# Patient Record
Sex: Female | Born: 1935
Health system: Southern US, Community
[De-identification: ages and names within clinical notes are randomized; demographics above are authoritative.]

## PROBLEM LIST (undated history)

## (undated) DIAGNOSIS — R55 Syncope and collapse: Secondary | ICD-10-CM

## (undated) DIAGNOSIS — I10 Essential (primary) hypertension: Secondary | ICD-10-CM

## (undated) DIAGNOSIS — C50919 Malignant neoplasm of unspecified site of unspecified female breast: Secondary | ICD-10-CM

## (undated) DIAGNOSIS — K5732 Diverticulitis of large intestine without perforation or abscess without bleeding: Secondary | ICD-10-CM

## (undated) DIAGNOSIS — E785 Hyperlipidemia, unspecified: Secondary | ICD-10-CM

## (undated) DIAGNOSIS — K449 Diaphragmatic hernia without obstruction or gangrene: Secondary | ICD-10-CM

## (undated) DIAGNOSIS — M8580 Other specified disorders of bone density and structure, unspecified site: Secondary | ICD-10-CM

## (undated) DIAGNOSIS — I2699 Other pulmonary embolism without acute cor pulmonale: Secondary | ICD-10-CM

## (undated) DIAGNOSIS — M1990 Unspecified osteoarthritis, unspecified site: Secondary | ICD-10-CM

## (undated) DIAGNOSIS — E039 Hypothyroidism, unspecified: Secondary | ICD-10-CM

## (undated) DIAGNOSIS — R918 Other nonspecific abnormal finding of lung field: Secondary | ICD-10-CM

## (undated) DIAGNOSIS — D649 Anemia, unspecified: Secondary | ICD-10-CM

## (undated) HISTORY — PX: BREAST BIOPSY: SHX20

## (undated) HISTORY — DX: Essential (primary) hypertension: I10

## (undated) HISTORY — DX: Diaphragmatic hernia without obstruction or gangrene: K44.9

## (undated) HISTORY — DX: Unspecified osteoarthritis, unspecified site: M19.90

## (undated) HISTORY — DX: Hypothyroidism, unspecified: E03.9

## (undated) HISTORY — DX: Other specified disorders of bone density and structure, unspecified site: M85.80

## (undated) HISTORY — DX: Syncope and collapse: R55

## (undated) HISTORY — DX: Hyperlipidemia, unspecified: E78.5

## (undated) HISTORY — PX: TONSILLECTOMY AND ADENOIDECTOMY: SUR1326

## (undated) HISTORY — DX: Diverticulitis of large intestine without perforation or abscess without bleeding: K57.32

## (undated) HISTORY — DX: Malignant neoplasm of unspecified site of unspecified female breast: C50.919

## (undated) NOTE — *Deleted (*Deleted)
Thoracic Location of Tumor / Histology:  LEFT upper lobe pulmonary nodule   Patient presented with symptoms of:  Patient had a recent hospitalization for intussusception.  Patient found to have a colon lipoma causing intussusception.  She was taken to the operating room for partial colectomy and anastomosis. She had bilateral pulmonary emboli pulmonary was consulted. Patient was trialed on anticoagulation during the hospitalization. Patient was discharged from the hospital on 05/06/2020. In addition she did have bilateral lower extremity DVT.  CT scan of the chest on admission revealed a 16 x 11 mm irregular solid density within the left upper lobe.  Ultimately the patient had hospital follow-up in our clinic at discharge with Elisha Headland, NP.  Recommended continued Eliquis for DVT/PE.  Planned noncontrasted CT chest follow-up to ensure resolution of lung nodule.  She had this repeat noncontrasted CT of the chest on 06/02/2020.  There was persistence of this opacity in the left lung apex measuring 0.9 x 2 cm in size.  Biopsies revealed:  None collected--per Dr. Tonia Brooms will determine at a later date if necessary  Tobacco/Marijuana/Snuff/ETOH use: ***  Past/Anticipated interventions by cardiothoracic surgery, if any:  06/26/2020 Dr. Elige Radon Icard Patient here today for follow-up regarding recent PET scan imaging of abnormal lung nodule.  This was completed and read yesterday.  Looking at the left upper lobe anterior pulmonary nodule is hypermetabolic with a SUV of 4.6.  Additionally there is a small AP window node that metabolic activity is present does not exclude metastatic disease.  Today we reviewed patient's patient's imaging as above.  Otherwise respiratory standpoint she has no complaints. Plan: --We discussed all of the different options today to include biopsy versus consideration for empiric radiation. --I think with the patient's medical history, age and to her overall goals of care I think we  should consider empiric radiation treatment. --Patient is agreeable to this plan if they would consider this from radiation oncology standpoint. --I do believe that if she needed to have a biopsy that she could possibly tolerate this but her son had a biopsy with a pneumothorax for a lung cancer she is a little anxious about having a procedure done. --She is also anxious about having to undergo another procedure at her current age. --Ambulatory referral to radiation oncology was placed.  Past/Anticipated interventions by medical oncology, if any: No referral has been placed at this time  Signs/Symptoms  Weight changes, if any: ***  Respiratory complaints, if any: ***  Hemoptysis, if any: ***  Pain issues, if any:  ***  SAFETY ISSUES:  Prior radiation? ***  Pacemaker/ICD? ***   Possible current pregnancy? No--postmenopausal  Is the patient on methotrexate? ***  Current Complaints / other details:   History of left breast cancer-invasive ductal stage 1-stopped arimidex 2008

---

## 1999-10-10 ENCOUNTER — Emergency Department (HOSPITAL_COMMUNITY): Admission: EM | Admit: 1999-10-10 | Discharge: 1999-10-10 | Payer: Self-pay | Admitting: Internal Medicine

## 1999-10-10 ENCOUNTER — Encounter: Payer: Self-pay | Admitting: Internal Medicine

## 1999-10-23 ENCOUNTER — Other Ambulatory Visit: Admission: RE | Admit: 1999-10-23 | Discharge: 1999-10-23 | Payer: Self-pay | Admitting: *Deleted

## 1999-12-29 ENCOUNTER — Encounter: Payer: Self-pay | Admitting: Emergency Medicine

## 1999-12-29 ENCOUNTER — Emergency Department (HOSPITAL_COMMUNITY): Admission: EM | Admit: 1999-12-29 | Discharge: 1999-12-29 | Payer: Self-pay | Admitting: Emergency Medicine

## 2000-04-05 ENCOUNTER — Encounter: Payer: Self-pay | Admitting: Internal Medicine

## 2000-04-05 ENCOUNTER — Ambulatory Visit (HOSPITAL_COMMUNITY): Admission: RE | Admit: 2000-04-05 | Discharge: 2000-04-05 | Payer: Self-pay | Admitting: Internal Medicine

## 2000-04-09 ENCOUNTER — Encounter: Payer: Self-pay | Admitting: Emergency Medicine

## 2000-04-09 ENCOUNTER — Emergency Department (HOSPITAL_COMMUNITY): Admission: EM | Admit: 2000-04-09 | Discharge: 2000-04-09 | Payer: Self-pay | Admitting: Emergency Medicine

## 2002-03-26 ENCOUNTER — Other Ambulatory Visit: Admission: RE | Admit: 2002-03-26 | Discharge: 2002-03-26 | Payer: Self-pay | Admitting: Radiology

## 2002-03-26 ENCOUNTER — Encounter (INDEPENDENT_AMBULATORY_CARE_PROVIDER_SITE_OTHER): Payer: Self-pay | Admitting: *Deleted

## 2002-03-26 ENCOUNTER — Encounter: Admission: RE | Admit: 2002-03-26 | Discharge: 2002-03-26 | Payer: Self-pay | Admitting: Surgery

## 2002-03-26 ENCOUNTER — Encounter: Payer: Self-pay | Admitting: Surgery

## 2002-04-09 HISTORY — PX: BREAST LUMPECTOMY: SHX2

## 2002-04-10 ENCOUNTER — Encounter: Admission: RE | Admit: 2002-04-10 | Discharge: 2002-04-10 | Payer: Self-pay | Admitting: Surgery

## 2002-04-10 ENCOUNTER — Encounter: Payer: Self-pay | Admitting: Surgery

## 2002-04-11 ENCOUNTER — Ambulatory Visit (HOSPITAL_BASED_OUTPATIENT_CLINIC_OR_DEPARTMENT_OTHER): Admission: RE | Admit: 2002-04-11 | Discharge: 2002-04-11 | Payer: Self-pay | Admitting: Surgery

## 2002-04-11 ENCOUNTER — Encounter: Payer: Self-pay | Admitting: Surgery

## 2002-04-11 ENCOUNTER — Encounter: Admission: RE | Admit: 2002-04-11 | Discharge: 2002-04-11 | Payer: Self-pay | Admitting: Surgery

## 2002-04-11 ENCOUNTER — Encounter (INDEPENDENT_AMBULATORY_CARE_PROVIDER_SITE_OTHER): Payer: Self-pay | Admitting: *Deleted

## 2002-04-27 ENCOUNTER — Ambulatory Visit: Admission: RE | Admit: 2002-04-27 | Discharge: 2002-07-03 | Payer: Self-pay | Admitting: *Deleted

## 2002-08-14 ENCOUNTER — Ambulatory Visit: Admission: RE | Admit: 2002-08-14 | Discharge: 2002-08-14 | Payer: Self-pay | Admitting: *Deleted

## 2003-08-10 DIAGNOSIS — R55 Syncope and collapse: Secondary | ICD-10-CM

## 2003-08-10 HISTORY — DX: Syncope and collapse: R55

## 2003-09-23 ENCOUNTER — Inpatient Hospital Stay (HOSPITAL_COMMUNITY): Admission: EM | Admit: 2003-09-23 | Discharge: 2003-09-24 | Payer: Self-pay | Admitting: Emergency Medicine

## 2004-07-03 ENCOUNTER — Ambulatory Visit: Payer: Self-pay | Admitting: Internal Medicine

## 2004-07-16 ENCOUNTER — Ambulatory Visit: Payer: Self-pay | Admitting: Hematology & Oncology

## 2004-12-21 ENCOUNTER — Ambulatory Visit: Payer: Self-pay | Admitting: Internal Medicine

## 2005-01-15 ENCOUNTER — Ambulatory Visit: Payer: Self-pay | Admitting: Hematology & Oncology

## 2005-08-20 ENCOUNTER — Ambulatory Visit: Payer: Self-pay | Admitting: Hematology & Oncology

## 2006-02-28 ENCOUNTER — Ambulatory Visit: Payer: Self-pay | Admitting: Hematology & Oncology

## 2006-03-04 LAB — COMPREHENSIVE METABOLIC PANEL
ALT: 12 U/L (ref 0–40)
AST: 19 U/L (ref 0–37)
Albumin: 4 g/dL (ref 3.5–5.2)
Calcium: 9.1 mg/dL (ref 8.4–10.5)
Chloride: 101 mEq/L (ref 96–112)
Potassium: 4.2 mEq/L (ref 3.5–5.3)
Total Protein: 6.1 g/dL (ref 6.0–8.3)

## 2006-03-04 LAB — CBC WITH DIFFERENTIAL/PLATELET
BASO%: 0.2 % (ref 0.0–2.0)
Basophils Absolute: 0 10*3/uL (ref 0.0–0.1)
EOS%: 1.4 % (ref 0.0–7.0)
HGB: 14 g/dL (ref 11.6–15.9)
MCH: 31.7 pg (ref 26.0–34.0)
MCHC: 34.2 g/dL (ref 32.0–36.0)
MONO#: 0.6 10*3/uL (ref 0.1–0.9)
RDW: 13.3 % (ref 11.3–14.5)
WBC: 5.5 10*3/uL (ref 3.9–10.0)
lymph#: 1.2 10*3/uL (ref 0.9–3.3)

## 2006-03-07 ENCOUNTER — Ambulatory Visit: Payer: Self-pay | Admitting: Internal Medicine

## 2006-03-08 ENCOUNTER — Encounter: Admission: RE | Admit: 2006-03-08 | Discharge: 2006-03-08 | Payer: Self-pay | Admitting: Internal Medicine

## 2006-06-14 ENCOUNTER — Ambulatory Visit: Payer: Self-pay | Admitting: Internal Medicine

## 2006-06-14 LAB — CONVERTED CEMR LAB
HDL: 49.5 mg/dL (ref >39.0)
Triglyceride fasting, serum: 77 mg/dL (ref 0–149)

## 2006-08-30 ENCOUNTER — Ambulatory Visit: Payer: Self-pay | Admitting: Hematology & Oncology

## 2006-10-06 LAB — CBC WITH DIFFERENTIAL/PLATELET
BASO%: 0.1 % (ref 0.0–2.0)
EOS%: 1.1 % (ref 0.0–7.0)
MCH: 31.7 pg (ref 26.0–34.0)
MCHC: 34.9 g/dL (ref 32.0–36.0)
NEUT%: 67.6 % (ref 39.6–76.8)
RBC: 4.46 10*6/uL (ref 3.70–5.32)
RDW: 13.3 % (ref 11.3–14.5)
lymph#: 1.2 10*3/uL (ref 0.9–3.3)

## 2006-10-06 LAB — COMPREHENSIVE METABOLIC PANEL
ALT: 11 U/L (ref 0–35)
AST: 18 U/L (ref 0–37)
Calcium: 9.4 mg/dL (ref 8.4–10.5)
Chloride: 106 mEq/L (ref 96–112)
Creatinine, Ser: 0.73 mg/dL (ref 0.40–1.20)
Potassium: 3.8 mEq/L (ref 3.5–5.3)
Sodium: 144 mEq/L (ref 135–145)

## 2007-01-30 DIAGNOSIS — E785 Hyperlipidemia, unspecified: Secondary | ICD-10-CM | POA: Insufficient documentation

## 2007-01-30 DIAGNOSIS — I10 Essential (primary) hypertension: Secondary | ICD-10-CM

## 2007-01-30 DIAGNOSIS — E039 Hypothyroidism, unspecified: Secondary | ICD-10-CM

## 2007-01-30 DIAGNOSIS — M1990 Unspecified osteoarthritis, unspecified site: Secondary | ICD-10-CM | POA: Insufficient documentation

## 2007-06-09 ENCOUNTER — Encounter (INDEPENDENT_AMBULATORY_CARE_PROVIDER_SITE_OTHER): Payer: Self-pay | Admitting: *Deleted

## 2007-06-13 ENCOUNTER — Encounter: Payer: Self-pay | Admitting: Internal Medicine

## 2007-06-23 ENCOUNTER — Encounter (INDEPENDENT_AMBULATORY_CARE_PROVIDER_SITE_OTHER): Payer: Self-pay | Admitting: *Deleted

## 2007-09-21 ENCOUNTER — Telehealth (INDEPENDENT_AMBULATORY_CARE_PROVIDER_SITE_OTHER): Payer: Self-pay | Admitting: *Deleted

## 2007-10-03 ENCOUNTER — Ambulatory Visit: Payer: Self-pay | Admitting: Hematology & Oncology

## 2007-10-05 ENCOUNTER — Encounter: Payer: Self-pay | Admitting: Internal Medicine

## 2007-10-05 LAB — COMPREHENSIVE METABOLIC PANEL
ALT: 9 U/L (ref 0–35)
AST: 18 U/L (ref 0–37)
Albumin: 4.1 g/dL (ref 3.5–5.2)
BUN: 14 mg/dL (ref 6–23)
Calcium: 9.4 mg/dL (ref 8.4–10.5)
Chloride: 100 mEq/L (ref 96–112)
Potassium: 3.9 mEq/L (ref 3.5–5.3)
Total Protein: 6.4 g/dL (ref 6.0–8.3)

## 2007-10-05 LAB — CBC WITH DIFFERENTIAL/PLATELET
Basophils Absolute: 0 10*3/uL (ref 0.0–0.1)
EOS%: 1.8 % (ref 0.0–7.0)
HGB: 14.4 g/dL (ref 11.6–15.9)
MCH: 32 pg (ref 26.0–34.0)
NEUT#: 3.6 10*3/uL (ref 1.5–6.5)
RDW: 13.3 % (ref 11.3–14.5)
lymph#: 1.5 10*3/uL (ref 0.9–3.3)

## 2007-10-20 ENCOUNTER — Ambulatory Visit: Payer: Self-pay | Admitting: Internal Medicine

## 2007-10-20 DIAGNOSIS — M810 Age-related osteoporosis without current pathological fracture: Secondary | ICD-10-CM | POA: Insufficient documentation

## 2007-10-20 DIAGNOSIS — Z853 Personal history of malignant neoplasm of breast: Secondary | ICD-10-CM

## 2007-10-24 ENCOUNTER — Encounter (INDEPENDENT_AMBULATORY_CARE_PROVIDER_SITE_OTHER): Payer: Self-pay | Admitting: *Deleted

## 2007-10-26 ENCOUNTER — Ambulatory Visit: Payer: Self-pay | Admitting: Internal Medicine

## 2007-10-31 LAB — CONVERTED CEMR LAB
ALT: 12 units/L (ref 0–35)
AST: 21 units/L (ref 0–37)
BUN: 10 mg/dL (ref 6–23)
Basophils Relative: 0.5 % (ref 0.0–1.0)
Calcium: 9.1 mg/dL (ref 8.4–10.5)
Chloride: 101 meq/L (ref 96–112)
Eosinophils Absolute: 0.1 10*3/uL (ref 0.0–0.6)
Eosinophils Relative: 1.1 % (ref 0.0–5.0)
GFR calc non Af Amer: 86 mL/min
Glucose, Bld: 90 mg/dL (ref 70–99)
MCV: 94.1 fL (ref 78.0–100.0)
Monocytes Relative: 10.1 % (ref 3.0–11.0)
Platelets: 240 10*3/uL (ref 150–400)
RBC: 4.71 M/uL (ref 3.87–5.11)
TSH: 4.63 microintl units/mL (ref 0.35–5.50)
Total CHOL/HDL Ratio: 3.3
Triglycerides: 77 mg/dL (ref 0–149)
VLDL: 15 mg/dL (ref 0–40)
WBC: 5.7 10*3/uL (ref 4.5–10.5)

## 2007-11-03 ENCOUNTER — Encounter: Payer: Self-pay | Admitting: Internal Medicine

## 2007-11-03 ENCOUNTER — Ambulatory Visit: Payer: Self-pay | Admitting: Internal Medicine

## 2007-12-03 ENCOUNTER — Encounter (INDEPENDENT_AMBULATORY_CARE_PROVIDER_SITE_OTHER): Payer: Self-pay | Admitting: *Deleted

## 2007-12-06 ENCOUNTER — Telehealth (INDEPENDENT_AMBULATORY_CARE_PROVIDER_SITE_OTHER): Payer: Self-pay | Admitting: *Deleted

## 2007-12-11 ENCOUNTER — Ambulatory Visit: Payer: Self-pay | Admitting: Internal Medicine

## 2007-12-11 DIAGNOSIS — M549 Dorsalgia, unspecified: Secondary | ICD-10-CM | POA: Insufficient documentation

## 2008-01-22 ENCOUNTER — Telehealth (INDEPENDENT_AMBULATORY_CARE_PROVIDER_SITE_OTHER): Payer: Self-pay | Admitting: *Deleted

## 2008-02-21 ENCOUNTER — Ambulatory Visit: Payer: Self-pay | Admitting: Internal Medicine

## 2008-02-22 ENCOUNTER — Telehealth (INDEPENDENT_AMBULATORY_CARE_PROVIDER_SITE_OTHER): Payer: Self-pay | Admitting: *Deleted

## 2008-02-23 ENCOUNTER — Encounter (INDEPENDENT_AMBULATORY_CARE_PROVIDER_SITE_OTHER): Payer: Self-pay | Admitting: *Deleted

## 2008-02-26 ENCOUNTER — Ambulatory Visit: Payer: Self-pay | Admitting: Internal Medicine

## 2008-02-26 LAB — CONVERTED CEMR LAB: OCCULT 3: NEGATIVE

## 2008-02-27 ENCOUNTER — Encounter (INDEPENDENT_AMBULATORY_CARE_PROVIDER_SITE_OTHER): Payer: Self-pay | Admitting: *Deleted

## 2008-06-13 ENCOUNTER — Encounter: Payer: Self-pay | Admitting: Internal Medicine

## 2008-06-26 ENCOUNTER — Encounter (INDEPENDENT_AMBULATORY_CARE_PROVIDER_SITE_OTHER): Payer: Self-pay | Admitting: *Deleted

## 2008-08-29 ENCOUNTER — Ambulatory Visit: Payer: Self-pay | Admitting: Internal Medicine

## 2008-09-02 ENCOUNTER — Telehealth: Payer: Self-pay | Admitting: Internal Medicine

## 2008-09-02 LAB — CONVERTED CEMR LAB
ALT: 13 units/L (ref 0–35)
AST: 23 units/L (ref 0–37)
Chloride: 103 meq/L (ref 96–112)
Direct LDL: 151.9 mg/dL
GFR calc Af Amer: 89 mL/min
GFR calc non Af Amer: 74 mL/min
HDL: 60.7 mg/dL (ref >39.0)
Hemoglobin: 15.1 g/dL — ABNORMAL HIGH (ref 12.0–15.0)
Potassium: 3.8 meq/L (ref 3.5–5.1)
Sodium: 142 meq/L (ref 135–145)
Total CHOL/HDL Ratio: 3.6
VLDL: 14 mg/dL (ref 0–40)

## 2008-10-12 ENCOUNTER — Telehealth: Payer: Self-pay | Admitting: Internal Medicine

## 2008-10-12 ENCOUNTER — Ambulatory Visit: Payer: Self-pay | Admitting: Internal Medicine

## 2008-10-14 ENCOUNTER — Telehealth (INDEPENDENT_AMBULATORY_CARE_PROVIDER_SITE_OTHER): Payer: Self-pay | Admitting: *Deleted

## 2008-11-11 ENCOUNTER — Ambulatory Visit: Payer: Self-pay | Admitting: Hematology & Oncology

## 2008-11-13 ENCOUNTER — Encounter: Payer: Self-pay | Admitting: Internal Medicine

## 2008-11-13 LAB — CBC WITH DIFFERENTIAL (CANCER CENTER ONLY)
BASO#: 0 10*3/uL (ref 0.0–0.2)
EOS%: 1.9 % (ref 0.0–7.0)
HGB: 14.2 g/dL (ref 11.6–15.9)
MCH: 31.6 pg (ref 26.0–34.0)
MCHC: 33.5 g/dL (ref 32.0–36.0)
MONO%: 8.6 % (ref 0.0–13.0)
NEUT#: 3.2 10*3/uL (ref 1.5–6.5)
NEUT%: 65.5 % (ref 39.6–80.0)

## 2008-11-13 LAB — COMPREHENSIVE METABOLIC PANEL
Albumin: 3.9 g/dL (ref 3.5–5.2)
BUN: 11 mg/dL (ref 6–23)
Calcium: 9 mg/dL (ref 8.4–10.5)
Chloride: 101 mEq/L (ref 96–112)
Glucose, Bld: 79 mg/dL (ref 70–99)
Potassium: 3.8 mEq/L (ref 3.5–5.3)

## 2009-03-03 ENCOUNTER — Ambulatory Visit: Payer: Self-pay | Admitting: Internal Medicine

## 2009-03-04 ENCOUNTER — Encounter (INDEPENDENT_AMBULATORY_CARE_PROVIDER_SITE_OTHER): Payer: Self-pay | Admitting: *Deleted

## 2009-03-06 LAB — CONVERTED CEMR LAB
Basophils Absolute: 0.1 10*3/uL (ref 0.0–0.1)
Basophils Relative: 1.1 % (ref 0.0–3.0)
CO2: 32 meq/L (ref 19–32)
Calcium: 9.1 mg/dL (ref 8.4–10.5)
Chloride: 104 meq/L (ref 96–112)
Creatinine, Ser: 0.8 mg/dL (ref 0.4–1.2)
Eosinophils Absolute: 0.1 10*3/uL (ref 0.0–0.7)
Glucose, Bld: 86 mg/dL (ref 70–99)
HDL: 54 mg/dL (ref >39.00)
Lymphocytes Relative: 26.9 % (ref 12.0–46.0)
MCHC: 34.3 g/dL (ref 30.0–36.0)
MCV: 92.3 fL (ref 78.0–100.0)
Monocytes Absolute: 0.5 10*3/uL (ref 0.1–1.0)
Neutro Abs: 2.7 10*3/uL (ref 1.4–7.7)
Neutrophils Relative %: 59.6 % (ref 43.0–77.0)
RBC: 4.36 M/uL (ref 3.87–5.11)
RDW: 12.8 % (ref 11.5–14.6)
TSH: 2.09 microintl units/mL (ref 0.35–5.50)
Total CHOL/HDL Ratio: 3
Triglycerides: 56 mg/dL (ref 0.0–149.0)

## 2009-03-07 ENCOUNTER — Ambulatory Visit: Payer: Self-pay | Admitting: Internal Medicine

## 2009-03-14 ENCOUNTER — Encounter (INDEPENDENT_AMBULATORY_CARE_PROVIDER_SITE_OTHER): Payer: Self-pay | Admitting: *Deleted

## 2009-03-14 LAB — CONVERTED CEMR LAB: Fecal Occult Bld: NEGATIVE

## 2009-06-20 ENCOUNTER — Encounter: Payer: Self-pay | Admitting: Internal Medicine

## 2009-07-07 ENCOUNTER — Ambulatory Visit: Payer: Self-pay | Admitting: Internal Medicine

## 2009-07-10 LAB — CONVERTED CEMR LAB
Calcium: 9.2 mg/dL (ref 8.4–10.5)
GFR calc non Af Amer: 85.74 mL/min (ref >60)
Glucose, Bld: 83 mg/dL (ref 70–99)
Potassium: 4 meq/L (ref 3.5–5.1)
Sodium: 140 meq/L (ref 135–145)

## 2009-11-10 ENCOUNTER — Ambulatory Visit: Payer: Self-pay | Admitting: Hematology & Oncology

## 2009-11-12 ENCOUNTER — Encounter: Payer: Self-pay | Admitting: Internal Medicine

## 2009-11-12 LAB — CBC WITH DIFFERENTIAL (CANCER CENTER ONLY)
BASO#: 0 10*3/uL (ref 0.0–0.2)
Eosinophils Absolute: 0.1 10*3/uL (ref 0.0–0.5)
HCT: 41.1 % (ref 34.8–46.6)
HGB: 13.3 g/dL (ref 11.6–15.9)
LYMPH#: 1.3 10*3/uL (ref 0.9–3.3)
LYMPH%: 26.7 % (ref 14.0–48.0)
MCV: 89 fL (ref 81–101)
MONO#: 0.4 10*3/uL (ref 0.1–0.9)
NEUT%: 63.1 % (ref 39.6–80.0)
WBC: 5 10*3/uL (ref 3.9–10.0)

## 2009-11-12 LAB — VITAMIN D 25 HYDROXY (VIT D DEFICIENCY, FRACTURES): Vit D, 25-Hydroxy: 27 ng/mL — ABNORMAL LOW (ref 30–89)

## 2009-11-12 LAB — COMPREHENSIVE METABOLIC PANEL
ALT: 8 U/L (ref 0–35)
BUN: 13 mg/dL (ref 6–23)
CO2: 28 mEq/L (ref 19–32)
Calcium: 9.1 mg/dL (ref 8.4–10.5)
Chloride: 101 mEq/L (ref 96–112)
Creatinine, Ser: 0.75 mg/dL (ref 0.40–1.20)
Glucose, Bld: 79 mg/dL (ref 70–99)
Total Bilirubin: 0.4 mg/dL (ref 0.3–1.2)

## 2010-01-06 ENCOUNTER — Telehealth (INDEPENDENT_AMBULATORY_CARE_PROVIDER_SITE_OTHER): Payer: Self-pay | Admitting: *Deleted

## 2010-01-06 ENCOUNTER — Ambulatory Visit: Payer: Self-pay | Admitting: Internal Medicine

## 2010-01-26 ENCOUNTER — Ambulatory Visit: Payer: Self-pay | Admitting: Internal Medicine

## 2010-01-26 ENCOUNTER — Encounter: Payer: Self-pay | Admitting: Internal Medicine

## 2010-03-09 ENCOUNTER — Ambulatory Visit: Payer: Self-pay | Admitting: Internal Medicine

## 2010-03-09 LAB — CONVERTED CEMR LAB
ALT: 13 units/L (ref 0–35)
BUN: 12 mg/dL (ref 6–23)
Basophils Absolute: 0 10*3/uL (ref 0.0–0.1)
Calcium: 9.2 mg/dL (ref 8.4–10.5)
Chloride: 103 meq/L (ref 96–112)
Creatinine, Ser: 0.7 mg/dL (ref 0.4–1.2)
Eosinophils Relative: 1 % (ref 0.0–5.0)
HDL: 61.6 mg/dL (ref >39.00)
Hemoglobin: 14.9 g/dL (ref 12.0–15.0)
LDL Cholesterol: 94 mg/dL (ref 0–99)
Lymphocytes Relative: 20.8 % (ref 12.0–46.0)
Monocytes Relative: 9.3 % (ref 3.0–12.0)
Platelets: 236 10*3/uL (ref 150.0–400.0)
RDW: 14.8 % — ABNORMAL HIGH (ref 11.5–14.6)
TSH: 1.5 microintl units/mL (ref 0.35–5.50)
Total CHOL/HDL Ratio: 3
Triglycerides: 71 mg/dL (ref 0.0–149.0)
Vit D, 25-Hydroxy: 42 ng/mL (ref 30–89)
WBC: 5.8 10*3/uL (ref 4.5–10.5)

## 2010-03-10 ENCOUNTER — Telehealth: Payer: Self-pay | Admitting: Internal Medicine

## 2010-03-11 ENCOUNTER — Encounter (INDEPENDENT_AMBULATORY_CARE_PROVIDER_SITE_OTHER): Payer: Self-pay | Admitting: *Deleted

## 2010-03-16 ENCOUNTER — Ambulatory Visit: Payer: Self-pay | Admitting: Internal Medicine

## 2010-05-28 ENCOUNTER — Ambulatory Visit: Payer: Self-pay | Admitting: Internal Medicine

## 2010-06-22 ENCOUNTER — Encounter: Payer: Self-pay | Admitting: Internal Medicine

## 2010-06-23 ENCOUNTER — Encounter: Payer: Self-pay | Admitting: Internal Medicine

## 2010-08-31 ENCOUNTER — Ambulatory Visit
Admission: RE | Admit: 2010-08-31 | Discharge: 2010-08-31 | Payer: Self-pay | Source: Home / Self Care | Attending: Family Medicine | Admitting: Family Medicine

## 2010-09-06 LAB — CONVERTED CEMR LAB: TSH: 4.91 microintl units/mL (ref 0.35–5.50)

## 2010-09-08 NOTE — Assessment & Plan Note (Signed)
Summary: CPX   //lch   Vital Signs:  Patient profile:   77 year old female Weight:      148.25 pounds Pulse rate:   85 / minute Pulse rhythm:   regular BP sitting:   134 / 82  (left arm) Cuff size:   regular  Vitals Entered By: Army Fossa CMA (March 09, 2010 8:43 AM) CC: Pt here for Follow up visit: fasting   History of Present Illness: Here for Medicare AWV:  1.   Risk factors based on Past M, S, F history: yes  2.   Physical Activities: active , independent on ADL, still drives  3.   Depression/mood: denies depressive moods 4.   Hearing: R hearing decreased , has not seen the specialist in a while , does not feel that needs evaluation 5.   ADL's: independent on all her ADLs 6.   Fall Risk: no recent falls, low risk  7.   Home Safety: feels safe at home , has  fire detectors  8.   Height, weight, &visual acuity:see VS, no problems with vision 9.   Counseling: provided , see below 10.   Labs ordered based on risk factors: yes  11.           Referral Coordination 12.           Care Plan--- see A/P  13.            Cognitive Assessment :  memory, alertness and motor skill seems appropriate for age   Hyperlipidemia-- tolerates crestor well, off simvastatin x weeks and aches and pains are  decreased  Hypertension-- no ambulatory BPs good med compliance  Osteoarthritis-- aches and pains  better since  simvastatin was switched to Crestor  Osteopenia-- good medication compliance w/  fosamax w/o s/e      Current Medications (verified): 1)  Hydrochlorothiazide 25 Mg Tabs (Hydrochlorothiazide) .... Take One Tablet Daily 2)  Levothyroxine Sodium 75 Mcg Tabs (Levothyroxine Sodium) .Marland Kitchen.. 1 By Mouth Qd 3)  Crestor 10 Mg Tabs (Rosuvastatin Calcium) .Marland Kitchen.. 1 By Mouth Once Daily 4)  Alendronate Sodium 70 Mg Tabs (Alendronate Sodium) .Marland Kitchen.. 1 By Mouth Qwk 5)  Aspir-Low 81 Mg  Tbec (Aspirin) 6)  Vit D 1000 To 1200 U Daily  and  Calcium Every Day 7)  Tylenol Extra Strength 500 Mg Tabs  (Acetaminophen) .Marland Kitchen.. 1 or 2 Every 6 Hours As Needed For Pain 8)  Fish Oil 9)  Turmeric  Allergies: 1)  ! Motrin  Past History:  Past Medical History: Hyperlipidemia Hypertension Hypothyroidism Osteoarthritis Hiatal Hernia Left breast-invasive ductal ca stage 1-- stopped arimidex 2008 Diverticulosis, colon Syncope 2005: CT head (-), ECHO essent.  neg, stress test (-), carotid u/s (-) Osteopenia DEXA 7-02 Cscope 2002, TICs next 2012  Past Surgical History: Reviewed history from 01/30/2007 and no changes required. T&A Vaginal deliveries x 2 Colonoscopy-04/06/2001 DXA 02/2001 ABI 03/26/2002 Lumpectomy- Left breast-04/2002  Family History: Reviewed history from 07/07/2009 and no changes required. mother pass away  age 3  Diabetes -- M . dx late in life  MI-- B   at age 41 CVA-- F , age 45 . sister 42 colon ca--no Breast ca--no  Social History: Reviewed history from 03/03/2009 and no changes required. son lives w/ her Mikael Spray , one of my patients) Widow since 1993 still drives ADL independent Never Smoked Alcohol use-no  Review of Systems CV:  Denies chest pain or discomfort and swelling of feet. Resp:  Denies shortness  of breath; occasionally coughs. GI:  Denies bloody stools, diarrhea, nausea, and vomiting; no heartburn. GU:  no vag bleed  does not do SBE. Psych:  Denies anxiety and depression.  Physical Exam  General:  alert, well-developed, and well-nourished.   Neck:  no masses, no thyromegaly, and normal carotid upstroke.   Breasts:  No mass, nodules, thickening, tenderness, bulging, retraction, inflamation, nipple discharge or skin changes noted.  surgical scar on the left breast noted Lungs:  normal respiratory effort, no intercostal retractions, no accessory muscle use, and normal breath sounds.   Heart:  normal rate, regular rhythm, and no murmur.   Abdomen:  soft, non-tender, no distention, no masses, no guarding, and no rigidity.   Extremities:   trace  edema superficial varicosities L>R calf tender to palpation throughout w/o redness (at baseline) Psych:  Oriented X3, good eye contact, not anxious appearing, and not depressed appearing.     Impression & Recommendations:  Problem # 1:  HEALTH SCREENING (ICD-V70.0)  Td 2008 pneumonia shot 02-2008 discussed  shingles shot , will think about it   MMG 11-10 neg  PAP 2001 (-) , no h/o hysterectomy, no h/o abnormal PAPs ----> pt not interested on further PAPs, she is low risk SBE encouraged   Cscope  2002, was rec next 2012 neg  iFOB 7-10, another kit provided  Diet and exercise discussed  Orders: First annual wellness visit with prevention plan  (Z6109)  Problem # 2:  OSTEOPENIA (ICD-733.90) tolerating alendronate well Her updated medication list for this problem includes:    Alendronate Sodium 70 Mg Tabs (Alendronate sodium) .Marland Kitchen... 1 by mouth qwk  Orders: T-Vitamin D (25-Hydroxy) (60454-09811) Specimen Handling (91478)  Problem # 3:  HYPOTHYROIDISM (ICD-244.9)  checking a TSH Her updated medication list for this problem includes:    Levothyroxine Sodium 75 Mcg Tabs (Levothyroxine sodium) .Marland Kitchen... 1 by mouth qd  Labs Reviewed: TSH: 1.74 (07/07/2009)    Chol: 168 (03/03/2009)   HDL: 54.00 (03/03/2009)   LDL: 103 (03/03/2009)   TG: 56.0 (03/03/2009)  Orders: TLB-TSH (Thyroid Stimulating Hormone) (84443-TSH) Specimen Handling (29562)  Problem # 4:  HYPERTENSION (ICD-401.9) at goal Her updated medication list for this problem includes:    Hydrochlorothiazide 25 Mg Tabs (Hydrochlorothiazide) .Marland Kitchen... Take one tablet daily  Orders: Venipuncture (13086) TLB-BMP (Basic Metabolic Panel-BMET) (80048-METABOL) TLB-CBC Platelet - w/Differential (85025-CBCD) Specimen Handling (57846)  BP today: 134/82 Prior BP: 124/82 (01/06/2010)  Labs Reviewed: K+: 4.0 (07/07/2009) Creat: : 0.7 (07/07/2009)   Chol: 168 (03/03/2009)   HDL: 54.00 (03/03/2009)   LDL: 103 (03/03/2009)    TG: 56.0 (03/03/2009)  Problem # 5:  HYPERLIPIDEMIA (ICD-272.4) off simvastatin, aches and pains better. Continue with crestor  Labs Her updated medication list for this problem includes:    Crestor 10 Mg Tabs (Rosuvastatin calcium) .Marland Kitchen... 1 by mouth once daily  Orders: TLB-ALT (SGPT) (84460-ALT) TLB-AST (SGOT) (84450-SGOT) TLB-Lipid Panel (80061-LIPID)  Complete Medication List: 1)  Hydrochlorothiazide 25 Mg Tabs (Hydrochlorothiazide) .... Take one tablet daily 2)  Levothyroxine Sodium 75 Mcg Tabs (Levothyroxine sodium) .Marland Kitchen.. 1 by mouth qd 3)  Crestor 10 Mg Tabs (Rosuvastatin calcium) .Marland Kitchen.. 1 by mouth once daily 4)  Alendronate Sodium 70 Mg Tabs (Alendronate sodium) .Marland Kitchen.. 1 by mouth qwk 5)  Aspir-low 81 Mg Tbec (Aspirin) 6)  Vit D 1000 To 1200 U Daily and Calcium Every Day  7)  Tylenol Extra Strength 500 Mg Tabs (Acetaminophen) .Marland Kitchen.. 1 or 2 every 6 hours as needed for pain 8)  Fish  Oil  9)  Turmeric   Patient Instructions: 1)  Please schedule a follow-up appointment in 6 months .

## 2010-09-08 NOTE — Letter (Signed)
[facility]   Imported By: Lanelle Bal 12/05/2009 11:37:48  _____________________________________________________________________  External Attachment:    Type:   Image     Comment:   External Document
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
## 2010-09-08 NOTE — Miscellaneous (Signed)
Summary: BONE DENSITY  Clinical Lists Changes  Orders: Added new Test order of T-Bone Densitometry (77080) - Signed Added new Test order of T-Lumbar Vertebral Assessment (77082) - Signed 

## 2010-09-08 NOTE — Progress Notes (Signed)
Summary: Refill Request  Phone Note Refill Request Call back at [phone] Message from:  Pharmacy on Jan 06, 2010 12:35 PM  Refills Requested: Medication #1:  ALENDRONATE SODIUM 70 MG TABS 1 by mouth qwk   Dosage confirmed as above?Dosage Confirmed   Supply Requested: 1 month   Last Refilled: 12/10/2009 [facility] on [address]  Next Appointment Scheduled: 8.1.11 Initial call taken by: Harold Barban,  Jan 06, 2010 12:35 PM    Prescriptions: ALENDRONATE SODIUM 70 MG TABS (ALENDRONATE SODIUM) 1 by mouth qwk  #4 Each x 2   Entered by:   Kandice Hams   Authorized by:   Nolon Rod. Paz MD   Signed by:   Kandice Hams on 01/06/2010   Method used:   Faxed to ...       [facility] [phone]* (retail)       [address]       [place], [place]  [address]       Ph: [phone]       Fax: [phone]   RxID:   [id number]

## 2010-09-08 NOTE — Assessment & Plan Note (Signed)
Summary: 6 mth fu/ns/kdc   Vital Signs:  Patient profile:   77 year old female Height:      62.5 inches Weight:      149.4 pounds O2 Sat:      95 % on Room air Pulse rate:   89 / minute BP sitting:   124 / 82  Vitals Entered By: Shary Decamp (Jan 06, 2010 9:57 AM)  O2 Flow:  Room air CC: rov, pt is only taking simvastatin 3-4 times a week c/o of "hurting so bad in knees, hip"   History of Present Illness: ROV  Hyperlipidemia--  has decreased dose of statin d/t pain in the joints Pain is mostly at the hips and knees, no worse with walking but worse when she "turns in  bed" Denies fevers, headaches, shoulder pain Thinks that the pain has decreased after lowering the dose of statins No myalgias per se  Hypertension-- no ambulatory BPs  , good medication compliance   Hypothyroidism-- good medication compliance    Left breast-invasive ductal cancer --seen by oncology 11-12-09, note reviewed, stable, followup in one year    Current Medications (verified): 1)  Hydrochlorothiazide 25 Mg Tabs (Hydrochlorothiazide) .... Take One Tablet Daily 2)  Levothyroxine Sodium 75 Mcg Tabs (Levothyroxine Sodium) .Marland Kitchen.. 1 By Mouth Qd 3)  Simvastatin 80 Mg Tabs (Simvastatin) .Marland Kitchen.. 1 By Mouth Qd 4)  Alendronate Sodium 70 Mg Tabs (Alendronate Sodium) .Marland Kitchen.. 1 By Mouth Qwk 5)  Aspir-Low 81 Mg  Tbec (Aspirin) 6)  Vit D 1000 To 1200 U Daily  and  Calcium Every Day 7)  Tylenol 325 Mg Tabs (Acetaminophen) 8)  Fish Oil 9)  Turmeric  Allergies (verified): 1)  ! Motrin  Social History: Reviewed history from 03/03/2009 and no changes required. son lives w/ her Mikael Spray , one of my patients) Widow since 1993 still drives ADL independent Never Smoked Alcohol use-no  Review of Systems General:  admits to high fat diet . CV:  Denies chest pain or discomfort and swelling of feet. Resp:  Denies cough and shortness of breath. GI:  Denies bloody stools, diarrhea, nausea, and vomiting.  Physical  Exam  General:  alert and well-developed.   Lungs:  normal respiratory effort, no intercostal retractions, no accessory muscle use, and normal breath sounds.   Heart:  normal rate, regular rhythm, and no murmur.   Msk:  not tender to palpation at either trochanteric bursa Extremities:  trace  edema superficial varicosities L>R calf tender to palpation throughout w/o redness (at baseline) Psych:  not anxious appearing and not depressed appearing.     Impression & Recommendations:  Problem # 1:  HYPERLIPIDEMIA (ICD-272.4) see HPI, unclear if pain related to statins or OA Plan: Hold simvastatin Crestor 10 mg daily, samples provided Her updated medication list for this problem includes:    Crestor 10 Mg Tabs (Rosuvastatin calcium) .Marland Kitchen... 1 by mouth once daily  Labs Reviewed: SGOT: 24 (07/07/2009)   SGPT: 13 (07/07/2009)   HDL:54.00 (03/03/2009), 60.7 (08/29/2008)  LDL:103 (03/03/2009), DEL (08/29/2008)  Chol:168 (03/03/2009), 221 (08/29/2008)  Trig:56.0 (03/03/2009), 70 (08/29/2008)  Orders: Prescription Created Electronically [id number])  Problem # 2:  HYPERTENSION (ICD-401.9) at goal  Her updated medication list for this problem includes:    Hydrochlorothiazide 25 Mg Tabs (Hydrochlorothiazide) .Marland Kitchen... Take one tablet daily  BP today: 124/82 Prior BP: 128/70 (07/07/2009)  Labs Reviewed: K+: 4.0 (07/07/2009) Creat: : 0.7 (07/07/2009)   Chol: 168 (03/03/2009)   HDL: 54.00 (03/03/2009)   LDL: 103 (03/03/2009)  TG: 56.0 (03/03/2009)  Problem # 3:  HYPOTHYROIDISM (ICD-244.9) no change Her updated medication list for this problem includes:    Levothyroxine Sodium 75 Mcg Tabs (Levothyroxine sodium) .Marland Kitchen... 1 by mouth qd  Labs Reviewed: TSH: 1.74 (07/07/2009)    Chol: 168 (03/03/2009)   HDL: 54.00 (03/03/2009)   LDL: 103 (03/03/2009)   TG: 56.0 (03/03/2009)  Problem # 4:  OSTEOARTHRITIS (ICD-715.90) see HPI, will encourage routine Tylenol use, seen instructions.   Her updated  medication list for this problem includes:    Aspir-low 81 Mg Tbec (Aspirin)    Tylenol Extra Strength 500 Mg Tabs (Acetaminophen) .Marland Kitchen... 1 or 2 every 6 hours as needed for pain  Complete Medication List: 1)  Hydrochlorothiazide 25 Mg Tabs (Hydrochlorothiazide) .... Take one tablet daily 2)  Levothyroxine Sodium 75 Mcg Tabs (Levothyroxine sodium) .Marland Kitchen.. 1 by mouth qd 3)  Crestor 10 Mg Tabs (Rosuvastatin calcium) .Marland Kitchen.. 1 by mouth once daily 4)  Alendronate Sodium 70 Mg Tabs (Alendronate sodium) .Marland Kitchen.. 1 by mouth qwk 5)  Aspir-low 81 Mg Tbec (Aspirin) 6)  Vit D 1000 To 1200 U Daily and Calcium Every Day  7)  Tylenol Extra Strength 500 Mg Tabs (Acetaminophen) .Marland Kitchen.. 1 or 2 every 6 hours as needed for pain 8)  Fish Oil  9)  Turmeric   Patient Instructions: 1)  discontinue simvastatin 2)  Start Crestor 10 mg one daily 3)  Please come back in 6-8 weeks, fasting, yearly checkup. 4)  Take tylenol 500 mg  1 or 2 tabs every 6 hours as needed for relief of pain. Avoid taking more than 4000 mg in a 24 hour period (can cause liver damage in higher doses).  Prescriptions: CRESTOR 10 MG TABS (ROSUVASTATIN CALCIUM) 1 by mouth once daily  #30 x 6   Entered and Authorized by:   Elita Quick E. Mickeal Daws MD   Signed by:   Nolon Rod. Hila Bolding MD on 01/06/2010   Method used:   Electronically to        [facility] [phone]* (retail)       [address]       [place], [place]  [address]       Ph: [phone]       Fax: [phone]   RxID:   [id number] CRESTOR 10 MG TABS (ROSUVASTATIN CALCIUM) 1 by mouth once daily  #30 x 6   Entered and Authorized by:   Nolon Rod. Jillane Po MD   Signed by:   Nolon Rod. Haset Oaxaca MD on 01/06/2010   Method used:   Print then Give to Patient   RxID:   [id number]

## 2010-09-08 NOTE — Letter (Signed)
Summary: [facility]: Immunoassay Fecal Occult Blood (iFOB) Order Form  [facility] [place]  [address] [place], [place] [address]   Phone: [phone]  Fax: [phone]      [facility]: Immunoassay Fecal Occult Blood (iFOB) Order Form   March 11, 2010 MRN: [id number]   Ariel Medina 04-11-1928   Physicican Name:______jose paz,md___________________  Diagnosis Code:_______v76.51___________________      Army Fossa CMA

## 2010-09-09 ENCOUNTER — Ambulatory Visit (INDEPENDENT_AMBULATORY_CARE_PROVIDER_SITE_OTHER): Payer: Medicare Other | Admitting: Internal Medicine

## 2010-09-09 ENCOUNTER — Encounter: Payer: Self-pay | Admitting: Internal Medicine

## 2010-09-09 ENCOUNTER — Ambulatory Visit: Admit: 2010-09-09 | Payer: Self-pay | Admitting: Internal Medicine

## 2010-09-09 DIAGNOSIS — E039 Hypothyroidism, unspecified: Secondary | ICD-10-CM

## 2010-09-09 DIAGNOSIS — E785 Hyperlipidemia, unspecified: Secondary | ICD-10-CM

## 2010-09-09 DIAGNOSIS — I10 Essential (primary) hypertension: Secondary | ICD-10-CM

## 2010-09-16 NOTE — Assessment & Plan Note (Signed)
Summary: muscle spasms/kn paz pt needed early appt/kn   Vital Signs:  Patient profile:   77 year old female Height:      62.5 inches Weight:      151 pounds O2 Sat:      95 % on Room air Temp:     97.3 degrees F oral Pulse rate:   81 / minute BP sitting:   100 / 68  (left arm)  Vitals Entered By: Jeremy Johann CMA (August 31, 2010 9:17 AM)  O2 Flow:  Room air CC: muscle spasm in back x 2 days   History of Present Illness: 77 yo woman here today for muscle spasm.  this is not new for pt.  Saturday was cleaning and by Saturday evening was 'hurting so bad'.  reports that hydrocodone 'makes me goofy'- took 3 tabs on Saturday.  yesterday took 1/2 tab w/out side effects.  pain will migrate but primarily over lower back.  used a heating pad yesterday.  Current Medications (verified): 1)  Hydrochlorothiazide 25 Mg Tabs (Hydrochlorothiazide) .... Take One Tablet Daily 2)  Levothyroxine Sodium 75 Mcg Tabs (Levothyroxine Sodium) .Marland Kitchen.. 1 By Mouth Qd 3)  Crestor 10 Mg Tabs (Rosuvastatin Calcium) .Marland Kitchen.. 1 By Mouth Once Daily 4)  Alendronate Sodium 70 Mg Tabs (Alendronate Sodium) .Marland Kitchen.. 1 By Mouth Qwk 5)  Aspir-Low 81 Mg  Tbec (Aspirin) 6)  Vit D 1000 To 1200 U Daily  and  Calcium Every Day 7)  Tylenol Extra Strength 500 Mg Tabs (Acetaminophen) .Marland Kitchen.. 1 or 2 Every 6 Hours As Needed For Pain 8)  Fish Oil 9)  Turmeric 10)  Hydrocodone-Acetaminophen 5-500 Mg Tabs (Hydrocodone-Acetaminophen) .Marland Kitchen.. 1 or 2 By Mouth Every 8 Hours As Needed For Pain, Watch For Drowsines  Allergies (verified): 1)  ! Motrin  Review of Systems      See HPI  Physical Exam  General:  alert and well-developed.   Msk:  no TTP over spine, + paraspinal tenderness bilaterally w/ some spasm.  (-) SLR bilaterally Pulses:  +2 PT Extremities:  no C/C/E   Impression & Recommendations:  Problem # 1:  BACK PAIN (ICD-724.5) Assessment Unchanged  pt's pain consistent w/ muscle pain.  since she is having side effects from  hydrocodone recommended she take plain tylenol and use the muscle relaxer in order to get comfortable.  cautioned her that this may cause drowsiness.  pt to call if no improvement. Her updated medication list for this problem includes:    Aspir-low 81 Mg Tbec (Aspirin)    Tylenol Extra Strength 500 Mg Tabs (Acetaminophen) .Marland Kitchen... 1 or 2 every 6 hours as needed for pain    Hydrocodone-acetaminophen 5-500 Mg Tabs (Hydrocodone-acetaminophen) .Marland Kitchen... 1 or 2 by mouth every 8 hours as needed for pain, watch for drowsines    Cyclobenzaprine Hcl 5 Mg Tabs (Cyclobenzaprine hcl) .Marland Kitchen... 1 tab by mouth three times a day as needed for muscle spasm- may cause drowsiness  Orders: Prescription Created Electronically [phone])  Complete Medication List: 1)  Hydrochlorothiazide 25 Mg Tabs (Hydrochlorothiazide) .... Take one tablet daily 2)  Levothyroxine Sodium 75 Mcg Tabs (Levothyroxine sodium) .Marland Kitchen.. 1 by mouth qd 3)  Crestor 10 Mg Tabs (Rosuvastatin calcium) .Marland Kitchen.. 1 by mouth once daily 4)  Alendronate Sodium 70 Mg Tabs (Alendronate sodium) .Marland Kitchen.. 1 by mouth qwk 5)  Aspir-low 81 Mg Tbec (Aspirin) 6)  Vit D 1000 To 1200 U Daily and Calcium Every Day  7)  Tylenol Extra Strength 500 Mg Tabs (Acetaminophen) .Marland KitchenMarland KitchenMarland Kitchen 1  or 2 every 6 hours as needed for pain 8)  Fish Oil  9)  Turmeric  10)  Hydrocodone-acetaminophen 5-500 Mg Tabs (Hydrocodone-acetaminophen) .Marland Kitchen.. 1 or 2 by mouth every 8 hours as needed for pain, watch for drowsines 11)  Cyclobenzaprine Hcl 5 Mg Tabs (Cyclobenzaprine hcl) .Marland Kitchen.. 1 tab by mouth three times a day as needed for muscle spasm- may cause drowsiness  Patient Instructions: 1)  Take cyclobenzaprine as needed for muscle spasm- this may make you sleepy 2)  Tylenol as needed for pain 3)  Use the heating pad for pain relief but don't use it on bare skin- make sure you have a shirt on 4)  Call if you aren't feeling any better 5)  Hang in there! Prescriptions: CYCLOBENZAPRINE HCL 5 MG TABS (CYCLOBENZAPRINE  HCL) 1 tab by mouth three times a day as needed for muscle spasm- may cause drowsiness  #30 x 0   Entered and Authorized by:   Neena Rhymes MD   Signed by:   Neena Rhymes MD on 08/31/2010   Method used:   Electronically to        [facility] [phone]* (retail)       [address]       [place], [place]  [address]       Ph: [phone]       Fax: [phone]   RxID:   [id number]    Orders Added: 1)  Est. Patient Level III [24401] 2)  Prescription Created Electronically [phone]

## 2010-09-16 NOTE — Assessment & Plan Note (Signed)
Summary: 6 month ROV   Vital Signs:  Patient profile:   77 year old female Weight:      147.50 pounds Pulse rate:   86 / minute Pulse rhythm:   regular BP sitting:   126 / 82  (left arm) Cuff size:   regular  Vitals Entered By: Army Fossa CMA (September 09, 2010 8:10 AM) CC: 6 month f/u- fasting  Comments Not always taking Crestor- causes pain in Knees and Hips [facility] [address]   History of Present Illness: routine office visit   she was seen recently with back pain, doing better No major concerns today, emotionally she is doing well. His son drinks alcohol and lately that problem has been quiet   ROS She is self decreased Crestor to 2 or 3 times a week  due  to knee pain, symptoms better, wonders if she could drop crestor to  3 times a weekonly  Denies chest pain or shortness of breath no lower extremity edema Good medication compliance with all her medicines    Current Medications (verified): 1)  Hydrochlorothiazide 25 Mg Tabs (Hydrochlorothiazide) .... Take One Tablet Daily 2)  Levothyroxine Sodium 75 Mcg Tabs (Levothyroxine Sodium) .Marland Kitchen.. 1 By Mouth Qd 3)  Crestor 10 Mg Tabs (Rosuvastatin Calcium) .Marland Kitchen.. 1 By Mouth Once Daily 4)  Alendronate Sodium 70 Mg Tabs (Alendronate Sodium) .Marland Kitchen.. 1 By Mouth Qwk 5)  Aspir-Low 81 Mg  Tbec (Aspirin) 6)  Vit D 1000 To 1200 U Daily  and  Calcium Every Day 7)  Tylenol Extra Strength 500 Mg Tabs (Acetaminophen) .Marland Kitchen.. 1 or 2 Every 6 Hours As Needed For Pain 8)  Fish Oil 9)  Turmeric  Allergies (verified): 1)  ! Motrin  Past History:  Past Medical History: Reviewed history from 03/09/2010 and no changes required. Hyperlipidemia Hypertension Hypothyroidism Osteoarthritis Hiatal Hernia Left breast-invasive ductal ca stage 1-- stopped arimidex 2008 Diverticulosis, colon Syncope 2005: CT head (-), ECHO essent.  neg, stress test (-), carotid u/s (-) Osteopenia DEXA 7-02 Cscope 2002, TICs next 2012  Past Surgical  History: Reviewed history from 01/30/2007 and no changes required. T&A Vaginal deliveries x 2 Colonoscopy-04/06/2001 DXA 02/2001 ABI 03/26/2002 Lumpectomy- Left breast-04/2002  Social History: Reviewed history from 03/03/2009 and no changes required. 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  good spirits Lungs:  normal respiratory effort, no intercostal retractions, no accessory muscle use, and normal breath sounds.   Heart:  normal rate, regular rhythm, and no murmur.   Extremities:  no lower extremity edema   Impression & Recommendations:  Problem # 1:  BACK PAIN (ICD-724.5)  resolved   Problem # 2:  HYPOTHYROIDISM (ICD-244.9) no change Her updated medication list for this problem includes:    Levothyroxine Sodium 75 Mcg Tabs (Levothyroxine sodium) .Marland Kitchen... 1 by mouth qd  Labs Reviewed: TSH: 1.50 (03/09/2010)    Chol: 170 (03/09/2010)   HDL: 61.60 (03/09/2010)   LDL: 94 (03/09/2010)   TG: 71.0 (03/09/2010)  Problem # 3:  HYPERTENSION (ICD-401.9) at goal  Her updated medication list for this problem includes:    Hydrochlorothiazide 25 Mg Tabs (Hydrochlorothiazide) .Marland Kitchen... Take one tablet daily  BP today: 126/82 Prior BP: 100/68 (08/31/2010)  Labs Reviewed: K+: 4.1 (03/09/2010) Creat: : 0.7 (03/09/2010)   Chol: 170 (03/09/2010)   HDL: 61.60 (03/09/2010)   LDL: 94 (03/09/2010)   TG: 71.0 (  03/09/2010)  Problem # 4:  HYPERLIPIDEMIA (ICD-272.4) daily Crestor was causing knee pain, feels better taking Crestor two or 3 times a week. I agreed to change Crestor "officially" to 3 times a week Her updated medication list for this problem includes:    Crestor 10 Mg Tabs (Rosuvastatin calcium) .Marland Kitchen... Take monday-wednesday-friday only  Labs Reviewed: SGOT: 22 (03/09/2010)   SGPT: 13 (03/09/2010)   HDL:61.60 (03/09/2010), 54.00 (03/03/2009)   LDL:94 (03/09/2010), 103 (45/40/9811)  Chol:170 (03/09/2010), 168 (03/03/2009)  Trig:71.0 (03/09/2010), 56.0 (03/03/2009)  Complete Medication List: 1)  Hydrochlorothiazide 25 Mg Tabs (Hydrochlorothiazide) .... Take one tablet daily 2)  Levothyroxine Sodium 75 Mcg Tabs (Levothyroxine sodium) .Marland Kitchen.. 1 by mouth qd 3)  Crestor 10 Mg Tabs (Rosuvastatin calcium) .... Take monday-wednesday-friday only 4)  Alendronate Sodium 70 Mg Tabs (Alendronate sodium) .Marland Kitchen.. 1 by mouth qwk 5)  Aspir-low 81 Mg Tbec (Aspirin) 6)  Vit D 1000 To 1200 U Daily and Calcium Every Day  7)  Tylenol Extra Strength 500 Mg Tabs (Acetaminophen) .Marland Kitchen.. 1 or 2 every 6 hours as needed for pain 8)  Fish Oil  9)  Turmeric   Patient Instructions: 1)  Please schedule a follow-up appointment in 6 months , fasting , physical   Orders Added: 1)  Est. Patient Level III [91478]

## 2010-10-31 ENCOUNTER — Other Ambulatory Visit: Payer: Self-pay | Admitting: Internal Medicine

## 2010-11-11 ENCOUNTER — Other Ambulatory Visit: Payer: Self-pay | Admitting: Hematology & Oncology

## 2010-11-11 ENCOUNTER — Encounter (HOSPITAL_BASED_OUTPATIENT_CLINIC_OR_DEPARTMENT_OTHER): Payer: Medicare Other | Admitting: Hematology & Oncology

## 2010-11-11 DIAGNOSIS — C50419 Malignant neoplasm of upper-outer quadrant of unspecified female breast: Secondary | ICD-10-CM

## 2010-11-11 DIAGNOSIS — Z853 Personal history of malignant neoplasm of breast: Secondary | ICD-10-CM

## 2010-11-11 DIAGNOSIS — E559 Vitamin D deficiency, unspecified: Secondary | ICD-10-CM

## 2010-11-11 LAB — CBC WITH DIFFERENTIAL (CANCER CENTER ONLY)
BASO#: 0 10*3/uL (ref 0.0–0.2)
EOS%: 1.6 % (ref 0.0–7.0)
HGB: 13.6 g/dL (ref 11.6–15.9)
LYMPH#: 1.3 10*3/uL (ref 0.9–3.3)
MCHC: 33.9 g/dL (ref 32.0–36.0)
MONO#: 0.5 10*3/uL (ref 0.1–0.9)
NEUT#: 3.2 10*3/uL (ref 1.5–6.5)
RBC: 4.55 10*6/uL (ref 3.70–5.32)
WBC: 5.1 10*3/uL (ref 3.9–10.0)

## 2010-11-12 LAB — COMPREHENSIVE METABOLIC PANEL
ALT: 8 U/L (ref 0–35)
AST: 20 U/L (ref 0–37)
Albumin: 4.1 g/dL (ref 3.5–5.2)
BUN: 12 mg/dL (ref 6–23)
CO2: 29 mEq/L (ref 19–32)
Calcium: 9.5 mg/dL (ref 8.4–10.5)
Chloride: 99 mEq/L (ref 96–112)
Creatinine, Ser: 0.91 mg/dL (ref 0.40–1.20)
Potassium: 4 mEq/L (ref 3.5–5.3)

## 2010-11-12 LAB — VITAMIN D 25 HYDROXY (VIT D DEFICIENCY, FRACTURES): Vit D, 25-Hydroxy: 40 ng/mL (ref 30–89)

## 2010-12-25 NOTE — Procedures (Signed)
[facility]  Patient:    Ariel Medina, Ariel Medina                      MRN: [id number] Adm. Date:  [id number] Attending:  Mervin Hack                           Procedure Report  PROCEDURE:  Endoscopic retrograde cholangiopancreatogram.  INDICATIONS:  This 77 year old white female was seen at [facility] Emergency Room on two occasions in the last several months with midabdominal and left upper quadrant abdominal pain radiating to her back.  She was evaluated for kidney stones with a CT scan of the abdomen and ultrasound which showed a small cyst in the left kidney.  Patient has a history of hyperlipidemia.  On physical exam on February 26, 2000, she had tenderness in the left upper quadrant suggesting possible pancreatitis.  Since then, patients symptoms have subsided on a low-fat diet.  Her liver function tests were normal.  She is undergoing ERCP to rule out possibility of pancreatic disease or abnormality of the pancreas.  ENDOSCOPIST:  Hedwig Morton. Juanda Chance, M.D.  ENDOSCOPE:  Olympus single-channel side-viewing duodenoscope.  SEDATION:  Versed 10 mg IV, Demerol 100 mg IV, Glucagon 1 mg IV.  FINDINGS:  Olympus single-channel side-viewing duodenoscope was passed blindly through the esophagus into the stomach.  There was a hiatal hernia in the distal esophagus which was partially reducible.  Gastric folds were normal, as was the gastric antrum.  Pyloric outlet was unremarkable.  Duodenal bulb was normal, distensible, with normal-appearing descending duodenum.  Papilla was located without difficulty and showed normal size.  There was no deformity. Bile was exuding from the papilla.  Main pancreatic duct was cannulated without difficulty and showed normal-appearing main pancreatic duct in the head, body and tail of the pancreas.  Secondary radicles spilled only in the tail.  The tail of the pancreas was normal.  There were no strictures, calcifications and  there was no evidence of pancreatic disease and attempts for filling of the common bile duct were unsuccessful because of repeated filling of the main pancreatic duct.  Patient tolerated the procedure well.  IMPRESSION 1. Normal endoscopic retrograde pancreatography, without cannulation of the    common bile duct. 2. Hiatal hernia.  PLAN:  Since patients symptoms have improved, she is to stay on a low-fat diet and follow up with [place] in the office; if the pain recurs, she will be seen again. DD:  04/05/00 TD:  04/05/00 Job: [id number]

## 2010-12-25 NOTE — H&P (Signed)
NAME:  Ariel Medina                         ACCOUNT NO.:  [id number]   MEDICAL RECORD NO.:  [id number]                   PATIENT TYPE:  INP   LOCATION:  1824                                 FACILITY:  [facility]   PHYSICIAN:  Wanda Plump, MD [facility]                 DATE OF BIRTH:  1927/10/01   DATE OF ADMISSION:  09/23/2003  DATE OF DISCHARGE:                                HISTORY & PHYSICAL   CHIEF COMPLAINT:  Syncope.   HISTORY OF PRESENT ILLNESS:  [name] is a pleasant 77 year old white  female who was doing well until yesterday when she was at church and she had  a sudden feeling of dizziness and she felt hot.  She fainted and loss  consciousness for about one minute.  When she woke up, she noticed that she  had vomited on herself.  She does not remember being postictal and there was  no associated head and chest pain, no shortness of breath with these events.  There was no bladder or bowel incontinence.  Witnesses to the episode told  her that her speech was slow and she is not sure if she was told that it was  slurred.  Her friends helped her go back to her home where she rested, and  she presented herself to the office today to be seen about this issue.  In  general today, she feels better, but she is obviously concerned about this.   PAST MEDICAL HISTORY:  1. Hypothyroidism.  2. Hypertension.  3. Diverticulosis.  4. High cholesterol.  5. History of left breast cancer, status post lumpectomy September 2003,     status post x-ray therapy, currently on Arimidex and she used to be on     Arimidex.  She is also status post radiation therapy.  6. Osteopenia.  7. History of osteopenia.   FAMILY HISTORY:  Diabetes in her brother.  Coronary artery disease in her  brother at age of 60.  History of CVA in her sister at age 48.   SOCIAL HISTORY:  Denies any alcohol or tobacco.   REVIEW OF SYSTEMS:  Basically unrevealing.  She denies any recent fever, no  nausea and vomiting,  diarrhea.  No blood in the stools.  No recent cough.   MEDICATIONS:  1. Actonel one  each week.  2. Levothroid 50 mcg once a day.  3. Arimidex once a day.  4. Hydrochlorothiazide 25 mg once p.o. daily.  5. Potassium 20 mEq one a day.  6. Zocor 40 mg one p.o. daily.   ALLERGIES:  She is intolerant to MOTRIN which causes bleeding but she is  able to tolerate aspirin.   PHYSICAL EXAMINATION:  GENERAL:  The patient is alert and oriented, in no  apparent distress.  VITAL SIGNS:  Respiratory rate 20, blood pressure 118/70, pulse 80. She  weighs 162 pounds.  HEENT:  Ears are normal.  Oropharynx normal.  LUNGS:  Clear to auscultation bilaterally.  CARDIOVASCULAR:  Regular rhythm without a murmur.  Carotid arteries are  normal bilaterally.  There is a questionable decreased pulse of the left  carotid.  ABDOMEN:  Not distended, soft with good bowel sounds.  EXTREMITIES:  There is no peripheral edema.  NEUROLOGIC:  Gait is at baseline.  External ocular movements are intact.  Pupils are equal and reactive.  Motor is basically symmetric and normal  except for a left ptosis.  The patient recalls that she was told she had it  before, so I doubt it is a new finding.  Her mood and judgment is not  impaired.  She is oriented to time, place and person.   LABORATORY AND X-RAY DATA:  Hemoglobin in the office is 15.9, blood sugar  113, pulse oximetry 94%.   ASSESSMENT/PLAN:  1. [name] has been admitted to the hospital after a syncopal episode,     the etiology of which is not clear to me.  Will admit this nice lady to     telemetry as the differential diagnosis includes arrhythmias.  Will also     check a head CT scan, echo and carotid ultrasound as a way to do a stroke     work up.  If the work up is negative and the patient remains stable, she     should be able to go home soon and pursue further work up as an     outpatient.  Will also check cardiac enzymes.  2. Hypothyroidism.  This is  well controlled.  Her last TSH was on December     2004.  3. Hypertension.  This is well controlled.  4. History of breast cancer.  5. History of osteopenia.                                                Wanda Plump, MD [facility]    JEP/MEDQ  D:  09/23/2003  T:  09/23/2003  Job:  [id number]

## 2010-12-25 NOTE — Op Note (Signed)
NAME:  Ariel Medina, Ariel Medina                         ACCOUNT NO.:  [id number]   MEDICAL RECORD NO.:  [id number]                   PATIENT TYPE:  AMB   LOCATION:  DSC                                  FACILITY:  [facility]   PHYSICIAN:  Currie Paris, M.D.           DATE OF BIRTH:  [birth year]   DATE OF PROCEDURE:  04/11/2002  DATE OF DISCHARGE:                                 OPERATIVE REPORT   OFFICE MEDICAL RECORD NUMBER:  [facility] [phone]   PREOPERATIVE DIAGNOSIS:  Carcinoma of the left breast lower outer quadrant.   POSTOPERATIVE DIAGNOSIS:  Carcinoma of the left breast lower outer quadrant.   OPERATION:  Needle localized excision left breast cancer with blue dye  injection and Sentil lymph node dissection.   SURGEON:  Currie Paris, M.D.   ANESTHESIA:  General endotracheal   CLINICAL HISTORY:  This patient is a 77 year old with a recently diagnosed  by core biopsy a small invasive carcinoma of the left breast at about the  3:00 position just barely into the lower outer quadrant from the upper outer  quadrant.   DESCRIPTION OF PROCEDURE:  The patient was seen in the holding area, had a  guide wire successfully placed which entered fairly low down in the left  breast but there was an X marking the spot exactly where the tumor was.  The  patient had no further questions.   She was taken to the operating room and after satisfactory general  endotracheal anesthesia had been obtained, blue dye was injected using 4 cc  of Lymphazurin Blue subareolarly.  The breast was prepped and draped.   I initially approached the lumpectomy and I did an elliptical incision over  the area of the tumor.  Carried the dissection down so that I thought I was  above the guide wire down to fascia and saw the tip of the guide wire, so I  had gotten just above the tip of the guide wire.  I worked then medially and  laterally down to the same depth and then came under the whole area until I  thought I was  inferior to where the tumor was.  Then an inferior flap was  raised and then deepened until I got down to the fascia where I had already  elevated the main tumor up off of the fascia.  The guide wire was then  manipulated into the wound.  I could palpate a mass that seemed to be well  contained within the specimen but I nevertheless sent the specimen off for  mammography.   Bleeders were controlled with cautery.  Packing was placed and attention  turned to the axilla.  A hot area was noted and an incision made directly  over that which was in the inferior edge of the hair line.  A little bit of  brief dissection showed a blue lymph node which was hot and was excised.  It  was the second hot area, a little more posteriorly and superiorly and a  little further dissection revealed a little bit of blue discoloration and  further dissection showed a node which was grasped and excised with cautery.  Both of these had counts of around 550 to 600.  Once they were out, there  were no counts above about 20 in the axilla.  The axilla appeared to be dry  and a pack was placed awaiting for pathology reports.   I initially closed the breast incision and before closing injected a little  Marcaine.  I then put some clips in to mark the margin of excision.  The  deeper breast tissue layer was closed with some 3-0 Vicryl and then  subcutaneous with 3-0 Vicryl and then 4-0 Monocryl subcuticular for the  skin.  The axilla appeared to be dry and it was closed in a similar fashion.  Radiology reported that the specimen contained the tumor and pathology  reported that the nodes were negative, although a few groups of atypical  cells were seen.  Nothing that could be clearly called malignant.   At this point, I elected to terminate the procedure.  Feeling that without a  clear cut diagnosis of carcinoma, it was not appropriate to proceed at this  point to a full nodal dissection.  Sterile dressings were  applied.  The  patient tolerated the procedure well.  There were no operative  complications.  All counts were correct.                                               Currie Paris, M.D.    CJS/MEDQ  D:  04/11/2002  T:  04/12/2002  Job:  [id number]

## 2011-01-22 ENCOUNTER — Ambulatory Visit (INDEPENDENT_AMBULATORY_CARE_PROVIDER_SITE_OTHER): Payer: Medicare Other | Admitting: Internal Medicine

## 2011-01-22 ENCOUNTER — Encounter: Payer: Self-pay | Admitting: Internal Medicine

## 2011-01-22 DIAGNOSIS — E785 Hyperlipidemia, unspecified: Secondary | ICD-10-CM

## 2011-01-22 DIAGNOSIS — I10 Essential (primary) hypertension: Secondary | ICD-10-CM

## 2011-01-22 NOTE — Patient Instructions (Signed)
Check the  blood pressure 2 or 3 times a week, be sure it is less than 140/85. If it is consistently higher, let me know Take tylenol for pain

## 2011-01-22 NOTE — Assessment & Plan Note (Signed)
Held crestor x a while but plans to restart

## 2011-01-22 NOTE — Assessment & Plan Note (Signed)
BP ok, See instructions

## 2011-01-22 NOTE — Progress Notes (Signed)
  Subjective:    Patient ID: Ariel Medina, female    DOB: [birth year], 77 y.o.   MRN: [id number]  HPI  Her neighbor check her BP yesterday and was elevated, reading ? Admits she is under stress   Also self d/c crestor (3/week) but plans to restart  Past Medical History  Diagnosis Date  . Hyperlipidemia   . Hypertension   . Hypothyroidism   . Osteoarthritis   . Hiatal hernia   . Breast ca     left breast-invasive ductal ca stage 1-stopped arimidex 2008  . Diverticulitis of colon   . Syncope 2005    CT head (-), ECHO essent. neg, stress test (-), carotid u/s (-)  . Osteopenia     DEXA 7/02   Past Surgical History  Procedure Date  . Tonsillectomy and adenoidectomy   . Abi 03/26/2002  . 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   Cardiovascular: Negative for chest pain and leg swelling.  Neurological: Negative for headaches.       Objective:   Physical Exam  Constitutional: She appears well-developed and well-nourished.  Cardiovascular: Normal rate, regular rhythm and normal heart sounds.   No murmur heard. Pulmonary/Chest: Effort normal and breath sounds normal. No respiratory distress. She has no wheezes. She has no rales.  Musculoskeletal: She exhibits no edema.  Psychiatric: She has a normal mood and affect. Her behavior is normal. Judgment and thought content normal.          Assessment & Plan:

## 2011-03-06 ENCOUNTER — Other Ambulatory Visit: Payer: Self-pay | Admitting: Internal Medicine

## 2011-03-10 ENCOUNTER — Encounter: Payer: Medicare Other | Admitting: Internal Medicine

## 2011-04-14 ENCOUNTER — Ambulatory Visit (INDEPENDENT_AMBULATORY_CARE_PROVIDER_SITE_OTHER): Payer: Medicare Other | Admitting: Internal Medicine

## 2011-04-14 ENCOUNTER — Encounter: Payer: Self-pay | Admitting: Internal Medicine

## 2011-04-14 DIAGNOSIS — Z0000 Encounter for general adult medical examination without abnormal findings: Secondary | ICD-10-CM

## 2011-04-14 DIAGNOSIS — M1990 Unspecified osteoarthritis, unspecified site: Secondary | ICD-10-CM

## 2011-04-14 DIAGNOSIS — E039 Hypothyroidism, unspecified: Secondary | ICD-10-CM

## 2011-04-14 DIAGNOSIS — M949 Disorder of cartilage, unspecified: Secondary | ICD-10-CM

## 2011-04-14 DIAGNOSIS — E785 Hyperlipidemia, unspecified: Secondary | ICD-10-CM

## 2011-04-14 LAB — LIPID PANEL
LDL Cholesterol: 100 mg/dL — ABNORMAL HIGH (ref 0–99)
Total CHOL/HDL Ratio: 3

## 2011-04-14 LAB — TSH: TSH: 1.28 u[IU]/mL (ref 0.35–5.50)

## 2011-04-14 MED ORDER — ZOSTER VACCINE LIVE 19400 UNT/0.65ML ~~LOC~~ SOLR: 0.6500 mL | Freq: Once | SUBCUTANEOUS | Status: DC

## 2011-04-14 NOTE — Assessment & Plan Note (Addendum)
Pain mostly at hip (R) Plan: XR Tylenol Offered PT , she seems to be loosing ground:  States will go as long as it close to her house

## 2011-04-14 NOTE — Progress Notes (Signed)
Subjective:    Patient ID: Ariel Medina, female    DOB: Apr 26, 1928, 77 y.o.   MRN: [id number]  HPI   Here for Medicare AWV: 1. Risk factors based on Past M, S, F history: yes  2. Physical Activities: active , independent on ADL, still drives  3. Depression/mood: denies depressive moods 4. Hearing: still has  R hearing decrease, has not seen the specialist in a while, does not feel that needs evaluation 5. ADL's: independent on all her ADLs 6. Fall Risk: no recent falls, prevention discussed   7. Home Safety: feels safe at home , has  fire detectors  8. Height, weight, &visual acuity:see VS, no problems with vision 9. Counseling: provided , see below 10. Labs ordered based on risk factors: yes  11.       Referral Coordination: if needed  12.        Care Plan--- see A/P  13.        Cognitive Assessment :  memory, alertness wnl; motor skill seems appropriate for age although she is slower than last year  In addition, we discussed the following R hip pain x a while , uses tylenol prn, not limiting  ADLs  High cholesterol-- on crestor 3 times a week, tolerates well Osteopenia-- on fosamax, good compliance, no apparent s/e Thyroid-- good med compliance HTN-- no amb BPs   Past Medical History  Diagnosis Date  . Hyperlipidemia   . Hypertension   . Hypothyroidism   . Osteoarthritis   . Hiatal hernia   . Breast ca     left breast-invasive ductal ca stage 1-stopped arimidex 2008  . Diverticulitis of colon   . Syncope 2005    CT head (-), ECHO essent. neg, stress test (-), carotid u/s (-)  . Osteopenia     DEXA 7/02   Past Surgical History  Procedure Date  . Tonsillectomy and adenoidectomy   . Abi 03/26/2002  . Breast lumpectomy 04/2002    left    Family History: mother pass away  age 35  Diabetes -- M . dx late in life  MI-- B   at age 59 CVA-- F , age 28 . sister 76 colon ca--no Breast ca--no  Social History: son lives w/ her Mikael Spray , one of my  patients) Widow since 1993 still drives ADL independent Never Smoked Alcohol use-no    Review of Systems  Constitutional: Negative for fever and fatigue.  Respiratory: Negative for cough and shortness of breath.   Cardiovascular: Negative for chest pain and leg swelling.  Gastrointestinal: Negative for blood in stool and abdominal distention.  Genitourinary: Negative for hematuria, vaginal bleeding and difficulty urinating.       Objective:   Physical Exam  Constitutional: She is oriented to person, place, and time. She appears well-developed and well-nourished. No distress.  HENT:  Head: Normocephalic and atraumatic.  Neck: No thyromegaly present.  Cardiovascular: Normal rate, regular rhythm and normal heart sounds.   No murmur heard. Pulmonary/Chest: Effort normal and breath sounds normal. No respiratory distress. She has no wheezes. She has no rales.  Abdominal: She exhibits no distension. There is no tenderness. There is no rebound and no guarding.  Genitourinary:       Breast exam: No dominant nodules, no axillary nodules. Both breasts are quite tender to touch  Musculoskeletal: She exhibits no edema.       Keep rotation slightly tender mostly on the right side. She walks with more difficulty today than  she did last year. Likely from DJD  Neurological: She is alert and oriented to person, place, and time.  Skin: She is not diaphoretic.  Psychiatric: She has a normal mood and affect. Her behavior is normal.          Assessment & Plan:

## 2011-04-14 NOTE — Assessment & Plan Note (Addendum)
Td 2008 pneumonia shot 02-2008 discussed  shingles shot , Rx provided  rec flu shot this year  Last MMG 06-2010, next 1 year PAP 2001 (-) , no h/o hysterectomy, no h/o abnormal PAPs ----> pt not interested on further PAPs, she is low risk SBE encouraged   Cscope  2002, was rec next 2012 neg  iFOB 7-10, and 2011 iFOB provided , discussed Cscope (declined) Diet and exercise discussed

## 2011-04-14 NOTE — Assessment & Plan Note (Signed)
Doing well w/  crestor x 3 / week labs

## 2011-04-14 NOTE — Patient Instructions (Signed)
Get the XR Tylenol 500 mg 1 or 2 tablets every 6 hours as needed Remain active

## 2011-04-14 NOTE — Assessment & Plan Note (Signed)
Last bone density test 01/2010, osteopenia stable, was recommended to continue with Fosamax

## 2011-04-14 NOTE — Assessment & Plan Note (Signed)
Reports good med compliance, labs

## 2011-04-19 ENCOUNTER — Encounter: Payer: Self-pay | Admitting: Internal Medicine

## 2011-04-29 ENCOUNTER — Other Ambulatory Visit: Payer: Self-pay | Admitting: Internal Medicine

## 2011-05-03 ENCOUNTER — Encounter: Payer: Self-pay | Admitting: *Deleted

## 2011-05-05 ENCOUNTER — Ambulatory Visit (INDEPENDENT_AMBULATORY_CARE_PROVIDER_SITE_OTHER)
Admission: RE | Admit: 2011-05-05 | Discharge: 2011-05-05 | Disposition: A | Discharge status: Home / Self Care | Payer: Medicare Other | Source: Ambulatory Visit | Attending: Internal Medicine | Admitting: Internal Medicine

## 2011-05-05 DIAGNOSIS — M1990 Unspecified osteoarthritis, unspecified site: Secondary | ICD-10-CM

## 2011-05-06 ENCOUNTER — Other Ambulatory Visit: Payer: Medicare Other

## 2011-05-06 ENCOUNTER — Other Ambulatory Visit: Payer: Self-pay | Admitting: Internal Medicine

## 2011-05-06 DIAGNOSIS — Z0000 Encounter for general adult medical examination without abnormal findings: Secondary | ICD-10-CM

## 2011-05-06 LAB — FECAL OCCULT BLOOD, IMMUNOCHEMICAL: Fecal Occult Bld: NEGATIVE

## 2011-05-12 ENCOUNTER — Telehealth: Payer: Self-pay | Admitting: *Deleted

## 2011-05-12 NOTE — Telephone Encounter (Signed)
Lab results mailed.

## 2011-06-10 ENCOUNTER — Telehealth: Payer: Self-pay | Admitting: Internal Medicine

## 2011-06-10 DIAGNOSIS — Z1239 Encounter for other screening for malignant neoplasm of breast: Secondary | ICD-10-CM

## 2011-06-10 NOTE — Telephone Encounter (Signed)
Done

## 2011-06-10 NOTE — Telephone Encounter (Signed)
Patient needs order for mammo - patient has appt [id number] - [facility]

## 2011-07-02 ENCOUNTER — Encounter: Payer: Self-pay | Admitting: Internal Medicine

## 2011-07-02 ENCOUNTER — Other Ambulatory Visit: Payer: Self-pay | Admitting: Internal Medicine

## 2011-08-17 DIAGNOSIS — M79609 Pain in unspecified limb: Secondary | ICD-10-CM | POA: Diagnosis not present

## 2011-08-17 DIAGNOSIS — B351 Tinea unguium: Secondary | ICD-10-CM | POA: Diagnosis not present

## 2011-10-09 ENCOUNTER — Other Ambulatory Visit: Payer: Self-pay | Admitting: Internal Medicine

## 2011-10-11 NOTE — Telephone Encounter (Signed)
Refill done.  

## 2011-10-12 ENCOUNTER — Ambulatory Visit (INDEPENDENT_AMBULATORY_CARE_PROVIDER_SITE_OTHER): Payer: Medicare Other | Admitting: Internal Medicine

## 2011-10-12 VITALS — BP 130/82 | HR 85 | Temp 97.6°F | Wt 151.0 lb

## 2011-10-12 DIAGNOSIS — M1990 Unspecified osteoarthritis, unspecified site: Secondary | ICD-10-CM | POA: Diagnosis not present

## 2011-10-12 DIAGNOSIS — E785 Hyperlipidemia, unspecified: Secondary | ICD-10-CM | POA: Diagnosis not present

## 2011-10-12 DIAGNOSIS — I10 Essential (primary) hypertension: Secondary | ICD-10-CM | POA: Diagnosis not present

## 2011-10-12 LAB — CBC WITH DIFFERENTIAL/PLATELET
Basophils Relative: 0.3 % (ref 0.0–3.0)
Eosinophils Absolute: 0.1 10*3/uL (ref 0.0–0.7)
Eosinophils Relative: 1.8 % (ref 0.0–5.0)
Hemoglobin: 13.6 g/dL (ref 12.0–15.0)
MCHC: 33.4 g/dL (ref 30.0–36.0)
MCV: 87.7 fl (ref 78.0–100.0)
Monocytes Absolute: 0.4 10*3/uL (ref 0.1–1.0)
Neutro Abs: 3.8 10*3/uL (ref 1.4–7.7)
RBC: 4.63 Mil/uL (ref 3.87–5.11)
WBC: 5.6 10*3/uL (ref 4.5–10.5)

## 2011-10-12 LAB — BASIC METABOLIC PANEL
CO2: 32 mEq/L (ref 19–32)
Chloride: 99 mEq/L (ref 96–112)
Potassium: 5.1 mEq/L (ref 3.5–5.1)
Sodium: 136 mEq/L (ref 135–145)

## 2011-10-12 NOTE — Assessment & Plan Note (Signed)
Takes crestor 1 tab 3 times a week, tolerates well, no change

## 2011-10-12 NOTE — Progress Notes (Signed)
  Subjective:    Patient ID: Ariel Medina, female    DOB: 02-03-1928, 78 y.o.   MRN: [id number]  HPI ROV Since her last office visit she is doing well. Had a normal mammogram, was referred to physical therapy but she couldn't go due to family issues. Medication list is reviewed, she is doing well with Crestor 3 times a week.    Past Medical History  Diagnosis Date  . Hyperlipidemia   . Hypertension   . Hypothyroidism   . Osteoarthritis   . Hiatal hernia   . Breast ca     left breast-invasive ductal ca stage 1-stopped arimidex 2008  . Diverticulitis of colon   . Syncope 2005    CT head (-), ECHO essent. neg, stress test (-), carotid u/s (-)  . Osteopenia     DEXA 7/02   Past Surgical History  Procedure Date  . Tonsillectomy and adenoidectomy   . Abi 03/26/2002  . Breast lumpectomy 04/2002    left     Review of Systems No chest pain or shortness of breath No nausea, vomiting, diarrhea DJD symptoms stable/slightly better.    Objective:   Physical Exam  Alert oriented x3, no apparent distress Lungs are clear to auscultation bilaterally Cardiovascular regular rate and rhythm without a murmur Psych, she seems to be in good spirits, behavior is normal.      Assessment & Plan:

## 2011-10-12 NOTE — Assessment & Plan Note (Signed)
Well controlled, labs  

## 2011-10-12 NOTE — Assessment & Plan Note (Signed)
Did not go for PT, overall doing ok w/tylenol prn and reduction of crestor dose

## 2011-10-12 NOTE — Assessment & Plan Note (Signed)
Did get the shingles shot ~ 9-12

## 2011-10-13 ENCOUNTER — Encounter: Payer: Self-pay | Admitting: Internal Medicine

## 2011-10-14 ENCOUNTER — Encounter: Payer: Self-pay | Admitting: *Deleted

## 2011-10-26 ENCOUNTER — Other Ambulatory Visit: Payer: Self-pay | Admitting: Internal Medicine

## 2011-10-26 NOTE — Telephone Encounter (Signed)
Refill done.  

## 2011-11-11 ENCOUNTER — Ambulatory Visit (HOSPITAL_BASED_OUTPATIENT_CLINIC_OR_DEPARTMENT_OTHER): Payer: Medicare Other | Admitting: Hematology & Oncology

## 2011-11-11 ENCOUNTER — Other Ambulatory Visit (HOSPITAL_BASED_OUTPATIENT_CLINIC_OR_DEPARTMENT_OTHER): Payer: Medicare Other | Admitting: Lab

## 2011-11-11 VITALS — BP 136/96 | HR 71 | Temp 97.6°F | Ht 62.0 in | Wt 150.0 lb

## 2011-11-11 DIAGNOSIS — E559 Vitamin D deficiency, unspecified: Secondary | ICD-10-CM | POA: Diagnosis not present

## 2011-11-11 DIAGNOSIS — Z853 Personal history of malignant neoplasm of breast: Secondary | ICD-10-CM | POA: Diagnosis not present

## 2011-11-11 DIAGNOSIS — C50419 Malignant neoplasm of upper-outer quadrant of unspecified female breast: Secondary | ICD-10-CM | POA: Diagnosis not present

## 2011-11-11 DIAGNOSIS — C50919 Malignant neoplasm of unspecified site of unspecified female breast: Secondary | ICD-10-CM

## 2011-11-11 LAB — CBC WITH DIFFERENTIAL (CANCER CENTER ONLY)
BASO#: 0 10*3/uL (ref 0.0–0.2)
EOS%: 1.8 % (ref 0.0–7.0)
Eosinophils Absolute: 0.1 10*3/uL (ref 0.0–0.5)
HGB: 13 g/dL (ref 11.6–15.9)
LYMPH%: 23.4 % (ref 14.0–48.0)
MCH: 28.7 pg (ref 26.0–34.0)
MCHC: 33.1 g/dL (ref 32.0–36.0)
MCV: 87 fL (ref 81–101)
MONO%: 10.2 % (ref 0.0–13.0)
NEUT#: 3.3 10*3/uL (ref 1.5–6.5)
Platelets: 280 10*3/uL (ref 145–400)

## 2011-11-11 NOTE — Progress Notes (Signed)
This office note has been dictated.

## 2011-11-12 LAB — COMPREHENSIVE METABOLIC PANEL
AST: 17 U/L (ref 0–37)
Albumin: 4.1 g/dL (ref 3.5–5.2)
Alkaline Phosphatase: 51 U/L (ref 39–117)
BUN: 13 mg/dL (ref 6–23)
Glucose, Bld: 80 mg/dL (ref 70–99)
Potassium: 3.8 mEq/L (ref 3.5–5.3)
Total Bilirubin: 0.5 mg/dL (ref 0.3–1.2)

## 2011-11-12 NOTE — Progress Notes (Signed)
CC:   Ariel Ora, MD  DIAGNOSIS:  Stage I (T1 N0 M0 0) ductal carcinoma left breast.  CURRENT THERAPY:  Observation.  INTERIM HISTORY:  Ariel Medina comes in for followup.  She comes back yearly. It has now been 10 years since she had her breast cancer.  She is doing well.  She has had no complaints at all.  As always, we had excellent fellowship and praised the [place].  There is no problems with fevers, sweats or chills.  She has had no change in her medications.  She sees Dr. Drue Novel and she really likes him.  PHYSICAL EXAMINATION:  This is an elderly white female in no obvious distress.  Vital signs:  Temperature of 97.6, pulse 71, respiratory rate 18, blood pressure 136/96.  Weight is 150.  Head and neck exam shows a normocephalic, atraumatic skull.  There are no ocular or oral lesions. There are no palpable cervical or supraclavicular lymph nodes.  Lungs: Clear to percussion and auscultation bilaterally.  Cardiac:  Regular rate and rhythm with a normal S1 and S2.  There are no murmurs, rubs or bruits.  Abdomen:  Soft with good bowel sounds.  There is no palpable abdominal mass.  There is no fluid wave.  There is no palpable hepatosplenomegaly.  Breasts:  Exam shows right breast with no masses, edema or erythema.  There is no right axillary adenopathy.  Left breast shows well-healed lumpectomy at the 4 o'clock position.  There is some slight contraction of the left breast.  There is no distinct mass in the left breast.  There is no left axillary adenopathy.  Back:  Exam shows some slight kyphosis.  There is no tenderness over the spine, ribs, or hips.  Extremities:  No clubbing, cyanosis or edema.  Neurologic:  No focal neurological deficits.  LABORATORY STUDIES:  Her last mammogram was done back in November 2012. Everything looked fine with no suspicious findings.  Her CBC shows a white cell count 5.1, hemoglobin 13, hematocrit 39, platelet count 280.  IMPRESSION:  Ariel Medina is an  78 year old white female with history of stage I ductal carcinoma of the left breast.  She is 10 years out now. I think that we probably let her go from the practice now.  I think her risk of recurrence is going to be less than 5%.  Again, we had excellent fellowship with Ariel Medina. We always have excellent fellowship with Ariel Medina.  Will be glad to see her back for any other reason.    ______________________________ Josph Macho, M.D. PRE/MEDQ  D:  11/11/2011  T:  11/12/2011  Job:  [id number]

## 2011-11-15 ENCOUNTER — Telehealth: Payer: Self-pay | Admitting: *Deleted

## 2011-11-15 NOTE — Telephone Encounter (Addendum)
Message copied by Mirian Capuchin on Mon Nov 15, 2011 10:37 AM ------      Message from: Josph Macho      Created: Sun Nov 14, 2011  2:01 PM       Call- [facility] are ok. Pete Pt given message and voiced understanding.

## 2011-11-16 DIAGNOSIS — B351 Tinea unguium: Secondary | ICD-10-CM | POA: Diagnosis not present

## 2011-11-16 DIAGNOSIS — M79609 Pain in unspecified limb: Secondary | ICD-10-CM | POA: Diagnosis not present

## 2011-12-17 ENCOUNTER — Telehealth: Payer: Self-pay | Admitting: Internal Medicine

## 2011-12-17 MED ORDER — ALENDRONATE SODIUM 70 MG PO TABS: 70.0000 mg | ORAL_TABLET | ORAL | Status: DC

## 2011-12-17 NOTE — Telephone Encounter (Signed)
Refill: Alendronate sodium 70mg  tab. 90 day supply

## 2011-12-17 NOTE — Telephone Encounter (Signed)
Refill done.  

## 2012-02-09 ENCOUNTER — Ambulatory Visit (INDEPENDENT_AMBULATORY_CARE_PROVIDER_SITE_OTHER): Payer: Medicare Other | Admitting: Internal Medicine

## 2012-02-09 VITALS — BP 134/86 | HR 70 | Temp 98.1°F | Wt 148.0 lb

## 2012-02-09 DIAGNOSIS — M549 Dorsalgia, unspecified: Secondary | ICD-10-CM | POA: Diagnosis not present

## 2012-02-09 LAB — POCT URINALYSIS DIPSTICK
Bilirubin, UA: NEGATIVE
Glucose, UA: NEGATIVE
Ketones, UA: NEGATIVE
Spec Grav, UA: 1.01

## 2012-02-09 MED ORDER — CYCLOBENZAPRINE HCL 5 MG PO TABS: 5.0000 mg | ORAL_TABLET | Freq: Three times a day (TID) | ORAL | Status: DC | PRN

## 2012-02-09 NOTE — Patient Instructions (Addendum)
Rest, take Tylenol and Flexeril, the muscle relaxant. Be aware that Flexeril will make you sleepy. Use a heating pad. Call anytime if the symptoms are severe or you have a rash in the back. Call if not improving in few days

## 2012-02-09 NOTE — Progress Notes (Signed)
  Subjective:    Patient ID: Ariel Medina, female    DOB: [birth year], 78 y.o.   MRN: [id number]  HPI Acute visit 3 days history of pain at the right thoracic back, pain is almost daily, sharp "like a knife" , occasionally the pain gets more intense and lasts a few seconds, it change depending on her posture and torso movement. Reports that she has these episodes for years and usually responds well to Flexeril which she takes rarely. Pain does not change with po intake.   Past Medical History  Diagnosis Date  . Hyperlipidemia   . Hypertension   . Hypothyroidism   . Osteoarthritis   . Hiatal hernia   . Breast CA     left breast-invasive ductal ca stage 1-stopped arimidex 2008  . Diverticulitis of colon   . Syncope 2005    CT head (-), ECHO essent. neg, stress test (-), carotid u/s (-)  . Osteopenia     DEXA 7/02   Past Surgical History  Procedure Date  . Tonsillectomy and adenoidectomy   . Abi 03/26/2002  . Breast lumpectomy 04/2002    left    Review of Systems No fever chills No chest pain or shortness of breath No rash No abdominal pain, dysuria, gross hematuria.    Objective:   Physical Exam  Musculoskeletal:       Arms:    General -- alert, well-developed. No apparent distress.  Lungs -- normal respiratory effort, no intercostal retractions, no accessory muscle use, and normal breath sounds.   Heart-- normal rate, regular rhythm, no murmur, and no gallop.   Abdomen--soft, non-tender, no distention, no masses, no HSM, no guarding, and no rigidity.   Extremities-- no pretibial edema bilaterally  Neurologic-- alert & oriented X3, strength normal in all extremities; posture is not antalgic. Psych-- Cognition and judgment appear intact. Alert and cooperative with normal attention span and concentration.  not anxious appearing and not depressed appearing.        Assessment & Plan:

## 2012-02-09 NOTE — Assessment & Plan Note (Signed)
Pain consistent with a muscle skeletal issue. Review of systems essentially negative for other etiologies. She takes Flexeril rarely and tolerates well Plan: Check a UA -- trace blood, will culture it and prescribe antibiotics if appropriate Flexeril, Rest, Heat

## 2012-02-11 ENCOUNTER — Encounter: Payer: Self-pay | Admitting: Internal Medicine

## 2012-02-14 ENCOUNTER — Other Ambulatory Visit: Payer: Self-pay | Admitting: Family Medicine

## 2012-02-14 LAB — URINE CULTURE: Colony Count: >=100000

## 2012-02-14 MED ORDER — CIPROFLOXACIN HCL 250 MG PO TABS: 250.0000 mg | ORAL_TABLET | Freq: Two times a day (BID) | ORAL | Status: DC

## 2012-02-14 NOTE — Addendum Note (Signed)
Addended by: Edwena Felty T on: 02/14/2012 05:37 PM   Modules accepted: Orders

## 2012-02-14 NOTE — Telephone Encounter (Signed)
Spoke with pharmacy & the fax request was sent in error.

## 2012-02-14 NOTE — Telephone Encounter (Signed)
Please advise Paz pt

## 2012-02-28 ENCOUNTER — Other Ambulatory Visit (INDEPENDENT_AMBULATORY_CARE_PROVIDER_SITE_OTHER): Payer: Medicare Other

## 2012-02-28 DIAGNOSIS — N390 Urinary tract infection, site not specified: Secondary | ICD-10-CM

## 2012-02-28 LAB — POCT URINALYSIS DIPSTICK
Protein, UA: NEGATIVE
Urobilinogen, UA: 0.2
pH, UA: 7

## 2012-03-01 LAB — URINE CULTURE: Colony Count: 50000

## 2012-03-02 DIAGNOSIS — L84 Corns and callosities: Secondary | ICD-10-CM | POA: Diagnosis not present

## 2012-03-02 DIAGNOSIS — M79609 Pain in unspecified limb: Secondary | ICD-10-CM | POA: Diagnosis not present

## 2012-03-02 DIAGNOSIS — B351 Tinea unguium: Secondary | ICD-10-CM | POA: Diagnosis not present

## 2012-03-04 ENCOUNTER — Other Ambulatory Visit: Payer: Self-pay | Admitting: Internal Medicine

## 2012-03-06 NOTE — Telephone Encounter (Signed)
Refill done.  

## 2012-03-06 NOTE — Telephone Encounter (Signed)
Ok #10, no RF 

## 2012-03-06 NOTE — Telephone Encounter (Signed)
Spoke with pt & she states that she feels better but she still has the urgency to go & sometimes does not make it to the bathroom on time. Ok to refill?

## 2012-03-31 DIAGNOSIS — J0100 Acute maxillary sinusitis, unspecified: Secondary | ICD-10-CM | POA: Diagnosis not present

## 2012-04-13 ENCOUNTER — Ambulatory Visit (INDEPENDENT_AMBULATORY_CARE_PROVIDER_SITE_OTHER): Payer: Medicare Other | Admitting: Internal Medicine

## 2012-04-13 VITALS — BP 124/84 | HR 73 | Temp 97.7°F | Ht 61.25 in | Wt 146.0 lb

## 2012-04-13 DIAGNOSIS — M949 Disorder of cartilage, unspecified: Secondary | ICD-10-CM | POA: Diagnosis not present

## 2012-04-13 DIAGNOSIS — M899 Disorder of bone, unspecified: Secondary | ICD-10-CM

## 2012-04-13 DIAGNOSIS — E785 Hyperlipidemia, unspecified: Secondary | ICD-10-CM

## 2012-04-13 DIAGNOSIS — E039 Hypothyroidism, unspecified: Secondary | ICD-10-CM | POA: Diagnosis not present

## 2012-04-13 DIAGNOSIS — N390 Urinary tract infection, site not specified: Secondary | ICD-10-CM | POA: Diagnosis not present

## 2012-04-13 DIAGNOSIS — I10 Essential (primary) hypertension: Secondary | ICD-10-CM

## 2012-04-13 DIAGNOSIS — Z853 Personal history of malignant neoplasm of breast: Secondary | ICD-10-CM

## 2012-04-13 DIAGNOSIS — Z0000 Encounter for general adult medical examination without abnormal findings: Secondary | ICD-10-CM

## 2012-04-13 LAB — ALT: ALT: 10 U/L (ref 0–35)

## 2012-04-13 LAB — BASIC METABOLIC PANEL
BUN: 14 mg/dL (ref 6–23)
Chloride: 102 mEq/L (ref 96–112)
Creatinine, Ser: 0.9 mg/dL (ref 0.4–1.2)
Glucose, Bld: 72 mg/dL (ref 70–99)
Potassium: 3.6 mEq/L (ref 3.5–5.1)

## 2012-04-13 LAB — POCT URINALYSIS DIPSTICK
Leukocytes, UA: NEGATIVE
Nitrite, UA: NEGATIVE
Protein, UA: NEGATIVE
Urobilinogen, UA: 0.2
pH, UA: 7.5

## 2012-04-13 LAB — TSH: TSH: 1.86 u[IU]/mL (ref 0.35–5.50)

## 2012-04-13 LAB — LIPID PANEL
Cholesterol: 194 mg/dL (ref 0–200)
LDL Cholesterol: 123 mg/dL — ABNORMAL HIGH (ref 0–99)
Total CHOL/HDL Ratio: 3
VLDL: 12.2 mg/dL (ref 0.0–40.0)

## 2012-04-13 NOTE — Assessment & Plan Note (Signed)
Good compliance with calcium, vitamin D, Fosamax which she takes correctly. Plan: Continue meds, check a bone density test

## 2012-04-13 NOTE — Assessment & Plan Note (Signed)
On HCTZ daily, check a BMP. No change, BP today normal

## 2012-04-13 NOTE — Assessment & Plan Note (Addendum)
Td 2008 , pneumonia shot 02-2008 , shingles shot --2012, rec flu shot this year  Last MMG 06-2011 normal Breast exam abnormal see "breast ca" PAP 2001 (-) , no h/o hysterectomy, no h/o abnormal PAPs ----> pt not interested on further PAPs, she is low risk  Cscope 2002, was rec next 2012  neg iFOB 7-10, 2011 and 05-2011 We discussed further colon ca screening, pt not interested thus--->  No further screening planned  Diet and exercise discussed

## 2012-04-13 NOTE — Assessment & Plan Note (Addendum)
Update on her mammograms, breast exam showing that the left nipple is different, patient states that "it has been that way since the surgery". I don't recall the left nipple being different, plan: Ultrasound

## 2012-04-13 NOTE — Assessment & Plan Note (Signed)
Good compliance w/ crestor , due for labs

## 2012-04-13 NOTE — Assessment & Plan Note (Signed)
Due for a TSH, cont meds

## 2012-04-13 NOTE — Progress Notes (Signed)
  Subjective:    Patient ID: Ariel Medina, female    DOB: 1928-06-19, 78 y.o.   MRN: [id number]  HPI Here for Medicare AWV: 1. Risk factors based on Past M, S, F history: yes   2. Physical Activities: active , independent on ADL, still drives   3. Depression/mood: denies depressive moods 4. Hearing: last year reported a R hearing decrease, today reports is better after a ENT rx abx  5. ADL's: independent on all her ADLs 6. Fall Risk: no recent falls, prevention discussed  again, does avoid situations that may trigger falls  7. Home Safety: feels safe at home    8. Height, weight, &visual acuity:see VS, no problems with vision 9. Counseling: provided , see below 10. Labs ordered based on risk factors: yes   11. Referral Coordination: if needed   12. Care Plan--- see A/P   13. Cognitive Assessment :  memory, alertness wnl; motor skill seems appropriate for age and seems stable compared to last year  In addition, we discussed the following Cholesterol, good compliance with Crestor, 1 tablet 3 times a week Osteopenia, good compliance with calcium, vitamin D. Takes Fosamax correctly. Hypothyroidism, good medication compliance. Status post a UTI, now asymptomatic, would like to be sure the infection is gone, will check a udip   Past Medical History: Hyperlipidemia Hypertension Hypothyroidism Osteoarthritis Hiatal Hernia Left breast-invasive ductal ca stage 1-- stopped arimidex 2008 Diverticulosis, colon Syncope 2005: CT head (-), ECHO essent.  neg, stress test (-), carotid u/s (-) Osteopenia DEXA 7-02  Past Surgical History: T&A Vaginal deliveries x 2 Lumpectomy- Left breast-04/2002  Family History: mother pass away  age 40   Diabetes -- M . dx late in life   MI-- B   at age 77 CVA-- F , age 13 . sister 6 colon ca--no Breast ca--no  Social History: son lives w/ her on-off Mikael Spray , one of my patients) Widow since 1993 still drives, ADL: independent Never  Smoked Alcohol use-no   Review of Systems No chest pain or shortness or breath Note nausea, vomiting, diarrhea or blood in the stools. No dysuria or gross hematuria.     Objective:   Physical Exam General -- alert, well-developed  Neck --no thyromegaly , normal carotid pulse Breasts-- both are quite sensitive to touch, no dominant mass. The left nipple seems different, slightly swollen or indurated?. It is not red, warm, there is no nipple discharge. No axillary lymphadenopathy. Lungs -- decreased breath sounds but clear Heart-- normal rate, regular rhythm, no murmur, and no gallop.   Abdomen--soft, non-tender, no distention, no masses, no HSM, no guarding, and no rigidity.   Extremities-- trace  pretibial edema bilaterally Neurologic-- alert & oriented X3 and strength normal in all extremities. Psych-- Cognition and judgment appear intact. Alert and cooperative with normal attention span and concentration.  not anxious appearing and not depressed appearing.      Assessment & Plan:  History of a UTI, udip (-)

## 2012-04-14 ENCOUNTER — Encounter: Payer: Self-pay | Admitting: Internal Medicine

## 2012-04-18 ENCOUNTER — Encounter: Payer: Self-pay | Admitting: *Deleted

## 2012-04-18 DIAGNOSIS — N63 Unspecified lump in unspecified breast: Secondary | ICD-10-CM | POA: Diagnosis not present

## 2012-04-25 ENCOUNTER — Encounter: Payer: Self-pay | Admitting: Internal Medicine

## 2012-04-27 ENCOUNTER — Encounter: Payer: Self-pay | Admitting: Internal Medicine

## 2012-05-10 ENCOUNTER — Telehealth: Payer: Self-pay

## 2012-05-10 NOTE — Telephone Encounter (Signed)
Pt states can't keep bone density test appt need to reschedule. Plz advise pt.       MW

## 2012-05-11 ENCOUNTER — Telehealth: Payer: Self-pay | Admitting: Internal Medicine

## 2012-05-22 ENCOUNTER — Other Ambulatory Visit: Payer: Medicare Other

## 2012-05-24 ENCOUNTER — Ambulatory Visit (INDEPENDENT_AMBULATORY_CARE_PROVIDER_SITE_OTHER)
Admission: RE | Admit: 2012-05-24 | Discharge: 2012-05-24 | Disposition: A | Discharge status: Home / Self Care | Payer: Medicare Other | Source: Ambulatory Visit | Attending: Internal Medicine | Admitting: Internal Medicine

## 2012-05-24 DIAGNOSIS — M899 Disorder of bone, unspecified: Secondary | ICD-10-CM

## 2012-05-24 DIAGNOSIS — M949 Disorder of cartilage, unspecified: Secondary | ICD-10-CM

## 2012-05-30 ENCOUNTER — Other Ambulatory Visit: Payer: Medicare Other

## 2012-06-06 DIAGNOSIS — M79609 Pain in unspecified limb: Secondary | ICD-10-CM | POA: Diagnosis not present

## 2012-06-06 DIAGNOSIS — B351 Tinea unguium: Secondary | ICD-10-CM | POA: Diagnosis not present

## 2012-06-09 DIAGNOSIS — Z23 Encounter for immunization: Secondary | ICD-10-CM | POA: Diagnosis not present

## 2012-08-04 ENCOUNTER — Telehealth: Payer: Self-pay | Admitting: Internal Medicine

## 2012-08-04 NOTE — Telephone Encounter (Signed)
bone density test showed again osteopenia, T score is -2.1, slightly worse than 2011 when the T score was -1.8. She has been taking biphosphonates for at least 4 years. Advise patient: Bone density test showed again  osteopenia, recommend to continue to take calcium, vitamin D daily and Fosamax weekly. Will consider d/c Fosamax at some point in the next year since she has been taking biphosphonates for at least 4 years. We'll discuss further on return to the office

## 2012-08-07 NOTE — Telephone Encounter (Signed)
Discussed with pt

## 2012-09-27 DIAGNOSIS — M79609 Pain in unspecified limb: Secondary | ICD-10-CM | POA: Diagnosis not present

## 2012-10-11 ENCOUNTER — Encounter: Payer: Self-pay | Admitting: Internal Medicine

## 2012-10-11 ENCOUNTER — Other Ambulatory Visit: Payer: Self-pay | Admitting: Internal Medicine

## 2012-10-11 ENCOUNTER — Ambulatory Visit (INDEPENDENT_AMBULATORY_CARE_PROVIDER_SITE_OTHER): Payer: Medicare Other | Admitting: Internal Medicine

## 2012-10-11 VITALS — BP 122/74 | HR 75 | Temp 97.9°F | Wt 146.0 lb

## 2012-10-11 DIAGNOSIS — Z853 Personal history of malignant neoplasm of breast: Secondary | ICD-10-CM | POA: Diagnosis not present

## 2012-10-11 DIAGNOSIS — M899 Disorder of bone, unspecified: Secondary | ICD-10-CM | POA: Diagnosis not present

## 2012-10-11 DIAGNOSIS — M1990 Unspecified osteoarthritis, unspecified site: Secondary | ICD-10-CM

## 2012-10-11 NOTE — Assessment & Plan Note (Signed)
Unable to walk far due to arthritis,handicap sticker signed

## 2012-10-11 NOTE — Assessment & Plan Note (Addendum)
Mammogram and ultrasound 04-2012 normal.

## 2012-10-11 NOTE — Assessment & Plan Note (Signed)
DEXA  04-2012: T score is -2.1, slightly worse than 2011 when the T score was -1.8.  Plan:  continue to take calcium, vitamin D daily and Fosamax weekly.  Will consider d/c Fosamax at some point in the next year since she has been taking biphosphonates for at least 4 years.

## 2012-10-11 NOTE — Assessment & Plan Note (Signed)
Well-controlled, no change 

## 2012-10-11 NOTE — Progress Notes (Signed)
  Subjective:    Patient ID: Ariel Medina, female    DOB: [birth year], 79 y.o.   MRN: [id number]  HPI Doing well. Good med compliance w/o apparent s/e No amb BPs Had a MMG-US 9-13, report reviewed:  wnl  Past Medical History  Diagnosis Date  . Hyperlipidemia   . Hypertension   . Hypothyroidism   . Osteoarthritis   . Hiatal hernia   . Breast CA     left breast-invasive ductal ca stage 1-stopped arimidex 2008  . Diverticulitis of colon   . Syncope 2005    CT head (-), ECHO essent. neg, stress test (-), carotid u/s (-)  . Osteopenia     DEXA 7/02   Past Surgical History  Procedure Laterality Date  . Tonsillectomy and adenoidectomy    . Breast lumpectomy  04/2002    left     Family History: mother pass away  age 81   Diabetes -- M . dx late in life   MI-- B   at age 25 CVA-- F , age 105 . sister 32 colon ca--no Breast ca--no  Social History: son lives w/ her on-off Mikael Spray , one of my patients) Widow since 1993 still drives, ADL: independent Never Smoked Alcohol use-no   Review of Systems No CP-SOB No N-V-D    Objective:   Physical Exam BP 122/74  Pulse 75  Temp(Src) 97.9 F (36.6 C) (Oral)  Wt 146 lb (66.225 kg)  BMI 27.35 kg/m2  SpO2 98%  General -- alert, well-developed Lungs -- normal respiratory effort, no intercostal retractions, no accessory muscle use, and normal breath sounds.   Heart-- normal rate, regular rhythm, no murmur, and no gallop.   Extremities-- no pretibial edema noted today Psych-- Cognition and judgment appear intact. Alert and cooperative with normal attention span and concentration.  not anxious appearing and not depressed appearing.      Assessment & Plan:

## 2012-10-11 NOTE — Telephone Encounter (Signed)
Refill done.  

## 2012-10-22 ENCOUNTER — Other Ambulatory Visit: Payer: Self-pay | Admitting: Internal Medicine

## 2012-10-23 NOTE — Telephone Encounter (Signed)
Refill done.  

## 2012-12-08 ENCOUNTER — Other Ambulatory Visit: Payer: Self-pay | Admitting: Internal Medicine

## 2012-12-08 NOTE — Telephone Encounter (Signed)
Refill done.  

## 2012-12-22 ENCOUNTER — Ambulatory Visit (INDEPENDENT_AMBULATORY_CARE_PROVIDER_SITE_OTHER): Payer: Medicare Other | Admitting: Family Medicine

## 2012-12-22 ENCOUNTER — Encounter: Payer: Self-pay | Admitting: Family Medicine

## 2012-12-22 VITALS — BP 130/80 | HR 73 | Temp 97.8°F | Wt 147.2 lb

## 2012-12-22 DIAGNOSIS — M549 Dorsalgia, unspecified: Secondary | ICD-10-CM

## 2012-12-22 MED ORDER — CYCLOBENZAPRINE HCL 5 MG PO TABS: 5.0000 mg | ORAL_TABLET | Freq: Three times a day (TID) | ORAL | Status: DC | PRN

## 2012-12-22 NOTE — Progress Notes (Signed)
  Subjective:    Patient ID: Ariel Medina, female    DOB: [birth year], 79 y.o.   MRN: [id number]  HPI Back spasm- starts on R flank and radiates into her back.  Was out planting flowers in the yard earlier this week and sxs started yesterday.  Was unable to sleep due to pain.  Hx of similar.  'feels like my back is on fire'.  Has had flexeril in the past w/ good relief.  Took 2 Aleve and 10mg  Flexeril last night.  No dysuria, frequency, urgency.  Only has 1 flexeril remaining, would like refill.   Review of Systems For ROS see HPI     Objective:   Physical Exam  Vitals reviewed. Constitutional: She is oriented to person, place, and time. She appears well-developed and well-nourished. No distress.  Neurological: She is alert and oriented to person, place, and time. She has normal reflexes. No cranial nerve deficit. Coordination normal.  + paraspinal spasm Good flexion, pain w/ extension  Skin: Skin is warm and dry.          Assessment & Plan:

## 2012-12-22 NOTE — Patient Instructions (Addendum)
Continue the Aleve twice daily for inflammation Use the Cyclobenzaprine 2-3x/day for spasm- this may cause drowsiness Use the heating pad as needed- never on bare skin If no improvement or worsening, please call Hang in there!!!

## 2012-12-24 NOTE — Assessment & Plan Note (Addendum)
New to provider, recurrent for pt.  Pt reports flexeril provides good relief historically.  Refill provided.  NSAIDs prn.  Heating pad.  Reviewed supportive care and red flags that should prompt return.  Pt expressed understanding and is in agreement w/ plan.

## 2012-12-29 DIAGNOSIS — B351 Tinea unguium: Secondary | ICD-10-CM | POA: Diagnosis not present

## 2012-12-29 DIAGNOSIS — M79609 Pain in unspecified limb: Secondary | ICD-10-CM | POA: Diagnosis not present

## 2013-03-05 ENCOUNTER — Other Ambulatory Visit: Payer: Self-pay | Admitting: Internal Medicine

## 2013-03-06 NOTE — Telephone Encounter (Signed)
Okay to RF 1 year supply

## 2013-03-06 NOTE — Telephone Encounter (Signed)
Ok to refill?  Last OV 3.5.14 mentioned possibly d/c this medication within the year.  Last filled 5.2.14 #12 no refills.  Please advise.

## 2013-03-07 NOTE — Telephone Encounter (Signed)
Refill done per orders.  

## 2013-04-04 DIAGNOSIS — M79609 Pain in unspecified limb: Secondary | ICD-10-CM | POA: Diagnosis not present

## 2013-04-04 DIAGNOSIS — B351 Tinea unguium: Secondary | ICD-10-CM | POA: Diagnosis not present

## 2013-04-24 ENCOUNTER — Other Ambulatory Visit: Payer: Self-pay | Admitting: Internal Medicine

## 2013-04-24 NOTE — Telephone Encounter (Signed)
rx refilled per protocol. DJR  

## 2013-06-05 ENCOUNTER — Telehealth: Payer: Self-pay

## 2013-06-05 NOTE — Telephone Encounter (Addendum)
No Answer/unable to reach prior to visit   Td 2008 , pneumonia shot 02-2008, shingles shot --2012 PAP 2001 (-) , no h/o hysterectomy, no h/o abnormal PAP  CCS 2002, was rec next 2012  neg iFOB 7-10, 2011 and 05-2011 Bone Density 07/2012--Osteopenia

## 2013-06-07 ENCOUNTER — Ambulatory Visit (INDEPENDENT_AMBULATORY_CARE_PROVIDER_SITE_OTHER): Payer: Medicare Other | Admitting: Internal Medicine

## 2013-06-07 ENCOUNTER — Encounter: Payer: Self-pay | Admitting: Internal Medicine

## 2013-06-07 VITALS — BP 133/84 | HR 87 | Temp 97.5°F | Wt 147.0 lb

## 2013-06-07 DIAGNOSIS — Z0000 Encounter for general adult medical examination without abnormal findings: Secondary | ICD-10-CM

## 2013-06-07 DIAGNOSIS — Z23 Encounter for immunization: Secondary | ICD-10-CM

## 2013-06-07 DIAGNOSIS — E785 Hyperlipidemia, unspecified: Secondary | ICD-10-CM | POA: Diagnosis not present

## 2013-06-07 DIAGNOSIS — Z853 Personal history of malignant neoplasm of breast: Secondary | ICD-10-CM

## 2013-06-07 DIAGNOSIS — I10 Essential (primary) hypertension: Secondary | ICD-10-CM

## 2013-06-07 DIAGNOSIS — M1990 Unspecified osteoarthritis, unspecified site: Secondary | ICD-10-CM | POA: Diagnosis not present

## 2013-06-07 DIAGNOSIS — E039 Hypothyroidism, unspecified: Secondary | ICD-10-CM | POA: Diagnosis not present

## 2013-06-07 DIAGNOSIS — M899 Disorder of bone, unspecified: Secondary | ICD-10-CM | POA: Diagnosis not present

## 2013-06-07 LAB — COMPREHENSIVE METABOLIC PANEL
Albumin: 3.5 g/dL (ref 3.5–5.2)
BUN: 14 mg/dL (ref 6–23)
Calcium: 9.5 mg/dL (ref 8.4–10.5)
Chloride: 101 mEq/L (ref 96–112)
Glucose, Bld: 80 mg/dL (ref 70–99)
Potassium: 3.7 mEq/L (ref 3.5–5.1)

## 2013-06-07 LAB — CBC WITH DIFFERENTIAL/PLATELET
Basophils Relative: 0.6 % (ref 0.0–3.0)
Eosinophils Relative: 1.8 % (ref 0.0–5.0)
Lymphocytes Relative: 28 % (ref 12.0–46.0)
Monocytes Relative: 9.7 % (ref 3.0–12.0)
Neutrophils Relative %: 59.9 % (ref 43.0–77.0)
RBC: 4.44 Mil/uL (ref 3.87–5.11)
WBC: 4.3 10*3/uL — ABNORMAL LOW (ref 4.5–10.5)

## 2013-06-07 LAB — LIPID PANEL
Cholesterol: 219 mg/dL — ABNORMAL HIGH (ref 0–200)
Triglycerides: 76 mg/dL (ref 0.0–149.0)

## 2013-06-07 LAB — TSH: TSH: 1.62 u[IU]/mL (ref 0.35–5.50)

## 2013-06-07 NOTE — Assessment & Plan Note (Addendum)
Td 2008 , pneumonia shot 02-2008 , shingles shot --2012 flu shot today MMG -- see breast ca PAP 2001 (-) , see previous entry, no further PAPs  colon ca screening see previous entry--->  No further screening planned  Diet and exercise discussed

## 2013-06-07 NOTE — Assessment & Plan Note (Signed)
Continue calcium, vitamin D and Fosamax, see previous entry

## 2013-06-07 NOTE — Assessment & Plan Note (Addendum)
Blood pressure seems to be well-controlled, she has mild edema but seems at baseline. Check labs EKG today at baseline

## 2013-06-07 NOTE — Progress Notes (Signed)
Subjective:    Patient ID: Ariel Medina, female    DOB: 06/19/1928, 79 y.o.   MRN: [id number]  HPI  Here for Medicare AWV: 1. Risk factors based on Past M, S, F history: yes   2. Physical Activities: active at home,independent on ADL, still drives   3. Depression/mood: neg screening  4. Hearing:mild decrease R hearing , declined referral, no problems noted w/ normal conversation 5. ADL's: independent on all her ADLs 6. Fall Risk: no recent falls, prevention discussed    7. Home Safety: feels safe at home    8. Height, weight, &visual acuity:see VS, no problems with vision, sees eye doctor regulalrly 9. Counseling: provided , see below 10. Labs ordered based on risk factors: yes   11. Referral Coordination: if needed   12. Care Plan--- see A/P   13. Cognitive Assessment :  memory, alertness wnl; motor skill seems appropriate for age, gait-- small steps , slt unsteady    In addition, we discussed the following HTN --good medication compliance, not ambulatory BPs High cholesterol--on Crestor or 3 times a week, no apparent side effects DJD-- takes Tylenol as needed, also takes Aleve sometimes. I recommend to discontinue Aleve, see assessment and plan. Hypothyroidism--good compliance with Synthroid Osteopenia--calcium, vitamin D and Fosamax.  Past Medical History  Diagnosis Date  . Hyperlipidemia   . Hypertension   . Hypothyroidism   . Osteoarthritis   . Hiatal hernia   . Breast CA     left breast-invasive ductal ca stage 1-stopped arimidex 2008  . Diverticulitis of colon   . Syncope 2005    CT head (-), ECHO essent. neg, stress test (-), carotid u/s (-)  . Osteopenia     DEXA 7/02   Past Surgical History  Procedure Laterality Date  . Tonsillectomy and adenoidectomy    . Breast lumpectomy  04/2002    left   History   Social History  . Marital Status: Widowed    Spouse Name: N/A    Number of Children: 2  . Years of Education: N/A   Occupational History  . n/a     Social History Main Topics  . Smoking status: Never Smoker   . Smokeless tobacco: Never Used  . Alcohol Use: No  . Drug Use: No  . Sexual Activity: Not on file   Other Topics Concern  . Not on file   Social History Narrative   Lost a son, her other son lives w/ her on-off Mikael Spray , one of my patients)   Widow since 42         Family History  Problem Relation Age of Onset  . Heart attack Brother 56  . Diabetes Mother   . Stroke Father 57  . Stroke Sister 57  . Colon cancer Neg Hx   . Breast cancer Neg Hx       Review of Systems No  CP, SOB, lower extremity edema Denies  nausea, vomiting diarrhea Denies  blood in the stools (-) cough, sputum production, (-) wheezing, chest congestion No dysuria, gross hematuria, difficulty urinating         Objective:   Physical Exam BP 133/84  Pulse 87  Temp(Src) 97.5 F (36.4 C)  Wt 147 lb (66.679 kg)  BMI 27.54 kg/m2  SpO2 95% General -- alert, well-developed, NAD.  Neck --no thyromegaly Breast-- no dominant mass, skin and nipples normal to inspection on palpation, axillary areas without mass or lymphadenopathy Lungs -- normal respiratory effort, no intercostal  retractions, no accessory muscle use, and normal breath sounds.  Heart-- normal rate, regular rhythm, no murmur.  Abdomen-- Not distended, good bowel sounds,soft, non-tender. Extremities-- trace pretibial and ankle edema R>L Neurologic--  alert & oriented X3. Speech normal, + jaw tremor  Psych--  . No anxious appearing , no depressed appearing.      Assessment & Plan:

## 2013-06-07 NOTE — Assessment & Plan Note (Signed)
On Crestor 3 times a week, labs

## 2013-06-07 NOTE — Patient Instructions (Signed)
Get your blood work before you leave  Next visit in 6 months  hypertension  follow up . No Fasting Please make an appointment       Fall Prevention and Home Safety Falls cause injuries and can affect all age groups. It is possible to use preventive measures to significantly decrease the likelihood of falls. There are many simple measures which can make your home safer and prevent falls. OUTDOORS  Repair cracks and edges of walkways and driveways.  Remove high doorway thresholds.  Trim shrubbery on the main path into your home.  Have good outside lighting.  Clear walkways of tools, rocks, debris, and clutter.  Check that handrails are not broken and are securely fastened. Both sides of steps should have handrails.  Have leaves, snow, and ice cleared regularly.  Use sand or salt on walkways during winter months.  In the garage, clean up grease or oil spills. BATHROOM  Install night lights.  Install grab bars by the toilet and in the tub and shower.  Use non-skid mats or decals in the tub or shower.  Place a plastic non-slip stool in the shower to sit on, if needed.  Keep floors dry and clean up all water on the floor immediately.  Remove soap buildup in the tub or shower on a regular basis.  Secure bath mats with non-slip, double-sided rug tape.  Remove throw rugs and tripping hazards from the floors. BEDROOMS  Install night lights.  Make sure a bedside light is easy to reach.  Do not use oversized bedding.  Keep a telephone by your bedside.  Have a firm chair with side arms to use for getting dressed.  Remove throw rugs and tripping hazards from the floor. KITCHEN  Keep handles on pots and pans turned toward the center of the stove. Use back burners when possible.  Clean up spills quickly and allow time for drying.  Avoid walking on wet floors.  Avoid hot utensils and knives.  Position shelves so they are not too high or low.  Place commonly used  objects within easy reach.  If necessary, use a sturdy step stool with a grab bar when reaching.  Keep electrical cables out of the way.  Do not use floor polish or wax that makes floors slippery. If you must use wax, use non-skid floor wax.  Remove throw rugs and tripping hazards from the floor. STAIRWAYS  Never leave objects on stairs.  Place handrails on both sides of stairways and use them. Fix any loose handrails. Make sure handrails on both sides of the stairways are as long as the stairs.  Check carpeting to make sure it is firmly attached along stairs. Make repairs to worn or loose carpet promptly.  Avoid placing throw rugs at the top or bottom of stairways, or properly secure the rug with carpet tape to prevent slippage. Get rid of throw rugs, if possible.  Have an electrician put in a light switch at the top and bottom of the stairs. OTHER FALL PREVENTION TIPS  Wear low-heel or rubber-soled shoes that are supportive and fit well. Wear closed toe shoes.  When using a stepladder, make sure it is fully opened and both spreaders are firmly locked. Do not climb a closed stepladder.  Add color or contrast paint or tape to grab bars and handrails in your home. Place contrasting color strips on first and last steps.  Learn and use mobility aids as needed. Install an electrical emergency response system.  Turn  Turn on lights to avoid dark areas. Replace light bulbs that burn out immediately. Get light switches that glow.  Arrange furniture to create clear pathways. Keep furniture in the same place.  Firmly attach carpet with non-skid or double-sided tape.  Eliminate uneven floor surfaces.  Select a carpet pattern that does not visually hide the edge of steps.  Be aware of all pets. OTHER HOME SAFETY TIPS  Set the water temperature for 120 F (48.8 C).  Keep emergency numbers on or near the telephone.  Keep smoke detectors on every level of the home and near sleeping  areas. Document Released: 07/16/2002 Document Revised: 01/25/2012 Document Reviewed: 10/15/2011 [employer] Patient Information 2014 [employer].  

## 2013-06-07 NOTE — Assessment & Plan Note (Signed)
Breast exam today unremarkable, has a mammogram scheduled for this year.

## 2013-06-07 NOTE — Assessment & Plan Note (Addendum)
Good compliance of medication, check a TSH 

## 2013-06-07 NOTE — Assessment & Plan Note (Signed)
On Tylenol and occasional Aleve, I recommend to discontinue aleve d/t risk of kidney damage and a history of bleeding with ibuprofen. Patient agreed

## 2013-06-08 ENCOUNTER — Encounter: Payer: Self-pay | Admitting: Internal Medicine

## 2013-06-29 DIAGNOSIS — Z1231 Encounter for screening mammogram for malignant neoplasm of breast: Secondary | ICD-10-CM | POA: Diagnosis not present

## 2013-07-04 ENCOUNTER — Ambulatory Visit: Payer: Self-pay | Admitting: Podiatrist

## 2013-07-25 ENCOUNTER — Ambulatory Visit: Payer: Self-pay | Admitting: Podiatrist

## 2013-07-26 ENCOUNTER — Ambulatory Visit: Payer: Medicare Other | Admitting: Podiatrist

## 2013-08-01 ENCOUNTER — Ambulatory Visit (INDEPENDENT_AMBULATORY_CARE_PROVIDER_SITE_OTHER): Payer: Medicare Other | Admitting: Podiatrist

## 2013-08-01 ENCOUNTER — Ambulatory Visit: Payer: Medicare Other | Admitting: Podiatrist

## 2013-08-01 ENCOUNTER — Encounter: Payer: Self-pay | Admitting: Podiatrist

## 2013-08-01 VITALS — BP 157/96 | HR 76 | Resp 18 | Ht 63.0 in | Wt 147.0 lb

## 2013-08-01 DIAGNOSIS — M79609 Pain in unspecified limb: Secondary | ICD-10-CM

## 2013-08-01 DIAGNOSIS — B351 Tinea unguium: Secondary | ICD-10-CM

## 2013-08-01 NOTE — Progress Notes (Signed)
HPI:  Patient presents today for follow up of foot and nail care. Denies any new complaints today.  Objective:  Patients chart is reviewed.  Neurovascular status unchanged.  Patients nails are thickened, discolored, distrophic, friable and brittle with yellow-[place] discoloration. Patient subjectively relates they are painful with shoes and with ambulation of bilateral feet.  Assessment:  Symptomatic onychomycosis  Plan:  Discussed treatment options and alternatives.  The symptomatic toenails were debrided through manual an mechanical means without complication.  Return appointment recommended at routine intervals of 3 months    Kathryn Egerton, DPM   

## 2013-10-05 ENCOUNTER — Encounter: Payer: Self-pay | Admitting: Internal Medicine

## 2013-10-05 ENCOUNTER — Ambulatory Visit (INDEPENDENT_AMBULATORY_CARE_PROVIDER_SITE_OTHER): Payer: Medicare Other | Admitting: Internal Medicine

## 2013-10-05 ENCOUNTER — Ambulatory Visit (HOSPITAL_BASED_OUTPATIENT_CLINIC_OR_DEPARTMENT_OTHER)
Admission: RE | Admit: 2013-10-05 | Discharge: 2013-10-05 | Disposition: A | Discharge status: Home / Self Care | Payer: Medicare Other | Source: Ambulatory Visit | Attending: Internal Medicine | Admitting: Internal Medicine

## 2013-10-05 VITALS — BP 145/91 | HR 86 | Temp 97.4°F | Wt 148.0 lb

## 2013-10-05 DIAGNOSIS — M7989 Other specified soft tissue disorders: Secondary | ICD-10-CM | POA: Insufficient documentation

## 2013-10-05 DIAGNOSIS — I809 Phlebitis and thrombophlebitis of unspecified site: Secondary | ICD-10-CM | POA: Diagnosis not present

## 2013-10-05 DIAGNOSIS — M79609 Pain in unspecified limb: Secondary | ICD-10-CM | POA: Diagnosis not present

## 2013-10-05 DIAGNOSIS — I83893 Varicose veins of bilateral lower extremities with other complications: Secondary | ICD-10-CM | POA: Diagnosis not present

## 2013-10-05 NOTE — Assessment & Plan Note (Addendum)
Symptoms consistent with superficial phlebitis at the mid pretibial area. Will do ultrasound to be sure there is not a superficial or deep thrombus. Otherwise will treat conservatively. See instructions. Addendum, u/s was neg

## 2013-10-05 NOTE — Patient Instructions (Signed)
Get the ultrasound today  Elevate your leg 1 hour 3 times a day  Aspirin 81 mg one tablet 3 times a day x 1 week, then back to 1 tab a day. Always take it with food because may cause gastritis and ulcers. If you notice nausea, stomach pain, change in the color of stools --->  Stop the medicine and let us know  Call if the pain  is not improving within 4 or 5 days, call if  develop redness, warmness, fever, chills or leg swelling

## 2013-10-05 NOTE — Progress Notes (Signed)
Subjective:    Patient ID: Ariel Medina, female    DOB: 1928/07/29, 80 y.o.   MRN: [id number]  DOS:  10/05/2013 Reason for visit: Acute visit      10 days history of leg pain, initially bilaterally, now localized at the anterior left pretibial area, in the past she had phlebitis and that is what she thinks she has. Denies any warmness or redness. She has increased aspirin 81 mg to twice a day.  ROS No fever or chills. No swelling. No claudication. No injury. Denies chest pain, shortness or breath at baseline, no palpitations. Currently without abdominal pain, nausea or vomiting.  Past Medical History  Diagnosis Date  . Hyperlipidemia   . Hypertension   . Hypothyroidism   . Osteoarthritis   . Hiatal hernia   . Breast CA     left breast-invasive ductal ca stage 1-stopped arimidex 2008  . Diverticulitis of colon   . Syncope 2005    CT head (-), ECHO essent. neg, stress test (-), carotid u/s (-)  . Osteopenia     DEXA 7/02    Past Surgical History  Procedure Laterality Date  . Tonsillectomy and adenoidectomy    . Breast lumpectomy  04/2002    left    History   Social History  . Marital Status: Widowed    Spouse Name: N/A    Number of Children: 2  . Years of Education: N/A   Occupational History  . n/a    Social History Main Topics  . Smoking status: Never Smoker   . Smokeless tobacco: Never Used  . Alcohol Use: No  . Drug Use: No  . Sexual Activity: Not on file   Other Topics Concern  . Not on file   Social History Narrative   Lost a son, her other son lives w/ her on-off Ariel Medina , one of my patients)   Widow since 1993              Medication List       This list is accurate as of: 10/05/13 11:59 PM.  Always use your most recent med list.               acetaminophen 500 MG tablet  Commonly known as:  TYLENOL  Take 500 mg by mouth every 6 (six) hours as needed.     alendronate 70 MG tablet  Commonly known as:  FOSAMAX  TAKE 1 TABLET  BY MOUTH EVERY WEEK WITH A FULL GLASS OF WATER ON AN EMPTY STOMACH     aspirin 81 MG tablet  Take 81 mg by mouth daily.     CALTRATE 600 PO  Take by mouth.     CRESTOR 10 MG tablet  Generic drug:  rosuvastatin  USE AS DIRECTED     hydrochlorothiazide 25 MG tablet  Commonly known as:  HYDRODIURIL  TAKE 1 TABLET EVERY DAY     levothyroxine 75 MCG tablet  Commonly known as:  SYNTHROID, LEVOTHROID  TAKE 1 TABLET (75 MCG TOTAL) BY MOUTH DAILY.     VITAMIN D (CHOLECALCIFEROL) PO  Take by mouth.           Objective:   Physical Exam  Musculoskeletal:       Legs:  BP 145/91  Pulse 86  Temp(Src) 97.4 F (36.3 C)  Wt 148 lb (67.132 kg)  SpO2 96%  General -- alert, well-developed, NAD.    Extremities-- no pretibial pitting edema bilaterally , calves not  TTP and measured --->symmetric . Good bilateral pedal pulses. She does have varicose veins-- see graphic  Neurologic--  alert & oriented X3. Speech normal, gait and strength at baseline  in all extremities.  Psych-- Cognition and judgment appear intact. Cooperative with normal attention span and concentration. No anxious or depressed appearing.       Assessment & Plan:

## 2013-10-05 NOTE — Progress Notes (Signed)
Pre visit review using our clinic review tool, if applicable. No additional management support is needed unless otherwise documented below in the visit note. 

## 2013-10-17 ENCOUNTER — Other Ambulatory Visit: Payer: Self-pay | Admitting: Internal Medicine

## 2013-10-25 ENCOUNTER — Encounter: Payer: Self-pay | Admitting: Podiatrist

## 2013-10-25 ENCOUNTER — Ambulatory Visit (INDEPENDENT_AMBULATORY_CARE_PROVIDER_SITE_OTHER): Payer: Medicare Other | Admitting: Podiatrist

## 2013-10-25 VITALS — BP 132/79 | HR 83 | Resp 18

## 2013-10-25 DIAGNOSIS — M79609 Pain in unspecified limb: Secondary | ICD-10-CM | POA: Diagnosis not present

## 2013-10-25 DIAGNOSIS — B351 Tinea unguium: Secondary | ICD-10-CM

## 2013-10-25 NOTE — Progress Notes (Signed)
I am here to have my toenails cut and also my calluses need to be trimmed  HPI:  Patient presents today for follow up of foot and nail care. Denies any new complaints today.  Objective:  Patients chart is reviewed.  Vascular status reveals pedal pulses noted at 1/4 dp and pt bilateral .  variscosities present bilateral Neurological sensation is Normal to [employer] monofilament bilateral.  Patients nails are thickened, discolored, distrophic, friable and brittle with yellow-[place] discoloration. Patient subjectively relates they are painful with shoes and with ambulation of bilateral feet. She has painful hyperkeratotic lesions present submetatarsal 1 and 5 bilateral which are also symptomatic and painful and cause difficulty with ambulation  Assessment:  Symptomatic onychomycosis  Plan:  Discussed treatment options and alternatives.  The symptomatic toenails were debrided through manual an mechanical means without complication.  Return appointment recommended at routine intervals of 3 months    Trudie Buckler, DPM

## 2013-12-06 ENCOUNTER — Ambulatory Visit: Payer: Medicare Other | Admitting: Internal Medicine

## 2013-12-13 ENCOUNTER — Encounter: Payer: Self-pay | Admitting: Internal Medicine

## 2013-12-13 ENCOUNTER — Ambulatory Visit (INDEPENDENT_AMBULATORY_CARE_PROVIDER_SITE_OTHER): Payer: Medicare Other | Admitting: Internal Medicine

## 2013-12-13 VITALS — BP 142/82 | HR 74 | Temp 97.7°F | Wt 144.2 lb

## 2013-12-13 DIAGNOSIS — M549 Dorsalgia, unspecified: Secondary | ICD-10-CM

## 2013-12-13 DIAGNOSIS — M899 Disorder of bone, unspecified: Secondary | ICD-10-CM | POA: Diagnosis not present

## 2013-12-13 DIAGNOSIS — I10 Essential (primary) hypertension: Secondary | ICD-10-CM | POA: Diagnosis not present

## 2013-12-13 DIAGNOSIS — M949 Disorder of cartilage, unspecified: Principal | ICD-10-CM

## 2013-12-13 LAB — BASIC METABOLIC PANEL
BUN: 13 mg/dL (ref 6–23)
CHLORIDE: 98 meq/L (ref 96–112)
CO2: 32 mEq/L (ref 19–32)
Calcium: 9.2 mg/dL (ref 8.4–10.5)
Creatinine, Ser: 0.8 mg/dL (ref 0.4–1.2)
GFR: 77.12 mL/min (ref >60.00)
GLUCOSE: 82 mg/dL (ref 70–99)
Potassium: 3.5 mEq/L (ref 3.5–5.1)
Sodium: 137 mEq/L (ref 135–145)

## 2013-12-13 MED ORDER — CYCLOBENZAPRINE HCL 5 MG PO TABS: 5.0000 mg | ORAL_TABLET | Freq: Two times a day (BID) | ORAL | Status: AC | PRN

## 2013-12-13 NOTE — Assessment & Plan Note (Signed)
On diuretics, check a BMP

## 2013-12-13 NOTE — Progress Notes (Signed)
Subjective:    Patient ID: Ariel Medina, female    DOB: 20-Nov-1927, 80 y.o.   MRN: [id number]  DOS:  12/13/2013 Type of  visit: Routine office visit Good compliance with chronic medications. She was moving some wood and since then has developed a R shoulder pain, in the past, she tried Flexeril with good results and without being too sleepy. Request a refill.    ROS Denies chest pain or difficulty breathing No nausea, vomiting, diarrhea or blood in the stools  Past Medical History  Diagnosis Date  . Hyperlipidemia   . Hypertension   . Hypothyroidism   . Osteoarthritis   . Hiatal hernia   . Breast CA     left breast-invasive ductal ca stage 1-stopped arimidex 2008  . Diverticulitis of colon   . Syncope 2005    CT head (-), ECHO essent. neg, stress test (-), carotid u/s (-)  . Osteopenia     DEXA 7/02    Past Surgical History  Procedure Laterality Date  . Tonsillectomy and adenoidectomy    . Breast lumpectomy  04/2002    left    History   Social History  . Marital Status: Widowed    Spouse Name: N/A    Number of Children: 2  . Years of Education: N/A   Occupational History  . n/a    Social History Main Topics  . Smoking status: Never Smoker   . Smokeless tobacco: Never Used  . Alcohol Use: No  . Drug Use: No  . Sexual Activity: Not on file   Other Topics Concern  . Not on file   Social History Narrative   Lost a son, her other son lives w/ her on-off Ariel Medina , one of my patients)   Widow since 1993              Medication List       This list is accurate as of: 12/13/13  2:07 PM.  Always use your most recent med list.               acetaminophen 500 MG tablet  Commonly known as:  TYLENOL  Take 500 mg by mouth every 6 (six) hours as needed.     aspirin 81 MG tablet  Take 81 mg by mouth daily.     CALTRATE 600 PO  Take by mouth.     CRESTOR 10 MG tablet  Generic drug:  rosuvastatin  USE AS DIRECTED     cyclobenzaprine 5 MG  tablet  Commonly known as:  FLEXERIL  Take 1 tablet (5 mg total) by mouth 2 (two) times daily as needed for muscle spasms.     hydrochlorothiazide 25 MG tablet  Commonly known as:  HYDRODIURIL  TAKE 1 TABLET EVERY DAY     levothyroxine 75 MCG tablet  Commonly known as:  SYNTHROID, LEVOTHROID  TAKE 1 TABLET (75 MCG TOTAL) BY MOUTH DAILY.     VITAMIN D (CHOLECALCIFEROL) PO  Take by mouth.           Objective:   Physical Exam BP 142/82  Pulse 74  Temp(Src) 97.7 F (36.5 C) (Oral)  Wt 144 lb 3.2 oz (65.409 kg)  SpO2 96%  General -- alert, well-developed, NAD.   Lungs -- normal respiratory effort, no intercostal retractions, no accessory muscle use, and normal breath sounds.  Heart-- normal rate, regular rhythm, no murmur.  Extremities-- no pretibial edema bilaterally ; shoulders ROM grossly intact Neurologic--  alert &  oriented X3.  Psych--   No anxious or depressed appearing.     Assessment & Plan:

## 2013-12-13 NOTE — Patient Instructions (Signed)
Get your blood work before you leave   STOP Fosamax  Take calcium  1 g daily and vitamin D between 600 units and 1000 units daily Flexeril as needed, stop Flexeril if it make it too sleepy  Next visit is for a physical exam in 5 months  ,  fasting Please make an appointment

## 2013-12-13 NOTE — Progress Notes (Signed)
Pre visit review using our clinic review tool, if applicable. No additional management support is needed unless otherwise documented below in the visit note. 

## 2013-12-13 NOTE — Assessment & Plan Note (Signed)
Developed mild shoulder pain few days ago after moving wood , request a prescription for Flexeril, I agreed to prescribe 5 mg twice a day, as needed only, warned about excessive somnolence

## 2013-12-13 NOTE — Assessment & Plan Note (Addendum)
DEXA 04-2012: T score is -2.1, slightly worse than 2011 when the T score was -1.8.  Plan: d/c Fosamax after ~ 5 years (holyday) DEXA due ~ 04-2014 Ca and vit D discussed

## 2013-12-14 ENCOUNTER — Telehealth: Payer: Self-pay | Admitting: Internal Medicine

## 2013-12-14 NOTE — Telephone Encounter (Signed)
emmi mailed to patient

## 2013-12-17 ENCOUNTER — Telehealth: Payer: Self-pay | Admitting: Internal Medicine

## 2013-12-17 NOTE — Telephone Encounter (Signed)
Patient notified. States that there is no rash. She will give it a few more days and if still has concerns will schedule appointment.

## 2013-12-17 NOTE — Telephone Encounter (Signed)
Patient states that she is having very painful burning in her back. There is no itching or redness, just pain.   Please Advise

## 2013-12-17 NOTE — Telephone Encounter (Signed)
Needs to be seen, very hard to assert what the problem is over the phone. If   she has a rash she indeed  may have shingles

## 2013-12-17 NOTE — Telephone Encounter (Signed)
Caller name:Truda Sobecki Relation to TA:[id number] Call back Carnuel:  Reason for call: patient called and stated that she is still experiencing back pain from her visit with [place] on 12/14/2043. Patient is concerned that it may be shingles and would like to talk to a nurse.

## 2014-01-16 ENCOUNTER — Other Ambulatory Visit: Payer: Self-pay | Admitting: *Deleted

## 2014-01-16 MED ORDER — ROSUVASTATIN CALCIUM 10 MG PO TABS: ORAL_TABLET | ORAL | Status: DC

## 2014-01-16 NOTE — Telephone Encounter (Signed)
Rx sent to the pharmacy by e-script.//AB/CMA 

## 2014-01-20 ENCOUNTER — Other Ambulatory Visit: Payer: Self-pay | Admitting: Internal Medicine

## 2014-01-24 ENCOUNTER — Ambulatory Visit (INDEPENDENT_AMBULATORY_CARE_PROVIDER_SITE_OTHER): Payer: Medicare Other | Admitting: Podiatrist

## 2014-01-24 DIAGNOSIS — M79609 Pain in unspecified limb: Secondary | ICD-10-CM

## 2014-01-24 DIAGNOSIS — B351 Tinea unguium: Secondary | ICD-10-CM | POA: Diagnosis not present

## 2014-01-24 DIAGNOSIS — M79606 Pain in leg, unspecified: Secondary | ICD-10-CM

## 2014-01-26 NOTE — Progress Notes (Signed)
HPI: Patient presents today for follow up of foot and nail care. Denies any new complaints today.  Objective: Patients chart is reviewed. Vascular status reveals pedal pulses noted at 1/4 dp and pt bilateral . variscosities present bilateral Neurological sensation is Normal to [employer] monofilament bilateral. Patients nails are thickened, discolored, distrophic, friable and brittle with yellow-[place] discoloration. Patient subjectively relates they are painful with shoes and with ambulation of bilateral feet. She has painful hyperkeratotic lesions present submetatarsal 1 and 5 bilateral which are also symptomatic and painful and cause difficulty with ambulation - fat pad atrophy is present bilateral Assessment: Symptomatic onychomycosis  Plan: Discussed treatment options and alternatives. The symptomatic toenails were debrided through manual an mechanical means without complication. Return appointment recommended at routine intervals of 3 months

## 2014-04-04 ENCOUNTER — Telehealth: Payer: Self-pay

## 2014-04-04 ENCOUNTER — Other Ambulatory Visit: Payer: Self-pay

## 2014-04-04 MED ORDER — HYDROCHLOROTHIAZIDE 25 MG PO TABS: ORAL_TABLET | ORAL | Status: DC

## 2014-04-04 NOTE — Telephone Encounter (Signed)
Spoke with Pts son, informed him that Pts medication HCTZ has been recalled and to stop taking her current bottle of medication, sent in new prescription of HCTZ to pharmacy.

## 2014-04-05 ENCOUNTER — Telehealth: Payer: Self-pay

## 2014-04-05 NOTE — Telephone Encounter (Signed)
Spoke with Pt about HCTZ recall, she will pick up new Rx today.

## 2014-04-05 NOTE — Telephone Encounter (Signed)
Jalyssa returned your call. [phone]

## 2014-05-09 ENCOUNTER — Ambulatory Visit (INDEPENDENT_AMBULATORY_CARE_PROVIDER_SITE_OTHER): Payer: Medicare Other | Admitting: Podiatrist

## 2014-05-09 ENCOUNTER — Encounter: Payer: Self-pay | Admitting: Podiatrist

## 2014-05-09 DIAGNOSIS — B351 Tinea unguium: Secondary | ICD-10-CM | POA: Diagnosis not present

## 2014-05-09 DIAGNOSIS — M79676 Pain in unspecified toe(s): Secondary | ICD-10-CM

## 2014-05-09 NOTE — Progress Notes (Signed)
HPI: Patient presents today for follow up of foot and nail care. Denies any new complaints today.  Objective: Patients chart is reviewed. Vascular status reveals pedal pulses noted at 1/4 dp and pt bilateral . variscosities present bilateral Neurological sensation is Normal to [employer] monofilament bilateral. Patients nails are thickened, discolored, distrophic, friable and brittle with yellow-[place] discoloration. Patient subjectively relates they are painful with shoes and with ambulation of bilateral feet. She has painful hyperkeratotic lesions present submetatarsal 1 and 5 bilateral which are also symptomatic and painful and cause difficulty with ambulation - fat pad atrophy is present bilateral  Assessment: Symptomatic onychomycosis  Plan: Discussed treatment options and alternatives. The symptomatic toenails were debrided through manual an mechanical means without complication. Return appointment recommended at routine intervals of 3 months

## 2014-06-10 ENCOUNTER — Ambulatory Visit (INDEPENDENT_AMBULATORY_CARE_PROVIDER_SITE_OTHER): Payer: Medicare Other | Admitting: Internal Medicine

## 2014-06-10 ENCOUNTER — Ambulatory Visit (INDEPENDENT_AMBULATORY_CARE_PROVIDER_SITE_OTHER): Payer: Medicare Other

## 2014-06-10 ENCOUNTER — Encounter: Payer: Self-pay | Admitting: Internal Medicine

## 2014-06-10 VITALS — BP 141/80 | HR 75 | Temp 98.1°F | Ht 63.0 in | Wt 144.0 lb

## 2014-06-10 DIAGNOSIS — Z23 Encounter for immunization: Secondary | ICD-10-CM | POA: Diagnosis not present

## 2014-06-10 DIAGNOSIS — Z1239 Encounter for other screening for malignant neoplasm of breast: Secondary | ICD-10-CM

## 2014-06-10 DIAGNOSIS — I10 Essential (primary) hypertension: Secondary | ICD-10-CM

## 2014-06-10 DIAGNOSIS — Z0000 Encounter for general adult medical examination without abnormal findings: Secondary | ICD-10-CM | POA: Diagnosis not present

## 2014-06-10 DIAGNOSIS — E785 Hyperlipidemia, unspecified: Secondary | ICD-10-CM | POA: Diagnosis not present

## 2014-06-10 DIAGNOSIS — C50919 Malignant neoplasm of unspecified site of unspecified female breast: Secondary | ICD-10-CM | POA: Diagnosis not present

## 2014-06-10 DIAGNOSIS — E033 Postinfectious hypothyroidism: Secondary | ICD-10-CM

## 2014-06-10 DIAGNOSIS — Z853 Personal history of malignant neoplasm of breast: Secondary | ICD-10-CM | POA: Diagnosis not present

## 2014-06-10 DIAGNOSIS — Z780 Asymptomatic menopausal state: Secondary | ICD-10-CM

## 2014-06-10 DIAGNOSIS — M8580 Other specified disorders of bone density and structure, unspecified site: Secondary | ICD-10-CM | POA: Diagnosis not present

## 2014-06-10 LAB — BASIC METABOLIC PANEL
BUN: 14 mg/dL (ref 6–23)
CALCIUM: 9.3 mg/dL (ref 8.4–10.5)
CO2: 28 meq/L (ref 19–32)
CREATININE: 0.8 mg/dL (ref 0.4–1.2)
Chloride: 100 mEq/L (ref 96–112)
GFR: 73.66 mL/min (ref >60.00)
GLUCOSE: 73 mg/dL (ref 70–99)
Potassium: 3.3 mEq/L — ABNORMAL LOW (ref 3.5–5.1)
Sodium: 137 mEq/L (ref 135–145)

## 2014-06-10 LAB — AST: AST: 19 U/L (ref 0–37)

## 2014-06-10 LAB — LIPID PANEL
Cholesterol: 232 mg/dL — ABNORMAL HIGH (ref 0–200)
HDL: 59.8 mg/dL (ref >39.00)
LDL CALC: 155 mg/dL — AB (ref 0–99)
NonHDL: 172.2
TRIGLYCERIDES: 86 mg/dL (ref 0.0–149.0)
Total CHOL/HDL Ratio: 4
VLDL: 17.2 mg/dL (ref 0.0–40.0)

## 2014-06-10 LAB — ALT: ALT: 9 U/L (ref 0–35)

## 2014-06-10 LAB — TSH: TSH: 0.67 u[IU]/mL (ref 0.35–4.50)

## 2014-06-10 NOTE — Assessment & Plan Note (Signed)
Continue with hydrochlorothiazide, check a BMP

## 2014-06-10 NOTE — Patient Instructions (Signed)
Get your blood work before you leave   Please come back to the office  In 6 months for a routine check up , no  fasting       Fall Prevention and Home Safety Falls cause injuries and can affect all age groups. It is possible to use preventive measures to significantly decrease the likelihood of falls. There are many simple measures which can make your home safer and prevent falls. OUTDOORS  Repair cracks and edges of walkways and driveways.  Remove high doorway thresholds.  Trim shrubbery on the main path into your home.  Have good outside lighting.  Clear walkways of tools, rocks, debris, and clutter.  Check that handrails are not broken and are securely fastened. Both sides of steps should have handrails.  Have leaves, snow, and ice cleared regularly.  Use sand or salt on walkways during winter months.  In the garage, clean up grease or oil spills. BATHROOM  Install night lights.  Install grab bars by the toilet and in the tub and shower.  Use non-skid mats or decals in the tub or shower.  Place a plastic non-slip stool in the shower to sit on, if needed.  Keep floors dry and clean up all water on the floor immediately.  Remove soap buildup in the tub or shower on a regular basis.  Secure bath mats with non-slip, double-sided rug tape.  Remove throw rugs and tripping hazards from the floors. BEDROOMS  Install night lights.  Make sure a bedside light is easy to reach.  Do not use oversized bedding.  Keep a telephone by your bedside.  Have a firm chair with side arms to use for getting dressed.  Remove throw rugs and tripping hazards from the floor. KITCHEN  Keep handles on pots and pans turned toward the center of the stove. Use back burners when possible.  Clean up spills quickly and allow time for drying.  Avoid walking on wet floors.  Avoid hot utensils and knives.  Position shelves so they are not too high or low.  Place commonly used objects  within easy reach.  If necessary, use a sturdy step stool with a grab bar when reaching.  Keep electrical cables out of the way.  Do not use floor polish or wax that makes floors slippery. If you must use wax, use non-skid floor wax.  Remove throw rugs and tripping hazards from the floor. STAIRWAYS  Never leave objects on stairs.  Place handrails on both sides of stairways and use them. Fix any loose handrails. Make sure handrails on both sides of the stairways are as long as the stairs.  Check carpeting to make sure it is firmly attached along stairs. Make repairs to worn or loose carpet promptly.  Avoid placing throw rugs at the top or bottom of stairways, or properly secure the rug with carpet tape to prevent slippage. Get rid of throw rugs, if possible.  Have an electrician put in a light switch at the top and bottom of the stairs. OTHER FALL PREVENTION TIPS  Wear low-heel or rubber-soled shoes that are supportive and fit well. Wear closed toe shoes.  When using a stepladder, make sure it is fully opened and both spreaders are firmly locked. Do not climb a closed stepladder.  Add color or contrast paint or tape to grab bars and handrails in your home. Place contrasting color strips on first and last steps.  Learn and use mobility aids as needed. Install an electrical emergency response  system.  Turn on lights to avoid dark areas. Replace light bulbs that burn out immediately. Get light switches that glow.  Arrange furniture to create clear pathways. Keep furniture in the same place.  Firmly attach carpet with non-skid or double-sided tape.  Eliminate uneven floor surfaces.  Select a carpet pattern that does not visually hide the edge of steps.  Be aware of all pets. OTHER HOME SAFETY TIPS  Set the water temperature for 120 F (48.8 C).  Keep emergency numbers on or near the telephone.  Keep smoke detectors on every level of the home and near sleeping  areas. Document Released: 07/16/2002 Document Revised: 01/25/2012 Document Reviewed: 10/15/2011 [facility] Patient Information 2015 [place], [place]. This information is not intended to replace advice given to you by your health care provider. Make sure you discuss any questions you have with your health care provider.     Preventive Care for Adults   Ages 32 years and over  Blood pressure check.** / Every 1 to 2 years.  Lipid and cholesterol check.** / Every 5 years beginning at age 68 years.  Lung cancer screening. / Every year if you are aged 72-80 years and have a 30-pack-year history of smoking and currently smoke or have quit within the past 15 years. Yearly screening is stopped once you have quit smoking for at least 15 years or develop a health problem that would prevent you from having lung cancer treatment.  Clinical breast exam.** / Every year after age 19 years.  BRCA-related cancer risk assessment.** / For women who have family members with a BRCA-related cancer (breast, ovarian, tubal, or peritoneal cancers).  Mammogram.** / Every year beginning at age 40 years and continuing for as long as you are in good health. Consult with your health care provider.  Pap test.** / Every 3 years starting at age 62 years through age 40 or 20 years with 3 consecutive normal Pap tests. Testing can be stopped between 65 and 70 years with 3 consecutive normal Pap tests and no abnormal Pap or HPV tests in the past 10 years.  HPV screening.** / Every 3 years from ages 48 years through ages 33 or 67 years with a history of 3 consecutive normal Pap tests. Testing can be stopped between 65 and 70 years with 3 consecutive normal Pap tests and no abnormal Pap or HPV tests in the past 10 years.  Fecal occult blood test (FOBT) of stool. / Every year beginning at age 41 years and continuing until age 66 years. You may not need to do this test if you get a colonoscopy every 10 years.  Flexible  sigmoidoscopy or colonoscopy.** / Every 5 years for a flexible sigmoidoscopy or every 10 years for a colonoscopy beginning at age 19 years and continuing until age 67 years.  Hepatitis C blood test.** / For all people born from [birth year] through 1963 and any individual with known risks for hepatitis C.  Osteoporosis screening.** / A one-time screening for women ages 71 years and over and women at risk for fractures or osteoporosis.  Skin self-exam. / Monthly.  Influenza vaccine. / Every year.  Tetanus, diphtheria, and acellular pertussis (Tdap/Td) vaccine.** / 1 dose of Td every 10 years.  Varicella vaccine.** / Consult your health care provider.  Zoster vaccine.** / 1 dose for adults aged 63 years or older.  Pneumococcal 13-valent conjugate (PCV13) vaccine.** / Consult your health care provider.  Pneumococcal polysaccharide (PPSV23) vaccine.** / 1 dose for all adults aged  55 years and older.  Meningococcal vaccine.** / Consult your health care provider.  Hepatitis A vaccine.** / Consult your health care provider.  Hepatitis B vaccine.** / Consult your health care provider.  Haemophilus influenzae type b (Hib) vaccine.** / Consult your health care provider. ** Family history and personal history of risk and conditions may change your health care provider's recommendations. Document Released: 09/21/2001 Document Revised: 12/10/2013 Document Reviewed: 12/21/2010 [facility] Patient Information 2015 [place], [place]. This information is not intended to replace advice given to you by your health care provider. Make sure you discuss any questions you have with your health care provider.

## 2014-06-10 NOTE — Assessment & Plan Note (Signed)
On Crestor 3 times a week, check FLP

## 2014-06-10 NOTE — Assessment & Plan Note (Signed)
Bone density test 04-2012 show a T score of -2.1, currently on Fosamax holiday. Plan: DEXA

## 2014-06-10 NOTE — Progress Notes (Signed)
Pre visit review using our clinic review tool, if applicable. No additional management support is needed unless otherwise documented below in the visit note. 

## 2014-06-10 NOTE — Assessment & Plan Note (Addendum)
Good compliance of medication, check a TSH, refill meds  as needed

## 2014-06-10 NOTE — Progress Notes (Signed)
Subjective:    Patient ID: Ariel Medina, female    DOB: 09/14/1927, 80 y.o.   MRN: [id number]  DOS:  06/10/2014 Type of visit - description :   Here for Medicare AWV: 1. Risk factors based on Past M, S, F history: yes   2. Physical Activities: active at home,independent on ADL, still drives   3. Depression/mood: neg screening   4. Hearing:mild decrease R hearing , declined referral, no problems noted w/ normal conversation 5. ADL's: independent on all her ADLs 6. Fall Risk: no recent falls, prevention discussed     7. Home Safety: feels safe at home    8. Height, weight, &visual acuity:see VS, no problems with vision, sees eye doctor regulalrly 9. Counseling: provided , see below 10. Labs ordered based on risk factors: yes   11. Referral Coordination: if needed   12. Care Plan--- see A/P   13. Cognitive Assessment :  memory, alertness wnl; motor skill seems appropriate for age, gait-- small steps , slt unsteady   14. Team care updated   In addition, we discussed the following Vitamin D deficiency, she took ergocalciferol but is not taking daily vitamin D. High cholesterol, good compliance with Crestor 3 times a week. Hypothyroidism, good compliance with meds.    ROS No  CP, SOB LE  Edema @ baseline  Denies  nausea, vomiting diarrhea, blood in the stools (-) cough, sputum production (-) wheezing, chest congestion  No dysuria, gross hematuria, difficulty urinating   No vaginal discharge, bleed     Past Medical History  Diagnosis Date  . Hyperlipidemia   . Hypertension   . Hypothyroidism   . Osteoarthritis   . Hiatal hernia   . Breast CA     left breast-invasive ductal ca stage 1-stopped arimidex 2008  . Diverticulitis of colon   . Syncope 2005    CT head (-), ECHO essent. neg, stress test (-), carotid u/s (-)  . Osteopenia     DEXA 7/02    Past Surgical History  Procedure Laterality Date  . Tonsillectomy and adenoidectomy    . Breast lumpectomy  04/2002   left    History   Social History  . Marital Status: Widowed    Spouse Name: N/A    Number of Children: 2  . Years of Education: N/A   Occupational History  . n/a    Social History Main Topics  . Smoking status: Never Smoker   . Smokeless tobacco: Never Used  . Alcohol Use: No  . Drug Use: No  . Sexual Activity: Not on file   Other Topics Concern  . Not on file   Social History Narrative   Lost a son, her other son lives w/ her on-off [name] , one of my patients)   Widow since 34           Family History  Problem Relation Age of Onset  . Heart attack Brother 30  . Diabetes Mother   . Stroke Father 72  . Stroke Sister 88  . Colon cancer Neg Hx   . Breast cancer Neg Hx       Medication List       This list is accurate as of: 06/10/14  6:38 PM.  Always use your most recent med list.               acetaminophen 500 MG tablet  Commonly known as:  TYLENOL  Take 500 mg by mouth every 6 (  six) hours as needed.     aspirin 81 MG tablet  Take 81 mg by mouth daily.     CALTRATE 600 PO  Take by mouth.     hydrochlorothiazide 25 MG tablet  Commonly known as:  HYDRODIURIL  TAKE 1 TABLET EVERY DAY     levothyroxine 75 MCG tablet  Commonly known as:  SYNTHROID, LEVOTHROID  TAKE 1 TABLET (75 MCG TOTAL) BY MOUTH DAILY.     rosuvastatin 10 MG tablet  Commonly known as:  CRESTOR  TAKE AS DIRECTED     VITAMIN D (CHOLECALCIFEROL) PO  Take by mouth.           Objective:   Physical Exam BP 141/80 mmHg  Pulse 75  Temp(Src) 98.1 F (36.7 C) (Oral)  Ht 5\' 3"  (1.6 m)  Wt 144 lb (65.318 kg)  BMI 25.51 kg/m2  SpO2 95%  General -- alert, well-developed, NAD.  Neck --no thyromegaly   HEENT-- Not pale.   Breast-- B exam wnl, no dominant mass Lungs -- normal respiratory effort, no intercostal retractions, no accessory muscle use, and normal breath sounds.  Heart-- normal rate, regular rhythm, no murmur.  Abdomen-- Not distended, good bowel  sounds,soft, non-tender. Extremities-- no pretibial edema bilaterally  Neurologic--  alert & oriented X3. Speech normal, gait appropriate for age, strength symmetric and appropriate for age.  Psych-- Cognition and judgment appear intact. Cooperative with normal attention span and concentration. No anxious or depressed appearing.       Assessment & Plan:

## 2014-06-10 NOTE — Assessment & Plan Note (Addendum)
Due for a MMG--unable to enter the order, will ask my nurse to do it Breast exam today wnl

## 2014-06-10 NOTE — Assessment & Plan Note (Addendum)
Td 2008 , pneumonia shot 02-2008 , shingles shot --2012 prevnar -- today flu shot today  MMG -- see breast ca PAP 2001 (-) , see previous entry, no further PAPs  colon ca screening see previous entry--->  No further screening planned  Diet and exercise discussed

## 2014-06-11 NOTE — Addendum Note (Signed)
Addended by: Reino Bellis on: 06/11/2014 08:47 AM   Modules accepted: Orders

## 2014-06-13 ENCOUNTER — Telehealth: Payer: Self-pay | Admitting: Internal Medicine

## 2014-06-13 MED ORDER — ROSUVASTATIN CALCIUM 10 MG PO TABS: ORAL_TABLET | ORAL | Status: DC

## 2014-06-13 NOTE — Telephone Encounter (Signed)
Caller name: Johnye, Kist Relation to IR:CVEL  Call back number: [phone]   Reason for call:   Pt inquiring about lab results

## 2014-06-13 NOTE — Telephone Encounter (Signed)
Spoke with Pt, please see lab notes.

## 2014-06-13 NOTE — Telephone Encounter (Signed)
LMOM for Pt to return call.  

## 2014-06-13 NOTE — Addendum Note (Signed)
Addended by: Wilfrid Lund on: 06/13/2014 02:48 PM   Modules accepted: Orders

## 2014-08-15 ENCOUNTER — Encounter: Payer: Self-pay | Admitting: Podiatrist

## 2014-08-15 ENCOUNTER — Ambulatory Visit (INDEPENDENT_AMBULATORY_CARE_PROVIDER_SITE_OTHER): Payer: Medicare Other | Admitting: Podiatrist

## 2014-08-15 DIAGNOSIS — B351 Tinea unguium: Secondary | ICD-10-CM

## 2014-08-15 DIAGNOSIS — M79676 Pain in unspecified toe(s): Secondary | ICD-10-CM | POA: Diagnosis not present

## 2014-08-16 NOTE — Progress Notes (Signed)
HPI: Patient presents today for follow up of foot and nail care. Denies any new complaints today.  Objective: Patients chart is reviewed. Vascular status reveals pedal pulses noted at 1/4 dp and pt bilateral . variscosities present bilateral Neurological sensation is Normal to [employer] monofilament bilateral. Patients nails are thickened, discolored, distrophic, friable and brittle with yellow-Bolen discoloration. Patient subjectively relates they are painful with shoes and with ambulation of bilateral feet. She has painful hyperkeratotic lesions present submetatarsal 1 and 5 bilateral which are also symptomatic and painful and cause difficulty with ambulation - fat pad atrophy is present bilateral  Assessment: Symptomatic onychomycosis  Plan: Discussed treatment options and alternatives. The symptomatic toenails were debrided through manual an mechanical means without complication. Return appointment recommended at routine intervals of 3 months

## 2014-10-12 ENCOUNTER — Other Ambulatory Visit: Payer: Self-pay | Admitting: Internal Medicine

## 2014-10-17 ENCOUNTER — Other Ambulatory Visit: Payer: Self-pay | Admitting: Internal Medicine

## 2014-10-24 ENCOUNTER — Ambulatory Visit (INDEPENDENT_AMBULATORY_CARE_PROVIDER_SITE_OTHER): Payer: Medicare Other | Admitting: Medical

## 2014-10-24 ENCOUNTER — Encounter: Payer: Self-pay | Admitting: Medical

## 2014-10-24 VITALS — BP 121/71 | HR 79 | Temp 97.8°F | Ht 63.0 in | Wt 135.8 lb

## 2014-10-24 DIAGNOSIS — J209 Acute bronchitis, unspecified: Secondary | ICD-10-CM

## 2014-10-24 DIAGNOSIS — J029 Acute pharyngitis, unspecified: Secondary | ICD-10-CM | POA: Diagnosis not present

## 2014-10-24 DIAGNOSIS — J40 Bronchitis, not specified as acute or chronic: Secondary | ICD-10-CM | POA: Insufficient documentation

## 2014-10-24 DIAGNOSIS — J309 Allergic rhinitis, unspecified: Secondary | ICD-10-CM | POA: Diagnosis not present

## 2014-10-24 MED ORDER — FLUTICASONE PROPIONATE 50 MCG/ACT NA SUSP: 2.0000 | Freq: Every day | NASAL | Status: DC

## 2014-10-24 MED ORDER — BENZONATATE 100 MG PO CAPS: 100.0000 mg | ORAL_CAPSULE | Freq: Two times a day (BID) | ORAL | Status: DC | PRN

## 2014-10-24 MED ORDER — AZITHROMYCIN 250 MG PO TABS: ORAL_TABLET | ORAL | Status: DC

## 2014-10-24 MED ORDER — LORATADINE 10 MG PO TABS: 10.0000 mg | ORAL_TABLET | Freq: Every day | ORAL | Status: DC

## 2014-10-24 NOTE — Progress Notes (Signed)
Pre visit review using our clinic review tool, if applicable. No additional management support is needed unless otherwise documented below in the visit note. 

## 2014-10-24 NOTE — Assessment & Plan Note (Signed)
Rapid negative. Throat pain may be from pnd.

## 2014-10-24 NOTE — Patient Instructions (Signed)
Allergic rhinitis Some for one week. Rx claritin and flonase .   Acute pharyngitis Rapid negative. Throat pain may be from pnd.   Acute bronchitis Following one week of allergy symptoms. Rx benzonatate for cough and azithromycin antibiotic.    Follow up in 7 days or as needed  If bronchitis signs/symptoms persist then recommend cxr.

## 2014-10-24 NOTE — Assessment & Plan Note (Signed)
Following one week of allergy symptoms. Rx benzonatate for cough and azithromycin antibiotic.

## 2014-10-24 NOTE — Assessment & Plan Note (Signed)
Some for one week. Rx claritin and flonase .

## 2014-10-24 NOTE — Progress Notes (Signed)
Subjective:    Patient ID: Ariel Medina, female    DOB: 1927/10/27, 81 y.o.   MRN: [id number]  HPI   Pt in with cough for about a week. Nasal congestion, runny nose and sneezing. Some eye itching. St for 2 days. Point to right side of neck as mild tender area.(Over rt sub mandiblar region).  Pt thinks maybe allergies this time of the year.  Pt has some productive cough.    Review of Systems  Constitutional: Negative for fever, chills and fatigue.  HENT: Positive for congestion, rhinorrhea, sneezing and sore throat.   Respiratory: Positive for cough. Negative for chest tightness, shortness of breath and wheezing.   Cardiovascular: Negative for chest pain and palpitations.  Gastrointestinal: Negative for abdominal pain.  Musculoskeletal: Negative for back pain.  Neurological: Negative for dizziness, weakness, light-headedness and headaches.  Hematological: Negative for adenopathy. Does not bruise/bleed easily.  Psychiatric/Behavioral: Negative for behavioral problems and confusion.   Past Medical History  Diagnosis Date  . Hyperlipidemia   . Hypertension   . Hypothyroidism   . Osteoarthritis   . Hiatal hernia   . Breast CA     left breast-invasive ductal ca stage 1-stopped arimidex 2008  . Diverticulitis of colon   . Syncope 2005    CT head (-), ECHO essent. neg, stress test (-), carotid u/s (-)  . Osteopenia     DEXA 7/02    History   Social History  . Marital Status: Widowed    Spouse Name: N/A  . Number of Children: 2  . Years of Education: N/A   Occupational History  . n/a    Social History Main Topics  . Smoking status: Never Smoker   . Smokeless tobacco: Never Used  . Alcohol Use: No  . Drug Use: No  . Sexual Activity: Not on file   Other Topics Concern  . Not on file   Social History Narrative   Lost a son, her other son lives w/ her on-off [name] , one of my patients)   Widow since 1993          Past Surgical History  Procedure  Laterality Date  . Tonsillectomy and adenoidectomy    . Breast lumpectomy  04/2002    left    Family History  Problem Relation Age of Onset  . Heart attack Brother 94  . Diabetes Mother   . Stroke Father 45  . Stroke Sister 70  . Colon cancer Neg Hx   . Breast cancer Neg Hx     Allergies  Allergen Reactions  . Ibuprofen     REACTION: bleed    Current Outpatient Prescriptions on File Prior to Visit  Medication Sig Dispense Refill  . acetaminophen (TYLENOL) 500 MG tablet Take 500 mg by mouth every 6 (six) hours as needed.      Marland Kitchen aspirin 81 MG tablet Take 81 mg by mouth daily.      . Calcium Carbonate (CALTRATE 600 PO) Take by mouth.      . hydrochlorothiazide (HYDRODIURIL) 25 MG tablet TAKE 1 TABLET EVERY DAY 90 tablet 2  . levothyroxine (SYNTHROID, LEVOTHROID) 75 MCG tablet TAKE 1 TABLET BY MOUTH EVERY DAY 90 tablet 1  . rosuvastatin (CRESTOR) 10 MG tablet Take 1 tablet daily. 30 tablet 3  . VITAMIN D, CHOLECALCIFEROL, PO Take by mouth.       No current facility-administered medications on file prior to visit.    BP 121/71 mmHg  Pulse 79  Temp(Src) 97.8 F (36.6 C) (Oral)  Ht 5\' 3"  (1.6 m)  Wt 135 lb 12.8 oz (61.598 kg)  BMI 24.06 kg/m2  SpO2 95%      Objective:   Physical Exam  General  Mental Status - Alert. General Appearance - Well groomed. Not in acute distress.  Skin Rashes- No Rashes.  HEENT Head- Normal. Ear Auditory Canal - Left- Normal. Right - Normal.Tympanic Membrane- Left- Normal. Right- Normal. Eye Sclera/Conjunctiva- Left- Normal. Right- Normal. Nose & Sinuses Nasal Mucosa- Left-  boggy + Congested. Right-   boggy + Congested. No sinus pressure. Mouth & Throat Lips: Upper Lip- Normal: no dryness, cracking, pallor, cyanosis, or vesicular eruption. Lower Lip-Normal: no dryness, cracking, pallor, cyanosis or vesicular eruption. Buccal Mucosa- Bilateral- No Aphthous ulcers. Oropharynx- No Discharge or Erythema. Tonsils: Characteristics-  Bilateral- faint  Erythema. Size/Enlargement- Bilateral- No enlargement. Discharge- bilateral-None.  Neck Neck- Supple. No Masses.Mild rt submandibular tenderness.   Chest and Lung Exam Auscultation: Breath Sounds:- even and unlabored, but bilateral upper lobe rhonchi.  Cardiovascular Auscultation:Rythm- Regular, rate and rhythm. Murmurs & Other Heart Sounds:Ausculatation of the heart reveal- No Murmurs.  Lymphatic Head & Neck General Head & Neck Lymphatics: Bilateral: Description- No Localized lymphadenopathy.      Assessment & Plan:

## 2014-11-14 ENCOUNTER — Ambulatory Visit (INDEPENDENT_AMBULATORY_CARE_PROVIDER_SITE_OTHER): Payer: Medicare Other | Admitting: Podiatrist

## 2014-11-14 ENCOUNTER — Encounter: Payer: Self-pay | Admitting: Podiatrist

## 2014-11-14 DIAGNOSIS — B351 Tinea unguium: Secondary | ICD-10-CM | POA: Diagnosis not present

## 2014-11-14 DIAGNOSIS — M79609 Pain in unspecified limb: Principal | ICD-10-CM

## 2014-11-14 NOTE — Progress Notes (Signed)
HPI: Patient presents today for follow up of foot and nail care. Denies any new complaints today.  Objective: Patients chart is reviewed. Vascular status reveals pedal pulses noted at 1/4 dp and pt bilateral . variscosities present bilateral Neurological sensation is Normal to [employer] monofilament bilateral. Patients nails are thickened, discolored, distrophic, friable and brittle with yellow-[place] discoloration. Patient subjectively relates they are painful with shoes and with ambulation of bilateral feet. She has painful hyperkeratotic lesions present submetatarsal 1 and 5 bilateral which are also symptomatic and painful and cause difficulty with ambulation - fat pad atrophy is present bilateral  Assessment: Symptomatic onychomycosis  Plan: Discussed treatment options and alternatives. The symptomatic toenails were debrided through manual an mechanical means without complication. Return appointment recommended at routine intervals of 3 months

## 2014-11-27 ENCOUNTER — Encounter: Payer: Self-pay | Admitting: Internal Medicine

## 2014-12-26 ENCOUNTER — Encounter: Payer: Self-pay | Admitting: Medical

## 2014-12-26 ENCOUNTER — Telehealth: Payer: Self-pay | Admitting: Internal Medicine

## 2014-12-26 ENCOUNTER — Ambulatory Visit (INDEPENDENT_AMBULATORY_CARE_PROVIDER_SITE_OTHER): Payer: Medicare Other | Admitting: Medical

## 2014-12-26 ENCOUNTER — Ambulatory Visit: Payer: Medicare Other | Admitting: Medical

## 2014-12-26 VITALS — BP 154/87 | HR 73 | Temp 97.7°F | Ht 63.0 in | Wt 136.4 lb

## 2014-12-26 DIAGNOSIS — M5489 Other dorsalgia: Secondary | ICD-10-CM | POA: Diagnosis not present

## 2014-12-26 DIAGNOSIS — R82998 Other abnormal findings in urine: Secondary | ICD-10-CM

## 2014-12-26 DIAGNOSIS — N390 Urinary tract infection, site not specified: Secondary | ICD-10-CM | POA: Diagnosis not present

## 2014-12-26 LAB — POCT URINALYSIS DIPSTICK
Bilirubin, UA: 1
Glucose, UA: NEGATIVE
Ketones, UA: 5
NITRITE UA: NEGATIVE
PROTEIN UA: 100
RBC UA: NEGATIVE
Spec Grav, UA: >=1.03
UROBILINOGEN UA: 0.2
pH, UA: 5

## 2014-12-26 MED ORDER — CYCLOBENZAPRINE HCL 5 MG PO TABS: 5.0000 mg | ORAL_TABLET | Freq: Every day | ORAL | Status: DC

## 2014-12-26 NOTE — Telephone Encounter (Signed)
Pt will need to be seen by a provider for a muscle relaxer, she does not have one on her medication list.

## 2014-12-26 NOTE — Telephone Encounter (Signed)
Pt called back. Advised that she needs to be seen. Pt coming in 5:45pm today with Ariel Medina.

## 2014-12-26 NOTE — Telephone Encounter (Signed)
Relation to pt: self  Call back number: [phone] Pharmacy: [id number] [phone](Phone) [phone](Fax)         Reason for call:  Pt states she is extreme pain requesting a refill of musle relaxer (pt states she does not know the name of RX) pt states please call when RX is sent

## 2014-12-26 NOTE — Progress Notes (Signed)
Pre visit review using our clinic review tool, if applicable. No additional management support is needed unless otherwise documented below in the visit note. 

## 2014-12-26 NOTE — Patient Instructions (Addendum)
Your location and presentation supports muscular origin of pain. As well as your described spasms. Will rx muscle relaxant. If you pain worsens or changes location. Particularly andy abdomen pain then ED evaluation.  We will send urine out for culture since you had some trace infection fighting cells found on ua.  Follow up in 7 days or as needed.  Considered toradol im in office but she reported problems with ibuprofen.  Considered adding tramadol to today rx but concern for side effect profile in her age group. If she calls back in pain would consider giving or maybe low dose norco.

## 2014-12-26 NOTE — Progress Notes (Signed)
Subjective:    Patient ID: Ariel Medina, female    DOB: 12-29-1927, 81 y.o.   MRN: [id number]  HPI  Pt in states she has some rt side back pain. She points to paraspinal region. She states pain started this am. Last 2 days. She has been shredding papers in house. Shredding a lot personal documents and was bent over. Pt states pain in this area before and would respond to muscle relaxant that Dr. [name] gave her.   I did review note and pain is in similar location. No abd pain. No fever, no chills, and no sweats. No radicular pain. No rash/vesicle eruption to skin.    Review of Systems  Constitutional: Negative for fever, chills, diaphoresis, activity change and fatigue.  Respiratory: Negative for cough, chest tightness and shortness of breath.   Cardiovascular: Negative for chest pain, palpitations and leg swelling.  Gastrointestinal: Negative for nausea, vomiting, abdominal pain, blood in stool, abdominal distention and anal bleeding.  Musculoskeletal: Positive for back pain. Negative for neck pain and neck stiffness.  Neurological: Negative for dizziness, tremors, seizures, syncope, facial asymmetry, speech difficulty, weakness, light-headedness, numbness and headaches.  Psychiatric/Behavioral: Negative for behavioral problems, confusion and agitation. The patient is not nervous/anxious.     Past Medical History  Diagnosis Date  . Hyperlipidemia   . Hypertension   . Hypothyroidism   . Osteoarthritis   . Hiatal hernia   . Breast CA     left breast-invasive ductal ca stage 1-stopped arimidex 2008  . Diverticulitis of colon   . Syncope 2005    CT head (-), ECHO essent. neg, stress test (-), carotid u/s (-)  . Osteopenia     DEXA 7/02    History   Social History  . Marital Status: Widowed    Spouse Name: N/A  . Number of Children: 2  . Years of Education: N/A   Occupational History  . n/a    Social History Main Topics  . Smoking status: Never Smoker   . Smokeless  tobacco: Never Used  . Alcohol Use: No  . Drug Use: No  . Sexual Activity: Not on file   Other Topics Concern  . Not on file   Social History Narrative   Lost a son, her other son lives w/ her on-off [name] , one of my patients)   Widow since 1993          Past Surgical History  Procedure Laterality Date  . Tonsillectomy and adenoidectomy    . Breast lumpectomy  04/2002    left    Family History  Problem Relation Age of Onset  . Heart attack Brother 62  . Diabetes Mother   . Stroke Father 62  . Stroke Sister 20  . Colon cancer Neg Hx   . Breast cancer Neg Hx     Allergies  Allergen Reactions  . Ibuprofen     REACTION: bleed    Current Outpatient Prescriptions on File Prior to Visit  Medication Sig Dispense Refill  . acetaminophen (TYLENOL) 500 MG tablet Take 500 mg by mouth every 6 (six) hours as needed.      Marland Kitchen aspirin 81 MG tablet Take 81 mg by mouth daily.      . Calcium Carbonate (CALTRATE 600 PO) Take by mouth.      . fluticasone (FLONASE) 50 MCG/ACT nasal spray Place 2 sprays into both nostrils daily. 16 g 1  . hydrochlorothiazide (HYDRODIURIL) 25 MG tablet TAKE 1 TABLET EVERY  DAY 90 tablet 2  . levothyroxine (SYNTHROID, LEVOTHROID) 75 MCG tablet TAKE 1 TABLET BY MOUTH EVERY DAY 90 tablet 1  . loratadine (CLARITIN) 10 MG tablet Take 1 tablet (10 mg total) by mouth daily. 30 tablet 0  . rosuvastatin (CRESTOR) 10 MG tablet Take 1 tablet daily. 30 tablet 3  . VITAMIN D, CHOLECALCIFEROL, PO Take by mouth.      . benzonatate (TESSALON) 100 MG capsule Take 1 capsule (100 mg total) by mouth 2 (two) times daily as needed for cough. (Patient not taking: Reported on 12/26/2014) 20 capsule 0   No current facility-administered medications on file prior to visit.    BP 154/87 mmHg  Pulse 73  Temp(Src) 97.7 F (36.5 C) (Oral)  Ht '5\' 3"'$  (1.6 m)  Wt 136 lb 6.4 oz (61.871 kg)  BMI 24.17 kg/m2  SpO2 95%       Objective:   Physical Exam  General- No acute  distress. Pleasant patient. Neck- Full range of motion, no jvd Lungs- Clear, even and unlabored. Heart- regular rate and rhythm. Neurologic- CNII- XII grossly intact.   Abdomen Inspection:-Inspection Normal.  Palpation/Perucssion: Palpation and Percussion of the abdomen reveal- Non Tender, No Rebound tenderness, No rigidity(Guarding) and No Palpable abdominal masses.  Liver:-Normal.  Spleen:- Normal.   Back- no mid spine tenderness. Rt parathoracic and paralumbar junction pain. No cva tenderness. Pain on movement.  Skin- no vesicles seen on back.        Assessment & Plan:  Paraspinal muscle strain-Your location and presentation supports muscular origin of pain. As well as your described spasms. Will rx muscle relaxant. If you pain worsens or changes location. Particularly andy abdomen pain then ED evaluation.

## 2014-12-28 LAB — URINE CULTURE: Colony Count: 15000

## 2015-01-20 ENCOUNTER — Other Ambulatory Visit: Payer: Self-pay

## 2015-01-20 MED ORDER — HYDROCHLOROTHIAZIDE 25 MG PO TABS: 25.0000 mg | ORAL_TABLET | Freq: Every day | ORAL | Status: DC

## 2015-01-21 ENCOUNTER — Telehealth: Payer: Self-pay | Admitting: Internal Medicine

## 2015-01-21 MED ORDER — LEVOTHYROXINE SODIUM 75 MCG PO TABS: 75.0000 ug | ORAL_TABLET | Freq: Every day | ORAL | Status: DC

## 2015-01-21 NOTE — Telephone Encounter (Signed)
Caller name: Gildardo Cranker  Relation to pt: [place]  Call back number: [phone]   Reason for call:  Pt is transferring all her medications from [facility] to [facility] and the manufacture is different for the levothyroxine. Pharmacy would like to know if this is ok

## 2015-01-21 NOTE — Telephone Encounter (Signed)
Pt is out of meds/in need of refill asap

## 2015-01-21 NOTE — Telephone Encounter (Signed)
Levothyroxine sent to [facility] for 30 day supply only. Please inform Pt that she is overdue for her 6 month F/U. She will need to be seen before her next refill.

## 2015-01-21 NOTE — Telephone Encounter (Signed)
Please scheduled follow up for 02/21/2015

## 2015-01-21 NOTE — Addendum Note (Signed)
Addended by: Wilfrid Lund on: 01/21/2015 02:10 PM   Modules accepted: Orders

## 2015-01-21 NOTE — Telephone Encounter (Signed)
That is fine 

## 2015-02-13 ENCOUNTER — Other Ambulatory Visit: Payer: Self-pay

## 2015-02-19 ENCOUNTER — Other Ambulatory Visit: Payer: Self-pay | Admitting: Internal Medicine

## 2015-02-20 ENCOUNTER — Encounter: Payer: Self-pay | Admitting: Podiatry

## 2015-02-20 ENCOUNTER — Ambulatory Visit (INDEPENDENT_AMBULATORY_CARE_PROVIDER_SITE_OTHER): Payer: Medicare Other | Admitting: Podiatry

## 2015-02-20 VITALS — BP 123/83 | HR 73 | Resp 18

## 2015-02-20 DIAGNOSIS — B351 Tinea unguium: Secondary | ICD-10-CM | POA: Diagnosis not present

## 2015-02-20 DIAGNOSIS — M79676 Pain in unspecified toe(s): Secondary | ICD-10-CM | POA: Diagnosis not present

## 2015-02-20 DIAGNOSIS — Q828 Other specified congenital malformations of skin: Secondary | ICD-10-CM

## 2015-02-20 NOTE — Progress Notes (Signed)
Patient ID: Ariel Medina, female   DOB: 1928-03-26, 81 y.o.   MRN: [id number] Complaint:  Visit Type: Patient returns to my office for continued preventative foot care services. Complaint: Patient states" my nails have grown long and thick and become painful to walk and wear shoes" Patient has been diagnosed with DM with no complications. He presents for preventative foot care services. No changes to ROS.  She has painful calluses on the bottom of both feet.  Podiatric Exam: Vascular: dorsalis pedis and posterior tibial pulses are palpable bilateral. Capillary return is immediate. Temperature gradient is WNL. Skin turgor WNL  Sensorium: Normal Semmes Weinstein monofilament test. Normal tactile sensation bilaterally. Nail Exam: Pt has thick disfigured discolored nails with subungual debris noted left foot entire nail hallux through fifth toenails and hallux right foot. Ulcer Exam: There is no evidence of ulcer or pre-ulcerative changes or infection. Orthopedic Exam: Muscle tone and strength are WNL. No limitations in general ROM. No crepitus or effusions noted. Foot type and digits show no abnormalities. Bony prominences are unremarkable. Skin:  Porokeratosis sub fifth mets B/L.Marland Kitchen No infection or ulcers  Diagnosis:  Tinea unguium, Pain in right toe, pain in left toes, Porokeratosis 5th B/L  Treatment & Plan Procedures and Treatment: Consent by patient was obtained for treatment procedures. The patient understood the discussion of treatment and procedures well. All questions were answered thoroughly reviewed. Debridement of mycotic and hypertrophic toenails, 1 through 5 bilateral and clearing of subungual debris. No ulceration, no infection noted. Debridement of porokeratosis Return Visit-Office Procedure: Patient instructed to return to the office for a follow up visit 3 months for continued evaluation and treatment.

## 2015-02-21 ENCOUNTER — Encounter: Payer: Self-pay | Admitting: Internal Medicine

## 2015-02-21 ENCOUNTER — Ambulatory Visit (INDEPENDENT_AMBULATORY_CARE_PROVIDER_SITE_OTHER): Payer: Medicare Other | Admitting: Internal Medicine

## 2015-02-21 VITALS — BP 122/78 | HR 73 | Temp 97.6°F | Ht 63.0 in | Wt 138.1 lb

## 2015-02-21 DIAGNOSIS — Z853 Personal history of malignant neoplasm of breast: Secondary | ICD-10-CM

## 2015-02-21 DIAGNOSIS — E785 Hyperlipidemia, unspecified: Secondary | ICD-10-CM

## 2015-02-21 DIAGNOSIS — Z1231 Encounter for screening mammogram for malignant neoplasm of breast: Secondary | ICD-10-CM

## 2015-02-21 DIAGNOSIS — I10 Essential (primary) hypertension: Secondary | ICD-10-CM | POA: Diagnosis not present

## 2015-02-21 DIAGNOSIS — E033 Postinfectious hypothyroidism: Secondary | ICD-10-CM

## 2015-02-21 DIAGNOSIS — Z780 Asymptomatic menopausal state: Secondary | ICD-10-CM

## 2015-02-21 MED ORDER — HYDROCHLOROTHIAZIDE 25 MG PO TABS: 25.0000 mg | ORAL_TABLET | Freq: Every day | ORAL | Status: DC

## 2015-02-21 MED ORDER — LEVOTHYROXINE SODIUM 75 MCG PO TABS: 75.0000 ug | ORAL_TABLET | Freq: Every day | ORAL | Status: DC

## 2015-02-21 MED ORDER — ROSUVASTATIN CALCIUM 10 MG PO TABS: 5.0000 mg | ORAL_TABLET | Freq: Every day | ORAL | Status: DC

## 2015-02-21 NOTE — Patient Instructions (Signed)
Take Crestor 10 mg half tablet every day  Will schedule your mammogram and bone density test

## 2015-02-21 NOTE — Progress Notes (Signed)
Subjective:    Patient ID: Ariel Medina, female    DOB: November 25, 1927, 81 y.o.   MRN: [id number]  DOS:  02/21/2015 Type of visit - description : rov Interval history: Hypothyroidism, good compliance of medication, needs a refill Hypertension, needs a refill on hydrochlorothiazide, BP today satisfactory. High cholesterol, based on the last cholesterol panel she was recommended to increase Crestor but she still taking 10 mg 3 times a week.   Review of Systems  Denies chest pain or difficulty breathing No nausea, vomiting, diarrhea Past Medical History  Diagnosis Date  . Hyperlipidemia   . Hypertension   . Hypothyroidism   . Osteoarthritis   . Hiatal hernia   . Breast CA     left breast-invasive ductal ca stage 1-stopped arimidex 2008  . Diverticulitis of colon   . Syncope 2005    CT head (-), ECHO essent. neg, stress test (-), carotid u/s (-)  . Osteopenia     DEXA 7/02    Past Surgical History  Procedure Laterality Date  . Tonsillectomy and adenoidectomy    . Breast lumpectomy  04/2002    left    History   Social History  . Marital Status: Widowed    Spouse Name: N/A  . Number of Children: 2  . Years of Education: N/A   Occupational History  . n/a    Social History Main Topics  . Smoking status: Never Smoker   . Smokeless tobacco: Never Used  . Alcohol Use: No  . Drug Use: No  . Sexual Activity: Not on file   Other Topics Concern  . Not on file   Social History Narrative   Lost a son, her other son lives w/ her on-off [name] , one of my patients)   Widow since 1993              Medication List       This list is accurate as of: 02/21/15 12:40 PM.  Always use your most recent med list.               acetaminophen 500 MG tablet  Commonly known as:  TYLENOL  Take 500 mg by mouth every 6 (six) hours as needed.     aspirin 81 MG tablet  Take 81 mg by mouth daily.     CALTRATE 600 PO  Take by mouth.     cyclobenzaprine 5 MG tablet    Commonly known as:  FLEXERIL  Take 1 tablet (5 mg total) by mouth at bedtime.     fluticasone 50 MCG/ACT nasal spray  Commonly known as:  FLONASE  Place 2 sprays into both nostrils daily.     hydrochlorothiazide 25 MG tablet  Commonly known as:  HYDRODIURIL  Take 1 tablet (25 mg total) by mouth daily.     levothyroxine 75 MCG tablet  Commonly known as:  SYNTHROID, LEVOTHROID  Take 1 tablet (75 mcg total) by mouth daily.     loratadine 10 MG tablet  Commonly known as:  CLARITIN  Take 1 tablet (10 mg total) by mouth daily.     rosuvastatin 10 MG tablet  Commonly known as:  CRESTOR  Take 0.5 tablets (5 mg total) by mouth daily.     VITAMIN D (CHOLECALCIFEROL) PO  Take by mouth.           Objective:   Physical Exam BP 122/78 mmHg  Pulse 73  Temp(Src) 97.6 F (36.4 C) (Oral)  Ht 5'  3" (1.6 m)  Wt 138 lb 2 oz (62.653 kg)  BMI 24.47 kg/m2  SpO2 97% General:   Well developed, well nourished . NAD.  HEENT:  Normocephalic . Face symmetric, atraumatic Lungs:  CTA B Normal respiratory effort, no intercostal retractions, no accessory muscle use. Heart: RRR,  no murmur.  No pretibial edema bilaterally  Skin: Not pale. Not jaundice Neurologic:  alert & oriented X3.  Speech normal, gait appropriate for age and unassisted Psych--  Cognition and judgment appear intact.  Cooperative with normal attention span and concentration.  Behavior appropriate. No anxious or depressed appearing.        Assessment & Plan:    Hypothyroidism: continue with current dose of Synthroid, refill medications  Hypertension: continue hydrochlorothiazide refill medications  High cholesterol: Was recommended to increase Crestor from 3 times a day to everyday, patient quite reluctant to do that, afraid it may affect her liver. Advise patient that her liver is checked regularly and is always normal. Eventually we agreed to increase Crestor to half tablet daily. Recheck on return to the  office  At the last visit the bone density test and mammogram were ordered but not done, will reschedule both at the same day to be done @ this building.

## 2015-02-21 NOTE — Progress Notes (Signed)
Pre visit review using our clinic review tool, if applicable. No additional management support is needed unless otherwise documented below in the visit note. 

## 2015-03-03 ENCOUNTER — Ambulatory Visit (HOSPITAL_BASED_OUTPATIENT_CLINIC_OR_DEPARTMENT_OTHER)
Admission: RE | Admit: 2015-03-03 | Discharge: 2015-03-03 | Disposition: A | Discharge status: Home / Self Care | Payer: Medicare Other | Source: Ambulatory Visit | Attending: Internal Medicine | Admitting: Internal Medicine

## 2015-03-03 DIAGNOSIS — Z1231 Encounter for screening mammogram for malignant neoplasm of breast: Secondary | ICD-10-CM | POA: Insufficient documentation

## 2015-03-03 DIAGNOSIS — M8589 Other specified disorders of bone density and structure, multiple sites: Secondary | ICD-10-CM | POA: Diagnosis not present

## 2015-03-03 DIAGNOSIS — Z780 Asymptomatic menopausal state: Secondary | ICD-10-CM | POA: Insufficient documentation

## 2015-05-13 ENCOUNTER — Ambulatory Visit (INDEPENDENT_AMBULATORY_CARE_PROVIDER_SITE_OTHER): Payer: Medicare Other | Admitting: Internal Medicine

## 2015-05-13 ENCOUNTER — Encounter: Payer: Self-pay | Admitting: Internal Medicine

## 2015-05-13 VITALS — BP 126/74 | HR 76 | Temp 97.6°F | Ht 63.0 in | Wt 144.5 lb

## 2015-05-13 DIAGNOSIS — Z23 Encounter for immunization: Secondary | ICD-10-CM | POA: Diagnosis not present

## 2015-05-13 DIAGNOSIS — D229 Melanocytic nevi, unspecified: Secondary | ICD-10-CM | POA: Diagnosis not present

## 2015-05-13 DIAGNOSIS — Z09 Encounter for follow-up examination after completed treatment for conditions other than malignant neoplasm: Secondary | ICD-10-CM

## 2015-05-13 NOTE — Progress Notes (Signed)
Pre visit review using our clinic review tool, if applicable. No additional management support is needed unless otherwise documented below in the visit note. 

## 2015-05-13 NOTE — Patient Instructions (Signed)
Will refer you to Dermatology

## 2015-05-13 NOTE — Progress Notes (Signed)
Subjective:    Patient ID: Ariel Medina, female    DOB: 09/01/1927, 81 y.o.   MRN: [id number]  DOS:  05/13/2015 Type of visit - description : Acute Interval history: Has a mole at the left arm, she hit it with  a book unintentionally and the area started bleeding and now is hurting. This happened a few days ago.    Review of Systems   Past Medical History  Diagnosis Date  . Hyperlipidemia   . Hypertension   . Hypothyroidism   . Osteoarthritis   . Hiatal hernia   . Breast CA ([place])     left breast-invasive ductal ca stage 1-stopped arimidex 2008  . Diverticulitis of colon   . Syncope 2005    CT head (-), ECHO essent. neg, stress test (-), carotid u/s (-)  . Osteopenia     DEXA 7/02    Past Surgical History  Procedure Laterality Date  . Tonsillectomy and adenoidectomy    . Breast lumpectomy  04/2002    left    Social History   Social History  . Marital Status: Widowed    Spouse Name: N/A  . Number of Children: 2  . Years of Education: N/A   Occupational History  . n/a    Social History Main Topics  . Smoking status: Never Smoker   . Smokeless tobacco: Never Used  . Alcohol Use: No  . Drug Use: No  . Sexual Activity: Not on file   Other Topics Concern  . Not on file   Social History Narrative   Lost a son, her other son lives w/ her on-off [name] , one of my patients)   Widow since 1993              Medication List       This list is accurate as of: 05/13/15  9:23 PM.  Always use your most recent med list.               acetaminophen 500 MG tablet  Commonly known as:  TYLENOL  Take 500 mg by mouth every 6 (six) hours as needed.     aspirin 81 MG tablet  Take 81 mg by mouth daily.     CALTRATE 600 PO  Take by mouth.     cyclobenzaprine 5 MG tablet  Commonly known as:  FLEXERIL  Take 1 tablet (5 mg total) by mouth at bedtime.     fluticasone 50 MCG/ACT nasal spray  Commonly known as:  FLONASE  Place 2 sprays into both  nostrils daily.     hydrochlorothiazide 25 MG tablet  Commonly known as:  HYDRODIURIL  Take 1 tablet (25 mg total) by mouth daily.     levothyroxine 75 MCG tablet  Commonly known as:  SYNTHROID, LEVOTHROID  Take 1 tablet (75 mcg total) by mouth daily.     loratadine 10 MG tablet  Commonly known as:  CLARITIN  Take 1 tablet (10 mg total) by mouth daily.     rosuvastatin 10 MG tablet  Commonly known as:  CRESTOR  Take 0.5 tablets (5 mg total) by mouth daily.     VITAMIN D (CHOLECALCIFEROL) PO  Take by mouth.           Objective:   Physical Exam  Constitutional: She appears well-developed and well-nourished. No distress.  Skin: She is not diaphoretic.     Psychiatric: She has a normal mood and affect. Her behavior is normal. Judgment and  thought content normal.   BP 126/74 mmHg  Pulse 76  Temp(Src) 97.6 F (36.4 C) (Oral)  Ht '5\' 3"'$  (1.6 m)  Wt 144 lb 8 oz (65.545 kg)  BMI 25.60 kg/m2  SpO2 98%     Assessment & Plan:   Assessment > HTN Hyperlipidemia Hypothyroidism Osteopenia DJD GERD  H/o diverticulitis Breast cancer, L breast,s/p  Remix (dc 2008) Syncope 2005  Plan  NEVI: Reports bleeding from all nevi with minimal trauma. Refer to dermatology Primary care: Flu shot today

## 2015-05-13 NOTE — Assessment & Plan Note (Signed)
NEVI: Reports bleeding from all nevi with minimal trauma. Refer to dermatology Primary care: Flu shot today

## 2015-05-29 ENCOUNTER — Encounter: Payer: Self-pay | Admitting: Podiatry

## 2015-05-29 ENCOUNTER — Ambulatory Visit (INDEPENDENT_AMBULATORY_CARE_PROVIDER_SITE_OTHER): Payer: Medicare Other | Admitting: Podiatry

## 2015-05-29 DIAGNOSIS — M79676 Pain in unspecified toe(s): Secondary | ICD-10-CM | POA: Diagnosis not present

## 2015-05-29 DIAGNOSIS — B351 Tinea unguium: Secondary | ICD-10-CM

## 2015-05-29 DIAGNOSIS — Q828 Other specified congenital malformations of skin: Secondary | ICD-10-CM | POA: Diagnosis not present

## 2015-05-29 NOTE — Progress Notes (Signed)
Patient ID: Ariel Medina, female   DOB: 11/10/1927, 81 y.o.   MRN: [id number] Complaint:  Visit Type: Patient returns to my office for continued preventative foot care services. Complaint: Patient states" my nails have grown long and thick and become painful to walk and wear shoes" Patient has been diagnosed with DM with no complications. He presents for preventative foot care services. No changes to ROS.  She has painful calluses on the bottom of both feet.  Podiatric Exam: Vascular: dorsalis pedis and posterior tibial pulses are palpable bilateral. Capillary return is immediate. Temperature gradient is WNL. Skin turgor WNL  Sensorium: Normal Semmes Weinstein monofilament test. Normal tactile sensation bilaterally. Nail Exam: Pt has thick disfigured discolored nails with subungual debris noted left foot entire nail hallux through fifth toenails and hallux right foot. Ulcer Exam: There is no evidence of ulcer or pre-ulcerative changes or infection. Orthopedic Exam: Muscle tone and strength are WNL. No limitations in general ROM. No crepitus or effusions noted. Foot type and digits show no abnormalities. Bony prominences are unremarkable. Skin:  Porokeratosis sub fifth mets B/L.Marland Kitchen No infection or ulcers  Diagnosis:  Tinea unguium, Pain in right toe, pain in left toes, Porokeratosis 5th B/L  Treatment & Plan Procedures and Treatment: Consent by patient was obtained for treatment procedures. The patient understood the discussion of treatment and procedures well. All questions were answered thoroughly reviewed. Debridement of mycotic and hypertrophic toenails, 1 through 5 bilateral and clearing of subungual debris. No ulceration, no infection noted. Debridement of porokeratosis Return Visit-Office Procedure: Patient instructed to return to the office for a follow up visit 3 months for continued evaluation and treatment.

## 2015-07-17 ENCOUNTER — Encounter: Payer: Self-pay | Admitting: Behavioral Health

## 2015-07-17 ENCOUNTER — Telehealth: Payer: Self-pay | Admitting: Behavioral Health

## 2015-07-17 NOTE — Telephone Encounter (Signed)
Pre-Visit Call completed with patient and chart updated.   Pre-Visit Info documented in Specialty Comments under [employer].    

## 2015-07-18 ENCOUNTER — Encounter: Payer: Self-pay | Admitting: Internal Medicine

## 2015-07-18 ENCOUNTER — Ambulatory Visit (INDEPENDENT_AMBULATORY_CARE_PROVIDER_SITE_OTHER): Payer: Medicare Other | Admitting: Internal Medicine

## 2015-07-18 VITALS — BP 118/66 | HR 73 | Temp 97.5°F | Ht 63.0 in | Wt 142.1 lb

## 2015-07-18 DIAGNOSIS — E039 Hypothyroidism, unspecified: Secondary | ICD-10-CM | POA: Diagnosis not present

## 2015-07-18 DIAGNOSIS — I10 Essential (primary) hypertension: Secondary | ICD-10-CM | POA: Diagnosis not present

## 2015-07-18 DIAGNOSIS — Z0000 Encounter for general adult medical examination without abnormal findings: Secondary | ICD-10-CM | POA: Diagnosis not present

## 2015-07-18 DIAGNOSIS — Z09 Encounter for follow-up examination after completed treatment for conditions other than malignant neoplasm: Secondary | ICD-10-CM

## 2015-07-18 DIAGNOSIS — E785 Hyperlipidemia, unspecified: Secondary | ICD-10-CM

## 2015-07-18 DIAGNOSIS — M8580 Other specified disorders of bone density and structure, unspecified site: Secondary | ICD-10-CM | POA: Diagnosis not present

## 2015-07-18 DIAGNOSIS — E876 Hypokalemia: Secondary | ICD-10-CM

## 2015-07-18 LAB — CBC WITH DIFFERENTIAL/PLATELET
BASOS PCT: 0.6 % (ref 0.0–3.0)
Basophils Absolute: 0 10*3/uL (ref 0.0–0.1)
EOS PCT: 1.9 % (ref 0.0–5.0)
Eosinophils Absolute: 0.1 10*3/uL (ref 0.0–0.7)
HEMATOCRIT: 44.8 % (ref 36.0–46.0)
HEMOGLOBIN: 15.1 g/dL — AB (ref 12.0–15.0)
LYMPHS PCT: 22.9 % (ref 12.0–46.0)
Lymphs Abs: 1.2 10*3/uL (ref 0.7–4.0)
MCHC: 33.6 g/dL (ref 30.0–36.0)
MCV: 94.3 fl (ref 78.0–100.0)
MONOS PCT: 7.8 % (ref 3.0–12.0)
Monocytes Absolute: 0.4 10*3/uL (ref 0.1–1.0)
NEUTROS ABS: 3.4 10*3/uL (ref 1.4–7.7)
Neutrophils Relative %: 66.8 % (ref 43.0–77.0)
PLATELETS: 239 10*3/uL (ref 150.0–400.0)
RBC: 4.76 Mil/uL (ref 3.87–5.11)
RDW: 13.9 % (ref 11.5–15.5)
WBC: 5.2 10*3/uL (ref 4.0–10.5)

## 2015-07-18 LAB — LIPID PANEL
Cholesterol: 171 mg/dL (ref 0–200)
HDL: 58.3 mg/dL (ref >39.00)
LDL Cholesterol: 96 mg/dL (ref 0–99)
NonHDL: 112.96
TRIGLYCERIDES: 86 mg/dL (ref 0.0–149.0)
Total CHOL/HDL Ratio: 3
VLDL: 17.2 mg/dL (ref 0.0–40.0)

## 2015-07-18 LAB — COMPREHENSIVE METABOLIC PANEL
ALBUMIN: 3.9 g/dL (ref 3.5–5.2)
ALK PHOS: 42 U/L (ref 39–117)
ALT: 11 U/L (ref 0–35)
AST: 20 U/L (ref 0–37)
BUN: 12 mg/dL (ref 6–23)
CALCIUM: 9.8 mg/dL (ref 8.4–10.5)
CHLORIDE: 100 meq/L (ref 96–112)
CO2: 34 mEq/L — ABNORMAL HIGH (ref 19–32)
CREATININE: 0.72 mg/dL (ref 0.40–1.20)
GFR: 81.77 mL/min (ref >60.00)
Glucose, Bld: 86 mg/dL (ref 70–99)
Potassium: 3.3 mEq/L — ABNORMAL LOW (ref 3.5–5.1)
SODIUM: 141 meq/L (ref 135–145)
TOTAL PROTEIN: 6.4 g/dL (ref 6.0–8.3)
Total Bilirubin: 0.8 mg/dL (ref 0.2–1.2)

## 2015-07-18 LAB — TSH: TSH: 4.49 u[IU]/mL (ref 0.35–4.50)

## 2015-07-18 MED ORDER — ROSUVASTATIN CALCIUM 5 MG PO TABS: 5.0000 mg | ORAL_TABLET | Freq: Every day | ORAL | Status: DC

## 2015-07-18 NOTE — Patient Instructions (Signed)
Get your blood work before you leave   Next visit in 6 months, please make an appointment   Fall Prevention and [place] cause injuries and can affect all age groups. It is possible to use preventive measures to significantly decrease the likelihood of falls. There are many simple measures which can make your home safer and prevent falls. OUTDOORS  Repair cracks and edges of walkways and driveways.  Remove high doorway thresholds.  Trim shrubbery on the main path into your home.  Have good outside lighting.  Clear walkways of tools, rocks, debris, and clutter.  Check that handrails are not broken and are securely fastened. Both sides of steps should have handrails.  Have leaves, snow, and ice cleared regularly.  Use sand or salt on walkways during winter months.  In the garage, clean up grease or oil spills. BATHROOM  Install night lights.  Install grab bars by the toilet and in the tub and shower.  Use non-skid mats or decals in the tub or shower.  Place a plastic non-slip stool in the shower to sit on, if needed.  Keep floors dry and clean up all water on the floor immediately.  Remove soap buildup in the tub or shower on a regular basis.  Secure bath mats with non-slip, double-sided rug tape.  Remove throw rugs and tripping hazards from the floors. BEDROOMS  Install night lights.  Make sure a bedside light is easy to reach.  Do not use oversized bedding.  Keep a telephone by your bedside.  Have a firm chair with side arms to use for getting dressed.  Remove throw rugs and tripping hazards from the floor. KITCHEN  Keep handles on pots and pans turned toward the center of the stove. Use back burners when possible.  Clean up spills quickly and allow time for drying.  Avoid walking on wet floors.  Avoid hot utensils and knives.  Position shelves so they are not too high or low.  Place commonly used objects within easy reach.  If necessary,  use a sturdy step stool with a grab bar when reaching.  Keep electrical cables out of the way.  Do not use floor polish or wax that makes floors slippery. If you must use wax, use non-skid floor wax.  Remove throw rugs and tripping hazards from the floor. STAIRWAYS  Never leave objects on stairs.  Place handrails on both sides of stairways and use them. Fix any loose handrails. Make sure handrails on both sides of the stairways are as long as the stairs.  Check carpeting to make sure it is firmly attached along stairs. Make repairs to worn or loose carpet promptly.  Avoid placing throw rugs at the top or bottom of stairways, or properly secure the rug with carpet tape to prevent slippage. Get rid of throw rugs, if possible.  Have an electrician put in a light switch at the top and bottom of the stairs. OTHER FALL PREVENTION TIPS  Wear low-heel or rubber-soled shoes that are supportive and fit well. Wear closed toe shoes.  When using a stepladder, make sure it is fully opened and both spreaders are firmly locked. Do not climb a closed stepladder.  Add color or contrast paint or tape to grab bars and handrails in your home. Place contrasting color strips on first and last steps.  Learn and use mobility aids as needed. Install an electrical emergency response system.  Turn on lights to avoid dark areas. Replace light bulbs that burn  out immediately. Get light switches that glow.  Arrange furniture to create clear pathways. Keep furniture in the same place.  Firmly attach carpet with non-skid or double-sided tape.  Eliminate uneven floor surfaces.  Select a carpet pattern that does not visually hide the edge of steps.  Be aware of all pets. OTHER HOME SAFETY TIPS  Set the water temperature for 120 F (48.8 C).  Keep emergency numbers on or near the telephone.  Keep smoke detectors on every level of the home and near sleeping areas. Document Released: 07/16/2002 Document  Revised: 01/25/2012 Document Reviewed: 10/15/2011 [facility] Patient Information 2015 [place], [place]. This information is not intended to replace advice given to you by your health care provider. Make sure you discuss any questions you have with your health care provider.   Preventive Care for Adults Ages 57 and over  Blood pressure check.** / Every 1 to 2 years.  Lipid and cholesterol check.**/ Every 5 years beginning at age 48.  Lung cancer screening. / Every year if you are aged 60-80 years and have a 30-pack-year history of smoking and currently smoke or have quit within the past 15 years. Yearly screening is stopped once you have quit smoking for at least 15 years or develop a health problem that would prevent you from having lung cancer treatment.  Fecal occult blood test (FOBT) of stool. / Every year beginning at age 80 and continuing until age 57. You may not have to do this test if you get a colonoscopy every 10 years.  Flexible sigmoidoscopy** or colonoscopy.** / Every 5 years for a flexible sigmoidoscopy or every 10 years for a colonoscopy beginning at age 2 and continuing until age 63.  Hepatitis C blood test.** / For all people born from [birth year] through 1965 and any individual with known risks for hepatitis C.  Abdominal aortic aneurysm (AAA) screening.** / A one-time screening for ages 80 to 29 years who are current or former smokers.  Skin self-exam. / Monthly.  Influenza vaccine. / Every year.  Tetanus, diphtheria, and acellular pertussis (Tdap/Td) vaccine.** / 1 dose of Td every 10 years.  Varicella vaccine.** / Consult your health care provider.  Zoster vaccine.** / 1 dose for adults aged 79 years or older.  Pneumococcal 13-valent conjugate (PCV13) vaccine.** / Consult your health care provider.  Pneumococcal polysaccharide (PPSV23) vaccine.** / 1 dose for all adults aged 12 years and older.  Meningococcal vaccine.** / Consult your health care provider.  Hepatitis  A vaccine.** / Consult your health care provider.  Hepatitis B vaccine.** / Consult your health care provider.  Haemophilus influenzae type b (Hib) vaccine.** / Consult your health care provider. **Family history and personal history of risk and conditions may change your health care provider's recommendations. Document Released: 09/21/2001 Document Revised: 07/31/2013 Document Reviewed: 12/21/2010 [facility] Patient Information 2015 [place], [place]. This information is not intended to replace advice given to you by your health care provider. Make sure you discuss any questions you have with your health care provider.

## 2015-07-18 NOTE — Progress Notes (Signed)
Pre visit review using our clinic review tool, if applicable. No additional management support is needed unless otherwise documented below in the visit note. 

## 2015-07-18 NOTE — Assessment & Plan Note (Signed)
Td 2008 , pneumonia shot 02-2008 , shingles shot --2012 prevnar --2015 fhad a flu shot   MMG -- see breast ca PAP 2001 (-) , see previous entry, no further PAPs  colon ca screening see previous entry--->  No further screening planned  Diet and exercise discussed

## 2015-07-18 NOTE — Progress Notes (Signed)
Subjective:    Patient ID: Ariel Medina, female    DOB: [birth year], 81 y.o.   MRN: [id number]  DOS:  07/18/2015 Type of visit - description :  Here for Medicare AWV: 1. Risk factors based on Past M, S, F history: yes   2. Physical Activities: active at home,independent on ADL, still drives   3. Depression/mood: neg screening   4. Hearing:mild decrease R hearing , ~ the same, no problems noted w/ normal conversation 5. ADL's: independent on all her ADLs 6. Fall Risk: no recent falls, prevention discussed     7. Home Safety: feels safe at home    8. Height, weight, &visual acuity:see VS, no problems with vision, sees eye doctor regulalrly 9. Counseling: provided , see below 10. Labs ordered based on risk factors: yes   11. Referral Coordination: if needed   12. Care Plan--- see A/P   13. Cognitive Assessment :  memory, alertness wnl; motor skill at baseline, gait-- small steps , slt unsteady  14. Team care updated  15. Daughter in law is the [place]  In addition, we discussed the following High cholesterol: Not taking Crestor every night. Has had difficult time cutting the pills. Hypothyroidism: Good compliance of medication HTN: On diuretics, no ambulatory BPs. Has tremors, not affecting her lifestyle  Wt Readings from Last 3 Encounters:  07/18/15 142 lb 2 oz (64.467 kg)  05/13/15 144 lb 8 oz (65.545 kg)  02/21/15 138 lb 2 oz (62.653 kg)      Review of Systems Constitutional: No fever. No chills. No unexplained wt changes. No unusual sweats  HEENT: No dental problems, no ear discharge, no facial swelling, no voice changes. No eye discharge, no eye  redness , no  intolerance to light   Respiratory: No wheezing , no  difficulty breathing. No cough , no mucus production  Cardiovascular: No CP, no leg swelling , no  Palpitations  GI: no nausea, no vomiting, no diarrhea , no  abdominal pain.  No blood in the stools. No dysphagia, no odynophagia    Endocrine: No  polyphagia, no polyuria , no polydipsia  GU: No dysuria, gross hematuria, difficulty urinating. No urinary urgency, no frequency.  Musculoskeletal: No joint swellings or unusual aches or pains  Skin: No change in the color of the skin, palor , no  Rash  Allergic, immunologic: No environmental allergies , no  food allergies  Neurological: No dizziness no  syncope. No headaches. No diplopia, no slurred, no slurred speech, no motor deficits, no facial  Numbness  Hematological: No enlarged lymph nodes, no easy bruising , no unusual bleedings  Psychiatry: No suicidal ideas, no hallucinations, no beavior problems, no confusion.  No unusual/severe anxiety, no depression   Past Medical History  Diagnosis Date  . Hyperlipidemia   . Hypertension   . Hypothyroidism   . Osteoarthritis   . Hiatal hernia   . Breast CA ([place])     left breast-invasive ductal ca stage 1-stopped arimidex 2008  . Diverticulitis of colon   . Syncope 2005    CT head (-), ECHO essent. neg, stress test (-), carotid u/s (-)  . Osteopenia     DEXA 7/02    Past Surgical History  Procedure Laterality Date  . Tonsillectomy and adenoidectomy    . Breast lumpectomy  04/2002    left    Social History   Social History  . Marital Status: Widowed    Spouse Name: N/A  . Number of Children:  2  . Years of Education: N/A   Occupational History  . n/a    Social History Main Topics  . Smoking status: Never Smoker   . Smokeless tobacco: Never Used  . Alcohol Use: No  . Drug Use: No  . Sexual Activity: Not on file   Other Topics Concern  . Not on file   Social History Narrative   Lost a son, her other son lives w/ her on-off [name] , one of my patients)   Widow since 71           Family History  Problem Relation Age of Onset  . Heart attack Brother 22  . Diabetes Mother   . Stroke Father 10  . Stroke Sister 52  . Colon cancer Neg Hx   . Breast cancer Neg Hx        Medication List         This list is accurate as of: 07/18/15 11:59 PM.  Always use your most recent med list.               acetaminophen 500 MG tablet  Commonly known as:  TYLENOL  Take 500 mg by mouth every 6 (six) hours as needed.     aspirin 81 MG tablet  Take 81 mg by mouth daily.     CALTRATE 600 PO  Take by mouth.     cyclobenzaprine 5 MG tablet  Commonly known as:  FLEXERIL  Take 1 tablet (5 mg total) by mouth at bedtime.     hydrochlorothiazide 25 MG tablet  Commonly known as:  HYDRODIURIL  Take 1 tablet (25 mg total) by mouth daily.     levothyroxine 75 MCG tablet  Commonly known as:  SYNTHROID, LEVOTHROID  Take 1 tablet (75 mcg total) by mouth daily.     rosuvastatin 5 MG tablet  Commonly known as:  CRESTOR  Take 1 tablet (5 mg total) by mouth daily.     VITAMIN D (CHOLECALCIFEROL) PO  Take by mouth.           Objective:   Physical Exam BP 118/66 mmHg  Pulse 73  Temp(Src) 97.5 F (36.4 C) (Oral)  Ht '5\' 3"'$  (1.6 m)  Wt 142 lb 2 oz (64.467 kg)  BMI 25.18 kg/m2  SpO2 92% General:   Well developed, well nourished . NAD.  Neck:  No thyromegaly Breast: no dominant mass, skin and nipples normal to inspection on palpation, axillary areas without mass or lymphadenopathy HEENT:  Normocephalic . Face symmetric, atraumatic Lungs:  CTA B Normal respiratory effort, no intercostal retractions, no accessory muscle use. Heart: RRR,  no murmur.  Trace pretibial edema bilaterally  Abdomen:  Not distended, soft, non-tender. No rebound or rigidity. No mass,organomegaly Skin: Exposed areas without rash. Not pale. Not jaundice Neurologic:  alert & oriented X3.  Speech normal, gait  unassisted, very small steps, at baseline Strength symmetric and appropriate for age. +tremors noted  Psych: Cognition and judgment appear intact.  Cooperative with normal attention span and concentration.  Behavior appropriate. No anxious or depressed appearing.    Assessment & Plan:  Assessment  > HTN Hyperlipidemia Hypothyroidism Osteopenia: T score -2.1 (04-2012) DC Fosamax 12/2013 after 5 years DJD GERD  Tremors, likely essential H/o diverticulitis Breast cancer, L breast, release from oncology 2013 Syncope 2005  Plan: HTN: Well control, check a BMP. Continue hydrochlorothiazide Hyperlipidemia: Has a hard time cutting Crestor 10 mg in half thus has not taken medication every night.  Change Crestor 5 mg one tablet every night. Check a FLP Osteopenia--Chart reviewed, plan is to get a bone density test next year if appropriate Breast cancer: Last mammograms 02-2015 negative, breast exam negative today. Hypothyroidism: Due for a TSH RTC 6 months

## 2015-07-20 NOTE — Assessment & Plan Note (Signed)
HTN: Well control, check a BMP. Continue hydrochlorothiazide Hyperlipidemia: Has a hard time cutting Crestor 10 mg in half thus has not taken medication every night. Change Crestor 5 mg one tablet every night. Check a FLP Osteopenia--Chart reviewed, plan is to get a bone density test next year if appropriate Breast cancer: Last mammograms 02-2015 negative, breast exam negative today. Hypothyroidism: Due for a TSH RTC 6 months

## 2015-07-21 MED ORDER — POTASSIUM CHLORIDE ER 10 MEQ PO TBCR: 10.0000 meq | EXTENDED_RELEASE_TABLET | Freq: Every day | ORAL | Status: DC

## 2015-07-21 NOTE — Addendum Note (Signed)
Addended byDamita Dunnings D on: 07/21/2015 10:13 AM   Modules accepted: Orders

## 2015-08-12 ENCOUNTER — Telehealth: Payer: Self-pay

## 2015-08-12 ENCOUNTER — Telehealth: Payer: Self-pay | Admitting: Internal Medicine

## 2015-08-12 ENCOUNTER — Other Ambulatory Visit: Payer: Self-pay

## 2015-08-12 MED ORDER — HYDROCHLOROTHIAZIDE 25 MG PO TABS: 25.0000 mg | ORAL_TABLET | Freq: Every day | ORAL | Status: DC

## 2015-08-12 MED ORDER — ROSUVASTATIN CALCIUM 5 MG PO TABS: 5.0000 mg | ORAL_TABLET | Freq: Every day | ORAL | Status: DC

## 2015-08-12 MED ORDER — FLUTICASONE PROPIONATE 50 MCG/ACT NA SUSP: 2.0000 | Freq: Every day | NASAL | Status: DC | PRN

## 2015-08-12 MED ORDER — LEVOTHYROXINE SODIUM 75 MCG PO TABS: 75.0000 ug | ORAL_TABLET | Freq: Every day | ORAL | Status: DC

## 2015-08-12 NOTE — Telephone Encounter (Signed)
Okay #30 and no refills

## 2015-08-12 NOTE — Telephone Encounter (Signed)
Noted. Will look for form.

## 2015-08-12 NOTE — Telephone Encounter (Signed)
Pt is requesting refill on Flexeril.  Last OV: 07/18/2015 Last Fill: 12/26/2014 #10 and 1RF   Okay to refill?

## 2015-08-12 NOTE — Telephone Encounter (Signed)
Caller name: Mardene Celeste Relation to pt: Daughter-In- Law Call back number: [phone] Pharmacy:  Reason for call: Pt's daughter-in-law wanted to inform pt's insurance changed to [facility] and the insurance is going to fax in document of prescription to have it set up and mailed to pt at home.

## 2015-08-13 MED ORDER — CYCLOBENZAPRINE HCL 5 MG PO TABS: 5.0000 mg | ORAL_TABLET | Freq: Every evening | ORAL | Status: DC | PRN

## 2015-08-13 NOTE — Telephone Encounter (Signed)
Rx sent 

## 2015-08-15 NOTE — Telephone Encounter (Signed)
Requested medications refilled 

## 2015-08-25 ENCOUNTER — Other Ambulatory Visit (INDEPENDENT_AMBULATORY_CARE_PROVIDER_SITE_OTHER): Payer: Commercial Managed Care - HMO

## 2015-08-25 DIAGNOSIS — E876 Hypokalemia: Secondary | ICD-10-CM | POA: Diagnosis not present

## 2015-08-25 LAB — BASIC METABOLIC PANEL
BUN: 12 mg/dL (ref 6–23)
CHLORIDE: 100 meq/L (ref 96–112)
CO2: 33 mEq/L — ABNORMAL HIGH (ref 19–32)
Calcium: 9.5 mg/dL (ref 8.4–10.5)
Creatinine, Ser: 0.7 mg/dL (ref 0.40–1.20)
GFR: 84.45 mL/min (ref >60.00)
GLUCOSE: 81 mg/dL (ref 70–99)
POTASSIUM: 3.7 meq/L (ref 3.5–5.1)
SODIUM: 141 meq/L (ref 135–145)

## 2015-08-28 ENCOUNTER — Ambulatory Visit: Payer: Medicare Other | Admitting: Podiatry

## 2015-09-03 ENCOUNTER — Ambulatory Visit: Payer: Medicare Other | Admitting: Podiatry

## 2015-09-03 ENCOUNTER — Other Ambulatory Visit: Payer: Self-pay | Admitting: Internal Medicine

## 2015-09-03 NOTE — Telephone Encounter (Signed)
Pt called in stating she does not need this. She received call from [facility] that they were requesting it from Dr. [name]. She said she has plenty left.

## 2015-09-03 NOTE — Telephone Encounter (Signed)
Noted  

## 2015-09-04 ENCOUNTER — Telehealth: Payer: Self-pay | Admitting: Internal Medicine

## 2015-09-04 DIAGNOSIS — Z008 Encounter for other general examination: Secondary | ICD-10-CM

## 2015-09-04 NOTE — Telephone Encounter (Signed)
Relation to YV:GCYO Call back [phone]   Reason for call:  Patient requesting a referral to Baker Janus DPM for toenail clipping and. Patient insurance changed to   [facility] Member ID # [id number] Plan  [id number] [id number] Providers Line # [phone] [place] [address]

## 2015-09-04 NOTE — Telephone Encounter (Signed)
Referral placed.

## 2015-09-10 ENCOUNTER — Telehealth: Payer: Self-pay | Admitting: Internal Medicine

## 2015-09-10 DIAGNOSIS — H9191 Unspecified hearing loss, right ear: Secondary | ICD-10-CM

## 2015-09-10 NOTE — Telephone Encounter (Signed)
Okay to place referral

## 2015-09-10 NOTE — Telephone Encounter (Signed)
Referral placed.

## 2015-09-10 NOTE — Telephone Encounter (Signed)
Yes please

## 2015-09-10 NOTE — Telephone Encounter (Signed)
Relation to pt: self Call back number:[phone]  Reason for call:  Patient requesting a referral to [facility] for hearing loss in right ear.  [facility] [address], [place], [place] [address] [phone]

## 2015-09-11 ENCOUNTER — Encounter: Payer: Self-pay | Admitting: Podiatry

## 2015-09-11 ENCOUNTER — Ambulatory Visit (INDEPENDENT_AMBULATORY_CARE_PROVIDER_SITE_OTHER): Payer: Commercial Managed Care - HMO | Admitting: Podiatry

## 2015-09-11 DIAGNOSIS — B351 Tinea unguium: Secondary | ICD-10-CM

## 2015-09-11 DIAGNOSIS — M79676 Pain in unspecified toe(s): Secondary | ICD-10-CM | POA: Diagnosis not present

## 2015-09-11 DIAGNOSIS — Q828 Other specified congenital malformations of skin: Secondary | ICD-10-CM

## 2015-09-11 NOTE — Progress Notes (Signed)
Patient ID: Ariel Medina, female   DOB: 10-04-1927, 82 y.o.   MRN: [id number] Complaint:  Visit Type: Patient returns to my office for continued preventative foot care services. Complaint: Patient states" my nails have grown long and thick and become painful to walk and wear shoes" . She presents for preventative foot care services. No changes to ROS.  She has painful calluses on the bottom of both feet.  Podiatric Exam: Vascular: dorsalis pedis and posterior tibial pulses are palpable bilateral. Capillary return is immediate. Temperature gradient is WNL. Skin turgor WNL  Sensorium: Normal Semmes Weinstein monofilament test. Normal tactile sensation bilaterally. Nail Exam: Pt has thick disfigured discolored nails with subungual debris noted left foot entire nail hallux through fifth toenails and hallux right foot. Ulcer Exam: There is no evidence of ulcer or pre-ulcerative changes or infection. Orthopedic Exam: Muscle tone and strength are WNL. No limitations in general ROM. No crepitus or effusions noted. Foot type and digits show no abnormalities. Bony prominences are unremarkable. Skin:  Porokeratosis sub fifth mets B/L.Marland Kitchen No infection or ulcers  Diagnosis:  Tinea unguium, Pain in right toe, pain in left toes, Porokeratosis 5th B/L  Treatment & Plan Procedures and Treatment: Consent by patient was obtained for treatment procedures. The patient understood the discussion of treatment and procedures well. All questions were answered thoroughly reviewed. Debridement of mycotic and hypertrophic toenails, 1 through 5 bilateral and clearing of subungual debris. No ulceration, no infection noted. Debridement of porokeratosis Return Visit-Office Procedure: Patient instructed to return to the office for a follow up visit 3 months for continued evaluation and treatment.   Gardiner Barefoot DPM

## 2015-09-19 DIAGNOSIS — H9190 Unspecified hearing loss, unspecified ear: Secondary | ICD-10-CM | POA: Insufficient documentation

## 2015-09-19 DIAGNOSIS — H9201 Otalgia, right ear: Secondary | ICD-10-CM | POA: Diagnosis not present

## 2015-09-19 DIAGNOSIS — H9191 Unspecified hearing loss, right ear: Secondary | ICD-10-CM | POA: Diagnosis not present

## 2015-09-24 ENCOUNTER — Telehealth: Payer: Self-pay | Admitting: Internal Medicine

## 2015-09-24 MED ORDER — POTASSIUM CHLORIDE ER 10 MEQ PO TBCR: 10.0000 meq | EXTENDED_RELEASE_TABLET | Freq: Every day | ORAL | Status: DC

## 2015-09-24 NOTE — Telephone Encounter (Signed)
Rx sent 

## 2015-09-24 NOTE — Telephone Encounter (Signed)
Caller name: Self  Can be reached: [phone]  Pharmacy:  [facility], [place] [phone] (Phone) [phone] (Fax)        Reason for call: Refill potassium chloride (KLOR-CON 10) 10 MEQ tablet [[id number]]

## 2015-10-14 ENCOUNTER — Telehealth: Payer: Self-pay | Admitting: Internal Medicine

## 2015-10-14 NOTE — Telephone Encounter (Signed)
6 month supply of Levothyroxine and HCTZ was sent to [facility] on 08/12/2015.

## 2015-10-14 NOTE — Telephone Encounter (Signed)
Relationship to patient: Self   Can be reached: [phone]   Pharmacy: [place], [place] [facility] [address]   Reason for call: pt is requesting a refill on 2 medications.  1.levothyroxine     2. hydrochlorothiazide

## 2015-12-11 ENCOUNTER — Encounter: Payer: Self-pay | Admitting: Podiatry

## 2015-12-11 ENCOUNTER — Ambulatory Visit (INDEPENDENT_AMBULATORY_CARE_PROVIDER_SITE_OTHER): Payer: Commercial Managed Care - HMO | Admitting: Podiatry

## 2015-12-11 DIAGNOSIS — M79676 Pain in unspecified toe(s): Secondary | ICD-10-CM

## 2015-12-11 DIAGNOSIS — B351 Tinea unguium: Secondary | ICD-10-CM | POA: Diagnosis not present

## 2015-12-11 DIAGNOSIS — Q828 Other specified congenital malformations of skin: Secondary | ICD-10-CM | POA: Diagnosis not present

## 2015-12-11 NOTE — Progress Notes (Signed)
Patient ID: Ariel Medina, female   DOB: 10-Aug-1927, 82 y.o.   MRN: [id number] Complaint:  Visit Type: Patient returns to my office for continued preventative foot care services. Complaint: Patient states" my nails have grown long and thick and become painful to walk and wear shoes" . She presents for preventative foot care services. No changes to ROS.  She has painful calluses on the bottom of both feet.  Podiatric Exam: Vascular: dorsalis pedis and posterior tibial pulses are palpable bilateral. Capillary return is immediate. Temperature gradient is WNL. Skin turgor WNL  Sensorium: Normal Semmes Weinstein monofilament test. Normal tactile sensation bilaterally. Nail Exam: Pt has thick disfigured discolored nails with subungual debris noted left foot entire nail hallux through fifth toenails and hallux right foot. Ulcer Exam: There is no evidence of ulcer or pre-ulcerative changes or infection. Orthopedic Exam: Muscle tone and strength are WNL. No limitations in general ROM. No crepitus or effusions noted. Foot type and digits show no abnormalities. Bony prominences are unremarkable. Skin:  Porokeratosis sub fifth mets B/L.Marland Kitchen No infection or ulcers  Diagnosis:  Tinea unguium, Pain in right toe, pain in left toes, Porokeratosis 5th B/L  Treatment & Plan Procedures and Treatment: Consent by patient was obtained for treatment procedures. The patient understood the discussion of treatment and procedures well. All questions were answered thoroughly reviewed. Debridement of mycotic and hypertrophic toenails, 1 through 5 bilateral and clearing of subungual debris. No ulceration, no infection noted. Debridement of porokeratosis Return Visit-Office Procedure: Patient instructed to return to the office for a follow up visit 3 months for continued evaluation and treatment.   Gardiner Barefoot DPM

## 2015-12-29 ENCOUNTER — Other Ambulatory Visit: Payer: Self-pay | Admitting: Internal Medicine

## 2016-01-16 ENCOUNTER — Encounter: Payer: Self-pay | Admitting: Internal Medicine

## 2016-01-16 ENCOUNTER — Ambulatory Visit (INDEPENDENT_AMBULATORY_CARE_PROVIDER_SITE_OTHER): Payer: Commercial Managed Care - HMO | Admitting: Internal Medicine

## 2016-01-16 VITALS — BP 122/74 | HR 97 | Temp 98.1°F | Ht 63.0 in | Wt 143.4 lb

## 2016-01-16 DIAGNOSIS — I10 Essential (primary) hypertension: Secondary | ICD-10-CM

## 2016-01-16 DIAGNOSIS — E039 Hypothyroidism, unspecified: Secondary | ICD-10-CM

## 2016-01-16 LAB — BASIC METABOLIC PANEL
BUN: 15 mg/dL (ref 6–23)
CHLORIDE: 98 meq/L (ref 96–112)
CO2: 35 mEq/L — ABNORMAL HIGH (ref 19–32)
Calcium: 9.8 mg/dL (ref 8.4–10.5)
Creatinine, Ser: 0.72 mg/dL (ref 0.40–1.20)
GFR: 81.67 mL/min (ref >60.00)
GLUCOSE: 64 mg/dL — AB (ref 70–99)
POTASSIUM: 3.5 meq/L (ref 3.5–5.1)
SODIUM: 139 meq/L (ref 135–145)

## 2016-01-16 LAB — TSH: TSH: 4.1 u[IU]/mL (ref 0.35–4.50)

## 2016-01-16 NOTE — Progress Notes (Signed)
Subjective:    Patient ID: Ariel Medina, female    DOB: 17-Jun-1928, 82 y.o.   MRN: [id number]  DOS:  01/16/2016 Type of visit - description : Routine checkup Interval history: Good medication compliance without apparent side effects DJD, continue with pain, limiting her activities to some degree. Nevertheless she is trying to remain active.    Review of Systems  No chest pain or difficulty breathing No nausea, vomiting, diarrhea Past Medical History  Diagnosis Date  . Hyperlipidemia   . Hypertension   . Hypothyroidism   . Osteoarthritis   . Hiatal hernia   . Breast CA ([place])     left breast-invasive ductal ca stage 1-stopped arimidex 2008  . Diverticulitis of colon   . Syncope 2005    CT head (-), ECHO essent. neg, stress test (-), carotid u/s (-)  . Osteopenia     DEXA 7/02    Past Surgical History  Procedure Laterality Date  . Tonsillectomy and adenoidectomy    . Breast lumpectomy  04/2002    left    Social History   Social History  . Marital Status: Widowed    Spouse Name: N/A  . Number of Children: 2  . Years of Education: N/A   Occupational History  . n/a    Social History Main Topics  . Smoking status: Never Smoker   . Smokeless tobacco: Never Used  . Alcohol Use: No  . Drug Use: No  . Sexual Activity: Not on file   Other Topics Concern  . Not on file   Social History Narrative   Lost a son, her other son lives w/ her on-off [name] , one of my patients)   Widow since 1993              Medication List       This list is accurate as of: 01/16/16 10:26 AM.  Always use your most recent med list.               acetaminophen 500 MG tablet  Commonly known as:  TYLENOL  Take 500 mg by mouth every 6 (six) hours as needed.     aspirin 81 MG tablet  Take 81 mg by mouth daily.     CALTRATE 600 PO  Take by mouth.     cyclobenzaprine 5 MG tablet  Commonly known as:  FLEXERIL  Take 1 tablet (5 mg total) by mouth at bedtime as  needed for muscle spasms.     fluticasone 50 MCG/ACT nasal spray  Commonly known as:  FLONASE  Place 2 sprays into both nostrils daily as needed for allergies or rhinitis.     hydrochlorothiazide 25 MG tablet  Commonly known as:  HYDRODIURIL  Take 1 tablet (25 mg total) by mouth daily.     levothyroxine 75 MCG tablet  Commonly known as:  SYNTHROID, LEVOTHROID  Take 1 tablet (75 mcg total) by mouth daily before breakfast.     potassium chloride 10 MEQ tablet  Commonly known as:  KLOR-CON 10  Take 1 tablet (10 mEq total) by mouth daily.     rosuvastatin 5 MG tablet  Commonly known as:  CRESTOR  Take 1 tablet (5 mg total) by mouth daily.     VITAMIN D (CHOLECALCIFEROL) PO  Take by mouth.           Objective:   Physical Exam BP 122/74 mmHg  Pulse 97  Temp(Src) 98.1 F (36.7 C) (Oral)  Ht  $'5\' 3"'Y$  (1.6 m)  Wt 143 lb 6 oz (65.034 kg)  BMI 25.40 kg/m2  SpO2 94% General:   Well developed, well nourished . NAD.  HEENT:  Normocephalic . Face symmetric, atraumatic Lungs:  CTA B Normal respiratory effort, no intercostal retractions, no accessory muscle use. Heart: RRR,  no murmur.  Trace pretibial edema bilaterally  Neurologic:  alert & oriented X3.  Speech normal, gait  unassisted, very small steps, at baseline Strength symmetric and appropriate for age. +tremors noted  Psych: Cognition and judgment appear intact.  Cooperative with normal attention span and concentration.  Behavior appropriate. No anxious or depressed appearing.    Assessment & Plan:  Assessment > HTN Hyperlipidemia Hypothyroidism Osteopenia: T score -2.1 (04-2012) DC Fosamax 12/2013 after 5 years DJD GERD  Tremors, likely essential H/o diverticulitis Breast cancer, L breast, release from oncology 2013 Syncope 2005  PLAN: HTN: Since the last office visit she started potassium supplements. Continue HCTZ, potassium, check a BMP Hypothyroidism: Good compliance with Synthroid, check a TSH DJD:  Pain is a mild issue, reminded her to take Tylenol as needed RTC 07/2016, CPX

## 2016-01-16 NOTE — Progress Notes (Signed)
Pre visit review using our clinic review tool, if applicable. No additional management support is needed unless otherwise documented below in the visit note. 

## 2016-01-16 NOTE — Patient Instructions (Signed)
GO TO THE LAB : Get the blood work     GO TO THE FRONT DESK Schedule your next appointment for a  Yearly check up by 07-2016, fasting

## 2016-01-16 NOTE — Assessment & Plan Note (Signed)
HTN: Since the last office visit she started potassium supplements. Continue HCTZ, potassium, check a BMP Hypothyroidism: Good compliance with Synthroid, check a TSH DJD: Pain is a mild issue, reminded her to take Tylenol as needed RTC 07/2016, CPX

## 2016-01-19 ENCOUNTER — Telehealth: Payer: Self-pay | Admitting: Internal Medicine

## 2016-01-19 MED ORDER — LEVOTHYROXINE SODIUM 75 MCG PO TABS: 75.0000 ug | ORAL_TABLET | Freq: Every day | ORAL | Status: DC

## 2016-01-19 NOTE — Telephone Encounter (Signed)
°  Relationship to patient: Self  Can be reached: [phone]  Pharmacy:  [place], [place] [phone] ([phone] (Fax)         Reason for call: Request refill on levothyroxine (SYNTHROID, LEVOTHROID) 75 MCG tablet [[id number]]

## 2016-01-19 NOTE — Telephone Encounter (Signed)
Rx sent 

## 2016-02-11 ENCOUNTER — Other Ambulatory Visit: Payer: Self-pay | Admitting: Internal Medicine

## 2016-02-24 ENCOUNTER — Telehealth: Payer: Self-pay | Admitting: Internal Medicine

## 2016-02-24 MED ORDER — ROSUVASTATIN CALCIUM 5 MG PO TABS: 5.0000 mg | ORAL_TABLET | Freq: Every day | ORAL | Status: DC

## 2016-02-24 NOTE — Telephone Encounter (Signed)
Refill request for rosuvastatin    Pharmacy: [place], [place]

## 2016-02-24 NOTE — Telephone Encounter (Signed)
Rx sent 

## 2016-03-10 ENCOUNTER — Ambulatory Visit (INDEPENDENT_AMBULATORY_CARE_PROVIDER_SITE_OTHER): Payer: Commercial Managed Care - HMO | Admitting: Podiatry

## 2016-03-10 ENCOUNTER — Encounter: Payer: Self-pay | Admitting: Podiatry

## 2016-03-10 DIAGNOSIS — M79676 Pain in unspecified toe(s): Secondary | ICD-10-CM | POA: Diagnosis not present

## 2016-03-10 DIAGNOSIS — B351 Tinea unguium: Secondary | ICD-10-CM | POA: Diagnosis not present

## 2016-03-10 DIAGNOSIS — Q828 Other specified congenital malformations of skin: Secondary | ICD-10-CM

## 2016-03-10 NOTE — Progress Notes (Signed)
Patient ID: Ariel Medina, female   DOB: 01-Feb-1928, 82 y.o.   MRN: [id number] Complaint:  Visit Type: Patient returns to my office for continued preventative foot care services. Complaint: Patient states" my nails have grown long and thick and become painful to walk and wear shoes" . She presents for preventative foot care services. No changes to ROS.  She has painful calluses on the bottom of both feet.  Podiatric Exam: Vascular: dorsalis pedis and posterior tibial pulses are palpable bilateral. Capillary return is immediate. Temperature gradient is WNL. Skin turgor WNL  Sensorium: Normal Semmes Weinstein monofilament test. Normal tactile sensation bilaterally. Nail Exam: Pt has thick disfigured discolored nails with subungual debris noted left foot entire nail hallux through fifth toenails and hallux right foot. Ulcer Exam: There is no evidence of ulcer or pre-ulcerative changes or infection. Orthopedic Exam: Muscle tone and strength are WNL. No limitations in general ROM. No crepitus or effusions noted. Foot type and digits show no abnormalities. Bony prominences are unremarkable. Skin:  Porokeratosis sub fifth mets B/L.Marland Kitchen No infection or ulcers  Diagnosis:  Tinea unguium, Pain in right toe, pain in left toes, Porokeratosis 5th B/L  Treatment & Plan Procedures and Treatment: Consent by patient was obtained for treatment procedures. The patient understood the discussion of treatment and procedures well. All questions were answered thoroughly reviewed. Debridement of mycotic and hypertrophic toenails, 1 through 5 bilateral and clearing of subungual debris. No ulceration, no infection noted. Debridement of porokeratosis Return Visit-Office Procedure: Patient instructed to return to the office for a follow up visit 3 months for continued evaluation and treatment.   Gardiner Barefoot DPM

## 2016-05-25 ENCOUNTER — Other Ambulatory Visit: Payer: Self-pay | Admitting: Internal Medicine

## 2016-05-25 DIAGNOSIS — Z1231 Encounter for screening mammogram for malignant neoplasm of breast: Secondary | ICD-10-CM

## 2016-05-27 ENCOUNTER — Ambulatory Visit (HOSPITAL_BASED_OUTPATIENT_CLINIC_OR_DEPARTMENT_OTHER)
Admission: RE | Admit: 2016-05-27 | Discharge: 2016-05-27 | Disposition: A | Discharge status: Home / Self Care | Payer: Commercial Managed Care - HMO | Source: Ambulatory Visit | Attending: Internal Medicine | Admitting: Internal Medicine

## 2016-05-27 DIAGNOSIS — Z1231 Encounter for screening mammogram for malignant neoplasm of breast: Secondary | ICD-10-CM

## 2016-06-02 ENCOUNTER — Ambulatory Visit (INDEPENDENT_AMBULATORY_CARE_PROVIDER_SITE_OTHER): Payer: Commercial Managed Care - HMO | Admitting: Podiatry

## 2016-06-02 ENCOUNTER — Encounter: Payer: Self-pay | Admitting: Podiatry

## 2016-06-02 VITALS — Ht 63.0 in | Wt 143.0 lb

## 2016-06-02 DIAGNOSIS — Q828 Other specified congenital malformations of skin: Secondary | ICD-10-CM

## 2016-06-02 DIAGNOSIS — B351 Tinea unguium: Secondary | ICD-10-CM

## 2016-06-02 DIAGNOSIS — M79676 Pain in unspecified toe(s): Secondary | ICD-10-CM

## 2016-06-02 NOTE — Progress Notes (Signed)
Patient ID: Ariel Medina, female   DOB: Dec 22, 1927, 82 y.o.   MRN: [id number] Complaint:  Visit Type: Patient returns to my office for continued preventative foot care services. Complaint: Patient states" my nails have grown long and thick and become painful to walk and wear shoes" . She presents for preventative foot care services. No changes to ROS.  She has painful calluses on the bottom of both feet.  Podiatric Exam: Vascular: dorsalis pedis and posterior tibial pulses are palpable bilateral. Capillary return is immediate. Temperature gradient is WNL. Skin turgor WNL  Sensorium: Normal Semmes Weinstein monofilament test. Normal tactile sensation bilaterally. Nail Exam: Pt has thick disfigured discolored nails with subungual debris noted left foot entire nail hallux through fifth toenails and hallux right foot. Ulcer Exam: There is no evidence of ulcer or pre-ulcerative changes or infection. Orthopedic Exam: Muscle tone and strength are WNL. No limitations in general ROM. No crepitus or effusions noted. Foot type and digits show no abnormalities. Bony prominences are unremarkable. Skin:  Porokeratosis sub fifth mets B/L.Marland Kitchen No infection or ulcers  Diagnosis:  Tinea unguium, Pain in right toe, pain in left toes, Porokeratosis 5th B/L  Treatment & Plan Procedures and Treatment: Consent by patient was obtained for treatment procedures. The patient understood the discussion of treatment and procedures well. All questions were answered thoroughly reviewed. Debridement of mycotic and hypertrophic toenails, 1 through 5 bilateral and clearing of subungual debris. No ulceration, no infection noted. Debridement of porokeratosis Return Visit-Office Procedure: Patient instructed to return to the office for a follow up visit 3 months for continued evaluation and treatment.   Gardiner Barefoot DPM

## 2016-06-04 ENCOUNTER — Other Ambulatory Visit: Payer: Self-pay | Admitting: Internal Medicine

## 2016-06-04 NOTE — Telephone Encounter (Signed)
Medication filled to pharmacy as requested.   

## 2016-07-20 ENCOUNTER — Ambulatory Visit (INDEPENDENT_AMBULATORY_CARE_PROVIDER_SITE_OTHER): Payer: Commercial Managed Care - HMO | Admitting: Internal Medicine

## 2016-07-20 ENCOUNTER — Encounter: Payer: Self-pay | Admitting: Internal Medicine

## 2016-07-20 VITALS — BP 122/72 | HR 88 | Temp 97.7°F | Resp 14 | Ht 63.0 in | Wt 142.1 lb

## 2016-07-20 DIAGNOSIS — Z23 Encounter for immunization: Secondary | ICD-10-CM | POA: Diagnosis not present

## 2016-07-20 DIAGNOSIS — E785 Hyperlipidemia, unspecified: Secondary | ICD-10-CM | POA: Diagnosis not present

## 2016-07-20 DIAGNOSIS — E033 Postinfectious hypothyroidism: Secondary | ICD-10-CM

## 2016-07-20 DIAGNOSIS — Z0000 Encounter for general adult medical examination without abnormal findings: Secondary | ICD-10-CM

## 2016-07-20 LAB — LIPID PANEL
CHOLESTEROL: 184 mg/dL (ref 0–200)
HDL: 64 mg/dL (ref >39.00)
LDL Cholesterol: 103 mg/dL — ABNORMAL HIGH (ref 0–99)
NONHDL: 120.47
Total CHOL/HDL Ratio: 3
Triglycerides: 88 mg/dL (ref 0.0–149.0)
VLDL: 17.6 mg/dL (ref 0.0–40.0)

## 2016-07-20 LAB — ALT: ALT: 8 U/L (ref 0–35)

## 2016-07-20 LAB — AST: AST: 17 U/L (ref 0–37)

## 2016-07-20 LAB — BASIC METABOLIC PANEL
BUN: 12 mg/dL (ref 6–23)
CALCIUM: 9.7 mg/dL (ref 8.4–10.5)
CHLORIDE: 104 meq/L (ref 96–112)
CO2: 32 mEq/L (ref 19–32)
CREATININE: 0.71 mg/dL (ref 0.40–1.20)
GFR: 82.9 mL/min (ref >60.00)
Glucose, Bld: 95 mg/dL (ref 70–99)
Potassium: 3.9 mEq/L (ref 3.5–5.1)
SODIUM: 142 meq/L (ref 135–145)

## 2016-07-20 LAB — TSH: TSH: 2.99 u[IU]/mL (ref 0.35–4.50)

## 2016-07-20 NOTE — Patient Instructions (Signed)
GO TO THE LAB : Get the blood work     GO TO THE FRONT DESK Schedule your next appointment for a  routine check up with me in 6 months.  Also, please schedule Medicare wellness with one of Registered Nurses  at your earliest convenience

## 2016-07-20 NOTE — Progress Notes (Signed)
Subjective:    Patient ID: Ariel Medina, female    DOB: 14-May-1928, 82 y.o.   MRN: [id number]  DOS:  07/20/2016 Type of visit - description : cpx Interval history: In general feeling well, no major concerns.   Review of Systems Constitutional: No fever. No chills. No unexplained wt changes. No unusual sweats  HEENT: No dental problems, no ear discharge, no facial swelling, no voice changes. No eye discharge, no eye  redness , no  intolerance to light   Respiratory: No wheezing , no  difficulty breathing. No cough , no mucus production  Cardiovascular: No CP, no leg swelling , no  Palpitations  GI: no nausea, no vomiting, no diarrhea , no  abdominal pain.  No blood in the stools. No dysphagia, no odynophagia    Endocrine: No polyphagia, no polyuria , no polydipsia  GU: No dysuria, gross hematuria, difficulty urinating. No urinary urgency, no frequency.  Musculoskeletal: Some pain is at baseline.  Skin: No change in the color of the skin, palor , no  Rash  Allergic, immunologic: No environmental allergies , no  food allergies  Neurological: No dizziness no  syncope. No headaches. No diplopia, no slurred, no slurred speech, no motor deficits, no facial  Numbness  Hematological: No enlarged lymph nodes, no easy bruising , no unusual bleedings  Psychiatry: No suicidal ideas, no hallucinations, no beavior problems, no confusion.  No unusual/severe anxiety, no depression   Past Medical History:  Diagnosis Date  . Breast CA ([place])    left breast-invasive ductal ca stage 1-stopped arimidex 2008  . Diverticulitis of colon   . Hiatal hernia   . Hyperlipidemia   . Hypertension   . Hypothyroidism   . Osteoarthritis   . Osteopenia    DEXA 7/02  . Syncope 2005   CT head (-), ECHO essent. neg, stress test (-), carotid u/s (-)    Past Surgical History:  Procedure Laterality Date  . BREAST LUMPECTOMY  04/2002   left  . TONSILLECTOMY AND ADENOIDECTOMY      Social History    Social History  . Marital status: Widowed    Spouse name: N/A  . Number of children: 2  . Years of education: N/A   Occupational History  . n/a Retired   Social History Main Topics  . Smoking status: Never Smoker  . Smokeless tobacco: Never Used  . Alcohol use No  . Drug use: No  . Sexual activity: Not on file   Other Topics Concern  . Not on file   Social History Narrative   Lost a son, her other son lives w/ her on-off [name] , one of my patients)   Widow since 89           Family History  Problem Relation Age of Onset  . Heart attack Brother 50  . Diabetes Mother   . Stroke Father 74  . Stroke Sister 18  . Colon cancer Neg Hx   . Breast cancer Neg Hx        Medication List       Accurate as of 07/20/16  8:23 AM. Always use your most recent med list.          acetaminophen 500 MG tablet Commonly known as:  TYLENOL Take 500 mg by mouth every 6 (six) hours as needed.   aspirin 81 MG tablet Take 81 mg by mouth daily.   CALTRATE 600 PO Take by mouth.   cyclobenzaprine 5 MG  tablet Commonly known as:  FLEXERIL Take 1 tablet (5 mg total) by mouth at bedtime as needed for muscle spasms.   fluticasone 50 MCG/ACT nasal spray Commonly known as:  FLONASE Place 2 sprays into both nostrils daily as needed for allergies or rhinitis.   hydrochlorothiazide 25 MG tablet Commonly known as:  HYDRODIURIL Take 1 tablet (25 mg total) by mouth daily.   levothyroxine 75 MCG tablet Commonly known as:  SYNTHROID, LEVOTHROID TAKE 1 TABLET EVERY DAY BEFORE BREAKFAST   potassium chloride 10 MEQ tablet Commonly known as:  K-DUR Take 1 tablet (10 mEq total) by mouth daily.   rosuvastatin 5 MG tablet Commonly known as:  CRESTOR Take 1 tablet (5 mg total) by mouth daily.   VITAMIN D (CHOLECALCIFEROL) PO Take by mouth.          Objective:   Physical Exam BP 122/72 (BP Location: Right Arm, Patient Position: Sitting, Cuff Size: Small)   Pulse 88    Temp 97.7 F (36.5 C) (Oral)   Resp 14   Ht '5\' 3"'$  (1.6 m)   Wt 142 lb 2 oz (64.5 kg)   SpO2 93%   BMI 25.18 kg/m   General:   Well developed, well nourished . NAD.  Neck: No  thyromegaly  breast exam: declined  HEENT:  Normocephalic . Face symmetric, atraumatic Lungs:  CTA B. Slightly increased expiratory time but no wheezing. Normal respiratory effort, no intercostal retractions, no accessory muscle use. Heart: RRR,  no murmur.  No pretibial edema bilaterally  Abdomen:  Not distended, soft, non-tender. No rebound or rigidity.   Skin: Exposed areas without rash. Not pale. Not jaundice Neurologic:  alert & oriented X3.  Speech normal, gait  unassisted, very small steps, at baseline. Strength symmetric and appropriate for age. + Tremors, mostly head/chin Psych: Cognition and judgment appear intact.  Cooperative with normal attention span and concentration.  Behavior appropriate. No anxious or depressed appearing.    Assessment & Plan:   Assessment  HTN Hyperlipidemia Hypothyroidism Osteopenia:  T score -2.1 (04-2012) DC Fosamax 12/2013 after 5 years T score -1.5   (02-2015)  DJD GERD  Tremors, likely essential H/o diverticulitis Breast cancer, L breast, release from oncology 2013 Syncope 2005  PLAN: Here for CPX, in general doing well. HTN: No ambulatory BPs, BP today is very good, continue HCTZ, potassium. Hyperlipidemia: Continue Crestor, checking labs Hypothyroidism: On Synthroid, check labs RTC 6 months.

## 2016-07-20 NOTE — Assessment & Plan Note (Signed)
HTN: No ambulatory BPs, BP today is very good, continue HCTZ, potassium. Hyperlipidemia: Continue Crestor, checking labs Hypothyroidism: On Synthroid, check labs RTC 6 months.

## 2016-07-20 NOTE — Progress Notes (Signed)
Pre visit review using our clinic review tool, if applicable. No additional management support is needed unless otherwise documented below in the visit note. 

## 2016-07-20 NOTE — Assessment & Plan Note (Addendum)
Td 2008 , pneumonia shot 02-2008 , shingles shot --2012; prevnar --2015 flu shot today  MMG -- (-) 05-2016, Breast exam- declined  No  further PAPs  CCS:  No further screening planned  Doing well, living independently, drives, no recent falls. Has family members nearby. Discussed fall precautions. Recommend to have for Medicare wellness Labs: BMP, AST, ALT, FLP, TSH.

## 2016-07-28 ENCOUNTER — Other Ambulatory Visit: Payer: Self-pay | Admitting: Internal Medicine

## 2016-08-25 ENCOUNTER — Ambulatory Visit: Payer: Commercial Managed Care - HMO | Admitting: Podiatry

## 2016-08-30 ENCOUNTER — Other Ambulatory Visit: Payer: Self-pay | Admitting: Internal Medicine

## 2016-08-31 ENCOUNTER — Encounter: Payer: Self-pay | Admitting: Internal Medicine

## 2016-08-31 NOTE — Telephone Encounter (Signed)
error:[id number] ° °

## 2016-09-01 ENCOUNTER — Encounter: Payer: Self-pay | Admitting: Podiatry

## 2016-09-01 ENCOUNTER — Ambulatory Visit (INDEPENDENT_AMBULATORY_CARE_PROVIDER_SITE_OTHER): Payer: Medicare HMO | Admitting: Podiatry

## 2016-09-01 VITALS — Ht 63.0 in | Wt 142.0 lb

## 2016-09-01 DIAGNOSIS — B351 Tinea unguium: Secondary | ICD-10-CM

## 2016-09-01 DIAGNOSIS — Q828 Other specified congenital malformations of skin: Secondary | ICD-10-CM

## 2016-09-01 DIAGNOSIS — M79676 Pain in unspecified toe(s): Secondary | ICD-10-CM | POA: Diagnosis not present

## 2016-09-01 NOTE — Progress Notes (Signed)
Patient ID: Ariel Medina, female   DOB: [birth year], 83 y.o.   MRN: [id number] Complaint:  Visit Type: Patient returns to my office for continued preventative foot care services. Complaint: Patient states" my nails have grown long and thick and become painful to walk and wear shoes" . She presents for preventative foot care services. No changes to ROS.  She has painful calluses on the bottom of both feet.  Podiatric Exam: Vascular: dorsalis pedis and posterior tibial pulses are palpable bilateral. Capillary return is immediate. Temperature gradient is WNL. Skin turgor WNL  Sensorium: Normal Semmes Weinstein monofilament test. Normal tactile sensation bilaterally. Nail Exam: Pt has thick disfigured discolored nails with subungual debris noted left foot entire nail hallux through fifth toenails and hallux right foot. Ulcer Exam: There is no evidence of ulcer or pre-ulcerative changes or infection. Orthopedic Exam: Muscle tone and strength are WNL. No limitations in general ROM. No crepitus or effusions noted. Foot type and digits show no abnormalities. Bony prominences are unremarkable. Skin:  Porokeratosis sub fifth mets B/L.Marland Kitchen No infection or ulcers  Diagnosis:  Tinea unguium, Pain in right toe, pain in left toes, Porokeratosis 5th B/L  Treatment & Plan Procedures and Treatment: Consent by patient was obtained for treatment procedures. The patient understood the discussion of treatment and procedures well. All questions were answered thoroughly reviewed. Debridement of mycotic and hypertrophic toenails, 1 through 5 bilateral and clearing of subungual debris. No ulceration, no infection noted. Debridement of porokeratosis Return Visit-Office Procedure: Patient instructed to return to the office for a follow up visit 3 months for continued evaluation and treatment.   Gardiner Barefoot DPM

## 2016-10-14 NOTE — Progress Notes (Deleted)
Pre visit review using our clinic review tool, if applicable. No additional management support is needed unless otherwise documented below in the visit note. 

## 2016-10-14 NOTE — Progress Notes (Deleted)
Subjective:   Ariel Medina is a 83 y.o. female who presents for Medicare Annual (Subsequent) preventive examination.  Review of Systems:  No ROS.  Medicare Wellness Visit.    Sleep patterns: {SX; SLEEP PATTERNS:18802::"feels rested on waking","does not get up to void","gets up *** times nightly to void","sleeps *** hours nightly"}.   Home Safety/Smoke Alarms:   Living environment; residence and Firearm Safety: {Rehab home environment / accessibility:30080::"no firearms","firearms stored safely"}. Seat Belt Safety/Bike Helmet: Wears seat belt.   Counseling:   Eye Exam-  Dental-  Female:   Pap-       Mammo- Last 05/27/16: BI-RADS CATEGORY  1: Negative.      Dexa scan-  Last 03/04/15: Osteopenia.      CCS- Last 04/06/01:diverticulosis      Objective:     Vitals: There were no vitals taken for this visit.  There is no height or weight on file to calculate BMI.   Tobacco History  Smoking Status  . Never Smoker  Smokeless Tobacco  . Never Used     Counseling given: Not Answered   Past Medical History:  Diagnosis Date  . Breast CA ([place])    left breast-invasive ductal ca stage 1-stopped arimidex 2008  . Diverticulitis of colon   . Hiatal hernia   . Hyperlipidemia   . Hypertension   . Hypothyroidism   . Osteoarthritis   . Osteopenia    DEXA 7/02  . Syncope 2005   CT head (-), ECHO essent. neg, stress test (-), carotid u/s (-)   Past Surgical History:  Procedure Laterality Date  . BREAST LUMPECTOMY  04/2002   left  . TONSILLECTOMY AND ADENOIDECTOMY     Family History  Problem Relation Age of Onset  . Heart attack Brother 21  . Diabetes Mother   . Stroke Father 66  . Stroke Sister 47  . Colon cancer Neg Hx   . Breast cancer Neg Hx    History  Sexual Activity  . Sexual activity: Not on file    Outpatient Encounter Prescriptions as of 10/18/2016  Medication Sig  . acetaminophen (TYLENOL) 500 MG tablet Take 500 mg by mouth every 6 (six) hours as  needed.    Marland Kitchen aspirin 81 MG tablet Take 81 mg by mouth daily.    . Calcium Carbonate (CALTRATE 600 PO) Take by mouth.    . cyclobenzaprine (FLEXERIL) 5 MG tablet Take 1 tablet (5 mg total) by mouth at bedtime as needed for muscle spasms.  . fluticasone (FLONASE) 50 MCG/ACT nasal spray Place 2 sprays into both nostrils daily as needed for allergies or rhinitis.  . hydrochlorothiazide (HYDRODIURIL) 25 MG tablet Take 1 tablet (25 mg total) by mouth daily.  Marland Kitchen levothyroxine (SYNTHROID, LEVOTHROID) 75 MCG tablet TAKE 1 TABLET EVERY DAY BEFORE BREAKFAST  . potassium chloride (K-DUR) 10 MEQ tablet Take 1 tablet (10 mEq total) by mouth daily.  . rosuvastatin (CRESTOR) 5 MG tablet Take 1 tablet (5 mg total) by mouth daily.  Marland Kitchen VITAMIN D, CHOLECALCIFEROL, PO Take by mouth.     No facility-administered encounter medications on file as of 10/18/2016.     Activities of Daily Living In your present state of health, do you have any difficulty performing the following activities: 07/20/2016  Hearing? N  Vision? N  Difficulty concentrating or making decisions? N  Walking or climbing stairs? Y  Dressing or bathing? N  Doing errands, shopping? N  Some recent data might be hidden    Patient  Care Team: Colon Branch, MD as PCP - Cody, MD as Consulting Physician (Otolaryngology)    Assessment:    Physical assessment deferred to PCP.  Exercise Activities and Dietary recommendations   Diet (meal preparation, eat out, water intake, caffeinated beverages, dairy products, fruits and vegetables): {Desc; diets:16563} Breakfast: Lunch:  Dinner:      Goals    None     Fall Risk Fall Risk  07/20/2016 05/13/2015 06/10/2014 06/07/2013  Falls in the past year? No No No No   Depression Screen PHQ 2/9 Scores 07/20/2016 05/13/2015 06/10/2014 06/07/2013  PHQ - 2 Score 0 0 0 0     Cognitive Function        Immunization History  Administered Date(s) Administered  . H1N1 08/29/2008  .  Influenza Whole 05/12/2010  . Influenza, High Dose Seasonal PF 06/07/2013, 05/13/2015, 07/20/2016  . Influenza,inj,Quad PF,36+ Mos 06/10/2014  . Pneumococcal Conjugate-13 06/10/2014  . Pneumococcal Polysaccharide-23 07/10/2003, 02/21/2008  . Td 05/16/2007  . Zoster 12/13/2012   Screening Tests Health Maintenance  Topic Date Due  . TETANUS/TDAP  05/15/2017  . MAMMOGRAM  05/27/2018  . INFLUENZA VACCINE  Completed  . DEXA SCAN  Completed  . PNA vac Low Risk Adult  Completed      Plan:   *** During the course of the visit the patient was educated and counseled about the following appropriate screening and preventive services:   Vaccines to include Pneumoccal, Influenza, Hepatitis B, Td, Zostavax, HCV  Electrocardiogram  Cardiovascular Disease  Colorectal cancer screening  Bone density screening  Diabetes screening  Glaucoma screening  Mammography/PAP  Nutrition counseling   Patient Instructions (the written plan) was given to the patient.   Shela Nevin, [place]  10/14/2016

## 2016-10-18 ENCOUNTER — Ambulatory Visit: Payer: Commercial Managed Care - HMO | Admitting: *Deleted

## 2016-10-25 ENCOUNTER — Other Ambulatory Visit: Payer: Self-pay | Admitting: Internal Medicine

## 2016-11-24 ENCOUNTER — Ambulatory Visit: Payer: Medicare HMO | Admitting: Podiatry

## 2016-11-28 ENCOUNTER — Emergency Department (HOSPITAL_BASED_OUTPATIENT_CLINIC_OR_DEPARTMENT_OTHER): Payer: Medicare HMO

## 2016-11-28 ENCOUNTER — Observation Stay (HOSPITAL_BASED_OUTPATIENT_CLINIC_OR_DEPARTMENT_OTHER)
Admission: EM | Admit: 2016-11-28 | Discharge: 2016-11-30 | Disposition: A | Discharge status: Skilled Nursing Facility | Payer: Medicare HMO | Attending: Internal Medicine | Admitting: Internal Medicine

## 2016-11-28 ENCOUNTER — Encounter (HOSPITAL_BASED_OUTPATIENT_CLINIC_OR_DEPARTMENT_OTHER): Payer: Self-pay | Admitting: Emergency Medicine

## 2016-11-28 DIAGNOSIS — S32599A Other specified fracture of unspecified pubis, initial encounter for closed fracture: Secondary | ICD-10-CM | POA: Diagnosis present

## 2016-11-28 DIAGNOSIS — S161XXA Strain of muscle, fascia and tendon at neck level, initial encounter: Secondary | ICD-10-CM

## 2016-11-28 DIAGNOSIS — M542 Cervicalgia: Secondary | ICD-10-CM | POA: Diagnosis not present

## 2016-11-28 DIAGNOSIS — I10 Essential (primary) hypertension: Secondary | ICD-10-CM

## 2016-11-28 DIAGNOSIS — S199XXA Unspecified injury of neck, initial encounter: Secondary | ICD-10-CM | POA: Diagnosis not present

## 2016-11-28 DIAGNOSIS — S0990XA Unspecified injury of head, initial encounter: Secondary | ICD-10-CM | POA: Diagnosis not present

## 2016-11-28 DIAGNOSIS — E876 Hypokalemia: Secondary | ICD-10-CM | POA: Diagnosis present

## 2016-11-28 DIAGNOSIS — E039 Hypothyroidism, unspecified: Secondary | ICD-10-CM | POA: Diagnosis present

## 2016-11-28 DIAGNOSIS — R51 Headache: Secondary | ICD-10-CM | POA: Insufficient documentation

## 2016-11-28 DIAGNOSIS — W19XXXA Unspecified fall, initial encounter: Secondary | ICD-10-CM | POA: Diagnosis present

## 2016-11-28 DIAGNOSIS — S32511A Fracture of superior rim of right pubis, initial encounter for closed fracture: Secondary | ICD-10-CM | POA: Diagnosis not present

## 2016-11-28 DIAGNOSIS — S069X9A Unspecified intracranial injury with loss of consciousness of unspecified duration, initial encounter: Secondary | ICD-10-CM | POA: Diagnosis not present

## 2016-11-28 DIAGNOSIS — S32592A Other specified fracture of left pubis, initial encounter for closed fracture: Secondary | ICD-10-CM | POA: Diagnosis not present

## 2016-11-28 DIAGNOSIS — M25552 Pain in left hip: Secondary | ICD-10-CM | POA: Diagnosis present

## 2016-11-28 DIAGNOSIS — E785 Hyperlipidemia, unspecified: Secondary | ICD-10-CM | POA: Diagnosis not present

## 2016-11-28 DIAGNOSIS — W0110XA Fall on same level from slipping, tripping and stumbling with subsequent striking against unspecified object, initial encounter: Secondary | ICD-10-CM | POA: Diagnosis not present

## 2016-11-28 DIAGNOSIS — Z79899 Other long term (current) drug therapy: Secondary | ICD-10-CM | POA: Insufficient documentation

## 2016-11-28 DIAGNOSIS — S32512A Fracture of superior rim of left pubis, initial encounter for closed fracture: Secondary | ICD-10-CM | POA: Diagnosis not present

## 2016-11-28 DIAGNOSIS — Z853 Personal history of malignant neoplasm of breast: Secondary | ICD-10-CM | POA: Insufficient documentation

## 2016-11-28 DIAGNOSIS — M160 Bilateral primary osteoarthritis of hip: Secondary | ICD-10-CM | POA: Diagnosis not present

## 2016-11-28 DIAGNOSIS — Z7982 Long term (current) use of aspirin: Secondary | ICD-10-CM | POA: Insufficient documentation

## 2016-11-28 DIAGNOSIS — S32502A Unspecified fracture of left pubis, initial encounter for closed fracture: Secondary | ICD-10-CM | POA: Diagnosis present

## 2016-11-28 LAB — CBC WITH DIFFERENTIAL/PLATELET
Basophils Absolute: 0 10*3/uL (ref 0.0–0.1)
Basophils Relative: 0 %
EOS PCT: 1 %
Eosinophils Absolute: 0.1 10*3/uL (ref 0.0–0.7)
HCT: 42.5 % (ref 36.0–46.0)
Hemoglobin: 14.1 g/dL (ref 12.0–15.0)
LYMPHS PCT: 10 %
Lymphs Abs: 0.9 10*3/uL (ref 0.7–4.0)
MCH: 31.8 pg (ref 26.0–34.0)
MCHC: 33.2 g/dL (ref 30.0–36.0)
MCV: 95.7 fL (ref 78.0–100.0)
MONO ABS: 0.7 10*3/uL (ref 0.1–1.0)
MONOS PCT: 8 %
Neutro Abs: 7.6 10*3/uL (ref 1.7–7.7)
Neutrophils Relative %: 81 %
PLATELETS: 188 10*3/uL (ref 150–400)
RBC: 4.44 MIL/uL (ref 3.87–5.11)
RDW: 13.2 % (ref 11.5–15.5)
WBC: 9.2 10*3/uL (ref 4.0–10.5)

## 2016-11-28 LAB — BASIC METABOLIC PANEL
Anion gap: 8 (ref 5–15)
BUN: 14 mg/dL (ref 4–21)
BUN: 14 mg/dL (ref 6–20)
CALCIUM: 9.1 mg/dL (ref 8.9–10.3)
CO2: 31 mmol/L (ref 22–32)
Chloride: 96 mmol/L — ABNORMAL LOW (ref 101–111)
Creatinine, Ser: 0.75 mg/dL (ref 0.44–1.00)
Creatinine: 0.8 mg/dL (ref 0.5–1.1)
GFR calc Af Amer: >60 mL/min (ref >60)
GLUCOSE: 114 mg/dL
Glucose, Bld: 114 mg/dL — ABNORMAL HIGH (ref 65–99)
POTASSIUM: 3 mmol/L — AB (ref 3.5–5.1)
Potassium: 3 mmol/L — AB (ref 3.4–5.3)
SODIUM: 135 mmol/L — AB (ref 137–147)
Sodium: 135 mmol/L (ref 135–145)

## 2016-11-28 LAB — CBC AND DIFFERENTIAL
HCT: 43 % (ref 36–46)
HEMOGLOBIN: 14.1 g/dL (ref 12.0–16.0)
PLATELETS: 188 10*3/uL (ref 150–399)
WBC: 9.2 10*3/mL

## 2016-11-28 MED ORDER — ACETAMINOPHEN 325 MG PO TABS: 650.0000 mg | ORAL_TABLET | Freq: Four times a day (QID) | ORAL | Status: DC | PRN

## 2016-11-28 MED ORDER — MORPHINE SULFATE (PF) 2 MG/ML IV SOLN
2.0000 mg | Freq: Once | INTRAVENOUS | Status: AC
  Administered 2016-11-28: 2 mg via INTRAVENOUS
  Filled 2016-11-28: qty 1

## 2016-11-28 MED ORDER — POTASSIUM CHLORIDE 20 MEQ/15ML (10%) PO SOLN
40.0000 meq | Freq: Once | ORAL | Status: AC
Start: 2016-11-28 — End: 2016-11-28
  Administered 2016-11-28: 40 meq via ORAL
  Filled 2016-11-28: qty 30

## 2016-11-28 NOTE — ED Notes (Signed)
C collar applied to pt per MD.

## 2016-11-28 NOTE — ED Notes (Signed)
PT transferred to [place] via [facility].

## 2016-11-28 NOTE — Progress Notes (Signed)
This is a no charge note  Transfer from [facility] per Dr. Laverta Baltimore  83 year old lady with a past medical history of hypertension, hyperlipidemia, hypothyroidism, syncope, osteopenia, diverticulitis, breast cancer (lumpectomy 2003), who presents with fall. Patient states that she tripped when she tried to open the door and missed the handle at church, and fell, hitting cement at church. Reports that she hit the back of her head and left hip.   Found to have displaced fracture left pubic rami. CT-head negative. CT of C-spine negative for acute bony fracture, but showed. degenerative disc disease. WBC 9.2, potassium 3.0, creatinine normal, temperature normal, no tachycardia, oxygen sat 95% on room air. Patient is accepted to MedSurg bed as inpatient. Orthopedic surgeon will be consulted by EDP.  Ivor Costa, MD  [facility] Pager [phone]  If 7PM-7AM, please contact night-coverage [email or web address] Password [facility] 11/28/2016, 9:48 PM

## 2016-11-28 NOTE — ED Provider Notes (Signed)
Emergency Department Provider Note  By signing my name below, I, Avnee Patel, attest that this documentation has been prepared under the direction and in the presence of Margette Fast, MD  Electronically Signed: Delton [place], ED Scribe. 11/28/16. 7:55 PM.  I have reviewed the triage vital signs and the nursing notes.   HISTORY  Chief Complaint Fall   HPI Ariel Medina is a 83 y.o. female complaining of acute onset, moderate left hip pain s/p a fall which occurred PTA. Pt states she was reaching for the door knob, lost her balance and fell on her left side. Her pain is worse with palpation and with movement of her left lower extremity. She also reports left elbow pain and notes she hit the back of her head. She took 2 cyclobenzaprine with no relief. Pt denies chest pain, palpitations, lightheadedness or any other associated symptoms. She also denies blood thinner use. No other complaints noted at this time.   Past Medical History:  Diagnosis Date  . Breast CA ([place])    left breast-invasive ductal ca stage 1-stopped arimidex 2008  . Diverticulitis of colon   . Hiatal hernia   . Hyperlipidemia   . Hypertension   . Hypothyroidism   . Osteoarthritis   . Osteopenia    DEXA 7/02  . Syncope 2005   CT head (-), ECHO essent. neg, stress test (-), carotid u/s (-)    Patient Active Problem List   Diagnosis Date Noted  . Essential hypertension 11/29/2016  . Fall   . Closed fracture of left pubis ([place]) 11/28/2016  . Hypokalemia 11/28/2016  . PCP NOTES >>> 05/13/2015  . Allergic rhinitis 10/24/2014  . Annual physical exam 04/14/2011  . BACK PAIN, shoulder pain 12/11/2007  . Osteopenia 10/20/2007  . NEOPLASM, MALIGNANT, BREAST, HX OF 10/20/2007  . Hypothyroidism 01/30/2007  . Hyperlipidemia 01/30/2007  . HTN and mild edema 01/30/2007  . OSTEOARTHRITIS 01/30/2007    Past Surgical History:  Procedure Laterality Date  . BREAST LUMPECTOMY  04/2002   left  . TONSILLECTOMY AND  ADENOIDECTOMY        Allergies Ibuprofen  Family History  Problem Relation Age of Onset  . Heart attack Brother 35  . Diabetes Mother   . Stroke Father 56  . Stroke Sister 51  . Colon cancer Neg Hx   . Breast cancer Neg Hx     Social History Social History  Substance Use Topics  . Smoking status: Never Smoker  . Smokeless tobacco: Never Used  . Alcohol use No    Review of Systems  Constitutional: No fever/chills Eyes: No visual changes. ENT: No sore throat. Cardiovascular: Denies chest pain. Respiratory: Denies shortness of breath. Gastrointestinal: No abdominal pain.  No nausea, no vomiting.  No diarrhea.  No constipation. Genitourinary: Negative for dysuria. Musculoskeletal: Negative for back pain. Positive left hip pain. Positive neck and posterior head pain with some swelling.  Skin: Negative for rash. Abrasion to the left elbow.  Neurological: Negative for headaches, focal weakness or numbness.  10-point ROS otherwise negative.  ____________________________________________   PHYSICAL EXAM:  VITAL SIGNS: ED Triage Vitals  Enc Vitals Group     BP 11/28/16 1921 (!) 162/100     Pulse Rate 11/28/16 1921 94     Resp 11/28/16 1921 (!) 22     Temp 11/28/16 1921 98.1 F (36.7 C)     Temp Source 11/28/16 1921 Oral     SpO2 11/28/16 1921 95 %  Weight 11/28/16 1921 140 lb (63.5 kg)     Height 11/28/16 1921 '5\' 3"'$  (1.6 m)     Pain Score 11/28/16 1924 7   Constitutional: Alert and oriented. Well appearing and in no acute distress. Eyes: Conjunctivae are normal. PERRL.  Head: Mild hematoma to the posterior scalp. No laceration or abrasion.  Nose: No congestion/rhinnorhea. Mouth/Throat: Mucous membranes are moist.  Oropharynx non-erythematous. Neck: No stridor. Mild diffuse cervical spine tenderness to palpation. Cardiovascular: Normal rate, regular rhythm. Good peripheral circulation. Grossly normal heart sounds.   Respiratory: Normal respiratory effort.  No  retractions. Lungs CTAB. Gastrointestinal: Soft and nontender. No distention.  Musculoskeletal: Positive pain with passive ROM of the left hip. No crepitus. No shortening or abnormal rotation. No gross deformities of extremities. Neurologic:  Normal speech and language. No gross focal neurologic deficits are appreciated.  Skin:  Skin is warm and dry. No rash noted. Skin tare to the left elbow.    ____________________________________________   LABS (all labs ordered are listed, but only abnormal results are displayed)  Labs Reviewed  BASIC METABOLIC PANEL - Abnormal; Notable for the following:       Result Value   Potassium 3.0 (*)    Chloride 96 (*)    Glucose, Bld 114 (*)    All other components within normal limits  BASIC METABOLIC PANEL - Abnormal; Notable for the following:    Potassium 3.4 (*)    Chloride 98 (*)    Glucose, Bld 112 (*)    Calcium 8.8 (*)    All other components within normal limits  CBC WITH DIFFERENTIAL/PLATELET  CBC   ____________________________________________  RADIOLOGY  Ct Head Wo Contrast  Result Date: 11/28/2016 CLINICAL DATA:  Loss of balance with fall and left-sided neck pain. EXAM: CT HEAD WITHOUT CONTRAST CT CERVICAL SPINE WITHOUT CONTRAST TECHNIQUE: Multidetector CT imaging of the head and cervical spine was performed following the standard protocol without intravenous contrast. Multiplanar CT image reconstructions of the cervical spine were also generated. COMPARISON:  Head CT 09/23/2003 FINDINGS: CT HEAD FINDINGS Brain: No mass lesion, intraparenchymal hemorrhage or extra-axial collection. No evidence of acute cortical infarct. There is periventricular hypoattenuation compatible with chronic microvascular disease. Vascular: No hyperdense vessel or unexpected calcification. Skull: Normal visualized skull base, calvarium and extracranial soft tissues. Sinuses/Orbits: No sinus fluid levels or advanced mucosal thickening. No mastoid effusion. Normal  orbits. CT CERVICAL SPINE FINDINGS Alignment: No acute static subluxation. Trace grade 1 anterolisthesis at C2-C3 and C4-C5 is unchanged. The facets are aligned. Occipital condyles are normally positioned. Skull base and vertebrae: No acute fracture. Soft tissues and spinal canal: No prevertebral fluid or swelling. No visible canal hematoma. Disc levels: There is severe right C4-C5 facet hypertrophy and moderate bilateral C2-C3 facet hypertrophy. No bony spinal canal stenosis. Upper chest: No pneumothorax, pulmonary nodule or pleural effusion. Other: Normal visualized paraspinal cervical soft tissues. IMPRESSION: 1. No acute intracranial abnormality. 2. Chronic microvascular ischemia. 3. No acute fracture or acute static subluxation of the cervical spine. 4. Multilevel facet arthrosis resulting and degenerative grade 1 anterolisthesis at C2-C3 and C4-C5. Electronically Signed   By: Ulyses Jarred M.D.   On: 11/28/2016 20:50   Ct Cervical Spine Wo Contrast  Result Date: 11/28/2016 CLINICAL DATA:  Loss of balance with fall and left-sided neck pain. EXAM: CT HEAD WITHOUT CONTRAST CT CERVICAL SPINE WITHOUT CONTRAST TECHNIQUE: Multidetector CT imaging of the head and cervical spine was performed following the standard protocol without intravenous contrast. Multiplanar CT image reconstructions  of the cervical spine were also generated. COMPARISON:  Head CT 09/23/2003 FINDINGS: CT HEAD FINDINGS Brain: No mass lesion, intraparenchymal hemorrhage or extra-axial collection. No evidence of acute cortical infarct. There is periventricular hypoattenuation compatible with chronic microvascular disease. Vascular: No hyperdense vessel or unexpected calcification. Skull: Normal visualized skull base, calvarium and extracranial soft tissues. Sinuses/Orbits: No sinus fluid levels or advanced mucosal thickening. No mastoid effusion. Normal orbits. CT CERVICAL SPINE FINDINGS Alignment: No acute static subluxation. Trace grade 1  anterolisthesis at C2-C3 and C4-C5 is unchanged. The facets are aligned. Occipital condyles are normally positioned. Skull base and vertebrae: No acute fracture. Soft tissues and spinal canal: No prevertebral fluid or swelling. No visible canal hematoma. Disc levels: There is severe right C4-C5 facet hypertrophy and moderate bilateral C2-C3 facet hypertrophy. No bony spinal canal stenosis. Upper chest: No pneumothorax, pulmonary nodule or pleural effusion. Other: Normal visualized paraspinal cervical soft tissues. IMPRESSION: 1. No acute intracranial abnormality. 2. Chronic microvascular ischemia. 3. No acute fracture or acute static subluxation of the cervical spine. 4. Multilevel facet arthrosis resulting and degenerative grade 1 anterolisthesis at C2-C3 and C4-C5. Electronically Signed   By: Ulyses Jarred M.D.   On: 11/28/2016 20:50   Dg Hip Unilat W Or Wo Pelvis 2-3 Views Left  Result Date: 11/28/2016 CLINICAL DATA:  83 year old female with history of trauma from a fall earlier this evening complaining of left hip pain. EXAM: DG HIP (WITH OR WITHOUT PELVIS) 2-3V LEFT COMPARISON:  No priors. FINDINGS: Multiple views of the lung bony pelvis in the left hip demonstrate acute minimally displaced fractures of the left superior and inferior pubic rami. Remaining portions of the pelvis appear grossly intact. Visualize left proximal femur is intact. Femoral heads appear located. Severe joint space narrowing, subchondral sclerosis, subchondral cyst formation and osteophyte formation is noted in the hip joints bilaterally, compatible with advanced osteoarthritis. IMPRESSION: 1. Acute minimally displaced fractures of the left superior and inferior pubic rami. 2. Severe bilateral hip joint osteoarthritis, as above. Electronically Signed   By: Vinnie Langton M.D.   On: 11/28/2016 20:33    ____________________________________________   PROCEDURES  Procedure(s) performed:    Procedures  None ____________________________________________   INITIAL IMPRESSION / ASSESSMENT AND PLAN / ED COURSE  Pertinent labs & imaging results that were available during my care of the patient were reviewed by me and considered in my medical decision making (see chart for details).  Patient presents to the ED with pain after a mechanical fall. She lives at home with an elderly, sick husband and some family supervision. She has been unable to walk since the incident. Plan for plain films of the left hip as well as CT of the head and c-spine. C-collar applied. No pre-syncope symptoms prior to fall.   09:15 PM Plain films with left pubic rami fractures. Updated family and patient at bedside. Provided additional pain medication. Unable to ambulate. Will require admission for pain control and PT/OT treatment/evaluation.   09:42 PM Discussed patient's case with hospitalist, Dr. Blaine Hamper. Patient and family (if present) updated with plan. Care transferred to hospitalist service.  I reviewed all nursing notes, vitals, pertinent old records, EKGs, labs, imaging (as available).  09:44 PM Spoke with Dr. Stann Mainland with ortho. Advises weight bearing as tolerated, pain control and PT evaluation. No surgical mgmt required. Consult as needed.  ____________________________________________  FINAL CLINICAL IMPRESSION(S) / ED DIAGNOSES  Final diagnoses:  Fall, initial encounter  Closed fracture of multiple pubic rami, left, initial encounter ([place])  Injury of head, initial encounter  Strain of neck muscle, initial encounter     MEDICATIONS GIVEN DURING THIS VISIT:  Medications  acetaminophen (TYLENOL) tablet 650 mg (not administered)  morphine 4 MG/ML injection 1 mg (not administered)  levothyroxine (SYNTHROID, LEVOTHROID) tablet 75 mcg (75 mcg Oral Given 11/29/16 0742)  rosuvastatin (CRESTOR) tablet 5 mg (not administered)  fluticasone (FLONASE) 50 MCG/ACT nasal spray 2 spray (not  administered)  hydrochlorothiazide (HYDRODIURIL) tablet 25 mg (25 mg Oral Given 11/29/16 1011)  potassium chloride (K-DUR) CR tablet 10 mEq (10 mEq Oral Given 11/29/16 1010)  cyclobenzaprine (FLEXERIL) tablet 5 mg (5 mg Oral Given 11/29/16 0031)  aspirin chewable tablet 81 mg (81 mg Oral Given 11/29/16 1010)  calcium carbonate (OS-CAL - dosed in mg of elemental calcium) tablet 1,250 mg (1,250 mg Oral Given 11/29/16 0742)  cholecalciferol (VITAMIN D) tablet 1,000 Units (1,000 Units Oral Given 11/29/16 1010)  oxyCODONE-acetaminophen (PERCOCET/ROXICET) 5-325 MG per tablet 1 tablet (1 tablet Oral Given 11/29/16 1016)  ondansetron (ZOFRAN) injection 4 mg (not administered)  enoxaparin (LOVENOX) injection 40 mg (40 mg Subcutaneous Given 11/29/16 0031)  polyethylene glycol (MIRALAX / GLYCOLAX) packet 17 g (not administered)  zolpidem (AMBIEN) tablet 5 mg (not administered)  multivitamin (PROSIGHT) tablet 1 tablet (1 tablet Oral Given 11/29/16 1010)  hydrALAZINE (APRESOLINE) injection 5 mg (not administered)  morphine 2 MG/ML injection 2 mg (2 mg Intravenous Given 11/28/16 2130)  potassium chloride 20 MEQ/15ML (10%) solution 40 mEq (40 mEq Oral Given 11/28/16 2207)  morphine 2 MG/ML injection 2 mg (2 mg Intravenous Given 11/28/16 2305)  potassium chloride SA (K-DUR,KLOR-CON) CR tablet 40 mEq (40 mEq Oral Given 11/29/16 0752)     NEW OUTPATIENT MEDICATIONS STARTED DURING THIS VISIT:  None   Note:  This document was prepared using Dragon voice recognition software and may include unintentional dictation errors.  Nanda Quinton, MD Emergency Medicine  I personally performed the services described in this documentation, which was scribed in my presence. The recorded information has been reviewed and is accurate.      Margette Fast, MD 11/29/16 1153

## 2016-11-28 NOTE — ED Triage Notes (Signed)
Patient states that she tripped and fell hitting cement at [place]. Reports that she hit the back of her head, and left hip. Patient reports that her hip is that thing that hurts the most

## 2016-11-29 ENCOUNTER — Encounter (HOSPITAL_COMMUNITY): Payer: Self-pay

## 2016-11-29 DIAGNOSIS — I10 Essential (primary) hypertension: Secondary | ICD-10-CM

## 2016-11-29 DIAGNOSIS — W19XXXA Unspecified fall, initial encounter: Secondary | ICD-10-CM | POA: Diagnosis present

## 2016-11-29 DIAGNOSIS — S32592A Other specified fracture of left pubis, initial encounter for closed fracture: Secondary | ICD-10-CM | POA: Diagnosis not present

## 2016-11-29 DIAGNOSIS — S32599A Other specified fracture of unspecified pubis, initial encounter for closed fracture: Secondary | ICD-10-CM | POA: Diagnosis present

## 2016-11-29 DIAGNOSIS — E785 Hyperlipidemia, unspecified: Secondary | ICD-10-CM

## 2016-11-29 DIAGNOSIS — E039 Hypothyroidism, unspecified: Secondary | ICD-10-CM

## 2016-11-29 LAB — BASIC METABOLIC PANEL
ANION GAP: 7 (ref 5–15)
BUN: 11 mg/dL (ref 4–21)
BUN: 11 mg/dL (ref 6–20)
CALCIUM: 8.8 mg/dL — AB (ref 8.9–10.3)
CO2: 30 mmol/L (ref 22–32)
CREATININE: 0.6 mg/dL (ref 0.5–1.1)
CREATININE: 0.63 mg/dL (ref 0.44–1.00)
Chloride: 98 mmol/L — ABNORMAL LOW (ref 101–111)
GFR calc Af Amer: >60 mL/min (ref >60)
GFR calc non Af Amer: >60 mL/min (ref >60)
GLUCOSE: 112 mg/dL
GLUCOSE: 112 mg/dL — AB (ref 65–99)
Potassium: 3.4 mmol/L (ref 3.4–5.3)
Potassium: 3.4 mmol/L — ABNORMAL LOW (ref 3.5–5.1)
Sodium: 135 mmol/L (ref 135–145)
Sodium: 135 mmol/L — AB (ref 137–147)

## 2016-11-29 LAB — CBC AND DIFFERENTIAL: WBC: 7.1 10*3/mL

## 2016-11-29 LAB — CBC
HEMATOCRIT: 39.6 % (ref 36.0–46.0)
Hemoglobin: 13.2 g/dL (ref 12.0–15.0)
MCH: 31.8 pg (ref 26.0–34.0)
MCHC: 33.3 g/dL (ref 30.0–36.0)
MCV: 95.4 fL (ref 78.0–100.0)
PLATELETS: 187 10*3/uL (ref 150–400)
RBC: 4.15 MIL/uL (ref 3.87–5.11)
RDW: 13.4 % (ref 11.5–15.5)
WBC: 7.1 10*3/uL (ref 4.0–10.5)

## 2016-11-29 MED ORDER — ROSUVASTATIN CALCIUM 5 MG PO TABS
5.0000 mg | ORAL_TABLET | Freq: Every day | ORAL | Status: DC
  Administered 2016-11-29 – 2016-11-30 (×2): 5 mg via ORAL
  Filled 2016-11-29 (×2): qty 1

## 2016-11-29 MED ORDER — PROSIGHT PO TABS
1.0000 | ORAL_TABLET | Freq: Two times a day (BID) | ORAL | Status: DC
  Administered 2016-11-29 – 2016-11-30 (×3): 1 via ORAL
  Filled 2016-11-29 (×3): qty 1

## 2016-11-29 MED ORDER — LEVOTHYROXINE SODIUM 75 MCG PO TABS
75.0000 ug | ORAL_TABLET | Freq: Every day | ORAL | Status: DC
  Administered 2016-11-29 – 2016-11-30 (×2): 75 ug via ORAL
  Filled 2016-11-29 (×2): qty 1

## 2016-11-29 MED ORDER — CYCLOBENZAPRINE HCL 5 MG PO TABS
5.0000 mg | ORAL_TABLET | Freq: Every evening | ORAL | Status: DC | PRN
  Administered 2016-11-29 (×2): 5 mg via ORAL
  Filled 2016-11-29 (×2): qty 1

## 2016-11-29 MED ORDER — METHOCARBAMOL 500 MG PO TABS: 500.0000 mg | ORAL_TABLET | Freq: Three times a day (TID) | ORAL | Status: DC | PRN

## 2016-11-29 MED ORDER — HYDRALAZINE HCL 20 MG/ML IJ SOLN: 5.0000 mg | INTRAMUSCULAR | Status: DC | PRN

## 2016-11-29 MED ORDER — ONDANSETRON HCL 4 MG/2ML IJ SOLN: 4.0000 mg | Freq: Three times a day (TID) | INTRAMUSCULAR | Status: DC | PRN

## 2016-11-29 MED ORDER — CALCIUM CARBONATE 1250 (500 CA) MG PO TABS
1250.0000 mg | ORAL_TABLET | Freq: Every day | ORAL | Status: DC
  Administered 2016-11-29 – 2016-11-30 (×2): 1250 mg via ORAL
  Filled 2016-11-29 (×2): qty 1

## 2016-11-29 MED ORDER — MORPHINE SULFATE (PF) 4 MG/ML IV SOLN: 1.0000 mg | INTRAVENOUS | Status: DC | PRN

## 2016-11-29 MED ORDER — OXYCODONE-ACETAMINOPHEN 5-325 MG PO TABS
1.0000 | ORAL_TABLET | ORAL | Status: DC | PRN
  Administered 2016-11-29 – 2016-11-30 (×9): 1 via ORAL
  Filled 2016-11-29 (×9): qty 1

## 2016-11-29 MED ORDER — POLYETHYLENE GLYCOL 3350 17 G PO PACK: 17.0000 g | PACK | Freq: Every day | ORAL | Status: DC | PRN

## 2016-11-29 MED ORDER — ZOLPIDEM TARTRATE 5 MG PO TABS: 5.0000 mg | ORAL_TABLET | Freq: Every evening | ORAL | Status: DC | PRN

## 2016-11-29 MED ORDER — POTASSIUM CHLORIDE ER 10 MEQ PO TBCR
10.0000 meq | EXTENDED_RELEASE_TABLET | Freq: Every day | ORAL | Status: DC
  Administered 2016-11-29 – 2016-11-30 (×2): 10 meq via ORAL
  Filled 2016-11-29 (×4): qty 1

## 2016-11-29 MED ORDER — ENOXAPARIN SODIUM 40 MG/0.4ML ~~LOC~~ SOLN
40.0000 mg | Freq: Every day | SUBCUTANEOUS | Status: DC
  Administered 2016-11-29: 40 mg via SUBCUTANEOUS
  Filled 2016-11-29: qty 0.4

## 2016-11-29 MED ORDER — HYDROCHLOROTHIAZIDE 25 MG PO TABS
25.0000 mg | ORAL_TABLET | Freq: Every day | ORAL | Status: DC
  Administered 2016-11-29 – 2016-11-30 (×2): 25 mg via ORAL
  Filled 2016-11-29 (×2): qty 1

## 2016-11-29 MED ORDER — ASPIRIN 81 MG PO CHEW
81.0000 mg | CHEWABLE_TABLET | Freq: Every day | ORAL | Status: DC
  Administered 2016-11-29 – 2016-11-30 (×2): 81 mg via ORAL
  Filled 2016-11-29 (×2): qty 1

## 2016-11-29 MED ORDER — ALUM & MAG HYDROXIDE-SIMETH 200-200-20 MG/5ML PO SUSP: 15.0000 mL | Freq: Four times a day (QID) | ORAL | Status: DC | PRN

## 2016-11-29 MED ORDER — POTASSIUM CHLORIDE CRYS ER 20 MEQ PO TBCR
40.0000 meq | EXTENDED_RELEASE_TABLET | Freq: Once | ORAL | Status: AC
  Administered 2016-11-29: 40 meq via ORAL
  Filled 2016-11-29: qty 2

## 2016-11-29 MED ORDER — FLUTICASONE PROPIONATE 50 MCG/ACT NA SUSP: 2.0000 | Freq: Every day | NASAL | Status: DC | PRN

## 2016-11-29 MED ORDER — VITAMIN D 1000 UNITS PO TABS
1000.0000 [IU] | ORAL_TABLET | Freq: Every day | ORAL | Status: DC
  Administered 2016-11-29 – 2016-11-30 (×2): 1000 [IU] via ORAL
  Filled 2016-11-29 (×2): qty 1

## 2016-11-29 MED ORDER — ENOXAPARIN SODIUM 40 MG/0.4ML ~~LOC~~ SOLN
40.0000 mg | SUBCUTANEOUS | Status: DC
  Administered 2016-11-29: 40 mg via SUBCUTANEOUS
  Filled 2016-11-29: qty 0.4

## 2016-11-29 NOTE — Progress Notes (Signed)
OT Cancellation Note  Patient Details Name: Ariel Medina MRN: [id number] DOB: 1927/11/25   Cancelled Treatment:    Reason Eval/Treat Not Completed: Other (comment)  Noted pt on bedrest- will check back later in day or next day for increased activity.    Betsy Pries 11/29/2016, 10:10 AM

## 2016-11-29 NOTE — Progress Notes (Signed)
  PROGRESS NOTE    JERMANY SUNDELL  JTT:[id number] DOB: [birth year] DOA: 11/28/2016 PCP: Kathlene November, MD   Chief Complaint  Patient presents with  . Fall    Brief Narrative:  HPI On 11/29/2016 by Dr. Ivor Costa CHESTINE BELKNAP is a 83 y.o. female with medical history significant of hypertension, hyperlipidemia, hypothyroidism, syncope, osteopenia, diverticulitis, breast cancer (s/p of lumpectomy 2003), who presents with fall.  Patient states that she tripped when she tried to open the door and missed handle at church, and fell,hitting cement at church. Reports that she hit the back of her head and left hip. She developed a severe pain in left hip, which is constant, 7 out of 10 in severity, nonradiating. No numbness or weakness in legs. Patient denies LOC. No chest pain, shortness rest, cough, nausea, vomiting, diarrhea, abdominal pain, symptoms of UTI or unilateral weakness.  Assessment & Plan   Patient admitted earlier today by Dr. Blaine Hamper. See H&P for details.  Closed left pubic ramus fracture -Status post fall -X-ray left hip and pelvis showed acute minimally displaced fractures of the left superior and inferior pubic rami. Severe bilateral hip joint osteoarthritis -CT head and cervical spine: Lacunar cranial normality, no acute fracture or acute static subluxation of the cervical spine. -Continue pain control -PT consulted and recommended SNF -OT consulted, pending evaluation -EDP spoke with Dr. Stann Mainland, orthopedic, who advised weightbearing as tolerated, pain control and PT evaluation. No surgical management required.  Hypothyroidism -Continue Synthroid  Hyperlipidemia -Continue statin  Hypokalemia -Potassium replaced, continue to monitor BMP  Essential hypertension  -Continue HCTZ -Continue IV hydralazine  DVT Prophylaxis  lovenox  Code Status: Full  Family Communication: None at bedside  Disposition Plan: observation, pending OT eval. Pending SNF  placement.  Consultants Orthopedic surgery, Dr. Stann Mainland, via phone by EDP  Procedures  None  Antibiotics   Anti-infectives    None      LOS: 1 day   Time Spent in minutes   30 minutes  Vickie Ponds D.O. on 11/29/2016 at 1:07 PM  Between 7am to 7pm - Pager - [phone]  After 7pm go to [email or web address] - password [email or web address]  And look for the night coverage person covering for me after hours  [facility] Office  [phone]

## 2016-11-29 NOTE — Progress Notes (Signed)
Date:  November 29, 2016 Chart reviewed for concurrent status and case management needs. Will continue to follow patient progress. Discharge Planning: following for needs Expected discharge date: [id number] Rhonda Davis, BSN, [place], [place]

## 2016-11-29 NOTE — H&P (Signed)
History and Physical    Ariel Medina:[id number] DOB: 11-29-1927 DOA: 11/28/2016  Referring MD/NP/PA:   PCP: Kathlene November, MD   Patient coming from:  The patient is coming from home.  At baseline, pt is partially dependent for most of ADL.   Chief Complaint: fall, left hip pain  HPI: Ariel Medina is a 83 y.o. female with medical history significant of hypertension, hyperlipidemia, hypothyroidism, syncope, osteopenia, diverticulitis, breast cancer (s/p of lumpectomy 2003), who presents with fall.   Patient states that she tripped when she tried to open the door and missed handle at church, and fell, hitting cement at church. Reports that she hit the back of her head and left hip. She developed a severe pain in left hip, which is constant, 7 out of 10 in severity, nonradiating. No numbness or weakness in legs. Patient denies LOC. No chest pain, shortness rest, cough, nausea, vomiting, diarrhea, abdominal pain, symptoms of UTI or unilateral weakness.  ED Course: pt was found to have displaced fracture of left pubic rami. CT-head negative. CT of C-spine negative for acute bony fracture, but showed degenerative disc disease. WBC 9.2, potassium 3.0, creatinine normal, temperature normal, no tachycardia, oxygen sat 95% on room air. Patient is admitted to [facility] bed as inpatient. Orthopedic surgeon, Dr. Stann Mainland was consulted by EDP.  Review of Systems:   General: no fevers, chills, no changes in body weight, has fatigue HEENT: no blurry vision, hearing changes or sore throat Respiratory: no dyspnea, coughing, wheezing CV: no chest pain, no palpitations GI: no nausea, vomiting, abdominal pain, diarrhea, constipation GU: no dysuria, burning on urination, increased urinary frequency, hematuria  Ext: no leg edema Neuro: no unilateral weakness, numbness, or tingling, no vision change or hearing loss Skin: no rash, no skin tear. MSK: has left hip pain. Heme: No easy bruising.  Travel history:  No recent long distant travel.  Allergy:  Allergies  Allergen Reactions  . Ibuprofen     REACTION: bleed    Past Medical History:  Diagnosis Date  . Breast CA ([place])    left breast-invasive ductal ca stage 1-stopped arimidex 2008  . Diverticulitis of colon   . Hiatal hernia   . Hyperlipidemia   . Hypertension   . Hypothyroidism   . Osteoarthritis   . Osteopenia    DEXA 7/02  . Syncope 2005   CT head (-), ECHO essent. neg, stress test (-), carotid u/s (-)    Past Surgical History:  Procedure Laterality Date  . BREAST LUMPECTOMY  04/2002   left  . TONSILLECTOMY AND ADENOIDECTOMY      Social History:  reports that she has never smoked. She has never used smokeless tobacco. She reports that she does not drink alcohol or use drugs.  Family History:  Family History  Problem Relation Age of Onset  . Heart attack Brother 21  . Diabetes Mother   . Stroke Father 31  . Stroke Sister 58  . Colon cancer Neg Hx   . Breast cancer Neg Hx      Prior to Admission medications   Medication Sig Start Date End Date Taking? Authorizing Provider  acetaminophen (TYLENOL) 500 MG tablet Take 500 mg by mouth every 6 (six) hours as needed.      Historical Provider, MD  aspirin 81 MG tablet Take 81 mg by mouth daily.      Historical Provider, MD  Calcium Carbonate (CALTRATE 600 PO) Take by mouth.      Historical Provider, MD  cyclobenzaprine (FLEXERIL) 5 MG tablet Take 1 tablet (5 mg total) by mouth at bedtime as needed for muscle spasms. 08/13/15   Colon Branch, MD  fluticasone [facility]) 50 MCG/ACT nasal spray Place 2 sprays into both nostrils daily as needed for allergies or rhinitis. 07/28/16   Colon Branch, MD  hydrochlorothiazide (HYDRODIURIL) 25 MG tablet Take 1 tablet (25 mg total) by mouth daily. 07/28/16   Colon Branch, MD  levothyroxine (SYNTHROID, LEVOTHROID) 75 MCG tablet Take 1 tablet (75 mcg total) by mouth daily before breakfast. 10/25/16   Colon Branch, MD  potassium chloride (K-DUR) 10  MEQ tablet Take 1 tablet (10 mEq total) by mouth daily. 02/12/16   Colon Branch, MD  rosuvastatin (CRESTOR) 5 MG tablet Take 1 tablet (5 mg total) by mouth daily. 08/31/16   Colon Branch, MD  VITAMIN D, CHOLECALCIFEROL, PO Take by mouth.      Historical Provider, MD    Physical Exam: Vitals:   11/28/16 1921 11/28/16 2138 11/28/16 2351  BP: (!) 162/100 (!) 141/87 137/73  Pulse: 94 80 88  Resp: (!) '22 18 20  '$ Temp: 98.1 F (36.7 C)  98.4 F (36.9 C)  TempSrc: Oral  Oral  SpO2: 95% 91% 95%  Weight: 63.5 kg (140 lb)    Height: '5\' 3"'$  (1.6 m)     General: Not in acute distress HEENT:       Eyes: PERRL, EOMI, no scleral icterus.       ENT: No discharge from the ears and nose, no pharynx injection, no tonsillar enlargement.        Neck: No JVD, no bruit, no mass felt. Heme: No neck lymph node enlargement. Cardiac: S1/S2, RRR, No murmurs, No gallops or rubs. Respiratory: No rales, wheezing, rhonchi or rubs. GI: Soft, nondistended, nontender, no rebound pain, no organomegaly, BS present. GU: No hematuria Ext: No pitting leg edema bilaterally. 2+DP/PT pulse bilaterally. Musculoskeletal: No joint deformities. Tender on left hip. Skin: No rashes.  Neuro: Alert, oriented X3, cranial nerves II-XII grossly intact, moves all extremities normally. Psych: Patient is not psychotic, no suicidal or hemocidal ideation.  Labs on Admission: I have personally reviewed following labs and imaging studies  CBC:  Recent Labs Lab 11/28/16 2040  WBC 9.2  NEUTROABS 7.6  HGB 14.1  HCT 42.5  MCV 95.7  PLT 742   Basic Metabolic Panel:  Recent Labs Lab 11/28/16 2040  NA 135  K 3.0*  CL 96*  CO2 31  GLUCOSE 114*  BUN 14  CREATININE 0.75  CALCIUM 9.1   GFR: Estimated Creatinine Clearance: 45.3 mL/min (by C-G formula based on SCr of 0.75 mg/dL). Liver Function Tests: No results for input(s): AST, ALT, ALKPHOS, BILITOT, PROT, ALBUMIN in the last 168 hours. No results for input(s): LIPASE, AMYLASE  in the last 168 hours. No results for input(s): AMMONIA in the last 168 hours. Coagulation Profile: No results for input(s): INR, PROTIME in the last 168 hours. Cardiac Enzymes: No results for input(s): CKTOTAL, CKMB, CKMBINDEX, TROPONINI in the last 168 hours. BNP (last 3 results) No results for input(s): PROBNP in the last 8760 hours. HbA1C: No results for input(s): HGBA1C in the last 72 hours. CBG: No results for input(s): GLUCAP in the last 168 hours. Lipid Profile: No results for input(s): CHOL, HDL, LDLCALC, TRIG, CHOLHDL, LDLDIRECT in the last 72 hours. Thyroid Function Tests: No results for input(s): TSH, T4TOTAL, FREET4, T3FREE, THYROIDAB in the last 72 hours. Anemia Panel: No results  for input(s): VITAMINB12, FOLATE, FERRITIN, TIBC, IRON, RETICCTPCT in the last 72 hours. Urine analysis:    Component Value Date/Time   BILIRUBINUR 1 12/26/2014 1628   PROTEINUR 100 12/26/2014 1628   UROBILINOGEN 0.2 12/26/2014 1628   NITRITE Neg 12/26/2014 1628   LEUKOCYTESUR Trace 12/26/2014 1628   Sepsis Labs: '@LABRCNTIP'$ (procalcitonin:4,lacticidven:4) )No results found for this or any previous visit (from the past 240 hour(s)).   Radiological Exams on Admission: Ct Head Wo Contrast  Result Date: 11/28/2016 CLINICAL DATA:  Loss of balance with fall and left-sided neck pain. EXAM: CT HEAD WITHOUT CONTRAST CT CERVICAL SPINE WITHOUT CONTRAST TECHNIQUE: Multidetector CT imaging of the head and cervical spine was performed following the standard protocol without intravenous contrast. Multiplanar CT image reconstructions of the cervical spine were also generated. COMPARISON:  Head CT 09/23/2003 FINDINGS: CT HEAD FINDINGS Brain: No mass lesion, intraparenchymal hemorrhage or extra-axial collection. No evidence of acute cortical infarct. There is periventricular hypoattenuation compatible with chronic microvascular disease. Vascular: No hyperdense vessel or unexpected calcification. Skull: Normal  visualized skull base, calvarium and extracranial soft tissues. Sinuses/Orbits: No sinus fluid levels or advanced mucosal thickening. No mastoid effusion. Normal orbits. CT CERVICAL SPINE FINDINGS Alignment: No acute static subluxation. Trace grade 1 anterolisthesis at C2-C3 and C4-C5 is unchanged. The facets are aligned. Occipital condyles are normally positioned. Skull base and vertebrae: No acute fracture. Soft tissues and spinal canal: No prevertebral fluid or swelling. No visible canal hematoma. Disc levels: There is severe right C4-C5 facet hypertrophy and moderate bilateral C2-C3 facet hypertrophy. No bony spinal canal stenosis. Upper chest: No pneumothorax, pulmonary nodule or pleural effusion. Other: Normal visualized paraspinal cervical soft tissues. IMPRESSION: 1. No acute intracranial abnormality. 2. Chronic microvascular ischemia. 3. No acute fracture or acute static subluxation of the cervical spine. 4. Multilevel facet arthrosis resulting and degenerative grade 1 anterolisthesis at C2-C3 and C4-C5. Electronically Signed   By: Ulyses Jarred M.D.   On: 11/28/2016 20:50   Ct Cervical Spine Wo Contrast  Result Date: 11/28/2016 CLINICAL DATA:  Loss of balance with fall and left-sided neck pain. EXAM: CT HEAD WITHOUT CONTRAST CT CERVICAL SPINE WITHOUT CONTRAST TECHNIQUE: Multidetector CT imaging of the head and cervical spine was performed following the standard protocol without intravenous contrast. Multiplanar CT image reconstructions of the cervical spine were also generated. COMPARISON:  Head CT 09/23/2003 FINDINGS: CT HEAD FINDINGS Brain: No mass lesion, intraparenchymal hemorrhage or extra-axial collection. No evidence of acute cortical infarct. There is periventricular hypoattenuation compatible with chronic microvascular disease. Vascular: No hyperdense vessel or unexpected calcification. Skull: Normal visualized skull base, calvarium and extracranial soft tissues. Sinuses/Orbits: No sinus fluid  levels or advanced mucosal thickening. No mastoid effusion. Normal orbits. CT CERVICAL SPINE FINDINGS Alignment: No acute static subluxation. Trace grade 1 anterolisthesis at C2-C3 and C4-C5 is unchanged. The facets are aligned. Occipital condyles are normally positioned. Skull base and vertebrae: No acute fracture. Soft tissues and spinal canal: No prevertebral fluid or swelling. No visible canal hematoma. Disc levels: There is severe right C4-C5 facet hypertrophy and moderate bilateral C2-C3 facet hypertrophy. No bony spinal canal stenosis. Upper chest: No pneumothorax, pulmonary nodule or pleural effusion. Other: Normal visualized paraspinal cervical soft tissues. IMPRESSION: 1. No acute intracranial abnormality. 2. Chronic microvascular ischemia. 3. No acute fracture or acute static subluxation of the cervical spine. 4. Multilevel facet arthrosis resulting and degenerative grade 1 anterolisthesis at C2-C3 and C4-C5. Electronically Signed   By: Ulyses Jarred M.D.   On: 11/28/2016 20:50  Dg Hip Unilat W Or Wo Pelvis 2-3 Views Left  Result Date: 11/28/2016 CLINICAL DATA:  83 year old female with history of trauma from a fall earlier this evening complaining of left hip pain. EXAM: DG HIP (WITH OR WITHOUT PELVIS) 2-3V LEFT COMPARISON:  No priors. FINDINGS: Multiple views of the lung bony pelvis in the left hip demonstrate acute minimally displaced fractures of the left superior and inferior pubic rami. Remaining portions of the pelvis appear grossly intact. Visualize left proximal femur is intact. Femoral heads appear located. Severe joint space narrowing, subchondral sclerosis, subchondral cyst formation and osteophyte formation is noted in the hip joints bilaterally, compatible with advanced osteoarthritis. IMPRESSION: 1. Acute minimally displaced fractures of the left superior and inferior pubic rami. 2. Severe bilateral hip joint osteoarthritis, as above. Electronically Signed   By: Vinnie Langton M.D.    On: 11/28/2016 20:33     EKG: Not done in ED, will get one.   Assessment/Plan Principal Problem:   Closed fracture of left pubis [facility]) Active Problems:   Hypothyroidism   Hyperlipidemia   Hypokalemia   Fall   Essential hypertension   Cclosed fracture of left pubis [facility]):  Patient has moderate pain now. No neurovascular compromise. Orthopedic surgeon, Dr. Stann Mainland was consulted. Per EDP's note, Dr. Stann Mainland advises weight bearing as tolerated, pain control and PT evaluation. No surgical mgmt required and consult as needed.   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 -PT/OT  Hypothyroidism: Last TSH was on2.99 on 07/20/16 -Continue home Synthroid  HLD: -Crestor  Hypokalemia: K= 3.0 on admission. - Repleted - Check Mg level  HTN: -Continue home HCTZ -IV hydralazine when necessary   DVT ppx: SQ Lovenox Code Status: Full code Family Communication:  Yes, patient's  granddaughter at bed side Disposition Plan:  Anticipate discharge back to previous home environment Consults called: Orthopedic surgeon, Dr. Stann Mainland  Admission status: medical floor/inpt  Date of Service 11/29/2016    Ivor Costa [facility] Pager [phone]  If 7PM-7AM, please contact night-coverage [email or web address] Password [email or web address] 11/29/2016, 1:42 AM

## 2016-11-29 NOTE — Care Management CC44 (Signed)
Condition Code 44 Documentation Completed  Patient Details  Name: Ariel Medina MRN: [id number] Date of Birth: [birth year]   Condition Code 44 given:  Yes Patient signature on Condition Code 44 notice:  Yes Documentation of 2 MD's agreement:  Yes Code 44 added to claim:  Yes    Leeroy Cha, RN 11/29/2016, 4:26 PM

## 2016-11-29 NOTE — Clinical Social Work Note (Signed)
Clinical Social Work Assessment  Patient Details  Name: Ariel Medina MRN: [id number] Date of Birth: 10/06/1927  Date of referral:  11/29/16               Reason for consult:  Facility Placement                Permission sought to share information with:  Family Supports, Chartered certified accountant granted to share information::     Name::        Agency::  [facility]  Relationship::     Contact Information:     Housing/Transportation Living arrangements for the past 2 months:  Single Family Home Source of Information:  Patient Patient Interpreter Needed:  None Criminal Activity/Legal Involvement Pertinent to Current Situation/Hospitalization:  No - Comment as needed Significant Relationships:  Adult Children Lives with:  Adult Children Do you feel safe going back to the place where you live?  Yes Need for family participation in patient care:  Yes (Comment)  Care giving concerns: Patient admitted for fall and fracture. She lives alone and unable to care for self. Patient is agreeable for rehab intervention.    Social Worker assessment / plan:  CSW met with patient at bedside, explain role and reason for visit. Patient alert and oreiented and agreeable to talk. Patient reports she has fracture and found it difficulty to work with PT, therefore understands it will challenging to manage at her home alone. Patient states she lives in [place] and would like to go to [facility] which is close to her home.CSW educated patient about SNF process and  explained to the patient [employer] will need to authorize her insurance prior to going to SNF. Patient reports understanding.  Plan: Assist with discharge -SNF. FL2 completed./ submitted for PASRR.    Employment status:  Transport planner PT Recommendations:  24 Hour Supervision, [place] / Referral to community resources:  [place]  Patient/Family's Response to care:  Agreeable  Patient/Family's Understanding of and Emotional Response to Diagnosis, Current Treatment, and Prognosis:  No family at bedside. Patient understands she will will completed rehab before going home.   Emotional Assessment Appearance:  Developmentally appropriate Attitude/Demeanor/Rapport:    Affect (typically observed):  Accepting Orientation:  Oriented to Self, Oriented to Place, Oriented to  Time, Oriented to Situation Alcohol / Substance use:  Not Applicable Psych involvement (Current and /or in the community):  No (Comment)  Discharge Needs  Concerns to be addressed:  Discharge Planning Concerns, Care Coordination Readmission within the last 30 days:  No Current discharge risk:  Physical Impairment Barriers to Discharge:  Continued Medical Work up   [employer], LCSW 11/29/2016, 3:22 PM

## 2016-11-29 NOTE — Progress Notes (Signed)
Nutrition Brief Note  Patient identified per Hip Fracture protocol.  Wt Readings from Last 15 Encounters:  11/28/16 140 lb (63.5 kg)  09/01/16 142 lb (64.4 kg)  07/20/16 142 lb 2 oz (64.5 kg)  06/02/16 143 lb (64.9 kg)  01/16/16 143 lb 6 oz (65 kg)  07/18/15 142 lb 2 oz (64.5 kg)  05/13/15 144 lb 8 oz (65.5 kg)  02/21/15 138 lb 2 oz (62.7 kg)  12/26/14 136 lb 6.4 oz (61.9 kg)  10/24/14 135 lb 12.8 oz (61.6 kg)  06/10/14 144 lb (65.3 kg)  12/13/13 144 lb 3.2 oz (65.4 kg)  10/05/13 148 lb (67.1 kg)  08/01/13 147 lb (66.7 kg)  06/07/13 147 lb (66.7 kg)    Body mass index is 24.8 kg/m. Patient meets criteria for normal weight based on current BMI. L elbow skin tear s/p fall. Pt admitted following a fall leading to L pubic ramus fracture. No surgical intervention warranted at this time, per MD note.  Current diet order is Heart Healthy and patient consumed 90% of breakfast this AM. Labs and medications reviewed.   No nutrition interventions warranted at this time. If nutrition issues arise, please re-consult RD.     Jarome Matin, MS, RD, LDN, [facility] Inpatient Clinical Dietitian Pager # [phone] After hours/weekend pager # [phone]

## 2016-11-29 NOTE — Care Management Obs Status (Signed)
[place] NOTIFICATION   Patient Details  Name: LUBERTA GRABINSKI MRN: [id number] Date of Birth: 1928/01/02   Medicare Observation Status Notification Given:  Yes    Leeroy Cha, RN 11/29/2016, 4:26 PM

## 2016-11-29 NOTE — NC FL2 (Signed)
[place] LEVEL OF CARE SCREENING TOOL     IDENTIFICATION  Patient Name: Ariel Medina Birthdate: [birth year] Sex: female Admission Date (Current Location): 11/28/2016  [facility] and [place] Number:  Herbalist and Address:  [facility],  [place] [address], [place]      Provider Number: [id number]  Attending Physician Name and Address:  Cristal Ford, DO  Relative Name and Phone Number:       Current Level of Care: Hospital Recommended Level of Care: [place] Prior Approval Number:    Date Approved/Denied:   PASRR Number:    Discharge Plan: SNF    Current Diagnoses: Patient Active Problem List   Diagnosis Date Noted  . Essential hypertension 11/29/2016  . Closed fracture of multiple pubic rami, left, initial encounter ([place]) 11/29/2016  . Fall   . Closed fracture of left pubis ([place]) 11/28/2016  . Hypokalemia 11/28/2016  . PCP NOTES >>> 05/13/2015  . Allergic rhinitis 10/24/2014  . Annual physical exam 04/14/2011  . BACK PAIN, shoulder pain 12/11/2007  . Osteopenia 10/20/2007  . NEOPLASM, MALIGNANT, BREAST, HX OF 10/20/2007  . Hypothyroidism 01/30/2007  . Hyperlipidemia 01/30/2007  . HTN and mild edema 01/30/2007  . OSTEOARTHRITIS 01/30/2007    Orientation RESPIRATION BLADDER Height & Weight     Self, Time, Situation, Place  Normal Continent Weight: 140 lb (63.5 kg) Height:  '5\' 3"'$  (160 cm)  BEHAVIORAL SYMPTOMS/MOOD NEUROLOGICAL BOWEL NUTRITION STATUS      Continent Diet (Heart Healthy )  AMBULATORY STATUS COMMUNICATION OF NEEDS Skin   Extensive Assist Verbally Normal                       Personal Care Assistance Level of Assistance  Bathing, Feeding, Dressing Bathing Assistance: Limited assistance Feeding assistance: Independent Dressing Assistance: Limited assistance     Functional Limitations Info  Sight, Hearing, Speech Sight Info: Adequate Hearing Info: Adequate Speech Info:  Adequate    SPECIAL CARE FACTORS FREQUENCY  PT (By licensed PT), OT (By licensed OT)     PT Frequency: 5 OT Frequency: 5            Contractures Contractures Info: Not present    Additional Factors Info  Code Status, Allergies Code Status Info: Fullcode Allergies Info: Ibuprofen           Current Medications (11/29/2016):  This is the current hospital active medication list Current Facility-Administered Medications  Medication Dose Route Frequency Provider Last Rate Last Dose  . acetaminophen (TYLENOL) tablet 650 mg  650 mg Oral Q6H PRN Margette Fast, MD      . aspirin chewable tablet 81 mg  81 mg Oral Daily Ivor Costa, MD   81 mg at 11/29/16 1010  . calcium carbonate (OS-CAL - dosed in mg of elemental calcium) tablet 1,250 mg  1,250 mg Oral Q breakfast Ivor Costa, MD   1,250 mg at 11/29/16 0742  . cholecalciferol (VITAMIN D) tablet 1,000 Units  1,000 Units Oral Daily Ivor Costa, MD   1,000 Units at 11/29/16 1010  . cyclobenzaprine (FLEXERIL) tablet 5 mg  5 mg Oral QHS PRN Ivor Costa, MD   5 mg at 11/29/16 0031  . enoxaparin (LOVENOX) injection 40 mg  40 mg Subcutaneous Q24H Ivor Costa, MD   40 mg at 11/29/16 0031  . fluticasone (FLONASE) 50 MCG/ACT nasal spray 2 spray  2 spray Each Nare Daily PRN Ivor Costa, MD      .  hydrALAZINE (APRESOLINE) injection 5 mg  5 mg Intravenous Q2H PRN Ivor Costa, MD      . hydrochlorothiazide (HYDRODIURIL) tablet 25 mg  25 mg Oral Daily Ivor Costa, MD   25 mg at 11/29/16 1011  . levothyroxine (SYNTHROID, LEVOTHROID) tablet 75 mcg  75 mcg Oral QAC breakfast Ivor Costa, MD   75 mcg at 11/29/16 [phone]  . morphine 4 MG/ML injection 1 mg  1 mg Intravenous Q3H PRN Ivor Costa, MD      . multivitamin (PROSIGHT) tablet 1 tablet  1 tablet Oral BID Ivor Costa, MD   1 tablet at 11/29/16 1010  . ondansetron (ZOFRAN) injection 4 mg  4 mg Intravenous Q8H PRN Ivor Costa, MD      . oxyCODONE-acetaminophen (PERCOCET/ROXICET) 5-325 MG per tablet 1 tablet  1 tablet Oral Q4H PRN  Ivor Costa, MD   1 tablet at 11/29/16 1016  . polyethylene glycol (MIRALAX / GLYCOLAX) packet 17 g  17 g Oral Daily PRN Ivor Costa, MD      . potassium chloride (K-DUR) CR tablet 10 mEq  10 mEq Oral Daily Ivor Costa, MD   10 mEq at 11/29/16 1010  . rosuvastatin (CRESTOR) tablet 5 mg  5 mg Oral q1800 Ivor Costa, MD      . zolpidem (AMBIEN) tablet 5 mg  5 mg Oral QHS PRN Ivor Costa, MD         Discharge Medications: Please see discharge summary for a list of discharge medications.  Relevant Imaging Results:  Relevant Lab Results:   Additional Information ssn:[id number]  Lia Hopping, LCSW

## 2016-11-29 NOTE — Evaluation (Signed)
Physical Therapy Evaluation Patient Details Name: Ariel Medina MRN: [id number] DOB: 09-28-1927 Today's Date: 11/29/2016   History of Present Illness  Ariel Medina is a 83 y.o. female with medical history significant of hypertension, hyperlipidemia, hypothyroidism, syncope, osteopenia, diverticulitis, breast cancer (s/p of lumpectomy 2003), who presents with fall.  Acute minimally displaced fractures of the left superior and. Per ortho- WBAT  Clinical Impression  The patient requires assistance for all aspects of mobility. Recommend SNF as patient does not have 24/7 caregivers. Pt admitted with above diagnosis. Pt currently with functional limitations due to the deficits listed below (see PT Problem List). * Pt will benefit from skilled PT to increase their independence and safety with mobility to allow discharge to the venue listed below.       Follow Up Recommendations SNF;Supervision/Assistance - 24 hour    Equipment Recommendations  Rolling walker with 5" wheels    Recommendations for Other Services       Precautions / Restrictions Precautions Precautions: Fall Restrictions Weight Bearing Restrictions: No RLE Weight Bearing: Weight bearing as tolerated LLE Weight Bearing: Weight bearing as tolerated      Mobility  Bed Mobility Overal bed mobility: Needs Assistance Bed Mobility: Supine to Sit     Supine to sit: Mod assist     General bed mobility comments: , assist with trunk, moves slowly due to c/o pain  Transfers Overall transfer level: Needs assistance Equipment used: Rolling walker (2 wheeled) Transfers: Sit to/from [facility] Sit to Stand: Mod assist;From elevated surface Stand pivot transfers: Mod assist       General transfer comment: moves slowly to stand, multimodal cues for hand placement, to reach back. Small pivot steps using RW from bed to [facility] then to Recliner. Antalgic both legs   Ambulation/Gait                 Stairs            Wheelchair Mobility    Modified Rankin (Stroke Patients Only)       Balance Overall balance assessment: History of Falls;Needs assistance Sitting-balance support: Feet supported;Bilateral upper extremity supported   Sitting balance - Comments: relies on the arms to decrease weight on pelvis   Standing balance support: During functional activity;Bilateral upper extremity supported   Standing balance comment: relies on arms                             Pertinent Vitals/Pain Pain Assessment: Faces Faces Pain Scale: Hurts whole lot Pain Location: pubic area Pain Descriptors / Indicators: Cramping;Contraction;Spasm Pain Intervention(s): Premedicated before session;Repositioned;Monitored during session;Limited activity within patient's tolerance    Home Living Family/patient expects to be discharged to:: Private residence Living Arrangements: Children;Other relatives Available Help at Discharge: Family;Available PRN/intermittently Type of Home: House Home Access: Stairs to enter   [employer] of Steps: 1 Home Layout: One level Home Equipment: None      Prior Function Level of Independence: Independent         Comments: very active, drives, independent with all aspects of self care. Was getting things ready at church when she  fell,     Hand Dominance        Extremity/Trunk Assessment   Upper Extremity Assessment Upper Extremity Assessment: Defer to OT evaluation;LUE deficits/detail LUE Deficits / Details: has a sjin tear on left elbow, bleeding    Lower Extremity Assessment Lower Extremity Assessment: LLE deficits/detail;RLE deficits/detail RLE Deficits /  Details: decreased full weight bear tolerated on the leg during standing, relies on the  UE's for support to decrease weight on legs. LLE Deficits / Details: same as right    Cervical / Trunk Assessment Cervical / Trunk Assessment: Normal  Communication    Communication: No difficulties;HOH  Cognition Arousal/Alertness: Awake/alert Behavior During Therapy: WFL for tasks assessed/performed Overall Cognitive Status: Within Functional Limits for tasks assessed                                        General Comments      Exercises     Assessment/Plan    PT Assessment Patient needs continued PT services  PT Problem List Decreased strength;Decreased range of motion;Decreased activity tolerance;Decreased balance;Decreased mobility;Decreased knowledge of precautions;Decreased safety awareness;Pain       PT Treatment Interventions DME instruction;Gait training;Functional mobility training;Therapeutic activities;Patient/family education    PT Goals (Current goals can be found in the Care Plan section)  Acute Rehab PT Goals Patient Stated Goal: tto be  independent again PT Goal Formulation: With patient Time For Goal Achievement: 12/06/16 Potential to Achieve Goals: Good    Frequency Min 3X/week   Barriers to discharge Decreased caregiver support      Co-evaluation               End of Session   Activity Tolerance: Patient limited by pain Patient left: in chair;with call bell/phone within reach;with chair alarm set Nurse Communication: Mobility status PT Visit Diagnosis: Difficulty in walking, not elsewhere classified (R26.2);Pain Pain - part of body: Leg    Time: 9872-1587 PT Time Calculation (min) (ACUTE ONLY): 36 min   Charges:   PT Evaluation $PT Eval Low Complexity: 1 Procedure PT Treatments $Therapeutic Activity: 8-22 mins   PT G CodesTresa Endo PT [phone]   Claretha Cooper 11/29/2016, 12:36 PM

## 2016-11-30 ENCOUNTER — Ambulatory Visit: Payer: Medicare HMO | Admitting: Podiatry

## 2016-11-30 DIAGNOSIS — S3289XA Fracture of other parts of pelvis, initial encounter for closed fracture: Secondary | ICD-10-CM | POA: Diagnosis not present

## 2016-11-30 DIAGNOSIS — Z9181 History of falling: Secondary | ICD-10-CM | POA: Diagnosis not present

## 2016-11-30 DIAGNOSIS — S32592A Other specified fracture of left pubis, initial encounter for closed fracture: Secondary | ICD-10-CM | POA: Diagnosis not present

## 2016-11-30 DIAGNOSIS — S0990XA Unspecified injury of head, initial encounter: Secondary | ICD-10-CM | POA: Diagnosis not present

## 2016-11-30 DIAGNOSIS — S3993XA Unspecified injury of pelvis, initial encounter: Secondary | ICD-10-CM | POA: Diagnosis not present

## 2016-11-30 DIAGNOSIS — S32502A Unspecified fracture of left pubis, initial encounter for closed fracture: Secondary | ICD-10-CM

## 2016-11-30 DIAGNOSIS — Z7982 Long term (current) use of aspirin: Secondary | ICD-10-CM | POA: Diagnosis not present

## 2016-11-30 DIAGNOSIS — M160 Bilateral primary osteoarthritis of hip: Secondary | ICD-10-CM | POA: Diagnosis not present

## 2016-11-30 DIAGNOSIS — W19XXXA Unspecified fall, initial encounter: Secondary | ICD-10-CM | POA: Diagnosis not present

## 2016-11-30 DIAGNOSIS — I10 Essential (primary) hypertension: Secondary | ICD-10-CM | POA: Diagnosis not present

## 2016-11-30 DIAGNOSIS — M6281 Muscle weakness (generalized): Secondary | ICD-10-CM | POA: Diagnosis not present

## 2016-11-30 DIAGNOSIS — E039 Hypothyroidism, unspecified: Secondary | ICD-10-CM | POA: Diagnosis not present

## 2016-11-30 DIAGNOSIS — S32592D Other specified fracture of left pubis, subsequent encounter for fracture with routine healing: Secondary | ICD-10-CM | POA: Diagnosis not present

## 2016-11-30 DIAGNOSIS — Z853 Personal history of malignant neoplasm of breast: Secondary | ICD-10-CM | POA: Diagnosis not present

## 2016-11-30 DIAGNOSIS — E876 Hypokalemia: Secondary | ICD-10-CM | POA: Diagnosis not present

## 2016-11-30 DIAGNOSIS — S32810D Multiple fractures of pelvis with stable disruption of pelvic ring, subsequent encounter for fracture with routine healing: Secondary | ICD-10-CM | POA: Diagnosis not present

## 2016-11-30 DIAGNOSIS — S32502D Unspecified fracture of left pubis, subsequent encounter for fracture with routine healing: Secondary | ICD-10-CM | POA: Diagnosis not present

## 2016-11-30 DIAGNOSIS — W19XXXD Unspecified fall, subsequent encounter: Secondary | ICD-10-CM | POA: Diagnosis not present

## 2016-11-30 DIAGNOSIS — R488 Other symbolic dysfunctions: Secondary | ICD-10-CM | POA: Diagnosis not present

## 2016-11-30 DIAGNOSIS — R262 Difficulty in walking, not elsewhere classified: Secondary | ICD-10-CM | POA: Diagnosis not present

## 2016-11-30 DIAGNOSIS — R102 Pelvic and perineal pain: Secondary | ICD-10-CM | POA: Diagnosis not present

## 2016-11-30 DIAGNOSIS — E785 Hyperlipidemia, unspecified: Secondary | ICD-10-CM | POA: Diagnosis not present

## 2016-11-30 DIAGNOSIS — Z79899 Other long term (current) drug therapy: Secondary | ICD-10-CM | POA: Diagnosis not present

## 2016-11-30 DIAGNOSIS — M1990 Unspecified osteoarthritis, unspecified site: Secondary | ICD-10-CM | POA: Diagnosis not present

## 2016-11-30 LAB — BASIC METABOLIC PANEL
Anion gap: 9 (ref 5–15)
BUN: 9 mg/dL (ref 4–21)
BUN: 9 mg/dL (ref 6–20)
CO2: 28 mmol/L (ref 22–32)
CREATININE: 0.52 mg/dL (ref 0.44–1.00)
Calcium: 9 mg/dL (ref 8.9–10.3)
Chloride: 94 mmol/L — ABNORMAL LOW (ref 101–111)
Creatinine: 0.5 mg/dL (ref 0.5–1.1)
GFR calc Af Amer: >60 mL/min (ref >60)
GFR calc non Af Amer: >60 mL/min (ref >60)
GLUCOSE: 102 mg/dL — AB (ref 65–99)
Glucose: 102 mg/dL
POTASSIUM: 3.7 mmol/L (ref 3.5–5.1)
Sodium: 131 mmol/L — AB (ref 137–147)
Sodium: 131 mmol/L — ABNORMAL LOW (ref 135–145)

## 2016-11-30 MED ORDER — OXYCODONE-ACETAMINOPHEN 5-325 MG PO TABS
1.0000 | ORAL_TABLET | ORAL | 0 refills | Status: DC | PRN
Start: 2016-11-30 — End: 2016-12-01

## 2016-11-30 MED ORDER — POLYETHYLENE GLYCOL 3350 17 G PO PACK: 17.0000 g | PACK | Freq: Every day | ORAL | 0 refills | Status: DC | PRN

## 2016-11-30 NOTE — Clinical Social Work Placement (Signed)
   CLINICAL SOCIAL WORK PLACEMENT  NOTE  Date:  11/30/2016  Patient Details  Name: Ariel Medina MRN: [id number] Date of Birth: 01/22/1928  Clinical Social Work is seeking post-discharge placement for this patient at the [place] level of care (*CSW will initial, date and re-position this form in  chart as items are completed):  Yes   Patient/family provided with [place] [facility] Work Department's list of facilities offering this level of care within the geographic area requested by the patient (or if unable, by the patient's family).  Yes   Patient/family informed of their freedom to choose among providers that offer the needed level of care, that participate in Medicare, Medicaid or managed care program needed by the patient, have an available bed and are willing to accept the patient.  Yes   Patient/family informed of [place]'s ownership interest in [facility] and [facility], as well as of the fact that they are under no obligation to receive care at these facilities.  PASRR submitted to EDS on       PASRR number received on       Existing PASRR number confirmed on 11/29/16     FL2 transmitted to all facilities in geographic area requested by pt/family on       FL2 transmitted to all facilities within larger geographic area on 11/29/16     Patient informed that his/her managed care company has contracts with or will negotiate with certain facilities, including the following:  [facility] and Rehab     Yes   Patient/family informed of bed offers received.  Patient chooses bed at [facility] and [place] recommends and patient chooses bed at [facility]    Patient to be transferred to [facility] on 11/30/16.  Patient to be transferred to facility by [facility]AR     Patient family notified on 11/30/16 of transfer.  Name of family member notified:  Patient reports she will contact  family.      PHYSICIAN       Additional Comment:    _______________________________________________ Lia Hopping, LCSW 11/30/2016, 3:53 PM

## 2016-11-30 NOTE — Progress Notes (Signed)
Date: November 30, 2016 Discharge orders review for case management needs.  None found Velva Harman, BSN, RN3, CCM:

## 2016-11-30 NOTE — Progress Notes (Signed)
   11/29/16 1229  PT G-Codes **NOT FOR INPATIENT CLASS**  Functional Assessment Tool Used Clinical judgement  Functional Limitation Mobility: Walking and moving around  Mobility: Walking and Moving Around Current Status (U0479) CK  Mobility: Walking and Moving Around Goal Status (V8721) CJ   Entered G Code for evaluating PT Ariel Medina based upon her clinical judgement as described and noted in her documentation.  Clide Dales, PT Pager: [phone] 11/30/2016

## 2016-11-30 NOTE — Discharge Summary (Signed)
Physician Discharge Summary  Ariel Medina:[id number] DOB: 25-Feb-1928 DOA: 11/28/2016  PCP: Ariel November, MD  Admit date: 11/28/2016 Discharge date: 11/30/2016  Time spent: 45 minutes  Recommendations for Outpatient Follow-up:  Patient will be discharged to skilled nursing facility. Continue physical and occupational therapy.  Patient will need to follow up with primary care provider within one week of discharge.  Follow up with orthopedics, Ariel Medina, as needed. Patient should continue medications as prescribed.  Patient should follow a heart healthy diet.   Discharge Diagnoses:  Lungs left pubic ramus fracture Hypothyroidism Hyperlipidemia Hypokalemia Essential Medina  Discharge Condition: Stable  Diet recommendation: heart healthy  Filed Weights   11/28/16 1921  Weight: 63.5 kg (140 lb)    History of present illness:  On 11/29/2016 by Ariel Medina, hyperlipidemia, hypothyroidism, syncope, osteopenia, diverticulitis, breast cancer (s/p of lumpectomy 2003), who presents with fall.  Patient states that she tripped when she tried to open the door and missed handle at church, and fell,hitting cement at church. Reports that she hit the back of her head and left hip. She developed a severe pain in left hip, which is constant, 7 out of 10 in severity, nonradiating. No numbness or weakness in legs. Patient denies LOC. No chest pain, shortness rest, cough, nausea, vomiting, diarrhea, abdominal pain, symptoms of UTI or unilateral weakness.  Hospital Course:  Closed left pubic ramus fracture -Status post fall -X-ray left hip and pelvis showed acute minimally displaced fractures of the left superior and inferior pubic rami. Severe bilateral hip joint osteoarthritis -CT head and cervical spine: Lacunar cranial normality, no acute fracture or acute static subluxation of the cervical spine. -Continue  pain control -PT and OT consulted and recommended SNF -EDP spoke with Ariel Medina, orthopedic, who advised weightbearing as tolerated, pain control and PT evaluation. No surgical management required.  Hypothyroidism -Continue Synthroid  Hyperlipidemia -Continue statin  Hypokalemia -Resolved -Continue potassium supplementation  Essential Medina  -Continue HCTZ -Suspect made worse by pain -Continue IV hydralazine  Consultants Orthopedic surgery, Ariel Medina, via phone by EDP  Procedures  None  Discharge Exam: Vitals:   11/30/16 0019 11/30/16 0431  BP: (!) 142/74 (!) 157/82  Pulse: 84 81  Resp: 18 18  Temp: 98.6 F (37 C) 98.2 F (36.8 C)     General: Well developed, well nourished, NAD, appears stated age  HEENT: NCAT, mucous membranes moist.  Cardiovascular: S1 S2 auscultated, RRR, no murmurs   Respiratory: Clear to auscultation bilaterally with equal chest rise  Abdomen: Soft, nontender, nondistended, + bowel sounds  Extremities: warm dry without cyanosis clubbing or edema  Neuro: AAOx3, nonfocal  Psych: Normal affect and demeanor, pleasant   Discharge Instructions Discharge Instructions    Discharge instructions    Complete by:  As directed    Patient will be discharged to skilled nursing facility. Continue physical and occupational therapy.  Patient will need to follow up with primary care provider within one week of discharge.  Follow up with orthopedics, Ariel Medina, as needed. Patient should continue medications as prescribed.  Patient should follow a heart healthy diet.     Current Discharge Medication List    START taking these medications   Details  oxyCODONE-acetaminophen (PERCOCET/ROXICET) 5-325 MG tablet Take 1 tablet by mouth every 4 (four) hours as needed for moderate pain. Qty: 30 tablet, Refills: 0    polyethylene glycol (MIRALAX / GLYCOLAX) packet Take 17 g by  mouth daily as needed for moderate constipation. Qty: 14 each,  Refills: 0      CONTINUE these medications which have NOT CHANGED   Details  acetaminophen (TYLENOL) 500 MG tablet Take 500 mg by mouth every 6 (six) hours as needed for mild pain, moderate pain or headache.     aspirin 81 MG tablet Take 81 mg by mouth every morning.     Calcium Carbonate-Vitamin D (CALTRATE 600+D) 600-400 MG-UNIT tablet Take 1 tablet by mouth daily.    cyclobenzaprine (FLEXERIL) 5 MG tablet Take 1 tablet (5 mg total) by mouth at bedtime as needed for muscle spasms. Qty: 30 tablet, Refills: 0    fluticasone (FLONASE) 50 MCG/ACT nasal spray Place 2 sprays into both nostrils daily as needed for allergies or rhinitis. Qty: 48 g, Refills: 2    hydrochlorothiazide (HYDRODIURIL) 25 MG tablet Take 1 tablet (25 mg total) by mouth daily. Qty: 90 tablet, Refills: 1    levothyroxine (SYNTHROID, LEVOTHROID) 75 MCG tablet Take 1 tablet (75 mcg total) by mouth daily before breakfast. Qty: 90 tablet, Refills: 1    Multiple Vitamins-Minerals (ICAPS AREDS 2 PO) Take 1 capsule by mouth 2 (two) times daily.    potassium chloride (K-DUR) 10 MEQ tablet Take 1 tablet (10 mEq total) by mouth daily. Qty: 90 tablet, Refills: 2    rosuvastatin (CRESTOR) 5 MG tablet Take 1 tablet (5 mg total) by mouth daily. Qty: 90 tablet, Refills: 2      STOP taking these medications     Calcium Carbonate (CALTRATE 600 PO)      VITAMIN D, CHOLECALCIFEROL, PO        Allergies  Allergen Reactions  . Ibuprofen     REACTION: bleed   Follow-up [place], MD. Schedule an appointment as soon as possible for a visit in 1 week(s).   Specialty:  Internal Medicine Why:  Hospital follow up Contact information: [address] [place] [address] [phone]        Ariel Patrick Rogers, MD. Schedule an appointment as soon as possible for a visit.   Specialty:  Orthopedic Surgery Why:  As needed Contact information: [address] [place]  [address] [phone]            The results of significant diagnostics from this hospitalization (including imaging, microbiology, ancillary and laboratory) are listed below for reference.    Significant Diagnostic Studies: Ct Head Wo Contrast  Result Date: 11/28/2016 CLINICAL DATA:  Loss of balance with fall and left-sided neck pain. EXAM: CT HEAD WITHOUT CONTRAST CT CERVICAL SPINE WITHOUT CONTRAST TECHNIQUE: Multidetector CT imaging of the head and cervical spine was performed following the standard protocol without intravenous contrast. Multiplanar CT image reconstructions of the cervical spine were also generated. COMPARISON:  Head CT 09/23/2003 FINDINGS: CT HEAD FINDINGS Brain: No mass lesion, intraparenchymal hemorrhage or extra-axial collection. No evidence of acute cortical infarct. There is periventricular hypoattenuation compatible with chronic microvascular disease. Vascular: No hyperdense vessel or unexpected calcification. Skull: Normal visualized skull base, calvarium and extracranial soft tissues. Sinuses/Orbits: No sinus fluid levels or advanced mucosal thickening. No mastoid effusion. Normal orbits. CT CERVICAL SPINE FINDINGS Alignment: No acute static subluxation. Trace grade 1 anterolisthesis at C2-C3 and C4-C5 is unchanged. The facets are aligned. Occipital condyles are normally positioned. Skull base and vertebrae: No acute fracture. Soft tissues and spinal canal: No prevertebral fluid or swelling. No visible canal hematoma. Disc levels: There is  severe right C4-C5 facet hypertrophy and moderate bilateral C2-C3 facet hypertrophy. No bony spinal canal stenosis. Upper chest: No pneumothorax, pulmonary nodule or pleural effusion. Other: Normal visualized paraspinal cervical soft tissues. IMPRESSION: 1. No acute intracranial abnormality. 2. Chronic microvascular ischemia. 3. No acute fracture or acute static subluxation of the cervical spine. 4. Multilevel facet arthrosis resulting and  degenerative grade 1 anterolisthesis at C2-C3 and C4-C5. Electronically Signed   By: Ulyses Jarred M.D.   On: 11/28/2016 20:50   Ct Cervical Spine Wo Contrast  Result Date: 11/28/2016 CLINICAL DATA:  Loss of balance with fall and left-sided neck pain. EXAM: CT HEAD WITHOUT CONTRAST CT CERVICAL SPINE WITHOUT CONTRAST TECHNIQUE: Multidetector CT imaging of the head and cervical spine was performed following the standard protocol without intravenous contrast. Multiplanar CT image reconstructions of the cervical spine were also generated. COMPARISON:  Head CT 09/23/2003 FINDINGS: CT HEAD FINDINGS Brain: No mass lesion, intraparenchymal hemorrhage or extra-axial collection. No evidence of acute cortical infarct. There is periventricular hypoattenuation compatible with chronic microvascular disease. Vascular: No hyperdense vessel or unexpected calcification. Skull: Normal visualized skull base, calvarium and extracranial soft tissues. Sinuses/Orbits: No sinus fluid levels or advanced mucosal thickening. No mastoid effusion. Normal orbits. CT CERVICAL SPINE FINDINGS Alignment: No acute static subluxation. Trace grade 1 anterolisthesis at C2-C3 and C4-C5 is unchanged. The facets are aligned. Occipital condyles are normally positioned. Skull base and vertebrae: No acute fracture. Soft tissues and spinal canal: No prevertebral fluid or swelling. No visible canal hematoma. Disc levels: There is severe right C4-C5 facet hypertrophy and moderate bilateral C2-C3 facet hypertrophy. No bony spinal canal stenosis. Upper chest: No pneumothorax, pulmonary nodule or pleural effusion. Other: Normal visualized paraspinal cervical soft tissues. IMPRESSION: 1. No acute intracranial abnormality. 2. Chronic microvascular ischemia. 3. No acute fracture or acute static subluxation of the cervical spine. 4. Multilevel facet arthrosis resulting and degenerative grade 1 anterolisthesis at C2-C3 and C4-C5. Electronically Signed   By: Ulyses Jarred M.D.   On: 11/28/2016 20:50   Dg Hip Unilat W Or Wo Pelvis 2-3 Views Left  Result Date: 11/28/2016 CLINICAL DATA:  83 year old female with history of trauma from a fall earlier this evening complaining of left hip pain. EXAM: DG HIP (WITH OR WITHOUT PELVIS) 2-3V LEFT COMPARISON:  No priors. FINDINGS: Multiple views of the lung bony pelvis in the left hip demonstrate acute minimally displaced fractures of the left superior and inferior pubic rami. Remaining portions of the pelvis appear grossly intact. Visualize left proximal femur is intact. Femoral heads appear located. Severe joint space narrowing, subchondral sclerosis, subchondral cyst formation and osteophyte formation is noted in the hip joints bilaterally, compatible with advanced osteoarthritis. IMPRESSION: 1. Acute minimally displaced fractures of the left superior and inferior pubic rami. 2. Severe bilateral hip joint osteoarthritis, as above. Electronically Signed   By: Vinnie Langton M.D.   On: 11/28/2016 20:33    Microbiology: No results found for this or any previous visit (from the past 240 hour(s)).   Labs: Basic Metabolic Panel:  Recent Labs Lab 11/28/16 2040 11/29/16 0556 11/30/16 0656  NA 135 135 131*  K 3.0* 3.4* 3.7  CL 96* 98* 94*  CO2 '31 30 28  '$ GLUCOSE 114* 112* 102*  BUN '14 11 9  '$ CREATININE 0.75 0.63 0.52  CALCIUM 9.1 8.8* 9.0   Liver Function Tests: No results for input(s): AST, ALT, ALKPHOS, BILITOT, PROT, ALBUMIN in the last 168 hours. No results for input(s): LIPASE, AMYLASE in the last  168 hours. No results for input(s): AMMONIA in the last 168 hours. CBC:  Recent Labs Lab 11/28/16 2040 11/29/16 0556  WBC 9.2 7.1  NEUTROABS 7.6  --   HGB 14.1 13.2  HCT 42.5 39.6  MCV 95.7 95.4  PLT 188 187   Cardiac Enzymes: No results for input(s): CKTOTAL, CKMB, CKMBINDEX, TROPONINI in the last 168 hours. BNP: BNP (last 3 results) No results for input(s): BNP in the last 8760 hours.  ProBNP  (last 3 results) No results for input(s): PROBNP in the last 8760 hours.  CBG: No results for input(s): GLUCAP in the last 168 hours.     SignedCristal Ford  [facility] 11/30/2016, 1:02 PM

## 2016-11-30 NOTE — Progress Notes (Addendum)
Report given to Olu at [facility]. No questions or concerns at this time. Patient will be transferred via [place].

## 2016-11-30 NOTE — Progress Notes (Signed)
CSW working actively with [employer] to get authorization from [employer].Will inform medical staff received.   Kathrin Greathouse, Latanya Presser, MSW Clinical Social Worker 5E and Psychiatric Service Line [phone] 11/30/2016  11:16 AM

## 2016-11-30 NOTE — Progress Notes (Signed)
Physical Therapy Treatment Patient Details Name: Ariel Medina MRN: [id number] DOB: April 25, 1928 Today's Date: 11/30/2016    History of Present Illness Ariel Medina is a 83 y.o. female with medical history significant of hypertension, hyperlipidemia, hypothyroidism, syncope, osteopenia, diverticulitis, breast cancer (s/p of lumpectomy 2003), who presents with fall.  Acute minimally displaced fractures of the left superior and. Per ortho- WBAT    PT Comments    Pt sitting EOB Indep with NT in room on arrival.  RN reports OT was just in.  Attempted amb with RW however pt was unable to weight shift enough to advance either LE.  Attempted amb with B platform EVA walker with great difficulty pt was only able to advance gait 5 feet.  Pt was pre medicated for pain.   Pt will need ST Rehab at SNF prior to returning home.  Follow Up Recommendations  SNF     Equipment Recommendations  Rolling walker with 5" wheels    Recommendations for Other Services       Precautions / Restrictions Precautions Precautions: Fall Restrictions Weight Bearing Restrictions: No RLE Weight Bearing: Weight bearing as tolerated LLE Weight Bearing: Weight bearing as tolerated    Mobility  Bed Mobility               General bed mobility comments: pt sitting EOB Indep with NT in room on arrival  Transfers Overall transfer level: Needs assistance Equipment used: Rolling walker (2 wheeled) Transfers: Sit to/from [facility] Sit to Stand: Mod assist;From elevated surface Stand pivot transfers: Mod assist       General transfer comment: 25% VC's on proper hand placement and increased time to rise and decend due to pain  Ambulation/Gait Ambulation/Gait assistance: Max assist Ambulation Distance (Feet): 5 Feet Assistive device: Rolling walker (2 wheeled);Bilateral platform walker Gait Pattern/deviations: Step-to pattern;Decreased stance time - left;Decreased step length -  right;Decreased step length - left Gait velocity: decreased   General Gait Details: first attempted gait with RW however pt was unable to tolerate WBing thru L LE and unable to advance R LE due to pain and fatigue(upper body weakness and pain L UU from fall).  Second used B platform EVA walker for increased upper support pt was only able to advancwe gait to 5 feet with much effort/difficulty tolerating much WBing thru L LE and difficulty advancing R LE.     Stairs            Wheelchair Mobility    Modified Rankin (Stroke Patients Only)       Balance                                            Cognition Arousal/Alertness: Awake/alert Behavior During Therapy: WFL for tasks assessed/performed Overall Cognitive Status: Within Functional Limits for tasks assessed                                        Exercises      General Comments        Pertinent Vitals/Pain Pain Assessment: Faces Faces Pain Scale: Hurts whole lot Pain Location: L posterior hip Pain Descriptors / Indicators: Grimacing;Sore Pain Intervention(s): Monitored during session;Repositioned    Home Living  Prior Function            PT Goals (current goals can now be found in the care plan section) Progress towards PT goals: Progressing toward goals    Frequency    Min 3X/week      PT Plan Current plan remains appropriate    Co-evaluation             End of Session Equipment Utilized During Treatment: Gait belt Activity Tolerance: Patient limited by pain Patient left: in bed Nurse Communication: Mobility status PT Visit Diagnosis: Difficulty in walking, not elsewhere classified (R26.2);Pain     Time: 7096-4383 PT Time Calculation (min) (ACUTE ONLY): 12 min  Charges:  $Gait Training: 8-22 mins                    G Codes:       {Karita Dralle  PTA [facility]  Acute  Rehab Pager      [phone]

## 2016-11-30 NOTE — Evaluation (Signed)
Occupational Therapy Evaluation Patient Details Name: Ariel Medina MRN: [id number] DOB: 08/28/1927 Today's Date: 11/30/2016    History of Present Illness Ariel Medina is a 83 y.o. female with medical history significant of hypertension, hyperlipidemia, hypothyroidism, syncope, osteopenia, diverticulitis, breast cancer (s/p of lumpectomy 2003), who presents with fall.  Acute minimally displaced fractures of the left superior and. Per ortho- WBAT   Clinical Impression   Pt admitted with fall with pubic fx Pt currently with functional limitations due to the deficits listed below (see OT Problem List).  Pt will benefit from skilled OT to increase their safety and independence with ADL and functional mobility for ADL to facilitate discharge to venue listed below.      Follow Up Recommendations  SNF    Equipment Recommendations  3 in 1 bedside commode    Recommendations for Other Services       Precautions / Restrictions Precautions Precautions: Fall Restrictions Weight Bearing Restrictions: No RLE Weight Bearing: Weight bearing as tolerated LLE Weight Bearing: Weight bearing as tolerated      Mobility Bed Mobility   Bed Mobility: Sit to Supine       Sit to supine: Mod assist   General bed mobility comments: pt sitting EOB Indep with NT in room on arrival  Transfers Overall transfer level: Needs assistance Equipment used: Rolling walker (2 wheeled) Transfers: Sit to/from [facility] Sit to Stand: Mod assist Stand pivot transfers: Mod assist       General transfer comment: 25% VC's on proper hand placement and increased time to rise and decend due to pain        ADL either performed or assessed with clinical judgement   ADL Overall ADL's : Needs assistance/impaired Eating/Feeding: Set up;Sitting   Grooming: Sitting;Set up   Upper Body Bathing: Set up;Sitting   Lower Body Bathing: Maximal assistance;Sit to/from stand;Cueing for  safety;Cueing for sequencing;Cueing for compensatory techniques   Upper Body Dressing : Set up;Sitting   Lower Body Dressing: Maximal assistance;Sit to/from stand;Cueing for sequencing;Cueing for compensatory techniques;Cueing for safety   Toilet Transfer: Moderate assistance;BSC;RW;Stand-pivot   Toileting- Clothing Manipulation and Hygiene: Moderate assistance;Sit to/from stand;Cueing for safety;Cueing for sequencing;Cueing for compensatory techniques                            Pertinent Vitals/Pain Pain Assessment: Faces Faces Pain Scale: Hurts whole lot Pain Location: L hip Pain Descriptors / Indicators: Discomfort;Grimacing;Sore Pain Intervention(s): Monitored during session;Repositioned;Limited activity within patient's tolerance;Patient requesting pain meds-RN notified;RN gave pain meds during session        Extremity/Trunk Assessment Upper Extremity Assessment Upper Extremity Assessment: Generalized weakness           Communication Communication Communication: No difficulties;HOH   Cognition Arousal/Alertness: Awake/alert Behavior During Therapy: WFL for tasks assessed/performed Overall Cognitive Status: Within Functional Limits for tasks assessed                                                Home Living Family/patient expects to be discharged to:: Private residence Living Arrangements: Children;Other relatives Available Help at Discharge: Family;Available PRN/intermittently Type of Home: House Home Access: Stairs to enter Entrance Stairs-Number of Steps: 1   Home Layout: One level     Bathroom Shower/Tub: Tub/shower unit         Home  Equipment: None          Prior Functioning/Environment Level of Independence: Independent        Comments: very active, drives, independent with all aspects of self care. Was getting things ready at church when she  fell,        OT Problem List: Decreased strength;Decreased activity  tolerance;Pain;Decreased knowledge of use of DME or AE      OT Treatment/Interventions: Self-care/ADL training;Patient/family education;DME and/or AE instruction;Therapeutic activities    OT Goals(Current goals can be found in the care plan section) Acute Rehab OT Goals Patient Stated Goal: tto be  independent again OT Goal Formulation: With patient Time For Goal Achievement: 12/14/16  OT Frequency: Min 2X/week   Barriers to D/C:               End of Session Equipment Utilized During Treatment: Gait belt;Rolling walker Nurse Communication: Mobility status  Activity Tolerance: Patient tolerated treatment well Patient left: in bed;Other (comment)  OT Visit Diagnosis: Unsteadiness on feet (R26.81);Pain;Other abnormalities of gait and mobility (R26.89) Pain - part of body: Hip                Time: 4462-8638 OT Time Calculation (min): 19 min Charges:  OT General Charges $OT Visit: 1 Procedure OT Evaluation $OT Eval Moderate Complexity: 1 Procedure G-Codes: OT G-codes **NOT FOR INPATIENT CLASS** Functional Assessment Tool Used: Clinical judgement Functional Limitation: Self care Self Care Current Status (T7711): At least 40 percent but less than 60 percent impaired, limited or restricted Self Care Goal Status (A5790): At least 1 percent but less than 20 percent impaired, limited or restricted   Kari Baars, [place]  Payton Mccallum D 11/30/2016, 11:35 AM

## 2016-12-01 ENCOUNTER — Encounter: Payer: Self-pay | Admitting: Internal Medicine

## 2016-12-01 ENCOUNTER — Non-Acute Institutional Stay (SKILLED_NURSING_FACILITY): Payer: Medicare HMO | Admitting: Internal Medicine

## 2016-12-01 DIAGNOSIS — S32502D Unspecified fracture of left pubis, subsequent encounter for fracture with routine healing: Secondary | ICD-10-CM | POA: Diagnosis not present

## 2016-12-01 DIAGNOSIS — E039 Hypothyroidism, unspecified: Secondary | ICD-10-CM | POA: Diagnosis not present

## 2016-12-01 DIAGNOSIS — E876 Hypokalemia: Secondary | ICD-10-CM | POA: Diagnosis not present

## 2016-12-01 DIAGNOSIS — W19XXXD Unspecified fall, subsequent encounter: Secondary | ICD-10-CM | POA: Diagnosis not present

## 2016-12-01 DIAGNOSIS — I10 Essential (primary) hypertension: Secondary | ICD-10-CM

## 2016-12-01 DIAGNOSIS — E785 Hyperlipidemia, unspecified: Secondary | ICD-10-CM | POA: Diagnosis not present

## 2016-12-01 DIAGNOSIS — S32592D Other specified fracture of left pubis, subsequent encounter for fracture with routine healing: Secondary | ICD-10-CM

## 2016-12-01 NOTE — Progress Notes (Addendum)
: Provider:  Noah Delaine. Sheppard Coil, MD Location:  [facility] Room Number: 315V Place of Service:  SNF ([phone])  PCP: Kathlene November, MD Patient Care Team: Colon Branch, MD as PCP - General Thornell Sartorius, MD as Consulting Physician (Otolaryngology)  Extended Emergency Contact Information Primary Emergency Contact: [name] Address: [place], [place] Phone: [phone] Relation: Son     Allergies: Ibuprofen  Chief Complaint  Patient presents with  . New Admit To SNF    Admit to Facility    HPI: Patient is 83 y.o. female with hypertension, hyperlipidemia, hypothyroidism, syncope, osteopenia, diverticulitis, breast cancer (s/p of lumpectomy 2003), who presents with fall. Patient stated that she tripped when she tried to open the door and missed handle at church, and fell,hitting cement at church. Reports that she hit the back of her head and left hip. She developed a severe pain in left hip, which is constant, 7 out of 10 in severity, nonradiating. No numbness or weakness in legs. Patient denies LOC. No chest pain, shortness rest, cough, nausea, vomiting, diarrhea, abdominal pain, symptoms of UTI or unilateral weakness. L hip and pelvis film showed fractures of l superior and inferior pubic rami, no hip fracture and pt was admitted to [facility] from 4/22-24 for pain control and supportive care. Pt is admitted to SNF for OT/PT. While at [facility] pt will be followed for HTN, tx with HCTZ, hypothyroidism, tx with synthroid and HLD, tx with crestor.  Past Medical History:  Diagnosis Date  . Breast CA ([place])    left breast-invasive ductal ca stage 1-stopped arimidex 2008  . Diverticulitis of colon   . Hiatal hernia   . Hyperlipidemia   . Hypertension   . Hypothyroidism   . Osteoarthritis   . Osteopenia    DEXA 7/02  . Syncope 2005   CT head (-), ECHO essent. neg, stress test (-), carotid u/s (-)    Past Surgical  History:  Procedure Laterality Date  . BREAST LUMPECTOMY  04/2002   left  . TONSILLECTOMY AND ADENOIDECTOMY      Allergies as of 12/01/2016      Reactions   Ibuprofen    REACTION: bleed      Medication List       Accurate as of 12/01/16  8:29 AM. Always use your most recent med list.          acetaminophen 500 MG tablet Commonly known as:  TYLENOL Take 500 mg by mouth every 6 (six) hours as needed for mild pain, moderate pain or headache.   aspirin 81 MG tablet Take 81 mg by mouth every morning.   CALTRATE 600+D 600-400 MG-UNIT tablet Generic drug:  Calcium Carbonate-Vitamin D Take 1 tablet by mouth daily.   cyclobenzaprine 5 MG tablet Commonly known as:  FLEXERIL Take 1 tablet (5 mg total) by mouth at bedtime as needed for muscle spasms.   fluticasone 50 MCG/ACT nasal spray Commonly known as:  FLONASE Place 2 sprays into both nostrils daily as needed for allergies or rhinitis.   hydrochlorothiazide 25 MG tablet Commonly known as:  HYDRODIURIL Take 1 tablet (25 mg total) by mouth daily.   ICAPS AREDS 2 PO Take 1 capsule by mouth 2 (two) times daily.   levothyroxine 75 MCG tablet Commonly known as:  SYNTHROID, LEVOTHROID Take 1 tablet (75 mcg total) by mouth daily before breakfast.  oxyCODONE-acetaminophen 5-325 MG tablet Commonly known as:  PERCOCET/ROXICET Take 1 tablet by mouth every 6 (six) hours as needed for moderate pain.   polyethylene glycol packet Commonly known as:  MIRALAX / GLYCOLAX Take 17 g by mouth daily as needed for moderate constipation.   potassium chloride 10 MEQ tablet Commonly known as:  K-DUR Take 1 tablet (10 mEq total) by mouth daily.   rosuvastatin 5 MG tablet Commonly known as:  CRESTOR Take 1 tablet (5 mg total) by mouth daily.       Meds ordered this encounter  Medications  . oxyCODONE-acetaminophen (PERCOCET/ROXICET) 5-325 MG tablet    Sig: Take 1 tablet by mouth every 6 (six) hours as needed for moderate pain.     Immunization History  Administered Date(s) Administered  . H1N1 08/29/2008  . Influenza Whole 05/12/2010  . Influenza, High Dose Seasonal PF 06/07/2013, 05/13/2015, 07/20/2016  . Influenza,inj,Quad PF,36+ Mos 06/10/2014  . Pneumococcal Conjugate-13 06/10/2014  . Pneumococcal Polysaccharide-23 07/10/2003, 02/21/2008  . Td 05/16/2007  . Zoster 12/13/2012    Social History  Substance Use Topics  . Smoking status: Never Smoker  . Smokeless tobacco: Never Used  . Alcohol use No    Family history is   Family History  Problem Relation Age of Onset  . Heart attack Brother 16  . Diabetes Mother   . Stroke Father 10  . Stroke Sister 102  . Colon cancer Neg Hx   . Breast cancer Neg Hx       Review of Systems  DATA OBTAINED: from patient, nurse GENERAL:  no fevers, +fatigue, appetite changes SKIN: No itching, or rash EYES: No eye pain, redness, discharge EARS: No earache, tinnitus, change in hearing NOSE: No congestion, drainage or bleeding  MOUTH/THROAT: No mouth or tooth pain, No sore throat RESPIRATORY: No cough, wheezing, SOB CARDIAC: No chest pain, palpitations, lower extremity edema  GI: No abdominal pain, No N/V/D or constipation, No heartburn or reflux  GU: No dysuria, frequency or urgency, or incontinence  MUSCULOSKELETAL: No unrelieved bone/joint pain NEUROLOGIC: No headache, dizziness or focal weakness PSYCHIATRIC: No c/o anxiety or sadness   Vitals:   12/01/16 0816  BP: 136/85  Pulse: 90  Resp: 18  Temp: 97.8 F (36.6 C)    SpO2 Readings from Last 1 Encounters:  12/01/16 90%   Body mass index is 24.8 kg/m.     Physical Exam  GENERAL APPEARANCE: Alert, conversant,  No acute distress.  SKIN: No diaphoresis rash HEAD: Normocephalic, atraumatic  EYES: Conjunctiva/lids clear. Pupils round, reactive. EOMs intact.  EARS: External exam WNL, canals clear. Hearing grossly normal.  NOSE: No deformity or discharge.  MOUTH/THROAT: Lips w/o lesions   RESPIRATORY: Breathing is even, unlabored. Lung sounds are clear   CARDIOVASCULAR: Heart RRR no murmurs, rubs or gallops. No peripheral edema.   GASTROINTESTINAL: Abdomen is soft, non-tender, not distended w/ normal bowel sounds. GENITOURINARY: Bladder non tender, not distended  MUSCULOSKELETAL: abrasion and bruising l elbow, no joint swelling; bruising L hip with TTP L groin NEUROLOGIC:  Cranial nerves 2-12 grossly intact. Moves all extremities  PSYCHIATRIC: Mood and affect appropriate to situation, no behavioral issues  Patient Active Problem List   Diagnosis Date Noted  . Essential hypertension 11/29/2016  . Closed fracture of multiple pubic rami, left, initial encounter ([place]) 11/29/2016  . Fall   . Closed fracture of left pubis ([place]) 11/28/2016  . Hypokalemia 11/28/2016  . PCP NOTES >>> 05/13/2015  . Allergic rhinitis 10/24/2014  . Annual physical  exam 04/14/2011  . BACK PAIN, shoulder pain 12/11/2007  . Osteopenia 10/20/2007  . NEOPLASM, MALIGNANT, BREAST, HX OF 10/20/2007  . Hypothyroidism 01/30/2007  . Hyperlipidemia 01/30/2007  . HTN and mild edema 01/30/2007  . OSTEOARTHRITIS 01/30/2007      Labs reviewed: Basic Metabolic Panel:    Component Value Date/Time   NA 131 (L) 11/30/2016 0656   NA 131 (A) 11/30/2016   K 3.7 11/30/2016 0656   CL 94 (L) 11/30/2016 0656   CO2 28 11/30/2016 0656   GLUCOSE 102 (H) 11/30/2016 0656   BUN 9 11/30/2016 0656   BUN 9 11/30/2016   CREATININE 0.52 11/30/2016 0656   CALCIUM 9.0 11/30/2016 0656   PROT 6.4 07/18/2015 0843   ALBUMIN 3.9 07/18/2015 0843   AST 17 07/20/2016 0844   ALT 8 07/20/2016 0844   ALKPHOS 42 07/18/2015 0843   BILITOT 0.8 07/18/2015 0843   GFRNONAA >60 11/30/2016 0656   GFRAA >60 11/30/2016 0656     Recent Labs  11/28/16 2040 11/29/16 11/29/16 0556 11/30/16 11/30/16 0656  NA 135 135* 135 131* 131*  K 3.0* 3.4 3.4*  --  3.7  CL 96*  --  98*  --  94*  CO2 31  --  30  --  28  GLUCOSE 114*  --  112*   --  102*  BUN '14 11 11 9 9  '$ CREATININE 0.75 0.6 0.63 0.5 0.52  CALCIUM 9.1  --  8.8*  --  9.0   Liver Function Tests:  Recent Labs  07/20/16 0844  AST 17  ALT 8   No results for input(s): LIPASE, AMYLASE in the last 8760 hours. No results for input(s): AMMONIA in the last 8760 hours. CBC:  Recent Labs  11/28/16 11/28/16 2040 11/29/16 11/29/16 0556  WBC 9.2 9.2 7.1 7.1  NEUTROABS  --  7.6  --   --   HGB 14.1 14.1  --  13.2  HCT 43 42.5  --  39.6  MCV  --  95.7  --  95.4  PLT 188 188  --  187   Lipid  Recent Labs  07/20/16 0844  CHOL 184  HDL 64.00  LDLCALC 103*  TRIG 88.0    Cardiac Enzymes: No results for input(s): CKTOTAL, CKMB, CKMBINDEX, TROPONINI in the last 8760 hours. BNP: No results for input(s): BNP in the last 8760 hours. No results found for: MICROALBUR No results found for: HGBA1C Lab Results  Component Value Date   TSH 2.99 07/20/2016   No results found for: VITAMINB12 No results found for: FOLATE No results found for: IRON, TIBC, FERRITIN  Imaging and Procedures obtained prior to SNF admission: Ct Head Wo Contrast  Result Date: 11/28/2016 CLINICAL DATA:  Loss of balance with fall and left-sided neck pain. EXAM: CT HEAD WITHOUT CONTRAST CT CERVICAL SPINE WITHOUT CONTRAST TECHNIQUE: Multidetector CT imaging of the head and cervical spine was performed following the standard protocol without intravenous contrast. Multiplanar CT image reconstructions of the cervical spine were also generated. COMPARISON:  Head CT 09/23/2003 FINDINGS: CT HEAD FINDINGS Brain: No mass lesion, intraparenchymal hemorrhage or extra-axial collection. No evidence of acute cortical infarct. There is periventricular hypoattenuation compatible with chronic microvascular disease. Vascular: No hyperdense vessel or unexpected calcification. Skull: Normal visualized skull base, calvarium and extracranial soft tissues. Sinuses/Orbits: No sinus fluid levels or advanced mucosal  thickening. No mastoid effusion. Normal orbits. CT CERVICAL SPINE FINDINGS Alignment: No acute static subluxation. Trace grade 1 anterolisthesis at C2-C3 and C4-C5  is unchanged. The facets are aligned. Occipital condyles are normally positioned. Skull base and vertebrae: No acute fracture. Soft tissues and spinal canal: No prevertebral fluid or swelling. No visible canal hematoma. Disc levels: There is severe right C4-C5 facet hypertrophy and moderate bilateral C2-C3 facet hypertrophy. No bony spinal canal stenosis. Upper chest: No pneumothorax, pulmonary nodule or pleural effusion. Other: Normal visualized paraspinal cervical soft tissues. IMPRESSION: 1. No acute intracranial abnormality. 2. Chronic microvascular ischemia. 3. No acute fracture or acute static subluxation of the cervical spine. 4. Multilevel facet arthrosis resulting and degenerative grade 1 anterolisthesis at C2-C3 and C4-C5. Electronically Signed   By: Ulyses Jarred M.D.   On: 11/28/2016 20:50   Ct Cervical Spine Wo Contrast  Result Date: 11/28/2016 CLINICAL DATA:  Loss of balance with fall and left-sided neck pain. EXAM: CT HEAD WITHOUT CONTRAST CT CERVICAL SPINE WITHOUT CONTRAST TECHNIQUE: Multidetector CT imaging of the head and cervical spine was performed following the standard protocol without intravenous contrast. Multiplanar CT image reconstructions of the cervical spine were also generated. COMPARISON:  Head CT 09/23/2003 FINDINGS: CT HEAD FINDINGS Brain: No mass lesion, intraparenchymal hemorrhage or extra-axial collection. No evidence of acute cortical infarct. There is periventricular hypoattenuation compatible with chronic microvascular disease. Vascular: No hyperdense vessel or unexpected calcification. Skull: Normal visualized skull base, calvarium and extracranial soft tissues. Sinuses/Orbits: No sinus fluid levels or advanced mucosal thickening. No mastoid effusion. Normal orbits. CT CERVICAL SPINE FINDINGS Alignment: No acute  static subluxation. Trace grade 1 anterolisthesis at C2-C3 and C4-C5 is unchanged. The facets are aligned. Occipital condyles are normally positioned. Skull base and vertebrae: No acute fracture. Soft tissues and spinal canal: No prevertebral fluid or swelling. No visible canal hematoma. Disc levels: There is severe right C4-C5 facet hypertrophy and moderate bilateral C2-C3 facet hypertrophy. No bony spinal canal stenosis. Upper chest: No pneumothorax, pulmonary nodule or pleural effusion. Other: Normal visualized paraspinal cervical soft tissues. IMPRESSION: 1. No acute intracranial abnormality. 2. Chronic microvascular ischemia. 3. No acute fracture or acute static subluxation of the cervical spine. 4. Multilevel facet arthrosis resulting and degenerative grade 1 anterolisthesis at C2-C3 and C4-C5. Electronically Signed   By: Ulyses Jarred M.D.   On: 11/28/2016 20:50   Dg Hip Unilat W Or Wo Pelvis 2-3 Views Left  Result Date: 11/28/2016 CLINICAL DATA:  83 year old female with history of trauma from a fall earlier this evening complaining of left hip pain. EXAM: DG HIP (WITH OR WITHOUT PELVIS) 2-3V LEFT COMPARISON:  No priors. FINDINGS: Multiple views of the lung bony pelvis in the left hip demonstrate acute minimally displaced fractures of the left superior and inferior pubic rami. Remaining portions of the pelvis appear grossly intact. Visualize left proximal femur is intact. Femoral heads appear located. Severe joint space narrowing, subchondral sclerosis, subchondral cyst formation and osteophyte formation is noted in the hip joints bilaterally, compatible with advanced osteoarthritis. IMPRESSION: 1. Acute minimally displaced fractures of the left superior and inferior pubic rami. 2. Severe bilateral hip joint osteoarthritis, as above. Electronically Signed   By: Vinnie Langton M.D.   On: 11/28/2016 20:33     Not all labs, radiology exams or other studies done during hospitalization come through on my  EPIC note; however they are reviewed by me.    Assessment and Plan  CLOSED L PUBIC RAMI FRACTURES, INF AND SUP/ FALL - ortho says WBAT; CT head and neck -NAD SNF - pt admitted for PT/OT; cont supportive care  HYPOKALEMIA - repleted SNF -  will f/u BMP  HTN SNF - controlled now; cont HCTZ25 mg daily; will monitor daily  HYPOTHYROIDISM SNF - last TSH 2.99; plan to cont synthroid 75 mg daily  HLD SNF - last FLP LDL 103 and HDL 61; plan to cont crestor 5 mg daily     Time spent > 45 min Deshaun Weisinger D. Sheppard Coil, MD

## 2016-12-06 LAB — BASIC METABOLIC PANEL
BUN: 14 mg/dL (ref 4–21)
CREATININE: 0.5 mg/dL (ref 0.5–1.1)
Glucose: 85 mg/dL
Potassium: 3.9 mmol/L (ref 3.4–5.3)
Sodium: 140 mmol/L (ref 137–147)

## 2016-12-06 LAB — CBC AND DIFFERENTIAL
HEMATOCRIT: 39 % (ref 36–46)
Hemoglobin: 12.9 g/dL (ref 12.0–16.0)
PLATELETS: 227 10*3/uL (ref 150–399)
WBC: 7 10^3/mL

## 2016-12-08 ENCOUNTER — Other Ambulatory Visit: Payer: Self-pay | Admitting: *Deleted

## 2016-12-08 NOTE — Patient Outreach (Signed)
[facility]) Care Management  12/08/2016  TOMIKO SCHOON [birth year] [id number]   Met with patient at facility. She confirms Dr. [name] as her MD. She states she had a fall at church that caused her to fracture her pelvis. She reports that her granddaughter lives with her. She reports before fall she was independent and had not had any hospitalizations in years.   No [facility] RNCM community care management needs identified. RNCM reviewed [facility] care management and gave brochure for future reference.  Patient said it would be fine for any telephonic follow up that may be needed in the future, such as EMMI calls.   Met with Vikki Ports, SW at facility, she states patient has great family support, she knows patient will need some DME at discharge. No complex care management needs identified at this time.   Plan to sign off.  Royetta Crochet. Laymond Purser, RN, BSN, [place] [phone]) Business Cell  [phone]) Toll Free Office

## 2016-12-13 DIAGNOSIS — S3289XA Fracture of other parts of pelvis, initial encounter for closed fracture: Secondary | ICD-10-CM | POA: Diagnosis not present

## 2016-12-13 DIAGNOSIS — S32810D Multiple fractures of pelvis with stable disruption of pelvic ring, subsequent encounter for fracture with routine healing: Secondary | ICD-10-CM | POA: Diagnosis not present

## 2016-12-16 ENCOUNTER — Telehealth: Payer: Self-pay | Admitting: *Deleted

## 2016-12-16 ENCOUNTER — Encounter: Payer: Self-pay | Admitting: Internal Medicine

## 2016-12-16 ENCOUNTER — Non-Acute Institutional Stay (SKILLED_NURSING_FACILITY): Payer: Medicare HMO | Admitting: Internal Medicine

## 2016-12-16 DIAGNOSIS — S32592D Other specified fracture of left pubis, subsequent encounter for fracture with routine healing: Secondary | ICD-10-CM

## 2016-12-16 DIAGNOSIS — S32502A Unspecified fracture of left pubis, initial encounter for closed fracture: Secondary | ICD-10-CM | POA: Diagnosis not present

## 2016-12-16 DIAGNOSIS — E876 Hypokalemia: Secondary | ICD-10-CM

## 2016-12-16 DIAGNOSIS — E039 Hypothyroidism, unspecified: Secondary | ICD-10-CM

## 2016-12-16 DIAGNOSIS — I10 Essential (primary) hypertension: Secondary | ICD-10-CM | POA: Diagnosis not present

## 2016-12-16 DIAGNOSIS — S32502D Unspecified fracture of left pubis, subsequent encounter for fracture with routine healing: Secondary | ICD-10-CM

## 2016-12-16 DIAGNOSIS — W19XXXD Unspecified fall, subsequent encounter: Secondary | ICD-10-CM | POA: Diagnosis not present

## 2016-12-16 DIAGNOSIS — E785 Hyperlipidemia, unspecified: Secondary | ICD-10-CM | POA: Diagnosis not present

## 2016-12-16 NOTE — Progress Notes (Signed)
Location:  [facility] Room Number: 101P Place of Service:  SNF (31) Noah Delaine. Sheppard Coil, MD  PCP: Colon Branch, MD Patient Care Team: Colon Branch, MD as PCP - General Ernesto Rutherford Maudry Mayhew, MD as Consulting Physician (Otolaryngology)  Extended Emergency Contact Information Primary Emergency Contact: [name] Address: [address]          [place] Phone: [phone] Relation: Son  Allergies  Allergen Reactions  . Ibuprofen     REACTION: bleed    Chief Complaint  Patient presents with  . Discharge Note    Discharged from SNF    HPI:  83 y.o. female with hypertension, hyperlipidemia, hypothyroidism, syncope, osteopenia, diverticulitis, breast cancer (s/p of lumpectomy 2003), who presents with fall. Patient stated that she tripped when she tried to open the door and missed handle at church, and fell,hitting cement at church. Reports that she hit the back of her head and left hip. She developed a severe pain in left hip, which is constant, 7 out of 10 in severity, nonradiating. No numbness or weakness in legs. Patient denies LOC. No chest pain, shortness rest, cough, nausea, vomiting, diarrhea, abdominal pain, symptoms of UTI or unilateral weakness. L hip and pelvis film showed fractures of l superior and inferior pubic rami, no hip fracture and pt was admitted to [facility] from 4/22-24 for pain control and supportive care. Pt was admitted to SNF for OT/PT and is now ready to be d/c to home.     Past Medical History:  Diagnosis Date  . Breast CA ([place])    left breast-invasive ductal ca stage 1-stopped arimidex 2008  . Diverticulitis of colon   . Hiatal hernia   . Hyperlipidemia   . Hypertension   . Hypothyroidism   . Osteoarthritis   . Osteopenia    DEXA 7/02  . Syncope 2005   CT head (-), ECHO essent. neg, stress test (-), carotid u/s (-)    Past Surgical History:  Procedure Laterality Date  . BREAST  LUMPECTOMY  04/2002   left  . TONSILLECTOMY AND ADENOIDECTOMY       reports that she has never smoked. She has never used smokeless tobacco. She reports that she does not drink alcohol or use drugs. Social History   Social History  . Marital status: Widowed    Spouse name: N/A  . Number of children: 2  . Years of education: N/A   Occupational History  . n/a Retired   Social History Main Topics  . Smoking status: Never Smoker  . Smokeless tobacco: Never Used  . Alcohol use No  . Drug use: No  . Sexual activity: Not on file   Other Topics Concern  . Not on file   Social History Narrative   Lost a son 57   Her other son lives w/ her on-off [name], h/o etoh, he is  one of my patients)   Sister live next door , does help the pt    G-son @ [employer] , G-daughter @ [employer] since 1993   still drives        Pertinent  Health Maintenance Due  Topic Date Due  . INFLUENZA VACCINE  03/09/2017  . MAMMOGRAM  05/27/2018  . DEXA SCAN  Completed  . PNA vac Low Risk Adult  Completed    Medications: Allergies as of 12/16/2016      Reactions   Ibuprofen  REACTION: bleed      Medication List       Accurate as of 12/16/16 11:52 AM. Always use your most recent med list.          acetaminophen 500 MG tablet Commonly known as:  TYLENOL Take 500 mg by mouth every 6 (six) hours as needed for mild pain, moderate pain or headache.   aspirin 81 MG tablet Take 81 mg by mouth every morning.   CALTRATE 600+D 600-400 MG-UNIT tablet Generic drug:  Calcium Carbonate-Vitamin D Take 1 tablet by mouth daily.   cyclobenzaprine 5 MG tablet Commonly known as:  FLEXERIL Take 1 tablet (5 mg total) by mouth at bedtime as needed for muscle spasms.   fluticasone 50 MCG/ACT nasal spray Commonly known as:  FLONASE Place 2 sprays into both nostrils daily as needed for allergies or rhinitis.   hydrochlorothiazide 25 MG tablet Commonly known as:  HYDRODIURIL Take 1  tablet (25 mg total) by mouth daily.   ICAPS AREDS 2 PO Take 1 capsule by mouth 2 (two) times daily.   levothyroxine 75 MCG tablet Commonly known as:  SYNTHROID, LEVOTHROID Take 1 tablet (75 mcg total) by mouth daily before breakfast.   oxyCODONE-acetaminophen 5-325 MG tablet Commonly known as:  PERCOCET/ROXICET Take 1 tablet by mouth every 6 (six) hours as needed for moderate pain.   polyethylene glycol packet Commonly known as:  MIRALAX / GLYCOLAX Take 17 g by mouth daily as needed for moderate constipation.   potassium chloride 10 MEQ tablet Commonly known as:  K-DUR Take 1 tablet (10 mEq total) by mouth daily.   rosuvastatin 5 MG tablet Commonly known as:  CRESTOR Take 1 tablet (5 mg total) by mouth daily.        Vitals:   12/16/16 0856  BP: 132/66  Pulse: 68  Resp: 18  Temp: 97.9 F (36.6 C)  SpO2: 92%  Weight: 147 lb 9.6 oz (67 kg)  Height: '5\' 3"'$  (1.6 m)   Body mass index is 26.15 kg/m.  Physical Exam  GENERAL APPEARANCE: Alert, conversant. No acute distress.  HEENT: Unremarkable. RESPIRATORY: Breathing is even, unlabored. Lung sounds are clear   CARDIOVASCULAR: Heart RRR no murmurs, rubs or gallops. No peripheral edema.  GASTROINTESTINAL: Abdomen is soft, non-tender, not distended w/ normal bowel sounds.  NEUROLOGIC: Cranial nerves 2-12 grossly intact. Moves all extremities   Labs reviewed: Basic Metabolic Panel:  Recent Labs  11/28/16 2040  11/29/16 0556 11/30/16 11/30/16 0656 12/06/16  NA 135  < > 135 131* 131* 140  K 3.0*  < > 3.4*  --  3.7 3.9  CL 96*  --  98*  --  94*  --   CO2 31  --  30  --  28  --   GLUCOSE 114*  --  112*  --  102*  --   BUN 14  < > '11 9 9 14  '$ CREATININE 0.75  < > 0.63 0.5 0.52 0.5  CALCIUM 9.1  --  8.8*  --  9.0  --   < > = values in this interval not displayed. No results found for: [facility] Liver Function Tests:  Recent Labs  07/20/16 0844  AST 17  ALT 8   No results for input(s): LIPASE, AMYLASE in  the last 8760 hours. No results for input(s): AMMONIA in the last 8760 hours. CBC:  Recent Labs  11/28/16 2040 11/29/16 11/29/16 0556 12/06/16  WBC 9.2 7.1 7.1 7.0  NEUTROABS 7.6  --   --   --  HGB 14.1  --  13.2 12.9  HCT 42.5  --  39.6 39  MCV 95.7  --  95.4  --   PLT 188  --  187 227   Lipid  Recent Labs  07/20/16 0844  CHOL 184  HDL 64.00  LDLCALC 103*  TRIG 88.0   Cardiac Enzymes: No results for input(s): CKTOTAL, CKMB, CKMBINDEX, TROPONINI in the last 8760 hours. BNP: No results for input(s): BNP in the last 8760 hours. CBG: No results for input(s): GLUCAP in the last 8760 hours.  Procedures and Imaging Studies During Stay: Ct Head Wo Contrast  Result Date: 11/28/2016 CLINICAL DATA:  Loss of balance with fall and left-sided neck pain. EXAM: CT HEAD WITHOUT CONTRAST CT CERVICAL SPINE WITHOUT CONTRAST TECHNIQUE: Multidetector CT imaging of the head and cervical spine was performed following the standard protocol without intravenous contrast. Multiplanar CT image reconstructions of the cervical spine were also generated. COMPARISON:  Head CT 09/23/2003 FINDINGS: CT HEAD FINDINGS Brain: No mass lesion, intraparenchymal hemorrhage or extra-axial collection. No evidence of acute cortical infarct. There is periventricular hypoattenuation compatible with chronic microvascular disease. Vascular: No hyperdense vessel or unexpected calcification. Skull: Normal visualized skull base, calvarium and extracranial soft tissues. Sinuses/Orbits: No sinus fluid levels or advanced mucosal thickening. No mastoid effusion. Normal orbits. CT CERVICAL SPINE FINDINGS Alignment: No acute static subluxation. Trace grade 1 anterolisthesis at C2-C3 and C4-C5 is unchanged. The facets are aligned. Occipital condyles are normally positioned. Skull base and vertebrae: No acute fracture. Soft tissues and spinal canal: No prevertebral fluid or swelling. No visible canal hematoma. Disc levels: There is severe  right C4-C5 facet hypertrophy and moderate bilateral C2-C3 facet hypertrophy. No bony spinal canal stenosis. Upper chest: No pneumothorax, pulmonary nodule or pleural effusion. Other: Normal visualized paraspinal cervical soft tissues. IMPRESSION: 1. No acute intracranial abnormality. 2. Chronic microvascular ischemia. 3. No acute fracture or acute static subluxation of the cervical spine. 4. Multilevel facet arthrosis resulting and degenerative grade 1 anterolisthesis at C2-C3 and C4-C5. Electronically Signed   By: Ulyses Jarred M.D.   On: 11/28/2016 20:50   Ct Cervical Spine Wo Contrast  Result Date: 11/28/2016 CLINICAL DATA:  Loss of balance with fall and left-sided neck pain. EXAM: CT HEAD WITHOUT CONTRAST CT CERVICAL SPINE WITHOUT CONTRAST TECHNIQUE: Multidetector CT imaging of the head and cervical spine was performed following the standard protocol without intravenous contrast. Multiplanar CT image reconstructions of the cervical spine were also generated. COMPARISON:  Head CT 09/23/2003 FINDINGS: CT HEAD FINDINGS Brain: No mass lesion, intraparenchymal hemorrhage or extra-axial collection. No evidence of acute cortical infarct. There is periventricular hypoattenuation compatible with chronic microvascular disease. Vascular: No hyperdense vessel or unexpected calcification. Skull: Normal visualized skull base, calvarium and extracranial soft tissues. Sinuses/Orbits: No sinus fluid levels or advanced mucosal thickening. No mastoid effusion. Normal orbits. CT CERVICAL SPINE FINDINGS Alignment: No acute static subluxation. Trace grade 1 anterolisthesis at C2-C3 and C4-C5 is unchanged. The facets are aligned. Occipital condyles are normally positioned. Skull base and vertebrae: No acute fracture. Soft tissues and spinal canal: No prevertebral fluid or swelling. No visible canal hematoma. Disc levels: There is severe right C4-C5 facet hypertrophy and moderate bilateral C2-C3 facet hypertrophy. No bony spinal  canal stenosis. Upper chest: No pneumothorax, pulmonary nodule or pleural effusion. Other: Normal visualized paraspinal cervical soft tissues. IMPRESSION: 1. No acute intracranial abnormality. 2. Chronic microvascular ischemia. 3. No acute fracture or acute static subluxation of the cervical spine. 4. Multilevel facet arthrosis resulting  and degenerative grade 1 anterolisthesis at C2-C3 and C4-C5. Electronically Signed   By: Ulyses Jarred M.D.   On: 11/28/2016 20:50   Dg Hip Unilat W Or Wo Pelvis 2-3 Views Left  Result Date: 11/28/2016 CLINICAL DATA:  83 year old female with history of trauma from a fall earlier this evening complaining of left hip pain. EXAM: DG HIP (WITH OR WITHOUT PELVIS) 2-3V LEFT COMPARISON:  No priors. FINDINGS: Multiple views of the lung bony pelvis in the left hip demonstrate acute minimally displaced fractures of the left superior and inferior pubic rami. Remaining portions of the pelvis appear grossly intact. Visualize left proximal femur is intact. Femoral heads appear located. Severe joint space narrowing, subchondral sclerosis, subchondral cyst formation and osteophyte formation is noted in the hip joints bilaterally, compatible with advanced osteoarthritis. IMPRESSION: 1. Acute minimally displaced fractures of the left superior and inferior pubic rami. 2. Severe bilateral hip joint osteoarthritis, as above. Electronically Signed   By: Vinnie Langton M.D.   On: 11/28/2016 20:33    Assessment/Plan:   Closed displaced fracture of left pubis, initial encounter ([place])  Closed fracture of multiple rami of left pubis with routine healing, subsequent encounter  Fall, subsequent encounter  Hypokalemia  Essential hypertension  Hypothyroidism, unspecified type  Hyperlipidemia, unspecified hyperlipidemia type   Patient is being discharged with the following home health services:  OT/PT/Nursing  Patient is being discharged with the following durable medical equipment:  rolling walker   Patient has been advised to f/u with their PCP in 1-2 weeks to bring them up to date on their rehab stay.  Social services at facility was responsible for arranging this appointment.  Pt was provided with a 30 day supply of prescriptions for medications and refills must be obtained from their PCP.  For controlled substances, a more limited supply may be provided adequate until PCP appointment only.  MEDICATIONS HAVE BEEN RECONCILED. tIME SPENT . 30 MIN;> 50% of time with patient was spent reviewing records, labs, tests and studies, counseling and developing plan of care    Noah Delaine. Sheppard Coil, MD

## 2016-12-16 NOTE — Telephone Encounter (Signed)
AWV scheduled 01/18/17 '@8'$ 

## 2016-12-17 ENCOUNTER — Telehealth: Payer: Self-pay | Admitting: Internal Medicine

## 2016-12-17 NOTE — Telephone Encounter (Signed)
Just an FYI

## 2016-12-17 NOTE — Telephone Encounter (Signed)
Caller name: Legrand Como  Relation to pt: Development worker, international aid  Call back number: [phone]   Reason for call:  Clinical Manager wanted to inform PCP due to staffing over the weekend, nursing assistance will start on Tuesday wanted to make PCP aware.

## 2016-12-20 ENCOUNTER — Ambulatory Visit: Payer: Medicare HMO | Admitting: Internal Medicine

## 2016-12-20 NOTE — Telephone Encounter (Signed)
noted 

## 2016-12-21 ENCOUNTER — Telehealth: Payer: Self-pay | Admitting: Internal Medicine

## 2016-12-21 DIAGNOSIS — R2689 Other abnormalities of gait and mobility: Secondary | ICD-10-CM | POA: Diagnosis not present

## 2016-12-21 DIAGNOSIS — R278 Other lack of coordination: Secondary | ICD-10-CM | POA: Diagnosis not present

## 2016-12-21 DIAGNOSIS — M1990 Unspecified osteoarthritis, unspecified site: Secondary | ICD-10-CM | POA: Diagnosis not present

## 2016-12-21 DIAGNOSIS — I10 Essential (primary) hypertension: Secondary | ICD-10-CM | POA: Diagnosis not present

## 2016-12-21 DIAGNOSIS — M6281 Muscle weakness (generalized): Secondary | ICD-10-CM | POA: Diagnosis not present

## 2016-12-21 DIAGNOSIS — W19XXXD Unspecified fall, subsequent encounter: Secondary | ICD-10-CM | POA: Diagnosis not present

## 2016-12-21 DIAGNOSIS — S329XXD Fracture of unspecified parts of lumbosacral spine and pelvis, subsequent encounter for fracture with routine healing: Secondary | ICD-10-CM | POA: Diagnosis not present

## 2016-12-21 NOTE — Telephone Encounter (Signed)
Caller name:  Morey Hummingbird Relationship to patient: [facility] Can be reached: [phone] Pharmacy:  Reason for call: Need verbal order for Nursing 2 times a week for 2 weeks for medication management

## 2016-12-21 NOTE — Telephone Encounter (Signed)
Spoke w/ Morey Hummingbird, verbal orders given.

## 2016-12-22 DIAGNOSIS — S329XXD Fracture of unspecified parts of lumbosacral spine and pelvis, subsequent encounter for fracture with routine healing: Secondary | ICD-10-CM | POA: Diagnosis not present

## 2016-12-22 DIAGNOSIS — I10 Essential (primary) hypertension: Secondary | ICD-10-CM | POA: Diagnosis not present

## 2016-12-22 DIAGNOSIS — M6281 Muscle weakness (generalized): Secondary | ICD-10-CM | POA: Diagnosis not present

## 2016-12-22 DIAGNOSIS — M1990 Unspecified osteoarthritis, unspecified site: Secondary | ICD-10-CM | POA: Diagnosis not present

## 2016-12-22 DIAGNOSIS — W19XXXD Unspecified fall, subsequent encounter: Secondary | ICD-10-CM | POA: Diagnosis not present

## 2016-12-22 DIAGNOSIS — R278 Other lack of coordination: Secondary | ICD-10-CM | POA: Diagnosis not present

## 2016-12-22 DIAGNOSIS — R2689 Other abnormalities of gait and mobility: Secondary | ICD-10-CM | POA: Diagnosis not present

## 2016-12-23 ENCOUNTER — Telehealth: Payer: Self-pay | Admitting: Internal Medicine

## 2016-12-23 DIAGNOSIS — R278 Other lack of coordination: Secondary | ICD-10-CM | POA: Diagnosis not present

## 2016-12-23 DIAGNOSIS — W19XXXD Unspecified fall, subsequent encounter: Secondary | ICD-10-CM | POA: Diagnosis not present

## 2016-12-23 DIAGNOSIS — M1990 Unspecified osteoarthritis, unspecified site: Secondary | ICD-10-CM | POA: Diagnosis not present

## 2016-12-23 DIAGNOSIS — S329XXD Fracture of unspecified parts of lumbosacral spine and pelvis, subsequent encounter for fracture with routine healing: Secondary | ICD-10-CM | POA: Diagnosis not present

## 2016-12-23 DIAGNOSIS — I10 Essential (primary) hypertension: Secondary | ICD-10-CM | POA: Diagnosis not present

## 2016-12-23 DIAGNOSIS — M6281 Muscle weakness (generalized): Secondary | ICD-10-CM | POA: Diagnosis not present

## 2016-12-23 DIAGNOSIS — R2689 Other abnormalities of gait and mobility: Secondary | ICD-10-CM | POA: Diagnosis not present

## 2016-12-23 NOTE — Telephone Encounter (Signed)
Is ok.

## 2016-12-23 NOTE — Telephone Encounter (Signed)
Please advise.//AB/CMA 

## 2016-12-23 NOTE — Telephone Encounter (Signed)
[employer] and spoke with Deidie and gave her verbal orders per Dr. [name] for PT 2x 4.//AB/CMA

## 2016-12-23 NOTE — Telephone Encounter (Signed)
°  Relation to pt: PT from [facility]   Call back number: [phone]    Reason for call:  Verbal orders for PT 2x 4

## 2016-12-24 ENCOUNTER — Telehealth: Payer: Self-pay | Admitting: Internal Medicine

## 2016-12-24 ENCOUNTER — Other Ambulatory Visit: Payer: Self-pay | Admitting: Emergency Medicine

## 2016-12-24 ENCOUNTER — Encounter: Payer: Self-pay | Admitting: Internal Medicine

## 2016-12-24 DIAGNOSIS — M6281 Muscle weakness (generalized): Secondary | ICD-10-CM | POA: Diagnosis not present

## 2016-12-24 DIAGNOSIS — W19XXXD Unspecified fall, subsequent encounter: Secondary | ICD-10-CM | POA: Diagnosis not present

## 2016-12-24 DIAGNOSIS — R278 Other lack of coordination: Secondary | ICD-10-CM | POA: Diagnosis not present

## 2016-12-24 DIAGNOSIS — M1990 Unspecified osteoarthritis, unspecified site: Secondary | ICD-10-CM | POA: Diagnosis not present

## 2016-12-24 DIAGNOSIS — S329XXD Fracture of unspecified parts of lumbosacral spine and pelvis, subsequent encounter for fracture with routine healing: Secondary | ICD-10-CM | POA: Diagnosis not present

## 2016-12-24 DIAGNOSIS — R2689 Other abnormalities of gait and mobility: Secondary | ICD-10-CM | POA: Diagnosis not present

## 2016-12-24 DIAGNOSIS — I10 Essential (primary) hypertension: Secondary | ICD-10-CM | POA: Diagnosis not present

## 2016-12-24 NOTE — Telephone Encounter (Signed)
Called back to give verbal orders. Per Emmit Alexanders with [facility] OT the orders should actually be  OT 1 time for 1 week then next week 2 times for 3 weeks.

## 2016-12-24 NOTE — Telephone Encounter (Signed)
error:[id number] ° °

## 2016-12-24 NOTE — Telephone Encounter (Signed)
Caller name: Emmit Alexanders Relationship to patient: [facility] OT Can be reached: [phone] Pharmacy:  Reason for call: Request verbal order for OT 2 times a week for 3 weeks

## 2016-12-25 DIAGNOSIS — R2689 Other abnormalities of gait and mobility: Secondary | ICD-10-CM | POA: Diagnosis not present

## 2016-12-25 DIAGNOSIS — S329XXD Fracture of unspecified parts of lumbosacral spine and pelvis, subsequent encounter for fracture with routine healing: Secondary | ICD-10-CM | POA: Diagnosis not present

## 2016-12-25 DIAGNOSIS — W19XXXD Unspecified fall, subsequent encounter: Secondary | ICD-10-CM | POA: Diagnosis not present

## 2016-12-25 DIAGNOSIS — M6281 Muscle weakness (generalized): Secondary | ICD-10-CM | POA: Diagnosis not present

## 2016-12-25 DIAGNOSIS — I10 Essential (primary) hypertension: Secondary | ICD-10-CM | POA: Diagnosis not present

## 2016-12-25 DIAGNOSIS — R278 Other lack of coordination: Secondary | ICD-10-CM | POA: Diagnosis not present

## 2016-12-25 DIAGNOSIS — M1990 Unspecified osteoarthritis, unspecified site: Secondary | ICD-10-CM | POA: Diagnosis not present

## 2016-12-27 DIAGNOSIS — R2689 Other abnormalities of gait and mobility: Secondary | ICD-10-CM | POA: Diagnosis not present

## 2016-12-27 DIAGNOSIS — S329XXD Fracture of unspecified parts of lumbosacral spine and pelvis, subsequent encounter for fracture with routine healing: Secondary | ICD-10-CM | POA: Diagnosis not present

## 2016-12-27 DIAGNOSIS — I10 Essential (primary) hypertension: Secondary | ICD-10-CM | POA: Diagnosis not present

## 2016-12-27 DIAGNOSIS — M6281 Muscle weakness (generalized): Secondary | ICD-10-CM | POA: Diagnosis not present

## 2016-12-27 DIAGNOSIS — M1990 Unspecified osteoarthritis, unspecified site: Secondary | ICD-10-CM | POA: Diagnosis not present

## 2016-12-27 DIAGNOSIS — W19XXXD Unspecified fall, subsequent encounter: Secondary | ICD-10-CM | POA: Diagnosis not present

## 2016-12-27 DIAGNOSIS — R278 Other lack of coordination: Secondary | ICD-10-CM | POA: Diagnosis not present

## 2016-12-28 DIAGNOSIS — R2689 Other abnormalities of gait and mobility: Secondary | ICD-10-CM | POA: Diagnosis not present

## 2016-12-28 DIAGNOSIS — M6281 Muscle weakness (generalized): Secondary | ICD-10-CM | POA: Diagnosis not present

## 2016-12-28 DIAGNOSIS — I10 Essential (primary) hypertension: Secondary | ICD-10-CM | POA: Diagnosis not present

## 2016-12-28 DIAGNOSIS — R278 Other lack of coordination: Secondary | ICD-10-CM | POA: Diagnosis not present

## 2016-12-28 DIAGNOSIS — M1990 Unspecified osteoarthritis, unspecified site: Secondary | ICD-10-CM | POA: Diagnosis not present

## 2016-12-28 DIAGNOSIS — W19XXXD Unspecified fall, subsequent encounter: Secondary | ICD-10-CM | POA: Diagnosis not present

## 2016-12-28 DIAGNOSIS — S329XXD Fracture of unspecified parts of lumbosacral spine and pelvis, subsequent encounter for fracture with routine healing: Secondary | ICD-10-CM | POA: Diagnosis not present

## 2016-12-30 ENCOUNTER — Other Ambulatory Visit: Payer: Self-pay | Admitting: Internal Medicine

## 2016-12-30 DIAGNOSIS — M6281 Muscle weakness (generalized): Secondary | ICD-10-CM | POA: Diagnosis not present

## 2016-12-30 DIAGNOSIS — R2689 Other abnormalities of gait and mobility: Secondary | ICD-10-CM | POA: Diagnosis not present

## 2016-12-30 DIAGNOSIS — I10 Essential (primary) hypertension: Secondary | ICD-10-CM | POA: Diagnosis not present

## 2016-12-30 DIAGNOSIS — R278 Other lack of coordination: Secondary | ICD-10-CM | POA: Diagnosis not present

## 2016-12-30 DIAGNOSIS — W19XXXD Unspecified fall, subsequent encounter: Secondary | ICD-10-CM | POA: Diagnosis not present

## 2016-12-30 DIAGNOSIS — S329XXD Fracture of unspecified parts of lumbosacral spine and pelvis, subsequent encounter for fracture with routine healing: Secondary | ICD-10-CM | POA: Diagnosis not present

## 2016-12-30 DIAGNOSIS — M1990 Unspecified osteoarthritis, unspecified site: Secondary | ICD-10-CM | POA: Diagnosis not present

## 2016-12-31 DIAGNOSIS — M1990 Unspecified osteoarthritis, unspecified site: Secondary | ICD-10-CM | POA: Diagnosis not present

## 2016-12-31 DIAGNOSIS — S329XXD Fracture of unspecified parts of lumbosacral spine and pelvis, subsequent encounter for fracture with routine healing: Secondary | ICD-10-CM | POA: Diagnosis not present

## 2016-12-31 DIAGNOSIS — I10 Essential (primary) hypertension: Secondary | ICD-10-CM | POA: Diagnosis not present

## 2016-12-31 DIAGNOSIS — M6281 Muscle weakness (generalized): Secondary | ICD-10-CM | POA: Diagnosis not present

## 2016-12-31 DIAGNOSIS — R2689 Other abnormalities of gait and mobility: Secondary | ICD-10-CM | POA: Diagnosis not present

## 2016-12-31 DIAGNOSIS — W19XXXD Unspecified fall, subsequent encounter: Secondary | ICD-10-CM | POA: Diagnosis not present

## 2016-12-31 DIAGNOSIS — R278 Other lack of coordination: Secondary | ICD-10-CM | POA: Diagnosis not present

## 2017-01-04 ENCOUNTER — Ambulatory Visit (INDEPENDENT_AMBULATORY_CARE_PROVIDER_SITE_OTHER): Payer: Medicare HMO | Admitting: Podiatry

## 2017-01-04 DIAGNOSIS — B351 Tinea unguium: Secondary | ICD-10-CM | POA: Diagnosis not present

## 2017-01-04 DIAGNOSIS — Q828 Other specified congenital malformations of skin: Secondary | ICD-10-CM | POA: Diagnosis not present

## 2017-01-04 DIAGNOSIS — M1990 Unspecified osteoarthritis, unspecified site: Secondary | ICD-10-CM | POA: Diagnosis not present

## 2017-01-04 DIAGNOSIS — M79676 Pain in unspecified toe(s): Secondary | ICD-10-CM | POA: Diagnosis not present

## 2017-01-04 DIAGNOSIS — W19XXXD Unspecified fall, subsequent encounter: Secondary | ICD-10-CM | POA: Diagnosis not present

## 2017-01-04 DIAGNOSIS — R278 Other lack of coordination: Secondary | ICD-10-CM | POA: Diagnosis not present

## 2017-01-04 DIAGNOSIS — M6281 Muscle weakness (generalized): Secondary | ICD-10-CM | POA: Diagnosis not present

## 2017-01-04 DIAGNOSIS — I10 Essential (primary) hypertension: Secondary | ICD-10-CM | POA: Diagnosis not present

## 2017-01-04 DIAGNOSIS — R2689 Other abnormalities of gait and mobility: Secondary | ICD-10-CM | POA: Diagnosis not present

## 2017-01-04 DIAGNOSIS — S329XXD Fracture of unspecified parts of lumbosacral spine and pelvis, subsequent encounter for fracture with routine healing: Secondary | ICD-10-CM | POA: Diagnosis not present

## 2017-01-04 NOTE — Progress Notes (Signed)
Subjective: 83 y.o. returns the office today for painful, elongated, thickened toenails which she cannot trim herself. Denies any redness or drainage around the nails. She also gets painful calluses to her feet. She has partially had a fall however she is back at home after been in rehabilitation and she fractured her pelvis. Denies any lower extremity injury at the time.  Denies any acute changes since last appointment and no new complaints today. Denies any systemic complaints such as fevers, chills, nausea, vomiting.   Objective: AAO 3, NAD DP/PT pulses palpable, CRT less than 3 seconds Nails hypertrophic, dystrophic, elongated, brittle, discolored 10. There is tenderness overlying the nails 1-5 bilaterally. There is no surrounding erythema or drainage along the nail sites. Hyperkeratotic lesions bilateral submetatarsal 1/5. Upon debridement no underlying ulceration, drainage or any clinical signs of infection. No open lesions or pre-ulcerative lesions are identified. No other areas of tenderness bilateral lower extremities. No overlying edema, erythema, increased warmth. No pain with calf compression, swelling, warmth, erythema. Walks with a walker without a limp or any apparent pain.   Assessment: Patient presents with symptomatic onychomycosis; hyperkeratotic lesions  Plan: -Treatment options including alternatives, risks, complications were discussed -Nails sharply debrided 10 without complication/bleeding. -Hyperkeratotic lesion sharp debrided 4 without complications or bleeding. -Discussed daily foot inspection. If there are any changes, to call the office immediately.  -Follow-up in 3 months or sooner if any problems are to arise. In the meantime, encouraged to call the office with any questions, concerns, changes symptoms.  Celesta Gentile, DPM

## 2017-01-05 DIAGNOSIS — R2689 Other abnormalities of gait and mobility: Secondary | ICD-10-CM | POA: Diagnosis not present

## 2017-01-05 DIAGNOSIS — S329XXD Fracture of unspecified parts of lumbosacral spine and pelvis, subsequent encounter for fracture with routine healing: Secondary | ICD-10-CM | POA: Diagnosis not present

## 2017-01-05 DIAGNOSIS — R278 Other lack of coordination: Secondary | ICD-10-CM | POA: Diagnosis not present

## 2017-01-05 DIAGNOSIS — W19XXXD Unspecified fall, subsequent encounter: Secondary | ICD-10-CM | POA: Diagnosis not present

## 2017-01-05 DIAGNOSIS — M1990 Unspecified osteoarthritis, unspecified site: Secondary | ICD-10-CM | POA: Diagnosis not present

## 2017-01-05 DIAGNOSIS — M6281 Muscle weakness (generalized): Secondary | ICD-10-CM | POA: Diagnosis not present

## 2017-01-05 DIAGNOSIS — I10 Essential (primary) hypertension: Secondary | ICD-10-CM | POA: Diagnosis not present

## 2017-01-06 DIAGNOSIS — M6281 Muscle weakness (generalized): Secondary | ICD-10-CM | POA: Diagnosis not present

## 2017-01-06 DIAGNOSIS — W19XXXD Unspecified fall, subsequent encounter: Secondary | ICD-10-CM | POA: Diagnosis not present

## 2017-01-06 DIAGNOSIS — R2689 Other abnormalities of gait and mobility: Secondary | ICD-10-CM | POA: Diagnosis not present

## 2017-01-06 DIAGNOSIS — R278 Other lack of coordination: Secondary | ICD-10-CM | POA: Diagnosis not present

## 2017-01-06 DIAGNOSIS — M1990 Unspecified osteoarthritis, unspecified site: Secondary | ICD-10-CM | POA: Diagnosis not present

## 2017-01-06 DIAGNOSIS — S329XXD Fracture of unspecified parts of lumbosacral spine and pelvis, subsequent encounter for fracture with routine healing: Secondary | ICD-10-CM | POA: Diagnosis not present

## 2017-01-06 DIAGNOSIS — I10 Essential (primary) hypertension: Secondary | ICD-10-CM | POA: Diagnosis not present

## 2017-01-07 ENCOUNTER — Telehealth: Payer: Self-pay | Admitting: Internal Medicine

## 2017-01-07 DIAGNOSIS — M8580 Other specified disorders of bone density and structure, unspecified site: Secondary | ICD-10-CM | POA: Diagnosis not present

## 2017-01-07 DIAGNOSIS — I10 Essential (primary) hypertension: Secondary | ICD-10-CM | POA: Diagnosis not present

## 2017-01-07 DIAGNOSIS — M6281 Muscle weakness (generalized): Secondary | ICD-10-CM | POA: Diagnosis not present

## 2017-01-07 DIAGNOSIS — W19XXXD Unspecified fall, subsequent encounter: Secondary | ICD-10-CM | POA: Diagnosis not present

## 2017-01-07 DIAGNOSIS — M160 Bilateral primary osteoarthritis of hip: Secondary | ICD-10-CM | POA: Diagnosis not present

## 2017-01-07 DIAGNOSIS — S3282XD Multiple fractures of pelvis without disruption of pelvic ring, subsequent encounter for fracture with routine healing: Secondary | ICD-10-CM | POA: Diagnosis not present

## 2017-01-07 DIAGNOSIS — Z853 Personal history of malignant neoplasm of breast: Secondary | ICD-10-CM | POA: Diagnosis not present

## 2017-01-07 DIAGNOSIS — Z7982 Long term (current) use of aspirin: Secondary | ICD-10-CM | POA: Diagnosis not present

## 2017-01-07 NOTE — Telephone Encounter (Signed)
Caller name: Langley Gauss with [facility] OT Can be reached: [phone]  Reason for call: called to notify pts OT will dc as scheduled on Tues 01/11/17

## 2017-01-07 NOTE — Telephone Encounter (Signed)
Just an FYI

## 2017-01-07 NOTE — Telephone Encounter (Signed)
noted 

## 2017-01-10 DIAGNOSIS — M6281 Muscle weakness (generalized): Secondary | ICD-10-CM | POA: Diagnosis not present

## 2017-01-10 DIAGNOSIS — S3282XD Multiple fractures of pelvis without disruption of pelvic ring, subsequent encounter for fracture with routine healing: Secondary | ICD-10-CM | POA: Diagnosis not present

## 2017-01-10 DIAGNOSIS — W19XXXD Unspecified fall, subsequent encounter: Secondary | ICD-10-CM | POA: Diagnosis not present

## 2017-01-10 DIAGNOSIS — I10 Essential (primary) hypertension: Secondary | ICD-10-CM | POA: Diagnosis not present

## 2017-01-10 DIAGNOSIS — S32810D Multiple fractures of pelvis with stable disruption of pelvic ring, subsequent encounter for fracture with routine healing: Secondary | ICD-10-CM | POA: Diagnosis not present

## 2017-01-10 DIAGNOSIS — M160 Bilateral primary osteoarthritis of hip: Secondary | ICD-10-CM | POA: Diagnosis not present

## 2017-01-10 DIAGNOSIS — Z7982 Long term (current) use of aspirin: Secondary | ICD-10-CM | POA: Diagnosis not present

## 2017-01-10 DIAGNOSIS — Z853 Personal history of malignant neoplasm of breast: Secondary | ICD-10-CM | POA: Diagnosis not present

## 2017-01-10 DIAGNOSIS — M8580 Other specified disorders of bone density and structure, unspecified site: Secondary | ICD-10-CM | POA: Diagnosis not present

## 2017-01-11 ENCOUNTER — Telehealth: Payer: Self-pay | Admitting: Internal Medicine

## 2017-01-11 DIAGNOSIS — M6281 Muscle weakness (generalized): Secondary | ICD-10-CM | POA: Diagnosis not present

## 2017-01-11 DIAGNOSIS — Z853 Personal history of malignant neoplasm of breast: Secondary | ICD-10-CM | POA: Diagnosis not present

## 2017-01-11 DIAGNOSIS — W19XXXD Unspecified fall, subsequent encounter: Secondary | ICD-10-CM | POA: Diagnosis not present

## 2017-01-11 DIAGNOSIS — S3282XD Multiple fractures of pelvis without disruption of pelvic ring, subsequent encounter for fracture with routine healing: Secondary | ICD-10-CM | POA: Diagnosis not present

## 2017-01-11 DIAGNOSIS — M160 Bilateral primary osteoarthritis of hip: Secondary | ICD-10-CM | POA: Diagnosis not present

## 2017-01-11 DIAGNOSIS — Z7982 Long term (current) use of aspirin: Secondary | ICD-10-CM | POA: Diagnosis not present

## 2017-01-11 DIAGNOSIS — I10 Essential (primary) hypertension: Secondary | ICD-10-CM | POA: Diagnosis not present

## 2017-01-11 DIAGNOSIS — M8580 Other specified disorders of bone density and structure, unspecified site: Secondary | ICD-10-CM | POA: Diagnosis not present

## 2017-01-11 NOTE — Telephone Encounter (Signed)
Just an FYI

## 2017-01-11 NOTE — Telephone Encounter (Signed)
Caller name:Deandra   Call back number: PT from [facility]: [phone]   Reason for call:  Wanted to inform PCP patient is being discharged from PT due to patient reaching there goals.

## 2017-01-11 NOTE — Telephone Encounter (Signed)
thx

## 2017-01-13 DIAGNOSIS — S3282XD Multiple fractures of pelvis without disruption of pelvic ring, subsequent encounter for fracture with routine healing: Secondary | ICD-10-CM | POA: Diagnosis not present

## 2017-01-13 DIAGNOSIS — I10 Essential (primary) hypertension: Secondary | ICD-10-CM | POA: Diagnosis not present

## 2017-01-13 DIAGNOSIS — M160 Bilateral primary osteoarthritis of hip: Secondary | ICD-10-CM | POA: Diagnosis not present

## 2017-01-13 DIAGNOSIS — M6281 Muscle weakness (generalized): Secondary | ICD-10-CM | POA: Diagnosis not present

## 2017-01-13 DIAGNOSIS — Z853 Personal history of malignant neoplasm of breast: Secondary | ICD-10-CM | POA: Diagnosis not present

## 2017-01-13 DIAGNOSIS — Z7982 Long term (current) use of aspirin: Secondary | ICD-10-CM | POA: Diagnosis not present

## 2017-01-13 DIAGNOSIS — W19XXXD Unspecified fall, subsequent encounter: Secondary | ICD-10-CM | POA: Diagnosis not present

## 2017-01-13 DIAGNOSIS — M8580 Other specified disorders of bone density and structure, unspecified site: Secondary | ICD-10-CM | POA: Diagnosis not present

## 2017-01-14 NOTE — Progress Notes (Deleted)
Subjective:   Ariel Medina is a 83 y.o. female who presents for Medicare Annual (Subsequent) preventive examination.  Review of Systems:  No ROS.  Medicare Wellness Visit.    Sleep patterns:    Home Safety/Smoke Alarms: Feels safe in home. Smoke alarms in place.  Living environment; residence and Firearm Safety:  [place] Safety/Bike Helmet: Wears seat belt.   Counseling:   Eye Exam-  Dental-  Female:   Pap-   No longer doing routine screening due to age.    Mammo- Last 05/27/16: BI-RADS CATEGORY  1: Negative.      Dexa scan- Last 03/04/15: osteopenia       CCS- No longer doing routine screening due to age.     Objective:     Vitals: There were no vitals taken for this visit.  There is no height or weight on file to calculate BMI.   Tobacco History  Smoking Status  . Never Smoker  Smokeless Tobacco  . Never Used     Counseling given: Not Answered   Past Medical History:  Diagnosis Date  . Breast CA ([place])    left breast-invasive ductal ca stage 1-stopped arimidex 2008  . Diverticulitis of colon   . Hiatal hernia   . Hyperlipidemia   . Hypertension   . Hypothyroidism   . Osteoarthritis   . Osteopenia    DEXA 7/02  . Syncope 2005   CT head (-), ECHO essent. neg, stress test (-), carotid u/s (-)   Past Surgical History:  Procedure Laterality Date  . BREAST LUMPECTOMY  04/2002   left  . TONSILLECTOMY AND ADENOIDECTOMY     Family History  Problem Relation Age of Onset  . Heart attack Brother 77  . Diabetes Mother   . Stroke Father 63  . Stroke Sister 74  . Colon cancer Neg Hx   . Breast cancer Neg Hx    History  Sexual Activity  . Sexual activity: Not on file    Outpatient Encounter Prescriptions as of 01/18/2017  Medication Sig  . acetaminophen (TYLENOL) 500 MG tablet Take 500 mg by mouth every 6 (six) hours as needed for mild pain, moderate pain or headache.   Marland Kitchen aspirin 81 MG tablet Take 81 mg by mouth every morning.   . Calcium  Carbonate-Vitamin D (CALTRATE 600+D) 600-400 MG-UNIT tablet Take 1 tablet by mouth daily.  . cyclobenzaprine (FLEXERIL) 5 MG tablet Take 1 tablet (5 mg total) by mouth at bedtime as needed for muscle spasms.  . fluticasone (FLONASE) 50 MCG/ACT nasal spray Place 2 sprays into both nostrils daily as needed for allergies or rhinitis.  . hydrochlorothiazide (HYDRODIURIL) 25 MG tablet Take 1 tablet (25 mg total) by mouth daily.  Marland Kitchen levothyroxine (SYNTHROID, LEVOTHROID) 75 MCG tablet Take 1 tablet (75 mcg total) by mouth daily before breakfast.  . Multiple Vitamins-Minerals (ICAPS AREDS 2 PO) Take 1 capsule by mouth 2 (two) times daily.  Marland Kitchen oxyCODONE-acetaminophen (PERCOCET/ROXICET) 5-325 MG tablet Take 1 tablet by mouth every 6 (six) hours as needed for moderate pain.  . polyethylene glycol (MIRALAX / GLYCOLAX) packet Take 17 g by mouth daily as needed for moderate constipation.  . potassium chloride (K-DUR) 10 MEQ tablet Take 1 tablet (10 mEq total) by mouth daily.  . rosuvastatin (CRESTOR) 5 MG tablet Take 1 tablet (5 mg total) by mouth daily.   No facility-administered encounter medications on file as of 01/18/2017.     Activities of Daily Living In your present  state of health, do you have any difficulty performing the following activities: 11/29/2016 07/20/2016  Hearing? N N  Vision? N N  Difficulty concentrating or making decisions? N N  Walking or climbing stairs? Y Y  Dressing or bathing? Y N  Doing errands, shopping? N N  Some recent data might be hidden    Patient Care Team: Colon Branch, MD as PCP - General Ernesto Rutherford Maudry Mayhew, MD as Consulting Physician (Otolaryngology)    Assessment:    Physical assessment deferred to PCP.  Exercise Activities and Dietary recommendations   Diet (meal preparation, eat out, water intake, caffeinated beverages, dairy products, fruits and vegetables): {Desc; diets:16563} Breakfast: Lunch:  Dinner:      Goals    None     Fall Risk Fall Risk   07/20/2016 05/13/2015 06/10/2014 06/07/2013  Falls in the past year? No No No No   Depression Screen PHQ 2/9 Scores 07/20/2016 05/13/2015 06/10/2014 06/07/2013  PHQ - 2 Score 0 0 0 0     Cognitive Function        Immunization History  Administered Date(s) Administered  . H1N1 08/29/2008  . Influenza Whole 05/12/2010  . Influenza, High Dose Seasonal PF 06/07/2013, 05/13/2015, 07/20/2016  . Influenza,inj,Quad PF,36+ Mos 06/10/2014  . PPD Test 12/14/2016  . Pneumococcal Conjugate-13 06/10/2014  . Pneumococcal Polysaccharide-23 07/10/2003, 02/21/2008  . Td 05/16/2007  . Zoster 12/13/2012   Screening Tests Health Maintenance  Topic Date Due  . INFLUENZA VACCINE  03/09/2017  . TETANUS/TDAP  05/15/2017  . MAMMOGRAM  05/27/2018  . DEXA SCAN  Completed  . PNA vac Low Risk Adult  Completed      Plan:   ***   I have personally reviewed and noted the following in the patient's chart:   . Medical and social history . Use of alcohol, tobacco or illicit drugs  . Current medications and supplements . Functional ability and status . Nutritional status . Physical activity . Advanced directives . List of other physicians . Hospitalizations, surgeries, and ER visits in previous 12 months . Vitals . Screenings to include cognitive, depression, and falls . Referrals and appointments  In addition, I have reviewed and discussed with patient certain preventive protocols, quality metrics, and best practice recommendations. A written personalized care plan for preventive services as well as general preventive health recommendations were provided to patient.     Shela Nevin, [place]  01/14/2017

## 2017-01-18 ENCOUNTER — Ambulatory Visit: Payer: Commercial Managed Care - HMO | Admitting: Internal Medicine

## 2017-01-18 ENCOUNTER — Ambulatory Visit (INDEPENDENT_AMBULATORY_CARE_PROVIDER_SITE_OTHER): Payer: Medicare HMO | Admitting: Internal Medicine

## 2017-01-18 ENCOUNTER — Ambulatory Visit (HOSPITAL_BASED_OUTPATIENT_CLINIC_OR_DEPARTMENT_OTHER)
Admission: RE | Admit: 2017-01-18 | Discharge: 2017-01-18 | Disposition: A | Discharge status: Home / Self Care | Payer: Medicare HMO | Source: Ambulatory Visit | Attending: Internal Medicine | Admitting: Internal Medicine

## 2017-01-18 ENCOUNTER — Encounter: Payer: Self-pay | Admitting: Internal Medicine

## 2017-01-18 VITALS — BP 124/66 | HR 66 | Temp 98.1°F | Resp 14 | Ht 63.0 in | Wt 135.5 lb

## 2017-01-18 DIAGNOSIS — E039 Hypothyroidism, unspecified: Secondary | ICD-10-CM

## 2017-01-18 DIAGNOSIS — I10 Essential (primary) hypertension: Secondary | ICD-10-CM

## 2017-01-18 DIAGNOSIS — M7989 Other specified soft tissue disorders: Secondary | ICD-10-CM

## 2017-01-18 DIAGNOSIS — M7121 Synovial cyst of popliteal space [Baker], right knee: Secondary | ICD-10-CM | POA: Insufficient documentation

## 2017-01-18 LAB — CBC WITH DIFFERENTIAL/PLATELET
BASOS ABS: 0 10*3/uL (ref 0.0–0.1)
BASOS PCT: 0.6 % (ref 0.0–3.0)
EOS ABS: 0.1 10*3/uL (ref 0.0–0.7)
Eosinophils Relative: 1.5 % (ref 0.0–5.0)
HCT: 43.5 % (ref 36.0–46.0)
Hemoglobin: 14.8 g/dL (ref 12.0–15.0)
LYMPHS PCT: 27.7 % (ref 12.0–46.0)
Lymphs Abs: 1.5 10*3/uL (ref 0.7–4.0)
MCHC: 33.9 g/dL (ref 30.0–36.0)
MCV: 94.1 fl (ref 78.0–100.0)
MONO ABS: 0.6 10*3/uL (ref 0.1–1.0)
Monocytes Relative: 10.5 % (ref 3.0–12.0)
NEUTROS ABS: 3.2 10*3/uL (ref 1.4–7.7)
Neutrophils Relative %: 59.7 % (ref 43.0–77.0)
Platelets: 317 10*3/uL (ref 150.0–400.0)
RBC: 4.62 Mil/uL (ref 3.87–5.11)
RDW: 13.4 % (ref 11.5–15.5)
WBC: 5.4 10*3/uL (ref 4.0–10.5)

## 2017-01-18 LAB — BASIC METABOLIC PANEL
BUN: 11 mg/dL (ref 6–23)
CALCIUM: 9.8 mg/dL (ref 8.4–10.5)
CHLORIDE: 100 meq/L (ref 96–112)
CO2: 36 meq/L — AB (ref 19–32)
CREATININE: 0.69 mg/dL (ref 0.40–1.20)
GFR: 85.58 mL/min (ref >60.00)
Glucose, Bld: 84 mg/dL (ref 70–99)
Potassium: 3.6 mEq/L (ref 3.5–5.1)
Sodium: 140 mEq/L (ref 135–145)

## 2017-01-18 LAB — TSH: TSH: 2.64 u[IU]/mL (ref 0.35–4.50)

## 2017-01-18 NOTE — Progress Notes (Signed)
Subjective:    Patient ID: Ariel Medina, female    DOB: [birth year], 83 y.o.   MRN: [id number]  DOS:  01/18/2017 Type of visit - description : Follow-up Interval history: Since the last visit, had an accidental fall and a fracture. Was admitted to the hospital and eventually discharged home. Since she left the hospital, she is feeling well and she says she is very happy. She noted weight loss, states that she feels well, she is just not eating as much as before. See review of systems. Pain from the fracture is currently control with Tylenol only. Not taking narcotics. No further falls since April. Did report right leg swelling, on and off for about 3 weeks, no calf pain, swelling decrease with rest, minimal swelling  early in the mornings.  Wt Readings from Last 3 Encounters:  01/18/17 135 lb 8 oz (61.5 kg)  12/16/16 147 lb 9.6 oz (67 kg)  12/01/16 139 lb 15.9 oz (63.5 kg)    Review of Systems Denies fever chills No chest pain or difficulty breathing. No diarrhea. No cough. No anxiety or depression. No headaches.  Past Medical History:  Diagnosis Date  . Breast CA ([place])    left breast-invasive ductal ca stage 1-stopped arimidex 2008  . Diverticulitis of colon   . Hiatal hernia   . Hyperlipidemia   . Hypertension   . Hypothyroidism   . Osteoarthritis   . Osteopenia    DEXA 7/02  . Syncope 2005   CT head (-), ECHO essent. neg, stress test (-), carotid u/s (-)    Past Surgical History:  Procedure Laterality Date  . BREAST LUMPECTOMY  04/2002   left  . TONSILLECTOMY AND ADENOIDECTOMY      Social History   Social History  . Marital status: Widowed    Spouse name: N/A  . Number of children: 2  . Years of education: N/A   Occupational History  . n/a Retired   Social History Main Topics  . Smoking status: Never Smoker  . Smokeless tobacco: Never Used  . Alcohol use No  . Drug use: No  . Sexual activity: Not on file   Other Topics Concern  . Not on file    Social History Narrative   Lost a son 73   Her other son lives w/ her on-off [name], h/o etoh, he is  one of my patients)   Sister live next door , does help the pt    G-son @ [place] , G-daughter @ [employer] since 1993   still drives          Allergies as of 01/18/2017      Reactions   Ibuprofen    REACTION: bleed      Medication List       Accurate as of 01/18/17  5:13 PM. Always use your most recent med list.          acetaminophen 500 MG tablet Commonly known as:  TYLENOL Take 500 mg by mouth every 6 (six) hours as needed for mild pain, moderate pain or headache.   aspirin 81 MG tablet Take 81 mg by mouth every morning.   CALTRATE 600+D 600-400 MG-UNIT tablet Generic drug:  Calcium Carbonate-Vitamin D Take 1 tablet by mouth daily.   fluticasone 50 MCG/ACT nasal spray Commonly known as:  FLONASE Place 2 sprays into both nostrils daily as needed for allergies or rhinitis.   hydrochlorothiazide 25 MG tablet Commonly known as:  HYDRODIURIL Take 1 tablet (25 mg total) by mouth daily.   ICAPS AREDS 2 PO Take 1 capsule by mouth 2 (two) times daily.   levothyroxine 75 MCG tablet Commonly known as:  SYNTHROID, LEVOTHROID Take 1 tablet (75 mcg total) by mouth daily before breakfast.   polyethylene glycol packet Commonly known as:  MIRALAX / GLYCOLAX Take 17 g by mouth daily as needed for moderate constipation.   potassium chloride 10 MEQ tablet Commonly known as:  K-DUR Take 1 tablet (10 mEq total) by mouth daily.   rosuvastatin 5 MG tablet Commonly known as:  CRESTOR Take 1 tablet (5 mg total) by mouth daily.          Objective:   Physical Exam BP 124/66 (BP Location: Left Arm, Patient Position: Sitting, Cuff Size: Small)   Pulse 66   Temp 98.1 F (36.7 C) (Oral)   Resp 14   Ht 5\' 3"  (1.6 m)   Wt 135 lb 8 oz (61.5 kg)   SpO2 96%   BMI 24.00 kg/m  General:   Well developed, well nourished . NAD.  HEENT:  Normocephalic .  Face symmetric, atraumatic Lungs:  CTA B Normal respiratory effort, no intercostal retractions, no accessory muscle use. Heart: RRR,  no murmur.  Lower extremities: Left leg normal. Right leg: Calf slt larger than the L, not tender. Distal from the calf, the right leg is definitely  larger by approximately 1.5 inches. Skin: Not pale. Not jaundice Neurologic:  alert & oriented X3.  Speech normal, gait assisted by a cane, shuffles, very small steps, at baseline. Psych--  Cognition and judgment appear intact.  Cooperative with normal attention span and concentration.  Behavior appropriate. No anxious or depressed appearing.      Assessment & Plan:   Assessment  HTN Hyperlipidemia Hypothyroidism Osteopenia:  --T score -2.1 (04-2012) DC Fosamax 12/2013 after 5 years --T score -1.5   (02-2015)  DJD GERD  Tremors, likely essential H/o diverticulitis Breast cancer, L breast, release from oncology 2013 Syncope 2005 11-2016: Fall, FX L pubic ramus, admitted then d/c to a NH, back home 12-18-16   PLAN: HTN: Seems well-controlled on HCTZ, potassium. Check a BMP Hypothyroidism: On Synthroid, check a TSH Fall and left pubic ramus fracture. Recovering well, currently on Tylenol for pain control. Status post PT, uses her cane consistently. Weight loss: Weight loss in the last few weeks, unclear etiology, she feels well, ROS negative. We are checking labs today including a TSH. Rec observation. Right leg edema: leg is not tender, swelling going on for 3 weeks. We'll get ultrasound to rule out DVT. RTC 3 months mostly to follow-up on her weight.

## 2017-01-18 NOTE — Assessment & Plan Note (Signed)
HTN: Seems well-controlled on HCTZ, potassium. Check a BMP Hypothyroidism: On Synthroid, check a TSH Fall and left pubic ramus fracture. Recovering well, currently on Tylenol for pain control. Status post PT, uses her cane consistently. Weight loss: Weight loss in the last few weeks, unclear etiology, she feels well, ROS negative. We are checking labs today including a TSH. Rec observation. Right leg edema: leg is not tender, swelling going on for 3 weeks. We'll get ultrasound to rule out DVT. RTC 3 months mostly to follow-up on her we

## 2017-01-18 NOTE — Patient Instructions (Signed)
GO TO THE LAB : Get the blood work     GO TO THE FRONT DESK Schedule your next appointment for a  checkup in 3 months   STOP BY THE FIRST FLOOR:  We need to do an ultrasound of the leg to be sure there is no clot.   Elevated legs for at least an hour twice a day. If the right leg swelling is worsening or not going away let us know.

## 2017-01-26 ENCOUNTER — Other Ambulatory Visit: Payer: Self-pay | Admitting: Internal Medicine

## 2017-02-28 DIAGNOSIS — M25561 Pain in right knee: Secondary | ICD-10-CM | POA: Diagnosis not present

## 2017-02-28 DIAGNOSIS — M1711 Unilateral primary osteoarthritis, right knee: Secondary | ICD-10-CM | POA: Diagnosis not present

## 2017-03-01 ENCOUNTER — Other Ambulatory Visit: Payer: Self-pay | Admitting: Internal Medicine

## 2017-03-14 ENCOUNTER — Other Ambulatory Visit: Payer: Self-pay | Admitting: Internal Medicine

## 2017-03-16 ENCOUNTER — Other Ambulatory Visit: Payer: Self-pay | Admitting: Internal Medicine

## 2017-04-15 ENCOUNTER — Ambulatory Visit (INDEPENDENT_AMBULATORY_CARE_PROVIDER_SITE_OTHER): Payer: Medicare HMO | Admitting: Podiatry

## 2017-04-15 ENCOUNTER — Encounter: Payer: Self-pay | Admitting: Podiatry

## 2017-04-15 DIAGNOSIS — Q828 Other specified congenital malformations of skin: Secondary | ICD-10-CM

## 2017-04-15 DIAGNOSIS — M79676 Pain in unspecified toe(s): Secondary | ICD-10-CM

## 2017-04-15 DIAGNOSIS — B351 Tinea unguium: Secondary | ICD-10-CM

## 2017-04-15 NOTE — Progress Notes (Signed)
Patient ID: Ariel Medina, female   DOB: May 11, 1928, 83 y.o.   MRN: [id number] Complaint:  Visit Type: Patient returns to my office for continued preventative foot care services. Complaint: Patient states" my nails have grown long and thick and become painful to walk and wear shoes" . She presents for preventative foot care services. No changes to ROS.  She has painful calluses on the bottom of both feet.  Podiatric Exam: Vascular: dorsalis pedis and posterior tibial pulses are palpable bilateral. Capillary return is immediate. Temperature gradient is WNL. Skin turgor WNL  Sensorium: Normal Semmes Weinstein monofilament test. Normal tactile sensation bilaterally. Nail Exam: Pt has thick disfigured discolored nails with subungual debris noted left foot entire nail hallux through fifth toenails and hallux right foot. Ulcer Exam: There is no evidence of ulcer or pre-ulcerative changes or infection. Orthopedic Exam: Muscle tone and strength are WNL. No limitations in general ROM. No crepitus or effusions noted. Foot type and digits show no abnormalities. Bony prominences are unremarkable. Skin:  Porokeratosis sub fifth mets B/L.Marland Kitchen No infection or ulcers  Diagnosis:  Tinea unguium, Pain in right toe, pain in left toes, Porokeratosis 5th B/L  Treatment & Plan Procedures and Treatment: Consent by patient was obtained for treatment procedures. The patient understood the discussion of treatment and procedures well. All questions were answered thoroughly reviewed. Debridement of mycotic and hypertrophic toenails, 1 through 5 bilateral and clearing of subungual debris. No ulceration, no infection noted. Debridement of porokeratosis Return Visit-Office Procedure: Patient instructed to return to the office for a follow up visit 3 months for continued evaluation and treatment.   Gardiner Barefoot DPM

## 2017-04-19 ENCOUNTER — Ambulatory Visit: Payer: Medicare HMO | Admitting: Podiatry

## 2017-04-20 ENCOUNTER — Ambulatory Visit (INDEPENDENT_AMBULATORY_CARE_PROVIDER_SITE_OTHER): Payer: Medicare HMO | Admitting: Internal Medicine

## 2017-04-20 ENCOUNTER — Encounter: Payer: Self-pay | Admitting: Internal Medicine

## 2017-04-20 ENCOUNTER — Ambulatory Visit (HOSPITAL_BASED_OUTPATIENT_CLINIC_OR_DEPARTMENT_OTHER)
Admission: RE | Admit: 2017-04-20 | Discharge: 2017-04-20 | Disposition: A | Discharge status: Home / Self Care | Payer: Medicare HMO | Source: Ambulatory Visit | Attending: Internal Medicine | Admitting: Internal Medicine

## 2017-04-20 VITALS — BP 118/68 | HR 79 | Temp 98.2°F | Resp 14 | Ht 63.0 in | Wt 133.4 lb

## 2017-04-20 DIAGNOSIS — R918 Other nonspecific abnormal finding of lung field: Secondary | ICD-10-CM | POA: Insufficient documentation

## 2017-04-20 DIAGNOSIS — R634 Abnormal weight loss: Secondary | ICD-10-CM | POA: Diagnosis not present

## 2017-04-20 DIAGNOSIS — R0602 Shortness of breath: Secondary | ICD-10-CM | POA: Diagnosis not present

## 2017-04-20 DIAGNOSIS — Z853 Personal history of malignant neoplasm of breast: Secondary | ICD-10-CM

## 2017-04-20 DIAGNOSIS — K449 Diaphragmatic hernia without obstruction or gangrene: Secondary | ICD-10-CM | POA: Insufficient documentation

## 2017-04-20 DIAGNOSIS — I700 Atherosclerosis of aorta: Secondary | ICD-10-CM | POA: Insufficient documentation

## 2017-04-20 DIAGNOSIS — Z23 Encounter for immunization: Secondary | ICD-10-CM | POA: Diagnosis not present

## 2017-04-20 DIAGNOSIS — J189 Pneumonia, unspecified organism: Secondary | ICD-10-CM

## 2017-04-20 DIAGNOSIS — Z1231 Encounter for screening mammogram for malignant neoplasm of breast: Secondary | ICD-10-CM

## 2017-04-20 NOTE — Progress Notes (Signed)
Pre visit review using our clinic review tool, if applicable. No additional management support is needed unless otherwise documented below in the visit note. 

## 2017-04-20 NOTE — Patient Instructions (Signed)
GO TO THE FRONT DESK Schedule your next appointment for a  checkup in 3 months   STOP BY THE FIRST FLOOR:  get the XR

## 2017-04-20 NOTE — Progress Notes (Signed)
Subjective:    Patient ID: Ariel Medina, female    DOB: Oct 19, 1927, 83 y.o.   MRN: [id number]  DOS:  04/20/2017 Type of visit - description : f/u Interval history: I asked her to come back for a follow-up due to weight loss. She feels well, has a good appetite and eats regular meals. Has noted a modest amount of weight loss. Otherwise she feels well, she is excited about her new g- grandbaby.  Wt Readings from Last 3 Encounters:  04/20/17 133 lb 6 oz (60.5 kg)  01/18/17 135 lb 8 oz (61.5 kg)  12/16/16 147 lb 9.6 oz (67 kg)     Review of Systems No fever chills No nocturnal chills Sleeps well No postprandial abdominal pain No anxiety or depression   Past Medical History:  Diagnosis Date  . Breast CA ([place])    left breast-invasive ductal ca stage 1-stopped arimidex 2008  . Diverticulitis of colon   . Hiatal hernia   . Hyperlipidemia   . Hypertension   . Hypothyroidism   . Osteoarthritis   . Osteopenia    DEXA 7/02  . Syncope 2005   CT head (-), ECHO essent. neg, stress test (-), carotid u/s (-)    Past Surgical History:  Procedure Laterality Date  . BREAST LUMPECTOMY  04/2002   left  . TONSILLECTOMY AND ADENOIDECTOMY      Social History   Social History  . Marital status: Widowed    Spouse name: N/A  . Number of children: 2  . Years of education: N/A   Occupational History  . n/a Retired   Social History Main Topics  . Smoking status: Never Smoker  . Smokeless tobacco: Never Used  . Alcohol use No  . Drug use: No  . Sexual activity: Not on file   Other Topics Concern  . Not on file   Social History Narrative   Lost a son 70   Her other son lives w/ her on-off [name], h/o etoh, he is  one of my patients)   Sister live next door , does help the pt    G-son @ [place] , G-daughter @ [employer] since 1993   still drives          Allergies as of 04/20/2017      Reactions   Ibuprofen    REACTION: bleed      Medication  List       Accurate as of 04/20/17 11:59 PM. Always use your most recent med list.          acetaminophen 500 MG tablet Commonly known as:  TYLENOL Take 500 mg by mouth every 6 (six) hours as needed for mild pain, moderate pain or headache.   aspirin 81 MG tablet Take 81 mg by mouth every morning.   CALTRATE 600+D 600-400 MG-UNIT tablet Generic drug:  Calcium Carbonate-Vitamin D Take 1 tablet by mouth daily.   fluticasone 50 MCG/ACT nasal spray Commonly known as:  FLONASE Place 2 sprays into both nostrils daily as needed for allergies or rhinitis.   hydrochlorothiazide 25 MG tablet Commonly known as:  HYDRODIURIL Take 1 tablet (25 mg total) by mouth daily.   ICAPS AREDS 2 PO Take 1 capsule by mouth 2 (two) times daily.   levothyroxine 75 MCG tablet Commonly known as:  SYNTHROID, LEVOTHROID Take 1 tablet (75 mcg total) by mouth daily before breakfast.   polyethylene glycol packet Commonly known as:  MIRALAX / GLYCOLAX  Take 17 g by mouth daily as needed for moderate constipation.   potassium chloride 10 MEQ tablet Commonly known as:  K-DUR,KLOR-CON Take 1 tablet (10 mEq total) by mouth daily.   rosuvastatin 5 MG tablet Commonly known as:  CRESTOR Take 1 tablet (5 mg total) by mouth daily.            Discharge Care Instructions        Start     Ordered   04/20/17 0000  DG Chest 2 View    Question Answer Comment  Reason for Exam (SYMPTOM  OR DIAGNOSIS REQUIRED) wt loss   Preferred imaging location? [facility]      04/20/17 1115   04/20/17 0000  Flu vaccine HIGH DOSE PF (Fluzone High dose)     04/20/17 1115   04/20/17 0000  MM Digital Screening    Question Answer Comment  Reason for Exam (SYMPTOM  OR DIAGNOSIS REQUIRED) screening mammogram   Preferred imaging location? [facility]      04/20/17 1115         Objective:   Physical Exam BP 118/68 (BP Location: Right Arm, Patient Position: Sitting, Cuff Size: Small)   Pulse 79    Temp 98.2 F (36.8 C) (Oral)   Resp 14   Ht 5\' 3"  (1.6 m)   Wt 133 lb 6 oz (60.5 kg)   SpO2 97%   BMI 23.63 kg/m  General:   Well developed, well nourished . NAD.  HEENT:  Normocephalic . Face symmetric, atraumatic Neck: No mass or LAD Lungs:  CTA B Normal respiratory effort, no intercostal retractions, no accessory muscle use. Heart: RRR,  no murmur.  Skin: Not pale. Not jaundice Neurologic:  alert & oriented X3.  Speech normal, gait: Uses a cane, small steps. Tremors noted  Psych--  Cognition and judgment appear intact.  Cooperative with normal attention span and concentration.  Behavior appropriate. No anxious or depressed appearing.      Assessment & Plan:   Assessment  HTN Hyperlipidemia Hypothyroidism Osteopenia:  --T score -2.1 (04-2012) DC Fosamax 12/2013 after 5 years --T score -1.5   (02-2015)  DJD GERD  Tremors, likely essential H/o diverticulitis Breast cancer, L breast, release from oncology 2013 Syncope 2005 11-2016: Fall, FX L pubic ramus, admitted then d/c to a NH, back home 12-18-16  PLAN: Weight loss: Here for recheck of her weight loss, has lost 2 pounds in the last 3 months however she feels well, ROS (-), recent labs showed that her thyroid is well balanced and there is no anemia, leukocytosis or thrombocytopenia. For completeness we'll get a chest x-ray otherwise  observation Flu shot today  RTC 3 months.Marland Kitchen

## 2017-04-21 NOTE — Assessment & Plan Note (Signed)
Weight loss: Here for recheck of her weight loss, has lost 2 pounds in the last 3 months however she feels well, ROS (-), recent labs showed that her thyroid is well balanced and there is no anemia, leukocytosis or thrombocytopenia. For completeness we'll get a chest x-ray otherwise  observation Flu shot today  RTC 3 months.Marland Kitchen

## 2017-04-22 MED ORDER — DOXYCYCLINE HYCLATE 100 MG PO TABS: 100.0000 mg | ORAL_TABLET | Freq: Two times a day (BID) | ORAL | 0 refills | Status: DC

## 2017-04-22 NOTE — Addendum Note (Signed)
Addended byDamita Dunnings D on: 04/22/2017 10:19 AM   Modules accepted: Orders

## 2017-05-16 ENCOUNTER — Other Ambulatory Visit: Payer: Self-pay | Admitting: Internal Medicine

## 2017-05-23 ENCOUNTER — Telehealth: Payer: Self-pay | Admitting: Internal Medicine

## 2017-05-23 NOTE — Telephone Encounter (Signed)
Patient is due for a chest x-ray, please arrange. Also, ask how she is feeling, has she lost more weight?  Let me know

## 2017-05-24 ENCOUNTER — Ambulatory Visit (HOSPITAL_BASED_OUTPATIENT_CLINIC_OR_DEPARTMENT_OTHER)
Admission: RE | Admit: 2017-05-24 | Discharge: 2017-05-24 | Disposition: A | Discharge status: Home / Self Care | Payer: Medicare HMO | Source: Ambulatory Visit | Attending: Internal Medicine | Admitting: Internal Medicine

## 2017-05-24 DIAGNOSIS — J189 Pneumonia, unspecified organism: Secondary | ICD-10-CM

## 2017-05-24 DIAGNOSIS — J449 Chronic obstructive pulmonary disease, unspecified: Secondary | ICD-10-CM | POA: Insufficient documentation

## 2017-05-24 DIAGNOSIS — J9811 Atelectasis: Secondary | ICD-10-CM | POA: Insufficient documentation

## 2017-05-24 DIAGNOSIS — I517 Cardiomegaly: Secondary | ICD-10-CM | POA: Diagnosis not present

## 2017-05-24 DIAGNOSIS — K449 Diaphragmatic hernia without obstruction or gangrene: Secondary | ICD-10-CM | POA: Insufficient documentation

## 2017-05-24 DIAGNOSIS — J984 Other disorders of lung: Secondary | ICD-10-CM | POA: Insufficient documentation

## 2017-05-24 DIAGNOSIS — R634 Abnormal weight loss: Secondary | ICD-10-CM

## 2017-05-25 NOTE — Telephone Encounter (Signed)
Chest x-ray completed 05/24/2017. See x-ray results.

## 2017-05-30 ENCOUNTER — Encounter (HOSPITAL_BASED_OUTPATIENT_CLINIC_OR_DEPARTMENT_OTHER): Payer: Self-pay

## 2017-05-30 ENCOUNTER — Ambulatory Visit (HOSPITAL_BASED_OUTPATIENT_CLINIC_OR_DEPARTMENT_OTHER)
Admission: RE | Admit: 2017-05-30 | Discharge: 2017-05-30 | Disposition: A | Discharge status: Home / Self Care | Payer: Medicare HMO | Source: Ambulatory Visit | Attending: Internal Medicine | Admitting: Internal Medicine

## 2017-05-30 DIAGNOSIS — Z1231 Encounter for screening mammogram for malignant neoplasm of breast: Secondary | ICD-10-CM | POA: Insufficient documentation

## 2017-05-30 DIAGNOSIS — Z853 Personal history of malignant neoplasm of breast: Secondary | ICD-10-CM

## 2017-06-10 ENCOUNTER — Telehealth: Payer: Self-pay | Admitting: Internal Medicine

## 2017-06-10 MED ORDER — HYDROCHLOROTHIAZIDE 25 MG PO TABS: 25.0000 mg | ORAL_TABLET | Freq: Every day | ORAL | 1 refills | Status: DC

## 2017-06-10 NOTE — Telephone Encounter (Signed)
HCTZ refilled to [facility]. Pt will need to call [employer] mail order to cancel further refills for Flonase.

## 2017-06-10 NOTE — Telephone Encounter (Signed)
Pt called requesting refill sent to [facility] for HYDROCHLOROTHIAZIDE 25 MG. Pt also wants a note in her chart that she does not need any more nasal spray.

## 2017-07-06 ENCOUNTER — Other Ambulatory Visit: Payer: Self-pay | Admitting: Internal Medicine

## 2017-07-18 ENCOUNTER — Other Ambulatory Visit: Payer: Self-pay | Admitting: Internal Medicine

## 2017-07-20 ENCOUNTER — Ambulatory Visit: Payer: Medicare HMO | Admitting: Internal Medicine

## 2017-07-22 ENCOUNTER — Ambulatory Visit: Payer: Medicare HMO | Admitting: Podiatry

## 2017-07-25 ENCOUNTER — Encounter: Payer: Self-pay | Admitting: Internal Medicine

## 2017-07-25 ENCOUNTER — Ambulatory Visit: Payer: Medicare HMO | Admitting: Internal Medicine

## 2017-07-25 VITALS — BP 132/74 | HR 73 | Temp 98.1°F | Resp 14 | Ht 63.0 in | Wt 130.2 lb

## 2017-07-25 DIAGNOSIS — I10 Essential (primary) hypertension: Secondary | ICD-10-CM | POA: Diagnosis not present

## 2017-07-25 DIAGNOSIS — M810 Age-related osteoporosis without current pathological fracture: Secondary | ICD-10-CM

## 2017-07-25 DIAGNOSIS — E785 Hyperlipidemia, unspecified: Secondary | ICD-10-CM

## 2017-07-25 NOTE — Progress Notes (Signed)
Pre visit review using our clinic review tool, if applicable. No additional management support is needed unless otherwise documented below in the visit note. 

## 2017-07-25 NOTE — Patient Instructions (Signed)
GO TO THE LAB : Get the blood work     GO TO THE FRONT DESK Schedule your next appointment for a checkup in 4 months  We will schedule a bone density test

## 2017-07-25 NOTE — Progress Notes (Signed)
Subjective:    Patient ID: Ariel Medina, female    DOB: Aug 28, 1927, 83 y.o.   MRN: [id number]  DOS:  07/25/2017 Type of visit - description : f/u  Interval history: HTN: Good compliance with medication, ambulatory BP very good DJD: Ongoing but acceptable pain at the knees with some stiffness.    Hypothyroidism and high chol: good med compliance. Weight loss: Mild weight loss noted again.   Wt Readings from Last 3 Encounters:  07/25/17 130 lb 4 oz (59.1 kg)  04/20/17 133 lb 6 oz (60.5 kg)  01/18/17 135 lb 8 oz (61.5 kg)     Review of Systems   No fever chills.  No vomiting or blood in the stools. Occasional difficulty swallowing but all okay if she chews her food thoroughly.  No odynophagia. Edema at baseline. No visual disturbances or headache.  Past Medical History:  Diagnosis Date  . Breast CA ([place])    left breast-invasive ductal ca stage 1-stopped arimidex 2008  . Diverticulitis of colon   . Hiatal hernia   . Hyperlipidemia   . Hypertension   . Hypothyroidism   . Osteoarthritis   . Osteopenia    DEXA 7/02  . Syncope 2005   CT head (-), ECHO essent. neg, stress test (-), carotid u/s (-)    Past Surgical History:  Procedure Laterality Date  . BREAST BIOPSY Bilateral   . BREAST LUMPECTOMY  04/2002   left  . TONSILLECTOMY AND ADENOIDECTOMY      Social History   Socioeconomic History  . Marital status: Widowed    Spouse name: Not on file  . Number of children: 2  . Years of education: Not on file  . Highest education level: Not on file  Social Needs  . Financial resource strain: Not on file  . Food insecurity - worry: Not on file  . Food insecurity - inability: Not on file  . Transportation needs - medical: Not on file  . Transportation needs - non-medical: Not on file  Occupational History  . Occupation: n/a    Employer: RETIRED  Tobacco Use  . Smoking status: Never Smoker  . Smokeless tobacco: Never Used  Substance and Sexual Activity  .  Alcohol use: No  . Drug use: No  . Sexual activity: Not on file  Other Topics Concern  . Not on file  Social History Narrative   Lost a son 38   Her other son lives w/ her on-off [name], h/o etoh, he is  one of my patients)   Sister live next door , does help the pt    G-son @ [place] , G-daughter @ [employer] since 1993   still drives       Allergies as of 07/25/2017      Reactions   Ibuprofen    REACTION: bleed      Medication List        Accurate as of 07/25/17 11:59 PM. Always use your most recent med list.          acetaminophen 500 MG tablet Commonly known as:  TYLENOL Take 500 mg by mouth every 6 (six) hours as needed for mild pain, moderate pain or headache.   aspirin 81 MG tablet Take 81 mg by mouth every morning.   CALTRATE 600+D 600-400 MG-UNIT tablet Generic drug:  Calcium Carbonate-Vitamin D Take 1 tablet by mouth daily.   fluticasone 50 MCG/ACT nasal spray Commonly known as:  FLONASE Place 2  sprays into both nostrils daily as needed for allergies or rhinitis.   hydrochlorothiazide 25 MG tablet Commonly known as:  HYDRODIURIL Take 1 tablet (25 mg total) by mouth daily.   ICAPS AREDS 2 PO Take 1 capsule by mouth 2 (two) times daily.   levothyroxine 75 MCG tablet Commonly known as:  SYNTHROID, LEVOTHROID Take 1 tablet (75 mcg total) by mouth daily before breakfast.   polyethylene glycol packet Commonly known as:  MIRALAX / GLYCOLAX Take 17 g by mouth daily as needed for moderate constipation.   potassium chloride 10 MEQ tablet Commonly known as:  K-DUR,KLOR-CON Take 1 tablet (10 mEq total) by mouth daily.   rosuvastatin 5 MG tablet Commonly known as:  CRESTOR Take 1 tablet (5 mg total) by mouth daily.          Objective:   Physical Exam BP 132/74 (BP Location: Right Arm, Patient Position: Sitting, Cuff Size: Small)   Pulse 73   Temp 98.1 F (36.7 C) (Oral)   Resp 14   Ht 5\' 3"  (1.6 m)   Wt 130 lb 4 oz (59.1  kg)   SpO2 93%   BMI 23.07 kg/m  General:   Well developed, well nourished . NAD.  HEENT:  Normocephalic . Face symmetric, atraumatic Lymphatic system: No LADs of the neck, armpits or groin Lungs:  CTA B Normal respiratory effort, no intercostal retractions, no accessory muscle use. Heart: RRR,  no murmur.  no pretibial edema bilaterally  Abdomen:  Not distended, soft, non-tender. No rebound or rigidity.   Skin: Not pale. Not jaundice Neurologic:  alert & oriented X3.  Speech normal, gait appropriate for age and history of DJD, small -short steps, uses a 3 pronged cane. Psych--  Cognition and judgment appear intact.  Cooperative with normal attention span and concentration.  Behavior appropriate. No anxious or depressed appearing.     Assessment & Plan:   Assessment  HTN Hyperlipidemia Hypothyroidism Osteopenia:  --T score -2.1 (04-2012) DC Fosamax 12/2013 after 5 years --T score -1.5   (02-2015)  DJD GERD  Tremors, likely essential H/o diverticulitis Breast cancer, L breast, release from oncology 2013 Syncope 2005 11-2016: Fall, FX L pubic ramus, admitted then d/c to a NH, back home 12-18-16 Edema, R>L U/S (-)  for DVT 01/2017  PLAN: HTN: Seems well controlled on HCTZ and potassium.  Check CMP Hyperlipidemia: On Crestor, due for a FLP, not fasting.  Check a lipid panel Hypothyroidism: On Synthroid, last TSH satisfactory Weight loss, continue with mild weight loss, see last visit, chest x-ray showed question of pneumonia, had doxycycline, follow-up x-ray negative. Physical exam benign.  No LADs.  Recommend observation for now. Osteopenia: had a  fragility fracture (pubic ramus fracture).  Recheck a dexa, is likely will benefit from Prolia (have some difficulty swallowing) RTC 4 months

## 2017-07-26 LAB — LIPID PANEL
CHOLESTEROL: 178 mg/dL (ref 0–200)
HDL: 65.6 mg/dL (ref >39.00)
LDL Cholesterol: 95 mg/dL (ref 0–99)
NonHDL: 112.78
Total CHOL/HDL Ratio: 3
Triglycerides: 89 mg/dL (ref 0.0–149.0)
VLDL: 17.8 mg/dL (ref 0.0–40.0)

## 2017-07-26 LAB — COMPREHENSIVE METABOLIC PANEL
ALBUMIN: 3.9 g/dL (ref 3.5–5.2)
ALK PHOS: 40 U/L (ref 39–117)
ALT: 8 U/L (ref 0–35)
AST: 18 U/L (ref 0–37)
BILIRUBIN TOTAL: 0.6 mg/dL (ref 0.2–1.2)
BUN: 15 mg/dL (ref 6–23)
CO2: 36 mEq/L — ABNORMAL HIGH (ref 19–32)
Calcium: 9.5 mg/dL (ref 8.4–10.5)
Chloride: 97 mEq/L (ref 96–112)
Creatinine, Ser: 0.73 mg/dL (ref 0.40–1.20)
GFR: 80.1 mL/min (ref >60.00)
Glucose, Bld: 84 mg/dL (ref 70–99)
POTASSIUM: 3.9 meq/L (ref 3.5–5.1)
SODIUM: 136 meq/L (ref 135–145)
TOTAL PROTEIN: 6.5 g/dL (ref 6.0–8.3)

## 2017-07-26 NOTE — Assessment & Plan Note (Signed)
HTN: Seems well controlled on HCTZ and potassium.  Check CMP Hyperlipidemia: On Crestor, due for a FLP, not fasting.  Check a lipid panel Hypothyroidism: On Synthroid, last TSH satisfactory Weight loss, continue with mild weight loss, see last visit, chest x-ray showed question of pneumonia, had doxycycline, follow-up x-ray negative. Physical exam benign.  No LADs.  Recommend observation for now. Osteopenia: had a  fragility fracture (pubic ramus fracture).  Recheck a dexa, is likely will benefit from Prolia (have some difficulty swallowing) RTC 4 months

## 2017-08-01 ENCOUNTER — Other Ambulatory Visit: Payer: Self-pay | Admitting: Internal Medicine

## 2017-08-12 ENCOUNTER — Encounter: Payer: Self-pay | Admitting: Internal Medicine

## 2017-08-12 ENCOUNTER — Ambulatory Visit (INDEPENDENT_AMBULATORY_CARE_PROVIDER_SITE_OTHER): Payer: Medicare HMO | Admitting: Internal Medicine

## 2017-08-12 VITALS — BP 128/82 | HR 92 | Temp 98.1°F | Resp 14 | Ht 63.0 in | Wt 127.1 lb

## 2017-08-12 DIAGNOSIS — M549 Dorsalgia, unspecified: Secondary | ICD-10-CM | POA: Diagnosis not present

## 2017-08-12 DIAGNOSIS — H9202 Otalgia, left ear: Secondary | ICD-10-CM | POA: Diagnosis not present

## 2017-08-12 MED ORDER — CYCLOBENZAPRINE HCL 5 MG PO TABS: 5.0000 mg | ORAL_TABLET | Freq: Two times a day (BID) | ORAL | 0 refills | Status: DC | PRN

## 2017-08-12 NOTE — Progress Notes (Signed)
Pre visit review using our clinic review tool, if applicable. No additional management support is needed unless otherwise documented below in the visit note. 

## 2017-08-12 NOTE — Patient Instructions (Signed)
Okay to take Tylenol as needed for pain  Stop ibuprofen  Okay to take a muscle relaxant (cyclobenzaprine - Flexeril) twice a day as needed only, watch for excessive somnolence  Call if pain does not continue improving or if it comes back.  Call if fever, chills, severe pain or rash

## 2017-08-12 NOTE — Progress Notes (Signed)
Subjective:    Patient ID: Ariel Medina, female    DOB: 1928-07-03, 84 y.o.   MRN: [id number]  DOS:  08/12/2017 Type of visit - description : Acute visit, back pain Interval history: A week ago noted pain in the low back pain bilaterally on also at the right mid back. Did not radiate to the leg, was on and off, sharp, pt not sure if related to moving her torso. Taking ibuprofen without apparent side effects. Today for the first time she feels better. She reports running out of Flexeril few days ago, thinks is possibly the culprit of her symptoms    Review of Systems No fever chills Appetite is normal, pain not related with food. Denies any fall, injury but thinks she "overdid it" during Christmas (increased physical activity) No rash No dysuria, gross hematuria difficulty urinating No cough   Past Medical History:  Diagnosis Date  . Breast CA ([place])    left breast-invasive ductal ca stage 1-stopped arimidex 2008  . Diverticulitis of colon   . Hiatal hernia   . Hyperlipidemia   . Hypertension   . Hypothyroidism   . Osteoarthritis   . Osteopenia    DEXA 7/02  . Syncope 2005   CT head (-), ECHO essent. neg, stress test (-), carotid u/s (-)    Past Surgical History:  Procedure Laterality Date  . BREAST BIOPSY Bilateral   . BREAST LUMPECTOMY  04/2002   left  . TONSILLECTOMY AND ADENOIDECTOMY      Social History   Socioeconomic History  . Marital status: Widowed    Spouse name: Not on file  . Number of children: 2  . Years of education: Not on file  . Highest education level: Not on file  Social Needs  . Financial resource strain: Not on file  . Food insecurity - worry: Not on file  . Food insecurity - inability: Not on file  . Transportation needs - medical: Not on file  . Transportation needs - non-medical: Not on file  Occupational History  . Occupation: n/a    Employer: RETIRED  Tobacco Use  . Smoking status: Never Smoker  . Smokeless tobacco: Never  Used  Substance and Sexual Activity  . Alcohol use: No  . Drug use: No  . Sexual activity: Not on file  Other Topics Concern  . Not on file  Social History Narrative   Lost a son 7   Her other son lives w/ her on-off [name], h/o etoh, he is  one of my patients)   Sister live next door , does help the pt    G-son @ [employer] , G-daughter @ [employer] since 1993   still drives       Allergies as of 08/12/2017      Reactions   Ibuprofen    REACTION: bleed      Medication List        Accurate as of 08/12/17 11:59 PM. Always use your most recent med list.          acetaminophen 500 MG tablet Commonly known as:  TYLENOL Take 500 mg by mouth every 6 (six) hours as needed for mild pain, moderate pain or headache.   aspirin 81 MG tablet Take 81 mg by mouth every morning.   CALTRATE 600+D 600-400 MG-UNIT tablet Generic drug:  Calcium Carbonate-Vitamin D Take 1 tablet by mouth daily.   cyclobenzaprine 5 MG tablet Commonly known as:  FLEXERIL Take 1  tablet (5 mg total) by mouth 2 (two) times daily as needed for muscle spasms.   fluticasone 50 MCG/ACT nasal spray Commonly known as:  FLONASE Place 2 sprays into both nostrils daily as needed for allergies or rhinitis.   hydrochlorothiazide 25 MG tablet Commonly known as:  HYDRODIURIL Take 1 tablet (25 mg total) by mouth daily.   ICAPS AREDS 2 PO Take 1 capsule by mouth 2 (two) times daily.   levothyroxine 75 MCG tablet Commonly known as:  SYNTHROID, LEVOTHROID Take 1 tablet (75 mcg total) by mouth daily before breakfast.   polyethylene glycol packet Commonly known as:  MIRALAX / GLYCOLAX Take 17 g by mouth daily as needed for moderate constipation.   potassium chloride 10 MEQ tablet Commonly known as:  K-DUR,KLOR-CON Take 1 tablet (10 mEq total) by mouth daily.   rosuvastatin 5 MG tablet Commonly known as:  CRESTOR Take 1 tablet (5 mg total) by mouth daily.          Objective:   Physical  Exam BP 128/82 (BP Location: Right Arm, Patient Position: Sitting, Cuff Size: Small)   Pulse 92   Temp 98.1 F (36.7 C) (Oral)   Resp 14   Ht 5\' 3"  (1.6 m)   Wt 127 lb 2 oz (57.7 kg)   SpO2 97%   BMI 22.52 kg/m  General:   Well developed, well appearing elderly lady in no distress.  HEENT:  Normocephalic . Face symmetric, atraumatic MSK: No TTP at the lumbar or sacroiliac areas Abdomen:  Not distended, soft, non-tender. No rebound or rigidity.   Skin: No rash on the abdomen, flanks or back Neurologic:  alert & oriented X3.  Speech normal, gait appropriate for age and unassisted.  Straight leg test negative Psych--  Cognition and judgment appear intact.  Cooperative with normal attention span and concentration.  Behavior appropriate. No anxious or depressed appearing.     Assessment & Plan:   Assessment  HTN Hyperlipidemia Hypothyroidism Osteopenia:  --T score -2.1 (04-2012) DC Fosamax 12/2013 after 5 years --T score -1.5   (02-2015)  DJD GERD  Tremors, likely essential H/o diverticulitis Breast cancer, L breast, release from oncology 2013 Syncope 2005 11-2016: Fall, FX L pubic ramus, admitted then d/c to a NH, back home 12-18-16 Edema, R>L U/S (-)  for DVT 01/2017  PLAN: Back pain: sxs started a week ago, better now after taking ibuprofen.  Exam is benign, no urinary or GI symptoms.  MSK?Marland Kitchen  Recommend to stop ibuprofen, okay Tylenol, will refill Flexeril (she takes on and off without getting excessively sleepy), will call if symptoms resurface. Request a ENT referral, previously seen by Dr. Laurance Flatten.  Will do.  Has ear problems.

## 2017-08-14 NOTE — Assessment & Plan Note (Signed)
Back pain: sxs started a week ago, better now after taking ibuprofen.  Exam is benign, no urinary or GI symptoms.  MSK?Marland Kitchen  Recommend to stop ibuprofen, okay Tylenol, will refill Flexeril (she takes on and off without getting excessively sleepy), will call if symptoms resurface. Request a ENT referral, previously seen by Dr. Laurance Flatten.  Will do.  Has ear problems.

## 2017-08-23 ENCOUNTER — Ambulatory Visit: Payer: Medicare HMO | Admitting: Podiatry

## 2017-08-23 ENCOUNTER — Encounter: Payer: Self-pay | Admitting: Podiatry

## 2017-08-23 DIAGNOSIS — B351 Tinea unguium: Secondary | ICD-10-CM

## 2017-08-23 DIAGNOSIS — M79676 Pain in unspecified toe(s): Secondary | ICD-10-CM

## 2017-08-23 DIAGNOSIS — Q828 Other specified congenital malformations of skin: Secondary | ICD-10-CM | POA: Diagnosis not present

## 2017-08-23 NOTE — Progress Notes (Signed)
Patient ID: Ariel Medina, female   DOB: 22-Jan-1928, 84 y.o.   MRN: [id number] Complaint:  Visit Type: Patient returns to my office for continued preventative foot care services. Complaint: Patient states" my nails have grown long and thick and become painful to walk and wear shoes" . She presents for preventative foot care services. No changes to ROS.  She has painful calluses on the bottom of both feet.  Podiatric Exam: Vascular: dorsalis pedis and posterior tibial pulses are palpable bilateral. Capillary return is immediate. Temperature gradient is WNL. Skin turgor WNL  Sensorium: Normal Semmes Weinstein monofilament test. Normal tactile sensation bilaterally. Nail Exam: Pt has thick disfigured discolored nails with subungual debris noted left foot entire nail hallux through fifth toenails and hallux right foot. Ulcer Exam: There is no evidence of ulcer or pre-ulcerative changes or infection. Orthopedic Exam: Muscle tone and strength are WNL. No limitations in general ROM. No crepitus or effusions noted. Foot type and digits show no abnormalities. Bony prominences are unremarkable. Skin:  Porokeratosis sub fifth mets B/L.Marland Kitchen No infection or ulcers  Diagnosis:  Tinea unguium, Pain in right toe, pain in left toes, Porokeratosis 5th B/L  Treatment & Plan Procedures and Treatment: Consent by patient was obtained for treatment procedures. The patient understood the discussion of treatment and procedures well. All questions were answered thoroughly reviewed. Debridement of mycotic and hypertrophic toenails, 1 through 5 bilateral and clearing of subungual debris. No ulceration, no infection noted. Debridement of porokeratosis Return Visit-Office Procedure: Patient instructed to return to the office for a follow up visit 3 months for continued evaluation and treatment.   Gardiner Barefoot DPM

## 2017-09-22 DIAGNOSIS — H9191 Unspecified hearing loss, right ear: Secondary | ICD-10-CM | POA: Diagnosis not present

## 2017-10-12 ENCOUNTER — Telehealth: Payer: Self-pay | Admitting: Internal Medicine

## 2017-10-12 NOTE — Telephone Encounter (Signed)
Copied from [place] [id number]. Topic: Quick Communication - See Telephone Encounter >> Oct 12, 2017 11:13 AM Rosalin Hawking wrote: CRM for notification. See Telephone encounter for:  10/12/17.   Pt dropped off document to be filled out by provider (Disability Parking Placard Letter - 1 page) Pt would like document to be mailed to her home address when ready: [address], [place], [place] [address]. Document put at front office tray under Providers name.

## 2017-10-14 NOTE — Telephone Encounter (Signed)
Completed as much as possible; forwarded to provider [will need pt's signature]/SLS 03/08

## 2017-10-19 NOTE — Telephone Encounter (Signed)
LMOM with contact name and number for return call, if needed RE: pt need to come in to office to p/u app for disability parking placard d/t needing her signature on Applicant Section before we can make our scan copy/SLS 03/13

## 2017-10-21 NOTE — Telephone Encounter (Signed)
Pt came by office and signed form- original given to Pt- copy sent for scanning.

## 2017-10-26 ENCOUNTER — Other Ambulatory Visit: Payer: Self-pay | Admitting: Internal Medicine

## 2017-11-23 ENCOUNTER — Encounter: Payer: Self-pay | Admitting: Podiatry

## 2017-11-23 ENCOUNTER — Ambulatory Visit (HOSPITAL_BASED_OUTPATIENT_CLINIC_OR_DEPARTMENT_OTHER)
Admission: RE | Admit: 2017-11-23 | Discharge: 2017-11-23 | Disposition: A | Discharge status: Home / Self Care | Payer: Medicare HMO | Source: Ambulatory Visit | Attending: Internal Medicine | Admitting: Internal Medicine

## 2017-11-23 ENCOUNTER — Ambulatory Visit: Payer: Medicare HMO | Admitting: Internal Medicine

## 2017-11-23 ENCOUNTER — Ambulatory Visit: Payer: Medicare HMO | Admitting: Podiatry

## 2017-11-23 ENCOUNTER — Encounter: Payer: Self-pay | Admitting: Internal Medicine

## 2017-11-23 VITALS — BP 130/79 | HR 70 | Temp 97.8°F | Resp 18 | Ht 62.0 in | Wt 129.0 lb

## 2017-11-23 DIAGNOSIS — M79676 Pain in unspecified toe(s): Secondary | ICD-10-CM | POA: Diagnosis not present

## 2017-11-23 DIAGNOSIS — Z23 Encounter for immunization: Secondary | ICD-10-CM

## 2017-11-23 DIAGNOSIS — E785 Hyperlipidemia, unspecified: Secondary | ICD-10-CM

## 2017-11-23 DIAGNOSIS — Z780 Asymptomatic menopausal state: Secondary | ICD-10-CM | POA: Diagnosis not present

## 2017-11-23 DIAGNOSIS — R634 Abnormal weight loss: Secondary | ICD-10-CM

## 2017-11-23 DIAGNOSIS — I10 Essential (primary) hypertension: Secondary | ICD-10-CM | POA: Diagnosis not present

## 2017-11-23 DIAGNOSIS — M810 Age-related osteoporosis without current pathological fracture: Secondary | ICD-10-CM | POA: Insufficient documentation

## 2017-11-23 DIAGNOSIS — Q828 Other specified congenital malformations of skin: Secondary | ICD-10-CM

## 2017-11-23 DIAGNOSIS — B351 Tinea unguium: Secondary | ICD-10-CM | POA: Diagnosis not present

## 2017-11-23 DIAGNOSIS — E039 Hypothyroidism, unspecified: Secondary | ICD-10-CM | POA: Diagnosis not present

## 2017-11-23 LAB — TSH: TSH: 3.15 u[IU]/mL (ref 0.35–4.50)

## 2017-11-23 NOTE — Progress Notes (Signed)
Patient ID: Ariel Medina, female   DOB: [birth year], 84 y.o.   MRN: [id number] Complaint:  Visit Type: Patient returns to my office for continued preventative foot care services. Complaint: Patient states" my nails have grown long and thick and become painful to walk and wear shoes" . She presents for preventative foot care services. No changes to ROS.  She has painful calluses on the bottom of both feet.  Podiatric Exam: Vascular: dorsalis pedis and posterior tibial pulses are palpable bilateral. Capillary return is immediate. Temperature gradient is WNL. Skin turgor WNL  Sensorium: Normal Semmes Weinstein monofilament test. Normal tactile sensation bilaterally. Nail Exam: Pt has thick disfigured discolored nails with subungual debris noted left foot entire nail hallux through fifth toenails and hallux right foot. Ulcer Exam: There is no evidence of ulcer or pre-ulcerative changes or infection. Orthopedic Exam: Muscle tone and strength are WNL. No limitations in general ROM. No crepitus or effusions noted. Foot type and digits show no abnormalities. Bony prominences are unremarkable. Skin:  Porokeratosis sub fifth mets B/L.Marland Kitchen No infection or ulcers  Diagnosis:  Tinea unguium, Pain in right toe, pain in left toes, Porokeratosis 5th B/L  Treatment & Plan Procedures and Treatment: Consent by patient was obtained for treatment procedures. The patient understood the discussion of treatment and procedures well. All questions were answered thoroughly reviewed. Debridement of mycotic and hypertrophic toenails, 1 through 5 bilateral and clearing of subungual debris. No ulceration, no infection noted. Debridement of porokeratosis Return Visit-Office Procedure: Patient instructed to return to the office for a follow up visit 3 months for continued evaluation and treatment.   Gardiner Barefoot DPM

## 2017-11-23 NOTE — Patient Instructions (Addendum)
GO TO THE LAB : Get the blood work     GO TO THE FRONT DESK Schedule your next appointment for a physical exam, fasting in 6 months  Fall Prevention in the Home Falls can cause injuries and can affect people from all age groups. There are many simple things that you can do to make your home safe and to help prevent falls. What can I do on the outside of my home?  Regularly repair the edges of walkways and driveways and fix any cracks.  Remove high doorway thresholds.  Trim any shrubbery on the main path into your home.  Use bright outdoor lighting.  Clear walkways of debris and clutter, including tools and rocks.  Regularly check that handrails are securely fastened and in good repair. Both sides of any steps should have handrails.  Install guardrails along the edges of any raised decks or porches.  Have leaves, snow, and ice cleared regularly.  Use sand or salt on walkways during winter months.  In the garage, clean up any spills right away, including grease or oil spills. What can I do in the bathroom?  Use night lights.  Install grab bars by the toilet and in the tub and shower. Do not use towel bars as grab bars.  Use non-skid mats or decals on the floor of the tub or shower.  If you need to sit down while you are in the shower, use a plastic, non-slip stool.  Keep the floor dry. Immediately clean up any water that spills on the floor.  Remove soap buildup in the tub or shower on a regular basis.  Attach bath mats securely with double-sided non-slip rug tape.  Remove throw rugs and other tripping hazards from the floor. What can I do in the bedroom?  Use night lights.  Make sure that a bedside light is easy to reach.  Do not use oversized bedding that drapes onto the floor.  Have a firm chair that has side arms to use for getting dressed.  Remove throw rugs and other tripping hazards from the floor. What can I do in the kitchen?  Clean up any spills  right away.  Avoid walking on wet floors.  Place frequently used items in easy-to-reach places.  If you need to reach for something above you, use a sturdy step stool that has a grab bar.  Keep electrical cables out of the way.  Do not use floor polish or wax that makes floors slippery. If you have to use wax, make sure that it is non-skid floor wax.  Remove throw rugs and other tripping hazards from the floor. What can I do in the stairways?  Do not leave any items on the stairs.  Make sure that there are handrails on both sides of the stairs. Fix handrails that are broken or loose. Make sure that handrails are as long as the stairways.  Check any carpeting to make sure that it is firmly attached to the stairs. Fix any carpet that is loose or worn.  Avoid having throw rugs at the top or bottom of stairways, or secure the rugs with carpet tape to prevent them from moving.  Make sure that you have a light switch at the top of the stairs and the bottom of the stairs. If you do not have them, have them installed. What are some other fall prevention tips?  Wear closed-toe shoes that fit well and support your feet. Wear shoes that have rubber soles or  low heels.  When you use a stepladder, make sure that it is completely opened and that the sides are firmly locked. Have someone hold the ladder while you are using it. Do not climb a closed stepladder.  Add color or contrast paint or tape to grab bars and handrails in your home. Place contrasting color strips on the first and last steps.  Use mobility aids as needed, such as canes, walkers, scooters, and crutches.  Turn on lights if it is dark. Replace any light bulbs that burn out.  Set up furniture so that there are clear paths. Keep the furniture in the same spot.  Fix any uneven floor surfaces.  Choose a carpet design that does not hide the edge of steps of a stairway.  Be aware of any and all pets.  Review your medicines  with your healthcare provider. Some medicines can cause dizziness or changes in blood pressure, which increase your risk of falling. Talk with your health care provider about other ways that you can decrease your risk of falls. This may include working with a physical therapist or trainer to improve your strength, balance, and endurance. This information is not intended to replace advice given to you by your health care provider. Make sure you discuss any questions you have with your health care provider. Document Released: 07/16/2002 Document Revised: 12/23/2015 Document Reviewed: 08/30/2014 Elsevier Interactive Patient Education  [employer].

## 2017-11-23 NOTE — Progress Notes (Signed)
Subjective:    Patient ID: Ariel Medina, female    DOB: 1928-03-01, 84 y.o.   MRN: [id number]  DOS:  11/23/2017 Type of visit - description : f/u Interval history: HTN: Good compliance to medication. High cholesterol: Good compliance with meds, occasionally has leg cramps but no myalgias. Went to see the ENT doctor, note reviewed.  Wt Readings from Last 3 Encounters:  11/23/17 129 lb (58.5 kg)  08/12/17 127 lb 2 oz (57.7 kg)  07/25/17 130 lb 4 oz (59.1 kg)     Review of Systems Denies chest pain, difficulty breathing, edema. No recent falls.  Past Medical History:  Diagnosis Date  . Breast CA ([place])    left breast-invasive ductal ca stage 1-stopped arimidex 2008  . Diverticulitis of colon   . Hiatal hernia   . Hyperlipidemia   . Hypertension   . Hypothyroidism   . Osteoarthritis   . Osteopenia    DEXA 7/02  . Syncope 2005   CT head (-), ECHO essent. neg, stress test (-), carotid u/s (-)    Past Surgical History:  Procedure Laterality Date  . BREAST BIOPSY Bilateral   . BREAST LUMPECTOMY  04/2002   left  . TONSILLECTOMY AND ADENOIDECTOMY      Social History   Socioeconomic History  . Marital status: Widowed    Spouse name: Not on file  . Number of children: 2  . Years of education: Not on file  . Highest education level: Not on file  Occupational History  . Occupation: n/a    Employer: RETIRED  Social Needs  . Financial resource strain: Not on file  . Food insecurity:    Worry: Not on file    Inability: Not on file  . Transportation needs:    Medical: Not on file    Non-medical: Not on file  Tobacco Use  . Smoking status: Never Smoker  . Smokeless tobacco: Never Used  Substance and Sexual Activity  . Alcohol use: No  . Drug use: No  . Sexual activity: Not on file  Lifestyle  . Physical activity:    Days per week: Not on file    Minutes per session: Not on file  . Stress: Not on file  Relationships  . Social connections:    Talks on phone:  Not on file    Gets together: Not on file    Attends religious service: Not on file    Active member of club or organization: Not on file    Attends meetings of clubs or organizations: Not on file    Relationship status: Not on file  . Intimate partner violence:    Fear of current or ex partner: Not on file    Emotionally abused: Not on file    Physically abused: Not on file    Forced sexual activity: Not on file  Other Topics Concern  . Not on file  Social History Narrative   Lost a son 44   Her other son lives w/ her on-off [name], h/o etoh, he is  one of my patients)   Sister live next door , does help the pt    G-son @ [employer] , G-daughter @ [employer] since 1993   still drives       Allergies as of 11/23/2017      Reactions   Ibuprofen    REACTION: bleed      Medication List        Accurate  as of 11/23/17 11:59 PM. Always use your most recent med list.          acetaminophen 500 MG tablet Commonly known as:  TYLENOL Take 500 mg by mouth every 6 (six) hours as needed for mild pain, moderate pain or headache.   aspirin 81 MG tablet Take 81 mg by mouth every morning.   CALTRATE 600+D 600-400 MG-UNIT tablet Generic drug:  Calcium Carbonate-Vitamin D Take 1 tablet by mouth daily.   cyclobenzaprine 5 MG tablet Commonly known as:  FLEXERIL Take 1 tablet (5 mg total) by mouth 2 (two) times daily as needed for muscle spasms.   fluticasone 50 MCG/ACT nasal spray Commonly known as:  FLONASE Place 2 sprays into both nostrils daily as needed for allergies or rhinitis.   hydrochlorothiazide 25 MG tablet Commonly known as:  HYDRODIURIL Take 1 tablet (25 mg total) by mouth daily.   ICAPS AREDS 2 PO Take 1 capsule by mouth 2 (two) times daily.   levothyroxine 75 MCG tablet Commonly known as:  SYNTHROID, LEVOTHROID Take 1 tablet (75 mcg total) by mouth daily before breakfast.   polyethylene glycol packet Commonly known as:  MIRALAX /  GLYCOLAX Take 17 g by mouth daily as needed for moderate constipation.   potassium chloride 10 MEQ tablet Commonly known as:  K-DUR,KLOR-CON Take 1 tablet (10 mEq total) by mouth daily.   rosuvastatin 5 MG tablet Commonly known as:  CRESTOR Take 1 tablet (5 mg total) by mouth daily.          Objective:   Physical Exam BP 130/79 (BP Location: Right Arm, Patient Position: Sitting, Cuff Size: Small)   Pulse 70   Temp 97.8 F (36.6 C) (Oral)   Resp 18   Ht 5\' 2"  (1.575 m)   Wt 129 lb (58.5 kg)   SpO2 96%   BMI 23.59 kg/m  General:   Well developed, elderly lady, NAD.  HEENT:  Normocephalic . Face symmetric, atraumatic Lungs:  CTA B Normal respiratory effort, no intercostal retractions, no accessory muscle use. Heart: RRR,  no murmur.  no pretibial edema bilaterally  Abdomen:  Not distended, soft, non-tender. No bruit Skin: Not pale. Not jaundice Neurologic:  alert & oriented X3.  Speech normal, gait appropriate for age, assisted by a cane, needed help transferring to the table Psych--  Cognition and judgment appear intact.  Cooperative with normal attention span and concentration.  Behavior appropriate. No anxious or depressed appearing.     Assessment & Plan:   Assessment  HTN Hyperlipidemia Hypothyroidism Osteopenia:  --T score -2.1 (04-2012) DC Fosamax 12/2013 after 5 years --T score -1.5   (02-2015)  DJD GERD  Tremors, likely essential H/o diverticulitis Breast cancer, L breast, release from oncology 2013 Syncope 2005 11-2016: Fall, FX L pubic ramus, admitted then d/c to a NH, back home 12-18-16 Edema, R>L U/S (-)  for DVT 01/2017  PLAN: HTN: On potassium, HCTZ, last BMP satisfactory.  No change Hyperlipidemia: Well-controlled on Crestor Hypothyroidism: Continue Synthroid, check a TSH Osteopenia: On vitamin D, calcium, offered follow-up DEXA Weight loss: Resolved, gaining 2 pounds Preventive care, PNM 23 today, fall prevention discussed RTC 6 months

## 2017-11-24 ENCOUNTER — Telehealth: Payer: Self-pay

## 2017-11-24 NOTE — Telephone Encounter (Signed)
Benefits verified. PA required. Initiated via Covermymeds; KEY: [id number]. Awaiting determination.

## 2017-11-25 NOTE — Assessment & Plan Note (Signed)
HTN: On potassium, HCTZ, last BMP satisfactory.  No change Hyperlipidemia: Well-controlled on Crestor Hypothyroidism: Continue Synthroid, check a TSH Osteopenia: On vitamin D, calcium, offered follow-up DEXA Weight loss: Resolved, gaining 2 pounds Preventive care, PNM 23 today, fall prevention discussed RTC 6 months

## 2017-11-28 NOTE — Telephone Encounter (Signed)
Ariel Medina- can you order Prolia for Pt?

## 2017-11-28 NOTE — Telephone Encounter (Signed)
PA denied under Part D coverage, however approved for Part B coverage for physicians office to administer for 2 years.

## 2017-11-28 NOTE — Telephone Encounter (Signed)
Spoke w/ Pt- informed of bone density results and recommendations. Informed of approximate out of pocket cost of Prolia, ~250 dollars every 6 months. Pt has agreed to start medication. Nurse visit scheduled for 12/06/2017.

## 2017-11-28 NOTE — Addendum Note (Signed)
Addended by: Jiles Prows on: 11/28/2017 11:22 AM   Modules accepted: Orders

## 2017-11-29 NOTE — Telephone Encounter (Signed)
Medication is in fridge for pt.

## 2017-12-05 ENCOUNTER — Other Ambulatory Visit: Payer: Self-pay | Admitting: Internal Medicine

## 2017-12-06 ENCOUNTER — Ambulatory Visit (INDEPENDENT_AMBULATORY_CARE_PROVIDER_SITE_OTHER): Payer: Medicare HMO

## 2017-12-06 DIAGNOSIS — M818 Other osteoporosis without current pathological fracture: Secondary | ICD-10-CM

## 2017-12-06 MED ORDER — DENOSUMAB 60 MG/ML ~~LOC~~ SOSY
60.0000 mg | PREFILLED_SYRINGE | Freq: Once | SUBCUTANEOUS | Status: AC
  Administered 2017-12-06: 60 mg via SUBCUTANEOUS

## 2017-12-06 NOTE — Progress Notes (Addendum)
Patient came in today to have her first Prolia injection.  She tolerated 60mg /mL SQ in her right arm. No complications.   Patient has next appointment with PCP on 05/25/18, will need repeat blood work for her next Prolia injection on 06/13/18.   Patient made aware   Kathlene November, MD

## 2017-12-06 NOTE — Addendum Note (Signed)
Addended by: Magdalene Molly A on: 12/06/2017 12:16 PM   Modules accepted: Level of Service

## 2017-12-27 ENCOUNTER — Other Ambulatory Visit: Payer: Self-pay | Admitting: Internal Medicine

## 2018-02-22 ENCOUNTER — Encounter: Payer: Self-pay | Admitting: Podiatry

## 2018-02-22 ENCOUNTER — Ambulatory Visit: Payer: Medicare HMO | Admitting: Podiatry

## 2018-02-22 DIAGNOSIS — M79676 Pain in unspecified toe(s): Secondary | ICD-10-CM | POA: Diagnosis not present

## 2018-02-22 DIAGNOSIS — B351 Tinea unguium: Secondary | ICD-10-CM | POA: Diagnosis not present

## 2018-02-22 DIAGNOSIS — Q828 Other specified congenital malformations of skin: Secondary | ICD-10-CM

## 2018-02-22 NOTE — Progress Notes (Signed)
Patient ID: Ariel Medina, female   DOB: 11-03-1927, 84 y.o.   MRN: [id number] Complaint:  Visit Type: Patient returns to my office for continued preventative foot care services. Complaint: Patient states" my nails have grown long and thick and become painful to walk and wear shoes" . She presents for preventative foot care services. No changes to ROS.  She has painful calluses on the bottom of both feet.  Podiatric Exam: Vascular: dorsalis pedis and posterior tibial pulses are palpable bilateral. Capillary return is immediate. Temperature gradient is WNL. Skin turgor WNL  Sensorium: Normal Semmes Weinstein monofilament test. Normal tactile sensation bilaterally. Nail Exam: Pt has thick disfigured discolored nails with subungual debris noted left foot entire nail hallux through fifth toenails and hallux right foot. Ulcer Exam: There is no evidence of ulcer or pre-ulcerative changes or infection. Orthopedic Exam: Muscle tone and strength are WNL. No limitations in general ROM. No crepitus or effusions noted. Foot type and digits show no abnormalities. Bony prominences are unremarkable. Skin:  Porokeratosis sub fifth mets B/L.Marland Kitchen No infection or ulcers  Diagnosis:  Tinea unguium, Pain in right toe, pain in left toes, Porokeratosis 5th B/L  Treatment & Plan Procedures and Treatment: Consent by patient was obtained for treatment procedures. The patient understood the discussion of treatment and procedures well. All questions were answered thoroughly reviewed. Debridement of mycotic and hypertrophic toenails, 1 through 5 bilateral and clearing of subungual debris. No ulceration, no infection noted. Debridement of porokeratosis Return Visit-Office Procedure: Patient instructed to return to the office for a follow up visit 3 months for continued evaluation and treatment.   Gardiner Barefoot DPM

## 2018-03-13 ENCOUNTER — Other Ambulatory Visit: Payer: Self-pay | Admitting: Internal Medicine

## 2018-03-27 NOTE — Progress Notes (Deleted)
Subjective:   Ariel Medina is a 84 y.o. female who presents for Medicare Annual (Subsequent) preventive examination.  Review of Systems: No ROS.  Medicare Wellness Visit. Additional risk factors are reflected in the social history.   Sleep patterns:  Home Safety/Smoke Alarms: Feels safe in home. Smoke alarms in place.  Living environment:   Female:        Mammo-utd       Dexa scan-utd            Objective:     Vitals: There were no vitals taken for this visit.  There is no height or weight on file to calculate BMI.  Advanced Directives 12/16/2016 12/01/2016 11/29/2016 11/28/2016  Does Patient Have a Medical Advance Directive? No No Yes Yes  Type of Advance Directive - - Public librarian;Living will  Does patient want to make changes to medical advance directive? - - No - Patient declined -  Copy of [place] in Chart? - - - No - copy requested    Tobacco Social History   Tobacco Use  Smoking Status Never Smoker  Smokeless Tobacco Never Used     Counseling given: Not Answered   Clinical Intake:                       Past Medical History:  Diagnosis Date  . Breast CA ([place])    left breast-invasive ductal ca stage 1-stopped arimidex 2008  . Diverticulitis of colon   . Hiatal hernia   . Hyperlipidemia   . Hypertension   . Hypothyroidism   . Osteoarthritis   . Osteopenia    DEXA 7/02  . Syncope 2005   CT head (-), ECHO essent. neg, stress test (-), carotid u/s (-)   Past Surgical History:  Procedure Laterality Date  . BREAST BIOPSY Bilateral   . BREAST LUMPECTOMY  04/2002   left  . TONSILLECTOMY AND ADENOIDECTOMY     Family History  Problem Relation Age of Onset  . Heart attack Brother 28  . Diabetes Mother   . Stroke Father 21  . Stroke Sister 8  . Colon cancer Neg Hx   . Breast cancer Neg Hx    Social History   Socioeconomic History  . Marital status: Widowed    Spouse name: Not on file  . Number  of children: 2  . Years of education: Not on file  . Highest education level: Not on file  Occupational History  . Occupation: n/a    Employer: RETIRED  Social Needs  . Financial resource strain: Not on file  . Food insecurity:    Worry: Not on file    Inability: Not on file  . Transportation needs:    Medical: Not on file    Non-medical: Not on file  Tobacco Use  . Smoking status: Never Smoker  . Smokeless tobacco: Never Used  Substance and Sexual Activity  . Alcohol use: No  . Drug use: No  . Sexual activity: Not on file  Lifestyle  . Physical activity:    Days per week: Not on file    Minutes per session: Not on file  . Stress: Not on file  Relationships  . Social connections:    Talks on phone: Not on file    Gets together: Not on file    Attends religious service: Not on file    Active member of club or organization: Not on file  Attends meetings of clubs or organizations: Not on file    Relationship status: Not on file  Other Topics Concern  . Not on file  Social History Narrative   Lost a son 55   Her other son lives w/ her on-off Ariel Medina, h/o etoh, he is  one of my patients)   Sister live next door , does help the pt    G-son @ [employer] , G-daughter @ [employer] since 1993   still drives     Outpatient Encounter Medications as of 03/28/2018  Medication Sig  . acetaminophen (TYLENOL) 500 MG tablet Take 500 mg by mouth every 6 (six) hours as needed for mild pain, moderate pain or headache.   Marland Kitchen aspirin 81 MG tablet Take 81 mg by mouth every morning.   . Calcium Carbonate-Vitamin D (CALTRATE 600+D) 600-400 MG-UNIT tablet Take 1 tablet by mouth daily.  . cyclobenzaprine (FLEXERIL) 5 MG tablet Take 1 tablet (5 mg total) by mouth 2 (two) times daily as needed for muscle spasms.  . fluticasone (FLONASE) 50 MCG/ACT nasal spray Place 2 sprays into both nostrils daily as needed for allergies or rhinitis.  . hydrochlorothiazide (HYDRODIURIL) 25 MG  tablet Take 1 tablet (25 mg total) by mouth daily.  Marland Kitchen levothyroxine (SYNTHROID, LEVOTHROID) 75 MCG tablet Take 1 tablet (75 mcg total) by mouth daily before breakfast.  . Multiple Vitamins-Minerals (ICAPS AREDS 2 PO) Take 1 capsule by mouth 2 (two) times daily.  . polyethylene glycol (MIRALAX / GLYCOLAX) packet Take 17 g by mouth daily as needed for moderate constipation.  . potassium chloride (K-DUR,KLOR-CON) 10 MEQ tablet Take 1 tablet (10 mEq total) by mouth daily.  . rosuvastatin (CRESTOR) 5 MG tablet Take 1 tablet (5 mg total) by mouth daily.   No facility-administered encounter medications on file as of 03/28/2018.     Activities of Daily Living No flowsheet data found.  Patient Care Team: Colon Branch, MD as PCP - General Ernesto Rutherford Maudry Mayhew, MD as Consulting Physician (Otolaryngology) Gardiner Barefoot, DPM as Consulting Physician (Podiatry)    Assessment:   This is a routine wellness examination for Ariel Medina. Physical assessment deferred to PCP.  Exercise Activities and Dietary recommendations   Diet (meal preparation, eat out, water intake, caffeinated beverages, dairy products, fruits and vegetables): {Desc; diets:16563} Breakfast: Lunch:  Dinner:      Goals   None     Fall Risk Fall Risk  07/25/2017 07/20/2016 05/13/2015 06/10/2014 06/07/2013  Falls in the past year? No No No No No   Depression Screen PHQ 2/9 Scores 07/25/2017 07/20/2016 05/13/2015 06/10/2014  PHQ - 2 Score 0 0 0 0     Cognitive Function        Immunization History  Administered Date(s) Administered  . H1N1 08/29/2008  . Influenza Whole 05/12/2010  . Influenza, High Dose Seasonal PF 06/07/2013, 05/13/2015, 07/20/2016, 04/20/2017  . Influenza,inj,Quad PF,6+ Mos 06/10/2014  . PPD Test 12/14/2016  . Pneumococcal Conjugate-13 06/10/2014  . Pneumococcal Polysaccharide-23 07/10/2003, 02/21/2008, 11/23/2017  . Td 05/16/2007  . Zoster 12/13/2012   Screening Tests Health Maintenance  Topic Date Due   . INFLUENZA VACCINE  05/09/2018 (Originally 03/09/2018)  . TETANUS/TDAP  08/12/2018 (Originally 05/15/2017)  . MAMMOGRAM  05/31/2019  . DEXA SCAN  Completed  . PNA vac Low Risk Adult  Completed      Plan:   ***   I have personally reviewed and noted the following in the patient's chart:   . Medical  and social history . Use of alcohol, tobacco or illicit drugs  . Current medications and supplements . Functional ability and status . Nutritional status . Physical activity . Advanced directives . List of other physicians . Hospitalizations, surgeries, and ER visits in previous 12 months . Vitals . Screenings to include cognitive, depression, and falls . Referrals and appointments  In addition, I have reviewed and discussed with patient certain preventive protocols, quality metrics, and best practice recommendations. A written personalized care plan for preventive services as well as general preventive health recommendations were provided to patient.     Shela Nevin, [place]  03/27/2018

## 2018-03-28 ENCOUNTER — Ambulatory Visit: Payer: Medicare HMO | Admitting: *Deleted

## 2018-05-09 ENCOUNTER — Other Ambulatory Visit: Payer: Self-pay | Admitting: Internal Medicine

## 2018-05-09 DIAGNOSIS — Z1231 Encounter for screening mammogram for malignant neoplasm of breast: Secondary | ICD-10-CM

## 2018-05-25 ENCOUNTER — Encounter: Payer: Self-pay | Admitting: Internal Medicine

## 2018-05-25 ENCOUNTER — Ambulatory Visit (INDEPENDENT_AMBULATORY_CARE_PROVIDER_SITE_OTHER): Payer: Medicare HMO | Admitting: Internal Medicine

## 2018-05-25 VITALS — BP 126/62 | HR 69 | Temp 98.1°F | Resp 18 | Ht 62.0 in | Wt 130.4 lb

## 2018-05-25 DIAGNOSIS — E039 Hypothyroidism, unspecified: Secondary | ICD-10-CM | POA: Diagnosis not present

## 2018-05-25 DIAGNOSIS — I10 Essential (primary) hypertension: Secondary | ICD-10-CM

## 2018-05-25 DIAGNOSIS — Z23 Encounter for immunization: Secondary | ICD-10-CM

## 2018-05-25 DIAGNOSIS — E785 Hyperlipidemia, unspecified: Secondary | ICD-10-CM

## 2018-05-25 LAB — CBC WITH DIFFERENTIAL/PLATELET
BASOS PCT: 1.1 % (ref 0.0–3.0)
Basophils Absolute: 0.1 10*3/uL (ref 0.0–0.1)
EOS ABS: 0.1 10*3/uL (ref 0.0–0.7)
Eosinophils Relative: 1.6 % (ref 0.0–5.0)
HCT: 44.6 % (ref 36.0–46.0)
Hemoglobin: 14.8 g/dL (ref 12.0–15.0)
LYMPHS ABS: 1.2 10*3/uL (ref 0.7–4.0)
Lymphocytes Relative: 21.6 % (ref 12.0–46.0)
MCHC: 33.2 g/dL (ref 30.0–36.0)
MCV: 96.1 fl (ref 78.0–100.0)
Monocytes Absolute: 0.6 10*3/uL (ref 0.1–1.0)
Monocytes Relative: 10.5 % (ref 3.0–12.0)
NEUTROS ABS: 3.6 10*3/uL (ref 1.4–7.7)
NEUTROS PCT: 65.2 % (ref 43.0–77.0)
PLATELETS: 236 10*3/uL (ref 150.0–400.0)
RBC: 4.64 Mil/uL (ref 3.87–5.11)
RDW: 14.4 % (ref 11.5–15.5)
WBC: 5.4 10*3/uL (ref 4.0–10.5)

## 2018-05-25 LAB — LIPID PANEL
CHOL/HDL RATIO: 3
Cholesterol: 195 mg/dL (ref 0–200)
HDL: 62.6 mg/dL (ref >39.00)
LDL Cholesterol: 117 mg/dL — ABNORMAL HIGH (ref 0–99)
NonHDL: 132.33
TRIGLYCERIDES: 78 mg/dL (ref 0.0–149.0)
VLDL: 15.6 mg/dL (ref 0.0–40.0)

## 2018-05-25 LAB — COMPREHENSIVE METABOLIC PANEL
ALT: 6 U/L (ref 0–35)
AST: 14 U/L (ref 0–37)
Albumin: 3.9 g/dL (ref 3.5–5.2)
Alkaline Phosphatase: 37 U/L — ABNORMAL LOW (ref 39–117)
BILIRUBIN TOTAL: 0.6 mg/dL (ref 0.2–1.2)
BUN: 14 mg/dL (ref 6–23)
CHLORIDE: 98 meq/L (ref 96–112)
CO2: 33 meq/L — AB (ref 19–32)
CREATININE: 0.72 mg/dL (ref 0.40–1.20)
Calcium: 9.4 mg/dL (ref 8.4–10.5)
GFR: 81.23 mL/min (ref >60.00)
GLUCOSE: 57 mg/dL — AB (ref 70–99)
Potassium: 3.8 mEq/L (ref 3.5–5.1)
Sodium: 138 mEq/L (ref 135–145)
Total Protein: 6.2 g/dL (ref 6.0–8.3)

## 2018-05-25 LAB — TSH: TSH: 2.34 u[IU]/mL (ref 0.35–4.50)

## 2018-05-25 MED ORDER — ZOSTER VAC RECOMB ADJUVANTED 50 MCG/0.5ML IM SUSR: 0.5000 mL | Freq: Once | INTRAMUSCULAR | 1 refills | Status: AC

## 2018-05-25 NOTE — Assessment & Plan Note (Signed)
Td, flu shot today. Shingrix pro/cons discussed, RX printed MMG schedule for this month

## 2018-05-25 NOTE — Patient Instructions (Signed)
GO TO THE LAB : Get the blood work     GO TO THE FRONT DESK Schedule your next appointment for a   checkup in 6 months 

## 2018-05-25 NOTE — Progress Notes (Signed)
Pre visit review using our clinic review tool, if applicable. No additional management support is needed unless otherwise documented below in the visit note. 

## 2018-05-25 NOTE — Progress Notes (Signed)
Subjective:    Patient ID: Ariel Medina, female    DOB: Dec 23, 1927, 84 y.o.   MRN: [id number]  DOS:  05/25/2018 Type of visit - description : rov Interval history: Hypothyroidism: Good compliance with medication, due for labs High cholesterol: On Crestor, due for labs History of breast cancer, has a mammogram scheduled HTN: Good compliance with medications, no ambulatory BPs.  Wt Readings from Last 3 Encounters:  05/25/18 130 lb 6 oz (59.1 kg)  11/23/17 129 lb (58.5 kg)  08/12/17 127 lb 2 oz (57.7 kg)    Review of Systems  Denies chest pain or difficulty breathing. Uses a cane. Still lives independently. Denies nausea or vomiting.  Past Medical History:  Diagnosis Date  . Breast CA ([place])    left breast-invasive ductal ca stage 1-stopped arimidex 2008  . Diverticulitis of colon   . Hiatal hernia   . Hyperlipidemia   . Hypertension   . Hypothyroidism   . Osteoarthritis   . Osteopenia    DEXA 7/02  . Syncope 2005   CT head (-), ECHO essent. neg, stress test (-), carotid u/s (-)    Past Surgical History:  Procedure Laterality Date  . BREAST BIOPSY Bilateral   . BREAST LUMPECTOMY  04/2002   left  . TONSILLECTOMY AND ADENOIDECTOMY      Social History   Socioeconomic History  . Marital status: Widowed    Spouse name: Not on file  . Number of children: 2  . Years of education: Not on file  . Highest education level: Not on file  Occupational History  . Occupation: n/a    Employer: RETIRED  Social Needs  . Financial resource strain: Not on file  . Food insecurity:    Worry: Not on file    Inability: Not on file  . Transportation needs:    Medical: Not on file    Non-medical: Not on file  Tobacco Use  . Smoking status: Never Smoker  . Smokeless tobacco: Never Used  Substance and Sexual Activity  . Alcohol use: No  . Drug use: No  . Sexual activity: Not Currently  Lifestyle  . Physical activity:    Days per week: Not on file    Minutes per  session: Not on file  . Stress: Not on file  Relationships  . Social connections:    Talks on phone: Not on file    Gets together: Not on file    Attends religious service: Not on file    Active member of club or organization: Not on file    Attends meetings of clubs or organizations: Not on file    Relationship status: Not on file  . Intimate partner violence:    Fear of current or ex partner: Not on file    Emotionally abused: Not on file    Physically abused: Not on file    Forced sexual activity: Not on file  Other Topics Concern  . Not on file  Social History Narrative   Lost a son 79   Her other son lives w/ her on-off [name], h/o etoh, he is  one of my patients)   Sister live next door , does help the pt    G-son @ [employer] , G-daughter @ [employer] since 1993   still drives       Allergies as of 05/25/2018      Reactions   Ibuprofen    REACTION: bleed  Medication List        Accurate as of 05/25/18 11:59 PM. Always use your most recent med list.          acetaminophen 500 MG tablet Commonly known as:  TYLENOL Take 500 mg by mouth every 6 (six) hours as needed for mild pain, moderate pain or headache.   aspirin 81 MG tablet Take 81 mg by mouth every morning.   CALTRATE 600+D 600-400 MG-UNIT tablet Generic drug:  Calcium Carbonate-Vitamin D Take 1 tablet by mouth daily.   cyclobenzaprine 5 MG tablet Commonly known as:  FLEXERIL Take 1 tablet (5 mg total) by mouth 2 (two) times daily as needed for muscle spasms.   fluticasone 50 MCG/ACT nasal spray Commonly known as:  FLONASE Place 2 sprays into both nostrils daily as needed for allergies or rhinitis.   hydrochlorothiazide 25 MG tablet Commonly known as:  HYDRODIURIL Take 1 tablet (25 mg total) by mouth daily.   ICAPS AREDS 2 PO Take 1 capsule by mouth 2 (two) times daily.   levothyroxine 75 MCG tablet Commonly known as:  SYNTHROID, LEVOTHROID Take 1 tablet (75 mcg total)  by mouth daily before breakfast.   polyethylene glycol packet Commonly known as:  MIRALAX / GLYCOLAX Take 17 g by mouth daily as needed for moderate constipation.   potassium chloride 10 MEQ tablet Commonly known as:  K-DUR,KLOR-CON Take 1 tablet (10 mEq total) by mouth daily.   rosuvastatin 5 MG tablet Commonly known as:  CRESTOR Take 1 tablet (5 mg total) by mouth daily.   Zoster Vaccine Adjuvanted injection Commonly known as:  SHINGRIX Inject 0.5 mLs into the muscle once for 1 dose.          Objective:   Physical Exam BP 126/62 (BP Location: Left Arm, Patient Position: Sitting, Cuff Size: Small)   Pulse 69   Temp 98.1 F (36.7 C) (Oral)   Resp 18   Ht 5\' 2"  (1.575 m)   Wt 130 lb 6 oz (59.1 kg)   SpO2 95%   BMI 23.85 kg/m  General:   Well developed, NAD, see BMI.  HEENT:  Normocephalic . Face symmetric, atraumatic Lungs:  Slightly decreased breath sounds, pursed lip breathing (a chronic finding) Normal respiratory effort, no intercostal retractions, no accessory muscle use. Heart: RRR,  no murmur.  + edema R>L @ baseline  Skin: Not pale. Not jaundice Neurologic:  alert & oriented X3.  Speech normal, gait is difficult, at baseline, assisted by a cane Psych--  Cognition and judgment appear intact.  Cooperative with normal attention span and concentration.  Behavior appropriate. No anxious or depressed appearing.      Assessment & Plan:   Assessment  HTN Hyperlipidemia Hypothyroidism Osteopenia:  --T score -2.1 (04-2012) DC Fosamax 12/2013 after 5 years --T score -1.5   (02-2015)  DJD GERD  Tremors, likely essential H/o diverticulitis Breast cancer, L breast, release from oncology 2013 Syncope 2005 11-2016: Fall, FX L pubic ramus, admitted then d/c to a NH, back home 12-18-16 Edema, R>L U/S (-)  for DVT 01/2017  PLAN: HTN: On hydrochlorothiazide, potassium.  Check a CMP and CBC High cholesterol: On Crestor, check a FLP Hypothyroidism: Good  compliance with thyroid medicine, check a TSH. Osteopenia: T score -3.6 (april 2019) on Prolia Social: Lives by herself, still drives, uses a cane regularly, independent on all ADLs. Preventive care discussed  RTC 6 months

## 2018-05-26 NOTE — Assessment & Plan Note (Signed)
HTN: On hydrochlorothiazide, potassium.  Check a CMP and CBC High cholesterol: On Crestor, check a FLP Hypothyroidism: Good compliance with thyroid medicine, check a TSH. Osteopenia: T score -3.6 (april 2019) on Prolia Social: Lives by herself, still drives, uses a cane regularly, independent on all ADLs. Preventive care discussed  RTC 6 months

## 2018-05-31 ENCOUNTER — Encounter: Payer: Self-pay | Admitting: Podiatry

## 2018-05-31 ENCOUNTER — Ambulatory Visit: Payer: Medicare HMO | Admitting: Podiatry

## 2018-05-31 DIAGNOSIS — M79676 Pain in unspecified toe(s): Secondary | ICD-10-CM | POA: Diagnosis not present

## 2018-05-31 DIAGNOSIS — B351 Tinea unguium: Secondary | ICD-10-CM | POA: Diagnosis not present

## 2018-05-31 NOTE — Progress Notes (Signed)
Patient ID: Ariel Medina, female   DOB: [birth year], 84 y.o.   MRN: [id number] Complaint:  Visit Type: Patient returns to my office for continued preventative foot care services. Complaint: Patient states" my nails have grown long and thick and become painful to walk and wear shoes" . She presents for preventative foot care services. No changes to ROS.  She has painful calluses on the bottom of both feet.  Podiatric Exam: Vascular: dorsalis pedis and posterior tibial pulses are palpable bilateral. Capillary return is immediate. Temperature gradient is WNL. Skin turgor WNL  Sensorium: Normal Semmes Weinstein monofilament test. Normal tactile sensation bilaterally. Nail Exam: Pt has thick disfigured discolored nails with subungual debris noted left foot entire nail hallux through fifth toenails and hallux right foot. Ulcer Exam: There is no evidence of ulcer or pre-ulcerative changes or infection. Orthopedic Exam: Muscle tone and strength are WNL. No limitations in general ROM. No crepitus or effusions noted. Foot type and digits show no abnormalities. Bony prominences are unremarkable. Skin:  Porokeratosis sub fifth mets B/L.Marland Kitchen No infection or ulcers  Diagnosis:  Tinea unguium, Pain in right toe, pain in left toes,   Treatment & Plan Procedures and Treatment: Consent by patient was obtained for treatment procedures. The patient understood the discussion of treatment and procedures well. All questions were answered thoroughly reviewed. Debridement of mycotic and hypertrophic toenails, 1 through 5 bilateral and clearing of subungual debris. No ulceration, no infection noted.  Return Visit-Office Procedure: Patient instructed to return to the office for a follow up visit 3 months for continued evaluation and treatment.   Gardiner Barefoot DPM

## 2018-06-01 ENCOUNTER — Ambulatory Visit (HOSPITAL_BASED_OUTPATIENT_CLINIC_OR_DEPARTMENT_OTHER)
Admission: RE | Admit: 2018-06-01 | Discharge: 2018-06-01 | Disposition: A | Discharge status: Home / Self Care | Payer: Medicare HMO | Source: Ambulatory Visit | Attending: Internal Medicine | Admitting: Internal Medicine

## 2018-06-01 DIAGNOSIS — Z1231 Encounter for screening mammogram for malignant neoplasm of breast: Secondary | ICD-10-CM

## 2018-06-13 ENCOUNTER — Ambulatory Visit (INDEPENDENT_AMBULATORY_CARE_PROVIDER_SITE_OTHER): Payer: Medicare HMO

## 2018-06-13 DIAGNOSIS — M818 Other osteoporosis without current pathological fracture: Secondary | ICD-10-CM | POA: Diagnosis not present

## 2018-06-13 MED ORDER — DENOSUMAB 60 MG/ML ~~LOC~~ SOSY
60.0000 mg | PREFILLED_SYRINGE | Freq: Once | SUBCUTANEOUS | Status: AC
  Administered 2018-06-13: 60 mg via SUBCUTANEOUS

## 2018-06-30 ENCOUNTER — Other Ambulatory Visit: Payer: Self-pay | Admitting: Internal Medicine

## 2018-07-31 ENCOUNTER — Other Ambulatory Visit: Payer: Self-pay | Admitting: Internal Medicine

## 2018-08-18 ENCOUNTER — Ambulatory Visit: Payer: Medicare HMO | Admitting: Podiatry

## 2018-08-18 ENCOUNTER — Encounter: Payer: Self-pay | Admitting: Podiatry

## 2018-08-18 DIAGNOSIS — M79676 Pain in unspecified toe(s): Secondary | ICD-10-CM | POA: Diagnosis not present

## 2018-08-18 DIAGNOSIS — Q828 Other specified congenital malformations of skin: Secondary | ICD-10-CM | POA: Diagnosis not present

## 2018-08-18 DIAGNOSIS — B351 Tinea unguium: Secondary | ICD-10-CM | POA: Diagnosis not present

## 2018-08-18 NOTE — Progress Notes (Signed)
Patient ID: Ariel Medina, female   DOB: [birth year], 85 y.o.   MRN: [id number] Complaint:  Visit Type: Patient returns to my office for continued preventative foot care services. Complaint: Patient states" my nails have grown long and thick and become painful to walk and wear shoes" . She presents for preventative foot care services. No changes to ROS.  She has painful calluses on the bottom of both feet.  Podiatric Exam: Vascular: dorsalis pedis and posterior tibial pulses are palpable bilateral. Capillary return is immediate. Temperature gradient is WNL. Skin turgor WNL  Sensorium: Normal Semmes Weinstein monofilament test. Normal tactile sensation bilaterally. Nail Exam: Pt has thick disfigured discolored nails with subungual debris noted left foot entire nail hallux through fifth toenails and hallux right foot. Ulcer Exam: There is no evidence of ulcer or pre-ulcerative changes or infection. Orthopedic Exam: Muscle tone and strength are WNL. No limitations in general ROM. No crepitus or effusions noted. Foot type and digits show no abnormalities. Bony prominences are unremarkable. Skin:  Porokeratosis sub fifth mets B/L.Marland Kitchen No infection or ulcers  Diagnosis:  Tinea unguium, Pain in right toe, pain in left toes,  Porokeratosis right foot sub 5th met.  Treatment & Plan Procedures and Treatment: Consent by patient was obtained for treatment procedures. The patient understood the discussion of treatment and procedures well. All questions were answered thoroughly reviewed. Debridement of mycotic and hypertrophic toenails, 1 through 5 bilateral and clearing of subungual debris. No ulceration, no infection noted. Debride porokeratosis. Return Visit-Office Procedure: Patient instructed to return to the office for a follow up visit 3 months for continued evaluation and treatment.   Gardiner Barefoot DPM

## 2018-11-17 ENCOUNTER — Ambulatory Visit: Payer: Medicare HMO | Admitting: Podiatry

## 2018-11-18 ENCOUNTER — Other Ambulatory Visit: Payer: Self-pay | Admitting: Internal Medicine

## 2018-11-24 ENCOUNTER — Ambulatory Visit: Payer: Medicare HMO | Admitting: Internal Medicine

## 2018-11-29 ENCOUNTER — Ambulatory Visit: Payer: Medicare HMO | Admitting: Podiatry

## 2018-11-29 ENCOUNTER — Other Ambulatory Visit: Payer: Self-pay

## 2018-11-29 ENCOUNTER — Encounter: Payer: Self-pay | Admitting: Podiatry

## 2018-11-29 DIAGNOSIS — B351 Tinea unguium: Secondary | ICD-10-CM | POA: Diagnosis not present

## 2018-11-29 DIAGNOSIS — M79676 Pain in unspecified toe(s): Secondary | ICD-10-CM | POA: Diagnosis not present

## 2018-11-29 DIAGNOSIS — Q828 Other specified congenital malformations of skin: Secondary | ICD-10-CM

## 2018-11-29 NOTE — Progress Notes (Signed)
Patient ID: Ariel Medina, female   DOB: [birth year], 85 y.o.   MRN: [id number] Complaint:  Visit Type: Patient returns to my office for continued preventative foot care services. Complaint: Patient states" my nails have grown long and thick and become painful to walk and wear shoes" . She presents for preventative foot care services. No changes to ROS.  She has painful calluses on the bottom of both feet.  Podiatric Exam: Vascular: dorsalis pedis and posterior tibial pulses are palpable bilateral. Capillary return is immediate. Temperature gradient is WNL. Skin turgor WNL  Sensorium: Normal Semmes Weinstein monofilament test. Normal tactile sensation bilaterally. Nail Exam: Pt has thick disfigured discolored nails with subungual debris noted left foot entire nail hallux through fifth toenails and hallux right foot. Ulcer Exam: There is no evidence of ulcer or pre-ulcerative changes or infection. Orthopedic Exam: Muscle tone and strength are WNL. No limitations in general ROM. No crepitus or effusions noted. Foot type and digits show no abnormalities. HAV  B/L Skin:  Porokeratosis sub fifth mets B/L.Marland Kitchen No infection or ulcers  Diagnosis:  Tinea unguium, Pain in right toe, pain in left toes,  Porokeratosis right foot sub 5th met.  Treatment & Plan Procedures and Treatment: Consent by patient was obtained for treatment procedures. The patient understood the discussion of treatment and procedures well. All questions were answered thoroughly reviewed. Debridement of mycotic and hypertrophic toenails, 1 through 5 bilateral and clearing of subungual debris. No ulceration, no infection noted. Debride porokeratosis. Return Visit-Office Procedure: Patient instructed to return to the office for a follow up visit 3 months for continued evaluation and treatment.   Gardiner Barefoot DPM

## 2018-12-15 ENCOUNTER — Other Ambulatory Visit: Payer: Self-pay | Admitting: Internal Medicine

## 2018-12-25 ENCOUNTER — Other Ambulatory Visit: Payer: Self-pay

## 2018-12-25 ENCOUNTER — Encounter: Payer: Self-pay | Admitting: Internal Medicine

## 2018-12-25 ENCOUNTER — Ambulatory Visit (INDEPENDENT_AMBULATORY_CARE_PROVIDER_SITE_OTHER): Payer: Medicare HMO | Admitting: Internal Medicine

## 2018-12-25 ENCOUNTER — Telehealth: Payer: Self-pay

## 2018-12-25 ENCOUNTER — Telehealth: Payer: Self-pay | Admitting: Internal Medicine

## 2018-12-25 DIAGNOSIS — E039 Hypothyroidism, unspecified: Secondary | ICD-10-CM | POA: Diagnosis not present

## 2018-12-25 DIAGNOSIS — I10 Essential (primary) hypertension: Secondary | ICD-10-CM | POA: Diagnosis not present

## 2018-12-25 DIAGNOSIS — E785 Hyperlipidemia, unspecified: Secondary | ICD-10-CM

## 2018-12-25 NOTE — Progress Notes (Signed)
Subjective:    Patient ID: Ariel Medina, female    DOB: 09/10/1927, 85 y.o.   MRN: [id number]  DOS:  12/25/2018 Type of visit - description: Attempted  to make this a video visit, due to technical difficulties from the patient side it was not possible  thus we proceeded with a Virtual Visit via Telephone    I connected with@ on 12/25/18 at  9:00 AM EDT by telephone and verified that I am speaking with the correct person using two identifiers.  THIS ENCOUNTER IS A VIRTUAL VISIT DUE TO COVID-19 - PATIENT WAS NOT SEEN IN THE OFFICE. PATIENT HAS CONSENTED TO VIRTUAL VISIT / TELEMEDICINE VISIT   Location of patient: home  Location of provider: office  I discussed the limitations, risks, security and privacy concerns of performing an evaluation and management service by telephone and the availability of in person appointments. I also discussed with the patient that there may be a patient responsible charge related to this service. The patient expressed understanding and agreed to proceed.     History of Present Illness: ROV COVID-19: Doing well, she lives by herself most of the time, still drives and goes grocery shopping using a mask HTN: No ambulatory BPs Hypothyroidism: On Synthroid, due for labs Osteoporosis: Due for Prolia    BP Readings from Last 3 Encounters:  05/25/18 126/62  11/23/17 130/79  08/12/17 128/82     Review of Systems No fever chills No major problems with cough or difficulty breathing.  She feels at baseline.  Past Medical History:  Diagnosis Date  . Breast CA ([place])    left breast-invasive ductal ca stage 1-stopped arimidex 2008  . Diverticulitis of colon   . Hiatal hernia   . Hyperlipidemia   . Hypertension   . Hypothyroidism   . Osteoarthritis   . Osteopenia    DEXA 7/02  . Syncope 2005   CT head (-), ECHO essent. neg, stress test (-), carotid u/s (-)    Past Surgical History:  Procedure Laterality Date  . BREAST BIOPSY Bilateral   . BREAST  LUMPECTOMY  04/2002   left  . TONSILLECTOMY AND ADENOIDECTOMY      Social History   Socioeconomic History  . Marital status: Widowed    Spouse name: Not on file  . Number of children: 2  . Years of education: Not on file  . Highest education level: Not on file  Occupational History  . Occupation: n/a    Employer: RETIRED  Social Needs  . Financial resource strain: Not on file  . Food insecurity:    Worry: Not on file    Inability: Not on file  . Transportation needs:    Medical: Not on file    Non-medical: Not on file  Tobacco Use  . Smoking status: Never Smoker  . Smokeless tobacco: Never Used  Substance and Sexual Activity  . Alcohol use: No  . Drug use: No  . Sexual activity: Not Currently  Lifestyle  . Physical activity:    Days per week: Not on file    Minutes per session: Not on file  . Stress: Not on file  Relationships  . Social connections:    Talks on phone: Not on file    Gets together: Not on file    Attends religious service: Not on file    Active member of club or organization: Not on file    Attends meetings of clubs or organizations: Not on file  Relationship status: Not on file  . Intimate partner violence:    Fear of current or ex partner: Not on file    Emotionally abused: Not on file    Physically abused: Not on file    Forced sexual activity: Not on file  Other Topics Concern  . Not on file  Social History Narrative   Lost a son 20   G-son ans son live w/ her from time to time, not permanently(Walter Owens Shark is her son and used to be my pt,  h/o etoh)   Sister live next door , does help the pt    G-son @ [employer] , G-daughter @ [employer]    Widow since 1993   still drives       Allergies as of 12/25/2018      Reactions   Ibuprofen    REACTION: bleed      Medication List       Accurate as of Dec 25, 2018  4:19 PM. If you have any questions, ask your nurse or doctor.        acetaminophen 500 MG tablet Commonly known as:   TYLENOL Take 500 mg by mouth every 6 (six) hours as needed for mild pain, moderate pain or headache.   aspirin 81 MG tablet Take 81 mg by mouth every morning.   Caltrate 600+D 600-400 MG-UNIT tablet Generic drug:  Calcium Carbonate-Vitamin D Take 1 tablet by mouth daily.   cyclobenzaprine 5 MG tablet Commonly known as:  FLEXERIL Take 1 tablet (5 mg total) by mouth 2 (two) times daily as needed for muscle spasms.   fluticasone 50 MCG/ACT nasal spray Commonly known as:  FLONASE Place 2 sprays into both nostrils daily as needed for allergies or rhinitis.   hydrochlorothiazide 25 MG tablet Commonly known as:  HYDRODIURIL Take 1 tablet (25 mg total) by mouth daily.   ICAPS AREDS 2 PO Take 1 capsule by mouth 2 (two) times daily.   levothyroxine 75 MCG tablet Commonly known as:  SYNTHROID Take 1 tablet (75 mcg total) by mouth daily before breakfast.   polyethylene glycol 17 g packet Commonly known as:  MIRALAX / GLYCOLAX Take 17 g by mouth daily as needed for moderate constipation.   potassium chloride 10 MEQ tablet Commonly known as:  K-DUR Take 1 tablet (10 mEq total) by mouth daily.   rosuvastatin 5 MG tablet Commonly known as:  CRESTOR Take 1 tablet (5 mg total) by mouth daily.   UNABLE TO FIND Take by mouth.           Objective:   Physical Exam There were no vitals taken for this visit. This was a virtual phone visit.  Alert oriented x3, no apparent distress, she seems to be doing well.    Assessment     Assessment  HTN Hyperlipidemia Hypothyroidism Osteopenia:  --T score -2.1 (04-2012) DC Fosamax 12/2013 after 5 years --T score -1.5   (02-2015)  --T score -3.6 (april 2019) on Prolia DJD GERD  Tremors, likely essential H/o diverticulitis Breast cancer, L breast, release from oncology 2013 Syncope 2005 11-2016: Fall, FX L pubic ramus, admitted then d/c to a NH, back home 12-18-16 Edema, R>L U/S (-)  for DVT 01/2017  PLAN: HTN: No ambulatory BPs, BPs at  the office usually okay, continue HCTZ and potassium.  Check a BMP Hyperlipidemia: On Crestor, last FLP and LFTs satisfactory. Hypothyroidism: On Synthroid, check a TSH COVID-19: Patient taking good precautions. Praised. Osteoporosis: On calcium, vitamin D, due  for Prolia this week.  Okay to proceed when Prolia is available Plan:  RTC for labs   this week RTC CPX 6 months    I discussed the assessment and treatment plan with the patient. The patient was provided an opportunity to ask questions and all were answered. The patient agreed with the plan and demonstrated an understanding of the instructions.   The patient was advised to call back or seek an in-person evaluation if the symptoms worsen or if the condition fails to improve as anticipated.  I provided 15 minutes of non-face-to-face time during this encounter.  Kathlene November, MD

## 2018-12-25 NOTE — Telephone Encounter (Signed)
-----   Message from [place], [place] sent at 12/25/2018  9:17 AM EDT ----- Regarding: FW: labs and schedule Do you know anything about her Prolia shot?  ----- Message ----- From: Colon Branch, MD Sent: 12/25/2018   9:14 AM EDT To: Colon Branch, MD, Damita Dunnings, CMA, # Subject: labs and schedule                              Kaylyn enter orders for: BMP: HTNTSH: Hypothyroidism Also, she tells me that has an appointment Wednesday for a Prolia shot.  Do we have a Prolia for her?  SherriPlease  schedule labs for Wednesday even if she does not have Prolia shot available

## 2018-12-25 NOTE — Telephone Encounter (Signed)
Patients insurance requires prior authorization for prolia. Have not gotten response from insurance company.Once determination has been made I will call the patient to schedule.

## 2018-12-25 NOTE — Assessment & Plan Note (Signed)
HTN: No ambulatory BPs, BPs at the office usually okay, continue HCTZ and potassium.  Check a BMP Hyperlipidemia: On Crestor, last FLP and LFTs satisfactory. Hypothyroidism: On Synthroid, check a TSH COVID-19: Patient taking good precautions. Praised. Osteoporosis: On calcium, vitamin D, due for Prolia this week.  Okay to proceed when Prolia is available Plan:  RTC for labs   this week RTC CPX 6 months

## 2018-12-25 NOTE — Telephone Encounter (Addendum)
thx, will proceed with labs

## 2018-12-25 NOTE — Telephone Encounter (Signed)
Return in about 6 months (around 06/27/2019) for physical exam. AND labs this week...   Called patient and left a message in home answering machine to call back.

## 2018-12-28 ENCOUNTER — Telehealth: Payer: Self-pay | Admitting: *Deleted

## 2018-12-28 NOTE — Telephone Encounter (Signed)
Okay, agree.

## 2018-12-28 NOTE — Telephone Encounter (Signed)
Called pt to pre-screen for lab appt tomorrow. Pt also had nurse visit tomorrow. Pt reports cough x 2-3 days and has been taking mucinex. Pt was sob while on phone call but says sob is not new. She denies any other symptoms. Advised pt she needs Virtual Visit with PCP first and he can determine guidelines for when it would be safe for pt to come in the office for nurse and lab appt. Doxy visit scheduled for tomorrow at 10am and pt states her grandson will help her with this but he will need to call us tomorrow to provide email / smartphone # to send link for the virtual visit.

## 2018-12-29 ENCOUNTER — Encounter: Payer: Self-pay | Admitting: Internal Medicine

## 2018-12-29 ENCOUNTER — Ambulatory Visit (INDEPENDENT_AMBULATORY_CARE_PROVIDER_SITE_OTHER): Payer: Medicare HMO | Admitting: Internal Medicine

## 2018-12-29 ENCOUNTER — Ambulatory Visit: Payer: Medicare HMO

## 2018-12-29 ENCOUNTER — Other Ambulatory Visit: Payer: Medicare HMO

## 2018-12-29 ENCOUNTER — Other Ambulatory Visit: Payer: Self-pay

## 2018-12-29 DIAGNOSIS — R05 Cough: Secondary | ICD-10-CM

## 2018-12-29 DIAGNOSIS — R059 Cough, unspecified: Secondary | ICD-10-CM

## 2018-12-29 NOTE — Progress Notes (Signed)
Subjective:    Patient ID: Ariel Medina, female    DOB: September 10, 1927, 85 y.o.   MRN: [id number]  DOS:  12/29/2018 Type of visit - description: Virtual Visit via Video Note  I connected with@ on 12/29/18 at 10:00 AM EDT by a video enabled telemedicine application and verified that I am speaking with the correct person using two identifiers.   THIS ENCOUNTER IS A VIRTUAL VISIT DUE TO COVID-19 - PATIENT WAS NOT SEEN IN THE OFFICE. PATIENT HAS CONSENTED TO VIRTUAL VISIT / TELEMEDICINE VISIT   Location of patient: home  Location of provider: office  I discussed the limitations of evaluation and management by telemedicine and the availability of in person appointments. The patient expressed understanding and agreed to proceed.  History of Present Illness: Acute visit The patient was supposed to have blood work but she failed the Covid 19 clinical screen before labs, reason why I am calling her. She reports she is short of breath for a long time but has developed a cough in the last 2 to 3 days.  The cough is mild, produces very little sputum sometimes ; does not know what color the sputum. Today she said she is "a lot better" since she started taking Mucinex. I ask about DOE and she is able to do her routine activities without noticing increased DOE. She lives by herself but her grandsons lives with her on and off. The patient herself has not go out of the house in more than a week.     Review of Systems No fever chills No sore throat No sinus pain or congestion No chest pain, no edema. No nausea, vomiting, diarrhea.  No dizziness  Past Medical History:  Diagnosis Date  . Breast CA ([place])    left breast-invasive ductal ca stage 1-stopped arimidex 2008  . Diverticulitis of colon   . Hiatal hernia   . Hyperlipidemia   . Hypertension   . Hypothyroidism   . Osteoarthritis   . Osteopenia    DEXA 7/02  . Syncope 2005   CT head (-), ECHO essent. neg, stress test (-), carotid u/s  (-)    Past Surgical History:  Procedure Laterality Date  . BREAST BIOPSY Bilateral   . BREAST LUMPECTOMY  04/2002   left  . TONSILLECTOMY AND ADENOIDECTOMY      Social History   Socioeconomic History  . Marital status: Widowed    Spouse name: Not on file  . Number of children: 2  . Years of education: Not on file  . Highest education level: Not on file  Occupational History  . Occupation: n/a    Employer: RETIRED  Social Needs  . Financial resource strain: Not on file  . Food insecurity:    Worry: Not on file    Inability: Not on file  . Transportation needs:    Medical: Not on file    Non-medical: Not on file  Tobacco Use  . Smoking status: Never Smoker  . Smokeless tobacco: Never Used  Substance and Sexual Activity  . Alcohol use: No  . Drug use: No  . Sexual activity: Not Currently  Lifestyle  . Physical activity:    Days per week: Not on file    Minutes per session: Not on file  . Stress: Not on file  Relationships  . Social connections:    Talks on phone: Not on file    Gets together: Not on file    Attends religious service: Not on file  Active member of club or organization: Not on file    Attends meetings of clubs or organizations: Not on file    Relationship status: Not on file  . Intimate partner violence:    Fear of current or ex partner: Not on file    Emotionally abused: Not on file    Physically abused: Not on file    Forced sexual activity: Not on file  Other Topics Concern  . Not on file  Social History Narrative   Lost a son 75   G-son ans son live w/ her from time to time, not permanently(Walter Owens Shark is her son and used to be my pt,  h/o etoh)   Sister live next door , does help the pt    G-son @ [employer] , G-daughter @ [employer]    Widow since 1993   still drives       Allergies as of 12/29/2018      Reactions   Ibuprofen    REACTION: bleed      Medication List       Accurate as of Dec 29, 2018 10:03 AM. If you have  any questions, ask your nurse or doctor.        acetaminophen 500 MG tablet Commonly known as:  TYLENOL Take 500 mg by mouth every 6 (six) hours as needed for mild pain, moderate pain or headache.   aspirin 81 MG tablet Take 81 mg by mouth every morning.   Caltrate 600+D 600-400 MG-UNIT tablet Generic drug:  Calcium Carbonate-Vitamin D Take 1 tablet by mouth daily.   cyclobenzaprine 5 MG tablet Commonly known as:  FLEXERIL Take 1 tablet (5 mg total) by mouth 2 (two) times daily as needed for muscle spasms.   fluticasone 50 MCG/ACT nasal spray Commonly known as:  FLONASE Place 2 sprays into both nostrils daily as needed for allergies or rhinitis.   hydrochlorothiazide 25 MG tablet Commonly known as:  HYDRODIURIL Take 1 tablet (25 mg total) by mouth daily.   ICAPS AREDS 2 PO Take 1 capsule by mouth 2 (two) times daily.   levothyroxine 75 MCG tablet Commonly known as:  SYNTHROID Take 1 tablet (75 mcg total) by mouth daily before breakfast.   polyethylene glycol 17 g packet Commonly known as:  MIRALAX / GLYCOLAX Take 17 g by mouth daily as needed for moderate constipation.   potassium chloride 10 MEQ tablet Commonly known as:  K-DUR Take 1 tablet (10 mEq total) by mouth daily.   rosuvastatin 5 MG tablet Commonly known as:  CRESTOR Take 1 tablet (5 mg total) by mouth daily.   UNABLE TO FIND Take by mouth.           Objective:   Physical Exam There were no vitals taken for this visit. This is a virtual phone visit, patient is alert oriented x3, mental status is normal.  She sounds like she is somewhat short of breath but I know the patient for years and that is her baseline.  No cough noted.    Assessment     Assessment  HTN Hyperlipidemia Hypothyroidism Osteopenia:  --T score -2.1 (04-2012) DC Fosamax 12/2013 after 5 years --T score -1.5   (02-2015)  --T score -3.6 (april 2019) on Prolia DJD GERD  Tremors, likely essential H/o diverticulitis Breast  cancer, L breast, release from oncology 2013 Syncope 2005 11-2016: Fall, FX L pubic ramus, admitted then d/c to a NH, back home 12-18-16 Edema, R>L U/S (-)  for DVT 01/2017  PLAN: Cough: As described above, for few days, better today.  No fevers, SOB essentially at baseline. She shelters at home but her grandson stays at home on and off. No evidence of severe dyspnea or hypoperfusion. Plan: Provided  phone numbers for local Covid testing sites and encouraged to call them Close observation in the next few days, check her temperature, if fever, chills, increase difficulty breathing, chest pain, or other worrisome symptoms: Go to the ER. We will check on her next week. Proceed with labs if she is doing better.   I discussed the assessment and treatment plan with the patient. The patient was provided an opportunity to ask questions and all were answered. The patient agreed with the plan and demonstrated an understanding of the instructions.   The patient was advised to call back or seek an in-person evaluation if the symptoms worsen or if the condition fails to improve as anticipated.   I provided 18 minutes of non-face-to-face time during this encounter.  Kathlene November, MD

## 2019-01-01 NOTE — Assessment & Plan Note (Signed)
Cough: As described above, for few days, better today.  No fevers, SOB essentially at baseline. She shelters at home but her grandson stays at home on and off. No evidence of severe dyspnea or hypoperfusion. Plan: Provided  phone numbers for local Covid testing sites and encouraged to call them Close observation in the next few days, check her temperature, if fever, chills, increase difficulty breathing, chest pain, or other worrisome symptoms: Go to the ER. We will check on her next week. Proceed with labs if she is doing better.

## 2019-01-02 ENCOUNTER — Other Ambulatory Visit: Payer: Medicare HMO

## 2019-01-02 ENCOUNTER — Telehealth: Payer: Self-pay

## 2019-01-02 DIAGNOSIS — Z20822 Contact with and (suspected) exposure to covid-19: Secondary | ICD-10-CM

## 2019-01-02 DIAGNOSIS — R6889 Other general symptoms and signs: Secondary | ICD-10-CM | POA: Diagnosis not present

## 2019-01-02 NOTE — Telephone Encounter (Signed)
Dr. [name] requests COVID 19 testing.

## 2019-01-03 LAB — NOVEL CORONAVIRUS, NAA: SARS-CoV-2, NAA: NOT DETECTED

## 2019-01-03 NOTE — Telephone Encounter (Signed)
Pt. Scheduled for COVID 19 testing yesterday. Dr. [name] had inquired if pt. Had been tested.

## 2019-01-03 NOTE — Telephone Encounter (Signed)
FYI

## 2019-01-03 NOTE — Telephone Encounter (Signed)
Please advise, wrong office not our pt

## 2019-01-04 NOTE — Telephone Encounter (Signed)
Spoke w/ Pt- informed of negative covid-19 test. Pt verbalized understanding.

## 2019-01-04 NOTE — Telephone Encounter (Signed)
Advise patient, coronavirus testing negative.  Good results

## 2019-01-23 ENCOUNTER — Ambulatory Visit: Payer: Medicare HMO

## 2019-01-26 ENCOUNTER — Ambulatory Visit (INDEPENDENT_AMBULATORY_CARE_PROVIDER_SITE_OTHER): Payer: Medicare HMO

## 2019-01-26 ENCOUNTER — Other Ambulatory Visit: Payer: Self-pay

## 2019-01-26 ENCOUNTER — Other Ambulatory Visit (INDEPENDENT_AMBULATORY_CARE_PROVIDER_SITE_OTHER): Payer: Medicare HMO

## 2019-01-26 DIAGNOSIS — E039 Hypothyroidism, unspecified: Secondary | ICD-10-CM

## 2019-01-26 DIAGNOSIS — I10 Essential (primary) hypertension: Secondary | ICD-10-CM

## 2019-01-26 DIAGNOSIS — M810 Age-related osteoporosis without current pathological fracture: Secondary | ICD-10-CM | POA: Diagnosis not present

## 2019-01-26 DIAGNOSIS — I159 Secondary hypertension, unspecified: Secondary | ICD-10-CM

## 2019-01-26 LAB — BASIC METABOLIC PANEL
BUN: 10 mg/dL (ref 6–23)
CO2: 31 mEq/L (ref 19–32)
Calcium: 9.5 mg/dL (ref 8.4–10.5)
Chloride: 97 mEq/L (ref 96–112)
Creatinine, Ser: 0.6 mg/dL (ref 0.40–1.20)
GFR: 94.17 mL/min (ref >60.00)
Glucose, Bld: 77 mg/dL (ref 70–99)
Potassium: 3.7 mEq/L (ref 3.5–5.1)
Sodium: 137 mEq/L (ref 135–145)

## 2019-01-26 LAB — TSH: TSH: 2.19 u[IU]/mL (ref 0.35–4.50)

## 2019-01-26 MED ORDER — DENOSUMAB 60 MG/ML ~~LOC~~ SOSY
60.0000 mg | PREFILLED_SYRINGE | Freq: Once | SUBCUTANEOUS | Status: AC
  Administered 2019-01-26: 60 mg via SUBCUTANEOUS

## 2019-01-26 NOTE — Progress Notes (Addendum)
Patient in today to have her Prolia Injection per Dr. [name] She tolerated 60mg /ml Subq in her right arm with no complications.   Kathlene November, MD

## 2019-02-28 ENCOUNTER — Ambulatory Visit: Payer: Medicare HMO | Admitting: Podiatry

## 2019-03-08 DIAGNOSIS — H524 Presbyopia: Secondary | ICD-10-CM | POA: Diagnosis not present

## 2019-03-08 DIAGNOSIS — H5213 Myopia, bilateral: Secondary | ICD-10-CM | POA: Diagnosis not present

## 2019-03-08 DIAGNOSIS — H52209 Unspecified astigmatism, unspecified eye: Secondary | ICD-10-CM | POA: Diagnosis not present

## 2019-03-16 ENCOUNTER — Other Ambulatory Visit: Payer: Self-pay

## 2019-03-16 ENCOUNTER — Encounter: Payer: Self-pay | Admitting: Podiatry

## 2019-03-16 ENCOUNTER — Ambulatory Visit: Payer: Medicare HMO | Admitting: Podiatry

## 2019-03-16 VITALS — Temp 97.3°F

## 2019-03-16 DIAGNOSIS — Q828 Other specified congenital malformations of skin: Secondary | ICD-10-CM

## 2019-03-16 DIAGNOSIS — M79676 Pain in unspecified toe(s): Secondary | ICD-10-CM

## 2019-03-16 DIAGNOSIS — B351 Tinea unguium: Secondary | ICD-10-CM

## 2019-03-16 NOTE — Progress Notes (Signed)
Patient ID: Ariel Medina, female   DOB: [birth year], 85 y.o.   MRN: [id number] Complaint:  Visit Type: Patient returns to my office for continued preventative foot care services. Complaint: Patient states" my nails have grown long and thick and become painful to walk and wear shoes" . She presents for preventative foot care services. No changes to ROS.  She has painful calluses on the bottom of both feet.  Podiatric Exam: Vascular: dorsalis pedis and posterior tibial pulses are palpable bilateral. Capillary return is immediate. Temperature gradient is WNL. Skin turgor WNL  Sensorium: Normal Semmes Weinstein monofilament test. Normal tactile sensation bilaterally. Nail Exam: Pt has thick disfigured discolored nails with subungual debris noted left foot entire nail hallux through fifth toenails and hallux right foot. Ulcer Exam: There is no evidence of ulcer or pre-ulcerative changes or infection. Orthopedic Exam: Muscle tone and strength are WNL. No limitations in general ROM. No crepitus or effusions noted. Foot type and digits show no abnormalities. HAV  B/L Skin:  Porokeratosis sub fifth mets B/L.Marland Kitchen No infection or ulcers  Diagnosis:  Tinea unguium, Pain in right toe, pain in left toes,  Porokeratosis right foot sub 5th met.  Treatment & Plan Procedures and Treatment: Consent by patient was obtained for treatment procedures. The patient understood the discussion of treatment and procedures well. All questions were answered thoroughly reviewed. Debridement of mycotic and hypertrophic toenails, 1 through 5 bilateral and clearing of subungual debris. No ulceration, no infection noted. Debride porokeratosis. Return Visit-Office Procedure: Patient instructed to return to the office for a follow up visit 3 months for continued evaluation and treatment.   Gardiner Barefoot DPM

## 2019-05-04 ENCOUNTER — Other Ambulatory Visit: Payer: Self-pay | Admitting: Internal Medicine

## 2019-05-10 ENCOUNTER — Other Ambulatory Visit: Payer: Self-pay | Admitting: Internal Medicine

## 2019-06-11 ENCOUNTER — Other Ambulatory Visit (HOSPITAL_BASED_OUTPATIENT_CLINIC_OR_DEPARTMENT_OTHER): Payer: Self-pay | Admitting: Internal Medicine

## 2019-06-11 DIAGNOSIS — Z1231 Encounter for screening mammogram for malignant neoplasm of breast: Secondary | ICD-10-CM

## 2019-06-20 ENCOUNTER — Ambulatory Visit (INDEPENDENT_AMBULATORY_CARE_PROVIDER_SITE_OTHER): Payer: Medicare HMO | Admitting: Podiatry

## 2019-06-20 ENCOUNTER — Encounter: Payer: Self-pay | Admitting: Podiatry

## 2019-06-20 ENCOUNTER — Other Ambulatory Visit: Payer: Self-pay

## 2019-06-20 DIAGNOSIS — Q828 Other specified congenital malformations of skin: Secondary | ICD-10-CM | POA: Diagnosis not present

## 2019-06-20 DIAGNOSIS — B351 Tinea unguium: Secondary | ICD-10-CM

## 2019-06-20 DIAGNOSIS — M79676 Pain in unspecified toe(s): Secondary | ICD-10-CM

## 2019-06-20 NOTE — Progress Notes (Signed)
Patient ID: Ariel Medina, female   DOB: [birth year], 85 y.o.   MRN: [id number] Complaint:  Visit Type: Patient returns to my office for continued preventative foot care services. Complaint: Patient states" my nails have grown long and thick and become painful to walk and wear shoes" . She presents for preventative foot care services. No changes to ROS.  She has painful calluses on the bottom of both feet.  Podiatric Exam: Vascular: dorsalis pedis and posterior tibial pulses are palpable bilateral. Capillary return is immediate. Temperature gradient is WNL. Skin turgor WNL  Sensorium: Normal Semmes Weinstein monofilament test. Normal tactile sensation bilaterally. Nail Exam: Pt has thick disfigured discolored nails with subungual debris noted left foot entire nail hallux through fifth toenails and hallux right foot. Ulcer Exam: There is no evidence of ulcer or pre-ulcerative changes or infection. Orthopedic Exam: Muscle tone and strength are WNL. No limitations in general ROM. No crepitus or effusions noted. Foot type and digits show no abnormalities. HAV  B/L Skin:  Porokeratosis sub fifth mets right.. No infection or ulcers  Diagnosis:  Tinea unguium, Pain in right toe, pain in left toes,  Porokeratosis right foot sub 5th met.  Treatment & Plan Procedures and Treatment: Consent by patient was obtained for treatment procedures. The patient understood the discussion of treatment and procedures well. All questions were answered thoroughly reviewed. Debridement of mycotic and hypertrophic toenails, 1 through 5 bilateral and clearing of subungual debris. No ulceration, no infection noted. Debride porokeratosis. Return Visit-Office Procedure: Patient instructed to return to the office for a follow up visit  10 weeks  for continued evaluation and treatment.   Gardiner Barefoot DPM

## 2019-06-26 ENCOUNTER — Other Ambulatory Visit: Payer: Self-pay

## 2019-06-26 ENCOUNTER — Ambulatory Visit (HOSPITAL_BASED_OUTPATIENT_CLINIC_OR_DEPARTMENT_OTHER)
Admission: RE | Admit: 2019-06-26 | Discharge: 2019-06-26 | Disposition: A | Discharge status: Home / Self Care | Payer: Medicare HMO | Source: Ambulatory Visit | Attending: Internal Medicine | Admitting: Internal Medicine

## 2019-06-26 ENCOUNTER — Ambulatory Visit (INDEPENDENT_AMBULATORY_CARE_PROVIDER_SITE_OTHER): Payer: Medicare HMO | Admitting: Internal Medicine

## 2019-06-26 ENCOUNTER — Encounter: Payer: Self-pay | Admitting: Internal Medicine

## 2019-06-26 VITALS — BP 154/96 | HR 76 | Temp 95.6°F | Resp 18 | Ht 62.0 in | Wt 126.5 lb

## 2019-06-26 DIAGNOSIS — E039 Hypothyroidism, unspecified: Secondary | ICD-10-CM

## 2019-06-26 DIAGNOSIS — E785 Hyperlipidemia, unspecified: Secondary | ICD-10-CM | POA: Diagnosis not present

## 2019-06-26 DIAGNOSIS — I10 Essential (primary) hypertension: Secondary | ICD-10-CM

## 2019-06-26 DIAGNOSIS — Z0000 Encounter for general adult medical examination without abnormal findings: Secondary | ICD-10-CM

## 2019-06-26 DIAGNOSIS — Z1231 Encounter for screening mammogram for malignant neoplasm of breast: Secondary | ICD-10-CM

## 2019-06-26 LAB — CBC WITH DIFFERENTIAL/PLATELET
Basophils Absolute: 0 10*3/uL (ref 0.0–0.1)
Basophils Relative: 0.7 % (ref 0.0–3.0)
Eosinophils Absolute: 0.1 10*3/uL (ref 0.0–0.7)
Eosinophils Relative: 0.9 % (ref 0.0–5.0)
HCT: 44.9 % (ref 36.0–46.0)
Hemoglobin: 15.2 g/dL — ABNORMAL HIGH (ref 12.0–15.0)
Lymphocytes Relative: 21.1 % (ref 12.0–46.0)
Lymphs Abs: 1.2 10*3/uL (ref 0.7–4.0)
MCHC: 33.7 g/dL (ref 30.0–36.0)
MCV: 95.3 fl (ref 78.0–100.0)
Monocytes Absolute: 0.6 10*3/uL (ref 0.1–1.0)
Monocytes Relative: 10.3 % (ref 3.0–12.0)
Neutro Abs: 3.8 10*3/uL (ref 1.4–7.7)
Neutrophils Relative %: 67 % (ref 43.0–77.0)
Platelets: 234 10*3/uL (ref 150.0–400.0)
RBC: 4.71 Mil/uL (ref 3.87–5.11)
RDW: 13.9 % (ref 11.5–15.5)
WBC: 5.6 10*3/uL (ref 4.0–10.5)

## 2019-06-26 LAB — LIPID PANEL
Cholesterol: 248 mg/dL — ABNORMAL HIGH (ref 0–200)
HDL: 60.6 mg/dL (ref >39.00)
LDL Cholesterol: 169 mg/dL — ABNORMAL HIGH (ref 0–99)
NonHDL: 187.87
Total CHOL/HDL Ratio: 4
Triglycerides: 96 mg/dL (ref 0.0–149.0)
VLDL: 19.2 mg/dL (ref 0.0–40.0)

## 2019-06-26 LAB — COMPREHENSIVE METABOLIC PANEL
ALT: 7 U/L (ref 0–35)
AST: 16 U/L (ref 0–37)
Albumin: 4 g/dL (ref 3.5–5.2)
Alkaline Phosphatase: 45 U/L (ref 39–117)
BUN: 12 mg/dL (ref 6–23)
CO2: 34 mEq/L — ABNORMAL HIGH (ref 19–32)
Calcium: 9.5 mg/dL (ref 8.4–10.5)
Chloride: 97 mEq/L (ref 96–112)
Creatinine, Ser: 0.66 mg/dL (ref 0.40–1.20)
GFR: 84.29 mL/min (ref >60.00)
Glucose, Bld: 90 mg/dL (ref 70–99)
Potassium: 4.1 mEq/L (ref 3.5–5.1)
Sodium: 137 mEq/L (ref 135–145)
Total Bilirubin: 0.8 mg/dL (ref 0.2–1.2)
Total Protein: 6.2 g/dL (ref 6.0–8.3)

## 2019-06-26 LAB — TSH: TSH: 3.44 u[IU]/mL (ref 0.35–4.50)

## 2019-06-26 NOTE — Progress Notes (Signed)
Subjective:    Patient ID: Ariel Medina, female    DOB: July 15, 1928, 85 y.o.   MRN: [id number]  DOS:  06/26/2019 Type of visit - description: CPX In general doing well, no major concerns.   BP Readings from Last 3 Encounters:  06/26/19 (!) 154/96  05/25/18 126/62  11/23/17 130/79     Review of Systems Admits to occasionally noncompliance with potassium and statins.  Also room for improvement on her low-salt diet  Other than above, a 14 point review of systems is negative    Past Medical History:  Diagnosis Date  . Breast CA ([place])    left breast-invasive ductal ca stage 1-stopped arimidex 2008  . Diverticulitis of colon   . Hiatal hernia   . Hyperlipidemia   . Hypertension   . Hypothyroidism   . Osteoarthritis   . Osteopenia    DEXA 7/02  . Syncope 2005   CT head (-), ECHO essent. neg, stress test (-), carotid u/s (-)    Past Surgical History:  Procedure Laterality Date  . BREAST BIOPSY Bilateral   . BREAST LUMPECTOMY  04/2002   left  . TONSILLECTOMY AND ADENOIDECTOMY      Social History   Socioeconomic History  . Marital status: Widowed    Spouse name: Not on file  . Number of children: 2  . Years of education: Not on file  . Highest education level: Not on file  Occupational History  . Occupation: n/a    Employer: RETIRED  Social Needs  . Financial resource strain: Not on file  . Food insecurity    Worry: Not on file    Inability: Not on file  . Transportation needs    Medical: Not on file    Non-medical: Not on file  Tobacco Use  . Smoking status: Never Smoker  . Smokeless tobacco: Never Used  Substance and Sexual Activity  . Alcohol use: No  . Drug use: No  . Sexual activity: Not Currently  Lifestyle  . Physical activity    Days per week: Not on file    Minutes per session: Not on file  . Stress: Not on file  Relationships  . Social Herbalist on phone: Not on file    Gets together: Not on file    Attends religious  service: Not on file    Active member of club or organization: Not on file    Attends meetings of clubs or organizations: Not on file    Relationship status: Not on file  . Intimate partner violence    Fear of current or ex partner: Not on file    Emotionally abused: Not on file    Physically abused: Not on file    Forced sexual activity: Not on file  Other Topics Concern  . Not on file  Social History Narrative   Lost a son 7   G-son  live w/ her from time to time, not permanently   ([name] is her son and used to be my pt,  h/o etoh)   Sister live next door , does help the pt    G-son @ [employer] , G-daughter @ [employer] since 1993   still drives, short distances, typically not at night.     Family History  Problem Relation Age of Onset  . Heart attack Brother 77  . Diabetes Mother   . Stroke Father 49  . Stroke Sister 20  . Colon  cancer Neg Hx   . Breast cancer Neg Hx      Allergies as of 06/26/2019      Reactions   Ibuprofen    REACTION: bleed      Medication List       Accurate as of June 26, 2019 11:59 PM. If you have any questions, ask your nurse or doctor.        acetaminophen 500 MG tablet Commonly known as: TYLENOL Take 500 mg by mouth every 6 (six) hours as needed for mild pain, moderate pain or headache.   aspirin 81 MG tablet Take 81 mg by mouth every morning.   Caltrate 600+D 600-400 MG-UNIT tablet Generic drug: Calcium Carbonate-Vitamin D Take 1 tablet by mouth daily.   cyclobenzaprine 5 MG tablet Commonly known as: FLEXERIL Take 1 tablet (5 mg total) by mouth 2 (two) times daily as needed for muscle spasms.   fluticasone 50 MCG/ACT nasal spray Commonly known as: FLONASE Place 2 sprays into both nostrils daily as needed for allergies or rhinitis.   hydrochlorothiazide 25 MG tablet Commonly known as: HYDRODIURIL Take 1 tablet (25 mg total) by mouth daily.   ICAPS AREDS 2 PO Take 1 capsule by mouth 2 (two) times  daily.   levothyroxine 75 MCG tablet Commonly known as: SYNTHROID Take 1 tablet (75 mcg total) by mouth daily before breakfast.   polyethylene glycol 17 g packet Commonly known as: MIRALAX / GLYCOLAX Take 17 g by mouth daily as needed for moderate constipation.   potassium chloride 10 MEQ tablet Commonly known as: KLOR-CON Take 1 tablet (10 mEq total) by mouth daily.   rosuvastatin 5 MG tablet Commonly known as: CRESTOR Take 1 tablet (5 mg total) by mouth daily.   UNABLE TO FIND Take by mouth.           Objective:   Physical Exam BP (!) 154/96 (BP Location: Right Arm, Patient Position: Sitting, Cuff Size: Small)   Pulse 76   Temp (!) 95.6 F (35.3 C) (Temporal)   Resp 18   Ht 5\' 2"  (1.575 m)   Wt 126 lb 8 oz (57.4 kg)   SpO2 97%   BMI 23.14 kg/m  General: Well developed, NAD, BMI noted Neck: No  thyromegaly  HEENT:  Normocephalic . Face symmetric, atraumatic Lungs:  CTA B Normal respiratory effort, no intercostal retractions, no accessory muscle use. Heart: RRR,  no murmur.  No pretibial edema bilaterally  Abdomen:  Not distended, soft, non-tender. No rebound or rigidity.   Skin: Exposed areas without rash. Not pale. Not jaundice Neurologic:  alert & oriented X3.  Speech normal, gait assisted by a cane, + shuffle and short steps. + Tremor, noticeable in the head, not on the hands. Psych: Cognition and judgment appear intact.  Cooperative with normal attention span and concentration.  Behavior appropriate. No anxious or depressed appearing.     Assessment     Assessment  HTN Hyperlipidemia Hypothyroidism Osteopenia:  --T score -2.1 (04-2012) DC Fosamax 12/2013 after 5 years --T score -1.5   (02-2015)  --T score -3.6 (april 2019) on Prolia DJD GERD  Tremors, likely essential H/o diverticulitis Breast cancer, L breast, release from oncology 2013 Syncope 2005 11-2016: Fall, FX L pubic ramus, admitted then d/c to a NH, back home 12-18-16 Edema, R>L  U/S (-)  for DVT 01/2017  PLAN: Here for CPX HTN: BP today slightly elevated, typically okay, recommend to continue HCTZ potassium.  Encourage good compliance with K+.  Unable to  do ambulatory BPs; checking labs. Hyperlipidemia: On Crestor, encourage better compliance, often times she skips.  Checking labs Hypothyroidism: On Synthroid, TSH Osteopenia: On Prolia, DEXA next year RTC 8 months

## 2019-06-26 NOTE — Patient Instructions (Addendum)
Please schedule Medicare Wellness with Glenard Haring.   GO TO THE LAB : Get the blood work     GO TO THE FRONT DESK Schedule your next appointment for a checkup in 8 months       Fall Prevention in the Home, Adult Falls can cause injuries and can affect people from all age groups. There are many simple things that you can do to make your home safe and to help prevent falls. Ask for help when making these changes, if needed. What actions can I take to prevent falls? General instructions  Use good lighting in all rooms. Replace any light bulbs that burn out.  Turn on lights if it is dark. Use night-lights.  Place frequently used items in easy-to-reach places. Lower the shelves around your home if necessary.  Set up furniture so that there are clear paths around it. Avoid moving your furniture around.  Remove throw rugs and other tripping hazards from the floor.  Avoid walking on wet floors.  Fix any uneven floor surfaces.  Add color or contrast paint or tape to grab bars and handrails in your home. Place contrasting color strips on the first and last steps of stairways.  When you use a stepladder, make sure that it is completely opened and that the sides are firmly locked. Have someone hold the ladder while you are using it. Do not climb a closed stepladder.  Be aware of any and all pets. What can I do in the bathroom?      Keep the floor dry. Immediately clean up any water that spills onto the floor.  Remove soap buildup in the tub or shower on a regular basis.  Use non-skid mats or decals on the floor of the tub or shower.  Attach bath mats securely with double-sided, non-slip rug tape.  If you need to sit down while you are in the shower, use a plastic, non-slip stool.  Install grab bars by the toilet and in the tub and shower. Do not use towel bars as grab bars. What can I do in the bedroom?  Make sure that a bedside light is easy to reach.  Do not use oversized bedding  that drapes onto the floor.  Have a firm chair that has side arms to use for getting dressed. What can I do in the kitchen?  Clean up any spills right away.  If you need to reach for something above you, use a sturdy step stool that has a grab bar.  Keep electrical cables out of the way.  Do not use floor polish or wax that makes floors slippery. If you must use wax, make sure that it is non-skid floor wax. What can I do in the stairways?  Do not leave any items on the stairs.  Make sure that you have a light switch at the top of the stairs and the bottom of the stairs. Have them installed if you do not have them.  Make sure that there are handrails on both sides of the stairs. Fix handrails that are broken or loose. Make sure that handrails are as long as the stairways.  Install non-slip stair treads on all stairs in your home.  Avoid having throw rugs at the top or bottom of stairways, or secure the rugs with carpet tape to prevent them from moving.  Choose a carpet design that does not hide the edge of steps on the stairway.  Check any carpeting to make sure that it is  firmly attached to the stairs. Fix any carpet that is loose or worn. What can I do on the outside of my home?  Use bright outdoor lighting.  Regularly repair the edges of walkways and driveways and fix any cracks.  Remove high doorway thresholds.  Trim any shrubbery on the main path into your home.  Regularly check that handrails are securely fastened and in good repair. Both sides of any steps should have handrails.  Install guardrails along the edges of any raised decks or porches.  Clear walkways of debris and clutter, including tools and rocks.  Have leaves, snow, and ice cleared regularly.  Use sand or salt on walkways during winter months.  In the garage, clean up any spills right away, including grease or oil spills. What other actions can I take?  Wear closed-toe shoes that fit well and  support your feet. Wear shoes that have rubber soles or low heels.  Use mobility aids as needed, such as canes, walkers, scooters, and crutches.  Review your medicines with your health care provider. Some medicines can cause dizziness or changes in blood pressure, which increase your risk of falling. Talk with your health care provider about other ways that you can decrease your risk of falls. This may include working with a physical therapist or trainer to improve your strength, balance, and endurance. Where to find more information  Centers for Disease Control and Prevention, STEADI: WebmailGuide.co.za  [employer] on Aging: BrainJudge.co.uk Contact a health care provider if:  You are afraid of falling at home.  You feel weak, drowsy, or dizzy at home.  You fall at home. Summary  There are many simple things that you can do to make your home safe and to help prevent falls.  Ways to make your home safe include removing tripping hazards and installing grab bars in the bathroom.  Ask for help when making these changes in your home. This information is not intended to replace advice given to you by your health care provider. Make sure you discuss any questions you have with your health care provider. Document Released: 07/16/2002 Document Revised: 07/08/2017 Document Reviewed: 03/10/2017 Elsevier Patient Education  2020 [employer].

## 2019-06-26 NOTE — Progress Notes (Signed)
Pre visit review using our clinic review tool, if applicable. No additional management support is needed unless otherwise documented below in the visit note. 

## 2019-06-26 NOTE — Progress Notes (Deleted)
HTN: hctz taking, only taking potassium sometimes  Not checking at home No headache visual changes abdominal pain edema Admits to eating salt  HLD: crestor Does not take as she should, has not taken in about a week   Hypothyroidism: synthroid is taking  Osteoporosis: on prolia    Plan: CBC TSH Lipids BMP

## 2019-06-27 NOTE — Assessment & Plan Note (Signed)
-  Td 2019 - pneumonia shot 02-2008, 2019 -prevnar --2015 - shingrix x 1  10/2018, rec proceed w/ second dose  -had a flu shot  - MMG done today  -No  further PAPs  CCS:  No further screening planned  Social: Still living independently, drives short distances.  Has family members nearby. Discussed fall precautions.   Labs: CMP, FLP, CBC, TSH

## 2019-06-27 NOTE — Assessment & Plan Note (Signed)
Here for CPX HTN: BP today slightly elevated, typically okay, recommend to continue HCTZ potassium.  Encourage good compliance with K+.  Unable to do ambulatory BPs; checking labs. Hyperlipidemia: On Crestor, encourage better compliance, often times she skips.  Checking labs Hypothyroidism: On Synthroid, TSH Osteopenia: On Prolia, DEXA next year RTC 8 months

## 2019-07-02 ENCOUNTER — Telehealth: Payer: Self-pay | Admitting: Internal Medicine

## 2019-07-02 NOTE — Telephone Encounter (Signed)
Returned call to patient regarding AWV, no answer. Could not leave message for patient to call office back (no voicemail set up). SF

## 2019-07-02 NOTE — Telephone Encounter (Signed)
Patient is calling back to let Ariel Medina know she can wait until after the year to get her AWV scheduled.

## 2019-08-10 DIAGNOSIS — K561 Intussusception: Secondary | ICD-10-CM

## 2019-08-10 HISTORY — DX: Intussusception: K56.1

## 2019-08-29 ENCOUNTER — Other Ambulatory Visit: Payer: Self-pay

## 2019-08-29 ENCOUNTER — Ambulatory Visit (INDEPENDENT_AMBULATORY_CARE_PROVIDER_SITE_OTHER): Payer: Medicare HMO | Admitting: Podiatry

## 2019-08-29 ENCOUNTER — Encounter: Payer: Self-pay | Admitting: Podiatry

## 2019-08-29 DIAGNOSIS — Q828 Other specified congenital malformations of skin: Secondary | ICD-10-CM

## 2019-08-29 DIAGNOSIS — B351 Tinea unguium: Secondary | ICD-10-CM | POA: Diagnosis not present

## 2019-08-29 DIAGNOSIS — M79676 Pain in unspecified toe(s): Secondary | ICD-10-CM

## 2019-08-29 NOTE — Progress Notes (Signed)
Patient ID: Ariel Medina, female   DOB: 07-01-1928, 86 y.o.   MRN: [id number] Complaint:  Visit Type: Patient returns to my office for continued preventative foot care services. Complaint: Patient states" my nails have grown long and thick and become painful to walk and wear shoes" . She presents for preventative foot care services. No changes to ROS.  She has painful calluses on the bottom of both feet.  Podiatric Exam: Vascular: dorsalis pedis and posterior tibial pulses are palpable bilateral. Capillary return is immediate. Temperature gradient is WNL. Skin turgor WNL  Sensorium: Normal Semmes Weinstein monofilament test. Normal tactile sensation bilaterally. Nail Exam: Pt has thick disfigured discolored nails with subungual debris noted left foot entire nail hallux through fifth toenails and hallux right foot. Ulcer Exam: There is no evidence of ulcer or pre-ulcerative changes or infection. Orthopedic Exam: Muscle tone and strength are WNL. No limitations in general ROM. No crepitus or effusions noted. Foot type and digits show no abnormalities. HAV  B/L Skin:  Porokeratosis sub fifth mets right.. No infection or ulcers  Diagnosis:  Tinea unguium, Pain in right toe, pain in left toes,  Porokeratosis right foot sub 5th met.  Treatment & Plan Procedures and Treatment: Consent by patient was obtained for treatment procedures. The patient understood the discussion of treatment and procedures well. All questions were answered thoroughly reviewed. Debridement of mycotic and hypertrophic toenails, 1 through 5 bilateral and clearing of subungual debris. No ulceration, no infection noted. Debride porokeratosis. Return Visit-Office Procedure: Patient instructed to return to the office for a follow up visit  10 weeks  for continued evaluation and treatment.   Gardiner Barefoot DPM

## 2019-09-12 ENCOUNTER — Other Ambulatory Visit: Payer: Self-pay | Admitting: Internal Medicine

## 2019-09-26 ENCOUNTER — Other Ambulatory Visit: Payer: Self-pay | Admitting: Internal Medicine

## 2019-09-30 ENCOUNTER — Ambulatory Visit: Payer: Self-pay

## 2019-09-30 ENCOUNTER — Ambulatory Visit: Payer: Medicare HMO | Attending: Internal Medicine

## 2019-09-30 DIAGNOSIS — Z23 Encounter for immunization: Secondary | ICD-10-CM | POA: Insufficient documentation

## 2019-09-30 NOTE — Progress Notes (Signed)
   Covid-19 Vaccination Clinic  Name:  HALIEGH KHURANA    MRN: [id number] DOB: Jun 30, 1928  09/30/2019  [name] was observed post Covid-19 immunization for 15 minutes without incidence. She was provided with Vaccine Information Sheet and instruction to access the V-Safe system.   [name] was instructed to call 911 with any severe reactions post vaccine: Marland Kitchen Difficulty breathing  . Swelling of your face and throat  . A fast heartbeat  . A bad rash all over your body  . Dizziness and weakness    Immunizations Administered    Name Date Dose VIS Date Route   Pfizer COVID-19 Vaccine 09/30/2019  1:47 PM 0.3 mL 07/20/2019 Intramuscular   Manufacturer: [place]   Lot: [id number]   [place]: [id number]

## 2019-10-24 ENCOUNTER — Ambulatory Visit: Payer: Medicare HMO | Attending: Internal Medicine

## 2019-10-24 DIAGNOSIS — Z23 Encounter for immunization: Secondary | ICD-10-CM

## 2019-10-24 NOTE — Progress Notes (Signed)
   Covid-19 Vaccination Clinic  Name:  Ariel Medina    MRN: [id number] DOB: [birth year]  10/24/2019  [name] was observed post Covid-19 immunization for 15 minutes without incident. She was provided with Vaccine Information Sheet and instruction to access the V-Safe system.   [name] was instructed to call 911 with any severe reactions post vaccine: Marland Kitchen Difficulty breathing  . Swelling of face and throat  . A fast heartbeat  . A bad rash all over body  . Dizziness and weakness   Immunizations Administered    Name Date Dose VIS Date Route   Pfizer COVID-19 Vaccine 10/24/2019  1:05 PM 0.3 mL 07/20/2019 Intramuscular   Manufacturer: [place]   Lot: [id number]   [place]: [id number]

## 2019-11-15 ENCOUNTER — Telehealth: Payer: Self-pay

## 2019-11-15 NOTE — Telephone Encounter (Signed)
Pt returned your call.  Please call her at your convenience.  Pt stated she will be at the foot doctor tomorrow.

## 2019-11-15 NOTE — Telephone Encounter (Signed)
I did not call pt.

## 2019-11-16 ENCOUNTER — Other Ambulatory Visit: Payer: Self-pay

## 2019-11-16 ENCOUNTER — Ambulatory Visit (INDEPENDENT_AMBULATORY_CARE_PROVIDER_SITE_OTHER): Payer: Medicare HMO | Admitting: Podiatry

## 2019-11-16 ENCOUNTER — Encounter: Payer: Self-pay | Admitting: Podiatry

## 2019-11-16 VITALS — Temp 97.3°F

## 2019-11-16 DIAGNOSIS — M79676 Pain in unspecified toe(s): Secondary | ICD-10-CM

## 2019-11-16 DIAGNOSIS — Q828 Other specified congenital malformations of skin: Secondary | ICD-10-CM | POA: Diagnosis not present

## 2019-11-16 DIAGNOSIS — B351 Tinea unguium: Secondary | ICD-10-CM

## 2019-11-16 NOTE — Progress Notes (Signed)
This patient returns to the office for evaluation and treatment of long thick painful nails .  This patient is unable to trim his own nails since the patient cannot reach the feet.  Patient says the nails are painful walking and wearing his shoes.  She has painful callus on the outside of her right foot.    She  returns for preventive foot care services.  General Appearance  Alert, conversant and in no acute stress.  Vascular  Dorsalis pedis and posterior tibial  pulses are weakly  palpable  bilaterally.  Capillary return is within normal limits  bilaterally. Temperature is within normal limits  bilaterally.  Neurologic  Senn-Weinstein monofilament wire test within normal limits  bilaterally. Muscle power within normal limits bilaterally.  Nails Thick disfigured discolored nails with subungual debris  from hallux to fifth toes bilaterally. No evidence of bacterial infection or drainage bilaterally.  Orthopedic  No limitations of motion  feet .  No crepitus or effusions noted.  No bony pathology or digital deformities noted. HAV  B/L  Skin  normotropic skin  noted bilaterally.  No signs of infections or ulcers noted.   Porokeratosis sub 5th met right foot.  Callus secondary HAV  B/L.  Onychomycosis  Pain in toes right foot  Pain in toes left foot  Porokeratosis sub 5th met right foot.  Debridement  of nails  1-5  B/L with a nail nipper.  Nails were then filed using a dremel tool with no incidents.  Debride porokeratosis with # 15 blade.    RTC 10 weeks   Gregory Mayer DPM  

## 2019-11-29 ENCOUNTER — Telehealth: Payer: Self-pay | Admitting: Internal Medicine

## 2019-11-29 NOTE — Progress Notes (Signed)
  Chronic Care Management   Outreach Note  11/29/2019 Name: Ariel Medina MRN: [id number] DOB: 17-Jan-1928  Referred by: Colon Branch, MD Reason for referral : No chief complaint on file.   An unsuccessful telephone outreach was attempted today. The patient was referred to the pharmacist for assistance with care management and care coordination.   Follow Up Plan:    Raynicia Dukes [employer] Scheduler

## 2019-12-07 ENCOUNTER — Ambulatory Visit (INDEPENDENT_AMBULATORY_CARE_PROVIDER_SITE_OTHER): Payer: Medicare HMO | Admitting: Internal Medicine

## 2019-12-07 ENCOUNTER — Encounter: Payer: Self-pay | Admitting: Internal Medicine

## 2019-12-07 VITALS — Ht 62.0 in | Wt 125.0 lb

## 2019-12-07 DIAGNOSIS — M545 Low back pain, unspecified: Secondary | ICD-10-CM

## 2019-12-07 MED ORDER — CYCLOBENZAPRINE HCL 5 MG PO TABS: 5.0000 mg | ORAL_TABLET | Freq: Three times a day (TID) | ORAL | 0 refills | Status: DC | PRN

## 2019-12-07 NOTE — Progress Notes (Signed)
Pre visit review using our clinic review tool, if applicable. No additional management support is needed unless otherwise documented below in the visit note. 

## 2019-12-07 NOTE — Progress Notes (Signed)
Subjective:    Patient ID: Ariel Medina, female    DOB: [birth year], 86 y.o.   MRN: [id number]  DOS:  12/07/2019 Type of visit - description: Virtual Visit via Telephone  Attempted  to make this a video visit, due to technical difficulties from the patient side it was not possible  thus we proceeded with a Virtual Visit via Telephone    I connected with above mentioned patient  by telephone and verified that I am speaking with the correct person using two identifiers.  THIS ENCOUNTER IS A VIRTUAL VISIT DUE TO COVID-19 - PATIENT WAS NOT SEEN IN THE OFFICE. PATIENT HAS CONSENTED TO VIRTUAL VISIT / TELEMEDICINE VISIT   Location of patient: home  Location of provider: office  I discussed the limitations, risks, security and privacy concerns of performing an evaluation and management service by telephone and the availability of in person appointments. I also discussed with the patient that there may be a patient responsible charge related to this service. The patient expressed understanding and agreed to proceed.  Acute Recently, the patient did some heavy "spring cleaning" and after that approximately a week ago developed back pain. The back pain happens in different places, could be upper or lower back, does not radiate to the legs. She has a leftover Flexeril which she take with mild relief.  She also takes Tylenol.  Request a refill of the muscle relaxant at a higher dose.  She denies any recent fall or injury No lower extremity paresthesias No fever chills No rash No dysuria or gross hematuria  Review of Systems See above  Past Medical History:  Diagnosis Date  . Breast CA ([place])    left breast-invasive ductal ca stage 1-stopped arimidex 2008  . Diverticulitis of colon   . Hiatal hernia   . Hyperlipidemia   . Hypertension   . Hypothyroidism   . Osteoarthritis   . Osteopenia    DEXA 7/02  . Syncope 2005   CT head (-), ECHO essent. neg, stress test (-), carotid u/s (-)     Past Surgical History:  Procedure Laterality Date  . BREAST BIOPSY Bilateral   . BREAST LUMPECTOMY  04/2002   left  . TONSILLECTOMY AND ADENOIDECTOMY      Social History   Socioeconomic History  . Marital status: Widowed    Spouse name: Not on file  . Number of children: 2  . Years of education: Not on file  . Highest education level: Not on file  Occupational History  . Occupation: n/a    Employer: RETIRED  Tobacco Use  . Smoking status: Never Smoker  . Smokeless tobacco: Never Used  Substance and Sexual Activity  . Alcohol use: No  . Drug use: No  . Sexual activity: Not Currently  Other Topics Concern  . Not on file  Social History Narrative   Lost a son 76   G-son  live w/ her from time to time, not permanently   ([name] is her son and used to be my pt,  h/o etoh)   Sister live next door , does help the pt    G-son @ [employer] , G-daughter @ [employer] since 1993   still drives, short distances, typically not at night.   Social Determinants of Health   Financial Resource Strain:   . Difficulty of Paying Living Expenses:   Food Insecurity:   . Worried About Charity fundraiser in the Last Year:   .  Ran Out of Food in the Last Year:   Transportation Needs:   . Film/video editor (Medical):   Marland Kitchen Lack of Transportation (Non-Medical):   Physical Activity:   . Days of Exercise per Week:   . Minutes of Exercise per Session:   Stress:   . Feeling of Stress :   Social Connections:   . Frequency of Communication with Friends and Family:   . Frequency of Social Gatherings with Friends and Family:   . Attends Religious Services:   . Active Member of Clubs or Organizations:   . Attends Archivist Meetings:   Marland Kitchen Marital Status:   Intimate Partner Violence:   . Fear of Current or Ex-Partner:   . Emotionally Abused:   Marland Kitchen Physically Abused:   . Sexually Abused:       Allergies as of 12/07/2019      Reactions   Ibuprofen     REACTION: bleed      Medication List       Accurate as of December 07, 2019  4:23 PM. If you have any questions, ask your nurse or doctor.        STOP taking these medications   Fluzone High-Dose Quadrivalent 0.7 ML Susy Generic drug: Influenza Vac High-Dose Quad Stopped by: Kathlene November, MD   Shingrix injection Generic drug: Zoster Vaccine Adjuvanted Stopped by: Kathlene November, MD   UNABLE TO FIND Stopped by: Kathlene November, MD     TAKE these medications   acetaminophen 500 MG tablet Commonly known as: TYLENOL Take 500 mg by mouth every 6 (six) hours as needed for mild pain, moderate pain or headache.   aspirin 81 MG tablet Take 81 mg by mouth every morning.   Caltrate 600+D 600-400 MG-UNIT tablet Generic drug: Calcium Carbonate-Vitamin D Take 1 tablet by mouth daily.   cyclobenzaprine 5 MG tablet Commonly known as: FLEXERIL Take 1 tablet (5 mg total) by mouth 2 (two) times daily as needed for muscle spasms.   fluticasone 50 MCG/ACT nasal spray Commonly known as: FLONASE Place 2 sprays into both nostrils daily as needed for allergies or rhinitis.   hydrochlorothiazide 25 MG tablet Commonly known as: HYDRODIURIL Take 1 tablet (25 mg total) by mouth daily.   ICAPS AREDS 2 PO Take 1 capsule by mouth 2 (two) times daily.   levothyroxine 75 MCG tablet Commonly known as: SYNTHROID Take 1 tablet (75 mcg total) by mouth daily before breakfast.   polyethylene glycol 17 g packet Commonly known as: MIRALAX / GLYCOLAX Take 17 g by mouth daily as needed for moderate constipation.   potassium chloride 10 MEQ tablet Commonly known as: KLOR-CON Take 1 tablet (10 mEq total) by mouth daily.   rosuvastatin 5 MG tablet Commonly known as: CRESTOR Take 1 tablet (5 mg total) by mouth daily.          Objective:   Physical Exam Ht 5\' 2"  (1.575 m)   Wt 125 lb (56.7 kg)   BMI 22.86 kg/m  This is a telephone virtual visit, she sounded at baseline, alert oriented x3 and in no apparent  distress    Assessment     Assessment  HTN Hyperlipidemia Hypothyroidism Osteopenia:  --T score -2.1 (04-2012) DC Fosamax 12/2013 after 5 years --T score -1.5   (02-2015)  --T score -3.6 (april 2019) on Prolia DJD GERD  Tremors, likely essential H/o diverticulitis Breast cancer, L breast, release from oncology 2013 Syncope 2005 11-2016: Fall, FX L pubic ramus, admitted then d/c  to a NH, back home 12-18-16 Edema, R>L U/S (-)  for DVT 01/2017  PLAN: Back pain: As described above, she knows the limitations of a telephone evaluation, no red flag symptoms. She is taking Flexeril 5 mg twice a day and Tylenol twice a day with modest help. I advised her that I cannot prescribe if stronger Flexeril but she could take it 3 times daily. Plan: Heating pad, rest, increase Tylenol to 3 times a day, increase Flexeril 5 mg to 3 times a day, office visit in person next week if not improving.  She agreed and verbalized understanding.    I discussed the assessment and treatment plan with the patient. The patient was provided an opportunity to ask questions and all were answered. The patient agreed with the plan and demonstrated an understanding of the instructions.   The patient was advised to call back or seek an in-person evaluation if the symptoms worsen or if the condition fails to improve as anticipated.  I provided 18 minutes of non-face-to-face time during this encounter.  Kathlene November, MD

## 2019-12-08 NOTE — Assessment & Plan Note (Signed)
Back pain: As described above, she knows the limitations of a telephone evaluation, no red flag symptoms. She is taking Flexeril 5 mg twice a day and Tylenol twice a day with modest help. I advised her that I cannot prescribe if stronger Flexeril but she could take it 3 times daily. Plan: Heating pad, rest, increase Tylenol to 3 times a day, increase Flexeril 5 mg to 3 times a day, office visit in person next week if not improving.  She agreed and verbalized understanding.

## 2020-01-15 ENCOUNTER — Telehealth: Payer: Self-pay | Admitting: Internal Medicine

## 2020-01-15 NOTE — Progress Notes (Signed)
  Chronic Care Management   Outreach Note  01/15/2020 Name: Ariel Medina MRN: [id number] DOB: 1928/05/11  Referred by: Colon Branch, MD Reason for referral : No chief complaint on file.   An unsuccessful telephone outreach was attempted today. The patient was referred to the pharmacist for assistance with care management and care coordination.   This note is not being shared with the patient for the following reason: To respect privacy (The patient or proxy has requested that the information not be shared).  Follow Up Plan:   Earney Hamburg Upstream Scheduler

## 2020-01-17 ENCOUNTER — Telehealth: Payer: Self-pay | Admitting: Internal Medicine

## 2020-01-17 NOTE — Progress Notes (Signed)
°  Chronic Care Management   Note  01/17/2020 Name: [name] MRN: [id number] DOB: 13-Mar-1928  Ariel Medina is a 86 y.o. year old female who is a primary care patient of Colon Branch, MD. I reached out to Ariel Medina by phone today in response to a referral sent by [name] PCP, Colon Branch, MD.   [name] was given information about Chronic Care Management services today including:  1. CCM service includes personalized support from designated clinical staff supervised by her physician, including individualized plan of care and coordination with other care providers 2. 24/7 contact phone numbers for assistance for urgent and routine care needs. 3. Service will only be billed when office clinical staff spend 20 minutes or more in a month to coordinate care. 4. Only one practitioner may furnish and bill the service in a calendar month. 5. The patient may stop CCM services at any time (effective at the end of the month) by phone call to the office staff.   Patient agreed to services and verbal consent obtained.  This note is not being shared with the patient for the following reason: To respect privacy (The patient or proxy has requested that the information not be shared). Follow up plan:   Earney [place] Upstream Scheduler

## 2020-01-25 ENCOUNTER — Other Ambulatory Visit: Payer: Self-pay

## 2020-01-25 ENCOUNTER — Ambulatory Visit (INDEPENDENT_AMBULATORY_CARE_PROVIDER_SITE_OTHER): Payer: Medicare HMO | Admitting: Podiatry

## 2020-01-25 ENCOUNTER — Encounter: Payer: Self-pay | Admitting: Podiatry

## 2020-01-25 DIAGNOSIS — L84 Corns and callosities: Secondary | ICD-10-CM | POA: Insufficient documentation

## 2020-01-25 DIAGNOSIS — B351 Tinea unguium: Secondary | ICD-10-CM | POA: Diagnosis not present

## 2020-01-25 DIAGNOSIS — M79676 Pain in unspecified toe(s): Secondary | ICD-10-CM | POA: Diagnosis not present

## 2020-01-25 DIAGNOSIS — Q828 Other specified congenital malformations of skin: Secondary | ICD-10-CM

## 2020-01-25 NOTE — Progress Notes (Signed)
This patient returns to the office for evaluation and treatment of long thick painful nails .  This patient is unable to trim his own nails since the patient cannot reach the feet.  Patient says the nails are painful walking and wearing his shoes.  She has painful callus on the outside of her right foot.    She  returns for preventive foot care services.  She is accompanied by her grandaughter. ° °General Appearance  Alert, conversant and in no acute stress. ° °Vascular  Dorsalis pedis and posterior tibial  pulses are weakly  palpable  bilaterally.  Capillary return is within normal limits  bilaterally. Temperature is within normal limits  bilaterally. ° °Neurologic  Senn-Weinstein monofilament wire test within normal limits  bilaterally. Muscle power within normal limits bilaterally. ° °Nails Thick disfigured discolored nails with subungual debris  from hallux to fifth toes bilaterally. No evidence of bacterial infection or drainage bilaterally. ° °Orthopedic  No limitations of motion  feet .  No crepitus or effusions noted.  No bony pathology or digital deformities noted. HAV  B/L ° °Skin  normotropic skin  noted bilaterally.  No signs of infections or ulcers noted.   Porokeratosis sub 5th met right foot.  Callus secondary HAV  B/L. ° °Onychomycosis  Pain in toes right foot  Pain in toes left foot  Porokeratosis sub 5th met right foot. ° °Debridement  of nails  1-5  B/L with a nail nipper.  Nails were then filed using a dremel tool with no incidents.  Debride porokeratosis with # 15 blade.  Dispersion pad paperwork dispensed to granddaughter.   RTC 10 weeks ° ° °Gregory Mayer DPM  °

## 2020-02-22 ENCOUNTER — Ambulatory Visit: Payer: Medicare HMO | Admitting: Internal Medicine

## 2020-02-29 NOTE — Progress Notes (Signed)
I connected with Jazmynn today by telephone and verified that I am speaking with the correct person using two identifiers. Location patient: home Location provider: work Persons participating in the virtual visit: patient, Marine scientist.    I discussed the limitations, risks, security and privacy concerns of performing an evaluation and management service by telephone and the availability of in person appointments. I also discussed with the patient that there may be a patient responsible charge related to this service. The patient expressed understanding and verbally consented to this telephonic visit.    Interactive audio and video telecommunications were attempted between this provider and patient, however failed, due to patient having technical difficulties OR patient did not have access to video capability.  We continued and completed visit with audio only.  Some vital signs may be absent or patient reported.    Subjective:   Ariel Medina is a 86 y.o. female who presents for Medicare Annual (Subsequent) preventive examination.  Review of Systems    Cardiac Risk Factors include: advanced age (>67men, >34 women);hypertension;dyslipidemia     Objective:     Advanced Directives 12/16/2016 12/01/2016 11/29/2016 11/28/2016  Does Patient Have a Medical Advance Directive? No No Yes Yes  Type of Advance Directive - - Public librarian;Living will  Does patient want to make changes to medical advance directive? - - No - Patient declined -  Copy of [place] in Chart? - - - No - copy requested    Current Medications (verified) Outpatient Encounter Medications as of 03/03/2020  Medication Sig  . acetaminophen (TYLENOL) 500 MG tablet Take 500 mg by mouth every 6 (six) hours as needed for mild pain, moderate pain or headache.   Marland Kitchen aspirin 81 MG tablet Take 81 mg by mouth every morning.   . Calcium Carbonate-Vitamin D (CALTRATE 600+D) 600-400 MG-UNIT tablet Take 1  tablet by mouth daily.  . cyclobenzaprine (FLEXERIL) 5 MG tablet Take 1 tablet (5 mg total) by mouth 3 (three) times daily as needed for muscle spasms.  . hydrochlorothiazide (HYDRODIURIL) 25 MG tablet Take 1 tablet (25 mg total) by mouth daily.  Marland Kitchen levothyroxine (SYNTHROID) 75 MCG tablet Take 1 tablet (75 mcg total) by mouth daily before breakfast.  . Multiple Vitamins-Minerals (ICAPS AREDS 2 PO) Take 1 capsule by mouth 2 (two) times daily.  . polyethylene glycol (MIRALAX / GLYCOLAX) packet Take 17 g by mouth daily as needed for moderate constipation.  . potassium chloride (K-DUR,KLOR-CON) 10 MEQ tablet Take 1 tablet (10 mEq total) by mouth daily.  . rosuvastatin (CRESTOR) 5 MG tablet Take 1 tablet (5 mg total) by mouth daily.  . fluticasone (FLONASE) 50 MCG/ACT nasal spray Place 2 sprays into both nostrils daily as needed for allergies or rhinitis. (Patient not taking: Reported on 03/03/2020)   No facility-administered encounter medications on file as of 03/03/2020.    Allergies (verified) Ibuprofen   History: Past Medical History:  Diagnosis Date  . Breast CA ([place])    left breast-invasive ductal ca stage 1-stopped arimidex 2008  . Diverticulitis of colon   . Hiatal hernia   . Hyperlipidemia   . Hypertension   . Hypothyroidism   . Osteoarthritis   . Osteopenia    DEXA 7/02  . Syncope 2005   CT head (-), ECHO essent. neg, stress test (-), carotid u/s (-)   Past Surgical History:  Procedure Laterality Date  . BREAST BIOPSY Bilateral   . BREAST LUMPECTOMY  04/2002   left  .  TONSILLECTOMY AND ADENOIDECTOMY     Family History  Problem Relation Age of Onset  . Heart attack Brother 3  . Diabetes Mother   . Stroke Father 60  . Stroke Sister 73  . Colon cancer Neg Hx   . Breast cancer Neg Hx    Social History   Socioeconomic History  . Marital status: Widowed    Spouse name: Not on file  . Number of children: 2  . Years of education: Not on file  . Highest education  level: Not on file  Occupational History  . Occupation: n/a    Employer: RETIRED  Tobacco Use  . Smoking status: Never Smoker  . Smokeless tobacco: Never Used  Vaping Use  . Vaping Use: Never used  Substance and Sexual Activity  . Alcohol use: No  . Drug use: No  . Sexual activity: Not Currently  Other Topics Concern  . Not on file  Social History Narrative   Lost a son 40   G-son  live w/ her from time to time, not permanently   ([name] is her son and used to be my pt,  h/o etoh)   Sister live next door , does help the pt    G-son @ [employer] , G-daughter @ [employer] since 1993   still drives, short distances, typically not at night.   Social Determinants of Health   Financial Resource Strain: Low Risk   . Difficulty of Paying Living Expenses: Not hard at all  Food Insecurity: No Food Insecurity  . Worried About Charity fundraiser in the Last Year: Never true  . Ran Out of Food in the Last Year: Never true  Transportation Needs: No Transportation Needs  . Lack of Transportation (Medical): No  . Lack of Transportation (Non-Medical): No  Physical Activity:   . Days of Exercise per Week:   . Minutes of Exercise per Session:   Stress:   . Feeling of Stress :   Social Connections:   . Frequency of Communication with Friends and Family:   . Frequency of Social Gatherings with Friends and Family:   . Attends Religious Services:   . Active Member of Clubs or Organizations:   . Attends Archivist Meetings:   Marland Kitchen Marital Status:     Tobacco Counseling Counseling given: Not Answered   Clinical Intake: Pain : No/denies pain    Activities of Daily Living In your present state of health, do you have any difficulty performing the following activities: 03/03/2020 06/26/2019  Hearing? N N  Vision? N N  Difficulty concentrating or making decisions? N N  Walking or climbing stairs? N Y  Dressing or bathing? N N  Doing errands, shopping? N N    Preparing Food and eating ? N -  Using the Toilet? N -  In the past six months, have you accidently leaked urine? N -  Do you have problems with loss of bowel control? N -  Managing your Medications? N -  Managing your Finances? N -  Housekeeping or managing your Housekeeping? N -  Some recent data might be hidden    Patient Care Team: Colon Branch, MD as PCP - General Ernesto Rutherford Maudry Mayhew, MD as Consulting Physician (Otolaryngology) Gardiner Barefoot, DPM as Consulting Physician (Podiatry) Day, Melvenia Beam, [facility] as Pharmacist (Pharmacist)  Indicate any recent Medical Services you may have received from other than [facility] providers in the past year (date may be approximate).  Assessment:   This is a routine wellness examination for [place].  Dietary issues and exercise activities discussed: Current Exercise Habits: The patient does not participate in regular exercise at present, Exercise limited by: orthopedic condition(s) Diet (meal preparation, eat out, water intake, caffeinated beverages, dairy products, fruits and vegetables): well balanced   Goals    . Maintain health of mind and body and maintain independence      Depression Screen PHQ 2/9 Scores 03/03/2020 03/03/2020 06/26/2019 07/25/2017 07/20/2016 05/13/2015 06/10/2014  PHQ - 2 Score 0 0 0 0 0 0 0    Fall Risk Fall Risk  03/03/2020 06/26/2019 07/25/2017 07/20/2016 05/13/2015  Falls in the past year? 1 0 No No No  Number falls in past yr: 0 - - - -  Injury with Fall? 0 - - - -  Follow up Education provided;Falls prevention discussed Falls evaluation completed - - -   Lives in 1 story home. Grandson stays with her. Sister lives next door.  Any stairs in or around the home? No  If so, are there any without handrails? No  Home free of loose throw rugs in walkways, pet beds, electrical cords, etc? Yes  Adequate lighting in your home to reduce risk of falls? Yes   ASSISTIVE DEVICES UTILIZED TO PREVENT FALLS:  Life alert? No  Use  of a cane, walker or w/c? Yes  Grab bars in the bathroom? Yes  Shower chair or bench in shower? Yes  Elevated toilet seat or a handicapped toilet? Yes     Cognitive Function:       6CIT Screen 03/03/2020  What Year? 0 points  What month? 0 points  What time? 0 points  Count back from 20 0 points  Months in reverse 0 points  Repeat phrase 0 points  Total Score 0    Immunizations Immunization History  Administered Date(s) Administered  . H1N1 08/29/2008  . Influenza Whole 05/12/2010  . Influenza, High Dose Seasonal PF 06/07/2013, 05/13/2015, 07/20/2016, 04/20/2017, 05/25/2018, 05/28/2019  . Influenza,inj,Quad PF,6+ Mos 06/10/2014  . PFIZER SARS-COV-2 Vaccination 09/30/2019, 10/24/2019  . PPD Test 12/14/2016  . Pneumococcal Conjugate-13 06/10/2014  . Pneumococcal Polysaccharide-23 07/10/2003, 02/21/2008, 11/23/2017  . Td 05/16/2007, 05/25/2018  . Zoster 12/13/2012  . Zoster Recombinat (Shingrix) 10/12/2018, 06/28/2019    TDAP status: Up to date Flu Vaccine status: Up to date Pneumococcal vaccine status: Up to date Covid-19 vaccine status: Completed vaccines  Qualifies for Shingles Vaccine? Yes   Zostavax completed Yes   Shingrix Completed?: Yes  Screening Tests Health Maintenance  Topic Date Due  . INFLUENZA VACCINE  03/09/2020  . MAMMOGRAM  06/25/2021  . TETANUS/TDAP  05/25/2028  . DEXA SCAN  Completed  . COVID-19 Vaccine  Completed  . PNA vac Low Risk Adult  Completed    Health Maintenance  There are no preventive care reminders to display for this patient.  Colorectal cancer screening: No longer required.  Mammogram status: Completed 06/26/19. Repeat every year Bone Density status: Completed 11/23/17. Results reflect: Bone density results: OSTEOPOROSIS. Repeat every 2 years.  Lung Cancer Screening: (Low Dose CT Chest recommended if Age 63-80 years, 30 pack-year currently smoking OR have quit w/in 15years.) does not qualify.    Additional  Screening:  Vision Screening: Recommended annual ophthalmology exams for early detection of glaucoma and other disorders of the eye. Is the patient up to date with their annual eye exam?  Yes   Dental Screening: Recommended annual dental exams for proper oral hygiene  Community  Resource Referral / Chronic Care Management: CRR required this visit?  No   CCM required this visit?  No      Plan:   See you next year!  Continue to eat heart healthy diet (full of fruits, vegetables, whole grains, lean protein, water--limit salt, fat, and sugar intake) and increase physical activity as tolerated.  Continue doing brain stimulating activities (puzzles, reading, adult coloring books, staying active) to keep memory sharp.     I have personally reviewed and noted the following in the patient's chart:   . Medical and social history . Use of alcohol, tobacco or illicit drugs  . Current medications and supplements . Functional ability and status . Nutritional status . Physical activity . Advanced directives . List of other physicians . Hospitalizations, surgeries, and ER visits in previous 12 months . Vitals . Screenings to include cognitive, depression, and falls . Referrals and appointments  In addition, I have reviewed and discussed with patient certain preventive protocols, quality metrics, and best practice recommendations. A written personalized care plan for preventive services as well as general preventive health recommendations were provided to patient.   Due to this being a telephonic visit, the after visit summary with patients personalized plan was offered to patient via mail or my-chart. Patient declined at this time. Naaman Plummer [place], [place]   03/03/2020

## 2020-03-03 ENCOUNTER — Ambulatory Visit (INDEPENDENT_AMBULATORY_CARE_PROVIDER_SITE_OTHER): Payer: Medicare HMO | Admitting: *Deleted

## 2020-03-03 ENCOUNTER — Other Ambulatory Visit: Payer: Self-pay

## 2020-03-03 DIAGNOSIS — Z0000 Encounter for general adult medical examination without abnormal findings: Secondary | ICD-10-CM

## 2020-03-03 NOTE — Patient Instructions (Signed)
See you next year!  Continue to eat heart healthy diet (full of fruits, vegetables, whole grains, lean protein, water--limit salt, fat, and sugar intake) and increase physical activity as tolerated.  Continue doing brain stimulating activities (puzzles, reading, adult coloring books, staying active) to keep memory sharp.    Ariel Medina , Thank you for taking time to come for your Medicare Wellness Visit. I appreciate your ongoing commitment to your health goals. Please review the following plan we discussed and let me know if I can assist you in the future.   These are the goals we discussed: Goals    . Maintain health of mind and body and maintain independence       This is a list of the screening recommended for you and due dates:  Health Maintenance  Topic Date Due  . Flu Shot  03/09/2020  . Mammogram  06/25/2021  . Tetanus Vaccine  05/25/2028  . DEXA scan (bone density measurement)  Completed  . COVID-19 Vaccine  Completed  . Pneumonia vaccines  Completed

## 2020-04-03 ENCOUNTER — Other Ambulatory Visit: Payer: Self-pay | Admitting: Internal Medicine

## 2020-04-04 ENCOUNTER — Other Ambulatory Visit: Payer: Self-pay

## 2020-04-04 ENCOUNTER — Ambulatory Visit (INDEPENDENT_AMBULATORY_CARE_PROVIDER_SITE_OTHER): Payer: Medicare HMO | Admitting: Podiatry

## 2020-04-04 ENCOUNTER — Encounter: Payer: Self-pay | Admitting: Podiatry

## 2020-04-04 DIAGNOSIS — M79676 Pain in unspecified toe(s): Secondary | ICD-10-CM

## 2020-04-04 DIAGNOSIS — Q828 Other specified congenital malformations of skin: Secondary | ICD-10-CM

## 2020-04-04 DIAGNOSIS — B351 Tinea unguium: Secondary | ICD-10-CM | POA: Diagnosis not present

## 2020-04-04 NOTE — Progress Notes (Signed)
This patient returns to the office for evaluation and treatment of long thick painful nails .  This patient is unable to trim her own nails since the patient cannot reach the feet.  Patient says the nails are painful walking and wearing her shoes.  She has painful callus on the outside of her right foot.    She  returns for preventive foot care services.   General Appearance  Alert, conversant and in no acute stress.  Vascular  Dorsalis pedis and posterior tibial  pulses are weakly  palpable  bilaterally.  Capillary return is within normal limits  bilaterally. Temperature is within normal limits  bilaterally.  Neurologic  Senn-Weinstein monofilament wire test within normal limits  bilaterally. Muscle power within normal limits bilaterally.  Nails Thick disfigured discolored nails with subungual debris  from hallux to fifth toes bilaterally. No evidence of bacterial infection or drainage bilaterally.  Orthopedic  No limitations of motion  feet .  No crepitus or effusions noted.  No bony pathology or digital deformities noted. HAV  B/L  Skin  normotropic skin  noted bilaterally.  No signs of infections or ulcers noted.   Porokeratosis sub 5th met right foot.  Callus secondary HAV  B/L.  Onychomycosis  Pain in toes right foot  Pain in toes left foot  Porokeratosis sub 5th met right foot.  Debridement  of nails  1-5  B/L with a nail nipper.  Nails were then filed using a dremel tool with no incidents.  Debride porokeratosis with # 15 blade.     RTC 10 weeks   Gardiner Barefoot DPM

## 2020-04-09 ENCOUNTER — Other Ambulatory Visit: Payer: Self-pay

## 2020-04-09 ENCOUNTER — Telehealth: Payer: Self-pay | Admitting: Pharmacist

## 2020-04-09 DIAGNOSIS — K922 Gastrointestinal hemorrhage, unspecified: Secondary | ICD-10-CM

## 2020-04-09 DIAGNOSIS — I10 Essential (primary) hypertension: Secondary | ICD-10-CM

## 2020-04-09 DIAGNOSIS — E039 Hypothyroidism, unspecified: Secondary | ICD-10-CM

## 2020-04-09 DIAGNOSIS — E785 Hyperlipidemia, unspecified: Secondary | ICD-10-CM

## 2020-04-09 HISTORY — DX: Gastrointestinal hemorrhage, unspecified: K92.2

## 2020-04-09 NOTE — Progress Notes (Addendum)
    Chronic Care Management Pharmacy Assistant   Name: HAELY LEYLAND  MRN: [id number] DOB: 10-01-1927  Reason for Encounter: Initial Questions  PCP : Colon Branch, MD  Allergies:   Allergies  Allergen Reactions  . Ibuprofen     REACTION: bleed    Medications: Outpatient Encounter Medications as of 04/09/2020  Medication Sig Note  . acetaminophen (TYLENOL) 500 MG tablet Take 500 mg by mouth every 6 (six) hours as needed for mild pain, moderate pain or headache.  04/20/2017: PRN  . aspirin 81 MG tablet Take 81 mg by mouth every morning.    . Calcium Carbonate-Vitamin D (CALTRATE 600+D) 600-400 MG-UNIT tablet Take 1 tablet by mouth daily.   . cyclobenzaprine (FLEXERIL) 5 MG tablet Take 1 tablet (5 mg total) by mouth 3 (three) times daily as needed for muscle spasms.   . fluticasone (FLONASE) 50 MCG/ACT nasal spray Place 2 sprays into both nostrils daily as needed for allergies or rhinitis.   . hydrochlorothiazide (HYDRODIURIL) 25 MG tablet TAKE 1 TABLET EVERY DAY   . levothyroxine (SYNTHROID) 75 MCG tablet Take 1 tablet (75 mcg total) by mouth daily before breakfast.   . Multiple Vitamins-Minerals (ICAPS AREDS 2 PO) Take 1 capsule by mouth 2 (two) times daily.   . polyethylene glycol (MIRALAX / GLYCOLAX) packet Take 17 g by mouth daily as needed for moderate constipation. 04/20/2017: PRN  . potassium chloride (K-DUR,KLOR-CON) 10 MEQ tablet Take 1 tablet (10 mEq total) by mouth daily.   . rosuvastatin (CRESTOR) 5 MG tablet Take 1 tablet (5 mg total) by mouth daily.    No facility-administered encounter medications on file as of 04/09/2020.    Current Diagnosis: Patient Active Problem List   Diagnosis Date Noted  . Pre-ulcerative calluses 01/25/2020  . Essential hypertension 11/29/2016  . Closed fracture of multiple pubic rami ([place]) 11/29/2016  . Fall   . Closed fracture of left pubis ([place]) 11/28/2016  . Hypokalemia 11/28/2016  . Hearing loss 09/19/2015  . PCP NOTES >>> 05/13/2015   . Allergic rhinitis 10/24/2014  . Annual physical exam 04/14/2011  . BACK PAIN, shoulder pain 12/11/2007  . Osteoporosis 10/20/2007  . NEOPLASM, MALIGNANT, BREAST, HX OF 10/20/2007  . Hypothyroidism 01/30/2007  . Hyperlipidemia 01/30/2007  . HTN and mild edema 01/30/2007  . OSTEOARTHRITIS 01/30/2007    Goals Addressed   None     Have you seen any other providers since your last visit? Yes Any changes in your medications or health? No Any side effects from any medications? No Do you have an symptoms or problems not managed by your medications? No Any concerns about your health right now? No Has your provider asked that you check blood pressure, blood sugar, or follow special diet at home? No Do you get any type of exercise on a regular basis? No Can you think of a goal you would like to reach for your health? No Do you have any problems getting your medications?  Is there anything that you would like to discuss during the appointment? No, patient states she receives her medication through mail order.  Please bring medications and supplements to appointment   Follow-Up:  Pharmacist Review   Fanny Skates, [place] Pharmacist Assistant [phone]  Reviewed by: De Blanch, PharmD Clinical Pharmacist [facility] [phone]

## 2020-04-11 ENCOUNTER — Ambulatory Visit: Payer: Medicare HMO | Admitting: Pharmacist

## 2020-04-11 ENCOUNTER — Other Ambulatory Visit: Payer: Self-pay

## 2020-04-11 DIAGNOSIS — I10 Essential (primary) hypertension: Secondary | ICD-10-CM

## 2020-04-11 DIAGNOSIS — E785 Hyperlipidemia, unspecified: Secondary | ICD-10-CM

## 2020-04-11 DIAGNOSIS — M810 Age-related osteoporosis without current pathological fracture: Secondary | ICD-10-CM

## 2020-04-11 NOTE — Chronic Care Management (AMB) (Signed)
Chronic Care Management Pharmacy  Name: Ariel Medina  MRN: [id number] DOB: Apr 27, 1928  Chief Complaint/ HPI  Ariel Medina,  86 y.o. , female presents for their Initial CCM visit with the clinical pharmacist via telephone due to COVID-19 Pandemic.  PCP : Colon Branch, MD  Their chronic conditions include: Hypertension, Hyperlipidemia, Hypothyroidism, Osteoporosis  Office Visits: 03/03/20: Medicare Annual Wellness Exam w/ Naaman Plummer, RN - Goal updated to maintain health of mind and body and maintain independence  12/07/19: Visit w/ Dr. [name] - Acute low back pain. Refill cyclobenzaprine plus tylenol, heating pad, rest.   Consult Visit: 04/04/20: Podiatry visit w/ Dr. [name] - Debridement  01/25/20: Podiatry visit w/ Dr. [name] - Debridement  Medications: Outpatient Encounter Medications as of 04/11/2020  Medication Sig Note  . aspirin 81 MG tablet Take 81 mg by mouth every morning.    . cyclobenzaprine (FLEXERIL) 5 MG tablet Take 1 tablet (5 mg total) by mouth 3 (three) times daily as needed for muscle spasms. 04/11/2020: Uses PRN  . hydrochlorothiazide (HYDRODIURIL) 25 MG tablet TAKE 1 TABLET EVERY Esaul Dorwart   . levothyroxine (SYNTHROID) 75 MCG tablet Take 1 tablet (75 mcg total) by mouth daily before breakfast.   . Multiple Vitamins-Minerals (ICAPS AREDS 2 PO) Take 1 capsule by mouth 2 (two) times daily.   . rosuvastatin (CRESTOR) 5 MG tablet Take 1 tablet (5 mg total) by mouth daily. 04/11/2020: Not taking daily, takes about 2-3 times per week  . acetaminophen (TYLENOL) 500 MG tablet Take 500 mg by mouth every 6 (six) hours as needed for mild pain, moderate pain or headache.  04/20/2017: PRN  . Calcium Carbonate-Vitamin D (CALTRATE 600+D) 600-400 MG-UNIT tablet Take 1 tablet by mouth daily.   . fluticasone (FLONASE) 50 MCG/ACT nasal spray Place 2 sprays into both nostrils daily as needed for allergies or rhinitis. (Patient not taking: Reported on 04/11/2020)   . polyethylene glycol (MIRALAX /  GLYCOLAX) packet Take 17 g by mouth daily as needed for moderate constipation. 04/20/2017: PRN  . potassium chloride (K-DUR,KLOR-CON) 10 MEQ tablet Take 1 tablet (10 mEq total) by mouth daily.    No facility-administered encounter medications on file as of 04/11/2020.   SDOH Screenings   Alcohol Screen:   . Last Alcohol Screening Score (AUDIT): Not on file  Depression (PHQ2-9): Low Risk   . PHQ-2 Score: 0  Financial Resource Strain: Low Risk   . Difficulty of Paying Living Expenses: Not hard at all  Food Insecurity: No Food Insecurity  . Worried About Charity fundraiser in the Last Year: Never true  . Ran Out of Food in the Last Year: Never true  Housing: Low Risk   . Last Housing Risk Score: 0  Physical Activity:   . Days of Exercise per Week: Not on file  . Minutes of Exercise per Session: Not on file  Social Connections:   . Frequency of Communication with Friends and Family: Not on file  . Frequency of Social Gatherings with Friends and Family: Not on file  . Attends Religious Services: Not on file  . Active Member of Clubs or Organizations: Not on file  . Attends Archivist Meetings: Not on file  . Marital Status: Not on file  Stress: No Stress Concern Present  . Feeling of Stress : Only a little  Tobacco Use: Low Risk   . Smoking Tobacco Use: Never Smoker  . Smokeless Tobacco Use: Never Used  Transportation Needs: No Transportation Needs  .  Lack of Transportation (Medical): No  . Lack of Transportation (Non-Medical): No     Current Diagnosis/Assessment:  Goals Addressed            This Visit's Progress   . Chronic Care Management Pharmacy Care Plan       CARE PLAN ENTRY (see longitudinal plan of care for additional care plan information)  Current Barriers:  . Chronic Disease Management support, education, and care coordination needs related to  Hypertension, Hyperlipidemia, Hypothyroidism, Osteoporosis   Hypertension BP Readings from Last 3  Encounters:  06/26/19 (!) 154/96  05/25/18 126/62  11/23/17 130/79   . Pharmacist Clinical Goal(s): o Over the next 90 days, patient will work with PharmD and providers to maintain BP goal <140/90 . Current regimen:  o HCTZ 51m daily . Patient self care activities - Over the next 90 days, patient will: o Maintain hypertension medication regimen.   Hyperlipidemia Lab Results  Component Value Date/Time   LDLCALC 169 (H) 06/26/2019 09:54 AM   LDLDIRECT 150.2 06/07/2013 09:07 AM   . Pharmacist Clinical Goal(s): o Over the next 90 days, patient will work with PharmD and providers to achieve LDL goal < 100 . Current regimen:  o Rosuvastatin 516mdaily (only taking 2-3 times per week) . Interventions: o Requested patient to daily rosuvastatin 3-4 times per week consistently . Patient self care activities - Over the next 90 days, patient will: o Take rosuvastatin 3-4 times per week consistently  Osteoporosis  . Pharmacist Clinical Goal(s) o Over the next 90 days, patient will work with PharmD and providers to reduce risk of fracture due to osteoporosis  . Current regimen:  o Calcium/Vitamin D 60057m00 units daily . Interventions: o Consider consistently taking calcium and vitamin D daily o Collaboration with provider regarding medication management (Prolia restart) . Patient self care activities - Over the next 90 days, patient will: o Maintain osteoporosis medication regimen   Medication management . Pharmacist Clinical Goal(s): o Over the next 90 days, patient will work with PharmD and providers to maintain optimal medication adherence . Current pharmacy: [facility] o Comprehensive medication review performed. o Continue current medication management strategy . Patient self care activities - Over the next 90 days, patient will: o Focus on medication adherence by filling and taking medications appropriately  o Take medications as prescribed o Report  any questions or concerns to PharmD and/or provider(s)  Initial goal documentation       Social Hx:  [place] in [place] father was a tobacco farmer.  Nephew is battling cancer.  Hypertension   BP goal is:  <140/90  Office blood pressures are  BP Readings from Last 3 Encounters:  06/26/19 (!) 154/96  05/25/18 126/62  11/23/17 130/79   Patient checks BP at home Never, does not have a cuff Patient home BP readings are ranging: Unable to assess  Patient has failed these meds in the past: None noted  Patient is currently controlled on the following medications:  . HCTZ 16m69mily  States she has to get her BP checked in her R arm due to having a lump in her R breast and going through radiation.  Plan -Continue current medications     Hyperlipidemia   LDL goal <100  Lipid Panel     Component Value Date/Time   CHOL 248 (H) 06/26/2019 0954   TRIG 96.0 06/26/2019 0954   TRIG 77 06/14/2006 0824   HDL 60.60 06/26/2019 0954  LDLCALC 169 (H) 06/26/2019 0954   LDLDIRECT 150.2 06/07/2013 0907    Hepatic Function Latest Ref Rng & Units 06/26/2019 05/25/2018 07/25/2017  Total Protein 6.0 - 8.3 g/dL 6.2 6.2 6.5  Albumin 3.5 - 5.2 g/dL 4.0 3.9 3.9  AST 0 - 37 U/L 16 14 18   ALT 0 - 35 U/L 7 6 8   Alk Phosphatase 39 - 117 U/L 45 37(L) 40  Total Bilirubin 0.2 - 1.2 mg/dL 0.8 0.6 0.6     The ASCVD Risk score (Joshua Tree., et al., 2013) failed to calculate for the following reasons:   The 2013 ASCVD risk score is only valid for ages 53 to 78   Patient has failed these meds in past: None noted  Patient is currently uncontrolled on the following medications:  . Rosuvastatin 41m daily  She does not take rosuvastatin daily because she feels it will ruin her liver.  She takes the medication about twice weekly, but not consistently.  Diet Loves sweets and bread.  "I love junk food" B - Cookies; bacon; sausage and grits (doesn't eat eggs as often) L -  [place] sandwich D - Varies Snacks - cookies, potatoe chips, crackers Drinks - coke, pepsi, dr. PMalachi Bonds 2-3 cups of coffee w/ milk (no sugar or sweetener)  Exercise Limited by pain  We discussed:  LDL goal and benefit of statin therapy for stroke and heart attack prevention  Plan -Consider taking rosuvastatin 3-4 times per week consistently  Hypothyroidism   Lab Results  Component Value Date/Time   TSH 3.44 06/26/2019 09:54 AM   TSH 2.19 01/26/2019 10:19 AM    Patient has failed these meds in past: None noted  Patient is currently controlled on the following medications:  . Levothyroxine 77m daily  Plan -Continue current medications  Osteoporosis   Last DEXA Scan: 11/23/2017  T-Score forearm radius: -3.6  Vit D, 25-Hydroxy  Date Value Ref Range Status  11/11/2011 44 30 - 89 ng/mL Final    Comment:    This assay accurately quantifies Vitamin D, which is the sum of the25-Hydroxy forms of Vitamin D2 and D3.  Studies have shown that theoptimum concentration of 25-Hydroxy Vitamin D is 30 ng/mL or higher. Concentrations of Vitamin D between 20 and 29 ng/mL  are considered tobe insufficient and concentrations less than 20 ng/mL are consideredto be deficient for Vitamin D.    Patient has failed these meds in past: None noted  Patient is currently uncontrolled on the following medications:  . Calcium/Vitamin D 60085m00 units daily  Was taking Prolia, but has not had an injection in over 6 months.  She does not take calcium/vitamin D daily, but realizes she needs to start daily  We discussed:  Recommend [phone] units of vitamin D daily. Recommend 1200 mg of calcium daily from dietary and supplemental sources.  Plan -Set up Prolia injection for next office visit on 04/29/20 if possible -Consistently take calcium/vitamin D -Continue current medications  Vaccines   Reviewed and discussed patient's vaccination history.   Patient up to date on vaccines.   Immunization  History  Administered Date(s) Administered  . H1N1 08/29/2008  . Influenza Whole 05/12/2010  . Influenza, High Dose Seasonal PF 06/07/2013, 05/13/2015, 07/20/2016, 04/20/2017, 05/25/2018, 05/28/2019  . Influenza,inj,Quad PF,6+ Mos 06/10/2014  . PFIZER SARS-COV-2 Vaccination 09/30/2019, 10/24/2019  . PPD Test 12/14/2016  . Pneumococcal Conjugate-13 06/10/2014  . Pneumococcal Polysaccharide-23 07/10/2003, 02/21/2008, 11/23/2017  . Td 05/16/2007, 05/25/2018  . Zoster 12/13/2012  . Zoster Recombinat (Shingrix)  10/12/2018, 06/28/2019    Medication Management   Pt uses [facility] for all medications Uses pill box? Yes Pt endorses 100% compliance   Plan -Continue current medication management strategy  Follow up:  3 month general check in with Pharmacy Team CMA 6 month phone visit with Pharmacist  Miscellaneous Meds Does not take any of these medications daily Acetaminophen 514m  Aspirin 821mCyclobenzaprine 68m368mFluticasone 54m24multivitamin (AEREDs 2) Miralax Potassium Chloride 10mE29m

## 2020-04-11 NOTE — Patient Instructions (Signed)
Visit Information  Goals Addressed            This Visit's Progress    Chronic Care Management Pharmacy Care Plan       CARE PLAN ENTRY (see longitudinal plan of care for additional care plan information)  Current Barriers:   Chronic Disease Management support, education, and care coordination needs related to  Hypertension, Hyperlipidemia, Hypothyroidism, Osteoporosis   Hypertension BP Readings from Last 3 Encounters:  06/26/19 (!) 154/96  05/25/18 126/62  11/23/17 130/79    Pharmacist Clinical Goal(s): o Over the next 90 days, patient will work with PharmD and providers to maintain BP goal <140/90  Current regimen:  o HCTZ 25mg  daily  Patient self care activities - Over the next 90 days, patient will: o Maintain hypertension medication regimen.   Hyperlipidemia Lab Results  Component Value Date/Time   LDLCALC 169 (H) 06/26/2019 09:54 AM   LDLDIRECT 150.2 06/07/2013 09:07 AM    Pharmacist Clinical Goal(s): o Over the next 90 days, patient will work with PharmD and providers to achieve LDL goal < 100  Current regimen:  o Rosuvastatin 5mg  daily (only taking 2-3 times per week)  Interventions: o Requested patient to daily rosuvastatin 3-4 times per week consistently  Patient self care activities - Over the next 90 days, patient will: o Take rosuvastatin 3-4 times per week consistently  Osteoporosis   Pharmacist Clinical Goal(s) o Over the next 90 days, patient will work with PharmD and providers to reduce risk of fracture due to osteoporosis   Current regimen:  o Calcium/Vitamin D 600mg /400 units daily  Interventions: o Consider consistently taking calcium and vitamin D daily o Collaboration with provider regarding medication management (Prolia restart)  Patient self care activities - Over the next 90 days, patient will: o Maintain osteoporosis medication regimen   Medication management  Pharmacist Clinical Goal(s): o Over the next 90 days, patient  will work with PharmD and providers to maintain optimal medication adherence  Current pharmacy: [facility]  Interventions o Comprehensive medication review performed. o Continue current medication management strategy  Patient self care activities - Over the next 90 days, patient will: o Focus on medication adherence by filling and taking medications appropriately  o Take medications as prescribed o Report any questions or concerns to PharmD and/or provider(s)  Initial goal documentation        [name] was given information about Chronic Care Management services today including:  1. CCM service includes personalized support from designated clinical staff supervised by her physician, including individualized plan of care and coordination with other care providers 2. 24/7 contact phone numbers for assistance for urgent and routine care needs. 3. Standard insurance, coinsurance, copays and deductibles apply for chronic care management only during months in which we provide at least 20 minutes of these services. Most insurances cover these services at 100%, however patients may be responsible for any copay, coinsurance and/or deductible if applicable. This service may help you avoid the need for more expensive face-to-face services. 4. Only one practitioner may furnish and bill the service in a calendar month. 5. The patient may stop CCM services at any time (effective at the end of the month) by phone call to the office staff.  Patient agreed to services and verbal consent obtained.   The patient verbalized understanding of instructions provided today and agreed to receive a mailed copy of patient instruction and/or educational materials. Telephone follow up appointment with pharmacy team member scheduled for: 10/09/2020  De Blanch, PharmD Clinical Pharmacist [facility] at [facility] [phone]   Cholesterol Content in Foods Cholesterol is a waxy,  fat-like substance that helps to carry fat in the blood. The body needs cholesterol in small amounts, but too much cholesterol can cause damage to the arteries and heart. Most people should eat less than 200 milligrams (mg) of cholesterol a Bertie Simien. Foods with cholesterol  Cholesterol is found in animal-based foods, such as meat, seafood, and dairy. Generally, low-fat dairy and lean meats have less cholesterol than full-fat dairy and fatty meats. The milligrams of cholesterol per serving (mg per serving) of common cholesterol-containing foods are listed below. Meat and other proteins  Egg -- one large whole egg has 186 mg.  Veal shank -- 4 oz has 141 mg.  Lean ground [place] (93% lean) -- 4 oz has 118 mg.  Fat-trimmed lamb loin -- 4 oz has 106 mg.  Lean ground beef (90% lean) -- 4 oz has 100 mg.  Lobster -- 3.5 oz has 90 mg.  Pork loin chops -- 4 oz has 86 mg.  Canned salmon -- 3.5 oz has 83 mg.  Fat-trimmed beef top loin -- 4 oz has 78 mg.  Frankfurter -- 1 frank (3.5 oz) has 77 mg.  Crab -- 3.5 oz has 71 mg.  Roasted chicken without skin, white meat -- 4 oz has 66 mg.  Light bologna -- 2 oz has 45 mg.  Deli-cut [place] -- 2 oz has 31 mg.  Canned tuna -- 3.5 oz has 31 mg.  Berniece Salines -- 1 oz has 29 mg.  Oysters and mussels (raw) -- 3.5 oz has 25 mg.  Mackerel -- 1 oz has 22 mg.  Trout -- 1 oz has 20 mg.  Pork sausage -- 1 link (1 oz) has 17 mg.  Salmon -- 1 oz has 16 mg.  Tilapia -- 1 oz has 14 mg. Dairy  Soft-serve ice cream --  cup (4 oz) has 103 mg.  Whole-milk yogurt -- 1 cup (8 oz) has 29 mg.  Cheddar cheese -- 1 oz has 28 mg.  American cheese -- 1 oz has 28 mg.  Whole milk -- 1 cup (8 oz) has 23 mg.  2% milk -- 1 cup (8 oz) has 18 mg.  Cream cheese -- 1 tablespoon (Tbsp) has 15 mg.  Cottage cheese --  cup (4 oz) has 14 mg.  Low-fat (1%) milk -- 1 cup (8 oz) has 10 mg.  Sour cream -- 1 Tbsp has 8.5 mg.  Low-fat yogurt -- 1 cup (8 oz) has 8  mg.  Nonfat Greek yogurt -- 1 cup (8 oz) has 7 mg.  Half-and-half cream -- 1 Tbsp has 5 mg. Fats and oils  Cod liver oil -- 1 tablespoon (Tbsp) has 82 mg.  Butter -- 1 Tbsp has 15 mg.  Lard -- 1 Tbsp has 14 mg.  Bacon grease -- 1 Tbsp has 14 mg.  Mayonnaise -- 1 Tbsp has 5-10 mg.  Margarine -- 1 Tbsp has 3-10 mg. Exact amounts of cholesterol in these foods may vary depending on specific ingredients and brands. Foods without cholesterol Most plant-based foods do not have cholesterol unless you combine them with a food that has cholesterol. Foods without cholesterol include:  Grains and cereals.  Vegetables.  Fruits.  Vegetable oils, such as olive, canola, and sunflower oil.  Legumes, such as peas, beans, and lentils.  Nuts and seeds.  Egg whites. Summary  The body needs cholesterol  in small amounts, but too much cholesterol can cause damage to the arteries and heart.  Most people should eat less than 200 milligrams (mg) of cholesterol a Sheron Tallman. This information is not intended to replace advice given to you by your health care provider. Make sure you discuss any questions you have with your health care provider. Document Revised: 07/08/2017 Document Reviewed: 03/22/2017 Elsevier Patient Education  [place].

## 2020-04-25 ENCOUNTER — Ambulatory Visit (INDEPENDENT_AMBULATORY_CARE_PROVIDER_SITE_OTHER): Payer: Medicare HMO | Admitting: Medical

## 2020-04-25 ENCOUNTER — Other Ambulatory Visit: Payer: Self-pay

## 2020-04-25 ENCOUNTER — Ambulatory Visit (HOSPITAL_BASED_OUTPATIENT_CLINIC_OR_DEPARTMENT_OTHER)
Admission: RE | Admit: 2020-04-25 | Discharge: 2020-04-25 | Disposition: A | Discharge status: Home / Self Care | Payer: Medicare HMO | Source: Ambulatory Visit | Attending: Medical | Admitting: Medical

## 2020-04-25 VITALS — BP 143/73 | HR 91 | Temp 98.4°F | Resp 13 | Ht 63.0 in | Wt 124.2 lb

## 2020-04-25 DIAGNOSIS — M160 Bilateral primary osteoarthritis of hip: Secondary | ICD-10-CM | POA: Diagnosis not present

## 2020-04-25 DIAGNOSIS — R1013 Epigastric pain: Secondary | ICD-10-CM | POA: Diagnosis not present

## 2020-04-25 DIAGNOSIS — R197 Diarrhea, unspecified: Secondary | ICD-10-CM | POA: Diagnosis not present

## 2020-04-25 DIAGNOSIS — K5939 Other megacolon: Secondary | ICD-10-CM | POA: Diagnosis not present

## 2020-04-25 DIAGNOSIS — R109 Unspecified abdominal pain: Secondary | ICD-10-CM | POA: Diagnosis not present

## 2020-04-25 DIAGNOSIS — K6389 Other specified diseases of intestine: Secondary | ICD-10-CM | POA: Diagnosis not present

## 2020-04-25 NOTE — Progress Notes (Signed)
   Subjective:    Patient ID: Ariel Medina, female    DOB: 1928/06/05, 86 y.o.   MRN: [id number]  HPI  Diarrhea as described on ros. Gradually getting better. No recent antibiotics.  Pt tried immodium AD after she has bm. So about once a day at most.  No constipation that preceded loose stools.  No hx of bowel obstruction. On review of hx no abdomen surgeries.        Review of Systems  Constitutional: Positive for fatigue. Negative for chills and fever.       Mild tired  HENT: Negative for congestion, ear pain, hearing loss, postnasal drip, sinus pressure and sinus pain.   Respiratory: Negative for chest tightness, shortness of breath and wheezing.   Cardiovascular: Negative for chest pain and palpitations.  Gastrointestinal: Positive for abdominal pain and diarrhea. Negative for nausea and vomiting.       Ptin with diarrhea since Sunday night. Pt states. She had 4 loose first night. Gradually getting better. No loose stool on Monday. Thursday and today one loose stool. Small amount of brigh red blood.  Stomach cramps and is slight sore.  Musculoskeletal: Negative for back pain.  Neurological: Negative for dizziness and headaches.  Hematological: Negative for adenopathy. Does not bruise/bleed easily.  Psychiatric/Behavioral: Negative for behavioral problems.       Objective:   Physical Exam  General Appearance- Not in acute distress. Pleasant.  HEENT Eyes- Scleraeral/Conjuntiva-bilat- Not Yellow. Mouth & Throat- Normal.  Chest and Lung Exam Auscultation: Breath sounds:-Normal. Adventitious sounds:- No Adventitious sounds.  Cardiovascular Auscultation:Rythm - Regular. Heart Sounds -Normal heart sounds.  Abdomen Inspection:-Inspection Normal.  Palpation/Perucssion: Palpation and Percussion of the abdomen reveal- Non Tender, No Rebound tenderness, No rigidity(Guarding) and No Palpable abdominal masses.  Liver:-Normal.  Spleen:- Normal.   .      Assessment  & Plan:  You have recent loose stools since Sunday.  It sounds like loose stools are progressively getting better.  Do you still report some mild discomfort and cramping with loose stools.  Recommend hydrate well and eat bland diet.  Avoid greasy and fried foods.  Also avoid any fruit juices.   Recommend not use any immodium in light of the xray findings. Reviewed with pt after hours. Called pt.  Collect stool sample if possible and turn in stool on monday  We will get CBC, CMP and lipase today.  I do want you to go downstairs to the x-ray department and get 1 view abdomen x-ray.  Follow-up with pcp early next week. Pt states has appointment on tuesday  If your signs and symptoms worsen over the weekend then recommend ED evaluation.  Will be likely calling you on Monday morning with results of lab work.  We will try to call sooner if needed.   Reviewed xray with Dr. [name] after hours. We agreed clinically not matching obstruction but if pt has nausea, vomiting, fever, abd pain, or abnormal labs then go to ED this weekend. Pt expressed understanding. I will be looking out for labs and advised pt to listen for phone call in event I need to advise on abnormal labs.   Since clinically stable we did not think ct imaging needed. Also after hours and can't get prior auth if needed. Concern about long waits in ED as many patients are giving me reports of numerous hour waits and presently don't think ED needed.  Mackie Pai, PA-C

## 2020-04-25 NOTE — Patient Instructions (Addendum)
You have recent loose stools since Sunday.  It sounds like loose stools are progressively getting better.  Do you still report some mild discomfort and cramping with loose stools.  Recommend hydrate well and eat bland diet.  Avoid greasy and fried foods.  Also avoid any fruit juices.  Recommend not use any immodium in light of the xray findings. Reviewed with pt after hours. Called pt.  Collect stool sample if possible and turn in stool on monday  We will get CBC, CMP and lipase today.  I do want you to go downstairs to the x-ray department and get 1 view abdomen x-ray.  Follow-up with pcp early next week. Pt states has appointment on tuesday  If your signs and symptoms worsen over the weekend then recommend ED evaluation.  Will be likely calling you on Monday morning with results of lab work.  We will try to call sooner if needed.   Reviewed xray with Dr. [name] after hours. We agreed clinically not matching obstruction but if pt has nausea, vomiting, fever, abd pain, or abnormal labs then go to ED this weekend. Pt expressed understanding. I will be looking out for labs and advised pt to listen for phone call in event I need to advise on abnormal labs.   Since clinically stable we did not think ct imaging needed. Also after hours and can't get prior auth if needed. Concern about long waits in ED as many patients are giving me reports of numerous hour waits and presently don't think ED needed.

## 2020-04-26 LAB — CBC WITH DIFFERENTIAL/PLATELET
Absolute Monocytes: 825 cells/uL (ref 200–950)
Basophils Absolute: 30 cells/uL (ref 0–200)
Basophils Relative: 0.4 %
Eosinophils Absolute: 210 cells/uL (ref 15–500)
Eosinophils Relative: 2.8 %
HCT: 45.9 % — ABNORMAL HIGH (ref 35.0–45.0)
Hemoglobin: 15.6 g/dL — ABNORMAL HIGH (ref 11.7–15.5)
Lymphs Abs: 1245 cells/uL (ref 850–3900)
MCH: 31.1 pg (ref 27.0–33.0)
MCHC: 34 g/dL (ref 32.0–36.0)
MCV: 91.6 fL (ref 80.0–100.0)
MPV: 10 fL (ref 7.5–12.5)
Monocytes Relative: 11 %
Neutro Abs: 5190 cells/uL (ref 1500–7800)
Neutrophils Relative %: 69.2 %
Platelets: 257 10*3/uL (ref 140–400)
RBC: 5.01 10*6/uL (ref 3.80–5.10)
RDW: 12.4 % (ref 11.0–15.0)
Total Lymphocyte: 16.6 %
WBC: 7.5 10*3/uL (ref 3.8–10.8)

## 2020-04-26 LAB — COMPREHENSIVE METABOLIC PANEL
AG Ratio: 1.5 (calc) (ref 1.0–2.5)
ALT: 4 U/L — ABNORMAL LOW (ref 6–29)
AST: 13 U/L (ref 10–35)
Albumin: 3.6 g/dL (ref 3.6–5.1)
Alkaline phosphatase (APISO): 46 U/L (ref 37–153)
BUN: 9 mg/dL (ref 7–25)
CO2: 32 mmol/L (ref 20–32)
Calcium: 9.6 mg/dL (ref 8.6–10.4)
Chloride: 92 mmol/L — ABNORMAL LOW (ref 98–110)
Creat: 0.64 mg/dL (ref 0.60–0.88)
Globulin: 2.4 g/dL (calc) (ref 1.9–3.7)
Glucose, Bld: 95 mg/dL (ref 65–99)
Potassium: 4 mmol/L (ref 3.5–5.3)
Sodium: 135 mmol/L (ref 135–146)
Total Bilirubin: 0.7 mg/dL (ref 0.2–1.2)
Total Protein: 6 g/dL — ABNORMAL LOW (ref 6.1–8.1)

## 2020-04-26 LAB — LIPASE: Lipase: 8 U/L (ref 7–60)

## 2020-04-28 ENCOUNTER — Other Ambulatory Visit: Payer: Self-pay

## 2020-04-28 ENCOUNTER — Other Ambulatory Visit (INDEPENDENT_AMBULATORY_CARE_PROVIDER_SITE_OTHER): Payer: Medicare HMO

## 2020-04-28 DIAGNOSIS — R197 Diarrhea, unspecified: Secondary | ICD-10-CM | POA: Diagnosis not present

## 2020-04-28 NOTE — Addendum Note (Signed)
Addended by: Kelle Darting A on: 04/28/2020 11:25 AM   Modules accepted: Orders

## 2020-04-29 ENCOUNTER — Other Ambulatory Visit: Payer: Self-pay

## 2020-04-29 ENCOUNTER — Encounter: Payer: Self-pay | Admitting: Internal Medicine

## 2020-04-29 ENCOUNTER — Emergency Department (HOSPITAL_BASED_OUTPATIENT_CLINIC_OR_DEPARTMENT_OTHER): Payer: Medicare HMO

## 2020-04-29 ENCOUNTER — Encounter (HOSPITAL_BASED_OUTPATIENT_CLINIC_OR_DEPARTMENT_OTHER): Payer: Self-pay | Admitting: *Deleted

## 2020-04-29 ENCOUNTER — Ambulatory Visit (INDEPENDENT_AMBULATORY_CARE_PROVIDER_SITE_OTHER): Payer: Medicare HMO | Admitting: Internal Medicine

## 2020-04-29 ENCOUNTER — Inpatient Hospital Stay (HOSPITAL_BASED_OUTPATIENT_CLINIC_OR_DEPARTMENT_OTHER)
Admission: EM | Admit: 2020-04-29 | Discharge: 2020-05-07 | DRG: 329 | Disposition: A | Discharge status: Skilled Nursing Facility | Payer: Medicare HMO | Attending: Internal Medicine | Admitting: Internal Medicine

## 2020-04-29 VITALS — BP 142/95 | HR 100 | Temp 98.1°F | Resp 18 | Ht 62.0 in | Wt 121.5 lb

## 2020-04-29 DIAGNOSIS — E785 Hyperlipidemia, unspecified: Secondary | ICD-10-CM | POA: Diagnosis present

## 2020-04-29 DIAGNOSIS — I2699 Other pulmonary embolism without acute cor pulmonale: Secondary | ICD-10-CM

## 2020-04-29 DIAGNOSIS — I82453 Acute embolism and thrombosis of peroneal vein, bilateral: Secondary | ICD-10-CM | POA: Diagnosis not present

## 2020-04-29 DIAGNOSIS — N181 Chronic kidney disease, stage 1: Secondary | ICD-10-CM | POA: Diagnosis present

## 2020-04-29 DIAGNOSIS — R41841 Cognitive communication deficit: Secondary | ICD-10-CM | POA: Diagnosis not present

## 2020-04-29 DIAGNOSIS — K5730 Diverticulosis of large intestine without perforation or abscess without bleeding: Secondary | ICD-10-CM | POA: Diagnosis not present

## 2020-04-29 DIAGNOSIS — K561 Intussusception: Principal | ICD-10-CM

## 2020-04-29 DIAGNOSIS — E039 Hypothyroidism, unspecified: Secondary | ICD-10-CM | POA: Diagnosis present

## 2020-04-29 DIAGNOSIS — Z515 Encounter for palliative care: Secondary | ICD-10-CM | POA: Diagnosis not present

## 2020-04-29 DIAGNOSIS — J9601 Acute respiratory failure with hypoxia: Secondary | ICD-10-CM | POA: Diagnosis not present

## 2020-04-29 DIAGNOSIS — Z48815 Encounter for surgical aftercare following surgery on the digestive system: Secondary | ICD-10-CM | POA: Diagnosis not present

## 2020-04-29 DIAGNOSIS — K922 Gastrointestinal hemorrhage, unspecified: Secondary | ICD-10-CM

## 2020-04-29 DIAGNOSIS — Z66 Do not resuscitate: Secondary | ICD-10-CM | POA: Diagnosis not present

## 2020-04-29 DIAGNOSIS — I82441 Acute embolism and thrombosis of right tibial vein: Secondary | ICD-10-CM | POA: Diagnosis not present

## 2020-04-29 DIAGNOSIS — Z20822 Contact with and (suspected) exposure to covid-19: Secondary | ICD-10-CM | POA: Diagnosis not present

## 2020-04-29 DIAGNOSIS — R918 Other nonspecific abnormal finding of lung field: Secondary | ICD-10-CM | POA: Diagnosis not present

## 2020-04-29 DIAGNOSIS — M6281 Muscle weakness (generalized): Secondary | ICD-10-CM | POA: Diagnosis not present

## 2020-04-29 DIAGNOSIS — M2550 Pain in unspecified joint: Secondary | ICD-10-CM | POA: Diagnosis not present

## 2020-04-29 DIAGNOSIS — R197 Diarrhea, unspecified: Secondary | ICD-10-CM

## 2020-04-29 DIAGNOSIS — K449 Diaphragmatic hernia without obstruction or gangrene: Secondary | ICD-10-CM | POA: Diagnosis not present

## 2020-04-29 DIAGNOSIS — I454 Nonspecific intraventricular block: Secondary | ICD-10-CM | POA: Diagnosis not present

## 2020-04-29 DIAGNOSIS — J069 Acute upper respiratory infection, unspecified: Secondary | ICD-10-CM | POA: Diagnosis not present

## 2020-04-29 DIAGNOSIS — I517 Cardiomegaly: Secondary | ICD-10-CM | POA: Diagnosis not present

## 2020-04-29 DIAGNOSIS — Z01810 Encounter for preprocedural cardiovascular examination: Secondary | ICD-10-CM | POA: Diagnosis not present

## 2020-04-29 DIAGNOSIS — E876 Hypokalemia: Secondary | ICD-10-CM | POA: Diagnosis not present

## 2020-04-29 DIAGNOSIS — J189 Pneumonia, unspecified organism: Secondary | ICD-10-CM | POA: Diagnosis not present

## 2020-04-29 DIAGNOSIS — J309 Allergic rhinitis, unspecified: Secondary | ICD-10-CM | POA: Diagnosis not present

## 2020-04-29 DIAGNOSIS — M8580 Other specified disorders of bone density and structure, unspecified site: Secondary | ICD-10-CM | POA: Diagnosis present

## 2020-04-29 DIAGNOSIS — I69828 Other speech and language deficits following other cerebrovascular disease: Secondary | ICD-10-CM | POA: Diagnosis not present

## 2020-04-29 DIAGNOSIS — I129 Hypertensive chronic kidney disease with stage 1 through stage 4 chronic kidney disease, or unspecified chronic kidney disease: Secondary | ICD-10-CM | POA: Diagnosis present

## 2020-04-29 DIAGNOSIS — Z823 Family history of stroke: Secondary | ICD-10-CM

## 2020-04-29 DIAGNOSIS — Z7401 Bed confinement status: Secondary | ICD-10-CM | POA: Diagnosis not present

## 2020-04-29 DIAGNOSIS — Z8249 Family history of ischemic heart disease and other diseases of the circulatory system: Secondary | ICD-10-CM

## 2020-04-29 DIAGNOSIS — R2681 Unsteadiness on feet: Secondary | ICD-10-CM | POA: Diagnosis not present

## 2020-04-29 DIAGNOSIS — Z7189 Other specified counseling: Secondary | ICD-10-CM | POA: Diagnosis not present

## 2020-04-29 DIAGNOSIS — K921 Melena: Secondary | ICD-10-CM | POA: Diagnosis not present

## 2020-04-29 DIAGNOSIS — R0902 Hypoxemia: Secondary | ICD-10-CM | POA: Diagnosis not present

## 2020-04-29 DIAGNOSIS — R1312 Dysphagia, oropharyngeal phase: Secondary | ICD-10-CM | POA: Diagnosis not present

## 2020-04-29 DIAGNOSIS — Z853 Personal history of malignant neoplasm of breast: Secondary | ICD-10-CM | POA: Diagnosis not present

## 2020-04-29 DIAGNOSIS — Z7982 Long term (current) use of aspirin: Secondary | ICD-10-CM

## 2020-04-29 DIAGNOSIS — M1990 Unspecified osteoarthritis, unspecified site: Secondary | ICD-10-CM | POA: Diagnosis present

## 2020-04-29 DIAGNOSIS — M810 Age-related osteoporosis without current pathological fracture: Secondary | ICD-10-CM | POA: Diagnosis present

## 2020-04-29 DIAGNOSIS — R5381 Other malaise: Secondary | ICD-10-CM | POA: Diagnosis not present

## 2020-04-29 DIAGNOSIS — Z79899 Other long term (current) drug therapy: Secondary | ICD-10-CM

## 2020-04-29 DIAGNOSIS — Z833 Family history of diabetes mellitus: Secondary | ICD-10-CM

## 2020-04-29 DIAGNOSIS — I82431 Acute embolism and thrombosis of right popliteal vein: Secondary | ICD-10-CM | POA: Diagnosis present

## 2020-04-29 DIAGNOSIS — R0602 Shortness of breath: Secondary | ICD-10-CM | POA: Diagnosis not present

## 2020-04-29 DIAGNOSIS — D175 Benign lipomatous neoplasm of intra-abdominal organs: Secondary | ICD-10-CM | POA: Diagnosis not present

## 2020-04-29 DIAGNOSIS — Z79890 Hormone replacement therapy (postmenopausal): Secondary | ICD-10-CM

## 2020-04-29 DIAGNOSIS — I10 Essential (primary) hypertension: Secondary | ICD-10-CM | POA: Diagnosis not present

## 2020-04-29 DIAGNOSIS — Z886 Allergy status to analgesic agent status: Secondary | ICD-10-CM

## 2020-04-29 LAB — SARS CORONAVIRUS 2 BY RT PCR (HOSPITAL ORDER, PERFORMED IN ~~LOC~~ HOSPITAL LAB): SARS Coronavirus 2: NEGATIVE

## 2020-04-29 LAB — COMPREHENSIVE METABOLIC PANEL
ALT: 8 U/L (ref 0–44)
AST: 19 U/L (ref 15–41)
Albumin: 3.3 g/dL — ABNORMAL LOW (ref 3.5–5.0)
Alkaline Phosphatase: 46 U/L (ref 38–126)
Anion gap: 12 (ref 5–15)
BUN: 8 mg/dL (ref 8–23)
CO2: 30 mmol/L (ref 22–32)
Calcium: 9 mg/dL (ref 8.9–10.3)
Chloride: 91 mmol/L — ABNORMAL LOW (ref 98–111)
Creatinine, Ser: 0.66 mg/dL (ref 0.44–1.00)
GFR calc Af Amer: >60 mL/min (ref >60)
GFR calc non Af Amer: >60 mL/min (ref >60)
Glucose, Bld: 101 mg/dL — ABNORMAL HIGH (ref 70–99)
Potassium: 3.3 mmol/L — ABNORMAL LOW (ref 3.5–5.1)
Sodium: 133 mmol/L — ABNORMAL LOW (ref 135–145)
Total Bilirubin: 0.9 mg/dL (ref 0.3–1.2)
Total Protein: 6 g/dL — ABNORMAL LOW (ref 6.5–8.1)

## 2020-04-29 LAB — CBC WITH DIFFERENTIAL/PLATELET
Abs Immature Granulocytes: 0.05 10*3/uL (ref 0.00–0.07)
Basophils Absolute: 0 10*3/uL (ref 0.0–0.1)
Basophils Relative: 0 %
Eosinophils Absolute: 0.1 10*3/uL (ref 0.0–0.5)
Eosinophils Relative: 1 %
HCT: 47.2 % — ABNORMAL HIGH (ref 36.0–46.0)
Hemoglobin: 15.7 g/dL — ABNORMAL HIGH (ref 12.0–15.0)
Immature Granulocytes: 1 %
Lymphocytes Relative: 12 %
Lymphs Abs: 1.1 10*3/uL (ref 0.7–4.0)
MCH: 31 pg (ref 26.0–34.0)
MCHC: 33.3 g/dL (ref 30.0–36.0)
MCV: 93.1 fL (ref 80.0–100.0)
Monocytes Absolute: 0.8 10*3/uL (ref 0.1–1.0)
Monocytes Relative: 9 %
Neutro Abs: 7.3 10*3/uL (ref 1.7–7.7)
Neutrophils Relative %: 77 %
Platelets: 289 10*3/uL (ref 150–400)
RBC: 5.07 MIL/uL (ref 3.87–5.11)
RDW: 12.8 % (ref 11.5–15.5)
WBC: 9.4 10*3/uL (ref 4.0–10.5)
nRBC: 0 % (ref 0.0–0.2)

## 2020-04-29 LAB — HEMOCCULT GUIAC POC 1CARD (OFFICE): Fecal Occult Blood, POC: POSITIVE — AB

## 2020-04-29 LAB — CLOSTRIDIUM DIFFICILE TOXIN B, QUALITATIVE, REAL-TIME PCR: Toxigenic C. Difficile by PCR: NOT DETECTED

## 2020-04-29 LAB — LIPASE, BLOOD: Lipase: 24 U/L (ref 11–51)

## 2020-04-29 LAB — TROPONIN I (HIGH SENSITIVITY)
Troponin I (High Sensitivity): 9 ng/L (ref <18)
Troponin I (High Sensitivity): 9 ng/L (ref <18)

## 2020-04-29 MED ORDER — INFLUENZA VAC A&B SA ADJ QUAD 0.5 ML IM PRSY
0.5000 mL | PREFILLED_SYRINGE | INTRAMUSCULAR | Status: DC | PRN
  Filled 2020-04-29 (×2): qty 0.5

## 2020-04-29 MED ORDER — SODIUM CHLORIDE 0.9 % IV SOLN
500.0000 mg | Freq: Once | INTRAVENOUS | Status: AC
  Administered 2020-04-29: 500 mg via INTRAVENOUS
  Filled 2020-04-29: qty 500

## 2020-04-29 MED ORDER — SODIUM CHLORIDE 0.9 % IV SOLN
500.0000 mg | INTRAVENOUS | Status: AC
  Administered 2020-04-29 – 2020-05-03 (×5): 500 mg via INTRAVENOUS
  Filled 2020-04-29 (×6): qty 500

## 2020-04-29 MED ORDER — SODIUM CHLORIDE 0.9 % IV BOLUS
1000.0000 mL | Freq: Once | INTRAVENOUS | Status: AC
  Administered 2020-04-29: 1000 mL via INTRAVENOUS

## 2020-04-29 MED ORDER — METRONIDAZOLE IN NACL 5-0.79 MG/ML-% IV SOLN
500.0000 mg | Freq: Once | INTRAVENOUS | Status: AC
  Administered 2020-04-29: 500 mg via INTRAVENOUS
  Filled 2020-04-29: qty 100

## 2020-04-29 MED ORDER — POTASSIUM CHLORIDE CRYS ER 20 MEQ PO TBCR
40.0000 meq | EXTENDED_RELEASE_TABLET | Freq: Once | ORAL | Status: AC
  Administered 2020-04-29: 40 meq via ORAL
  Filled 2020-04-29: qty 2

## 2020-04-29 MED ORDER — ONDANSETRON HCL 4 MG/2ML IJ SOLN
4.0000 mg | Freq: Four times a day (QID) | INTRAMUSCULAR | Status: DC | PRN
  Administered 2020-05-01: 4 mg via INTRAVENOUS
  Filled 2020-04-29: qty 2

## 2020-04-29 MED ORDER — ONDANSETRON HCL 4 MG PO TABS: 4.0000 mg | ORAL_TABLET | Freq: Four times a day (QID) | ORAL | Status: DC | PRN

## 2020-04-29 MED ORDER — IOHEXOL 350 MG/ML SOLN
100.0000 mL | Freq: Once | INTRAVENOUS | Status: AC | PRN
  Administered 2020-04-29: 100 mL via INTRAVENOUS

## 2020-04-29 MED ORDER — SODIUM CHLORIDE 0.9 % IV SOLN: Freq: Once | INTRAVENOUS | Status: AC

## 2020-04-29 MED ORDER — SODIUM CHLORIDE 0.9 % IV SOLN
1.0000 g | Freq: Once | INTRAVENOUS | Status: AC
  Administered 2020-04-29: 1 g via INTRAVENOUS
  Filled 2020-04-29: qty 10

## 2020-04-29 MED ORDER — HYDROMORPHONE HCL 1 MG/ML IJ SOLN: 0.5000 mg | INTRAMUSCULAR | Status: DC | PRN

## 2020-04-29 MED ORDER — SODIUM CHLORIDE 0.9 % IV SOLN
1.0000 g | INTRAVENOUS | Status: DC
  Administered 2020-04-29 – 2020-05-03 (×5): 1 g via INTRAVENOUS
  Filled 2020-04-29 (×3): qty 1
  Filled 2020-04-29: qty 10
  Filled 2020-04-29: qty 1
  Filled 2020-04-29: qty 10

## 2020-04-29 MED ORDER — LACTATED RINGERS IV SOLN: INTRAVENOUS | Status: DC

## 2020-04-29 MED ORDER — METRONIDAZOLE IN NACL 5-0.79 MG/ML-% IV SOLN
500.0000 mg | Freq: Three times a day (TID) | INTRAVENOUS | Status: DC
  Administered 2020-04-30 – 2020-05-01 (×4): 500 mg via INTRAVENOUS
  Filled 2020-04-29 (×4): qty 100

## 2020-04-29 NOTE — Patient Instructions (Signed)
Proceed to the emergency room 

## 2020-04-29 NOTE — ED Triage Notes (Signed)
She was seen by her MD this for a recheck bloody diarrhea that she was seen for 4 days ago. She continues to have bloody diarrhea x 2 weeks. She was told to come here for further evaluation.

## 2020-04-29 NOTE — ED Notes (Signed)
Per EDP Pt has had rectal bleeding and is bright red in nature.

## 2020-04-29 NOTE — Assessment & Plan Note (Signed)
Diarrhea, + Hemoccult. Symptoms of started 3 weeks ago, diarrhea is watery according to the patient, occasionally she sees blood. Today on DRE stools noted to be formed, not bloody but Hemoccult positive. Upon arrival heart rate was 122, O2 sat 85% after walking. Heart rate recheck: 102, O2 sat 92% after rest. Labs from 3 days ago reviewed:  no anemia, some hyperchloremia.  C. difficile negative. I am concerned about this 86 year old lady with persistent diarrhea, Hemoccult positive. I think the best option is to go to the ER for prompt work-up (labs, x-rays), decide next steps with results. Appreciate the ER physician who accepted the patient, I am willing to participate in the medical decision making.

## 2020-04-29 NOTE — Progress Notes (Signed)
Subjective:    Patient ID: Ariel Medina, female    DOB: May 09, 1928, 86 y.o.   MRN: [id number]  DOS:  04/29/2020 Type of visit - description: Acute Seen at this office by Saguier PA 04/25/2020: Reported diarrhea, O2 sat 98%, pulse 91, BP 143/73.  CBC showed a normal white count, CMP show a chloride of 92, normal LFTs, C. difficile PCR negative, x-ray of the abdomen, few mildly dilated central left abdominal bowel loops suspicious for mid to distal small bowel obstruction, at the time clinical picture was not obstructive one. Here for follow-up  They report that the symptoms started about 3 weeks ago on and off but for the last 2 weeks the diarrhea has been steady, watery, several BMs daily, occasionally sees blood in the stools. Her appetite is okay but she is afraid to eat solids, she is drinking fluids. She is not dizzy but weaker than usual.   Review of Systems No fever chills No chest pain, difficulty breathing or palpitations No nausea or vomiting + Lower abdominal discomfort. No GERD.  Past Medical History:  Diagnosis Date  . Breast CA ([place])    left breast-invasive ductal ca stage 1-stopped arimidex 2008  . Diverticulitis of colon   . Hiatal hernia   . Hyperlipidemia   . Hypertension   . Hypothyroidism   . Osteoarthritis   . Osteopenia    DEXA 7/02  . Syncope 2005   CT head (-), ECHO essent. neg, stress test (-), carotid u/s (-)    Past Surgical History:  Procedure Laterality Date  . BREAST BIOPSY Bilateral   . BREAST LUMPECTOMY  04/2002   left  . TONSILLECTOMY AND ADENOIDECTOMY      Allergies as of 04/29/2020      Reactions   Ibuprofen    REACTION: bleed      Medication List       Accurate as of April 29, 2020 11:04 AM. If you have any questions, ask your nurse or doctor.        acetaminophen 500 MG tablet Commonly known as: TYLENOL Take 500 mg by mouth every 6 (six) hours as needed for mild pain, moderate pain or headache.   aspirin 81 MG  tablet Take 81 mg by mouth every morning.   Caltrate 600+D 600-400 MG-UNIT tablet Generic drug: Calcium Carbonate-Vitamin D Take 1 tablet by mouth daily.   cyclobenzaprine 5 MG tablet Commonly known as: FLEXERIL Take 1 tablet (5 mg total) by mouth 3 (three) times daily as needed for muscle spasms.   fluticasone 50 MCG/ACT nasal spray Commonly known as: FLONASE Place 2 sprays into both nostrils daily as needed for allergies or rhinitis.   hydrochlorothiazide 25 MG tablet Commonly known as: HYDRODIURIL TAKE 1 TABLET EVERY DAY   ICAPS AREDS 2 PO Take 1 capsule by mouth 2 (two) times daily.   levothyroxine 75 MCG tablet Commonly known as: SYNTHROID Take 1 tablet (75 mcg total) by mouth daily before breakfast.   polyethylene glycol 17 g packet Commonly known as: MIRALAX / GLYCOLAX Take 17 g by mouth daily as needed for moderate constipation.   potassium chloride 10 MEQ tablet Commonly known as: KLOR-CON Take 1 tablet (10 mEq total) by mouth daily.   rosuvastatin 5 MG tablet Commonly known as: CRESTOR Take 1 tablet (5 mg total) by mouth daily.          Objective:   Physical Exam BP (!) 142/95 (BP Location: Right Arm, Patient Position: Sitting, Cuff Size: Small)  Pulse (!) 122   Temp 98.1 F (36.7 C) (Oral)   Resp 18   Ht 5\' 2"  (1.575 m)   Wt 121 lb 8 oz (55.1 kg)   SpO2 (!) 85%   BMI 22.22 kg/m  General:   Well developed, elderly and frail-appearing (near baseline), BMI noted.  HEENT:  Normocephalic . Face symmetric, atraumatic Lungs:  CTA B Normal respiratory effort, no intercostal retractions, no accessory muscle use. Heart: RRR,  no murmur.  Abdomen:  Not distended, soft, non-tender.  Increase bowel sounds   Skin: Not pale. Not jaundice Lower extremities: no pretibial edema bilaterally DRE: Dark Olejniczak stools, Hemoccult positive.  No impaction (assisted by the patient's granddaughter) Neurologic:  alert & oriented X3.  Speech normal, gait tested via  walker Psych--  Cognition and judgment appear intact.  Cooperative with normal attention span and concentration.  Behavior appropriate. No anxious or depressed appearing.     Assessment       Assessment  HTN Hyperlipidemia Hypothyroidism Osteopenia:  --T score -2.1 (04-2012) DC Fosamax 12/2013 after 5 years --T score -1.5   (02-2015)  --T score -3.6 (april 2019) on Prolia DJD GERD  Tremors, likely essential H/o diverticulitis Breast cancer, L breast, release from oncology 2013 Syncope 2005 11-2016: Fall, FX L pubic ramus, admitted then d/c to a NH, back home 12-18-16 Edema, R>L U/S (-)  for DVT 01/2017  PLAN: Diarrhea, + Hemoccult. Symptoms of started 3 weeks ago, diarrhea is watery according to the patient, occasionally she sees blood. Today on DRE stools noted to be formed, not bloody but Hemoccult positive. Upon arrival heart rate was 122, O2 sat 85% after walking. Heart rate recheck: 102, O2 sat 92% after rest. Labs from 3 days ago reviewed:  no anemia, some hyperchloremia.  C. difficile negative. I am concerned about this 86 year old lady with persistent diarrhea, Hemoccult positive. I think the best option is to go to the ER for prompt work-up (labs, x-rays), decide next steps with results. Appreciate the ER physician who accepted the patient, I am willing to participate in the medical decision making.  Time spent with the patient and her granddaughter 30 minutes, discussing reason for ER referral, coordinating her care.  This visit occurred during the SARS-CoV-2 public health emergency.  Safety protocols were in place, including scr eening questions prior to the visit, additional usage of staff PPE, and extensive cleaning of exam room while observing appropriate contact time as indicated for disinfecting solutions.

## 2020-04-29 NOTE — ED Notes (Signed)
Patient refused swab for Covid.  Patient stated that her shortness of breath is nothing new.  She refused the swab with granddaughter, [name], at bedside.  EDP will be notified.

## 2020-04-29 NOTE — Consult Note (Addendum)
Reason for Consult:  intussusception Referring Physician: Earnest Conroy, MD  Ariel Medina is an 86 y.o. female.  HPI: This is a 86 year old female transferred from [facility] today.  She has a history of breast cancer, diverticulitis, hypertension, osteopenia, chronic kidney disease who presented to the ED today with heme positive diarrhea for several weeks and hypoxia.  She underwent thorough work-up which included CT Angio of the chest and CT of the abd/ pelvis.  She was found to have new bilateral PE's.  She also has a mass in the left upper lobe (focal inflammation vs. Neoplasm).  She has intussusception involving the terminal ileum, right and transverse colon.  No signs of ischemia, complete obstruction, or inflammation.  She is being transferred for medical management and surgical evaluation.  According to her PCP, the patient continues to have BM and actually had some formed stool in her rectum today.  According to the notes from her PCP - Dr. [name] and the ED APP, the patient has no abdominal tenderness.  The patient is DNI.  Past Medical History:  Diagnosis Date  . Breast CA ([place])    left breast-invasive ductal ca stage 1-stopped arimidex 2008  . Diverticulitis of colon   . Hiatal hernia   . Hyperlipidemia   . Hypertension   . Hypothyroidism   . Osteoarthritis   . Osteopenia    DEXA 7/02  . Syncope 2005   CT head (-), ECHO essent. neg, stress test (-), carotid u/s (-)    Past Surgical History:  Procedure Laterality Date  . BREAST BIOPSY Bilateral   . BREAST LUMPECTOMY  04/2002   left  . TONSILLECTOMY AND ADENOIDECTOMY      Family History  Problem Relation Age of Onset  . Heart attack Brother 31  . Diabetes Mother   . Stroke Father 7  . Stroke Sister 88  . Colon cancer Neg Hx   . Breast cancer Neg Hx     Social History:  reports that she has never smoked. She has never used smokeless tobacco. She reports that she does not drink alcohol and does not use drugs.  Allergies:   Allergies  Allergen Reactions  . Ibuprofen     REACTION: bleed    Medications:  Prior to Admission medications   Medication Sig Start Date End Date Taking? Authorizing Provider  acetaminophen (TYLENOL) 500 MG tablet Take 500 mg by mouth every 6 (six) hours as needed for mild pain, moderate pain or headache.     [provider]  aspirin 81 MG tablet Take 81 mg by mouth every morning.     [provider]  Calcium Carbonate-Vitamin D (CALTRATE 600+D) 600-400 MG-UNIT tablet Take 1 tablet by mouth daily.    [provider]  cyclobenzaprine (FLEXERIL) 5 MG tablet Take 1 tablet (5 mg total) by mouth 3 (three) times daily as needed for muscle spasms. 12/07/19   Colon Branch, MD  fluticasone [facility]) 50 MCG/ACT nasal spray Place 2 sprays into both nostrils daily as needed for allergies or rhinitis. 08/03/17   Colon Branch, MD  hydrochlorothiazide (HYDRODIURIL) 25 MG tablet TAKE 1 TABLET EVERY DAY 04/03/20   Colon Branch, MD  levothyroxine (SYNTHROID) 75 MCG tablet Take 1 tablet (75 mcg total) by mouth daily before breakfast. 09/12/19   Colon Branch, MD  Multiple Vitamins-Minerals (ICAPS AREDS 2 PO) Take 1 capsule by mouth 2 (two) times daily.    [provider]  polyethylene glycol (MIRALAX /  GLYCOLAX) packet Take 17 g by mouth daily as needed for moderate constipation. Patient not taking: Reported on 04/29/2020 11/30/16   Cristal Ford, DO  potassium chloride (K-DUR,KLOR-CON) 10 MEQ tablet Take 1 tablet (10 mEq total) by mouth daily. 07/07/17   Colon Branch, MD  rosuvastatin (CRESTOR) 5 MG tablet Take 1 tablet (5 mg total) by mouth daily. 12/05/17   Colon Branch, MD     Results for orders placed or performed during the hospital encounter of 04/29/20 (from the past 48 hour(s))  CBC with Differential     Status: Abnormal   Collection Time: 04/29/20 12:04 PM  Result Value Ref Range   WBC 9.4 4.0 - 10.5 K/uL   RBC 5.07 3.87 - 5.11 MIL/uL   Hemoglobin 15.7 (H) 12.0 -  15.0 g/dL   HCT 47.2 (H) 36 - 46 %   MCV 93.1 80.0 - 100.0 fL   MCH 31.0 26.0 - 34.0 pg   MCHC 33.3 30.0 - 36.0 g/dL   RDW 12.8 11.5 - 15.5 %   Platelets 289 150 - 400 K/uL   nRBC 0.0 0.0 - 0.2 %   Neutrophils Relative % 77 %   Neutro Abs 7.3 1.7 - 7.7 K/uL   Lymphocytes Relative 12 %   Lymphs Abs 1.1 0.7 - 4.0 K/uL   Monocytes Relative 9 %   Monocytes Absolute 0.8 0 - 1 K/uL   Eosinophils Relative 1 %   Eosinophils Absolute 0.1 0 - 0 K/uL   Basophils Relative 0 %   Basophils Absolute 0.0 0 - 0 K/uL   Immature Granulocytes 1 %   Abs Immature Granulocytes 0.05 0.00 - 0.07 K/uL    Comment: Performed at [facility], [address] [place], [place] [address]  Comprehensive metabolic panel     Status: Abnormal   Collection Time: 04/29/20 12:04 PM  Result Value Ref Range   Sodium 133 (L) 135 - 145 mmol/L   Potassium 3.3 (L) 3.5 - 5.1 mmol/L   Chloride 91 (L) 98 - 111 mmol/L   CO2 30 22 - 32 mmol/L   Glucose, Bld 101 (H) 70 - 99 mg/dL    Comment: Glucose reference range applies only to samples taken after fasting for at least 8 hours.   BUN 8 8 - 23 mg/dL   Creatinine, Ser 0.66 0.44 - 1.00 mg/dL   Calcium 9.0 8.9 - 10.3 mg/dL   Total Protein 6.0 (L) 6.5 - 8.1 g/dL   Albumin 3.3 (L) 3.5 - 5.0 g/dL   AST 19 15 - 41 U/L   ALT 8 0 - 44 U/L   Alkaline Phosphatase 46 38 - 126 U/L   Total Bilirubin 0.9 0.3 - 1.2 mg/dL   GFR calc non Af Amer >60 >60 mL/min   GFR calc Af Amer >60 >60 mL/min   Anion gap 12 5 - 15    Comment: Performed at [facility], [address] [place], [place] [address]  Lipase, blood     Status: None   Collection Time: 04/29/20 12:04 PM  Result Value Ref Range   Lipase 24 11 - 51 U/L    Comment: Performed at [facility], [address] [place], [place] [address]  Troponin I (High Sensitivity)     Status: None   Collection Time: 04/29/20 12:04 PM  Result Value Ref Range   Troponin I (High Sensitivity) 9 <18 ng/L     Comment: (NOTE)  Elevated high sensitivity troponin I (hsTnI) values and significant  changes across serial measurements may suggest ACS but many other  chronic and acute conditions are known to elevate hsTnI results.  Refer to the "Links" section for chest pain algorithms and additional  guidance. Performed at [facility], [address] [place], [place] [address]   SARS Coronavirus 2 by RT PCR (hospital order, performed in [facility] hospital lab) Nasopharyngeal Nasopharyngeal Swab     Status: None   Collection Time: 04/29/20  3:01 PM   Specimen: Nasopharyngeal Swab  Result Value Ref Range   SARS Coronavirus 2 NEGATIVE NEGATIVE    Comment: (NOTE) SARS-CoV-2 target nucleic acids are NOT DETECTED.  The SARS-CoV-2 RNA is generally detectable in upper and lower respiratory specimens during the acute phase of infection. The lowest concentration of SARS-CoV-2 viral copies this assay can detect is 250 copies / mL. A negative result does not preclude SARS-CoV-2 infection and should not be used as the sole basis for treatment or other patient management decisions.  A negative result may occur with improper specimen collection / handling, submission of specimen other than nasopharyngeal swab, presence of viral mutation(s) within the areas targeted by this assay, and inadequate number of viral copies (<250 copies / mL). A negative result must be combined with clinical observations, patient history, and epidemiological information.  Fact Sheet for Patients:   StrictlyIdeas.no  Fact Sheet for Healthcare Providers: BankingDealers.co.za  This test is not yet approved or  cleared by the [place] FDA and has been authorized for detection and/or diagnosis of SARS-CoV-2 by FDA under an Emergency Use Authorization (EUA).  This EUA will remain in effect (meaning this test can be used) for the duration of the COVID-19 declaration  under Section 564(b)(1) of the Act, 21 U.S.C. section 360bbb-3(b)(1), unless the authorization is terminated or revoked sooner.  Performed at [facility], [address] [place], [place] [address]   Troponin I (High Sensitivity)     Status: None   Collection Time: 04/29/20  3:01 PM  Result Value Ref Range   Troponin I (High Sensitivity) 9 <18 ng/L    Comment: (NOTE) Elevated high sensitivity troponin I (hsTnI) values and significant  changes across serial measurements may suggest ACS but many other  chronic and acute conditions are known to elevate hsTnI results.  Refer to the "Links" section for chest pain algorithms and additional  guidance. Performed at [facility], [address], [place], [place] [address]     CT Angio Chest PE W and/or Wo Contrast  Result Date: 04/29/2020 CLINICAL DATA:  Lower abdominal pain, bloody stools. EXAM: CT ANGIOGRAPHY CHEST CT ABDOMEN AND PELVIS WITH CONTRAST TECHNIQUE: Multidetector CT imaging of the chest was performed using the standard protocol during bolus administration of intravenous contrast. Multiplanar CT image reconstructions and MIPs were obtained to evaluate the vascular anatomy. Multidetector CT imaging of the abdomen and pelvis was performed using the standard protocol during bolus administration of intravenous contrast. CONTRAST:  172mL OMNIPAQUE IOHEXOL 350 MG/ML SOLN COMPARISON:  None. FINDINGS: CTA CHEST FINDINGS Cardiovascular: Filling defects are noted in peripheral branches of both pulmonary arteries consistent with acute pulmonary emboli. Atherosclerosis of thoracic aorta is noted without aneurysm or dissection. Mild cardiomegaly is noted. No pericardial effusion is noted. Mediastinum/Nodes: Large sliding-type hiatal hernia is noted. Thyroid gland is unremarkable. No adenopathy is noted. Lungs/Pleura: No pneumothorax or pleural effusion is noted. Irregular density is noted peripherally in  the left upper lobe with  16 x 11 mm irregular solid density; this may simply represent focal inflammation, but underlying neoplasm cannot be excluded. Follow-up CT scan in 3 weeks is recommended to ensure resolution or stability. Musculoskeletal: No chest wall abnormality. No acute or significant osseous findings. Review of the MIP images confirms the above findings. CT ABDOMEN and PELVIS FINDINGS Hepatobiliary: No focal liver abnormality is seen. No gallstones, gallbladder wall thickening, or biliary dilatation. Pancreas: Unremarkable. No pancreatic ductal dilatation or surrounding inflammatory changes. Spleen: Normal in size without focal abnormality. Adrenals/Urinary Tract: Adrenal glands are unremarkable. Bilateral renal cysts are noted. No hydronephrosis or renal obstruction is noted. No renal or ureteral calculi are noted. Urinary bladder is unremarkable. Stomach/Bowel: There is a large intussusception involving the terminal ileum, right and transverse colon. Possible 19 mm lipoma may be at the lead point. Sigmoid diverticulosis is noted without inflammation. Vascular/Lymphatic: Aortic atherosclerosis. No enlarged abdominal or pelvic lymph nodes. Reproductive: Uterus and bilateral adnexa are unremarkable. Other: No abdominal wall hernia or abnormality. No abdominopelvic ascites. Musculoskeletal: No acute or significant osseous findings. Review of the MIP images confirms the above findings. IMPRESSION: 1. Acute bilateral pulmonary emboli are noted. 2. Irregular density is noted peripherally in the left upper lobe with 16 x 11 mm irregular solid density; this may simply represent focal inflammation, but underlying neoplasm cannot be excluded. Follow-up CT scan in 3 weeks is recommended to ensure resolution or stability. 3. Large intussusception is seen involving the terminal ileum, right and transverse colon. Possible 19 mm lipoma may be at the lead point. 4. Large sliding-type hiatal hernia is noted. 5. Sigmoid diverticulosis without  inflammation. Critical Value/emergent results were called by telephone at the time of interpretation on 04/29/2020 at 3:17 pm to provider [facility] , who verbally acknowledged these results. Aortic Atherosclerosis (ICD10-I70.0). Electronically Signed   By: Marijo Conception M.D.   On: 04/29/2020 15:17   CT ABDOMEN PELVIS W CONTRAST  Result Date: 04/29/2020 CLINICAL DATA:  Lower abdominal pain, bloody stools. EXAM: CT ANGIOGRAPHY CHEST CT ABDOMEN AND PELVIS WITH CONTRAST TECHNIQUE: Multidetector CT imaging of the chest was performed using the standard protocol during bolus administration of intravenous contrast. Multiplanar CT image reconstructions and MIPs were obtained to evaluate the vascular anatomy. Multidetector CT imaging of the abdomen and pelvis was performed using the standard protocol during bolus administration of intravenous contrast. CONTRAST:  1101mL OMNIPAQUE IOHEXOL 350 MG/ML SOLN COMPARISON:  None. FINDINGS: CTA CHEST FINDINGS Cardiovascular: Filling defects are noted in peripheral branches of both pulmonary arteries consistent with acute pulmonary emboli. Atherosclerosis of thoracic aorta is noted without aneurysm or dissection. Mild cardiomegaly is noted. No pericardial effusion is noted. Mediastinum/Nodes: Large sliding-type hiatal hernia is noted. Thyroid gland is unremarkable. No adenopathy is noted. Lungs/Pleura: No pneumothorax or pleural effusion is noted. Irregular density is noted peripherally in the left upper lobe with 16 x 11 mm irregular solid density; this may simply represent focal inflammation, but underlying neoplasm cannot be excluded. Follow-up CT scan in 3 weeks is recommended to ensure resolution or stability. Musculoskeletal: No chest wall abnormality. No acute or significant osseous findings. Review of the MIP images confirms the above findings. CT ABDOMEN and PELVIS FINDINGS Hepatobiliary: No focal liver abnormality is seen. No gallstones, gallbladder wall thickening,  or biliary dilatation. Pancreas: Unremarkable. No pancreatic ductal dilatation or surrounding inflammatory changes. Spleen: Normal in size without focal abnormality. Adrenals/Urinary Tract: Adrenal glands are unremarkable. Bilateral renal cysts are noted. No hydronephrosis or  renal obstruction is noted. No renal or ureteral calculi are noted. Urinary bladder is unremarkable. Stomach/Bowel: There is a large intussusception involving the terminal ileum, right and transverse colon. Possible 19 mm lipoma may be at the lead point. Sigmoid diverticulosis is noted without inflammation. Vascular/Lymphatic: Aortic atherosclerosis. No enlarged abdominal or pelvic lymph nodes. Reproductive: Uterus and bilateral adnexa are unremarkable. Other: No abdominal wall hernia or abnormality. No abdominopelvic ascites. Musculoskeletal: No acute or significant osseous findings. Review of the MIP images confirms the above findings. IMPRESSION: 1. Acute bilateral pulmonary emboli are noted. 2. Irregular density is noted peripherally in the left upper lobe with 16 x 11 mm irregular solid density; this may simply represent focal inflammation, but underlying neoplasm cannot be excluded. Follow-up CT scan in 3 weeks is recommended to ensure resolution or stability. 3. Large intussusception is seen involving the terminal ileum, right and transverse colon. Possible 19 mm lipoma may be at the lead point. 4. Large sliding-type hiatal hernia is noted. 5. Sigmoid diverticulosis without inflammation. Critical Value/emergent results were called by telephone at the time of interpretation on 04/29/2020 at 3:17 pm to provider [facility] , who verbally acknowledged these results. Aortic Atherosclerosis (ICD10-I70.0). Electronically Signed   By: Marijo Conception M.D.   On: 04/29/2020 15:17   DG Chest Portable 1 View  Result Date: 04/29/2020 CLINICAL DATA:  Shortness of breath. EXAM: PORTABLE CHEST 1 VIEW COMPARISON:  05/24/2017 FINDINGS: Large hiatal  hernia, better characterized on priors. Similar lung hyperinflation. Small left pleural effusion versus scar. Bibasilar (left greater than right) opacities. Mild enlargement of the cardiac silhouette, similar to prior. Aortic atherosclerosis. No acute bony abnormality. IMPRESSION: 1. Small left pleural effusion versus scar. Bibasilar opacities may represent atelectasis, although aspiration and/or pneumonia is not excluded. 2. Hyperinflation/COPD. 3. Mild cardiomegaly. Electronically Signed   By: Margaretha Sheffield MD   On: 04/29/2020 12:41    Review of Systems  Constitutional: Negative for fever.  HENT: Negative for ear pain and sore throat.   Eyes: Negative for visual disturbance.  Respiratory: Positive for shortness of breath. Negative for cough.   Cardiovascular: Positive for leg swelling (right leg swelling, chronic for years). Negative for chest pain.  Gastrointestinal: Positive for mild abdominal pain and diarrhea. Negative for constipation, nausea and vomiting.  Genitourinary: Negative for dysuria and hematuria.  Musculoskeletal: Negative for back pain.  Skin: Negative for rash.  Neurological: Negative for headaches.  All other systems reviewed and are negative.  Blood pressure (!) 133/95, pulse 82, temperature 98.3 F (36.8 C), temperature source Rectal, resp. rate (!) 25, height 5\' 2"  (1.575 m), weight 55.1 kg, SpO2 98 %. Physical Exam  Per ED APP Constitutional:  Frail, WDWN in NAD, conversant, no obvious deformities; lying in bed comfortably Eyes:  Pupils equal, round; sclera anicteric; moist conjunctiva; no lid lag HENT:  Oral mucosa moist; good dentition  Neck:  No masses palpated, trachea midline; no thyromegaly Lungs:  CTA bilaterally; normal respiratory effort CV:  Regular rate and rhythm; no murmurs; extremities well-perfused with no edema Abd:  +bowel sounds, soft, non-tender, no palpable organomegaly; no palpable hernias Musc:  Unable to assess gait; no apparent clubbing  or cyanosis in extremities Lymphatic:  No palpable cervical or axillary lymphadenopathy Skin:  Warm, dry; no sign of jaundice Psychiatric - alert and oriented x 4; calm mood and affect  Assessment/Plan: Intussusception of the right and transverse colon - probable mass causing a lead point Patient is not a good surgical candidate due to  age, comorbidities, the new PE's and the undiagnosed lung mass, but if she has bleeding or obstruction, she might need surgical resection regardless.  There does not seem to be any emergent indications for surgery at this time.  We will evaluate the patient further after transfer.    Would recommend engaging Palliative Care early in her hospitalization to establish goals of care.  Would appreciate clarification of her code status.  Patient may have sips of clear liquids while awaiting transfer.  Imogene Burn Abdon Petrosky 04/29/2020, 5:18 PM   I examined the patient after arrival.  No urgent indications for surgery.  Abd - non-tender, but there is a firm palpable mass in the right side of the abdomen.  No peritonitis.    May have some clear liquids for now.  May anticoagulate for PE.  Follow daily labs.  Current hgb is 15.6  Patient will probably need to have surgery this admission, but first need to have Palliative Care discussions and establish goals of care.  Imogene Burn. Georgette Dover, MD, [facility]  General/ Trauma Surgery   04/29/2020 7:47 PM

## 2020-04-29 NOTE — Progress Notes (Signed)
86 year old female presented to PCP with complaints of bloody diarrhea, noted to be tachycardic and hypoxic (88% on room air per report)->directed to [facility] ED where work-up revealed acute PE, possible pneumonia, negative Covid test.  Patient received IV Rocephin and azithromycin. Unable to start heparin for PE given hematochezia witnessed in the ED.  She also underwent CT abdomen/pelvis which revealed intussusception of terminal ileum->case was discussed with GI and general surgery on-call who recommended medical management given advanced age and poor candidate for surgical intervention but final assessment to be done by surgical service when patient arrives to the floor. Per ED physician labs okay except for mild hypokalemia which is being replaced.  Hemoglobin 15.  Stable on 2 L O2.  Blood pressure 130/70.  Heart rate now better-sinus rhythm.  She is DNI.  EDP to discuss with family regarding care goals, CODE STATUS and risk/benefits of initiating heparin in the setting of PE/GI bleed.  If transfer process delayed, patient may need ED to ED transfer for general surgery evaluation.  Patient accepted to stepdown bed as of now.  Admit order placed.

## 2020-04-29 NOTE — Progress Notes (Signed)
Pre visit review using our clinic review tool, if applicable. No additional management support is needed unless otherwise documented below in the visit note. 

## 2020-04-29 NOTE — ED Provider Notes (Signed)
[place] EMERGENCY DEPARTMENT Provider Note   CSN: [id number] Arrival date & time: 04/29/20  1132     History Chief Complaint  Patient presents with  . Diarrhea    Ariel Medina is a 86 y.o. female.  HPI   86 year old female with a history of breast cancer, diverticulitis, hyperlipidemia, hypertension, hypothyroidism, osteoarthritis, osteopenia, CKD, who presents emergency department today for evaluation of diarrhea.  Patient states she is had diarrhea for the last 2 to 3 weeks.  She followed up with her PCP about this on 9/17 and had negative stool cultures and reassuring labs at that time.  She has continued to have symptoms and was seen in the clinic again today and had a positive Hemoccult test.  Patient does report she has had some bright red stools.  She denies any melena.  She has some intermittent diffuse abdominal pain.  She denies any associated nausea, vomiting, fevers, urinary symptoms.  Reviewed PCP note, patient was noted to be hypoxic in the office and was also tachycardic.  She denies that she has had a cough or chest pain but she does report that she has had some shortness of breath.  Her granddaughter is at bedside and states that the patient chronically has shortness of breath the patient feels that it is worse over the last few weeks since the diarrhea started.  She denies any known Covid exposures and states she has been fully vaccinated against Covid.  Past Medical History:  Diagnosis Date  . Breast CA ([place])    left breast-invasive ductal ca stage 1-stopped arimidex 2008  . Diverticulitis of colon   . Hiatal hernia   . Hyperlipidemia   . Hypertension   . Hypothyroidism   . Osteoarthritis   . Osteopenia    DEXA 7/02  . Syncope 2005   CT head (-), ECHO essent. neg, stress test (-), carotid u/s (-)    Patient Active Problem List   Diagnosis Date Noted  . Intussusception ([place]) 04/29/2020  . Pre-ulcerative calluses 01/25/2020  . Essential  hypertension 11/29/2016  . Closed fracture of multiple pubic rami ([place]) 11/29/2016  . Fall   . Closed fracture of left pubis ([place]) 11/28/2016  . Hypokalemia 11/28/2016  . Hearing loss 09/19/2015  . PCP NOTES >>> 05/13/2015  . Allergic rhinitis 10/24/2014  . Annual physical exam 04/14/2011  . BACK PAIN, shoulder pain 12/11/2007  . Osteoporosis 10/20/2007  . NEOPLASM, MALIGNANT, BREAST, HX OF 10/20/2007  . Hypothyroidism 01/30/2007  . Hyperlipidemia 01/30/2007  . HTN and mild edema 01/30/2007  . OSTEOARTHRITIS 01/30/2007    Past Surgical History:  Procedure Laterality Date  . BREAST BIOPSY Bilateral   . BREAST LUMPECTOMY  04/2002   left  . TONSILLECTOMY AND ADENOIDECTOMY       OB History   No obstetric history on file.     Family History  Problem Relation Age of Onset  . Heart attack Brother 3  . Diabetes Mother   . Stroke Father 53  . Stroke Sister 67  . Colon cancer Neg Hx   . Breast cancer Neg Hx     Social History   Tobacco Use  . Smoking status: Never Smoker  . Smokeless tobacco: Never Used  Vaping Use  . Vaping Use: Never used  Substance Use Topics  . Alcohol use: No  . Drug use: No    Home Medications Prior to Admission medications   Medication Sig Start Date End Date Taking? Authorizing Provider  acetaminophen (  TYLENOL) 500 MG tablet Take 500 mg by mouth every 6 (six) hours as needed for mild pain, moderate pain or headache.     [provider]  aspirin 81 MG tablet Take 81 mg by mouth every morning.     [provider]  Calcium Carbonate-Vitamin D (CALTRATE 600+D) 600-400 MG-UNIT tablet Take 1 tablet by mouth daily.    [provider]  cyclobenzaprine (FLEXERIL) 5 MG tablet Take 1 tablet (5 mg total) by mouth 3 (three) times daily as needed for muscle spasms. 12/07/19   Colon Branch, MD  fluticasone [facility]) 50 MCG/ACT nasal spray Place 2 sprays into both nostrils daily as needed for allergies or rhinitis. 08/03/17   Colon Branch, MD  hydrochlorothiazide (HYDRODIURIL) 25 MG tablet TAKE 1 TABLET EVERY DAY 04/03/20   Colon Branch, MD  levothyroxine (SYNTHROID) 75 MCG tablet Take 1 tablet (75 mcg total) by mouth daily before breakfast. 09/12/19   Colon Branch, MD  Multiple Vitamins-Minerals (ICAPS AREDS 2 PO) Take 1 capsule by mouth 2 (two) times daily.    [provider]  polyethylene glycol (MIRALAX / GLYCOLAX) packet Take 17 g by mouth daily as needed for moderate constipation. Patient not taking: Reported on 04/29/2020 11/30/16   Cristal Ford, DO  potassium chloride (K-DUR,KLOR-CON) 10 MEQ tablet Take 1 tablet (10 mEq total) by mouth daily. 07/07/17   Colon Branch, MD  rosuvastatin (CRESTOR) 5 MG tablet Take 1 tablet (5 mg total) by mouth daily. 12/05/17   Colon Branch, MD    Allergies    Ibuprofen  Review of Systems   Review of Systems  Constitutional: Negative for fever.  HENT: Negative for ear pain and sore throat.   Eyes: Negative for visual disturbance.  Respiratory: Positive for shortness of breath. Negative for cough.   Cardiovascular: Positive for leg swelling (right leg swelling, chronic for years). Negative for chest pain.  Gastrointestinal: Positive for abdominal pain and diarrhea. Negative for constipation, nausea and vomiting.  Genitourinary: Negative for dysuria and hematuria.  Musculoskeletal: Negative for back pain.  Skin: Negative for rash.  Neurological: Negative for headaches.  All other systems reviewed and are negative.   Physical Exam Updated Vital Signs BP (!) 126/99   Pulse 85   Temp 98.3 F (36.8 C) (Rectal)   Resp 16   Ht 5\' 2"  (1.575 m)   Wt 55.1 kg   SpO2 99%   BMI 22.22 kg/m   Physical Exam Vitals and nursing note reviewed.  Constitutional:      General: She is not in acute distress.    Appearance: She is well-developed.  HENT:     Head: Normocephalic and atraumatic.     Mouth/Throat:     Mouth: Mucous membranes are dry.  Eyes:     Conjunctiva/sclera:  Conjunctivae normal.  Cardiovascular:     Rate and Rhythm: Normal rate and regular rhythm.     Heart sounds: Normal heart sounds. No murmur heard.   Pulmonary:     Effort: Pulmonary effort is normal. No respiratory distress.     Breath sounds: Wheezing present.  Abdominal:     General: Bowel sounds are normal.     Palpations: Abdomen is soft.     Tenderness: There is no abdominal tenderness. There is no guarding or rebound.  Musculoskeletal:     Cervical back: Neck supple.     Comments: RLE swelling without overt edema. No calf TTP. (chronic per patient)  Skin:  General: Skin is warm and dry.  Neurological:     Mental Status: She is alert.     ED Results / Procedures / Treatments   Labs (all labs ordered are listed, but only abnormal results are displayed) Labs Reviewed  CBC WITH DIFFERENTIAL/PLATELET - Abnormal; Notable for the following components:      Result Value   Hemoglobin 15.7 (*)    HCT 47.2 (*)    All other components within normal limits  COMPREHENSIVE METABOLIC PANEL - Abnormal; Notable for the following components:   Sodium 133 (*)    Potassium 3.3 (*)    Chloride 91 (*)    Glucose, Bld 101 (*)    Total Protein 6.0 (*)    Albumin 3.3 (*)    All other components within normal limits  SARS CORONAVIRUS 2 BY RT PCR (HOSPITAL ORDER, [facility])  LIPASE, BLOOD  TROPONIN I (HIGH SENSITIVITY)  TROPONIN I (HIGH SENSITIVITY)    EKG EKG Interpretation  Date/Time:  Tuesday April 29 2020 12:47:57 EDT Ventricular Rate:  74 PR Interval:    QRS Duration: 99 QT Interval:  395 QTC Calculation: 453 R Axis:   -15 Text Interpretation: Sinus rhythm Prolonged PR interval Left atrial enlargement Borderline left axis deviation RSR' in V1 or V2, probably normal variant Abnormal T, consider ischemia, diffuse leads , new since last tracing Confirmed by Dorie Rank [phone]) on 04/29/2020 12:57:42 PM   Radiology CT Angio Chest PE W and/or Wo  Contrast  Result Date: 04/29/2020 CLINICAL DATA:  Lower abdominal pain, bloody stools. EXAM: CT ANGIOGRAPHY CHEST CT ABDOMEN AND PELVIS WITH CONTRAST TECHNIQUE: Multidetector CT imaging of the chest was performed using the standard protocol during bolus administration of intravenous contrast. Multiplanar CT image reconstructions and MIPs were obtained to evaluate the vascular anatomy. Multidetector CT imaging of the abdomen and pelvis was performed using the standard protocol during bolus administration of intravenous contrast. CONTRAST:  150mL OMNIPAQUE IOHEXOL 350 MG/ML SOLN COMPARISON:  None. FINDINGS: CTA CHEST FINDINGS Cardiovascular: Filling defects are noted in peripheral branches of both pulmonary arteries consistent with acute pulmonary emboli. Atherosclerosis of thoracic aorta is noted without aneurysm or dissection. Mild cardiomegaly is noted. No pericardial effusion is noted. Mediastinum/Nodes: Large sliding-type hiatal hernia is noted. Thyroid gland is unremarkable. No adenopathy is noted. Lungs/Pleura: No pneumothorax or pleural effusion is noted. Irregular density is noted peripherally in the left upper lobe with 16 x 11 mm irregular solid density; this may simply represent focal inflammation, but underlying neoplasm cannot be excluded. Follow-up CT scan in 3 weeks is recommended to ensure resolution or stability. Musculoskeletal: No chest wall abnormality. No acute or significant osseous findings. Review of the MIP images confirms the above findings. CT ABDOMEN and PELVIS FINDINGS Hepatobiliary: No focal liver abnormality is seen. No gallstones, gallbladder wall thickening, or biliary dilatation. Pancreas: Unremarkable. No pancreatic ductal dilatation or surrounding inflammatory changes. Spleen: Normal in size without focal abnormality. Adrenals/Urinary Tract: Adrenal glands are unremarkable. Bilateral renal cysts are noted. No hydronephrosis or renal obstruction is noted. No renal or ureteral  calculi are noted. Urinary bladder is unremarkable. Stomach/Bowel: There is a large intussusception involving the terminal ileum, right and transverse colon. Possible 19 mm lipoma may be at the lead point. Sigmoid diverticulosis is noted without inflammation. Vascular/Lymphatic: Aortic atherosclerosis. No enlarged abdominal or pelvic lymph nodes. Reproductive: Uterus and bilateral adnexa are unremarkable. Other: No abdominal wall hernia or abnormality. No abdominopelvic ascites. Musculoskeletal: No acute or significant osseous  findings. Review of the MIP images confirms the above findings. IMPRESSION: 1. Acute bilateral pulmonary emboli are noted. 2. Irregular density is noted peripherally in the left upper lobe with 16 x 11 mm irregular solid density; this may simply represent focal inflammation, but underlying neoplasm cannot be excluded. Follow-up CT scan in 3 weeks is recommended to ensure resolution or stability. 3. Large intussusception is seen involving the terminal ileum, right and transverse colon. Possible 19 mm lipoma may be at the lead point. 4. Large sliding-type hiatal hernia is noted. 5. Sigmoid diverticulosis without inflammation. Critical Value/emergent results were called by telephone at the time of interpretation on 04/29/2020 at 3:17 pm to provider [facility] , who verbally acknowledged these results. Aortic Atherosclerosis (ICD10-I70.0). Electronically Signed   By: Marijo Conception M.D.   On: 04/29/2020 15:17   CT ABDOMEN PELVIS W CONTRAST  Result Date: 04/29/2020 CLINICAL DATA:  Lower abdominal pain, bloody stools. EXAM: CT ANGIOGRAPHY CHEST CT ABDOMEN AND PELVIS WITH CONTRAST TECHNIQUE: Multidetector CT imaging of the chest was performed using the standard protocol during bolus administration of intravenous contrast. Multiplanar CT image reconstructions and MIPs were obtained to evaluate the vascular anatomy. Multidetector CT imaging of the abdomen and pelvis was performed using the  standard protocol during bolus administration of intravenous contrast. CONTRAST:  181mL OMNIPAQUE IOHEXOL 350 MG/ML SOLN COMPARISON:  None. FINDINGS: CTA CHEST FINDINGS Cardiovascular: Filling defects are noted in peripheral branches of both pulmonary arteries consistent with acute pulmonary emboli. Atherosclerosis of thoracic aorta is noted without aneurysm or dissection. Mild cardiomegaly is noted. No pericardial effusion is noted. Mediastinum/Nodes: Large sliding-type hiatal hernia is noted. Thyroid gland is unremarkable. No adenopathy is noted. Lungs/Pleura: No pneumothorax or pleural effusion is noted. Irregular density is noted peripherally in the left upper lobe with 16 x 11 mm irregular solid density; this may simply represent focal inflammation, but underlying neoplasm cannot be excluded. Follow-up CT scan in 3 weeks is recommended to ensure resolution or stability. Musculoskeletal: No chest wall abnormality. No acute or significant osseous findings. Review of the MIP images confirms the above findings. CT ABDOMEN and PELVIS FINDINGS Hepatobiliary: No focal liver abnormality is seen. No gallstones, gallbladder wall thickening, or biliary dilatation. Pancreas: Unremarkable. No pancreatic ductal dilatation or surrounding inflammatory changes. Spleen: Normal in size without focal abnormality. Adrenals/Urinary Tract: Adrenal glands are unremarkable. Bilateral renal cysts are noted. No hydronephrosis or renal obstruction is noted. No renal or ureteral calculi are noted. Urinary bladder is unremarkable. Stomach/Bowel: There is a large intussusception involving the terminal ileum, right and transverse colon. Possible 19 mm lipoma may be at the lead point. Sigmoid diverticulosis is noted without inflammation. Vascular/Lymphatic: Aortic atherosclerosis. No enlarged abdominal or pelvic lymph nodes. Reproductive: Uterus and bilateral adnexa are unremarkable. Other: No abdominal wall hernia or abnormality. No  abdominopelvic ascites. Musculoskeletal: No acute or significant osseous findings. Review of the MIP images confirms the above findings. IMPRESSION: 1. Acute bilateral pulmonary emboli are noted. 2. Irregular density is noted peripherally in the left upper lobe with 16 x 11 mm irregular solid density; this may simply represent focal inflammation, but underlying neoplasm cannot be excluded. Follow-up CT scan in 3 weeks is recommended to ensure resolution or stability. 3. Large intussusception is seen involving the terminal ileum, right and transverse colon. Possible 19 mm lipoma may be at the lead point. 4. Large sliding-type hiatal hernia is noted. 5. Sigmoid diverticulosis without inflammation. Critical Value/emergent results were called by telephone at the time of interpretation  on 04/29/2020 at 3:17 pm to provider [facility] , who verbally acknowledged these results. Aortic Atherosclerosis (ICD10-I70.0). Electronically Signed   By: Marijo Conception M.D.   On: 04/29/2020 15:17   DG Chest Portable 1 View  Result Date: 04/29/2020 CLINICAL DATA:  Shortness of breath. EXAM: PORTABLE CHEST 1 VIEW COMPARISON:  05/24/2017 FINDINGS: Large hiatal hernia, better characterized on priors. Similar lung hyperinflation. Small left pleural effusion versus scar. Bibasilar (left greater than right) opacities. Mild enlargement of the cardiac silhouette, similar to prior. Aortic atherosclerosis. No acute bony abnormality. IMPRESSION: 1. Small left pleural effusion versus scar. Bibasilar opacities may represent atelectasis, although aspiration and/or pneumonia is not excluded. 2. Hyperinflation/COPD. 3. Mild cardiomegaly. Electronically Signed   By: Margaretha Sheffield MD   On: 04/29/2020 12:41    Procedures Procedures (including critical care time)  CRITICAL CARE Performed by: Rodney Booze   Total critical care time: 40 minutes  Critical care time was exclusive of separately billable procedures and treating other  patients.  Critical care was necessary to treat or prevent imminent or life-threatening deterioration.  Critical care was time spent personally by me on the following activities: development of treatment plan with patient and/or surrogate as well as nursing, discussions with consultants, evaluation of patient's response to treatment, examination of patient, obtaining history from patient or surrogate, ordering and performing treatments and interventions, ordering and review of laboratory studies, ordering and review of radiographic studies, pulse oximetry and re-evaluation of patient's condition.   Medications Ordered in ED Medications  sodium chloride 0.9 % bolus 1,000 mL (0 mLs Intravenous Stopped 04/29/20 1411)  potassium chloride SA (KLOR-CON) CR tablet 40 mEq (40 mEq Oral Given 04/29/20 1348)  iohexol (OMNIPAQUE) 350 MG/ML injection 100 mL (100 mLs Intravenous Contrast Given 04/29/20 1442)  cefTRIAXone (ROCEPHIN) 1 g in sodium chloride 0.9 % 100 mL IVPB (0 g Intravenous Stopped 04/29/20 1626)  azithromycin (ZITHROMAX) 500 mg in sodium chloride 0.9 % 250 mL IVPB (0 mg Intravenous Stopped 04/29/20 1757)  0.9 %  sodium chloride infusion ( Intravenous Stopped 04/29/20 1904)  metroNIDAZOLE (FLAGYL) IVPB 500 mg (0 mg Intravenous Stopped 04/29/20 1904)    ED Course  I have reviewed the triage vital signs and the nursing notes.  Pertinent labs & imaging results that were available during my care of the patient were reviewed by me and considered in my medical decision making (see chart for details).    MDM Rules/Calculators/A&P                          86 year old female presenting for eval of diarrhea x2 weeks.  Found to be hypoxic at PCP office prior to arrival and sent here for further evaluation after having positive Hemoccult at PCP office  On arrival sats at 86%, placed on 2 L and sats increased to 95 to 100%.  Tachycardia resolved and vital signs otherwise reassuring  Reviewed/interpreted  labs CBC without leukocytosis, hemoglobin elevated likely due to hemoconcentration CMP with mild hypokalemia, otherwise normal kidney and liver function Lipase negative Delta troponins negative Covid negative  EKG Sinus rhythm Prolonged PR interval Left atrial enlargement Borderline left axis deviation RSR' in V1 or V2, probably normal variant Abnormal T, consider ischemia, diffuse leads , new since last tracing   CXR 1. Small left pleural effusion versus scar. Bibasilar opacities may represent atelectasis, although aspiration and/or pneumonia is not excluded. 2. Hyperinflation/COPD. 3. Mild cardiomegaly.  CT chest/abd/pelvis - 1. Acute  bilateral pulmonary emboli are noted. 2. Irregular density is noted peripherally in the left upper lobe with 16 x 11 mm irregular solid density; this may simply represent focal inflammation, but underlying neoplasm cannot be excluded. Follow-up CT scan in 3 weeks is recommended to ensure resolution or stability. 3. Large intussusception is seen involving the terminal ileum, right and transverse colon. Possible 19 mm lipoma may be at the lead point. 4. Large sliding-type hiatal hernia is noted. 5. Sigmoid diverticulosis without inflammation. Critical Value/emergent results were called by telephone at the time of interpretation on 04/29/2020 at 3:17 pm to provider [facility] , who verbally acknowledged these results. Aortic Atherosclerosis   Patient started on antibiotics for community-acquired pneumonia.  Flagyl was also added due to concern for possible intra-abdominal infection as well per hospitalist request.  3:59 PM CONSULT with Nicoletta Ba, PA with [facility] gastroenterology. She states this is a surgical issue and gen surg consult recommended.  4:04 PM CONSULT with Lowell Guitar, PA with general surgery. She recommends medical admit and surgery will eval pt tonight.   5:00 PM CONSULT with Dr. Earnest Conroy with hospitalist service who accepts patient at [facility],  however recommends further discussion with surgery to discuss ED-ED transfer given patient will require surgical evaluation tonight.  Discussed case with Dr. Barry Dienes. She is requesting that pt be transferred to [facility] rather than [facility] due to their team's prior discussion with gen surg PA. Dr. Earnest Conroy was updated. [facility] stated that hospitalist will need to be consulted on patient arrival for bed assignment/placement.   Prior to transfer, had long discussion with family about anticoagulation in setting of ongoing bleeding. They are in agreement to hold on heparin at this time. They state pt is DNI but would want to have all other forms of resuscitation if necessary.  Final Clinical Impression(s) / ED Diagnoses Final diagnoses:  Acute pulmonary embolism, unspecified pulmonary embolism type, unspecified whether acute cor pulmonale present ([place])  Intussusception [facility])    Rx / DC Orders ED Discharge Orders    None       Bishop Dublin 04/29/20 1904    Truddie Hidden, MD 04/29/20 2309

## 2020-04-29 NOTE — H&P (Signed)
History and Physical    Ariel Medina AUQ:[id number] DOB: 26-Aug-1927 DOA: 04/29/2020  PCP: Colon Branch, MD   Patient coming from: Home via [place] emergency room  Chief Complaint: Intermittent abdominal pain with diarrhea, generalized weakness, shortness of breath  HPI: Ariel Medina is a 86 y.o. female with medical history significant for history of breast cancer, diverticulitis, hyperlipidemia, hypertension, hypothyroidism, osteoarthritis, osteopenia, CKD, who presents emergency department today for evaluation of diarrhea.  She reports that for the last 2 weeks she has been having diarrhea that is worsened in the last week.  She also reports having bright red blood in her stool for the last week.  She states that during this time she has had intermittent abdominal pain and cramping.  Reports she has had decreased appetite and has had very little p.o. intake for the last few days.  States he has not been able to take her medicines last couple days secondary to nausea and decreased appetite and not eating.  Said she has not had any coffee-ground emesis or vomiting.  States he has not had any black tarry stools.  She was seen a few days ago in her PCP office for evaluation and had negative stool cultures and labs at that time.  She had follow-up with her PCP today and was noted to have Hemoccult positive stool.  He was also noted to be hypoxic letter PCP office and tachycardic so she was sent to the emergency room for evaluation.  She denies any cough or chest pain.  She states she has not had any fever.  She does report having some shortness of breath with exertion over the past few days.  She has not had any known COVID-19 exposures and she has been fully vaccinated against COVID-19.  She states she has not had any sick contacts at home. She denies tobacco, alcohol, illicit drug use. Is noted in her chart that she is a DNI CODE STATUS.  I had a discussion with patient about her CODE  STATUS and she states she does not want to "live on a ventilator long-term" but she would want to be resuscitated if her heart were to stop or she were to stop breathing.  She specifically states she would want to be intubated and try to be treated and brought back to her normal state of health if that was required.  ED Course: [name] was found in the emergency room to have bilateral PEs on CT angiography of her chest.  She also had a left upper lobe opacity which could represent a focal inflammation versus a underlying neoplasm.  She was started on empiric antibiotics for possible pneumonia.  She was also noted to have hematochezia and therefore was not placed on anticoagulation.  CT of her abdomen revealed intussusception of her terminal ileum and right-sided colon.  General surgery and GI with consulted by the emergency room physician and they recommended transfer to [facility] and general surgery will evaluate when she arrives here.  Hospitalist service has been asked to admit patient for further management  Review of Systems:  General: Reports generalized weakness.denies fever, chills, weight loss, night sweats.  Denies dizziness.  Reports decreased appetite HENT: Denies head trauma, headache, denies change in hearing, tinnitus.  Denies nasal congestion or bleeding.  Denies sore throat, sores in mouth.  Denies difficulty swallowing Eyes: Denies blurry vision, pain in eye, drainage.  Denies discoloration of eyes. Neck: Denies pain.  Denies swelling.  Denies pain with movement. Cardiovascular: Denies chest pain, palpitations.  Denies edema.  Denies orthopnea Respiratory: Reports shortness of breath without cough.  Denies wheezing.  Denies sputum production Gastrointestinal: Reports intermittent abdominal pain past week.  Reports diarrhea for the last week.  Reports blood in stool.  No swelling.  Denies nausea, vomiting.  Denies melena.  Denies hematemesis. Musculoskeletal: Denies  limitation of movement.  Denies deformity or swelling.  Denies pain.  Denies arthralgias or myalgias. Genitourinary: Denies pelvic pain.  Denies urinary frequency or hesitancy.  Denies dysuria.  Skin: Denies rash.  Denies petechiae, purpura, ecchymosis. Neurological: Denies headache.  Denies syncope.  Denies seizure activity.  Denies weakness or paresthesia.  Denies slurred speech, drooping face.  Denies visual change. Psychiatric: Denies depression, anxiety.  Denies suicidal thoughts or ideation.  Denies hallucinations.  Past Medical History:  Diagnosis Date  . Breast CA ([place])    left breast-invasive ductal ca stage 1-stopped arimidex 2008  . Diverticulitis of colon   . Hiatal hernia   . Hyperlipidemia   . Hypertension   . Hypothyroidism   . Osteoarthritis   . Osteopenia    DEXA 7/02  . Syncope 2005   CT head (-), ECHO essent. neg, stress test (-), carotid u/s (-)    Past Surgical History:  Procedure Laterality Date  . BREAST BIOPSY Bilateral   . BREAST LUMPECTOMY  04/2002   left  . TONSILLECTOMY AND ADENOIDECTOMY      Social History  reports that she has never smoked. She has never used smokeless tobacco. She reports that she does not drink alcohol and does not use drugs.  Allergies  Allergen Reactions  . Ibuprofen     REACTION: bleed    Family History  Problem Relation Age of Onset  . Heart attack Brother 38  . Diabetes Mother   . Stroke Father 35  . Stroke Sister 49  . Colon cancer Neg Hx   . Breast cancer Neg Hx      Prior to Admission medications   Medication Sig Start Date End Date Taking? Authorizing Provider  acetaminophen (TYLENOL) 500 MG tablet Take 500 mg by mouth every 6 (six) hours as needed for mild pain, moderate pain or headache.     [provider]  aspirin 81 MG tablet Take 81 mg by mouth every morning.     [provider]  Calcium Carbonate-Vitamin D (CALTRATE 600+D) 600-400 MG-UNIT tablet Take 1 tablet by mouth daily.     [provider]  cyclobenzaprine (FLEXERIL) 5 MG tablet Take 1 tablet (5 mg total) by mouth 3 (three) times daily as needed for muscle spasms. 12/07/19   Colon Branch, MD  fluticasone [facility]) 50 MCG/ACT nasal spray Place 2 sprays into both nostrils daily as needed for allergies or rhinitis. 08/03/17   Colon Branch, MD  hydrochlorothiazide (HYDRODIURIL) 25 MG tablet TAKE 1 TABLET EVERY DAY 04/03/20   Colon Branch, MD  levothyroxine (SYNTHROID) 75 MCG tablet Take 1 tablet (75 mcg total) by mouth daily before breakfast. 09/12/19   Colon Branch, MD  Multiple Vitamins-Minerals (ICAPS AREDS 2 PO) Take 1 capsule by mouth 2 (two) times daily.    [provider]  polyethylene glycol (MIRALAX / GLYCOLAX) packet Take 17 g by mouth daily as needed for moderate constipation. Patient not taking: Reported on 04/29/2020 11/30/16   Cristal Ford, DO  potassium chloride (K-DUR,KLOR-CON) 10 MEQ tablet Take 1 tablet (10 mEq total) by mouth daily. 07/07/17  Colon Branch, MD  rosuvastatin (CRESTOR) 5 MG tablet Take 1 tablet (5 mg total) by mouth daily. 12/05/17   Colon Branch, MD    Physical Exam: Vitals:   04/29/20 1730 04/29/20 1926 04/29/20 1930 04/29/20 2000  BP: (!) 126/99  (!) 158/91 (!) 147/82  Pulse: 85 88 86 81  Resp: 16 19 19  (!) 24  Temp:      TempSrc:      SpO2: 99% 100% 100% 100%  Weight:      Height:        Constitutional: NAD, calm, comfortable Vitals:   04/29/20 1730 04/29/20 1926 04/29/20 1930 04/29/20 2000  BP: (!) 126/99  (!) 158/91 (!) 147/82  Pulse: 85 88 86 81  Resp: 16 19 19  (!) 24  Temp:      TempSrc:      SpO2: 99% 100% 100% 100%  Weight:      Height:       General: WDWN, pleasant elderly female.  Alert and oriented x3.  Eyes: EOMI, PERRL, lids and conjunctivae normal.  Sclera nonicteric HENT:  [place]/AT, external ears normal.  Nares patent without epistasis.  Mucous membranes are moist. Posterior pharynx clear of any exudate or lesions. Neck: Soft, normal range of  motion, supple, no masses, no thyromegaly.  Trachea midline Respiratory: clear to auscultation bilaterally.  No rhonchi.  No wheezing, no crackles. Normal respiratory effort. No accessory muscle use.  Cardiovascular: Regular rate and rhythm, no murmurs / rubs / gallops. No extremity edema. 2+ pedal pulses.   Abdomen: Soft, no tenderness, nondistended, no rebound or guarding.  No masses palpated. No hepatosplenomegaly. Bowel sounds hyperactive.   Musculoskeletal: FROM.  Lower extremities mildly tender palpation.  No calf tenderness.  Negative Homans' sign.  No clubbing / cyanosis. No joint deformity upper and lower extremities. Normal muscle tone.  Skin: Warm, dry, intact no rashes, lesions, ulcers. No induration Neurologic: CN 2-12 grossly intact.  Normal speech.  Sensation intact, patella DTR +1 bilaterally. Strength 5/5 in all extremities.   Psychiatric: Normal judgment and insight.  Normal mood.    Labs on Admission: I have personally reviewed following labs and imaging studies  CBC: Recent Labs  Lab 04/25/20 1620 04/29/20 1204  WBC 7.5 9.4  NEUTROABS 5,190 7.3  HGB 15.6* 15.7*  HCT 45.9* 47.2*  MCV 91.6 93.1  PLT 257 710    Basic Metabolic Panel: Recent Labs  Lab 04/25/20 1620 04/29/20 1204  NA 135 133*  K 4.0 3.3*  CL 92* 91*  CO2 32 30  GLUCOSE 95 101*  BUN 9 8  CREATININE 0.64 0.66  CALCIUM 9.6 9.0    GFR: Estimated Creatinine Clearance: 37 mL/min (by C-G formula based on SCr of 0.66 mg/dL).  Liver Function Tests: Recent Labs  Lab 04/25/20 1620 04/29/20 1204  AST 13 19  ALT 4* 8  ALKPHOS  --  46  BILITOT 0.7 0.9  PROT 6.0* 6.0*  ALBUMIN  --  3.3*    Urine analysis:    Component Value Date/Time   BILIRUBINUR 1 12/26/2014 1628   PROTEINUR 100 12/26/2014 1628   UROBILINOGEN 0.2 12/26/2014 1628   NITRITE Neg 12/26/2014 1628   LEUKOCYTESUR Trace 12/26/2014 1628    Radiological Exams on Admission: CT Angio Chest PE W and/or Wo Contrast  Result  Date: 04/29/2020 CLINICAL DATA:  Lower abdominal pain, bloody stools. EXAM: CT ANGIOGRAPHY CHEST CT ABDOMEN AND PELVIS WITH CONTRAST TECHNIQUE: Multidetector CT imaging of the chest was performed using  the standard protocol during bolus administration of intravenous contrast. Multiplanar CT image reconstructions and MIPs were obtained to evaluate the vascular anatomy. Multidetector CT imaging of the abdomen and pelvis was performed using the standard protocol during bolus administration of intravenous contrast. CONTRAST:  163mL OMNIPAQUE IOHEXOL 350 MG/ML SOLN COMPARISON:  None. FINDINGS: CTA CHEST FINDINGS Cardiovascular: Filling defects are noted in peripheral branches of both pulmonary arteries consistent with acute pulmonary emboli. Atherosclerosis of thoracic aorta is noted without aneurysm or dissection. Mild cardiomegaly is noted. No pericardial effusion is noted. Mediastinum/Nodes: Large sliding-type hiatal hernia is noted. Thyroid gland is unremarkable. No adenopathy is noted. Lungs/Pleura: No pneumothorax or pleural effusion is noted. Irregular density is noted peripherally in the left upper lobe with 16 x 11 mm irregular solid density; this may simply represent focal inflammation, but underlying neoplasm cannot be excluded. Follow-up CT scan in 3 weeks is recommended to ensure resolution or stability. Musculoskeletal: No chest wall abnormality. No acute or significant osseous findings. Review of the MIP images confirms the above findings. CT ABDOMEN and PELVIS FINDINGS Hepatobiliary: No focal liver abnormality is seen. No gallstones, gallbladder wall thickening, or biliary dilatation. Pancreas: Unremarkable. No pancreatic ductal dilatation or surrounding inflammatory changes. Spleen: Normal in size without focal abnormality. Adrenals/Urinary Tract: Adrenal glands are unremarkable. Bilateral renal cysts are noted. No hydronephrosis or renal obstruction is noted. No renal or ureteral calculi are noted.  Urinary bladder is unremarkable. Stomach/Bowel: There is a large intussusception involving the terminal ileum, right and transverse colon. Possible 19 mm lipoma may be at the lead point. Sigmoid diverticulosis is noted without inflammation. Vascular/Lymphatic: Aortic atherosclerosis. No enlarged abdominal or pelvic lymph nodes. Reproductive: Uterus and bilateral adnexa are unremarkable. Other: No abdominal wall hernia or abnormality. No abdominopelvic ascites. Musculoskeletal: No acute or significant osseous findings. Review of the MIP images confirms the above findings. IMPRESSION: 1. Acute bilateral pulmonary emboli are noted. 2. Irregular density is noted peripherally in the left upper lobe with 16 x 11 mm irregular solid density; this may simply represent focal inflammation, but underlying neoplasm cannot be excluded. Follow-up CT scan in 3 weeks is recommended to ensure resolution or stability. 3. Large intussusception is seen involving the terminal ileum, right and transverse colon. Possible 19 mm lipoma may be at the lead point. 4. Large sliding-type hiatal hernia is noted. 5. Sigmoid diverticulosis without inflammation. Critical Value/emergent results were called by telephone at the time of interpretation on 04/29/2020 at 3:17 pm to provider [facility] , who verbally acknowledged these results. Aortic Atherosclerosis (ICD10-I70.0). Electronically Signed   By: Marijo Conception M.D.   On: 04/29/2020 15:17   CT ABDOMEN PELVIS W CONTRAST  Result Date: 04/29/2020 CLINICAL DATA:  Lower abdominal pain, bloody stools. EXAM: CT ANGIOGRAPHY CHEST CT ABDOMEN AND PELVIS WITH CONTRAST TECHNIQUE: Multidetector CT imaging of the chest was performed using the standard protocol during bolus administration of intravenous contrast. Multiplanar CT image reconstructions and MIPs were obtained to evaluate the vascular anatomy. Multidetector CT imaging of the abdomen and pelvis was performed using the standard protocol  during bolus administration of intravenous contrast. CONTRAST:  113mL OMNIPAQUE IOHEXOL 350 MG/ML SOLN COMPARISON:  None. FINDINGS: CTA CHEST FINDINGS Cardiovascular: Filling defects are noted in peripheral branches of both pulmonary arteries consistent with acute pulmonary emboli. Atherosclerosis of thoracic aorta is noted without aneurysm or dissection. Mild cardiomegaly is noted. No pericardial effusion is noted. Mediastinum/Nodes: Large sliding-type hiatal hernia is noted. Thyroid gland is unremarkable. No adenopathy is noted. Lungs/Pleura:  No pneumothorax or pleural effusion is noted. Irregular density is noted peripherally in the left upper lobe with 16 x 11 mm irregular solid density; this may simply represent focal inflammation, but underlying neoplasm cannot be excluded. Follow-up CT scan in 3 weeks is recommended to ensure resolution or stability. Musculoskeletal: No chest wall abnormality. No acute or significant osseous findings. Review of the MIP images confirms the above findings. CT ABDOMEN and PELVIS FINDINGS Hepatobiliary: No focal liver abnormality is seen. No gallstones, gallbladder wall thickening, or biliary dilatation. Pancreas: Unremarkable. No pancreatic ductal dilatation or surrounding inflammatory changes. Spleen: Normal in size without focal abnormality. Adrenals/Urinary Tract: Adrenal glands are unremarkable. Bilateral renal cysts are noted. No hydronephrosis or renal obstruction is noted. No renal or ureteral calculi are noted. Urinary bladder is unremarkable. Stomach/Bowel: There is a large intussusception involving the terminal ileum, right and transverse colon. Possible 19 mm lipoma may be at the lead point. Sigmoid diverticulosis is noted without inflammation. Vascular/Lymphatic: Aortic atherosclerosis. No enlarged abdominal or pelvic lymph nodes. Reproductive: Uterus and bilateral adnexa are unremarkable. Other: No abdominal wall hernia or abnormality. No abdominopelvic ascites.  Musculoskeletal: No acute or significant osseous findings. Review of the MIP images confirms the above findings. IMPRESSION: 1. Acute bilateral pulmonary emboli are noted. 2. Irregular density is noted peripherally in the left upper lobe with 16 x 11 mm irregular solid density; this may simply represent focal inflammation, but underlying neoplasm cannot be excluded. Follow-up CT scan in 3 weeks is recommended to ensure resolution or stability. 3. Large intussusception is seen involving the terminal ileum, right and transverse colon. Possible 19 mm lipoma may be at the lead point. 4. Large sliding-type hiatal hernia is noted. 5. Sigmoid diverticulosis without inflammation. Critical Value/emergent results were called by telephone at the time of interpretation on 04/29/2020 at 3:17 pm to provider [facility] , who verbally acknowledged these results. Aortic Atherosclerosis (ICD10-I70.0). Electronically Signed   By: Marijo Conception M.D.   On: 04/29/2020 15:17   DG Chest Portable 1 View  Result Date: 04/29/2020 CLINICAL DATA:  Shortness of breath. EXAM: PORTABLE CHEST 1 VIEW COMPARISON:  05/24/2017 FINDINGS: Large hiatal hernia, better characterized on priors. Similar lung hyperinflation. Small left pleural effusion versus scar. Bibasilar (left greater than right) opacities. Mild enlargement of the cardiac silhouette, similar to prior. Aortic atherosclerosis. No acute bony abnormality. IMPRESSION: 1. Small left pleural effusion versus scar. Bibasilar opacities may represent atelectasis, although aspiration and/or pneumonia is not excluded. 2. Hyperinflation/COPD. 3. Mild cardiomegaly. Electronically Signed   By: Margaretha Sheffield MD   On: 04/29/2020 12:41    EKG: Independently reviewed.  EKG shows normal sinus rhythm.  Nonspecific ST changes.  No acute ST elevation or depression.  QTc is 453  Assessment/Plan Principal Problem:   Pulmonary embolism, bilateral ([place]) [name] will be admitted to telemetry  with bilateral PE associated with hypoxia.  Is on supplemental oxygen to keep O2 sat between 92 to 96% Patient has not started on anticoagulation secondary to acute GI bleeding that was witnessed in the emergency room.  Patient reports has been having blood in her stool for the last week.  Active Problems:   Intussusception ([place]) Intussusception noted on CT of her abdomen.  General surgery was consulted and awaiting their evaluation.    Hypokalemia Mild hypokalemia.  Patient was given 40 mEq of potassium p.o. in the emergency room at [facility] earlier.  Will recheck electrolytes renal function morning    Essential hypertension  Patient had no take her antihypertensive medications last 2 days secondary to not being able to tolerate p.o. intake.  Monitor blood pressure and provide IV antihypertensives as needed for blood pressure greater than 160/90    GI bleed  Patient history of diverticulitis.  There is no acute diverticulitis on CT scan.  Patient does have intussusception and has had witnessed blood from her rectum.  Will monitor hemoglobin and hematocrit.  Surgery has been consulted and will evaluate patient for possible surgical intervention for intussusception.    Abnormal CT scan of lung Patient has a left upper lobe density which could be a focal inflammation versus neoplasm.  Patient started on empiric antibiotic coverage for community-acquired pneumonia with hypoxia and CT finding.  Patient will need repeat CT scan in 3 to 4 weeks to document improvement of opacity.    Hypoxia Oxygen as needed to keep O2 sat 92 to 96%. Incentive spirometer every hour while awake.        DVT prophylaxis: SCDs for DVT prophylaxis as patient cannot be put on anticoagulation with GI bleeding Code Status:   Full code.  Discussed patient's CODE STATUS with her and she states she would want to be resuscitated if she were to stop breathing or heart were to stop.  Family Communication:   Diagnosis plan discussed with patient.  Patient verbalized understanding and agrees with plan.  Further recommendations to follow as clinically indicated Disposition Plan:   Patient is from:  Home  Anticipated DC to:  Home  Anticipated DC date:  Anticipate greater than 2 midnight stay in the hospital to treat acute medical condition  Anticipated DC barriers: No barriers to discharge identified at this time  Consults called:  General surgery Admission status:  Inpatient  Severity of Illness: The appropriate patient status for this patient is INPATIENT. Inpatient status is judged to be reasonable and necessary in order to provide the required intensity of service to ensure the patient's safety. The patient's presenting symptoms, physical exam findings, and initial radiographic and laboratory data in the context of their chronic comorbidities is felt to place them at high risk for further clinical deterioration. Furthermore, it is not anticipated that the patient will be medically stable for discharge from the hospital within 2 midnights of admission. The following factors support the patient status of inpatient.    * I certify that at the point of admission it is my clinical judgment that the patient will require inpatient hospital care spanning beyond 2 midnights from the point of admission due to high intensity of service, high risk for further deterioration and high frequency of surveillance required.Yevonne Aline Kaytelyn Glore MD [facility]  How to contact the [facility] Attending or Consulting provider [place] or covering provider during after hours [place], for this patient?   1. Check the care team in [facility] and look for a) attending/consulting [facility] provider listed and b) the [facility] team listed 2. Log into [email or web address] and use [place]'s universal password to access. If you do not have the password, please contact the hospital operator. 3. Locate the [facility] provider you are looking for under [facility] and page to a number that you can be directly reached. 4. If you still have difficulty reaching the provider, please page the [facility] (Director on Call) for the Hospitalists listed on amion for assistance.  04/29/2020, 8:08 PM

## 2020-04-29 NOTE — ED Provider Notes (Signed)
7:02 PM Patient arrived as a transfer from [place] prior to admission to medicine service with consultation by general surgery for intussusception as well as new bilateral pulmonary emboli and infections.  I assessed the patient she is resting comfortably on 4 L nasal cannula oxygen.  Blood pressure is not hypotensive.  She is not tachycardic.  She is resting comfortably.  Per the plan outlined in the previous notes, will call the medicine team for admission here as she was previously accepted to [facility] hospitalist service.  7:23 PM Medicine called her admission.  They will come see patient.  General surgery also be called to let them know the patient who they were consulted on is here.  Patient will be admitted by medicine.    Arjay Jaskiewicz, Gwenyth Allegra, MD 04/29/20 2119

## 2020-04-30 ENCOUNTER — Inpatient Hospital Stay (HOSPITAL_COMMUNITY): Payer: Medicare HMO

## 2020-04-30 ENCOUNTER — Encounter (HOSPITAL_COMMUNITY): Payer: Self-pay | Admitting: Family Medicine

## 2020-04-30 DIAGNOSIS — I10 Essential (primary) hypertension: Secondary | ICD-10-CM

## 2020-04-30 DIAGNOSIS — K922 Gastrointestinal hemorrhage, unspecified: Secondary | ICD-10-CM

## 2020-04-30 DIAGNOSIS — E876 Hypokalemia: Secondary | ICD-10-CM

## 2020-04-30 DIAGNOSIS — R918 Other nonspecific abnormal finding of lung field: Secondary | ICD-10-CM

## 2020-04-30 DIAGNOSIS — Z7189 Other specified counseling: Secondary | ICD-10-CM

## 2020-04-30 DIAGNOSIS — K561 Intussusception: Principal | ICD-10-CM

## 2020-04-30 DIAGNOSIS — R0902 Hypoxemia: Secondary | ICD-10-CM

## 2020-04-30 DIAGNOSIS — Z01810 Encounter for preprocedural cardiovascular examination: Secondary | ICD-10-CM

## 2020-04-30 DIAGNOSIS — I2699 Other pulmonary embolism without acute cor pulmonale: Secondary | ICD-10-CM

## 2020-04-30 DIAGNOSIS — Z515 Encounter for palliative care: Secondary | ICD-10-CM

## 2020-04-30 DIAGNOSIS — J9601 Acute respiratory failure with hypoxia: Secondary | ICD-10-CM

## 2020-04-30 LAB — BASIC METABOLIC PANEL
Anion gap: 10 (ref 5–15)
BUN: <5 mg/dL — ABNORMAL LOW (ref 8–23)
CO2: 27 mmol/L (ref 22–32)
Calcium: 8 mg/dL — ABNORMAL LOW (ref 8.9–10.3)
Chloride: 100 mmol/L (ref 98–111)
Creatinine, Ser: 0.5 mg/dL (ref 0.44–1.00)
GFR calc Af Amer: >60 mL/min (ref >60)
GFR calc non Af Amer: >60 mL/min (ref >60)
Glucose, Bld: 93 mg/dL (ref 70–99)
Potassium: 3.4 mmol/L — ABNORMAL LOW (ref 3.5–5.1)
Sodium: 137 mmol/L (ref 135–145)

## 2020-04-30 LAB — CBC
HCT: 38.5 % (ref 36.0–46.0)
Hemoglobin: 12.7 g/dL (ref 12.0–15.0)
MCH: 30.7 pg (ref 26.0–34.0)
MCHC: 33 g/dL (ref 30.0–36.0)
MCV: 93 fL (ref 80.0–100.0)
Platelets: 234 10*3/uL (ref 150–400)
RBC: 4.14 MIL/uL (ref 3.87–5.11)
RDW: 12.9 % (ref 11.5–15.5)
WBC: 7.4 10*3/uL (ref 4.0–10.5)
nRBC: 0 % (ref 0.0–0.2)

## 2020-04-30 LAB — MAGNESIUM: Magnesium: 1.6 mg/dL — ABNORMAL LOW (ref 1.7–2.4)

## 2020-04-30 LAB — PREALBUMIN: Prealbumin: 5.2 mg/dL — ABNORMAL LOW (ref 18–38)

## 2020-04-30 LAB — HEPARIN LEVEL (UNFRACTIONATED): Heparin Unfractionated: 0.11 IU/mL — ABNORMAL LOW (ref 0.30–0.70)

## 2020-04-30 MED ORDER — HEPARIN BOLUS VIA INFUSION
3000.0000 [IU] | Freq: Once | INTRAVENOUS | Status: AC
  Administered 2020-04-30: 3000 [IU] via INTRAVENOUS
  Filled 2020-04-30: qty 3000

## 2020-04-30 MED ORDER — HYDRALAZINE HCL 20 MG/ML IJ SOLN: 10.0000 mg | Freq: Four times a day (QID) | INTRAMUSCULAR | Status: DC | PRN

## 2020-04-30 MED ORDER — POTASSIUM CHLORIDE 20 MEQ/15ML (10%) PO SOLN
40.0000 meq | Freq: Once | ORAL | Status: AC
  Administered 2020-04-30: 40 meq via ORAL
  Filled 2020-04-30: qty 30

## 2020-04-30 MED ORDER — HEPARIN (PORCINE) 25000 UT/250ML-% IV SOLN
950.0000 [IU]/h | INTRAVENOUS | Status: DC
  Administered 2020-04-30: 800 [IU]/h via INTRAVENOUS
  Filled 2020-04-30: qty 250

## 2020-04-30 MED ORDER — MAGNESIUM SULFATE 50 % IJ SOLN: 1.0000 g | Freq: Once | INTRAMUSCULAR | Status: DC

## 2020-04-30 MED ORDER — MAGNESIUM SULFATE 2 GM/50ML IV SOLN
2.0000 g | Freq: Once | INTRAVENOUS | Status: AC
  Administered 2020-04-30: 2 g via INTRAVENOUS
  Filled 2020-04-30: qty 50

## 2020-04-30 MED ORDER — HEPARIN BOLUS VIA INFUSION
2000.0000 [IU] | Freq: Once | INTRAVENOUS | Status: AC
  Administered 2020-04-30: 2000 [IU] via INTRAVENOUS
  Filled 2020-04-30: qty 2000

## 2020-04-30 MED ORDER — MAGNESIUM SULFATE IN D5W 1-5 GM/100ML-% IV SOLN
1.0000 g | Freq: Once | INTRAVENOUS | Status: AC
  Administered 2020-04-30: 1 g via INTRAVENOUS
  Filled 2020-04-30: qty 100

## 2020-04-30 NOTE — TOC Benefit Eligibility Note (Signed)
Transition of Care [facility]) Benefit Eligibility Note    Patient Details  Name: Ariel Medina MRN: [id number] Date of Birth: 08/03/1928   Medication/Dose: Arne [place]  5 MG BID  Covered?: Yes  Tier: 3 Drug  Prescription Coverage Preferred Pharmacy: [facility] [place] with Person/Company/Phone Number:: [employer]  @ [employer] [facility] # [phone]  Co-Pay: $9.20  Prior Approval: No  Deductible: Met  Additional Notes: XARELTO  20 MG DAILY  COVER- YES  CO-PAY- $9.20  TIER- 3 DRUG  P/A -NO    Memory Argue Phone Number: 04/30/2020, 1:21 PM

## 2020-04-30 NOTE — Progress Notes (Signed)
   04/30/20 1233  Clinical Encounter Type  Visited With Patient  Visit Type Initial;Spiritual support  Referral From Physician  Consult/Referral To Chaplain  Stress Factors  Patient Stress Factors Health changes  This chaplain responded to MD consult for prayer.  This chaplain was greeted by the Pt. joyful spirit and faith in God's companionship in her health.  The Pt. is able to articulate with the chaplain her surgery discussions and input from the medical team. The chaplain learned the Pt. lives independently, next door to her sister and enjoys a relationship with children, grand-children, and great grand children. The Pt. states she is at "peace" with her 89 years of life, but not ready to give up.  The Pt. accepted prayer with the chaplain and F/U spiritual care as needed.

## 2020-04-30 NOTE — Consult Note (Addendum)
Cardiology Consultation:   Patient ID: Ariel Medina MRN: [id number]; DOB: 03/05/1928  Admit date: 04/29/2020 Date of Consult: 04/30/2020  Primary Care Provider: Colon Branch, MD [facility] Cardiologist: Minus Breeding, MD  [facility] Electrophysiologist:  None    Patient Profile:   Ariel Medina is a 86 y.o. female with a hx of HTN, HLD, hiatal hernia, hypothyroidism, breast CA s/p lumpectomy, diverticulitis, pelvic fracture 2018 who is being seen today for the evaluation of pre-operative clearance at the request of Dr. Louanne Belton.  History of Present Illness:   [name] reports she's enjoyed good health most of her life. She does her own sweeping and housekeeping and walks to the mailbox. She uses a cane or walker since she fell and broke her pelvis about 3 years ago. She reports mild chronic dyspnea which she's attributed to her hiatal hernia without recent change. A few weeks ago she began struggling with diarrhea and abdominal pain. She was seen in primary care office yesterday for follow-up and found to have positive hemoccult, hypoxia and tachycardia (HR 122, O2 sat 85% after walking, 92% at rest). She was sent to the [facility] for further evaluation. CT chest/abd/pelvis showed acute bilateral PE, irregular density in the LUL (focal inflammation vs neoplasm), large intussusception in the terminal ileum, right and traverse colon, large sliding haital hernia, and sigmoid diverticulitis, possible 10mm lipoma may be at the lead point. She was started on antibiotics for CAP along with flagyl for possible intra-abdominal infection. She was subsequently transferred to [facility] for surgical evaluation, pending partial colectomy tomorrow. She was started on IV heparin. LE venous duplex is pending. PCCM notes indicate their suspect possible colon CA as cause for her intussception. She saw palliative care today and per their discussions, she would want any and all treatments as offered at this  time, but would not want long term life support. Labs otherwise show hypokalemia, hypomagnesemia, Cr 0.50, normal CBC but with Hgb trend in the 15s->12.7. EKG shows NSR with first degree AVB and brief sinus pauses. Telemetry shows similar findings, rare pauses <2 sec (possible blocked PAC on one occasion), also what appears to be brief bundle branch block, not captured on EKG. She denies any acute complaints today and says she was really shocked to find out she had blood clots in her lungs as well.   Past Medical History:  Diagnosis Date  . [place]    left breast-invasive ductal ca stage 1-stopped arimidex 2008  . Diverticulitis of colon   . Hiatal hernia   . Hyperlipidemia   . Hypertension   . Hypothyroidism   . Osteoarthritis   . Osteopenia    DEXA 7/02  . Syncope 2005   CT head (-), ECHO essent. neg, stress test (-), carotid u/s (-)    Past Surgical History:  Procedure Laterality Date  . BREAST BIOPSY Bilateral   . BREAST LUMPECTOMY  04/2002   left  . TONSILLECTOMY AND ADENOIDECTOMY       Home Medications:  Prior to Admission medications   Medication Sig Start Date End Date Taking? Authorizing Provider  aspirin 81 MG tablet Take 81 mg by mouth every morning.    Yes [provider]  hydrochlorothiazide (HYDRODIURIL) 25 MG tablet TAKE 1 TABLET EVERY DAY Patient taking differently: Take 25 mg by mouth daily.  04/03/20  Yes Colon Branch, MD  levothyroxine (SYNTHROID) 75 MCG tablet Take 1 tablet (75 mcg total) by mouth daily before breakfast. 09/12/19  Yes Paz, Alda Berthold, MD  Multiple Vitamins-Minerals (ICAPS AREDS 2 PO) Take 1 capsule by mouth 2 (two) times daily.   Yes [provider]  rosuvastatin (CRESTOR) 5 MG tablet Take 1 tablet (5 mg total) by mouth daily. Patient taking differently: Take 5 mg by mouth 3 (three) times a week. No specific days 12/05/17  Yes Paz, Alda Berthold, MD  cyclobenzaprine (FLEXERIL) 5 MG tablet Take 1 tablet (5 mg total) by mouth 3 (three)  times daily as needed for muscle spasms. Patient not taking: Reported on 04/30/2020 12/07/19   Colon Branch, MD  fluticasone [facility]) 50 MCG/ACT nasal spray Place 2 sprays into both nostrils daily as needed for allergies or rhinitis. Patient not taking: Reported on 04/30/2020 08/03/17   Colon Branch, MD  polyethylene glycol [facility] Floria Raveling) packet Take 17 g by mouth daily as needed for moderate constipation. Patient not taking: Reported on 04/29/2020 11/30/16   Cristal Ford, DO  potassium chloride (K-DUR,KLOR-CON) 10 MEQ tablet Take 1 tablet (10 mEq total) by mouth daily. Patient not taking: Reported on 04/30/2020 07/07/17   Colon Branch, MD    Inpatient Medications: Scheduled Meds:  Continuous Infusions: . azithromycin Stopped (04/29/20 2133)  . cefTRIAXone (ROCEPHIN)  IV Stopped (04/29/20 2106)  . heparin 800 Units/hr (04/30/20 1315)  . lactated ringers 75 mL/hr at 04/30/20 1241  . magnesium sulfate bolus IVPB    . metronidazole 500 mg (04/30/20 1423)   PRN Meds: hydrALAZINE, HYDROmorphone (DILAUDID) injection, influenza vaccine adjuvanted, ondansetron **OR** ondansetron (ZOFRAN) IV  Allergies:    Allergies  Allergen Reactions  . Ibuprofen     REACTION: bleed    Social History:   Social History   Socioeconomic History  . Marital status: Widowed    Spouse name: Not on file  . Number of children: 2  . Years of education: Not on file  . Highest education level: Not on file  Occupational History  . Occupation: n/a    Employer: RETIRED  Tobacco Use  . Smoking status: Never Smoker  . Smokeless tobacco: Never Used  Vaping Use  . Vaping Use: Never used  Substance and Sexual Activity  . Alcohol use: No  . Drug use: No  . Sexual activity: Not Currently  Other Topics Concern  . Not on file  Social History Narrative   Lost a son 83   G-son  live w/ her from time to time, not permanently   ([name] is her son and used to be my pt,  h/o etoh)   Sister live next  door , does help the pt    G-son @ [employer] , G-daughter @ [employer] since 1993   still drives, short distances, typically not at night.   Social Determinants of Health   Financial Resource Strain: Low Risk   . Difficulty of Paying Living Expenses: Not hard at all  Food Insecurity: No Food Insecurity  . Worried About Charity fundraiser in the Last Year: Never true  . Ran Out of Food in the Last Year: Never true  Transportation Needs: No Transportation Needs  . Lack of Transportation (Medical): No  . Lack of Transportation (Non-Medical): No  Physical Activity:   . Days of Exercise per Week: Not on file  . Minutes of Exercise per Session: Not on file  Stress: No Stress Concern Present  . Feeling of Stress : Only a little  Social Connections:   . Frequency of Communication with  Friends and Family: Not on file  . Frequency of Social Gatherings with Friends and Family: Not on file  . Attends Religious Services: Not on file  . Active Member of Clubs or Organizations: Not on file  . Attends Archivist Meetings: Not on file  . Marital Status: Not on file  Intimate Partner Violence:   . Fear of Current or Ex-Partner: Not on file  . Emotionally Abused: Not on file  . Physically Abused: Not on file  . Sexually Abused: Not on file    Family History:    Family History  Problem Relation Age of Onset  . Heart attack Brother 16  . Diabetes Mother   . Stroke Father 74  . Stroke Sister 53  . Colon cancer Neg Hx   . Breast cancer Neg Hx      ROS:  Please see the history of present illness.   All other ROS reviewed and negative.     Physical Exam/Data:   Vitals:   04/29/20 2130 04/29/20 2240 04/30/20 0427 04/30/20 0802  BP: 137/80 (!) 149/84 131/75 (!) 147/78  Pulse: 84 84 83 80  Resp: (!) 26 (!) 24 (!) 26 (!) 21  Temp:  98.5 F (36.9 C) 98.2 F (36.8 C) 97.8 F (36.6 C)  TempSrc:  Oral Oral Oral  SpO2: 99% 100% 98% 98%  Weight:  54.7 kg    Height:  5'  2" (1.575 m)      Intake/Output Summary (Last 24 hours) at 04/30/2020 1447 Last data filed at 04/30/2020 0600 Gross per 24 hour  Intake 1330.58 ml  Output --  Net 1330.58 ml   Last 3 Weights 04/29/2020 04/29/2020 04/29/2020  Weight (lbs) 120 lb 9.5 oz 121 lb 7.6 oz 121 lb 8 oz  Weight (kg) 54.7 kg 55.1 kg 55.112 kg     Body mass index is 22.06 kg/m.  General: Well developed thin elderly WF in no acute distress, lively, conversant Head: Normocephalic, atraumatic, sclera non-icteric, no xanthomas, nares are without discharge. Neck: Negative for carotid bruits. JVP not elevated. Lungs: Clear bilaterally to auscultation without wheezes, rales, or rhonchi. Breathing is unlabored. Heart: RRR S1 S2 without murmurs, rubs, or gallops.  Abdomen: Soft, non-tender, non-distended with normoactive bowel sounds. No rebound/guarding. Extremities: No clubbing or cyanosis. No edema. Distal pedal pulses are 2+ and equal bilaterally. Neuro: Alert and oriented X 3. Moves all extremities spontaneously. Psych:  Responds to questions appropriately with a normal affect.   EKG:  The EKG was personally reviewed and demonstrates:  NSR 74bpm with what appear to be brief sinus pauses, nonspecific STT changes, first degree AVB Telemetry:  Telemetry was personally reviewed and demonstrates: NSR with rare pauses <2 sec (possible blocked PAC on one occasion), also what appears to be brief bundle branch block, not captured on EKG.  Relevant CV Studies: N/A  Laboratory Data:  High Sensitivity Troponin:   Recent Labs  Lab 04/29/20 1204 04/29/20 1501  TROPONINIHS 9 9     Chemistry Recent Labs  Lab 04/25/20 1620 04/29/20 1204 04/30/20 0046  NA 135 133* 137  K 4.0 3.3* 3.4*  CL 92* 91* 100  CO2 32 30 27  GLUCOSE 95 101* 93  BUN 9 8 <5*  CREATININE 0.64 0.66 0.50  CALCIUM 9.6 9.0 8.0*  GFRNONAA  --  >60 >60  GFRAA  --  >60 >60  ANIONGAP  --  12 10    Recent Labs  Lab 04/25/20 1620 04/29/20 1204  PROT 6.0* 6.0*  ALBUMIN  --  3.3*  AST 13 19  ALT 4* 8  ALKPHOS  --  46  BILITOT 0.7 0.9   Hematology Recent Labs  Lab 04/25/20 1620 04/29/20 1204 04/30/20 0046  WBC 7.5 9.4 7.4  RBC 5.01 5.07 4.14  HGB 15.6* 15.7* 12.7  HCT 45.9* 47.2* 38.5  MCV 91.6 93.1 93.0  MCH 31.1 31.0 30.7  MCHC 34.0 33.3 33.0  RDW 12.4 12.8 12.9  PLT 257 289 234   BNPNo results for input(s): BNP, PROBNP in the last 168 hours.  DDimer No results for input(s): DDIMER in the last 168 hours.   Radiology/Studies:  CT Angio Chest PE W and/or Wo Contrast  Result Date: 04/29/2020 CLINICAL DATA:  Lower abdominal pain, bloody stools. EXAM: CT ANGIOGRAPHY CHEST CT ABDOMEN AND PELVIS WITH CONTRAST TECHNIQUE: Multidetector CT imaging of the chest was performed using the standard protocol during bolus administration of intravenous contrast. Multiplanar CT image reconstructions and MIPs were obtained to evaluate the vascular anatomy. Multidetector CT imaging of the abdomen and pelvis was performed using the standard protocol during bolus administration of intravenous contrast. CONTRAST:  128mL OMNIPAQUE IOHEXOL 350 MG/ML SOLN COMPARISON:  None. FINDINGS: CTA CHEST FINDINGS Cardiovascular: Filling defects are noted in peripheral branches of both pulmonary arteries consistent with acute pulmonary emboli. Atherosclerosis of thoracic aorta is noted without aneurysm or dissection. Mild cardiomegaly is noted. No pericardial effusion is noted. Mediastinum/Nodes: Large sliding-type hiatal hernia is noted. Thyroid gland is unremarkable. No adenopathy is noted. Lungs/Pleura: No pneumothorax or pleural effusion is noted. Irregular density is noted peripherally in the left upper lobe with 16 x 11 mm irregular solid density; this may simply represent focal inflammation, but underlying neoplasm cannot be excluded. Follow-up CT scan in 3 weeks is recommended to ensure resolution or stability. Musculoskeletal: No chest wall abnormality. No  acute or significant osseous findings. Review of the MIP images confirms the above findings. CT ABDOMEN and PELVIS FINDINGS Hepatobiliary: No focal liver abnormality is seen. No gallstones, gallbladder wall thickening, or biliary dilatation. Pancreas: Unremarkable. No pancreatic ductal dilatation or surrounding inflammatory changes. Spleen: Normal in size without focal abnormality. Adrenals/Urinary Tract: Adrenal glands are unremarkable. Bilateral renal cysts are noted. No hydronephrosis or renal obstruction is noted. No renal or ureteral calculi are noted. Urinary bladder is unremarkable. Stomach/Bowel: There is a large intussusception involving the terminal ileum, right and transverse colon. Possible 19 mm lipoma may be at the lead point. Sigmoid diverticulosis is noted without inflammation. Vascular/Lymphatic: Aortic atherosclerosis. No enlarged abdominal or pelvic lymph nodes. Reproductive: Uterus and bilateral adnexa are unremarkable. Other: No abdominal wall hernia or abnormality. No abdominopelvic ascites. Musculoskeletal: No acute or significant osseous findings. Review of the MIP images confirms the above findings. IMPRESSION: 1. Acute bilateral pulmonary emboli are noted. 2. Irregular density is noted peripherally in the left upper lobe with 16 x 11 mm irregular solid density; this may simply represent focal inflammation, but underlying neoplasm cannot be excluded. Follow-up CT scan in 3 weeks is recommended to ensure resolution or stability. 3. Large intussusception is seen involving the terminal ileum, right and transverse colon. Possible 19 mm lipoma may be at the lead point. 4. Large sliding-type hiatal hernia is noted. 5. Sigmoid diverticulosis without inflammation. Critical Value/emergent results were called by telephone at the time of interpretation on 04/29/2020 at 3:17 pm to provider [facility] , who verbally acknowledged these results. Aortic Atherosclerosis (ICD10-I70.0). Electronically  Signed   By: Marijo Conception  M.D.   On: 04/29/2020 15:17   CT ABDOMEN PELVIS W CONTRAST  Result Date: 04/29/2020 CLINICAL DATA:  Lower abdominal pain, bloody stools. EXAM: CT ANGIOGRAPHY CHEST CT ABDOMEN AND PELVIS WITH CONTRAST TECHNIQUE: Multidetector CT imaging of the chest was performed using the standard protocol during bolus administration of intravenous contrast. Multiplanar CT image reconstructions and MIPs were obtained to evaluate the vascular anatomy. Multidetector CT imaging of the abdomen and pelvis was performed using the standard protocol during bolus administration of intravenous contrast. CONTRAST:  133mL OMNIPAQUE IOHEXOL 350 MG/ML SOLN COMPARISON:  None. FINDINGS: CTA CHEST FINDINGS Cardiovascular: Filling defects are noted in peripheral branches of both pulmonary arteries consistent with acute pulmonary emboli. Atherosclerosis of thoracic aorta is noted without aneurysm or dissection. Mild cardiomegaly is noted. No pericardial effusion is noted. Mediastinum/Nodes: Large sliding-type hiatal hernia is noted. Thyroid gland is unremarkable. No adenopathy is noted. Lungs/Pleura: No pneumothorax or pleural effusion is noted. Irregular density is noted peripherally in the left upper lobe with 16 x 11 mm irregular solid density; this may simply represent focal inflammation, but underlying neoplasm cannot be excluded. Follow-up CT scan in 3 weeks is recommended to ensure resolution or stability. Musculoskeletal: No chest wall abnormality. No acute or significant osseous findings. Review of the MIP images confirms the above findings. CT ABDOMEN and PELVIS FINDINGS Hepatobiliary: No focal liver abnormality is seen. No gallstones, gallbladder wall thickening, or biliary dilatation. Pancreas: Unremarkable. No pancreatic ductal dilatation or surrounding inflammatory changes. Spleen: Normal in size without focal abnormality. Adrenals/Urinary Tract: Adrenal glands are unremarkable. Bilateral renal cysts are  noted. No hydronephrosis or renal obstruction is noted. No renal or ureteral calculi are noted. Urinary bladder is unremarkable. Stomach/Bowel: There is a large intussusception involving the terminal ileum, right and transverse colon. Possible 19 mm lipoma may be at the lead point. Sigmoid diverticulosis is noted without inflammation. Vascular/Lymphatic: Aortic atherosclerosis. No enlarged abdominal or pelvic lymph nodes. Reproductive: Uterus and bilateral adnexa are unremarkable. Other: No abdominal wall hernia or abnormality. No abdominopelvic ascites. Musculoskeletal: No acute or significant osseous findings. Review of the MIP images confirms the above findings. IMPRESSION: 1. Acute bilateral pulmonary emboli are noted. 2. Irregular density is noted peripherally in the left upper lobe with 16 x 11 mm irregular solid density; this may simply represent focal inflammation, but underlying neoplasm cannot be excluded. Follow-up CT scan in 3 weeks is recommended to ensure resolution or stability. 3. Large intussusception is seen involving the terminal ileum, right and transverse colon. Possible 19 mm lipoma may be at the lead point. 4. Large sliding-type hiatal hernia is noted. 5. Sigmoid diverticulosis without inflammation. Critical Value/emergent results were called by telephone at the time of interpretation on 04/29/2020 at 3:17 pm to provider [facility] , who verbally acknowledged these results. Aortic Atherosclerosis (ICD10-I70.0). Electronically Signed   By: Marijo Conception M.D.   On: 04/29/2020 15:17   DG Chest Portable 1 View  Result Date: 04/29/2020 CLINICAL DATA:  Shortness of breath. EXAM: PORTABLE CHEST 1 VIEW COMPARISON:  05/24/2017 FINDINGS: Large hiatal hernia, better characterized on priors. Similar lung hyperinflation. Small left pleural effusion versus scar. Bibasilar (left greater than right) opacities. Mild enlargement of the cardiac silhouette, similar to prior. Aortic atherosclerosis. No  acute bony abnormality. IMPRESSION: 1. Small left pleural effusion versus scar. Bibasilar opacities may represent atelectasis, although aspiration and/or pneumonia is not excluded. 2. Hyperinflation/COPD. 3. Mild cardiomegaly. Electronically Signed   By: Margaretha Sheffield MD   On: 04/29/2020 12:41  Assessment and Plan:   1. Pre-op cardiovascular examination - RCRI calculated at 0.9% indicating low risk of CV complications based on prior cardiac history - however, with advanced age and acute VTE suspect she is at least moderate risk although it does not appear that clinically acute cardiac testing is needed. Echo may be reasonable for general information but will review with MD  2. Acute hypoxic respiratory failure with bilateral pulmonary emboli, also possible CAP versus pulmonary neoplasm - on heparin per pharmacy - LE venous duplex pending  3. Intussusception with possible colonic mass - pending partial colectomy tomorrow tentatively  4. Hypokalemia/hypomagnesemia - lyte management per primary team  5. HTN - with hypoK/hypoMg, consider using something like amlodipine for BP instead if needed  6. Brief sinus pauses/transient bundle branch block, not captured on EKG - may reflect some degree of underlying conduction disease, but no present indication for PPM - difficult to ascertain axis of bundle branch block, possibly LBBB given appearance in V lead but would try to get EKG if this persists to fully capture  For questions or updates, please contact [facility] Please consult [email or web address] for contact info under    Signed, Charlie Pitter, PA-C  04/30/2020 2:47 PM   History and all data above reviewed.  Patient examined.  I agree with the findings as above.   The patient is a very nice and independent patient without significant cardiovascular history.  She presented really for diarrhea.  She was found to have intussusception with possible mass.  She is also found to have acute  PE.   She denies any recent cardiovascular symptoms.  The patient denies any new symptoms such as chest discomfort, neck or arm discomfort. There has been no new shortness of breath, PND or orthopnea. There have been no reported palpitations, presyncope or syncope.  She lives with a grandson but she is independent. She does her grocery shopping and housework without symptoms.  She has had intermittent BBB on EKG but no sustained arrhythmias.  The patient exam reveals COR:RRR  ,  Lungs: Clear  ,  Abd: Positive bowel sounds, no rebound no guarding, Ext No edema  .  All available labs, radiology testing, previous records reviewed. Agree with documented assessment and plan.   PREOP:  The patient has low risk as above although her age adds independent risk.  There is no prohibitive risk and no further provocative testing that needs to take place prior to surgery.  I discussed this with the patient and her granddaughter.  I will check an echo.  PE:  She will need prolonged anticoagulation that likely will be indefinite if there is a malignancy and if no malignancy this would be considered unprovoked and still would be indefinite.   Jeneen Rinks Giani Winther  4:08 PM  04/30/2020

## 2020-04-30 NOTE — Consult Note (Addendum)
NAME:  Ariel Medina, MRN:  [id number], DOB:  09-08-1927, LOS: 1 ADMISSION DATE:  04/29/2020, CONSULTATION DATE: 04/30/2020 REFERRING MD: [facility], CHIEF COMPLAINT: PE along with bowel obstruction  Brief History   86 year old female bilateral PEs bowel obstruction possible new neoplasm 69-year-old  History of present illness   Ariel Medina is a 86 year old female with extensive past medical history which includes breast cancer, diverticulitis, hyperlipidemia, hypertension, hypothyroidism along with osteopenia and osteoarthritis.  She states she has been short of breath going on for several months.  But she still able to walk in the grocery store drive a car without difficulty.  Over the last 2 weeks she notes diarrhea with some bloody stools and some abdominal discomfort and more shortness of breath.  She was seen by her primary care physician initially labs were unremarkable but on follow-up she is noted to be increasingly short of breath and was sent to the emergency department where she was positive bilateral PEs, along with bowel obstruction she was admitted.  Evaluated by general surgery for possible intervention in the next few days she has been treated with heparin for her PEs incidental finding of left upper lobe mass which could be either pneumonia inflammatory or possible neoplasm.  Pulmonary critical care called to bedside to evaluate and possibly to remain on board if she has surgery.  Past Medical History   Past Medical History:  Diagnosis Date  . Breast CA ([place])    left breast-invasive ductal ca stage 1-stopped arimidex 2008  . Diverticulitis of colon   . Hiatal hernia   . Hyperlipidemia   . Hypertension   . Hypothyroidism   . Osteoarthritis   . Osteopenia    DEXA 7/02  . Syncope 2005   CT head (-), ECHO essent. neg, stress test (-), carotid u/s (-)     Significant Hospital Events     Consults:  Surgery Pulmonary critical care  Procedures:    Significant Diagnostic  Tests:  CT chest and abdomen is noted CT Angio Chest PE W and/or Wo Contrast  Result Date: 04/29/2020 CLINICAL DATA:  Lower abdominal pain, bloody stools. EXAM: CT ANGIOGRAPHY CHEST CT ABDOMEN AND PELVIS WITH CONTRAST TECHNIQUE: Multidetector CT imaging of the chest was performed using the standard protocol during bolus administration of intravenous contrast. Multiplanar CT image reconstructions and MIPs were obtained to evaluate the vascular anatomy. Multidetector CT imaging of the abdomen and pelvis was performed using the standard protocol during bolus administration of intravenous contrast. CONTRAST:  182mL OMNIPAQUE IOHEXOL 350 MG/ML SOLN COMPARISON:  None. FINDINGS: CTA CHEST FINDINGS Cardiovascular: Filling defects are noted in peripheral branches of both pulmonary arteries consistent with acute pulmonary emboli. Atherosclerosis of thoracic aorta is noted without aneurysm or dissection. Mild cardiomegaly is noted. No pericardial effusion is noted. Mediastinum/Nodes: Large sliding-type hiatal hernia is noted. Thyroid gland is unremarkable. No adenopathy is noted. Lungs/Pleura: No pneumothorax or pleural effusion is noted. Irregular density is noted peripherally in the left upper lobe with 16 x 11 mm irregular solid density; this may simply represent focal inflammation, but underlying neoplasm cannot be excluded. Follow-up CT scan in 3 weeks is recommended to ensure resolution or stability. Musculoskeletal: No chest wall abnormality. No acute or significant osseous findings. Review of the MIP images confirms the above findings. CT ABDOMEN and PELVIS FINDINGS Hepatobiliary: No focal liver abnormality is seen. No gallstones, gallbladder wall thickening, or biliary dilatation. Pancreas: Unremarkable. No pancreatic ductal dilatation or surrounding inflammatory changes. Spleen: Normal in size without focal  abnormality. Adrenals/Urinary Tract: Adrenal glands are unremarkable. Bilateral renal cysts are noted. No  hydronephrosis or renal obstruction is noted. No renal or ureteral calculi are noted. Urinary bladder is unremarkable. Stomach/Bowel: There is a large intussusception involving the terminal ileum, right and transverse colon. Possible 19 mm lipoma may be at the lead point. Sigmoid diverticulosis is noted without inflammation. Vascular/Lymphatic: Aortic atherosclerosis. No enlarged abdominal or pelvic lymph nodes. Reproductive: Uterus and bilateral adnexa are unremarkable. Other: No abdominal wall hernia or abnormality. No abdominopelvic ascites. Musculoskeletal: No acute or significant osseous findings. Review of the MIP images confirms the above findings. IMPRESSION: 1. Acute bilateral pulmonary emboli are noted. 2. Irregular density is noted peripherally in the left upper lobe with 16 x 11 mm irregular solid density; this may simply represent focal inflammation, but underlying neoplasm cannot be excluded. Follow-up CT scan in 3 weeks is recommended to ensure resolution or stability. 3. Large intussusception is seen involving the terminal ileum, right and transverse colon. Possible 19 mm lipoma may be at the lead point. 4. Large sliding-type hiatal hernia is noted. 5. Sigmoid diverticulosis without inflammation. Critical Value/emergent results were called by telephone at the time of interpretation on 04/29/2020 at 3:17 pm to provider [facility] , who verbally acknowledged these results. Aortic Atherosclerosis (ICD10-I70.0). Electronically Signed   By: Marijo Conception M.D.   On: 04/29/2020 15:17   CT ABDOMEN PELVIS W CONTRAST  Result Date: 04/29/2020 CLINICAL DATA:  Lower abdominal pain, bloody stools. EXAM: CT ANGIOGRAPHY CHEST CT ABDOMEN AND PELVIS WITH CONTRAST TECHNIQUE: Multidetector CT imaging of the chest was performed using the standard protocol during bolus administration of intravenous contrast. Multiplanar CT image reconstructions and MIPs were obtained to evaluate the vascular anatomy.  Multidetector CT imaging of the abdomen and pelvis was performed using the standard protocol during bolus administration of intravenous contrast. CONTRAST:  176mL OMNIPAQUE IOHEXOL 350 MG/ML SOLN COMPARISON:  None. FINDINGS: CTA CHEST FINDINGS Cardiovascular: Filling defects are noted in peripheral branches of both pulmonary arteries consistent with acute pulmonary emboli. Atherosclerosis of thoracic aorta is noted without aneurysm or dissection. Mild cardiomegaly is noted. No pericardial effusion is noted. Mediastinum/Nodes: Large sliding-type hiatal hernia is noted. Thyroid gland is unremarkable. No adenopathy is noted. Lungs/Pleura: No pneumothorax or pleural effusion is noted. Irregular density is noted peripherally in the left upper lobe with 16 x 11 mm irregular solid density; this may simply represent focal inflammation, but underlying neoplasm cannot be excluded. Follow-up CT scan in 3 weeks is recommended to ensure resolution or stability. Musculoskeletal: No chest wall abnormality. No acute or significant osseous findings. Review of the MIP images confirms the above findings. CT ABDOMEN and PELVIS FINDINGS Hepatobiliary: No focal liver abnormality is seen. No gallstones, gallbladder wall thickening, or biliary dilatation. Pancreas: Unremarkable. No pancreatic ductal dilatation or surrounding inflammatory changes. Spleen: Normal in size without focal abnormality. Adrenals/Urinary Tract: Adrenal glands are unremarkable. Bilateral renal cysts are noted. No hydronephrosis or renal obstruction is noted. No renal or ureteral calculi are noted. Urinary bladder is unremarkable. Stomach/Bowel: There is a large intussusception involving the terminal ileum, right and transverse colon. Possible 19 mm lipoma may be at the lead point. Sigmoid diverticulosis is noted without inflammation. Vascular/Lymphatic: Aortic atherosclerosis. No enlarged abdominal or pelvic lymph nodes. Reproductive: Uterus and bilateral adnexa are  unremarkable. Other: No abdominal wall hernia or abnormality. No abdominopelvic ascites. Musculoskeletal: No acute or significant osseous findings. Review of the MIP images confirms the above findings. IMPRESSION: 1. Acute bilateral pulmonary  emboli are noted. 2. Irregular density is noted peripherally in the left upper lobe with 16 x 11 mm irregular solid density; this may simply represent focal inflammation, but underlying neoplasm cannot be excluded. Follow-up CT scan in 3 weeks is recommended to ensure resolution or stability. 3. Large intussusception is seen involving the terminal ileum, right and transverse colon. Possible 19 mm lipoma may be at the lead point. 4. Large sliding-type hiatal hernia is noted. 5. Sigmoid diverticulosis without inflammation. Critical Value/emergent results were called by telephone at the time of interpretation on 04/29/2020 at 3:17 pm to provider [facility] , who verbally acknowledged these results. Aortic Atherosclerosis (ICD10-I70.0). Electronically Signed   By: Marijo Conception M.D.   On: 04/29/2020 15:17   DG Chest Portable 1 View  Result Date: 04/29/2020 CLINICAL DATA:  Shortness of breath. EXAM: PORTABLE CHEST 1 VIEW COMPARISON:  05/24/2017 FINDINGS: Large hiatal hernia, better characterized on priors. Similar lung hyperinflation. Small left pleural effusion versus scar. Bibasilar (left greater than right) opacities. Mild enlargement of the cardiac silhouette, similar to prior. Aortic atherosclerosis. No acute bony abnormality. IMPRESSION: 1. Small left pleural effusion versus scar. Bibasilar opacities may represent atelectasis, although aspiration and/or pneumonia is not excluded. 2. Hyperinflation/COPD. 3. Mild cardiomegaly. Electronically Signed   By: Margaretha Sheffield MD   On: 04/29/2020 12:41     Micro Data:    Antimicrobials:  921 Zithromax 04/29/2020 Rocephin 04/29/2020 Flagyl  Interim history/subjective:  86 year old female being evaluated for surgery  for bowel obstruction  Objective   Blood pressure (!) 147/78, pulse 80, temperature 97.8 F (36.6 C), temperature source Oral, resp. rate (!) 21, height 5\' 2"  (1.575 m), weight 54.7 kg, SpO2 98 %.        Intake/Output Summary (Last 24 hours) at 04/30/2020 1223 Last data filed at 04/30/2020 0600 Gross per 24 hour  Intake 2332.41 ml  Output --  Net 2332.41 ml   Filed Weights   04/29/20 1145 04/29/20 2240  Weight: 55.1 kg 54.7 kg    Examination: General: Thin frail female in no acute distress at rest HENT: No JVD or lymphadenopathy is appreciated Lungs: Diminished in the bases Cardiovascular: Heart sounds are regular Abdomen: Not overly distended, faint bowel sounds tender but not overly so Lower extremities with 1+ edema Neuro: Grossly intact   Resolved Hospital Problem list     Assessment & Plan:  Incidental finding of bilateral PEs in the setting of 2 weeks of diarrhea with frequent shortness of breath with a history of breast cancer new left upper lung irregular mass . Pulmonary critical care consulted for continuing anticoagulation for PEs.  Note the surgery is being delayed therefore not unreasonable to continue anticoagulation with heparin as long as a stop.  There is a stop warfarin 0 400 just in case she needs urgent surgery. Supplemental oxygen to keep sats greater than 92% She will most likely need follow-up in ICU after surgery. She is a full code and wishes full measures taken. Although she is 86 years old she drives and takes care of her self. Further work-up for lung mass can be done in the future. LEDS ro dvt ? IVC filter   Intussusception per CT scan She notes a 2-week history of diarrhea abdominal discomfort and some blood-streaked stools. Surgery is following Plan for surgery in the future but not emergently at this time. Per surgery  Hypertension Continue to monitor Per primary   Best practice:  Diet: cld Pain/Anxiety/Delirium protocol (if  indicated): narcotics  VAP protocol (if indicated): na DVT prophylaxis: on heparin GI prophylaxis: ppi Glucose control: ni Mobility: br Code Status: full Family Communication: per primary Disposition: sdu  Labs   CBC: Recent Labs  Lab 04/25/20 1620 04/29/20 1204 04/30/20 0046  WBC 7.5 9.4 7.4  NEUTROABS 5,190 7.3  --   HGB 15.6* 15.7* 12.7  HCT 45.9* 47.2* 38.5  MCV 91.6 93.1 93.0  PLT 257 289 102    Basic Metabolic Panel: Recent Labs  Lab 04/25/20 1620 04/29/20 1204 04/30/20 0046  NA 135 133* 137  K 4.0 3.3* 3.4*  CL 92* 91* 100  CO2 32 30 27  GLUCOSE 95 101* 93  BUN 9 8 <5*  CREATININE 0.64 0.66 0.50  CALCIUM 9.6 9.0 8.0*  MG  --   --  1.6*   GFR: Estimated Creatinine Clearance: 37 mL/min (by C-G formula based on SCr of 0.5 mg/dL). Recent Labs  Lab 04/25/20 1620 04/29/20 1204 04/30/20 0046  WBC 7.5 9.4 7.4    Liver Function Tests: Recent Labs  Lab 04/25/20 1620 04/29/20 1204  AST 13 19  ALT 4* 8  ALKPHOS  --  46  BILITOT 0.7 0.9  PROT 6.0* 6.0*  ALBUMIN  --  3.3*   Recent Labs  Lab 04/25/20 1620 04/29/20 1204  LIPASE 8 24   No results for input(s): AMMONIA in the last 168 hours.  ABG No results found for: PHART, PCO2ART, PO2ART, HCO3, TCO2, ACIDBASEDEF, O2SAT   Coagulation Profile: No results for input(s): INR, PROTIME in the last 168 hours.  Cardiac Enzymes: No results for input(s): CKTOTAL, CKMB, CKMBINDEX, TROPONINI in the last 168 hours.  HbA1C: No results found for: HGBA1C  CBG: No results for input(s): GLUCAP in the last 168 hours.  Review of Systems:   10 point review of system taken, please see HPI for positives and negatives.   Past Medical History  She,  has a past medical history of Breast CA ([place]), Diverticulitis of colon, Hiatal hernia, Hyperlipidemia, Hypertension, Hypothyroidism, Osteoarthritis, Osteopenia, and Syncope (2005).   Surgical History    Past Surgical History:  Procedure Laterality Date  .  BREAST BIOPSY Bilateral   . BREAST LUMPECTOMY  04/2002   left  . TONSILLECTOMY AND ADENOIDECTOMY       Social History   reports that she has never smoked. She has never used smokeless tobacco. She reports that she does not drink alcohol and does not use drugs.   Family History   Her family history includes Diabetes in her mother; Heart attack (age of onset: 40) in her brother; Stroke (age of onset: 53) in her sister; Stroke (age of onset: 57) in her father. There is no history of Colon cancer or Breast cancer.   Allergies Allergies  Allergen Reactions  . Ibuprofen     REACTION: bleed     Home Medications  Prior to Admission medications   Medication Sig Start Date End Date Taking? Authorizing Provider  aspirin 81 MG tablet Take 81 mg by mouth every morning.    Yes [provider]  hydrochlorothiazide (HYDRODIURIL) 25 MG tablet TAKE 1 TABLET EVERY DAY Patient taking differently: Take 25 mg by mouth daily.  04/03/20  Yes Colon Branch, MD  levothyroxine (SYNTHROID) 75 MCG tablet Take 1 tablet (75 mcg total) by mouth daily before breakfast. 09/12/19  Yes Paz, Alda Berthold, MD  Multiple Vitamins-Minerals (ICAPS AREDS 2 PO) Take 1 capsule by mouth 2 (two) times daily.   Yes [provider]  rosuvastatin (CRESTOR) 5 MG tablet Take 1 tablet (5 mg total) by mouth daily. Patient taking differently: Take 5 mg by mouth 3 (three) times a week. No specific days 12/05/17  Yes Paz, Alda Berthold, MD  cyclobenzaprine (FLEXERIL) 5 MG tablet Take 1 tablet (5 mg total) by mouth 3 (three) times daily as needed for muscle spasms. Patient not taking: Reported on 04/30/2020 12/07/19   Colon Branch, MD  fluticasone [facility]) 50 MCG/ACT nasal spray Place 2 sprays into both nostrils daily as needed for allergies or rhinitis. Patient not taking: Reported on 04/30/2020 08/03/17   Colon Branch, MD  polyethylene glycol [facility] Floria Raveling) packet Take 17 g by mouth daily as needed for moderate constipation. Patient not  taking: Reported on 04/29/2020 11/30/16   Cristal Ford, DO  potassium chloride (K-DUR,KLOR-CON) 10 MEQ tablet Take 1 tablet (10 mEq total) by mouth daily. Patient not taking: Reported on 04/30/2020 07/07/17   Colon Branch, MD     Critical care time: Ferol Luz Uriel Horkey ACNP Acute Care Nurse Practitioner [place] Please consult [place] 04/30/2020, 12:24 PM

## 2020-04-30 NOTE — Anesthesia Preprocedure Evaluation (Addendum)
Anesthesia Evaluation  Patient identified by MRN, date of birth, ID band Patient awake    Reviewed: Allergy & Precautions, NPO status , Patient's Chart, lab work & pertinent test results  History of Anesthesia Complications Negative for: history of anesthetic complications  Airway Mallampati: III  TM Distance: >3 FB Neck ROM: Full    Dental  (+) Edentulous Upper, Edentulous Lower   Pulmonary PE   Pulmonary exam normal        Cardiovascular hypertension, Pt. on medications Normal cardiovascular exam     Neuro/Psych negative neurological ROS  negative psych ROS   GI/Hepatic Neg liver ROS, hiatal hernia,  Colonic intussusception  ? Colon cancer    Endo/Other  Hypothyroidism   Renal/GU negative Renal ROS     Musculoskeletal  (+) Arthritis ,   Abdominal   Peds  Hematology  On anticoagulation due to recent b/l PE    Anesthesia Other Findings Covid test negative   Reproductive/Obstetrics                            Anesthesia Physical Anesthesia Plan  ASA: III  Anesthesia Plan: General   Post-op Pain Management:    Induction: Intravenous  PONV Risk Score and Plan: 3 and Treatment may vary due to age or medical condition, Ondansetron and Propofol infusion  Airway Management Planned: Oral ETT  Additional Equipment: None  Intra-op Plan:   Post-operative Plan: Extubation in OR  Informed Consent: I have reviewed the patients History and Physical, chart, labs and discussed the procedure including the risks, benefits and alternatives for the proposed anesthesia with the patient or authorized representative who has indicated his/her understanding and acceptance.     Dental advisory given  Plan Discussed with: CRNA and Anesthesiologist  Anesthesia Plan Comments:        Anesthesia Quick Evaluation

## 2020-04-30 NOTE — Progress Notes (Signed)
Lower extremity venous bilateral study completed.  Preliminary results relayed to RN and Pohkrel, MD.   See CV Proc for preliminary results report.   Darlin Coco, RDMS

## 2020-04-30 NOTE — Progress Notes (Addendum)
[facility] Progress Note     Subjective: Patient resting comfortably in bed this AM and denies abdominal pain. She denies nausea. She is passing flatus and has had a BM since being here. She understands that surgery is fairly risky given age and new acute BL PEs but states very clearly that her goal is get better and be able to eat so that she can get home. Patient lives at home with her grandson but is very independent. She gets around with a walker or cane and is still driving currently. Patient reports her granddaughter will be in later today but is having some car trouble this AM.   Objective: Vital signs in last 24 hours: Temp:  [97.8 F (36.6 C)-98.7 F (37.1 C)] 97.8 F (36.6 C) (09/22 0802) Pulse Rate:  [80-122] 80 (09/22 0802) Resp:  [16-26] 21 (09/22 0802) BP: (126-158)/(75-108) 147/78 (09/22 0802) SpO2:  [85 %-100 %] 98 % (09/22 0802) Weight:  [54.7 kg-55.1 kg] 54.7 kg (09/21 2240) Last BM Date: 04/29/20  Intake/Output from previous day: 09/21 0701 - 09/22 0700 In: 2332.4 [I.V.:524; IV Piggyback:1808.4] Out: -  Intake/Output this shift: No intake/output data recorded.  PE: General: pleasant, WD, elderly female who is laying in bed in NAD HEENT:  Sclera are noninjected.  PERRL.  Ears and nose without any masses or lesions.  Mouth is pink and moist Heart: regular, rate, and rhythm. Palpable radial and pedal pulses bilaterally Lungs: soft rales in bilateral bases.  Respiratory effort nonlabored on nasal cannula  Abd: soft, NT, mildly distended, +BS, palpable RLQ mass MS: all 4 extremities are symmetrical with no cyanosis, clubbing, or edema. Skin: warm and dry with no masses, lesions, or rashes Neuro: Cranial nerves 2-12 grossly intact, sensation grossly intact throughout  Psych: A&Ox3 with an appropriate affect.   Lab Results:  Recent Labs    04/29/20 1204 04/30/20 0046  WBC 9.4 7.4  HGB 15.7* 12.7  HCT 47.2* 38.5  PLT 289 234   BMET Recent  Labs    04/29/20 1204 04/30/20 0046  NA 133* 137  K 3.3* 3.4*  CL 91* 100  CO2 30 27  GLUCOSE 101* 93  BUN 8 <5*  CREATININE 0.66 0.50  CALCIUM 9.0 8.0*   PT/INR No results for input(s): LABPROT, INR in the last 72 hours. CMP     Component Value Date/Time   NA 137 04/30/2020 0046   NA 140 12/06/2016 0000   K 3.4 (L) 04/30/2020 0046   CL 100 04/30/2020 0046   CO2 27 04/30/2020 0046   GLUCOSE 93 04/30/2020 0046   BUN <5 (L) 04/30/2020 0046   BUN 14 12/06/2016 0000   CREATININE 0.50 04/30/2020 0046   CREATININE 0.64 04/25/2020 1620   CALCIUM 8.0 (L) 04/30/2020 0046   PROT 6.0 (L) 04/29/2020 1204   ALBUMIN 3.3 (L) 04/29/2020 1204   AST 19 04/29/2020 1204   ALT 8 04/29/2020 1204   ALKPHOS 46 04/29/2020 1204   BILITOT 0.9 04/29/2020 1204   GFRNONAA >60 04/30/2020 0046   GFRAA >60 04/30/2020 0046   Lipase     Component Value Date/Time   LIPASE 24 04/29/2020 1204       Studies/Results: CT Angio Chest PE W and/or Wo Contrast  Result Date: 04/29/2020 CLINICAL DATA:  Lower abdominal pain, bloody stools. EXAM: CT ANGIOGRAPHY CHEST CT ABDOMEN AND PELVIS WITH CONTRAST TECHNIQUE: Multidetector CT imaging of the chest was performed using the standard protocol during bolus administration of intravenous contrast. Multiplanar CT  image reconstructions and MIPs were obtained to evaluate the vascular anatomy. Multidetector CT imaging of the abdomen and pelvis was performed using the standard protocol during bolus administration of intravenous contrast. CONTRAST:  125mL OMNIPAQUE IOHEXOL 350 MG/ML SOLN COMPARISON:  None. FINDINGS: CTA CHEST FINDINGS Cardiovascular: Filling defects are noted in peripheral branches of both pulmonary arteries consistent with acute pulmonary emboli. Atherosclerosis of thoracic aorta is noted without aneurysm or dissection. Mild cardiomegaly is noted. No pericardial effusion is noted. Mediastinum/Nodes: Large sliding-type hiatal hernia is noted. Thyroid gland  is unremarkable. No adenopathy is noted. Lungs/Pleura: No pneumothorax or pleural effusion is noted. Irregular density is noted peripherally in the left upper lobe with 16 x 11 mm irregular solid density; this may simply represent focal inflammation, but underlying neoplasm cannot be excluded. Follow-up CT scan in 3 weeks is recommended to ensure resolution or stability. Musculoskeletal: No chest wall abnormality. No acute or significant osseous findings. Review of the MIP images confirms the above findings. CT ABDOMEN and PELVIS FINDINGS Hepatobiliary: No focal liver abnormality is seen. No gallstones, gallbladder wall thickening, or biliary dilatation. Pancreas: Unremarkable. No pancreatic ductal dilatation or surrounding inflammatory changes. Spleen: Normal in size without focal abnormality. Adrenals/Urinary Tract: Adrenal glands are unremarkable. Bilateral renal cysts are noted. No hydronephrosis or renal obstruction is noted. No renal or ureteral calculi are noted. Urinary bladder is unremarkable. Stomach/Bowel: There is a large intussusception involving the terminal ileum, right and transverse colon. Possible 19 mm lipoma may be at the lead point. Sigmoid diverticulosis is noted without inflammation. Vascular/Lymphatic: Aortic atherosclerosis. No enlarged abdominal or pelvic lymph nodes. Reproductive: Uterus and bilateral adnexa are unremarkable. Other: No abdominal wall hernia or abnormality. No abdominopelvic ascites. Musculoskeletal: No acute or significant osseous findings. Review of the MIP images confirms the above findings. IMPRESSION: 1. Acute bilateral pulmonary emboli are noted. 2. Irregular density is noted peripherally in the left upper lobe with 16 x 11 mm irregular solid density; this may simply represent focal inflammation, but underlying neoplasm cannot be excluded. Follow-up CT scan in 3 weeks is recommended to ensure resolution or stability. 3. Large intussusception is seen involving the  terminal ileum, right and transverse colon. Possible 19 mm lipoma may be at the lead point. 4. Large sliding-type hiatal hernia is noted. 5. Sigmoid diverticulosis without inflammation. Critical Value/emergent results were called by telephone at the time of interpretation on 04/29/2020 at 3:17 pm to provider [facility] , who verbally acknowledged these results. Aortic Atherosclerosis (ICD10-I70.0). Electronically Signed   By: Marijo Conception M.D.   On: 04/29/2020 15:17   CT ABDOMEN PELVIS W CONTRAST  Result Date: 04/29/2020 CLINICAL DATA:  Lower abdominal pain, bloody stools. EXAM: CT ANGIOGRAPHY CHEST CT ABDOMEN AND PELVIS WITH CONTRAST TECHNIQUE: Multidetector CT imaging of the chest was performed using the standard protocol during bolus administration of intravenous contrast. Multiplanar CT image reconstructions and MIPs were obtained to evaluate the vascular anatomy. Multidetector CT imaging of the abdomen and pelvis was performed using the standard protocol during bolus administration of intravenous contrast. CONTRAST:  114mL OMNIPAQUE IOHEXOL 350 MG/ML SOLN COMPARISON:  None. FINDINGS: CTA CHEST FINDINGS Cardiovascular: Filling defects are noted in peripheral branches of both pulmonary arteries consistent with acute pulmonary emboli. Atherosclerosis of thoracic aorta is noted without aneurysm or dissection. Mild cardiomegaly is noted. No pericardial effusion is noted. Mediastinum/Nodes: Large sliding-type hiatal hernia is noted. Thyroid gland is unremarkable. No adenopathy is noted. Lungs/Pleura: No pneumothorax or pleural effusion is noted. Irregular density is noted  peripherally in the left upper lobe with 16 x 11 mm irregular solid density; this may simply represent focal inflammation, but underlying neoplasm cannot be excluded. Follow-up CT scan in 3 weeks is recommended to ensure resolution or stability. Musculoskeletal: No chest wall abnormality. No acute or significant osseous findings. Review of  the MIP images confirms the above findings. CT ABDOMEN and PELVIS FINDINGS Hepatobiliary: No focal liver abnormality is seen. No gallstones, gallbladder wall thickening, or biliary dilatation. Pancreas: Unremarkable. No pancreatic ductal dilatation or surrounding inflammatory changes. Spleen: Normal in size without focal abnormality. Adrenals/Urinary Tract: Adrenal glands are unremarkable. Bilateral renal cysts are noted. No hydronephrosis or renal obstruction is noted. No renal or ureteral calculi are noted. Urinary bladder is unremarkable. Stomach/Bowel: There is a large intussusception involving the terminal ileum, right and transverse colon. Possible 19 mm lipoma may be at the lead point. Sigmoid diverticulosis is noted without inflammation. Vascular/Lymphatic: Aortic atherosclerosis. No enlarged abdominal or pelvic lymph nodes. Reproductive: Uterus and bilateral adnexa are unremarkable. Other: No abdominal wall hernia or abnormality. No abdominopelvic ascites. Musculoskeletal: No acute or significant osseous findings. Review of the MIP images confirms the above findings. IMPRESSION: 1. Acute bilateral pulmonary emboli are noted. 2. Irregular density is noted peripherally in the left upper lobe with 16 x 11 mm irregular solid density; this may simply represent focal inflammation, but underlying neoplasm cannot be excluded. Follow-up CT scan in 3 weeks is recommended to ensure resolution or stability. 3. Large intussusception is seen involving the terminal ileum, right and transverse colon. Possible 19 mm lipoma may be at the lead point. 4. Large sliding-type hiatal hernia is noted. 5. Sigmoid diverticulosis without inflammation. Critical Value/emergent results were called by telephone at the time of interpretation on 04/29/2020 at 3:17 pm to provider [facility] , who verbally acknowledged these results. Aortic Atherosclerosis (ICD10-I70.0). Electronically Signed   By: Marijo Conception M.D.   On: 04/29/2020  15:17   DG Chest Portable 1 View  Result Date: 04/29/2020 CLINICAL DATA:  Shortness of breath. EXAM: PORTABLE CHEST 1 VIEW COMPARISON:  05/24/2017 FINDINGS: Large hiatal hernia, better characterized on priors. Similar lung hyperinflation. Small left pleural effusion versus scar. Bibasilar (left greater than right) opacities. Mild enlargement of the cardiac silhouette, similar to prior. Aortic atherosclerosis. No acute bony abnormality. IMPRESSION: 1. Small left pleural effusion versus scar. Bibasilar opacities may represent atelectasis, although aspiration and/or pneumonia is not excluded. 2. Hyperinflation/COPD. 3. Mild cardiomegaly. Electronically Signed   By: Margaretha Sheffield MD   On: 04/29/2020 12:41    Anti-infectives: Anti-infectives (From admission, onward)   Start     Dose/Rate Route Frequency Ordered Stop   04/29/20 2030  cefTRIAXone (ROCEPHIN) 1 g in sodium chloride 0.9 % 100 mL IVPB        1 g 200 mL/hr over 30 Minutes Intravenous Every 24 hours 04/29/20 2019     04/29/20 2030  azithromycin (ZITHROMAX) 500 mg in sodium chloride 0.9 % 250 mL IVPB        500 mg 250 mL/hr over 60 Minutes Intravenous Every 24 hours 04/29/20 2019 05/04/20 2029   04/29/20 2030  metroNIDAZOLE (FLAGYL) IVPB 500 mg        500 mg 100 mL/hr over 60 Minutes Intravenous Every 8 hours 04/29/20 2019     04/29/20 1715  metroNIDAZOLE (FLAGYL) IVPB 500 mg        500 mg 100 mL/hr over 60 Minutes Intravenous  Once 04/29/20 1701 04/29/20 1904   04/29/20 1545  cefTRIAXone (ROCEPHIN) 1 g in sodium chloride 0.9 % 100 mL IVPB        1 g 200 mL/hr over 30 Minutes Intravenous  Once 04/29/20 1541 04/29/20 1626   04/29/20 1545  azithromycin (ZITHROMAX) 500 mg in sodium chloride 0.9 % 250 mL IVPB        500 mg 250 mL/hr over 60 Minutes Intravenous  Once 04/29/20 1541 04/29/20 1757       Assessment/Plan HTN Hypothyroidism HLD Hypokalemia and hypomagnesemia - replace with goal of K >4.0 and Mg >2.0   Bilateral PE  with hypoxia - on supplemental O2, ok to start on heparin gtt today but would plan to stop around 0400 tomorrow for possible OR LUL mass on CT - PNA vs neoplasm, repeat CT in 3-4 weeks recommended  Intussusception  - patient has intussusception of the right and transverse colon - probable mass causing a lead point - abdominal exam benign this AM and patient is having some bowel function - ok to have CLD today, no surgical intervention planned for today - I think that patient likely will require surgery for this given goal of getting better and getting home but this can be done urgently in the next 1-2 days rather than emergently - I would recommend asking pulmonary and cards to see for pre-op clearance - I consulted palliative medicine to ensure [place] are clearly outlined prior to proceeding with surgical intervention   FEN: CLD, IVF  VTE: SCDs, ok to start heparin gtt but will need to hold after 0400 tomorrow AM for possible OR ID: rocephin/flagyl/azithromycin 9/21>>  LOS: 1 day    Norm Parcel , [facility] 04/30/2020, 9:34 AM Please see Amion for pager number during day hours 7:00am-4:30pm

## 2020-04-30 NOTE — NC FL2 (Signed)
[place] LEVEL OF CARE SCREENING TOOL     IDENTIFICATION  Patient Name: Ariel Medina Birthdate: 15-Apr-1928 Sex: female Admission Date (Current Location): 04/29/2020  [facility] and [place] Number:  Herbalist and Address:  [facility], [place] [address] [place], [place] [address]      Provider Number: [id number]  Attending Physician Name and Address:  Flora Lipps, MD  Relative Name and Phone Number:  [name] (grandaughter) [phone]    Current Level of Care: Hospital Recommended Level of Care: [place] Prior Approval Number:    Date Approved/Denied:   PASRR Number: [id number]  Discharge Plan: SNF    Current Diagnoses: Patient Active Problem List   Diagnosis Date Noted   Goals of care, counseling/discussion    Palliative care by specialist    DNR (do not resuscitate) discussion    Intussusception ([place]) 04/29/2020   Pulmonary embolism, bilateral ([place]) 04/29/2020   GI bleed 04/29/2020   Abnormal CT scan of lung 04/29/2020   Hypoxia 04/29/2020   Acute pulmonary embolism ([place]) 04/29/2020   Pre-ulcerative calluses 01/25/2020   Essential hypertension 11/29/2016   Closed fracture of multiple pubic rami ([place]) 11/29/2016   Fall    Closed fracture of left pubis ([place]) 11/28/2016   Hypokalemia 11/28/2016   Hearing loss 09/19/2015   PCP NOTES >>> 05/13/2015   Allergic rhinitis 10/24/2014   Annual physical exam 04/14/2011   BACK PAIN, shoulder pain 12/11/2007   Osteoporosis 10/20/2007   NEOPLASM, MALIGNANT, BREAST, HX OF 10/20/2007   Hypothyroidism 01/30/2007   Hyperlipidemia 01/30/2007   HTN and mild edema 01/30/2007   OSTEOARTHRITIS 01/30/2007    Orientation RESPIRATION BLADDER Height & Weight     Time, Place, Self, Situation  Normal External catheter Weight: 120 lb 9.5 oz (54.7 kg) Height:  5\' 2"  (035.5 cm)  BEHAVIORAL SYMPTOMS/MOOD NEUROLOGICAL BOWEL NUTRITION STATUS       Incontinent Diet (See DC summary)  AMBULATORY STATUS COMMUNICATION OF NEEDS Skin   Extensive Assist Verbally Normal                       Personal Care Assistance Level of Assistance  Bathing, Feeding, Dressing Bathing Assistance: Limited assistance Feeding assistance: Limited assistance Dressing Assistance: Limited assistance     Functional Limitations Info  Hearing   Hearing Info: Impaired      SPECIAL CARE FACTORS FREQUENCY  PT (By licensed PT), OT (By licensed OT)     PT Frequency: 5X per week OT Frequency: 5X per week            Contractures      Additional Factors Info  Code Status, Allergies Code Status Info: FULL Allergies Info: Ibuprofen           Current Medications (04/30/2020):  This is the current hospital active medication list Current Facility-Administered Medications  Medication Dose Route Frequency Provider Last Rate Last Admin   azithromycin (ZITHROMAX) 500 mg in sodium chloride 0.9 % 250 mL IVPB  500 mg Intravenous Q24H Chotiner, Yevonne Aline, MD   Stopped at 04/29/20 2133   cefTRIAXone (ROCEPHIN) 1 g in sodium chloride 0.9 % 100 mL IVPB  1 g Intravenous Q24H Chotiner, Yevonne Aline, MD   Stopped at 04/29/20 2106   heparin ADULT infusion 100 units/mL (25000 units/230mL sodium chloride 0.45%)  800 Units/hr Intravenous Continuous Wendee Beavers, RPH 8 mL/hr at 04/30/20 1315 800 Units/hr at 04/30/20 1315   hydrALAZINE (APRESOLINE) injection 10  mg  10 mg Intravenous Q6H PRN Pokhrel, Laxman, MD       HYDROmorphone (DILAUDID) injection 0.5 mg  0.5 mg Intravenous Q3H PRN Chotiner, Yevonne Aline, MD       influenza vaccine adjuvanted (FLUAD) injection 0.5 mL  0.5 mL Intramuscular Prior to discharge Chotiner, Yevonne Aline, MD       lactated ringers infusion   Intravenous Continuous Chotiner, Yevonne Aline, MD 75 mL/hr at 04/30/20 1241 New Bag at 04/30/20 1241   magnesium sulfate IVPB 2 g 50 mL  2 g Intravenous Once Pokhrel, Laxman, MD       metroNIDAZOLE  (FLAGYL) IVPB 500 mg  500 mg Intravenous Q8H Chotiner, Yevonne Aline, MD 100 mL/hr at 04/30/20 1423 500 mg at 04/30/20 1423   ondansetron (ZOFRAN) tablet 4 mg  4 mg Oral Q6H PRN Chotiner, Yevonne Aline, MD       Or   ondansetron (ZOFRAN) injection 4 mg  4 mg Intravenous Q6H PRN Chotiner, Yevonne Aline, MD         Discharge Medications: Please see discharge summary for a list of discharge medications.  Relevant Imaging Results:  Relevant Lab Results:   Additional Information ssn:[phone]  Glennon Hamilton, Student-Social Work

## 2020-04-30 NOTE — Progress Notes (Signed)
ANTICOAGULATION CONSULT NOTE - Initial Consult  Pharmacy Consult for Heparin  Indication: bilateral pulmonary embolus  Allergies  Allergen Reactions  . Ibuprofen     REACTION: bleed    Patient Measurements: Height: 5\' 2"  (157.5 cm) Weight: 54.7 kg (120 lb 9.5 oz) IBW/kg (Calculated) : 50.1 Heparin Dosing Weight: 54.7 kg  Vital Signs: Temp: 97.8 F (36.6 C) (09/22 0802) Temp Source: Oral (09/22 0802) BP: 147/78 (09/22 0802) Pulse Rate: 80 (09/22 0802)  Labs: Recent Labs    04/29/20 1204 04/29/20 1501 04/30/20 0046  HGB 15.7*  --  12.7  HCT 47.2*  --  38.5  PLT 289  --  234  CREATININE 0.66  --  0.50  TROPONINIHS 9 9  --     Estimated Creatinine Clearance: 37 mL/min (by C-G formula based on SCr of 0.5 mg/dL).   Medical History: Past Medical History:  Diagnosis Date  . Breast CA ([place])    left breast-invasive ductal ca stage 1-stopped arimidex 2008  . Diverticulitis of colon   . Hiatal hernia   . Hyperlipidemia   . Hypertension   . Hypothyroidism   . Osteoarthritis   . Osteopenia    DEXA 7/02  . Syncope 2005   CT head (-), ECHO essent. neg, stress test (-), carotid u/s (-)    Assessment: 86 y.o female presented to the ED on 9/21 with complaints of worsening diarrhea for 2 weeks with some bright red stool Diagnosed with  acute bilateral PE per CT angio 9/21 in ED.  Had hematochezia  thus did not start antiocoagulation until 9/22.   Pharmacy consulted to start IV Heparin for VTE treatment.   Patient has Intusscuception with mild bleeding;  needs surgery for intussuception of terminal ileum and right -sided colon. Surgery is planned 9/23,  Heparin gtt to stop at 0400 9/23.   No active bleeding 9/22,  Hgb 15.7>12.7, pltc wnl stable.   Goal of Therapy:  Heparin level 0.3-0.7 units/ml Monitor platelets by anticoagulation protocol: Yes   Plan:  Heparin bolus 3000 units x1 Heparin drip 800 units/hr Check heparin level 8 hours after start of heparin Daily  heparin level and CBC Monitor for bleeding -Surgery planned for 9/23,  heparin drip to stop at 0400 on 9/23.  Will need to f/u for restart of heparin drip vs.  DOAC vs Heparin bridge to warfarin for bilateral PE.  Thank you for allowing pharmacy to be part of this patients care team. Nicole Cella, [place] Pharmacist [phone] Please check AMION for all [place] phone numbers After 10:00 PM, call [place] [phone]\   Arman Bogus 04/30/2020,12:20 PM

## 2020-04-30 NOTE — TOC Initial Note (Signed)
Transition of Care [facility]) - Initial/Assessment Note    Patient Details  Name: Ariel Medina MRN: [id number] Date of Birth: 27-Aug-1927  Transition of [facility] CM/SW Contact:    Vinie Sill, [place] Phone Number: 04/30/2020, 5:55 PM  Clinical Narrative:                  CSW visit with patient at bedside along with her granddaughter,Carolyn. CSW introduced self and explained role. CSW discussed with patient PT recommendation of short term rehab at [facility] before discharged home. Patient states she was agreeable to rehab at [facility]. CSW explained the SNF process. Patient states her preferred SNF is [facility]. CSW was given permission to send referrals to other SMFs as back up to [facility]Patient states she has received covid vaccine. No reported questions or concerns at this time.   CSW will provide bed offers when available. CSW will continue to follow and assist with discharge planning.   Thurmond Butts, MSW, [place] Clinical Social Worker   Expected Discharge Plan: Skilled Nursing Facility Barriers to Discharge: SNF Pending bed offer   Patient Goals and CMS Choice        Expected Discharge Plan and Services Expected Discharge Plan: [place] In-house Referral: Clinical Social Work     Living arrangements for the past 2 months: [place]                                      Prior Living Arrangements/Services Living arrangements for the past 2 months: Single Family Home Lives with:: Self (adult -grandson) Patient language and need for interpreter reviewed:: No        Need for Family Participation in Patient Care: Yes (Comment) Care giver support system in place?: Yes (comment)   Criminal Activity/Legal Involvement Pertinent to Current Situation/Hospitalization: No - Comment as needed  Activities of Daily Living Home Assistive Devices/Equipment: Cane (specify quad or straight), Dentures (specify type) ADL Screening (condition at time of  admission) Patient's cognitive ability adequate to safely complete daily activities?: Yes Is the patient deaf or have difficulty hearing?: No Does the patient have difficulty seeing, even when wearing glasses/contacts?: No Does the patient have difficulty concentrating, remembering, or making decisions?: No Patient able to express need for assistance with ADLs?: Yes Does the patient have difficulty dressing or bathing?: No Independently performs ADLs?: Yes (appropriate for developmental age) Does the patient have difficulty walking or climbing stairs?: Yes Weakness of Legs: Both Weakness of Arms/Hands: None  Permission Sought/Granted Permission sought to share information with : Family Supports Permission granted to share information with : Yes, Verbal Permission Granted  Share Information with NAME: Serafina Royals  Permission granted to share info w AGENCY: SNFs  Permission granted to share info w Relationship: granddaughter  Permission granted to share info w Contact Information: [phone]  Emotional Assessment Appearance:: Appears stated age Attitude/Demeanor/Rapport: Engaged Affect (typically observed): Accepting, Pleasant, Appropriate Orientation: : Oriented to Self, Oriented to Place, Oriented to  Time, Oriented to Situation Alcohol / Substance Use: Not Applicable Psych Involvement: No (comment)  Admission diagnosis:  Intussusception ([place]) [K56.1] Bilateral pulmonary embolism (HCC) [I26.99] Acute pulmonary embolism, unspecified pulmonary embolism type, unspecified whether acute cor pulmonale present ([place]) [I26.99] Patient Active Problem List   Diagnosis Date Noted  . Goals of care, counseling/discussion   . Palliative care by specialist   . DNR (do not resuscitate) discussion   .  Intussusception ([place]) 04/29/2020  . Pulmonary embolism, bilateral ([place]) 04/29/2020  . GI bleed 04/29/2020  . Abnormal CT scan of lung 04/29/2020  . Hypoxia 04/29/2020  . Acute pulmonary  embolism ([place]) 04/29/2020  . Pre-ulcerative calluses 01/25/2020  . Essential hypertension 11/29/2016  . Closed fracture of multiple pubic rami ([place]) 11/29/2016  . Fall   . Closed fracture of left pubis ([place]) 11/28/2016  . Hypokalemia 11/28/2016  . Hearing loss 09/19/2015  . PCP NOTES >>> 05/13/2015  . Allergic rhinitis 10/24/2014  . Annual physical exam 04/14/2011  . BACK PAIN, shoulder pain 12/11/2007  . Osteoporosis 10/20/2007  . NEOPLASM, MALIGNANT, BREAST, HX OF 10/20/2007  . Hypothyroidism 01/30/2007  . Hyperlipidemia 01/30/2007  . HTN and mild edema 01/30/2007  . OSTEOARTHRITIS 01/30/2007   PCP:  Colon Branch, MD Pharmacy:   [facility] [address] [place], [place] [address] [place] [address] Phone: [phone] Fax: [facility] [place], [place] [address] Phone: [phone] Fax: [phone]     Social Determinants of Health (SDOH) Interventions    Readmission Risk Interventions No flowsheet data found.

## 2020-04-30 NOTE — Consult Note (Signed)
Consultation Note Date: 04/30/2020   Patient Name: Ariel Medina  DOB: June 22, 1928  MRN: [id number]  Age / Sex: 86 y.o., female  PCP: Colon Branch, MD Referring Physician: Flora Lipps, MD  Reason for Consultation: Establishing goals of care and Psychosocial/spiritual support  HPI/Patient Profile: 86 y.o. female  with past medical history of breast cancer-left breast invasive ductal stage I, stopped Arimidex 10/17/06, diverticulitis, hiatal hernia, HTN/HLD, hypothyroidism, osteoarthritis, osteopenia, CKD, admitted on 04/29/2020 with acute pulmonary embolism bilateral, intussusception.   Clinical Assessment and Goals of Care: I have reviewed medical records including EPIC notes, labs and imaging, examined the patient and met at bedside with patient  to discuss diagnosis prognosis, GOC, EOL wishes, disposition and options.   I introduced Palliative Medicine as specialized medical care for people living with serious illness. It focuses on providing relief from the symptoms and stress of a serious illness.   We discussed a brief life review of the patient.  Ariel Medina spouse died in 51.  She lives in her home home with her adult grandson.  He assists with household chores, getting grocery pickup and such.  He is employed full-time.  Ms. Owens Medina worked most of her life at the [employer] in [place].  Her sister who is aged 34 lives next door and helps with bathing as needed.  She is relatively independent, managing her own laundry and light housekeeping.  She uses a cane or walker since she fell and broke her pelvis about 3 years ago.  As far as functional and nutritional status, she is independent with most ADLs, her 30 year old sister who lives next door helps with bathing.  Her grandson Ariel Medina who lives in her home helps with grocery pickup and yard work.  She is able to do her own light housekeeping  and laundry.  Decent appetite with no weight loss mentioned.    We discussed her current illness and what it means in the larger context of her on-going co-morbidities.  Natural disease trajectory and expectations at EOL were discussed.  I attempted to elicit values and goals of care important to the patient.  Ariel Medina tells me that her goal is to get better/stronger and return to her own home.  She would want any and all treatments as offered at this time, but would not want long term life support.   Advanced directives, concepts specific to code status, were considered and discussed.  See below.   Questions and concerns were addressed.  The family was encouraged to call with questions or concerns.   Conference with attending, surgery, bedside nursing staff and [facility] team related to patient condition, needs, goals of care, disposition.  HCPOA  NEXT OF KIN -Ms. Owens Medina names her granddaughter, Ariel Medina, as her Ambulance person.  Her husband died in 18-Oct-1991, she has 1 living son, but his health is not good.    SUMMARY OF RECOMMENDATIONS   Continue to treat the treatable Agreeable to surgery if qualified Agreeable to short-term rehab, [facility] is choice,  if qualified. Full scope/full code, but would not want to be on long-term life support Continue CODE STATUS discussions  Code Status/Advance Care Planning:  Full code -Ariel Medina tells me that she has an "living well" at home, her granddaughter/surrogate, Ariel Medina has a copy of this document.  She tells me that she would want attempted CPR, and life support, but not long-term.  We briefly discussed preferred place of death.  Symptom Management:   Per hospitalist, no additional needs at this time.  Palliative Prophylaxis:   Frequent Pain Assessment, Oral Care and Turn Reposition  Additional Recommendations (Limitations, Scope, Preferences):  Full Scope Treatment  Psycho-social/Spiritual:   Desire for further  Chaplaincy support:no  Additional Recommendations: Caregiving  Support/Resources  Prognosis:   Unable to determine, based on outcomes.  Depending on response to surgery, PE treatment, further work-up of left upper lobe mass.  Discharge Planning: To be determined, is agreeable to short-term rehab if needed.  [facility] choice.  She has been there in the past, about 3 years ago.      Primary Diagnoses: Present on Admission: . Intussusception ([place]) . Hypokalemia . Essential hypertension . Bilateral pulmonary embolism ([place])   I have reviewed the medical record, interviewed the patient and family, and examined the patient. The following aspects are pertinent.  Past Medical History:  Diagnosis Date  . Breast CA ([place])    left breast-invasive ductal ca stage 1-stopped arimidex 2008  . Diverticulitis of colon   . Hiatal hernia   . Hyperlipidemia   . Hypertension   . Hypothyroidism   . Osteoarthritis   . Osteopenia    DEXA 7/02  . Syncope 2005   CT head (-), ECHO essent. neg, stress test (-), carotid u/s (-)   Social History   Socioeconomic History  . Marital status: Widowed    Spouse name: Not on file  . Number of children: 2  . Years of education: Not on file  . Highest education level: Not on file  Occupational History  . Occupation: n/a    Employer: RETIRED  Tobacco Use  . Smoking status: Never Smoker  . Smokeless tobacco: Never Used  Vaping Use  . Vaping Use: Never used  Substance and Sexual Activity  . Alcohol use: No  . Drug use: No  . Sexual activity: Not Currently  Other Topics Concern  . Not on file  Social History Narrative   Lost a son 6   G-son  live w/ her from time to time, not permanently   (Ariel Medina is her son and used to be my pt,  h/o etoh)   Sister live next door , does help the pt    G-son @ [employer] , G-daughter @ [employer] since 1993   still drives, short distances, typically not at night.   Social Determinants of  Health   Financial Resource Strain: Low Risk   . Difficulty of Paying Living Expenses: Not hard at all  Food Insecurity: No Food Insecurity  . Worried About Charity fundraiser in the Last Year: Never true  . Ran Out of Food in the Last Year: Never true  Transportation Needs: No Transportation Needs  . Lack of Transportation (Medical): No  . Lack of Transportation (Non-Medical): No  Physical Activity:   . Days of Exercise per Week: Not on file  . Minutes of Exercise per Session: Not on file  Stress: No Stress Concern Present  . Feeling of Stress : Only a  little  Social Connections:   . Frequency of Communication with Friends and Family: Not on file  . Frequency of Social Gatherings with Friends and Family: Not on file  . Attends Religious Services: Not on file  . Active Member of Clubs or Organizations: Not on file  . Attends Archivist Meetings: Not on file  . Marital Status: Not on file   Family History  Problem Relation Age of Onset  . Heart attack Brother 61  . Diabetes Mother   . Stroke Father 6  . Stroke Sister 36  . Colon cancer Neg Hx   . Breast cancer Neg Hx    Scheduled Meds: Continuous Infusions: . azithromycin Stopped (04/29/20 2133)  . cefTRIAXone (ROCEPHIN)  IV Stopped (04/29/20 2106)  . heparin 800 Units/hr (04/30/20 1315)  . lactated ringers 75 mL/hr at 04/30/20 1241  . magnesium sulfate bolus IVPB    . metronidazole Stopped (04/30/20 0530)   PRN Meds:.hydrALAZINE, HYDROmorphone (DILAUDID) injection, influenza vaccine adjuvanted, ondansetron **OR** ondansetron (ZOFRAN) IV Medications Prior to Admission:  Prior to Admission medications   Medication Sig Start Date End Date Taking? Authorizing Provider  aspirin 81 MG tablet Take 81 mg by mouth every morning.    Yes [provider]  hydrochlorothiazide (HYDRODIURIL) 25 MG tablet TAKE 1 TABLET EVERY DAY Patient taking differently: Take 25 mg by mouth daily.  04/03/20  Yes Colon Branch, MD   levothyroxine (SYNTHROID) 75 MCG tablet Take 1 tablet (75 mcg total) by mouth daily before breakfast. 09/12/19  Yes Paz, Alda Berthold, MD  Multiple Vitamins-Minerals (ICAPS AREDS 2 PO) Take 1 capsule by mouth 2 (two) times daily.   Yes [provider]  rosuvastatin (CRESTOR) 5 MG tablet Take 1 tablet (5 mg total) by mouth daily. Patient taking differently: Take 5 mg by mouth 3 (three) times a week. No specific days 12/05/17  Yes Paz, Alda Berthold, MD  cyclobenzaprine (FLEXERIL) 5 MG tablet Take 1 tablet (5 mg total) by mouth 3 (three) times daily as needed for muscle spasms. Patient not taking: Reported on 04/30/2020 12/07/19   Colon Branch, MD  fluticasone [facility]) 50 MCG/ACT nasal spray Place 2 sprays into both nostrils daily as needed for allergies or rhinitis. Patient not taking: Reported on 04/30/2020 08/03/17   Colon Branch, MD  polyethylene glycol [facility] Floria Raveling) packet Take 17 g by mouth daily as needed for moderate constipation. Patient not taking: Reported on 04/29/2020 11/30/16   Cristal Ford, DO  potassium chloride (K-DUR,KLOR-CON) 10 MEQ tablet Take 1 tablet (10 mEq total) by mouth daily. Patient not taking: Reported on 04/30/2020 07/07/17   Colon Branch, MD   Allergies  Allergen Reactions  . Ibuprofen     REACTION: bleed   Review of Systems  Unable to perform ROS: Age    Physical Exam Vitals and nursing note reviewed.  Constitutional:      General: She is not in acute distress.    Appearance: Normal appearance. She is not ill-appearing.  HENT:     Head: Normocephalic and atraumatic.     Mouth/Throat:     Mouth: Mucous membranes are moist.  Cardiovascular:     Rate and Rhythm: Normal rate.  Pulmonary:     Effort: Pulmonary effort is normal. No respiratory distress.  Abdominal:     Comments: Mild distention  Skin:    General: Skin is warm and dry.  Neurological:     Mental Status: She is alert and oriented to person,  place, and time.  Psychiatric:        Mood and  Affect: Mood normal.        Behavior: Behavior normal.     Vital Signs: BP (!) 147/78 (BP Location: Right Arm)   Pulse 80   Temp 97.8 F (36.6 C) (Oral)   Resp (!) 21   Ht 5' 2"  (1.575 m)   Wt 54.7 kg   SpO2 98%   BMI 22.06 kg/m  Pain Scale: 0-10   Pain Score: 2    SpO2: SpO2: 98 % O2 Device:SpO2: 98 % O2 Flow Rate: .O2 Flow Rate (L/min): 2 L/min  IO: Intake/output summary:   Intake/Output Summary (Last 24 hours) at 04/30/2020 1355 Last data filed at 04/30/2020 0600 Gross per 24 hour  Intake 2332.41 ml  Output --  Net 2332.41 ml    LBM: Last BM Date: 04/29/20 Baseline Weight: Weight: 55.1 kg Most recent weight: Weight: 54.7 kg     Palliative Assessment/Data:   Flowsheet Rows     Most Recent Value  Intake Tab  Referral Department Hospitalist  Unit at Time of Referral Med/Surg Unit  Palliative Care Primary Diagnosis Other (Comment)  Date Notified 04/30/20  Palliative Care Type New Palliative care  Reason for referral Clarify Goals of Care  Date of Admission 04/29/20  Date first seen by Palliative Care 04/30/20  # of days Palliative referral response time 0 Day(s)  # of days IP prior to Palliative referral 1  Clinical Assessment  Palliative Performance Scale Score 50%  Pain Max last 24 hours Not able to report  Pain Min Last 24 hours Not able to report  Dyspnea Max Last 24 Hours Not able to report  Dyspnea Min Last 24 hours Not able to report  Psychosocial & Spiritual Assessment  Palliative Care Outcomes      Time In: 1040 Time Out: 1150 Time Total: 70 minutes  Greater than 50%  of this time was spent counseling and coordinating care related to the above assessment and plan.  Signed by: Drue Novel, NP   Please contact Palliative Medicine Team phone at [phone] for questions and concerns.  For individual provider: See Shea Evans

## 2020-04-30 NOTE — Progress Notes (Signed)
PROGRESS NOTE  Ariel Medina HLK:[id number] DOB: 10/20/1927 DOA: 04/29/2020 PCP: Colon Branch, MD   LOS: 1 day   Brief narrative: As per HPI,  Ariel Medina is a 86 y.o. female with medical history significant for history of breast cancer, diverticulitis, hyperlipidemia, hypertension, hypothyroidism, osteoarthritis, osteopenia, CKD, who presented to the emergency department with complaints of worsening diarrhea for 2 weeks with some bright red stool.   Patient also had decreased appetite .  She was seen a few days ago in her PCP office for evaluation and had negative stool cultures and labs at that time.  Patient was revisited by PCP again for heme positive stools and was also noted to be hypoxic. In the emergency room to have bilateral PEs on CT angiography of her chest.  She also had a left upper lobe opacity which could represent a focal inflammation versus a underlying neoplasm.  She was started on empiric antibiotics for possible pneumonia.  She was also noted to have hematochezia and therefore was not placed on anticoagulation.  CT of her abdomen revealed intussusception of her terminal ileum and right-sided colon.  General surgery and GI were consulted.  Assessment/Plan:  Principal Problem:   Pulmonary embolism, bilateral (HCC) Active Problems:   Hypokalemia   Essential hypertension   Intussusception (HCC)   GI bleed   Abnormal CT scan of lung   Hypoxia   Bilateral pulmonary embolism (HCC)  Intussusception with mild bleeding. CT scan of the abdomen with likely mass guarding distention.  Patient will need a surgical intervention.  Okay for anticoagulation as per surgery.  General surgery requesting cardiac and pulmonary evaluation prior to operative intervention.  I spoke with the patient in detail and she wants to be full code   Hypokalemia Mild hypokalemia.  We will continue to replenish with oral potassium.  Mild hypomagnesemia.  We will continue to replace.  Will give IV  magnesium sulfate x1.  Check CBC BMP in a.m.    Essential hypertension On IV antihypertensives as needed for blood pressure greater than 160/90  GI bleed  With anticipation.  History of diverticulitis.  Surgery on board.  No active bleeding today.  Continue to monitor hemoglobin.    Abnormal CT scan of lung Patient has a left upper lobe density which could be a focal inflammation versus neoplasm.  On empiric antibiotic for pneumonia  Patient will need repeat CT scan in 3 to 4 weeks to document improvement of opacity.  Pulmonary will be consulted as well due to oxygen need. pulmonary embolism with mass lesion and for pulmonary clearance prior to surgical intervention tomorrow.  Acute hypoxic respiratory failure secondary to pulmonary embolism.  Possible mass versus pneumonia.  Continue antibiotics.  Continue oxygen supplementation.  We will start the patient on current drip.  We will hold heparin drip after midnight.  Ethics.  Palliative care has been consulted for goals of care.    DVT prophylaxis: SCDs Start: 04/29/20 2019   Code Status: Full code.  Spoke with the patient at length regarding this and she wishes full code.  Family Communication:   I spoke with the patient's daughter on the phone and updated her about the clinical condition of the patient and plan for further work-up.  Status is: Inpatient  Remains inpatient appropriate because:IV treatments appropriate due to intensity of illness or inability to take PO, Inpatient level of care appropriate due to severity of illness and mass, likely need intervention   Dispo: The patient is from:  Home              Anticipated d/c is to: Home              Anticipated d/c date is: 3 days              Patient currently is not medically stable to d/c.  Consultants:  General surgery  Pulmonary   Cardiology  Palliative care  Procedures:  None  Antibiotics:  . Rocephin, IV, azithromycin, metronidazole  Anti-infectives  (From admission, onward)   Start     Dose/Rate Route Frequency Ordered Stop   04/29/20 2030  cefTRIAXone (ROCEPHIN) 1 g in sodium chloride 0.9 % 100 mL IVPB        1 g 200 mL/hr over 30 Minutes Intravenous Every 24 hours 04/29/20 2019     04/29/20 2030  azithromycin (ZITHROMAX) 500 mg in sodium chloride 0.9 % 250 mL IVPB        500 mg 250 mL/hr over 60 Minutes Intravenous Every 24 hours 04/29/20 2019 05/04/20 2029   04/29/20 2030  metroNIDAZOLE (FLAGYL) IVPB 500 mg        500 mg 100 mL/hr over 60 Minutes Intravenous Every 8 hours 04/29/20 2019     04/29/20 1715  metroNIDAZOLE (FLAGYL) IVPB 500 mg        500 mg 100 mL/hr over 60 Minutes Intravenous  Once 04/29/20 1701 04/29/20 1904   04/29/20 1545  cefTRIAXone (ROCEPHIN) 1 g in sodium chloride 0.9 % 100 mL IVPB        1 g 200 mL/hr over 30 Minutes Intravenous  Once 04/29/20 1541 04/29/20 1626   04/29/20 1545  azithromycin (ZITHROMAX) 500 mg in sodium chloride 0.9 % 250 mL IVPB        500 mg 250 mL/hr over 60 Minutes Intravenous  Once 04/29/20 1541 04/29/20 1757     Subjective: Today, patient was seen and examined at bedside. Denies abdominal pain, nausea, vomiting, chest pain. Complains of mild shortness of breath.   Objective: Vitals:   04/30/20 0427 04/30/20 0802  BP: 131/75 (!) 147/78  Pulse: 83 80  Resp: (!) 26 (!) 21  Temp: 98.2 F (36.8 C) 97.8 F (36.6 C)  SpO2: 98% 98%    Intake/Output Summary (Last 24 hours) at 04/30/2020 1146 Last data filed at 04/30/2020 0600 Gross per 24 hour  Intake 2332.41 ml  Output --  Net 2332.41 ml   Filed Weights   04/29/20 1145 04/29/20 2240  Weight: 55.1 kg 54.7 kg   Body mass index is 22.06 kg/m.   Physical Exam:  GENERAL: Patient is alert awake and oriented. Not in obvious distress.on nasal canula oxygen.  Communicative. HENT: No scleral pallor or icterus. Pupils equally reactive to light. Oral mucosa is moist NECK: is supple, no gross swelling noted. CHEST:   Diminished  breath sounds bilaterally. CVS: S1 and S2 heard, no murmur. Regular rate and rhythm.  ABDOMEN: Soft, mildly distended, vague mass in the right lower quadrant. EXTREMITIES: No edema. CNS: Cranial nerves are intact. No focal motor deficits. SKIN: warm and dry without rashes.   Data Review: I have personally reviewed the following laboratory data and studies,  CBC: Recent Labs  Lab 04/25/20 1620 04/29/20 1204 04/30/20 0046  WBC 7.5 9.4 7.4  NEUTROABS 5,190 7.3  --   HGB 15.6* 15.7* 12.7  HCT 45.9* 47.2* 38.5  MCV 91.6 93.1 93.0  PLT 257 289 696   Basic Metabolic Panel: Recent Labs  Lab 04/25/20  1620 04/29/20 1204 04/30/20 0046  NA 135 133* 137  K 4.0 3.3* 3.4*  CL 92* 91* 100  CO2 32 30 27  GLUCOSE 95 101* 93  BUN 9 8 <5*  CREATININE 0.64 0.66 0.50  CALCIUM 9.6 9.0 8.0*  MG  --   --  1.6*   Liver Function Tests: Recent Labs  Lab 04/25/20 1620 04/29/20 1204  AST 13 19  ALT 4* 8  ALKPHOS  --  46  BILITOT 0.7 0.9  PROT 6.0* 6.0*  ALBUMIN  --  3.3*   Recent Labs  Lab 04/25/20 1620 04/29/20 1204  LIPASE 8 24   No results for input(s): AMMONIA in the last 168 hours. Cardiac Enzymes: No results for input(s): CKTOTAL, CKMB, CKMBINDEX, TROPONINI in the last 168 hours. BNP (last 3 results) No results for input(s): BNP in the last 8760 hours.  ProBNP (last 3 results) No results for input(s): PROBNP in the last 8760 hours.  CBG: No results for input(s): GLUCAP in the last 168 hours. Recent Results (from the past 240 hour(s))  SARS Coronavirus 2 by RT PCR (hospital order, performed in [facility] hospital lab) Nasopharyngeal Nasopharyngeal Swab     Status: None   Collection Time: 04/29/20  3:01 PM   Specimen: Nasopharyngeal Swab  Result Value Ref Range Status   SARS Coronavirus 2 NEGATIVE NEGATIVE Final    Comment: (NOTE) SARS-CoV-2 target nucleic acids are NOT DETECTED.  The SARS-CoV-2 RNA is generally detectable in upper and lower respiratory specimens  during the acute phase of infection. The lowest concentration of SARS-CoV-2 viral copies this assay can detect is 250 copies / mL. A negative result does not preclude SARS-CoV-2 infection and should not be used as the sole basis for treatment or other patient management decisions.  A negative result may occur with improper specimen collection / handling, submission of specimen other than nasopharyngeal swab, presence of viral mutation(s) within the areas targeted by this assay, and inadequate number of viral copies (<250 copies / mL). A negative result must be combined with clinical observations, patient history, and epidemiological information.  Fact Sheet for Patients:   StrictlyIdeas.no  Fact Sheet for Healthcare Providers: BankingDealers.co.za  This test is not yet approved or  cleared by the [place] FDA and has been authorized for detection and/or diagnosis of SARS-CoV-2 by FDA under an Emergency Use Authorization (EUA).  This EUA will remain in effect (meaning this test can be used) for the duration of the COVID-19 declaration under Section 564(b)(1) of the Act, 21 U.S.C. section 360bbb-3(b)(1), unless the authorization is terminated or revoked sooner.  Performed at [facility], [address] [place], [place] [address]      Studies: CT Angio Chest PE W and/or Wo Contrast  Result Date: 04/29/2020 CLINICAL DATA:  Lower abdominal pain, bloody stools. EXAM: CT ANGIOGRAPHY CHEST CT ABDOMEN AND PELVIS WITH CONTRAST TECHNIQUE: Multidetector CT imaging of the chest was performed using the standard protocol during bolus administration of intravenous contrast. Multiplanar CT image reconstructions and MIPs were obtained to evaluate the vascular anatomy. Multidetector CT imaging of the abdomen and pelvis was performed using the standard protocol during bolus administration of intravenous contrast. CONTRAST:  162mL OMNIPAQUE  IOHEXOL 350 MG/ML SOLN COMPARISON:  None. FINDINGS: CTA CHEST FINDINGS Cardiovascular: Filling defects are noted in peripheral branches of both pulmonary arteries consistent with acute pulmonary emboli. Atherosclerosis of thoracic aorta is noted without aneurysm or dissection. Mild cardiomegaly is noted. No pericardial effusion  is noted. Mediastinum/Nodes: Large sliding-type hiatal hernia is noted. Thyroid gland is unremarkable. No adenopathy is noted. Lungs/Pleura: No pneumothorax or pleural effusion is noted. Irregular density is noted peripherally in the left upper lobe with 16 x 11 mm irregular solid density; this may simply represent focal inflammation, but underlying neoplasm cannot be excluded. Follow-up CT scan in 3 weeks is recommended to ensure resolution or stability. Musculoskeletal: No chest wall abnormality. No acute or significant osseous findings. Review of the MIP images confirms the above findings. CT ABDOMEN and PELVIS FINDINGS Hepatobiliary: No focal liver abnormality is seen. No gallstones, gallbladder wall thickening, or biliary dilatation. Pancreas: Unremarkable. No pancreatic ductal dilatation or surrounding inflammatory changes. Spleen: Normal in size without focal abnormality. Adrenals/Urinary Tract: Adrenal glands are unremarkable. Bilateral renal cysts are noted. No hydronephrosis or renal obstruction is noted. No renal or ureteral calculi are noted. Urinary bladder is unremarkable. Stomach/Bowel: There is a large intussusception involving the terminal ileum, right and transverse colon. Possible 19 mm lipoma may be at the lead point. Sigmoid diverticulosis is noted without inflammation. Vascular/Lymphatic: Aortic atherosclerosis. No enlarged abdominal or pelvic lymph nodes. Reproductive: Uterus and bilateral adnexa are unremarkable. Other: No abdominal wall hernia or abnormality. No abdominopelvic ascites. Musculoskeletal: No acute or significant osseous findings. Review of the MIP images  confirms the above findings. IMPRESSION: 1. Acute bilateral pulmonary emboli are noted. 2. Irregular density is noted peripherally in the left upper lobe with 16 x 11 mm irregular solid density; this may simply represent focal inflammation, but underlying neoplasm cannot be excluded. Follow-up CT scan in 3 weeks is recommended to ensure resolution or stability. 3. Large intussusception is seen involving the terminal ileum, right and transverse colon. Possible 19 mm lipoma may be at the lead point. 4. Large sliding-type hiatal hernia is noted. 5. Sigmoid diverticulosis without inflammation. Critical Value/emergent results were called by telephone at the time of interpretation on 04/29/2020 at 3:17 pm to provider [facility] , who verbally acknowledged these results. Aortic Atherosclerosis (ICD10-I70.0). Electronically Signed   By: Marijo Conception M.D.   On: 04/29/2020 15:17   CT ABDOMEN PELVIS W CONTRAST  Result Date: 04/29/2020 CLINICAL DATA:  Lower abdominal pain, bloody stools. EXAM: CT ANGIOGRAPHY CHEST CT ABDOMEN AND PELVIS WITH CONTRAST TECHNIQUE: Multidetector CT imaging of the chest was performed using the standard protocol during bolus administration of intravenous contrast. Multiplanar CT image reconstructions and MIPs were obtained to evaluate the vascular anatomy. Multidetector CT imaging of the abdomen and pelvis was performed using the standard protocol during bolus administration of intravenous contrast. CONTRAST:  187mL OMNIPAQUE IOHEXOL 350 MG/ML SOLN COMPARISON:  None. FINDINGS: CTA CHEST FINDINGS Cardiovascular: Filling defects are noted in peripheral branches of both pulmonary arteries consistent with acute pulmonary emboli. Atherosclerosis of thoracic aorta is noted without aneurysm or dissection. Mild cardiomegaly is noted. No pericardial effusion is noted. Mediastinum/Nodes: Large sliding-type hiatal hernia is noted. Thyroid gland is unremarkable. No adenopathy is noted. Lungs/Pleura: No  pneumothorax or pleural effusion is noted. Irregular density is noted peripherally in the left upper lobe with 16 x 11 mm irregular solid density; this may simply represent focal inflammation, but underlying neoplasm cannot be excluded. Follow-up CT scan in 3 weeks is recommended to ensure resolution or stability. Musculoskeletal: No chest wall abnormality. No acute or significant osseous findings. Review of the MIP images confirms the above findings. CT ABDOMEN and PELVIS FINDINGS Hepatobiliary: No focal liver abnormality is seen. No gallstones, gallbladder wall thickening, or biliary dilatation. Pancreas:  Unremarkable. No pancreatic ductal dilatation or surrounding inflammatory changes. Spleen: Normal in size without focal abnormality. Adrenals/Urinary Tract: Adrenal glands are unremarkable. Bilateral renal cysts are noted. No hydronephrosis or renal obstruction is noted. No renal or ureteral calculi are noted. Urinary bladder is unremarkable. Stomach/Bowel: There is a large intussusception involving the terminal ileum, right and transverse colon. Possible 19 mm lipoma may be at the lead point. Sigmoid diverticulosis is noted without inflammation. Vascular/Lymphatic: Aortic atherosclerosis. No enlarged abdominal or pelvic lymph nodes. Reproductive: Uterus and bilateral adnexa are unremarkable. Other: No abdominal wall hernia or abnormality. No abdominopelvic ascites. Musculoskeletal: No acute or significant osseous findings. Review of the MIP images confirms the above findings. IMPRESSION: 1. Acute bilateral pulmonary emboli are noted. 2. Irregular density is noted peripherally in the left upper lobe with 16 x 11 mm irregular solid density; this may simply represent focal inflammation, but underlying neoplasm cannot be excluded. Follow-up CT scan in 3 weeks is recommended to ensure resolution or stability. 3. Large intussusception is seen involving the terminal ileum, right and transverse colon. Possible 19 mm  lipoma may be at the lead point. 4. Large sliding-type hiatal hernia is noted. 5. Sigmoid diverticulosis without inflammation. Critical Value/emergent results were called by telephone at the time of interpretation on 04/29/2020 at 3:17 pm to provider [facility] , who verbally acknowledged these results. Aortic Atherosclerosis (ICD10-I70.0). Electronically Signed   By: Marijo Conception M.D.   On: 04/29/2020 15:17   DG Chest Portable 1 View  Result Date: 04/29/2020 CLINICAL DATA:  Shortness of breath. EXAM: PORTABLE CHEST 1 VIEW COMPARISON:  05/24/2017 FINDINGS: Large hiatal hernia, better characterized on priors. Similar lung hyperinflation. Small left pleural effusion versus scar. Bibasilar (left greater than right) opacities. Mild enlargement of the cardiac silhouette, similar to prior. Aortic atherosclerosis. No acute bony abnormality. IMPRESSION: 1. Small left pleural effusion versus scar. Bibasilar opacities may represent atelectasis, although aspiration and/or pneumonia is not excluded. 2. Hyperinflation/COPD. 3. Mild cardiomegaly. Electronically Signed   By: Margaretha Sheffield MD   On: 04/29/2020 12:41      Flora Lipps, MD  [facility] 04/30/2020

## 2020-04-30 NOTE — Progress Notes (Signed)
[place] for Heparin  Indication: bilateral pulmonary embolus  Allergies  Allergen Reactions  . Ibuprofen     REACTION: bleed    Patient Measurements: Height: 5\' 2"  (157.5 cm) Weight: 54.7 kg (120 lb 9.5 oz) IBW/kg (Calculated) : 50.1 Heparin Dosing Weight: 54.7 kg  Vital Signs: Temp: 98.3 F (36.8 C) (09/22 2236) Temp Source: Oral (09/22 2236) BP: 139/84 (09/22 2236) Pulse Rate: 84 (09/22 2236)  Labs: Recent Labs    04/29/20 1204 04/29/20 1501 04/30/20 0046 04/30/20 2239  HGB 15.7*  --  12.7  --   HCT 47.2*  --  38.5  --   PLT 289  --  234  --   HEPARINUNFRC  --   --   --  0.11*  CREATININE 0.66  --  0.50  --   TROPONINIHS 9 9  --   --     Estimated Creatinine Clearance: 37 mL/min (by C-G formula based on SCr of 0.5 mg/dL).   Medical History: Past Medical History:  Diagnosis Date  . Breast CA ([place])    left breast-invasive ductal ca stage 1-stopped arimidex 2008  . Diverticulitis of colon   . Hiatal hernia   . Hyperlipidemia   . Hypertension   . Hypothyroidism   . Osteoarthritis   . Osteopenia    DEXA 7/02  . Syncope 2005   CT head (-), ECHO essent. neg, stress test (-), carotid u/s (-)    Assessment: 86 y.o female presented to the ED on 9/21 with complaints of worsening diarrhea for 2 weeks with some bright red stool Diagnosed with  acute bilateral PE per CT angio 9/21 in ED.  Had hematochezia  thus did not start antiocoagulation until 9/22.   Pharmacy consulted to start IV Heparin for VTE treatment.   Patient has Intusscuception with mild bleeding;  needs surgery for intussuception of terminal ileum and right -sided colon. Surgery is planned 9/23,  Heparin gtt to stop at 0400 9/23.   No active bleeding 9/22,  Hgb 15.7>12.7, pltc wnl stable.  9/22 PM update:  Heparin level below goal No issues per RN   Goal of Therapy:  Heparin level 0.3-0.7 units/ml Monitor platelets by anticoagulation protocol: Yes    Plan:  Heparin 2000 units [place] heparin to 950 units/hr -Surgery planned for 9/23,  heparin drip to stop at 0400 on 9/23.  Will need to f/u for restart of heparin drip vs.  DOAC vs Heparin bridge to warfarin for bilateral PE.  Narda Bonds, PharmD, BCPS Clinical Pharmacist Phone: [phone]

## 2020-04-30 NOTE — Progress Notes (Signed)
Patient gave permission to give update to niece, Ollen Barges.

## 2020-05-01 ENCOUNTER — Inpatient Hospital Stay (HOSPITAL_COMMUNITY): Payer: Medicare HMO | Admitting: Anesthesiology

## 2020-05-01 ENCOUNTER — Other Ambulatory Visit (HOSPITAL_COMMUNITY): Payer: Medicare HMO

## 2020-05-01 ENCOUNTER — Encounter (HOSPITAL_COMMUNITY): Payer: Self-pay | Admitting: Family Medicine

## 2020-05-01 ENCOUNTER — Encounter (HOSPITAL_COMMUNITY)
Admission: EM | Disposition: A | Discharge status: Skilled Nursing Facility | Payer: Self-pay | Source: Home / Self Care | Attending: Internal Medicine

## 2020-05-01 DIAGNOSIS — Z7189 Other specified counseling: Secondary | ICD-10-CM

## 2020-05-01 DIAGNOSIS — Z515 Encounter for palliative care: Secondary | ICD-10-CM

## 2020-05-01 HISTORY — PX: PARTIAL COLECTOMY: SHX5273

## 2020-05-01 LAB — CBC
HCT: 41.6 % (ref 36.0–46.0)
Hemoglobin: 13.7 g/dL (ref 12.0–15.0)
MCH: 30.9 pg (ref 26.0–34.0)
MCHC: 32.9 g/dL (ref 30.0–36.0)
MCV: 93.9 fL (ref 80.0–100.0)
Platelets: 275 10*3/uL (ref 150–400)
RBC: 4.43 MIL/uL (ref 3.87–5.11)
RDW: 12.9 % (ref 11.5–15.5)
WBC: 9.2 10*3/uL (ref 4.0–10.5)
nRBC: 0 % (ref 0.0–0.2)

## 2020-05-01 LAB — SURGICAL PCR SCREEN
MRSA, PCR: NEGATIVE
Staphylococcus aureus: NEGATIVE

## 2020-05-01 LAB — TSH: TSH: 4.604 u[IU]/mL — ABNORMAL HIGH (ref 0.350–4.500)

## 2020-05-01 LAB — MAGNESIUM: Magnesium: 2.2 mg/dL (ref 1.7–2.4)

## 2020-05-01 LAB — PROTIME-INR
INR: 1.3 — ABNORMAL HIGH (ref 0.8–1.2)
Prothrombin Time: 15.8 seconds — ABNORMAL HIGH (ref 11.4–15.2)

## 2020-05-01 LAB — BASIC METABOLIC PANEL
Anion gap: 9 (ref 5–15)
BUN: <5 mg/dL — ABNORMAL LOW (ref 8–23)
CO2: 27 mmol/L (ref 22–32)
Calcium: 8.3 mg/dL — ABNORMAL LOW (ref 8.9–10.3)
Chloride: 102 mmol/L (ref 98–111)
Creatinine, Ser: 0.49 mg/dL (ref 0.44–1.00)
GFR calc Af Amer: >60 mL/min (ref >60)
GFR calc non Af Amer: >60 mL/min (ref >60)
Glucose, Bld: 128 mg/dL — ABNORMAL HIGH (ref 70–99)
Potassium: 3.9 mmol/L (ref 3.5–5.1)
Sodium: 138 mmol/L (ref 135–145)

## 2020-05-01 LAB — TYPE AND SCREEN
ABO/RH(D): A POS
Antibody Screen: NEGATIVE

## 2020-05-01 LAB — ABO/RH: ABO/RH(D): A POS

## 2020-05-01 SURGERY — COLECTOMY, PARTIAL
Anesthesia: General | Site: Abdomen

## 2020-05-01 MED ORDER — LIDOCAINE 2% (20 MG/ML) 5 ML SYRINGE
INTRAMUSCULAR | Status: DC | PRN
  Administered 2020-05-01: 40 mg via INTRAVENOUS

## 2020-05-01 MED ORDER — HEPARIN (PORCINE) 25000 UT/250ML-% IV SOLN
1100.0000 [IU]/h | INTRAVENOUS | Status: DC
  Administered 2020-05-01 (×2): 950 [IU]/h via INTRAVENOUS
  Filled 2020-05-01 (×2): qty 250

## 2020-05-01 MED ORDER — PROPOFOL 500 MG/50ML IV EMUL
INTRAVENOUS | Status: DC | PRN
  Administered 2020-05-01: 20 ug/kg/min via INTRAVENOUS

## 2020-05-01 MED ORDER — LACTATED RINGERS IV SOLN: INTRAVENOUS | Status: DC

## 2020-05-01 MED ORDER — OXYCODONE HCL 5 MG PO TABS
5.0000 mg | ORAL_TABLET | ORAL | Status: DC | PRN
  Administered 2020-05-01: 5 mg via ORAL
  Filled 2020-05-01 (×2): qty 1

## 2020-05-01 MED ORDER — MORPHINE SULFATE (PF) 2 MG/ML IV SOLN: 1.0000 mg | INTRAVENOUS | Status: DC | PRN

## 2020-05-01 MED ORDER — CHLORHEXIDINE GLUCONATE 0.12 % MT SOLN: 15.0000 mL | Freq: Once | OROMUCOSAL | Status: DC

## 2020-05-01 MED ORDER — BUPIVACAINE HCL (PF) 0.25 % IJ SOLN
INTRAMUSCULAR | Status: AC
  Filled 2020-05-01: qty 30

## 2020-05-01 MED ORDER — 0.9 % SODIUM CHLORIDE (POUR BTL) OPTIME
TOPICAL | Status: DC | PRN
  Administered 2020-05-01: 1000 mL

## 2020-05-01 MED ORDER — ACETAMINOPHEN 325 MG PO TABS: 650.0000 mg | ORAL_TABLET | Freq: Four times a day (QID) | ORAL | Status: DC | PRN

## 2020-05-01 MED ORDER — STERILE WATER FOR IRRIGATION IR SOLN
Status: DC | PRN
  Administered 2020-05-01: 1000 mL

## 2020-05-01 MED ORDER — ONDANSETRON HCL 4 MG/2ML IJ SOLN: 4.0000 mg | Freq: Once | INTRAMUSCULAR | Status: DC | PRN

## 2020-05-01 MED ORDER — BUPIVACAINE LIPOSOME 1.3 % IJ SUSP
20.0000 mL | Freq: Once | INTRAMUSCULAR | Status: DC
  Filled 2020-05-01: qty 20

## 2020-05-01 MED ORDER — FENTANYL CITRATE (PF) 100 MCG/2ML IJ SOLN
INTRAMUSCULAR | Status: AC
  Filled 2020-05-01: qty 2

## 2020-05-01 MED ORDER — OXYCODONE HCL 5 MG PO TABS
ORAL_TABLET | ORAL | Status: AC
  Filled 2020-05-01: qty 1

## 2020-05-01 MED ORDER — OXYCODONE HCL 5 MG PO TABS
5.0000 mg | ORAL_TABLET | Freq: Once | ORAL | Status: AC | PRN
  Administered 2020-05-01: 5 mg via ORAL

## 2020-05-01 MED ORDER — SACCHAROMYCES BOULARDII 250 MG PO CAPS
250.0000 mg | ORAL_CAPSULE | Freq: Two times a day (BID) | ORAL | Status: DC
  Administered 2020-05-01 – 2020-05-05 (×8): 250 mg via ORAL
  Filled 2020-05-01 (×8): qty 1

## 2020-05-01 MED ORDER — PROPOFOL 10 MG/ML IV BOLUS
INTRAVENOUS | Status: DC | PRN
  Administered 2020-05-01: 70 mg via INTRAVENOUS

## 2020-05-01 MED ORDER — GABAPENTIN 300 MG PO CAPS
300.0000 mg | ORAL_CAPSULE | Freq: Two times a day (BID) | ORAL | Status: DC
  Administered 2020-05-01 – 2020-05-07 (×12): 300 mg via ORAL
  Filled 2020-05-01 (×12): qty 1

## 2020-05-01 MED ORDER — ONDANSETRON HCL 4 MG/2ML IJ SOLN
INTRAMUSCULAR | Status: DC | PRN
  Administered 2020-05-01: 4 mg via INTRAVENOUS

## 2020-05-01 MED ORDER — FENTANYL CITRATE (PF) 250 MCG/5ML IJ SOLN
INTRAMUSCULAR | Status: AC
  Filled 2020-05-01: qty 5

## 2020-05-01 MED ORDER — ROCURONIUM BROMIDE 10 MG/ML (PF) SYRINGE
PREFILLED_SYRINGE | INTRAVENOUS | Status: DC | PRN
  Administered 2020-05-01: 40 mg via INTRAVENOUS
  Administered 2020-05-01: 10 mg via INTRAVENOUS

## 2020-05-01 MED ORDER — OXYCODONE HCL 5 MG/5ML PO SOLN: 5.0000 mg | Freq: Once | ORAL | Status: AC | PRN

## 2020-05-01 MED ORDER — FENTANYL CITRATE (PF) 100 MCG/2ML IJ SOLN
25.0000 ug | INTRAMUSCULAR | Status: DC | PRN
  Administered 2020-05-01 (×2): 25 ug via INTRAVENOUS

## 2020-05-01 MED ORDER — BUPIVACAINE HCL (PF) 0.25 % IJ SOLN
INTRAMUSCULAR | Status: DC | PRN
  Administered 2020-05-01: 20 mL

## 2020-05-01 MED ORDER — ALVIMOPAN 12 MG PO CAPS: 12.0000 mg | ORAL_CAPSULE | ORAL | Status: DC

## 2020-05-01 MED ORDER — CHLORHEXIDINE GLUCONATE CLOTH 2 % EX PADS
6.0000 | MEDICATED_PAD | Freq: Every day | CUTANEOUS | Status: DC
  Administered 2020-05-01 – 2020-05-03 (×3): 6 via TOPICAL

## 2020-05-01 MED ORDER — PHENYLEPHRINE HCL-NACL 10-0.9 MG/250ML-% IV SOLN
INTRAVENOUS | Status: DC | PRN
  Administered 2020-05-01: 25 ug/min via INTRAVENOUS

## 2020-05-01 MED ORDER — FENTANYL CITRATE (PF) 250 MCG/5ML IJ SOLN
INTRAMUSCULAR | Status: DC | PRN
Start: 2020-05-01 — End: 2020-05-01
  Administered 2020-05-01 (×4): 50 ug via INTRAVENOUS

## 2020-05-01 MED ORDER — BUPIVACAINE LIPOSOME 1.3 % IJ SUSP
INTRAMUSCULAR | Status: DC | PRN
  Administered 2020-05-01: 20 mL

## 2020-05-01 MED ORDER — ALVIMOPAN 12 MG PO CAPS: 12.0000 mg | ORAL_CAPSULE | Freq: Two times a day (BID) | ORAL | Status: DC

## 2020-05-01 SURGICAL SUPPLY — 50 items
BLADE CLIPPER SURG (BLADE) IMPLANT
CANISTER SUCT 3000ML PPV (MISCELLANEOUS) ×3 IMPLANT
CATH FOLEY 2WAY SLVR  5CC 14FR (CATHETERS) ×2
CATH FOLEY 2WAY SLVR 5CC 14FR (CATHETERS) IMPLANT
CELLS DAT CNTRL 66122 CELL SVR (MISCELLANEOUS) ×1 IMPLANT
COVER SURGICAL LIGHT HANDLE (MISCELLANEOUS) ×2 IMPLANT
COVER WAND RF STERILE (DRAPES) ×1 IMPLANT
DRAPE INCISE IOBAN 66X45 STRL (DRAPES) IMPLANT
DRSG OPSITE POSTOP 4X10 (GAUZE/BANDAGES/DRESSINGS) IMPLANT
DRSG OPSITE POSTOP 4X8 (GAUZE/BANDAGES/DRESSINGS) ×1 IMPLANT
ELECT CAUTERY BLADE 6.4 (BLADE) ×4 IMPLANT
ELECT REM PT RETURN 9FT ADLT (ELECTROSURGICAL) ×2
ELECTRODE REM PT RTRN 9FT ADLT (ELECTROSURGICAL) ×1 IMPLANT
GLOVE BIO SURGEON STRL SZ 6.5 (GLOVE) ×1 IMPLANT
GLOVE SURG SIGNA 7.5 PF LTX (GLOVE) ×4 IMPLANT
GOWN STRL REUS W/ TWL LRG LVL3 (GOWN DISPOSABLE) ×4 IMPLANT
GOWN STRL REUS W/ TWL XL LVL3 (GOWN DISPOSABLE) ×2 IMPLANT
GOWN STRL REUS W/TWL LRG LVL3 (GOWN DISPOSABLE) ×8
GOWN STRL REUS W/TWL XL LVL3 (GOWN DISPOSABLE) ×4
KIT SIGMOIDOSCOPE (SET/KITS/TRAYS/PACK) IMPLANT
KIT TURNOVER KIT B (KITS) ×2 IMPLANT
LIGASURE IMPACT 36 18CM CVD LR (INSTRUMENTS) ×1 IMPLANT
NDL HYPO 25GX1X1/2 BEV (NEEDLE) IMPLANT
NEEDLE HYPO 25GX1X1/2 BEV (NEEDLE) ×2 IMPLANT
NS IRRIG 1000ML POUR BTL (IV SOLUTION) ×4 IMPLANT
PACK COLON (CUSTOM PROCEDURE TRAY) ×2 IMPLANT
PAD ARMBOARD 7.5X6 YLW CONV (MISCELLANEOUS) ×2 IMPLANT
PENCIL BUTTON HOLSTER BLD 10FT (ELECTRODE) ×2 IMPLANT
RELOAD PROXIMATE 75MM BLUE (ENDOMECHANICALS) ×4 IMPLANT
RELOAD STAPLE 75 3.8 BLU REG (ENDOMECHANICALS) IMPLANT
RETRACTOR WND ALEXIS 18 MED (MISCELLANEOUS) IMPLANT
RTRCTR WOUND ALEXIS 18CM MED (MISCELLANEOUS) ×2
SPECIMEN JAR LARGE (MISCELLANEOUS) ×2 IMPLANT
SPONGE LAP 18X18 RF (DISPOSABLE) IMPLANT
STAPLER GUN LINEAR PROX 60 (STAPLE) ×1 IMPLANT
STAPLER PROXIMATE 75MM BLUE (STAPLE) ×1 IMPLANT
STAPLER VISISTAT 35W (STAPLE) ×2 IMPLANT
SURGILUBE 2OZ TUBE FLIPTOP (MISCELLANEOUS) IMPLANT
SUT PDS AB 1 TP1 96 (SUTURE) ×2 IMPLANT
SUT PROLENE 2 0 CT2 30 (SUTURE) IMPLANT
SUT PROLENE 2 0 KS (SUTURE) IMPLANT
SUT SILK 2 0 SH CR/8 (SUTURE) ×2 IMPLANT
SUT SILK 2 0 TIES 10X30 (SUTURE) ×2 IMPLANT
SUT SILK 3 0 SH CR/8 (SUTURE) ×3 IMPLANT
SUT SILK 3 0 TIES 10X30 (SUTURE) ×2 IMPLANT
SUT VIC AB 3-0 SH 18 (SUTURE) IMPLANT
SYR CONTROL 10ML LL (SYRINGE) ×1 IMPLANT
TRAY FOLEY MTR SLVR 14FR STAT (SET/KITS/TRAYS/PACK) ×2 IMPLANT
TUBE CONNECTING 12X1/4 (SUCTIONS) ×5 IMPLANT
UNDERPAD 30X36 HEAVY ABSORB (UNDERPADS AND DIAPERS) IMPLANT

## 2020-05-01 NOTE — Transfer of Care (Signed)
Immediate Anesthesia Transfer of Care Note  Patient: Ariel Medina  Procedure(s) Performed: PARTIAL COLECTOMY (N/A Abdomen)  Patient Location: PACU  Anesthesia Type:General  Level of Consciousness: awake, alert  and oriented  Airway & Oxygen Therapy: Patient Spontanous Breathing and Patient connected to nasal cannula oxygen  Post-op Assessment: Report given to RN and Post -op Vital signs reviewed and stable  Post vital signs: Reviewed and stable  Last Vitals:  Vitals Value Taken Time  BP 166/92 05/01/20 1041  Temp    Pulse 71 05/01/20 1043  Resp 19 05/01/20 1043  SpO2 100 % 05/01/20 1043  Vitals shown include unvalidated device data.  Last Pain:  Vitals:   05/01/20 0814  TempSrc: Oral  PainSc:          Complications: No complications documented.

## 2020-05-01 NOTE — Progress Notes (Signed)
Patient ID: Ariel Medina, female   DOB: 08-18-1927, 86 y.o.   MRN: [id number]   Pre Procedure note for inpatients:   Ariel Medina has been scheduled for Procedure(s): PARTIAL COLECTOMY (N/A) today. The various methods of treatment have been discussed with the patient. After consideration of the risks, benefits and treatment options the patient has consented to the planned procedure.   The patient has been seen and labs reviewed. There are no changes in the patients condition to prevent proceeding with the planned procedure today.  Recent labs:  Lab Results  Component Value Date   WBC 9.2 05/01/2020   HGB 13.7 05/01/2020   HCT 41.6 05/01/2020   PLT 275 05/01/2020   GLUCOSE 128 (H) 05/01/2020   CHOL 248 (H) 06/26/2019   TRIG 96.0 06/26/2019   HDL 60.60 06/26/2019   LDLDIRECT 150.2 06/07/2013   LDLCALC 169 (H) 06/26/2019   ALT 8 04/29/2020   AST 19 04/29/2020   NA 138 05/01/2020   K 3.9 05/01/2020   CL 102 05/01/2020   CREATININE 0.49 05/01/2020   BUN <5 (L) 05/01/2020   CO2 27 05/01/2020   TSH 4.604 (H) 05/01/2020   INR 1.3 (H) 05/01/2020    Coralie Keens, MD 05/01/2020 8:31 AM

## 2020-05-01 NOTE — Progress Notes (Signed)
PROGRESS NOTE  Ariel Medina WVP:[id number] DOB: 05/30/1928 DOA: 04/29/2020 PCP: Colon Branch, MD   LOS: 2 days   Brief narrative: As per HPI,  Ariel Medina is a 86 y.o. female with medical history significant for history of breast cancer, diverticulitis, hyperlipidemia, hypertension, hypothyroidism, osteoarthritis, osteopenia, CKD, who presented to the emergency department with complaints of worsening diarrhea for 2 weeks with some bright red stool.   Patient also had decreased appetite .  She was seen a few days ago in her PCP office for evaluation and had negative stool cultures and labs at that time.  Patient was revisited by PCP again for heme positive stools and was also noted to be hypoxic. In the emergency room, patient was noted to have bilateral PEs on CT angiography of her chest.  She also had a left upper lobe opacity which could represent a focal inflammation versus a underlying neoplasm.  She was started on empiric antibiotics for possible pneumonia.  She was also noted to have hematochezia and therefore was not placed on anticoagulation.  CT of her abdomen revealed intussusception of her terminal ileum and right-sided colon.  General surgery and GI were consulted.  Assessment/Plan:  Principal Problem:   Pulmonary embolism, bilateral (HCC) Active Problems:   Hypokalemia   Essential hypertension   Intussusception (HCC)   GI bleed   Abnormal CT scan of lung   Hypoxia   Acute pulmonary embolism (HCC)   Goals of care, counseling/discussion   Palliative care by specialist   DNR (do not resuscitate) discussion  Intussusception with mild rectal bleeding. CT scan of the abdomen with likely mass leading to intusseption.  For surgical intervention today.  Heparin drip on hold this morning.  Seen by cardiology with low risk for intervention.     Hypokalemia Improved with replacement.  Potassium 3.9 today.  Mild hypomagnesemia.  Improved with replacement.  Magnesium of 2.2  today.    Essential hypertension On IV antihypertensives as needed for blood pressure greater than 160/90, n.p.o. at this time.    Abnormal CT scan of lung Patient has a left upper lobe density which could be a focal inflammation versus neoplasm.  On empiric antibiotic for pneumonia,  Patient will need repeat CT scan in 3 to 4 weeks to document improvement of opacity.  Patient will likely need to be followed up by pulmonary for evaluation of this.  Acute hypoxic respiratory failure secondary to bilateral pulmonary embolism, bilateral lower extremity DVT, no right heart strain.  Possible mass versus pneumonia of the left upper lobe..  CT scan of the chest showed 16 x 11 mm irregular solid density in the left upper lobe.  Continue antibiotics.  Continue oxygen supplementation.  Has been started on heparin drip yesterday on hold for surgical intervention today.  Ultrasound of the lower extremity shows acute DVT involving the right popliteal vein right posterior tibial vein and right peroneal vein and left peroneal veins.  Pulmonary has seen the patient and recommend placing IVC filter if DVT is positive after surgery.  Ethics.  Palliative care has been consulted for goals of care.  Patient is a full code with full scope of care.  DVT prophylaxis: SCD's Start: 05/01/20 1142 Place TED hose Start: 05/01/20 1142   Code Status: Full code.    Family Communication:   I again spoke with the patient's daughter at bedside today  Status is: Inpatient  Remains inpatient appropriate because:IV treatments appropriate due to intensity of illness or inability to  take PO, Inpatient level of care appropriate due to severity of illness , intussusception needing surgical intervention, pulmonary embolism  Dispo: The patient is from: Home              Anticipated d/c is to: Home with home health              Anticipated d/c date is: 3 days              Patient currently is not medically stable to  d/c.  Consultants:  General surgery  Cardiology  Palliative care  PCCM   Procedures:  None  Antibiotics:  . Rocephin, IV, azithromycin, metronidazole  Anti-infectives (From admission, onward)   Start     Dose/Rate Route Frequency Ordered Stop   04/29/20 2030  cefTRIAXone (ROCEPHIN) 1 g in sodium chloride 0.9 % 100 mL IVPB        1 g 200 mL/hr over 30 Minutes Intravenous Every 24 hours 04/29/20 2019     04/29/20 2030  azithromycin (ZITHROMAX) 500 mg in sodium chloride 0.9 % 250 mL IVPB        500 mg 250 mL/hr over 60 Minutes Intravenous Every 24 hours 04/29/20 2019 05/04/20 2029   04/29/20 2030  metroNIDAZOLE (FLAGYL) IVPB 500 mg  Status:  Discontinued        500 mg 100 mL/hr over 60 Minutes Intravenous Every 8 hours 04/29/20 2019 05/01/20 1141   04/29/20 1715  metroNIDAZOLE (FLAGYL) IVPB 500 mg        500 mg 100 mL/hr over 60 Minutes Intravenous  Once 04/29/20 1701 04/29/20 1904   04/29/20 1545  cefTRIAXone (ROCEPHIN) 1 g in sodium chloride 0.9 % 100 mL IVPB        1 g 200 mL/hr over 30 Minutes Intravenous  Once 04/29/20 1541 04/29/20 1626   04/29/20 1545  azithromycin (ZITHROMAX) 500 mg in sodium chloride 0.9 % 250 mL IVPB        500 mg 250 mL/hr over 60 Minutes Intravenous  Once 04/29/20 1541 04/29/20 1757     Subjective: Today, patient was seen and examined at bedside.  Awaiting for surgical intervention today.  Denies any abdominal pain.  Denies nausea vomiting fever.  Has had bowel movements.  Complains of mild leg pain.   Objective: Vitals:   05/01/20 1148 05/01/20 1150  BP: (!) 158/84   Pulse:    Resp: 16 18  Temp: 98.5 F (36.9 C)   SpO2:  (!) 89%    Intake/Output Summary (Last 24 hours) at 05/01/2020 1315 Last data filed at 05/01/2020 1138 Gross per 24 hour  Intake 3250.48 ml  Output 725 ml  Net 2525.48 ml   Filed Weights   04/29/20 1145 04/29/20 2240  Weight: 55.1 kg 54.7 kg   Body mass index is 22.06 kg/m.   Physical Exam:  GENERAL:  Patient is alert awake and oriented. Not in obvious distress.on nasal canula oxygen.  Communicative. HENT: No scleral pallor or icterus. Pupils equally reactive to light. Oral mucosa is moist NECK: is supple, no gross swelling noted. CHEST:   Diminished breath sounds bilaterally.  No obvious crackles or wheezes noted. CVS: S1 and S2 heard, no murmur. Regular rate and rhythm.  ABDOMEN: Soft, mildly distended, vague mass in the right lower quadrant.  Nontender on palpation. EXTREMITIES: No edema.  Non specific tenderness on palpation of the bilateral lower extremities. CNS: Cranial nerves are intact. No focal motor deficits. SKIN: warm and dry without rashes.   Data  Review: I have personally reviewed the following laboratory data and studies,  CBC: Recent Labs  Lab 04/25/20 1620 04/29/20 1204 04/30/20 0046 05/01/20 0315  WBC 7.5 9.4 7.4 9.2  NEUTROABS 5,190 7.3  --   --   HGB 15.6* 15.7* 12.7 13.7  HCT 45.9* 47.2* 38.5 41.6  MCV 91.6 93.1 93.0 93.9  PLT 257 289 234 706   Basic Metabolic Panel: Recent Labs  Lab 04/25/20 1620 04/29/20 1204 04/30/20 0046 05/01/20 0315  NA 135 133* 137 138  K 4.0 3.3* 3.4* 3.9  CL 92* 91* 100 102  CO2 32 30 27 27   GLUCOSE 95 101* 93 128*  BUN 9 8 <5* <5*  CREATININE 0.64 0.66 0.50 0.49  CALCIUM 9.6 9.0 8.0* 8.3*  MG  --   --  1.6* 2.2   Liver Function Tests: Recent Labs  Lab 04/25/20 1620 04/29/20 1204  AST 13 19  ALT 4* 8  ALKPHOS  --  46  BILITOT 0.7 0.9  PROT 6.0* 6.0*  ALBUMIN  --  3.3*   Recent Labs  Lab 04/25/20 1620 04/29/20 1204  LIPASE 8 24   No results for input(s): AMMONIA in the last 168 hours. Cardiac Enzymes: No results for input(s): CKTOTAL, CKMB, CKMBINDEX, TROPONINI in the last 168 hours. BNP (last 3 results) No results for input(s): BNP in the last 8760 hours.  ProBNP (last 3 results) No results for input(s): PROBNP in the last 8760 hours.  CBG: No results for input(s): GLUCAP in the last 168  hours. Recent Results (from the past 240 hour(s))  SARS Coronavirus 2 by RT PCR (hospital order, performed in [facility] hospital lab) Nasopharyngeal Nasopharyngeal Swab     Status: None   Collection Time: 04/29/20  3:01 PM   Specimen: Nasopharyngeal Swab  Result Value Ref Range Status   SARS Coronavirus 2 NEGATIVE NEGATIVE Final    Comment: (NOTE) SARS-CoV-2 target nucleic acids are NOT DETECTED.  The SARS-CoV-2 RNA is generally detectable in upper and lower respiratory specimens during the acute phase of infection. The lowest concentration of SARS-CoV-2 viral copies this assay can detect is 250 copies / mL. A negative result does not preclude SARS-CoV-2 infection and should not be used as the sole basis for treatment or other patient management decisions.  A negative result may occur with improper specimen collection / handling, submission of specimen other than nasopharyngeal swab, presence of viral mutation(s) within the areas targeted by this assay, and inadequate number of viral copies (<250 copies / mL). A negative result must be combined with clinical observations, patient history, and epidemiological information.  Fact Sheet for Patients:   StrictlyIdeas.no  Fact Sheet for Healthcare Providers: BankingDealers.co.za  This test is not yet approved or  cleared by the [place] FDA and has been authorized for detection and/or diagnosis of SARS-CoV-2 by FDA under an Emergency Use Authorization (EUA).  This EUA will remain in effect (meaning this test can be used) for the duration of the COVID-19 declaration under Section 564(b)(1) of the Act, 21 U.S.C. section 360bbb-3(b)(1), unless the authorization is terminated or revoked sooner.  Performed at [facility], [address], [place], [place] [address]   Surgical pcr screen     Status: None   Collection Time: 05/01/20  3:22 AM   Specimen: Nasal Mucosa; Nasal  Swab  Result Value Ref Range Status   MRSA, PCR NEGATIVE NEGATIVE Final   Staphylococcus aureus NEGATIVE NEGATIVE Final    Comment: (NOTE)  The Xpert SA Assay (FDA approved for NASAL specimens in patients 61 years of age and older), is one component of a comprehensive surveillance program. It is not intended to diagnose infection nor to guide or monitor treatment. Performed at [facility], [place] [address] [place], [place] [address]      Studies: CT Angio Chest PE W and/or Wo Contrast  Result Date: 04/29/2020 CLINICAL DATA:  Lower abdominal pain, bloody stools. EXAM: CT ANGIOGRAPHY CHEST CT ABDOMEN AND PELVIS WITH CONTRAST TECHNIQUE: Multidetector CT imaging of the chest was performed using the standard protocol during bolus administration of intravenous contrast. Multiplanar CT image reconstructions and MIPs were obtained to evaluate the vascular anatomy. Multidetector CT imaging of the abdomen and pelvis was performed using the standard protocol during bolus administration of intravenous contrast. CONTRAST:  174mL OMNIPAQUE IOHEXOL 350 MG/ML SOLN COMPARISON:  None. FINDINGS: CTA CHEST FINDINGS Cardiovascular: Filling defects are noted in peripheral branches of both pulmonary arteries consistent with acute pulmonary emboli. Atherosclerosis of thoracic aorta is noted without aneurysm or dissection. Mild cardiomegaly is noted. No pericardial effusion is noted. Mediastinum/Nodes: Large sliding-type hiatal hernia is noted. Thyroid gland is unremarkable. No adenopathy is noted. Lungs/Pleura: No pneumothorax or pleural effusion is noted. Irregular density is noted peripherally in the left upper lobe with 16 x 11 mm irregular solid density; this may simply represent focal inflammation, but underlying neoplasm cannot be excluded. Follow-up CT scan in 3 weeks is recommended to ensure resolution or stability. Musculoskeletal: No chest wall abnormality. No acute or significant osseous findings. Review of  the MIP images confirms the above findings. CT ABDOMEN and PELVIS FINDINGS Hepatobiliary: No focal liver abnormality is seen. No gallstones, gallbladder wall thickening, or biliary dilatation. Pancreas: Unremarkable. No pancreatic ductal dilatation or surrounding inflammatory changes. Spleen: Normal in size without focal abnormality. Adrenals/Urinary Tract: Adrenal glands are unremarkable. Bilateral renal cysts are noted. No hydronephrosis or renal obstruction is noted. No renal or ureteral calculi are noted. Urinary bladder is unremarkable. Stomach/Bowel: There is a large intussusception involving the terminal ileum, right and transverse colon. Possible 19 mm lipoma may be at the lead point. Sigmoid diverticulosis is noted without inflammation. Vascular/Lymphatic: Aortic atherosclerosis. No enlarged abdominal or pelvic lymph nodes. Reproductive: Uterus and bilateral adnexa are unremarkable. Other: No abdominal wall hernia or abnormality. No abdominopelvic ascites. Musculoskeletal: No acute or significant osseous findings. Review of the MIP images confirms the above findings. IMPRESSION: 1. Acute bilateral pulmonary emboli are noted. 2. Irregular density is noted peripherally in the left upper lobe with 16 x 11 mm irregular solid density; this may simply represent focal inflammation, but underlying neoplasm cannot be excluded. Follow-up CT scan in 3 weeks is recommended to ensure resolution or stability. 3. Large intussusception is seen involving the terminal ileum, right and transverse colon. Possible 19 mm lipoma may be at the lead point. 4. Large sliding-type hiatal hernia is noted. 5. Sigmoid diverticulosis without inflammation. Critical Value/emergent results were called by telephone at the time of interpretation on 04/29/2020 at 3:17 pm to provider [facility] , who verbally acknowledged these results. Aortic Atherosclerosis (ICD10-I70.0). Electronically Signed   By: Marijo Conception M.D.   On: 04/29/2020  15:17   CT ABDOMEN PELVIS W CONTRAST  Result Date: 04/29/2020 CLINICAL DATA:  Lower abdominal pain, bloody stools. EXAM: CT ANGIOGRAPHY CHEST CT ABDOMEN AND PELVIS WITH CONTRAST TECHNIQUE: Multidetector CT imaging of the chest was performed using the standard protocol during bolus administration of intravenous contrast. Multiplanar CT image  reconstructions and MIPs were obtained to evaluate the vascular anatomy. Multidetector CT imaging of the abdomen and pelvis was performed using the standard protocol during bolus administration of intravenous contrast. CONTRAST:  163mL OMNIPAQUE IOHEXOL 350 MG/ML SOLN COMPARISON:  None. FINDINGS: CTA CHEST FINDINGS Cardiovascular: Filling defects are noted in peripheral branches of both pulmonary arteries consistent with acute pulmonary emboli. Atherosclerosis of thoracic aorta is noted without aneurysm or dissection. Mild cardiomegaly is noted. No pericardial effusion is noted. Mediastinum/Nodes: Large sliding-type hiatal hernia is noted. Thyroid gland is unremarkable. No adenopathy is noted. Lungs/Pleura: No pneumothorax or pleural effusion is noted. Irregular density is noted peripherally in the left upper lobe with 16 x 11 mm irregular solid density; this may simply represent focal inflammation, but underlying neoplasm cannot be excluded. Follow-up CT scan in 3 weeks is recommended to ensure resolution or stability. Musculoskeletal: No chest wall abnormality. No acute or significant osseous findings. Review of the MIP images confirms the above findings. CT ABDOMEN and PELVIS FINDINGS Hepatobiliary: No focal liver abnormality is seen. No gallstones, gallbladder wall thickening, or biliary dilatation. Pancreas: Unremarkable. No pancreatic ductal dilatation or surrounding inflammatory changes. Spleen: Normal in size without focal abnormality. Adrenals/Urinary Tract: Adrenal glands are unremarkable. Bilateral renal cysts are noted. No hydronephrosis or renal obstruction is  noted. No renal or ureteral calculi are noted. Urinary bladder is unremarkable. Stomach/Bowel: There is a large intussusception involving the terminal ileum, right and transverse colon. Possible 19 mm lipoma may be at the lead point. Sigmoid diverticulosis is noted without inflammation. Vascular/Lymphatic: Aortic atherosclerosis. No enlarged abdominal or pelvic lymph nodes. Reproductive: Uterus and bilateral adnexa are unremarkable. Other: No abdominal wall hernia or abnormality. No abdominopelvic ascites. Musculoskeletal: No acute or significant osseous findings. Review of the MIP images confirms the above findings. IMPRESSION: 1. Acute bilateral pulmonary emboli are noted. 2. Irregular density is noted peripherally in the left upper lobe with 16 x 11 mm irregular solid density; this may simply represent focal inflammation, but underlying neoplasm cannot be excluded. Follow-up CT scan in 3 weeks is recommended to ensure resolution or stability. 3. Large intussusception is seen involving the terminal ileum, right and transverse colon. Possible 19 mm lipoma may be at the lead point. 4. Large sliding-type hiatal hernia is noted. 5. Sigmoid diverticulosis without inflammation. Critical Value/emergent results were called by telephone at the time of interpretation on 04/29/2020 at 3:17 pm to provider [facility] , who verbally acknowledged these results. Aortic Atherosclerosis (ICD10-I70.0). Electronically Signed   By: Marijo Conception M.D.   On: 04/29/2020 15:17   VAS [place] LOWER EXTREMITY VENOUS (DVT)  Result Date: 04/30/2020  Lower Venous DVTStudy Indications: Edema.  Risk Factors: Confirmed PE. Comparison Study: No prior studies. Performing Technologist: Darlin Coco  Examination Guidelines: A complete evaluation includes B-mode imaging, spectral Doppler, color Doppler, and power Doppler as needed of all accessible portions of each vessel. Bilateral testing is considered an integral part of a complete examination.  Limited examinations for reoccurring indications may be performed as noted. The reflux portion of the exam is performed with the patient in reverse Trendelenburg.  +---------+---------------+---------+-----------+----------+--------------+ RIGHT    CompressibilityPhasicitySpontaneityPropertiesThrombus Aging +---------+---------------+---------+-----------+----------+--------------+ CFV      Full           Yes      Yes                                 +---------+---------------+---------+-----------+----------+--------------+ SFJ  Full                                                        +---------+---------------+---------+-----------+----------+--------------+ FV Prox  Full                                                        +---------+---------------+---------+-----------+----------+--------------+ FV Mid   Full                                                        +---------+---------------+---------+-----------+----------+--------------+ FV DistalFull                                                        +---------+---------------+---------+-----------+----------+--------------+ PFV      Full                                                        +---------+---------------+---------+-----------+----------+--------------+ POP      None           No       No         Mobile    Acute          +---------+---------------+---------+-----------+----------+--------------+ PTV      Partial        No       No                   Acute          +---------+---------------+---------+-----------+----------+--------------+ PERO     None           No       No                   Acute          +---------+---------------+---------+-----------+----------+--------------+   +---------+---------------+---------+-----------+----------+--------------+ LEFT     CompressibilityPhasicitySpontaneityPropertiesThrombus Aging  +---------+---------------+---------+-----------+----------+--------------+ CFV      Full           Yes      Yes                                 +---------+---------------+---------+-----------+----------+--------------+ SFJ      Full                                                        +---------+---------------+---------+-----------+----------+--------------+ FV Prox  Full                                                        +---------+---------------+---------+-----------+----------+--------------+  FV Mid   Full                                                        +---------+---------------+---------+-----------+----------+--------------+ FV DistalFull                                                        +---------+---------------+---------+-----------+----------+--------------+ PFV      Full                                                        +---------+---------------+---------+-----------+----------+--------------+ POP      Full           Yes      Yes                                 +---------+---------------+---------+-----------+----------+--------------+ PTV      Full                                                        +---------+---------------+---------+-----------+----------+--------------+ PERO     Partial        No       No                   Acute          +---------+---------------+---------+-----------+----------+--------------+     Summary: RIGHT: - Findings consistent with acute deep vein thrombosis involving the right popliteal vein, right posterior tibial veins, and right peroneal veins. - A cystic structure is found in the popliteal fossa.  LEFT: - Findings consistent with acute deep vein thrombosis involving the left peroneal veins. - No cystic structure found in the popliteal fossa.  *See table(s) above for measurements and observations. Electronically signed by Harold Barban MD on 04/30/2020 at 9:32:39 PM.    Final        Flora Lipps, MD  [facility] 05/01/2020

## 2020-05-01 NOTE — Anesthesia Postprocedure Evaluation (Signed)
Anesthesia Post Note  Patient: Ariel Medina  Procedure(s) Performed: PARTIAL COLECTOMY (N/A Abdomen)     Patient location during evaluation: PACU Anesthesia Type: General Level of consciousness: awake and alert Pain management: pain level controlled Vital Signs Assessment: post-procedure vital signs reviewed and stable Respiratory status: spontaneous breathing, nonlabored ventilation, respiratory function stable and patient connected to nasal cannula oxygen Cardiovascular status: blood pressure returned to baseline and stable Postop Assessment: no apparent nausea or vomiting Anesthetic complications: no   No complications documented.  Last Vitals:  Vitals:   05/01/20 1148 05/01/20 1150  BP: (!) 158/84   Pulse:    Resp: 16 18  Temp: 36.9 C   SpO2:  (!) 89%    Last Pain:  Vitals:   05/01/20 1218  TempSrc:   PainSc: [name]

## 2020-05-01 NOTE — Op Note (Signed)
DATE OF PROCEDURE: 05/01/2020  PREOPERATIVE DIAGNOSIS: Right colon intussusception    POSTOPERATIVE DIAGNOSIS: Right colon intussusception    PROCEDURE PERFORMED: Open extended right colectomy with stapled anastomosis   SURGEONS: Coralie Keens, MD Daiva Huge, MD (resident)   ANESTHESIA: General   INDICATIONS: The patient is a [name]  presented with right colon intessusception   PROCEDURE IN DETAIL:   Following informed consent, the patient was taken to the operating room and was placed in the supine position. Anesthesia was induced and the abdomen was prepped and draped in the usual standard fashion. An operative time out including all members of the operating room staff was conducted.   A midline incision was made and carried down to the fascia with electrocautery.  The fascia was entered sharply. Upon entering the abdomen, the right colon was examined. There was evidence of intussusception of terminal ileum into the right colon. Involved small bowel and colon were congested, but viable. No evidence of perforation. The small bowel was ran with no evidence of other masses. Liver was normal. The right colon, up to past the hepatic flexure, the colon was thickened and congested.  We then proceeded with the dissection. The white line of [place] was incised in the right paracolic gutters, proceeding from cephalad down and taking care not to injure the duodenum or underlying ureter. The right colon was fully mobilized from lateral to medial. A small opening was created in the colonic mesentery and the colon was divided at the level of the transverse colon, where it appeared normal and not thickened, using the GIA 75 stapler. The proximal transection site was identified at the level of the distal terminal ileum, distal to point of intussusception. The small bowel was transected at this level using the GIA 75 stapler. The right colon mesentery was then divided with the Ligasure. An  ileo-colonic anastomosis was constructed using the GIA 75 stable, and the enterotomy was closed with TA 60 stapler. A layer of interrupted Silk Lembert sutures was done over staple line. The mesenteric defect was then closed with interrupted 2-0 Vicryl. Abdomen was irrigated with warm normal saline.  At this point, we re-scrubbed and abdomen was prepped. A closing tray was used to close fascia. Fascia was close with a running #1 PDS. Skin was closed with staples. Opsite dressing was applied to skin. All counts were correct at the end of the procedure. Patient tolerated it procedure well and was transferred to PACU in stable condition.     ESTIMATED BLOOD LOSS: 20cc   SPECIMENS: Right colon and terminal ileum   DRAINS: None   COMPLICATIONS: None

## 2020-05-01 NOTE — Progress Notes (Signed)
Progress Note  Patient Name: Ariel Medina Date of Encounter: 05/01/2020  Primary Cardiologist:   Minus Breeding, MD   Subjective   She is post op and just wants to rest.  No cardiac complaints.  No chest pain or SOB.   Inpatient Medications    Scheduled Meds: . alvimopan  12 mg Oral To SS-Surg   Continuous Infusions: . azithromycin 500 mg (04/30/20 2246)  . cefTRIAXone (ROCEPHIN)  IV 1 g (04/30/20 2257)  . lactated ringers 75 mL/hr at 05/01/20 0623  . metronidazole 500 mg (05/01/20 0623)   PRN Meds: hydrALAZINE, HYDROmorphone (DILAUDID) injection, influenza vaccine adjuvanted, ondansetron **OR** ondansetron (ZOFRAN) IV   Vital Signs    Vitals:   04/30/20 1703 04/30/20 2236 04/30/20 2357 05/01/20 0445  BP: (!) 144/87 139/84 134/83 (!) 156/92  Pulse: 83 84 96 81  Resp: (!) 23 20 (!) 22 20  Temp: 98.3 F (36.8 C) 98.3 F (36.8 C) 97.7 F (36.5 C) 98.4 F (36.9 C)  TempSrc: Oral Oral Oral Oral  SpO2: 98% 97% 97% 98%  Weight:      Height:        Intake/Output Summary (Last 24 hours) at 05/01/2020 0754 Last data filed at 05/01/2020 0300 Gross per 24 hour  Intake 2450.48 ml  Output 400 ml  Net 2050.48 ml   Filed Weights   04/29/20 1145 04/29/20 2240  Weight: 55.1 kg 54.7 kg    Telemetry    NSR, blocked PACs, brief sinus pauses.  No sustained arrhythmias - Personally Reviewed  ECG    NA - Personally Reviewed  Physical Exam   GEN: No acute distress.   Neck: No  JVD Cardiac: RRR, no murmurs, rubs, or gallops.  Respiratory: Clear  to auscultation bilaterally. GI: Non-distended.  Absent bowel sounds MS: No  edema; No deformity. Neuro:  Nonfocal  Psych: Normal affect   Labs    Chemistry Recent Labs  Lab 04/25/20 1620 04/25/20 1620 04/29/20 1204 04/30/20 0046 05/01/20 0315  NA 135   < > 133* 137 138  K 4.0   < > 3.3* 3.4* 3.9  CL 92*   < > 91* 100 102  CO2 32   < > 30 27 27   GLUCOSE 95   < > 101* 93 128*  BUN 9   < > 8 <5* <5*   CREATININE 0.64  --  0.66 0.50 0.49  CALCIUM 9.6   < > 9.0 8.0* 8.3*  PROT 6.0*  --  6.0*  --   --   ALBUMIN  --   --  3.3*  --   --   AST 13  --  19  --   --   ALT 4*  --  8  --   --   ALKPHOS  --   --  46  --   --   BILITOT 0.7  --  0.9  --   --   GFRNONAA  --   --  >60 >60 >60  GFRAA  --   --  >60 >60 >60  ANIONGAP  --   --  12 10 9    < > = values in this interval not displayed.     Hematology Recent Labs  Lab 04/29/20 1204 04/30/20 0046 05/01/20 0315  WBC 9.4 7.4 9.2  RBC 5.07 4.14 4.43  HGB 15.7* 12.7 13.7  HCT 47.2* 38.5 41.6  MCV 93.1 93.0 93.9  MCH 31.0 30.7 30.9  MCHC 33.3 33.0 32.9  RDW 12.8 12.9 12.9  PLT 289 234 275    Cardiac EnzymesNo results for input(s): TROPONINI in the last 168 hours. No results for input(s): TROPIPOC in the last 168 hours.   BNPNo results for input(s): BNP, PROBNP in the last 168 hours.   DDimer No results for input(s): DDIMER in the last 168 hours.   Radiology    CT Angio Chest PE W and/or Wo Contrast  Result Date: 04/29/2020 CLINICAL DATA:  Lower abdominal pain, bloody stools. EXAM: CT ANGIOGRAPHY CHEST CT ABDOMEN AND PELVIS WITH CONTRAST TECHNIQUE: Multidetector CT imaging of the chest was performed using the standard protocol during bolus administration of intravenous contrast. Multiplanar CT image reconstructions and MIPs were obtained to evaluate the vascular anatomy. Multidetector CT imaging of the abdomen and pelvis was performed using the standard protocol during bolus administration of intravenous contrast. CONTRAST:  135mL OMNIPAQUE IOHEXOL 350 MG/ML SOLN COMPARISON:  None. FINDINGS: CTA CHEST FINDINGS Cardiovascular: Filling defects are noted in peripheral branches of both pulmonary arteries consistent with acute pulmonary emboli. Atherosclerosis of thoracic aorta is noted without aneurysm or dissection. Mild cardiomegaly is noted. No pericardial effusion is noted. Mediastinum/Nodes: Large sliding-type hiatal hernia is noted.  Thyroid gland is unremarkable. No adenopathy is noted. Lungs/Pleura: No pneumothorax or pleural effusion is noted. Irregular density is noted peripherally in the left upper lobe with 16 x 11 mm irregular solid density; this may simply represent focal inflammation, but underlying neoplasm cannot be excluded. Follow-up CT scan in 3 weeks is recommended to ensure resolution or stability. Musculoskeletal: No chest wall abnormality. No acute or significant osseous findings. Review of the MIP images confirms the above findings. CT ABDOMEN and PELVIS FINDINGS Hepatobiliary: No focal liver abnormality is seen. No gallstones, gallbladder wall thickening, or biliary dilatation. Pancreas: Unremarkable. No pancreatic ductal dilatation or surrounding inflammatory changes. Spleen: Normal in size without focal abnormality. Adrenals/Urinary Tract: Adrenal glands are unremarkable. Bilateral renal cysts are noted. No hydronephrosis or renal obstruction is noted. No renal or ureteral calculi are noted. Urinary bladder is unremarkable. Stomach/Bowel: There is a large intussusception involving the terminal ileum, right and transverse colon. Possible 19 mm lipoma may be at the lead point. Sigmoid diverticulosis is noted without inflammation. Vascular/Lymphatic: Aortic atherosclerosis. No enlarged abdominal or pelvic lymph nodes. Reproductive: Uterus and bilateral adnexa are unremarkable. Other: No abdominal wall hernia or abnormality. No abdominopelvic ascites. Musculoskeletal: No acute or significant osseous findings. Review of the MIP images confirms the above findings. IMPRESSION: 1. Acute bilateral pulmonary emboli are noted. 2. Irregular density is noted peripherally in the left upper lobe with 16 x 11 mm irregular solid density; this may simply represent focal inflammation, but underlying neoplasm cannot be excluded. Follow-up CT scan in 3 weeks is recommended to ensure resolution or stability. 3. Large intussusception is seen  involving the terminal ileum, right and transverse colon. Possible 19 mm lipoma may be at the lead point. 4. Large sliding-type hiatal hernia is noted. 5. Sigmoid diverticulosis without inflammation. Critical Value/emergent results were called by telephone at the time of interpretation on 04/29/2020 at 3:17 pm to provider [facility] , who verbally acknowledged these results. Aortic Atherosclerosis (ICD10-I70.0). Electronically Signed   By: Marijo Conception M.D.   On: 04/29/2020 15:17   CT ABDOMEN PELVIS W CONTRAST  Result Date: 04/29/2020 CLINICAL DATA:  Lower abdominal pain, bloody stools. EXAM: CT ANGIOGRAPHY CHEST CT ABDOMEN AND PELVIS WITH CONTRAST TECHNIQUE: Multidetector CT imaging of the chest was performed using the standard protocol during  bolus administration of intravenous contrast. Multiplanar CT image reconstructions and MIPs were obtained to evaluate the vascular anatomy. Multidetector CT imaging of the abdomen and pelvis was performed using the standard protocol during bolus administration of intravenous contrast. CONTRAST:  110mL OMNIPAQUE IOHEXOL 350 MG/ML SOLN COMPARISON:  None. FINDINGS: CTA CHEST FINDINGS Cardiovascular: Filling defects are noted in peripheral branches of both pulmonary arteries consistent with acute pulmonary emboli. Atherosclerosis of thoracic aorta is noted without aneurysm or dissection. Mild cardiomegaly is noted. No pericardial effusion is noted. Mediastinum/Nodes: Large sliding-type hiatal hernia is noted. Thyroid gland is unremarkable. No adenopathy is noted. Lungs/Pleura: No pneumothorax or pleural effusion is noted. Irregular density is noted peripherally in the left upper lobe with 16 x 11 mm irregular solid density; this may simply represent focal inflammation, but underlying neoplasm cannot be excluded. Follow-up CT scan in 3 weeks is recommended to ensure resolution or stability. Musculoskeletal: No chest wall abnormality. No acute or significant osseous  findings. Review of the MIP images confirms the above findings. CT ABDOMEN and PELVIS FINDINGS Hepatobiliary: No focal liver abnormality is seen. No gallstones, gallbladder wall thickening, or biliary dilatation. Pancreas: Unremarkable. No pancreatic ductal dilatation or surrounding inflammatory changes. Spleen: Normal in size without focal abnormality. Adrenals/Urinary Tract: Adrenal glands are unremarkable. Bilateral renal cysts are noted. No hydronephrosis or renal obstruction is noted. No renal or ureteral calculi are noted. Urinary bladder is unremarkable. Stomach/Bowel: There is a large intussusception involving the terminal ileum, right and transverse colon. Possible 19 mm lipoma may be at the lead point. Sigmoid diverticulosis is noted without inflammation. Vascular/Lymphatic: Aortic atherosclerosis. No enlarged abdominal or pelvic lymph nodes. Reproductive: Uterus and bilateral adnexa are unremarkable. Other: No abdominal wall hernia or abnormality. No abdominopelvic ascites. Musculoskeletal: No acute or significant osseous findings. Review of the MIP images confirms the above findings. IMPRESSION: 1. Acute bilateral pulmonary emboli are noted. 2. Irregular density is noted peripherally in the left upper lobe with 16 x 11 mm irregular solid density; this may simply represent focal inflammation, but underlying neoplasm cannot be excluded. Follow-up CT scan in 3 weeks is recommended to ensure resolution or stability. 3. Large intussusception is seen involving the terminal ileum, right and transverse colon. Possible 19 mm lipoma may be at the lead point. 4. Large sliding-type hiatal hernia is noted. 5. Sigmoid diverticulosis without inflammation. Critical Value/emergent results were called by telephone at the time of interpretation on 04/29/2020 at 3:17 pm to provider [facility] , who verbally acknowledged these results. Aortic Atherosclerosis (ICD10-I70.0). Electronically Signed   By: Marijo Conception M.D.    On: 04/29/2020 15:17   DG Chest Portable 1 View  Result Date: 04/29/2020 CLINICAL DATA:  Shortness of breath. EXAM: PORTABLE CHEST 1 VIEW COMPARISON:  05/24/2017 FINDINGS: Large hiatal hernia, better characterized on priors. Similar lung hyperinflation. Small left pleural effusion versus scar. Bibasilar (left greater than right) opacities. Mild enlargement of the cardiac silhouette, similar to prior. Aortic atherosclerosis. No acute bony abnormality. IMPRESSION: 1. Small left pleural effusion versus scar. Bibasilar opacities may represent atelectasis, although aspiration and/or pneumonia is not excluded. 2. Hyperinflation/COPD. 3. Mild cardiomegaly. Electronically Signed   By: Margaretha Sheffield MD   On: 04/29/2020 12:41   VAS [place] LOWER EXTREMITY VENOUS (DVT)  Result Date: 04/30/2020  Lower Venous DVTStudy Indications: Edema.  Risk Factors: Confirmed PE. Comparison Study: No prior studies. Performing Technologist: Darlin Coco  Examination Guidelines: A complete evaluation includes B-mode imaging, spectral Doppler, color Doppler, and power Doppler as needed  of all accessible portions of each vessel. Bilateral testing is considered an integral part of a complete examination. Limited examinations for reoccurring indications may be performed as noted. The reflux portion of the exam is performed with the patient in reverse Trendelenburg.  +---------+---------------+---------+-----------+----------+--------------+ RIGHT    CompressibilityPhasicitySpontaneityPropertiesThrombus Aging +---------+---------------+---------+-----------+----------+--------------+ CFV      Full           Yes      Yes                                 +---------+---------------+---------+-----------+----------+--------------+ SFJ      Full                                                        +---------+---------------+---------+-----------+----------+--------------+ FV Prox  Full                                                         +---------+---------------+---------+-----------+----------+--------------+ FV Mid   Full                                                        +---------+---------------+---------+-----------+----------+--------------+ FV DistalFull                                                        +---------+---------------+---------+-----------+----------+--------------+ PFV      Full                                                        +---------+---------------+---------+-----------+----------+--------------+ POP      None           No       No         Mobile    Acute          +---------+---------------+---------+-----------+----------+--------------+ PTV      Partial        No       No                   Acute          +---------+---------------+---------+-----------+----------+--------------+ PERO     None           No       No                   Acute          +---------+---------------+---------+-----------+----------+--------------+   +---------+---------------+---------+-----------+----------+--------------+ LEFT     CompressibilityPhasicitySpontaneityPropertiesThrombus Aging +---------+---------------+---------+-----------+----------+--------------+ CFV      Full           Yes  Yes                                 +---------+---------------+---------+-----------+----------+--------------+ SFJ      Full                                                        +---------+---------------+---------+-----------+----------+--------------+ FV Prox  Full                                                        +---------+---------------+---------+-----------+----------+--------------+ FV Mid   Full                                                        +---------+---------------+---------+-----------+----------+--------------+ FV DistalFull                                                         +---------+---------------+---------+-----------+----------+--------------+ PFV      Full                                                        +---------+---------------+---------+-----------+----------+--------------+ POP      Full           Yes      Yes                                 +---------+---------------+---------+-----------+----------+--------------+ PTV      Full                                                        +---------+---------------+---------+-----------+----------+--------------+ PERO     Partial        No       No                   Acute          +---------+---------------+---------+-----------+----------+--------------+     Summary: RIGHT: - Findings consistent with acute deep vein thrombosis involving the right popliteal vein, right posterior tibial veins, and right peroneal veins. - A cystic structure is found in the popliteal fossa.  LEFT: - Findings consistent with acute deep vein thrombosis involving the left peroneal veins. - No cystic structure found in the popliteal fossa.  *See table(s) above for measurements and observations. Electronically signed by Harold Barban MD on 04/30/2020 at 9:32:39 PM.    Final     Cardiac Studies  ECHO:  Pending  Patient Profile     86 y.o. female is a 89with a hx of HTN, HLD, hiatal hernia, hypothyroidism, breast CA s/p lumpectomy, diverticulitis, pelvic fracture 2018 who is being seen today for the evaluation of pre-operative clearance at the request of Dr. Louanne Belton.  Assessment & Plan     Bilateral pulmonary emboli:   Resume heparin post op per pharmacy and transition to [place] when thought to be OK from a surgical standpoint.   Intussusception with possible colonic mass:  Surgery completed.  Pathology pending.   Hypokalemia/hypomagnesemia:  Replaced.    Brief sinus pauses/transient bundle branch block:  No sustained or hemodynamically significant arrhythmias.   I will review tele tomorrow but doubt  any other intervention or therapy will be needed.    For questions or updates, please contact [place] Please consult [email or web address] for contact info under Cardiology/STEMI.   Signed, Minus Breeding, MD  05/01/2020, 7:54 AM

## 2020-05-01 NOTE — Progress Notes (Signed)
[facility] for Heparin  Indication: bilateral pulmonary embolus  Allergies  Allergen Reactions  . Ibuprofen     REACTION: bleed    Patient Measurements: Height: 5\' 2"  (157.5 cm) Weight: 54.7 kg (120 lb 9.5 oz) IBW/kg (Calculated) : 50.1 Heparin Dosing Weight: 54.7 kg  Vital Signs: Temp: 98.5 F (36.9 C) (09/23 1148) Temp Source: Oral (09/23 1148) BP: 158/84 (09/23 1148) Pulse Rate: 74 (09/23 1130)  Labs: Recent Labs    04/29/20 1204 04/29/20 1204 04/29/20 1501 04/30/20 0046 04/30/20 2239 05/01/20 0315  HGB 15.7*   < >  --  12.7  --  13.7  HCT 47.2*  --   --  38.5  --  41.6  PLT 289  --   --  234  --  275  LABPROT  --   --   --   --   --  15.8*  INR  --   --   --   --   --  1.3*  HEPARINUNFRC  --   --   --   --  0.11*  --   CREATININE 0.66  --   --  0.50  --  0.49  TROPONINIHS 9  --  9  --   --   --    < > = values in this interval not displayed.    Estimated Creatinine Clearance: 37 mL/min (by C-G formula based on SCr of 0.49 mg/dL).   Medical History: Past Medical History:  Diagnosis Date  . Breast CA ([place])    left breast-invasive ductal ca stage 1-stopped arimidex 2008  . Diverticulitis of colon   . Hiatal hernia   . Hyperlipidemia   . Hypertension   . Hypothyroidism   . Osteoarthritis   . Osteopenia    DEXA 7/02  . Syncope 2005   CT head (-), ECHO essent. neg, stress test (-), carotid u/s (-)    Assessment: 86 y.o female presented to the ED on 9/21 with complaints of worsening diarrhea for 2 weeks with some bright red stool. She was diagnosed with  acute bilateral PE per CT angio 9/21 in ED.  Had hematochezia  thus did not start anticoagulation until 9/22.  She is also noted with an abnormal CT of the lung with possible neoplasm. Pharmacy consulted to dose IV Heparin for PE    She is now s/p right colectomy and heparin to restart at 5pm with no bolus   Goal of Therapy:  Heparin level 0.3-0.7  units/ml Monitor platelets by anticoagulation protocol: Yes   Plan:  -Restart heparin at 950 units/hr at 5pm -Heparin level in 6 hours and daily wth CBC daily  Hildred Laser, PharmD Clinical Pharmacist **Pharmacist phone directory can now be found on [email or web address] ([email or web address]).  Listed under [place].

## 2020-05-01 NOTE — Anesthesia Procedure Notes (Signed)
Procedure Name: Intubation Date/Time: 05/01/2020 9:12 AM Performed by: Wilburn Cornelia, CRNA Pre-anesthesia Checklist: Patient identified, Suction available, Patient being monitored, Timeout performed and Emergency Drugs available Patient Re-evaluated:Patient Re-evaluated prior to induction Oxygen Delivery Method: Circle system utilized Preoxygenation: Pre-oxygenation with 100% oxygen Induction Type: IV induction Ventilation: Mask ventilation without difficulty Laryngoscope Size: Mac and 3 Grade View: Grade I Tube type: Oral Tube size: 7.0 mm Number of attempts: 1 Airway Equipment and Method: Stylet Placement Confirmation: ETT inserted through vocal cords under direct vision,  positive ETCO2,  CO2 detector and breath sounds checked- equal and bilateral Secured at: 20 cm Tube secured with: Tape Dental Injury: Teeth and Oropharynx as per pre-operative assessment

## 2020-05-01 NOTE — Plan of Care (Signed)

## 2020-05-02 ENCOUNTER — Encounter (HOSPITAL_COMMUNITY): Payer: Self-pay | Admitting: Surgery

## 2020-05-02 LAB — COMPREHENSIVE METABOLIC PANEL
ALT: 8 U/L (ref 0–44)
AST: 15 U/L (ref 15–41)
Albumin: 2.1 g/dL — ABNORMAL LOW (ref 3.5–5.0)
Alkaline Phosphatase: 34 U/L — ABNORMAL LOW (ref 38–126)
Anion gap: 8 (ref 5–15)
BUN: <5 mg/dL — ABNORMAL LOW (ref 8–23)
CO2: 29 mmol/L (ref 22–32)
Calcium: 8 mg/dL — ABNORMAL LOW (ref 8.9–10.3)
Chloride: 99 mmol/L (ref 98–111)
Creatinine, Ser: 0.59 mg/dL (ref 0.44–1.00)
GFR calc Af Amer: >60 mL/min (ref >60)
GFR calc non Af Amer: >60 mL/min (ref >60)
Glucose, Bld: 118 mg/dL — ABNORMAL HIGH (ref 70–99)
Potassium: 3.5 mmol/L (ref 3.5–5.1)
Sodium: 136 mmol/L (ref 135–145)
Total Bilirubin: 0.5 mg/dL (ref 0.3–1.2)
Total Protein: 4.3 g/dL — ABNORMAL LOW (ref 6.5–8.1)

## 2020-05-02 LAB — CBC
HCT: 40 % (ref 36.0–46.0)
Hemoglobin: 13 g/dL (ref 12.0–15.0)
MCH: 30.7 pg (ref 26.0–34.0)
MCHC: 32.5 g/dL (ref 30.0–36.0)
MCV: 94.3 fL (ref 80.0–100.0)
Platelets: 275 10*3/uL (ref 150–400)
RBC: 4.24 MIL/uL (ref 3.87–5.11)
RDW: 13.2 % (ref 11.5–15.5)
WBC: 10.5 10*3/uL (ref 4.0–10.5)
nRBC: 0 % (ref 0.0–0.2)

## 2020-05-02 LAB — HEPARIN LEVEL (UNFRACTIONATED)
Heparin Unfractionated: 0.18 IU/mL — ABNORMAL LOW (ref 0.30–0.70)
Heparin Unfractionated: 0.45 IU/mL (ref 0.30–0.70)
Heparin Unfractionated: 0.28 IU/mL — ABNORMAL LOW (ref 0.30–0.70)

## 2020-05-02 LAB — PHOSPHORUS: Phosphorus: 2.4 mg/dL — ABNORMAL LOW (ref 2.5–4.6)

## 2020-05-02 LAB — MAGNESIUM: Magnesium: 1.6 mg/dL — ABNORMAL LOW (ref 1.7–2.4)

## 2020-05-02 MED ORDER — FLUTICASONE PROPIONATE 50 MCG/ACT NA SUSP: 2.0000 | Freq: Every day | NASAL | Status: DC | PRN

## 2020-05-02 MED ORDER — ACETAMINOPHEN 325 MG PO TABS
650.0000 mg | ORAL_TABLET | Freq: Four times a day (QID) | ORAL | Status: DC
  Administered 2020-05-02 – 2020-05-07 (×19): 650 mg via ORAL
  Filled 2020-05-02 (×20): qty 2

## 2020-05-02 MED ORDER — METHOCARBAMOL 500 MG PO TABS
500.0000 mg | ORAL_TABLET | Freq: Three times a day (TID) | ORAL | Status: DC
  Administered 2020-05-02 – 2020-05-07 (×16): 500 mg via ORAL
  Filled 2020-05-02 (×16): qty 1

## 2020-05-02 MED ORDER — POTASSIUM PHOSPHATES 15 MMOLE/5ML IV SOLN
30.0000 mmol | Freq: Once | INTRAVENOUS | Status: AC
  Administered 2020-05-02: 30 mmol via INTRAVENOUS
  Filled 2020-05-02: qty 10

## 2020-05-02 MED ORDER — MAGNESIUM SULFATE 2 GM/50ML IV SOLN
2.0000 g | Freq: Once | INTRAVENOUS | Status: AC
  Administered 2020-05-02: 2 g via INTRAVENOUS
  Filled 2020-05-02: qty 50

## 2020-05-02 MED ORDER — LEVOTHYROXINE SODIUM 75 MCG PO TABS
75.0000 ug | ORAL_TABLET | Freq: Every day | ORAL | Status: DC
  Administered 2020-05-03 – 2020-05-07 (×4): 75 ug via ORAL
  Filled 2020-05-02 (×4): qty 1

## 2020-05-02 NOTE — Progress Notes (Signed)
[facility] for Heparin  Indication: bilateral pulmonary embolus  Allergies  Allergen Reactions  . Ibuprofen     REACTION: bleed    Patient Measurements: Height: 5\' 2"  (157.5 cm) Weight: 56.9 kg (125 lb 7.1 oz) IBW/kg (Calculated) : 50.1 Heparin Dosing Weight: 54.7 kg  Vital Signs: Temp: 97.7 F (36.5 C) (09/24 1140) Temp Source: Oral (09/24 1140) BP: 136/85 (09/24 1140) Pulse Rate: 95 (09/24 1140)  Labs: Recent Labs    04/29/20 1501 04/30/20 0046 04/30/20 0046 04/30/20 2239 05/01/20 0315 05/02/20 0250 05/02/20 1254  HGB  --  12.7   < >  --  13.7 13.0  --   HCT  --  38.5  --   --  41.6 40.0  --   PLT  --  234  --   --  275 275  --   LABPROT  --   --   --   --  15.8*  --   --   INR  --   --   --   --  1.3*  --   --   HEPARINUNFRC  --   --   --  0.11*  --  0.18* 0.45  CREATININE  --  0.50  --   --  0.49 0.59  --   TROPONINIHS 9  --   --   --   --   --   --    < > = values in this interval not displayed.    Estimated Creatinine Clearance: 37 mL/min (by C-G formula based on SCr of 0.59 mg/dL).   Medical History: Past Medical History:  Diagnosis Date  . Breast CA ([place])    left breast-invasive ductal ca stage 1-stopped arimidex 2008  . Diverticulitis of colon   . Hiatal hernia   . Hyperlipidemia   . Hypertension   . Hypothyroidism   . Osteoarthritis   . Osteopenia    DEXA 7/02  . Syncope 2005   CT head (-), ECHO essent. neg, stress test (-), carotid u/s (-)    Assessment: 86 y.o female presented to the ED on 9/21 with complaints of worsening diarrhea for 2 weeks with some bright red stool. She was diagnosed with  acute bilateral PE per CT angio 9/21 in ED.  Had hematochezia  thus did not start anticoagulation until 9/22.  She is also noted with an abnormal CT of the lung with possible neoplasm. Pharmacy consulted to dose IV Heparin for PE    She is now s/p right colectomy 9/23 and heparin was restarted post procedure.  Plans noted for DOAC when okay with surgery. -heparin level at goal     Goal of Therapy:  Heparin level 0.3-0.7 units/ml Monitor platelets by anticoagulation protocol: Yes   Plan:  -Continue heparin at 1050 units/hr -Confirm heparin level today -Daily heparin level and CBC  Hildred Laser, PharmD Clinical Pharmacist **Pharmacist phone directory can now be found on [email or web address] ([email or web address]).  Listed under [place].

## 2020-05-02 NOTE — Progress Notes (Signed)
Progress Note  Patient Name: Ariel Medina Date of Encounter: 05/02/2020  Primary Cardiologist:   Minus Breeding, MD   Subjective   She looks good today.  Mild abdominal pain.  No SOB  Inpatient Medications    Scheduled Meds:  chlorhexidine  15 mL Mouth/Throat Once   Chlorhexidine Gluconate Cloth  6 each Topical Daily   gabapentin  300 mg Oral BID   saccharomyces boulardii  250 mg Oral BID   Continuous Infusions:  azithromycin Stopped (05/01/20 2244)   cefTRIAXone (ROCEPHIN)  IV Stopped (05/01/20 2130)   heparin 1,050 Units/hr (05/02/20 0506)   lactated ringers 75 mL/hr at 05/01/20 2144   PRN Meds: acetaminophen, hydrALAZINE, influenza vaccine adjuvanted, morphine injection, ondansetron **OR** ondansetron (ZOFRAN) IV, oxyCODONE   Vital Signs    Vitals:   05/01/20 2342 05/02/20 0424 05/02/20 0437 05/02/20 0758  BP: 124/74 116/76  (!) 144/82  Pulse:    82  Resp: 16 17 17 16   Temp: 98 F (36.7 C) 97.9 F (36.6 C)  98 F (36.7 C)  TempSrc: Oral Oral  Oral  SpO2: 97% 97% 96% 96%  Weight:   56.9 kg   Height:        Intake/Output Summary (Last 24 hours) at 05/02/2020 0853 Last data filed at 05/02/2020 0629 Gross per 24 hour  Intake 2311.01 ml  Output 1050 ml  Net 1261.01 ml   Filed Weights   04/29/20 1145 04/29/20 2240 05/02/20 0437  Weight: 55.1 kg 54.7 kg 56.9 kg    Telemetry    NSR, sinus with aberrant conduction (intermittent BBB)  - Personally Reviewed  ECG    NA - Personally Reviewed  Physical Exam   GEN: No  acute distress.   Neck: No  JVD Cardiac: RRR, no murmurs, rubs, or gallops.  Respiratory: Clear   to auscultation bilaterally. GI: Soft, nontender, non-distended, normal bowel sounds  MS:  No edema; No deformity. Neuro:   Nonfocal  Psych: Oriented and appropriate    Labs    Chemistry Recent Labs  Lab 04/25/20 1620 04/25/20 1620 04/29/20 1204 04/29/20 1204 04/30/20 0046 05/01/20 0315 05/02/20 0250  NA 135   < >  133*   < > 137 138 136  K 4.0   < > 3.3*   < > 3.4* 3.9 3.5  CL 92*   < > 91*   < > 100 102 99  CO2 32   < > 30   < > 27 27 29   GLUCOSE 95   < > 101*   < > 93 128* 118*  BUN 9   < > 8   < > <5* <5* <5*  CREATININE 0.64  --  0.66   < > 0.50 0.49 0.59  CALCIUM 9.6   < > 9.0   < > 8.0* 8.3* 8.0*  PROT 6.0*  --  6.0*  --   --   --  4.3*  ALBUMIN  --   --  3.3*  --   --   --  2.1*  AST 13  --  19  --   --   --  15  ALT 4*  --  8  --   --   --  8  ALKPHOS  --   --  46  --   --   --  34*  BILITOT 0.7  --  0.9  --   --   --  0.5  GFRNONAA  --   --  >  60   < > >60 >60 >60  GFRAA  --   --  >60   < > >60 >60 >60  ANIONGAP  --   --  12   < > 10 9 8    < > = values in this interval not displayed.     Hematology Recent Labs  Lab 04/30/20 0046 05/01/20 0315 05/02/20 0250  WBC 7.4 9.2 10.5  RBC 4.14 4.43 4.24  HGB 12.7 13.7 13.0  HCT 38.5 41.6 40.0  MCV 93.0 93.9 94.3  MCH 30.7 30.9 30.7  MCHC 33.0 32.9 32.5  RDW 12.9 12.9 13.2  PLT 234 275 275    Cardiac EnzymesNo results for input(s): TROPONINI in the last 168 hours. No results for input(s): TROPIPOC in the last 168 hours.   BNPNo results for input(s): BNP, PROBNP in the last 168 hours.   DDimer No results for input(s): DDIMER in the last 168 hours.   Radiology    VAS [place] LOWER EXTREMITY VENOUS (DVT)  Result Date: 04/30/2020  Lower Venous DVTStudy Indications: Edema.  Risk Factors: Confirmed PE. Comparison Study: No prior studies. Performing Technologist: Darlin Coco  Examination Guidelines: A complete evaluation includes B-mode imaging, spectral Doppler, color Doppler, and power Doppler as needed of all accessible portions of each vessel. Bilateral testing is considered an integral part of a complete examination. Limited examinations for reoccurring indications may be performed as noted. The reflux portion of the exam is performed with the patient in reverse Trendelenburg.   +---------+---------------+---------+-----------+----------+--------------+  RIGHT     Compressibility Phasicity Spontaneity Properties Thrombus Aging  +---------+---------------+---------+-----------+----------+--------------+  CFV       Full            Yes       Yes                                    +---------+---------------+---------+-----------+----------+--------------+  SFJ       Full                                                             +---------+---------------+---------+-----------+----------+--------------+  FV Prox   Full                                                             +---------+---------------+---------+-----------+----------+--------------+  FV Mid    Full                                                             +---------+---------------+---------+-----------+----------+--------------+  FV Distal Full                                                             +---------+---------------+---------+-----------+----------+--------------+  PFV       Full                                                             +---------+---------------+---------+-----------+----------+--------------+  POP       None            No        No          Mobile     Acute           +---------+---------------+---------+-----------+----------+--------------+  PTV       Partial         No        No                     Acute           +---------+---------------+---------+-----------+----------+--------------+  PERO      None            No        No                     Acute           +---------+---------------+---------+-----------+----------+--------------+   +---------+---------------+---------+-----------+----------+--------------+  LEFT      Compressibility Phasicity Spontaneity Properties Thrombus Aging  +---------+---------------+---------+-----------+----------+--------------+  CFV       Full            Yes       Yes                                     +---------+---------------+---------+-----------+----------+--------------+  SFJ       Full                                                             +---------+---------------+---------+-----------+----------+--------------+  FV Prox   Full                                                             +---------+---------------+---------+-----------+----------+--------------+  FV Mid    Full                                                             +---------+---------------+---------+-----------+----------+--------------+  FV Distal Full                                                             +---------+---------------+---------+-----------+----------+--------------+  PFV  Full                                                             +---------+---------------+---------+-----------+----------+--------------+  POP       Full            Yes       Yes                                    +---------+---------------+---------+-----------+----------+--------------+  PTV       Full                                                             +---------+---------------+---------+-----------+----------+--------------+  PERO      Partial         No        No                     Acute           +---------+---------------+---------+-----------+----------+--------------+     Summary: RIGHT: - Findings consistent with acute deep vein thrombosis involving the right popliteal vein, right posterior tibial veins, and right peroneal veins. - A cystic structure is found in the popliteal fossa.  LEFT: - Findings consistent with acute deep vein thrombosis involving the left peroneal veins. - No cystic structure found in the popliteal fossa.  *See table(s) above for measurements and observations. Electronically signed by Harold Barban MD on 04/30/2020 at 9:32:39 PM.    Final     Cardiac Studies   ECHO:  Pending  Patient Profile     86 y.o. female is a 89with a hx of HTN, HLD, hiatal hernia, hypothyroidism, breast CA  s/p lumpectomy, diverticulitis, pelvic fracture 2018 who is being seen today for the evaluation of pre-operative clearance at the request of Dr. Louanne Belton.  Assessment & Plan     Bilateral pulmonary emboli:    On heparin.  Transition to [place] when OK with surgical team.     Intussusception with possible colonic mass:  Surgery completed.  Pathology pending.   Brief sinus pauses/transient bundle branch block:   Intermittent BBB.  No ventricular tachycardia or sustained unstable rhythm.  No further work up.   Supplement potassium and magnesium per primary team.    [facility] will sign off.   Medication Recommendations:  Per primary team.  Other recommendations (labs, testing, etc):  None Follow up as an outpatient:  No cardiology follow up indicated   For questions or updates, please contact [facility] Please consult [email or web address] for contact info under Cardiology/STEMI.   Signed, Minus Breeding, MD  05/02/2020, 8:53 AM

## 2020-05-02 NOTE — Progress Notes (Signed)
[place] for Heparin  Indication: bilateral pulmonary embolus  Allergies  Allergen Reactions  . Ibuprofen     REACTION: bleed    Patient Measurements: Height: 5\' 2"  (157.5 cm) Weight: 56.9 kg (125 lb 7.1 oz) IBW/kg (Calculated) : 50.1 Heparin Dosing Weight: 54.7 kg  Vital Signs: Temp: 97.9 F (36.6 C) (09/24 0424) Temp Source: Oral (09/24 0424) BP: 116/76 (09/24 0424) Pulse Rate: 100 (09/23 2050)  Labs: Recent Labs    04/29/20 1204 04/29/20 1204 04/29/20 1501 04/30/20 0046 04/30/20 0046 04/30/20 2239 05/01/20 0315 05/02/20 0250  HGB 15.7*   < >  --  12.7   < >  --  13.7 13.0  HCT 47.2*   < >  --  38.5  --   --  41.6 40.0  PLT 289   < >  --  234  --   --  275 275  LABPROT  --   --   --   --   --   --  15.8*  --   INR  --   --   --   --   --   --  1.3*  --   HEPARINUNFRC  --   --   --   --   --  0.11*  --  0.18*  CREATININE 0.66   < >  --  0.50  --   --  0.49 0.59  TROPONINIHS 9  --  9  --   --   --   --   --    < > = values in this interval not displayed.    Estimated Creatinine Clearance: 37 mL/min (by C-G formula based on SCr of 0.59 mg/dL).   Medical History: Past Medical History:  Diagnosis Date  . Breast CA ([place])    left breast-invasive ductal ca stage 1-stopped arimidex 2008  . Diverticulitis of colon   . Hiatal hernia   . Hyperlipidemia   . Hypertension   . Hypothyroidism   . Osteoarthritis   . Osteopenia    DEXA 7/02  . Syncope 2005   CT head (-), ECHO essent. neg, stress test (-), carotid u/s (-)    Assessment: 86 y.o female presented to the ED on 9/21 with complaints of worsening diarrhea for 2 weeks with some bright red stool. She was diagnosed with  acute bilateral PE per CT angio 9/21 in ED.  Had hematochezia  thus did not start anticoagulation until 9/22.  She is also noted with an abnormal CT of the lung with possible neoplasm. Pharmacy consulted to dose IV Heparin for PE    She is now s/p right  colectomy and heparin to restart at 5pm with no bolus  9/24 AM update:  Heparin level low  CBC good   Goal of Therapy:  Heparin level 0.3-0.7 units/ml Monitor platelets by anticoagulation protocol: Yes   Plan:  Inc heparin to 1050 units/hr 1300 heparin level  Narda Bonds, PharmD, BCPS Clinical Pharmacist Phone: [phone]

## 2020-05-02 NOTE — Progress Notes (Signed)
[facility] Progress Note  1 Day Post-Op  Subjective: Patient reports abdomen is sore, but pain currently well controlled. Denies nausea but not passing any flatus or stool yet. She is very grateful for her care.   Objective: Vital signs in last 24 hours: Temp:  [97.4 F (36.3 C)-98.5 F (36.9 C)] 98 F (36.7 C) (09/24 0758) Pulse Rate:  [70-100] 82 (09/24 0758) Resp:  [13-21] 16 (09/24 0758) BP: (116-166)/(74-96) 144/82 (09/24 0758) SpO2:  [89 %-100 %] 96 % (09/24 0758) Weight:  [56.9 kg] 56.9 kg (09/24 0437) Last BM Date: 05/01/20  Intake/Output from previous day: 09/23 0701 - 09/24 0700 In: 2311 [I.V.:1959; IV Piggyback:350] Out: 1050 [Urine:1025; Blood:25] Intake/Output this shift: No intake/output data recorded.  PE: General: pleasant, WD, elderly female who is laying in bed in NAD Heart: regular, rate, and rhythm.  Normal s1,s2. No obvious murmurs, gallops, or rubs noted.  Palpable radial and pedal pulses bilaterally Lungs: CTAB, no wheezes, rhonchi, or rales noted.  Respiratory effort nonlabored Abd: soft, appropriately ttp, ND, +BS, incision with honeycomb present  Skin: warm and dry with no masses, lesions, or rashes Neuro: Cranial nerves 2-12 grossly intact, sensation grossly intact Psych: A&Ox3 with an appropriate affect.   Lab Results:  Recent Labs    05/01/20 0315 05/02/20 0250  WBC 9.2 10.5  HGB 13.7 13.0  HCT 41.6 40.0  PLT 275 275   BMET Recent Labs    05/01/20 0315 05/02/20 0250  NA 138 136  K 3.9 3.5  CL 102 99  CO2 27 29  GLUCOSE 128* 118*  BUN <5* <5*  CREATININE 0.49 0.59  CALCIUM 8.3* 8.0*   PT/INR Recent Labs    05/01/20 0315  LABPROT 15.8*  INR 1.3*   CMP     Component Value Date/Time   NA 136 05/02/2020 0250   NA 140 12/06/2016 0000   K 3.5 05/02/2020 0250   CL 99 05/02/2020 0250   CO2 29 05/02/2020 0250   GLUCOSE 118 (H) 05/02/2020 0250   BUN <5 (L) 05/02/2020 0250   BUN 14 12/06/2016 0000    CREATININE 0.59 05/02/2020 0250   CREATININE 0.64 04/25/2020 1620   CALCIUM 8.0 (L) 05/02/2020 0250   PROT 4.3 (L) 05/02/2020 0250   ALBUMIN 2.1 (L) 05/02/2020 0250   AST 15 05/02/2020 0250   ALT 8 05/02/2020 0250   ALKPHOS 34 (L) 05/02/2020 0250   BILITOT 0.5 05/02/2020 0250   GFRNONAA >60 05/02/2020 0250   GFRAA >60 05/02/2020 0250   Lipase     Component Value Date/Time   LIPASE 24 04/29/2020 1204       Studies/Results: VAS [place] LOWER EXTREMITY VENOUS (DVT)  Result Date: 04/30/2020  Lower Venous DVTStudy Indications: Edema.  Risk Factors: Confirmed PE. Comparison Study: No prior studies. Performing Technologist: Darlin Coco  Examination Guidelines: A complete evaluation includes B-mode imaging, spectral Doppler, color Doppler, and power Doppler as needed of all accessible portions of each vessel. Bilateral testing is considered an integral part of a complete examination. Limited examinations for reoccurring indications may be performed as noted. The reflux portion of the exam is performed with the patient in reverse Trendelenburg.  +---------+---------------+---------+-----------+----------+--------------+ RIGHT    CompressibilityPhasicitySpontaneityPropertiesThrombus Aging +---------+---------------+---------+-----------+----------+--------------+ CFV      Full           Yes      Yes                                 +---------+---------------+---------+-----------+----------+--------------+  SFJ      Full                                                        +---------+---------------+---------+-----------+----------+--------------+ FV Prox  Full                                                        +---------+---------------+---------+-----------+----------+--------------+ FV Mid   Full                                                        +---------+---------------+---------+-----------+----------+--------------+ FV DistalFull                                                         +---------+---------------+---------+-----------+----------+--------------+ PFV      Full                                                        +---------+---------------+---------+-----------+----------+--------------+ POP      None           No       No         Mobile    Acute          +---------+---------------+---------+-----------+----------+--------------+ PTV      Partial        No       No                   Acute          +---------+---------------+---------+-----------+----------+--------------+ PERO     None           No       No                   Acute          +---------+---------------+---------+-----------+----------+--------------+   +---------+---------------+---------+-----------+----------+--------------+ LEFT     CompressibilityPhasicitySpontaneityPropertiesThrombus Aging +---------+---------------+---------+-----------+----------+--------------+ CFV      Full           Yes      Yes                                 +---------+---------------+---------+-----------+----------+--------------+ SFJ      Full                                                        +---------+---------------+---------+-----------+----------+--------------+ FV Prox  Full                                                        +---------+---------------+---------+-----------+----------+--------------+  FV Mid   Full                                                        +---------+---------------+---------+-----------+----------+--------------+ FV DistalFull                                                        +---------+---------------+---------+-----------+----------+--------------+ PFV      Full                                                        +---------+---------------+---------+-----------+----------+--------------+ POP      Full           Yes      Yes                                  +---------+---------------+---------+-----------+----------+--------------+ PTV      Full                                                        +---------+---------------+---------+-----------+----------+--------------+ PERO     Partial        No       No                   Acute          +---------+---------------+---------+-----------+----------+--------------+     Summary: RIGHT: - Findings consistent with acute deep vein thrombosis involving the right popliteal vein, right posterior tibial veins, and right peroneal veins. - A cystic structure is found in the popliteal fossa.  LEFT: - Findings consistent with acute deep vein thrombosis involving the left peroneal veins. - No cystic structure found in the popliteal fossa.  *See table(s) above for measurements and observations. Electronically signed by Harold Barban MD on 04/30/2020 at 9:32:39 PM.    Final     Anti-infectives: Anti-infectives (From admission, onward)   Start     Dose/Rate Route Frequency Ordered Stop   04/29/20 2030  cefTRIAXone (ROCEPHIN) 1 g in sodium chloride 0.9 % 100 mL IVPB        1 g 200 mL/hr over 30 Minutes Intravenous Every 24 hours 04/29/20 2019     04/29/20 2030  azithromycin (ZITHROMAX) 500 mg in sodium chloride 0.9 % 250 mL IVPB        500 mg 250 mL/hr over 60 Minutes Intravenous Every 24 hours 04/29/20 2019 05/04/20 2029   04/29/20 2030  metroNIDAZOLE (FLAGYL) IVPB 500 mg  Status:  Discontinued        500 mg 100 mL/hr over 60 Minutes Intravenous Every 8 hours 04/29/20 2019 05/01/20 1141   04/29/20 1715  metroNIDAZOLE (FLAGYL) IVPB 500 mg        500 mg 100 mL/hr over 60 Minutes Intravenous  Once 04/29/20  1701 04/29/20 1904   04/29/20 1545  cefTRIAXone (ROCEPHIN) 1 g in sodium chloride 0.9 % 100 mL IVPB        1 g 200 mL/hr over 30 Minutes Intravenous  Once 04/29/20 1541 04/29/20 1626   04/29/20 1545  azithromycin (ZITHROMAX) 500 mg in sodium chloride 0.9 % 250 mL IVPB        500 mg 250 mL/hr over 60  Minutes Intravenous  Once 04/29/20 1541 04/29/20 1757       Assessment/Plan HTN Hypothyroidism HLD Hypokalemia and hypomagnesemia - replace with goal of K >4.0 and Mg >2.0   Bilateral PE with hypoxia - on supplemental O2, heparin gtt per primary team LUL mass on CT - PNA vs neoplasm, repeat CT in 3-4 weeks recommended  Intussusception  S/p open extended R hemicolectomy with stapled anastomosis 05/01/20 Dr. Ninfa Linden  - POD#1 - continue CLD until return in bowel function - continue entereg until return in bowel function  - mobilize with PT/OT today - foley already removed this AM - maximize non-narcotic pain control - surgical path pending   FEN: CLD, IVF  VTE: SCDs, heparin gtt  ID: rocephin/flagyl/azithromycin 9/21>>  LOS: 3 days    Norm Parcel , [facility] 05/02/2020, 8:29 AM Please see Amion for pager number during day hours 7:00am-4:30pm

## 2020-05-02 NOTE — Progress Notes (Signed)
PROGRESS NOTE  Ariel Medina PXT:[id number] DOB: Dec 03, 1927 DOA: 04/29/2020 PCP: Colon Branch, MD   LOS: 3 days   Brief narrative: As per HPI,  Ariel Medina is a 86 y.o. female with medical history significant for history of breast cancer, diverticulitis, hyperlipidemia, hypertension, hypothyroidism, osteoarthritis, osteopenia, CKD, who presented to the emergency department with complaints of worsening diarrhea for 2 weeks with some bright red stool.   Patient also had decreased appetite .  She was seen a few days ago in her PCP office for evaluation and had negative stool cultures and labs at that time.  Patient was revisited by PCP again for heme positive stools and was also noted to be hypoxic. In the emergency room, patient was noted to have bilateral PEs on CT angiography of her chest.  She also had a left upper lobe opacity which could represent a focal inflammation versus a underlying neoplasm.  She was started on empiric antibiotics for possible pneumonia.  She was also noted to have hematochezia and therefore was not placed on anticoagulation.  CT of her abdomen revealed intussusception of her terminal ileum and right-sided colon.  General surgery and GI were consulted.  Assessment/Plan:  Principal Problem:   Pulmonary embolism, bilateral (HCC) Active Problems:   Hypokalemia   Essential hypertension   Intussusception (HCC)   GI bleed   Abnormal CT scan of lung   Hypoxia   Acute pulmonary embolism (HCC)   Goals of care, counseling/discussion   Palliative care by specialist   DNR (do not resuscitate) discussion  Intussusception with mild rectal bleeding. CT scan of the abdomen with intusseption.  Status post extended right hemicolectomy on 05/01/2020.  Seen by surgery today.  On clears.  No definite mass was reported in the operative findings in the colon.  Pathology has been sent.  Ambulate the patient.  Physical therapy has been consulted.  Acute hypoxic respiratory failure  secondary to bilateral pulmonary embolism, bilateral lower extremity DVT, no right heart strain.  Possible mass versus pneumonia of the left upper lobe.  CT scan of the chest showed 16 x 11 mm irregular solid density in the left upper lobe.  Empirically on antibiotic.  Ultrasound of the lower extremity shows acute DVT involving the right popliteal vein right posterior tibial vein and right peroneal vein and left peroneal veins.  Patient has been started on a heparin drip.  Plan to change to oral anticoagulation when okay with surgery.   Hypokalemia Improved with replacement.  Potassium of 3.5 today.  Mild hypomagnesemia.  Magnesium still at 1.6.  We will continue to replenish.  Add IV magnesium sulfate today.  Hypophosphatemia.  Will replenish potassium phosphate.  Check levels in a.m.    Essential hypertension Blood pressure is stable so far.    Abnormal CT scan of lung Patient has a left upper lobe density which could be a focal inflammation versus neoplasm.  On empiric antibiotic for pneumonia,  Patient will need repeat CT scan in 3 to 4 weeks to ensure resolution of opacity.  Ethics.    Patient is a full code with full scope of care.   DVT prophylaxis: SCD's Start: 05/01/20 1142 Place TED hose Start: 05/01/20 1142   Code Status: Full code.    Family Communication:   None today.   Status is: Inpatient  Remains inpatient appropriate because:IV treatments appropriate due to intensity of illness or inability to take PO, Inpatient level of care appropriate due to severity of illness , intussusception  status post surgical intervention, pulmonary embolism on heparin drip  Dispo: The patient is from: Home              Anticipated d/c is to: Home with home health              Anticipated d/c date is: 2 to 3 days              Patient currently is not medically stable to d/c.  Consultants:  General surgery  Cardiology  Palliative care  PCCM   Procedures:  Open extended right  hemicolectomy on 05/01/2020 by Dr. Ninfa Linden  Antibiotics:  . Rocephin, IV, azithromycin 9/21>  Anti-infectives (From admission, onward)   Start     Dose/Rate Route Frequency Ordered Stop   04/29/20 2030  cefTRIAXone (ROCEPHIN) 1 g in sodium chloride 0.9 % 100 mL IVPB        1 g 200 mL/hr over 30 Minutes Intravenous Every 24 hours 04/29/20 2019     04/29/20 2030  azithromycin (ZITHROMAX) 500 mg in sodium chloride 0.9 % 250 mL IVPB        500 mg 250 mL/hr over 60 Minutes Intravenous Every 24 hours 04/29/20 2019 05/04/20 2029   04/29/20 2030  metroNIDAZOLE (FLAGYL) IVPB 500 mg  Status:  Discontinued        500 mg 100 mL/hr over 60 Minutes Intravenous Every 8 hours 04/29/20 2019 05/01/20 1141   04/29/20 1715  metroNIDAZOLE (FLAGYL) IVPB 500 mg        500 mg 100 mL/hr over 60 Minutes Intravenous  Once 04/29/20 1701 04/29/20 1904   04/29/20 1545  cefTRIAXone (ROCEPHIN) 1 g in sodium chloride 0.9 % 100 mL IVPB        1 g 200 mL/hr over 30 Minutes Intravenous  Once 04/29/20 1541 04/29/20 1626   04/29/20 1545  azithromycin (ZITHROMAX) 500 mg in sodium chloride 0.9 % 250 mL IVPB        500 mg 250 mL/hr over 60 Minutes Intravenous  Once 04/29/20 1541 04/29/20 1757     Subjective: Today, patient was seen and examined at bedside.  Denies overt abdominal pain.  Denies any nausea vomiting.  Has not had flatus or bowel movement yet.  No shortness of breath.    Objective: Vitals:   05/02/20 0758 05/02/20 1140  BP: (!) 144/82 136/85  Pulse: 82 95  Resp: 16 20  Temp: 98 F (36.7 C) 97.7 F (36.5 C)  SpO2: 96% 99%    Intake/Output Summary (Last 24 hours) at 05/02/2020 1437 Last data filed at 05/02/2020 0900 Gross per 24 hour  Intake 1631.01 ml  Output 725 ml  Net 906.01 ml   Filed Weights   04/29/20 1145 04/29/20 2240 05/02/20 0437  Weight: 55.1 kg 54.7 kg 56.9 kg   Body mass index is 22.94 kg/m.   Physical Exam:  GENERAL: Patient is alert awake and oriented. Not in obvious  distress.on nasal canula oxygen.  Communicative. HENT: No scleral pallor or icterus. Pupils equally reactive to light. Oral mucosa is moist NECK: is supple, no gross swelling noted. CHEST:   Diminished breath sounds bilaterally.  No obvious crackles or wheezes noted. CVS: S1 and S2 heard, no murmur. Regular rate and rhythm.  ABDOMEN: Soft, sluggish bowel sounds, appropriately tender on palpation EXTREMITIES: No edema.  Non specific tenderness on palpation of the bilateral lower extremities. CNS: Cranial nerves are intact. No focal motor deficits. SKIN: warm and dry   Data Review: I have personally  reviewed the following laboratory data and studies,  CBC: Recent Labs  Lab 04/25/20 1620 04/29/20 1204 04/30/20 0046 05/01/20 0315 05/02/20 0250  WBC 7.5 9.4 7.4 9.2 10.5  NEUTROABS 5,190 7.3  --   --   --   HGB 15.6* 15.7* 12.7 13.7 13.0  HCT 45.9* 47.2* 38.5 41.6 40.0  MCV 91.6 93.1 93.0 93.9 94.3  PLT 257 289 234 275 025   Basic Metabolic Panel: Recent Labs  Lab 04/25/20 1620 04/29/20 1204 04/30/20 0046 05/01/20 0315 05/02/20 0250  NA 135 133* 137 138 136  K 4.0 3.3* 3.4* 3.9 3.5  CL 92* 91* 100 102 99  CO2 32 30 27 27 29   GLUCOSE 95 101* 93 128* 118*  BUN 9 8 <5* <5* <5*  CREATININE 0.64 0.66 0.50 0.49 0.59  CALCIUM 9.6 9.0 8.0* 8.3* 8.0*  MG  --   --  1.6* 2.2 1.6*  PHOS  --   --   --   --  2.4*   Liver Function Tests: Recent Labs  Lab 04/25/20 1620 04/29/20 1204 05/02/20 0250  AST 13 19 15   ALT 4* 8 8  ALKPHOS  --  46 34*  BILITOT 0.7 0.9 0.5  PROT 6.0* 6.0* 4.3*  ALBUMIN  --  3.3* 2.1*   Recent Labs  Lab 04/25/20 1620 04/29/20 1204  LIPASE 8 24   No results for input(s): AMMONIA in the last 168 hours. Cardiac Enzymes: No results for input(s): CKTOTAL, CKMB, CKMBINDEX, TROPONINI in the last 168 hours. BNP (last 3 results) No results for input(s): BNP in the last 8760 hours.  ProBNP (last 3 results) No results for input(s): PROBNP in the last 8760  hours.  CBG: No results for input(s): GLUCAP in the last 168 hours. Recent Results (from the past 240 hour(s))  SARS Coronavirus 2 by RT PCR (hospital order, performed in [facility] hospital lab) Nasopharyngeal Nasopharyngeal Swab     Status: None   Collection Time: 04/29/20  3:01 PM   Specimen: Nasopharyngeal Swab  Result Value Ref Range Status   SARS Coronavirus 2 NEGATIVE NEGATIVE Final    Comment: (NOTE) SARS-CoV-2 target nucleic acids are NOT DETECTED.  The SARS-CoV-2 RNA is generally detectable in upper and lower respiratory specimens during the acute phase of infection. The lowest concentration of SARS-CoV-2 viral copies this assay can detect is 250 copies / mL. A negative result does not preclude SARS-CoV-2 infection and should not be used as the sole basis for treatment or other patient management decisions.  A negative result may occur with improper specimen collection / handling, submission of specimen other than nasopharyngeal swab, presence of viral mutation(s) within the areas targeted by this assay, and inadequate number of viral copies (<250 copies / mL). A negative result must be combined with clinical observations, patient history, and epidemiological information.  Fact Sheet for Patients:   StrictlyIdeas.no  Fact Sheet for Healthcare Providers: BankingDealers.co.za  This test is not yet approved or  cleared by the [place] FDA and has been authorized for detection and/or diagnosis of SARS-CoV-2 by FDA under an Emergency Use Authorization (EUA).  This EUA will remain in effect (meaning this test can be used) for the duration of the COVID-19 declaration under Section 564(b)(1) of the Act, 21 U.S.C. section 360bbb-3(b)(1), unless the authorization is terminated or revoked sooner.  Performed at [facility], [address] [place], [place] [address]   Surgical pcr screen     Status: None  Collection Time: 05/01/20  3:22 AM   Specimen: Nasal Mucosa; Nasal Swab  Result Value Ref Range Status   MRSA, PCR NEGATIVE NEGATIVE Final   Staphylococcus aureus NEGATIVE NEGATIVE Final    Comment: (NOTE) The Xpert SA Assay (FDA approved for NASAL specimens in patients 62 years of age and older), is one component of a comprehensive surveillance program. It is not intended to diagnose infection nor to guide or monitor treatment. Performed at [facility], [place] [address], [place], [place] [address]      Studies: VAS [place] LOWER EXTREMITY VENOUS (DVT)  Result Date: 04/30/2020  Lower Venous DVTStudy Indications: Edema.  Risk Factors: Confirmed PE. Comparison Study: No prior studies. Performing Technologist: Darlin Coco  Examination Guidelines: A complete evaluation includes B-mode imaging, spectral Doppler, color Doppler, and power Doppler as needed of all accessible portions of each vessel. Bilateral testing is considered an integral part of a complete examination. Limited examinations for reoccurring indications may be performed as noted. The reflux portion of the exam is performed with the patient in reverse Trendelenburg.  +---------+---------------+---------+-----------+----------+--------------+ RIGHT    CompressibilityPhasicitySpontaneityPropertiesThrombus Aging +---------+---------------+---------+-----------+----------+--------------+ CFV      Full           Yes      Yes                                 +---------+---------------+---------+-----------+----------+--------------+ SFJ      Full                                                        +---------+---------------+---------+-----------+----------+--------------+ FV Prox  Full                                                        +---------+---------------+---------+-----------+----------+--------------+ FV Mid   Full                                                         +---------+---------------+---------+-----------+----------+--------------+ FV DistalFull                                                        +---------+---------------+---------+-----------+----------+--------------+ PFV      Full                                                        +---------+---------------+---------+-----------+----------+--------------+ POP      None           No       No         Mobile    Acute          +---------+---------------+---------+-----------+----------+--------------+  PTV      Partial        No       No                   Acute          +---------+---------------+---------+-----------+----------+--------------+ PERO     None           No       No                   Acute          +---------+---------------+---------+-----------+----------+--------------+   +---------+---------------+---------+-----------+----------+--------------+ LEFT     CompressibilityPhasicitySpontaneityPropertiesThrombus Aging +---------+---------------+---------+-----------+----------+--------------+ CFV      Full           Yes      Yes                                 +---------+---------------+---------+-----------+----------+--------------+ SFJ      Full                                                        +---------+---------------+---------+-----------+----------+--------------+ FV Prox  Full                                                        +---------+---------------+---------+-----------+----------+--------------+ FV Mid   Full                                                        +---------+---------------+---------+-----------+----------+--------------+ FV DistalFull                                                        +---------+---------------+---------+-----------+----------+--------------+ PFV      Full                                                         +---------+---------------+---------+-----------+----------+--------------+ POP      Full           Yes      Yes                                 +---------+---------------+---------+-----------+----------+--------------+ PTV      Full                                                        +---------+---------------+---------+-----------+----------+--------------+ PERO  Partial        No       No                   Acute          +---------+---------------+---------+-----------+----------+--------------+     Summary: RIGHT: - Findings consistent with acute deep vein thrombosis involving the right popliteal vein, right posterior tibial veins, and right peroneal veins. - A cystic structure is found in the popliteal fossa.  LEFT: - Findings consistent with acute deep vein thrombosis involving the left peroneal veins. - No cystic structure found in the popliteal fossa.  *See table(s) above for measurements and observations. Electronically signed by Harold Barban MD on 04/30/2020 at 9:32:39 PM.    Final       Flora Lipps, MD  [facility] 05/02/2020

## 2020-05-02 NOTE — Progress Notes (Signed)
[place] for Heparin  Indication: bilateral pulmonary embolus  Allergies  Allergen Reactions  . Ibuprofen     REACTION: bleed    Patient Measurements: Height: 5\' 2"  (157.5 cm) Weight: 56.9 kg (125 lb 7.1 oz) IBW/kg (Calculated) : 50.1 Heparin Dosing Weight: 54.7 kg  Vital Signs: Temp: 98.6 F (37 C) (09/24 2032) Temp Source: Oral (09/24 2032) BP: 163/95 (09/24 1500) Pulse Rate: 93 (09/24 1500)  Labs: Recent Labs    04/30/20 0046 04/30/20 2239 05/01/20 0315 05/02/20 0250 05/02/20 1254 05/02/20 2156  HGB 12.7  --  13.7 13.0  --   --   HCT 38.5  --  41.6 40.0  --   --   PLT 234  --  275 275  --   --   LABPROT  --   --  15.8*  --   --   --   INR  --   --  1.3*  --   --   --   HEPARINUNFRC  --    < >  --  0.18* 0.45 0.28*  CREATININE 0.50  --  0.49 0.59  --   --    < > = values in this interval not displayed.    Estimated Creatinine Clearance: 37 mL/min (by C-G formula based on SCr of 0.59 mg/dL).   Assessment: 86 y.o female presented to the ED on 9/21 with complaints of worsening diarrhea for 2 weeks with some bright red stool. She was diagnosed with  acute bilateral PE per CT angio 9/21 in ED.  Had hematochezia  thus did not start anticoagulation until 9/22.  She is also noted with an abnormal CT of the lung with possible neoplasm. Pharmacy consulted to dose IV Heparin for PE    She is now s/p right colectomy 9/23 and heparin was restarted post procedure. Plans noted for DOAC when okay with surgery. -heparin level slightly low this evening at 0.28  Goal of Therapy:  Heparin level 0.3-0.7 units/ml Monitor platelets by anticoagulation protocol: Yes   Plan:  Slightly increase heparin to 1100 units/hr Daily hep lvl cbc  Barth Kirks, PharmD, BCPS, BCCCP Clinical Pharmacist [phone]  Please check AMION for all [place] numbers  05/02/2020 10:51 PM

## 2020-05-03 LAB — COMPREHENSIVE METABOLIC PANEL
ALT: 7 U/L (ref 0–44)
AST: 15 U/L (ref 15–41)
Albumin: 2 g/dL — ABNORMAL LOW (ref 3.5–5.0)
Alkaline Phosphatase: 33 U/L — ABNORMAL LOW (ref 38–126)
Anion gap: 7 (ref 5–15)
BUN: <5 mg/dL — ABNORMAL LOW (ref 8–23)
CO2: 28 mmol/L (ref 22–32)
Calcium: 8.1 mg/dL — ABNORMAL LOW (ref 8.9–10.3)
Chloride: 99 mmol/L (ref 98–111)
Creatinine, Ser: 0.52 mg/dL (ref 0.44–1.00)
GFR calc Af Amer: >60 mL/min (ref >60)
GFR calc non Af Amer: >60 mL/min (ref >60)
Glucose, Bld: 122 mg/dL — ABNORMAL HIGH (ref 70–99)
Potassium: 4.1 mmol/L (ref 3.5–5.1)
Sodium: 134 mmol/L — ABNORMAL LOW (ref 135–145)
Total Bilirubin: 0.5 mg/dL (ref 0.3–1.2)
Total Protein: 4.2 g/dL — ABNORMAL LOW (ref 6.5–8.1)

## 2020-05-03 LAB — CBC
HCT: 37.9 % (ref 36.0–46.0)
Hemoglobin: 12.3 g/dL (ref 12.0–15.0)
MCH: 30.6 pg (ref 26.0–34.0)
MCHC: 32.5 g/dL (ref 30.0–36.0)
MCV: 94.3 fL (ref 80.0–100.0)
Platelets: 253 10*3/uL (ref 150–400)
RBC: 4.02 MIL/uL (ref 3.87–5.11)
RDW: 13.1 % (ref 11.5–15.5)
WBC: 9.6 10*3/uL (ref 4.0–10.5)
nRBC: 0 % (ref 0.0–0.2)

## 2020-05-03 LAB — HEPARIN LEVEL (UNFRACTIONATED)
Heparin Unfractionated: 0.35 IU/mL (ref 0.30–0.70)
Heparin Unfractionated: 0.4 IU/mL (ref 0.30–0.70)

## 2020-05-03 LAB — MAGNESIUM: Magnesium: 2 mg/dL (ref 1.7–2.4)

## 2020-05-03 LAB — PHOSPHORUS: Phosphorus: 4.1 mg/dL (ref 2.5–4.6)

## 2020-05-03 MED ORDER — APIXABAN (ELIQUIS) EDUCATION KIT FOR DVT/PE PATIENTS
PACK | Freq: Once | Status: DC
  Filled 2020-05-03: qty 1

## 2020-05-03 MED ORDER — APIXABAN 5 MG PO TABS
10.0000 mg | ORAL_TABLET | Freq: Two times a day (BID) | ORAL | Status: DC
  Administered 2020-05-03 – 2020-05-07 (×9): 10 mg via ORAL
  Filled 2020-05-03 (×9): qty 2

## 2020-05-03 MED ORDER — APIXABAN 5 MG PO TABS: 5.0000 mg | ORAL_TABLET | Freq: Two times a day (BID) | ORAL | Status: DC

## 2020-05-03 NOTE — Progress Notes (Signed)
[place] for Heparin transitioning to Eliquis Indication: bilateral pulmonary embolus  Allergies  Allergen Reactions  . Ibuprofen     REACTION: bleed    Patient Measurements: Height: 5\' 2"  (157.5 cm) Weight: 56.9 kg (125 lb 7.1 oz) IBW/kg (Calculated) : 50.1 Heparin Dosing Weight: 54.7 kg  Vital Signs: Temp: 97.8 F (36.6 C) (09/25 0751) Temp Source: Oral (09/25 0751) BP: 138/91 (09/25 0751) Pulse Rate: 81 (09/25 0751)  Labs: Recent Labs    04/30/20 2239 05/01/20 0315 05/02/20 0250 05/02/20 1254 05/02/20 2156 05/03/20 0141 05/03/20 0958  HGB   < > 13.7 13.0  --   --  12.3  --   HCT  --  41.6 40.0  --   --  37.9  --   PLT  --  275 275  --   --  253  --   LABPROT  --  15.8*  --   --   --   --   --   INR  --  1.3*  --   --   --   --   --   HEPARINUNFRC   < >  --  0.18*   < > 0.28* 0.35 0.40  CREATININE  --  0.49 0.59  --   --  0.52  --    < > = values in this interval not displayed.    Estimated Creatinine Clearance: 37 mL/min (by C-G formula based on SCr of 0.52 mg/dL).   Medical History: Past Medical History:  Diagnosis Date  . Breast CA ([place])    left breast-invasive ductal ca stage 1-stopped arimidex 2008  . Diverticulitis of colon   . Hiatal hernia   . Hyperlipidemia   . Hypertension   . Hypothyroidism   . Osteoarthritis   . Osteopenia    DEXA 7/02  . Syncope 2005   CT head (-), ECHO essent. neg, stress test (-), carotid u/s (-)    Assessment: 86 y.o female presented to the ED on 9/21 with complaints of worsening diarrhea for 2 weeks with some bright red stool. She was diagnosed with  acute bilateral PE per CT angio 9/21 in ED.  Had hematochezia  thus did not start anticoagulation until 9/22. She is also noted with an abnormal CT of the lung with possible neoplasm. Pharmacy was consulted to dose IV Heparin for PE and is now consulted to transition to Eliquis.  She is now s/p right colectomy. Heparin was  therapeutic overnight, CBC stable. No bleeding per RN.  Goal of Therapy:  Monitor platelets by anticoagulation protocol: Yes   Plan:  Stop heparin drip Start apixaban 10 mg twice daily x7 days, followed by apixaban 5 mg twice daily Monitor CBC, s/s bleeding  Romilda Garret, PharmD PGY1 Acute Care Pharmacy Resident Phone: [phone] 05/03/2020 10:40 AM  Please check [email or web address] for unit specific pharmacy phone numbers.

## 2020-05-03 NOTE — Progress Notes (Signed)
[place] for Heparin  Indication: bilateral pulmonary embolus  Allergies  Allergen Reactions  . Ibuprofen     REACTION: bleed    Patient Measurements: Height: 5\' 2"  (157.5 cm) Weight: 56.9 kg (125 lb 7.1 oz) IBW/kg (Calculated) : 50.1 Heparin Dosing Weight: 54.7 kg  Vital Signs: Temp: 97.9 F (36.6 C) (09/25 0054) Temp Source: Oral (09/25 0054) BP: 137/85 (09/25 0054) Pulse Rate: 93 (09/24 1500)  Labs: Recent Labs    04/30/20 2239 05/01/20 0315 05/02/20 0250 05/02/20 0250 05/02/20 1254 05/02/20 2156 05/03/20 0141  HGB   < > 13.7 13.0  --   --   --  12.3  HCT  --  41.6 40.0  --   --   --  37.9  PLT  --  275 275  --   --   --  253  LABPROT  --  15.8*  --   --   --   --   --   INR  --  1.3*  --   --   --   --   --   HEPARINUNFRC   < >  --  0.18*   < > 0.45 0.28* 0.35  CREATININE  --  0.49 0.59  --   --   --   --    < > = values in this interval not displayed.    Estimated Creatinine Clearance: 37 mL/min (by C-G formula based on SCr of 0.59 mg/dL).   Medical History: Past Medical History:  Diagnosis Date  . Breast CA ([place])    left breast-invasive ductal ca stage 1-stopped arimidex 2008  . Diverticulitis of colon   . Hiatal hernia   . Hyperlipidemia   . Hypertension   . Hypothyroidism   . Osteoarthritis   . Osteopenia    DEXA 7/02  . Syncope 2005   CT head (-), ECHO essent. neg, stress test (-), carotid u/s (-)    Assessment: 86 y.o female presented to the ED on 9/21 with complaints of worsening diarrhea for 2 weeks with some bright red stool. She was diagnosed with  acute bilateral PE per CT angio 9/21 in ED.  Had hematochezia  thus did not start anticoagulation until 9/22.  She is also noted with an abnormal CT of the lung with possible neoplasm. Pharmacy consulted to dose IV Heparin for PE    She is now s/p right colectomy and heparin to restart at 5pm with no bolus  9/25 AM update:  Heparin therapeutic  CBC  good   Goal of Therapy:  Heparin level 0.3-0.7 units/ml Monitor platelets by anticoagulation protocol: Yes   Plan:  Cont heparin 1100 units/hr 1200 heparin level  Narda Bonds, PharmD, [place] Pharmacist Phone: [phone]

## 2020-05-03 NOTE — Progress Notes (Signed)
2 Days Post-Op   Subjective/Chief Complaint: Patient has had some diarrhea and is getting ready to use the bathroom again Working with PT Pain controlled   Objective: Vital signs in last 24 hours: Temp:  [97.7 F (36.5 C)-98.6 F (37 C)] 97.8 F (36.6 C) (09/25 0751) Pulse Rate:  [81-95] 81 (09/25 0751) Resp:  [17-20] 19 (09/25 0751) BP: (136-166)/(85-95) 138/91 (09/25 0751) SpO2:  [96 %-99 %] 96 % (09/25 0751) Last BM Date: 05/01/20  Intake/Output from previous day: 09/24 0701 - 09/25 0700 In: 120 [P.O.:120] Out: -  Intake/Output this shift: No intake/output data recorded.  Did not examine wound since she is on bedside commode  Lab Results:  Recent Labs    05/02/20 0250 05/03/20 0141  WBC 10.5 9.6  HGB 13.0 12.3  HCT 40.0 37.9  PLT 275 253   BMET Recent Labs    05/02/20 0250 05/03/20 0141  NA 136 134*  K 3.5 4.1  CL 99 99  CO2 29 28  GLUCOSE 118* 122*  BUN <5* <5*  CREATININE 0.59 0.52  CALCIUM 8.0* 8.1*   PT/INR Recent Labs    05/01/20 0315  LABPROT 15.8*  INR 1.3*   ABG No results for input(s): PHART, HCO3 in the last 72 hours.  Invalid input(s): PCO2, PO2  Studies/Results: No results found.  Anti-infectives: Anti-infectives (From admission, onward)   Start     Dose/Rate Route Frequency Ordered Stop   04/29/20 2030  cefTRIAXone (ROCEPHIN) 1 g in sodium chloride 0.9 % 100 mL IVPB        1 g 200 mL/hr over 30 Minutes Intravenous Every 24 hours 04/29/20 2019     04/29/20 2030  azithromycin (ZITHROMAX) 500 mg in sodium chloride 0.9 % 250 mL IVPB        500 mg 250 mL/hr over 60 Minutes Intravenous Every 24 hours 04/29/20 2019 05/04/20 2029   04/29/20 2030  metroNIDAZOLE (FLAGYL) IVPB 500 mg  Status:  Discontinued        500 mg 100 mL/hr over 60 Minutes Intravenous Every 8 hours 04/29/20 2019 05/01/20 1141   04/29/20 1715  metroNIDAZOLE (FLAGYL) IVPB 500 mg        500 mg 100 mL/hr over 60 Minutes Intravenous  Once 04/29/20 1701 04/29/20  1904   04/29/20 1545  cefTRIAXone (ROCEPHIN) 1 g in sodium chloride 0.9 % 100 mL IVPB        1 g 200 mL/hr over 30 Minutes Intravenous  Once 04/29/20 1541 04/29/20 1626   04/29/20 1545  azithromycin (ZITHROMAX) 500 mg in sodium chloride 0.9 % 250 mL IVPB        500 mg 250 mL/hr over 60 Minutes Intravenous  Once 04/29/20 1541 04/29/20 1757      Assessment/Plan: HTN Hypothyroidism HLD Hypokalemia and hypomagnesemia - replace with goal of K >4.0 and Mg >2.0  Bilateral PE with hypoxia- on supplemental O2, heparin gtt per primary team LUL mass on CT- PNA vs neoplasm, repeat CT in 3-4 weeks recommended  Intussusception S/p open extended R hemicolectomy with stapled anastomosis 05/01/20 Dr. Ninfa Linden  - POD#2 - full liquids, advance as tolerated - will stop Entereg - mobilize with PT/OT today - foley already removed this AM - maximize non-narcotic pain control - surgical path pending   FEN: CLD, IVF  VTE: SCDs, heparin gtt  ID: rocephin/flagyl/azithromycin 9/21>> - antibiotics per primary team.  No surgical need for antibiotics  LOS: 4 days    Ariel Medina 05/03/2020

## 2020-05-03 NOTE — Discharge Instructions (Signed)
Information on my medicine - ELIQUIS (apixaban)  This medication education was reviewed with me or my healthcare representative as part of my discharge preparation.   Why was Eliquis prescribed for you? Eliquis was prescribed to treat blood clots that may have been found in the veins of your legs (deep vein thrombosis) or in your lungs (pulmonary embolism) and to reduce the risk of them occurring again.  What do You need to know about Eliquis ? The starting dose is 10 mg (two 5 mg tablets) taken TWICE daily for the FIRST SEVEN (7) DAYS, then on 05/10/2020 the dose is reduced to ONE 5 mg tablet taken TWICE daily.  Eliquis may be taken with or without food.   Try to take the dose about the same time in the morning and in the evening. If you have difficulty swallowing the tablet whole please discuss with your pharmacist how to take the medication safely.  Take Eliquis exactly as prescribed and DO NOT stop taking Eliquis without talking to the doctor who prescribed the medication.  Stopping may increase your risk of developing a new blood clot.  Refill your prescription before you run out.  After discharge, you should have regular check-up appointments with your healthcare provider that is prescribing your Eliquis.    What do you do if you miss a dose? If a dose of ELIQUIS is not taken at the scheduled time, take it as soon as possible on the same day and twice-daily administration should be resumed. The dose should not be doubled to make up for a missed dose.  Important Safety Information A possible side effect of Eliquis is bleeding. You should call your healthcare provider right away if you experience any of the following: ? Bleeding from an injury or your nose that does not stop. ? Unusual colored urine (red or dark [place]) or unusual colored stools (red or black). ? Unusual bruising for unknown reasons. ? A serious fall or if you hit your head (even if there is no bleeding).  Some  medicines may interact with Eliquis and might increase your risk of bleeding or clotting while on Eliquis. To help avoid this, consult your healthcare provider or pharmacist prior to using any new prescription or non-prescription medications, including herbals, vitamins, non-steroidal anti-inflammatory drugs (NSAIDs) and supplements.  This website has more information on Eliquis (apixaban): http://[email or web address].com/eliquis/home

## 2020-05-03 NOTE — Evaluation (Signed)
Physical Therapy Evaluation Patient Details Name: Ariel Medina MRN: [id number] DOB: 1928/06/05 Today's Date: 05/03/2020   History of Present Illness  Pt is a 86 y.o. female admitted 04/29/20 with diarrhea and bloody stool. Chest imaging with bilateral PEs; LUL density suspect for inflammation vs neoplasm. Abdominal CT with intussusception of her terminal ileum and R-sided colon. S/p extended right hemicolectomy on 9/23. Ultrasound with RLE DVTs. PMH includes OA, HTN, HLD, breast CA, syncope.    Clinical Impression  Pt presents with an overall decrease in functional mobility secondary to above. PTA, pt mod indep ambulating with RW/SPC, sister and grandson assist with ADLs/IADLs as needed. Today, pt able to initiate transfer and gait training with RW and minA; limited by generalized weakness, decreased activity tolerance, impaired balance and pain. Educ on abdominal precautions and positioning for comfort, IS use, and importance of mobility. Pt would benefit from continued acute PT services to maximize functional mobility and independence prior to d/c with SNF-level therapies. Pt in agreement and requesting [facility] SNF due to good previous experience.     Follow Up Recommendations SNF;Supervision for mobility/OOB (requests [employer] [employer])    Equipment Recommendations  None recommended by PT    Recommendations for Other Services       Precautions / Restrictions Precautions Precautions: Fall;Other (comment) Precaution Comments: Bowel/bladder urgency/incontinence (has Depends in room); abdominal precautions for comfort Restrictions Weight Bearing Restrictions: No      Mobility  Bed Mobility               General bed mobility comments: Received sitting in recliner  Transfers Overall transfer level: Needs assistance Equipment used: Rolling walker (2 wheeled) Transfers: Sit to/from Stand Sit to Stand: Min assist;Min guard         General transfer comment: Initial minA to  elevate trunk from recliner, close min guard 2x standing from [facility]; increased time and effort secondary to pain and fatigue  Ambulation/Gait Ambulation/Gait assistance: Min guard Gait Distance (Feet): 12 Feet Assistive device: Rolling walker (2 wheeled) Gait Pattern/deviations: Step-to pattern;Trunk flexed;Antalgic Gait velocity: Decreased   General Gait Details: Slow, fatigued gait with RW and close min guard, ambulating 6' forwards and 6' backwards; pt declining further distance secondary to fatigue. SpO2 >/88% on RA  Stairs            Wheelchair Mobility    Modified Rankin (Stroke Patients Only)       Balance Overall balance assessment: Needs assistance   Sitting balance-Leahy Scale: Fair       Standing balance-Leahy Scale: Poor Standing balance comment: Reliant on BUE support; dependent for posterior pericare                             Pertinent Vitals/Pain Pain Assessment: Faces Faces Pain Scale: Hurts little more Pain Location: abdomen Pain Descriptors / Indicators: Sore Pain Intervention(s): Monitored during session    Home Living Family/patient expects to be discharged to:: Private residence Living Arrangements: Other (Comment) (grandson ) Available Help at Discharge: Family;Available PRN/intermittently Type of Home: House Home Access: Level entry     Home Layout: One level Home Equipment: Walker - 2 wheels;Cane - single point;Shower seat;Grab bars - tub/shower;Bedside commode      Prior Function Level of Independence: Needs assistance   Gait / Transfers Assistance Needed: Uses RW in home, SPC out to mailbox; enjoys sitting and porch, has not been able to garden much anymore  ADL's / Homemaking Assistance Needed:  patient's sister lives next door and she assists patient with bathing, IADLs "anything I need" grandson does grocery shopping    Comments: grandson lives with her but he works     Journalist, newspaper   Dominant Hand: Right     Extremity/Trunk Assessment   Upper Extremity Assessment Upper Extremity Assessment: Generalized weakness    Lower Extremity Assessment Lower Extremity Assessment: Generalized weakness    Cervical / Trunk Assessment Cervical / Trunk Assessment: Kyphotic  Communication   Communication: HOH  Cognition Arousal/Alertness: Awake/alert Behavior During Therapy: WFL for tasks assessed/performed Overall Cognitive Status: No family/caregiver present to determine baseline cognitive functioning                                 General Comments: Apparent short-term memory deficits, likely baseline; following commands and interacting appropriately      General Comments General comments (skin integrity, edema, etc.): Post-mobility BP 145/96, HR 95, SpO2 89-93% on RA when good pleth    Exercises Other Exercises Other Exercises: Incentive spirometer x10 (pt requiring intermittent cues for technique) - pulling 500-750 mL   Assessment/Plan    PT Assessment Patient needs continued PT services  PT Problem List Decreased strength;Decreased activity tolerance;Decreased balance;Decreased mobility;Decreased knowledge of use of DME;Pain       PT Treatment Interventions DME instruction;Gait training;Stair training;Functional mobility training;Therapeutic activities;Therapeutic exercise;Balance training;Patient/family education    PT Goals (Current goals can be found in the Care Plan section)  Acute Rehab PT Goals Patient Stated Goal: Wants to regain strength at post-acute rehab before return home; "I hope to stay in my house and do for myself as long as possible" PT Goal Formulation: With patient Time For Goal Achievement: 05/17/20 Potential to Achieve Goals: Good    Frequency Min 2X/week   Barriers to discharge Decreased caregiver support      Co-evaluation               AM-PAC PT "6 Clicks" Mobility  Outcome Measure Help needed turning from your back to your side  while in a flat bed without using bedrails?: A Little Help needed moving from lying on your back to sitting on the side of a flat bed without using bedrails?: A Lot Help needed moving to and from a bed to a chair (including a wheelchair)?: A Little Help needed standing up from a chair using your arms (e.g., wheelchair or bedside chair)?: A Little Help needed to walk in hospital room?: A Little Help needed climbing 3-5 steps with a railing? : A Lot 6 Click Score: 16    End of Session   Activity Tolerance: Patient tolerated treatment well;Patient limited by fatigue;Patient limited by pain Patient left: in chair;with call bell/phone within reach;with chair alarm set Nurse Communication: Mobility status PT Visit Diagnosis: Other abnormalities of gait and mobility (R26.89);Muscle weakness (generalized) (M62.81);Pain    Time: 2395-3202 PT Time Calculation (min) (ACUTE ONLY): 25 min   Charges:   PT Evaluation $PT Eval Moderate Complexity: 1 Mod PT Treatments $Therapeutic Activity: 8-22 mins       Mabeline Caras, PT, DPT Acute Rehabilitation Services  Pager [phone] Office [place] 05/03/2020, 10:43 AM

## 2020-05-03 NOTE — Progress Notes (Signed)
PROGRESS NOTE  Ariel Medina INO:[id number] DOB: 1928/03/07 DOA: 04/29/2020 PCP: Colon Branch, MD   LOS: 4 days   Brief narrative: As per HPI,  Ariel Medina is a 86 y.o. female with medical history significant for history of breast cancer, diverticulitis, hyperlipidemia, hypertension, hypothyroidism, osteoarthritis, osteopenia, CKD, who presented to the emergency department with complaints of worsening diarrhea for 2 weeks with some bright red stool.   Patient also had decreased appetite .  She was seen a few days ago in her PCP office for evaluation and had negative stool cultures and labs at that time.  Patient was revisited by PCP again for heme positive stools and was also noted to be hypoxic. In the emergency room, patient was noted to have bilateral PEs on CT angiography of her chest.  She also had a left upper lobe opacity which could represent a focal inflammation versus a underlying neoplasm.  She was started on empiric antibiotics for possible pneumonia.  She was also noted to have hematochezia and therefore was not placed on anticoagulation.  CT of her abdomen revealed intussusception of her terminal ileum and right-sided colon.  General surgery and GI were consulted.  Assessment/Plan:  Principal Problem:   Pulmonary embolism, bilateral (HCC) Active Problems:   Hypokalemia   Essential hypertension   Intussusception (HCC)   GI bleed   Abnormal CT scan of lung   Hypoxia   Acute pulmonary embolism (HCC)   Goals of care, counseling/discussion   Palliative care by specialist   DNR (do not resuscitate) discussion  Intussusception with mild rectal bleeding. CT scan of the abdomen with intusseption.  Status post open extended right hemicolectomy on 05/01/2020.  Seen by surgery today.  On clears.  Diet advancement as per general surgery.  No definite mass was reported in the operative findings in the colon.  Pathology has been sent and is pending.  Ambulate the patient.  Physical  therapy has been consulted.   Acute hypoxic respiratory failure secondary to bilateral pulmonary embolism,  no right heart strain.  Possible mass versus pneumonia of the left upper lobe.  CT scan of the chest showed 16 x 11 mm irregular solid density in the left upper lobe.  Empirically on iV antibiotic.  Ultrasound of the lower extremity shows acute DVT involving the right popliteal vein right posterior tibial vein and right peroneal vein and left peroneal veins.  On  heparin drip.  Plan to change to oral anticoagulation when okay with surgery.  Bilateral lower extremity DVT. On heparin drip. Can change to oral if ok with surgery   Hypokalemia Improved with replacement.  Potassium of 4.1 today  Mild hypomagnesemia.  Received IV magnesium sulfate yesterday.  Magnesium 2.0 today.  Hypophosphatemia.  Replenished of potassium phosphate.  Phosphorus of 4.1 today.    Essential hypertension On hydrochlorothiazide at home.  Due to electrolyte issues will hold off with that.  Closely monitor.  If needed consider as needed hydralazine.     Abnormal CT scan of lung Patient has a left upper lobe density which could be a focal inflammation versus neoplasm.  On empiric antibiotic for pneumonia,  Patient will need repeat CT scan in 3 to 4 weeks to ensure resolution of opacity.  DVT prophylaxis: SCD's Start: 05/01/20 1142 Place TED hose Start: 05/01/20 1142 Heparin drip  Code Status: Full code.     Family Communication:  None today.  Status is: Inpatient  Remains inpatient appropriate because:IV treatments appropriate due to intensity of  illness or inability to take PO, Inpatient level of care appropriate due to severity of illness , intussusception status post surgical intervention, pulmonary embolism on heparin drip  Dispo: The patient is from: Home              Anticipated d/c is to: Home with home health, physical therapy evaluation pending.              Anticipated d/c date is: 2 to 3 days               Patient currently is not medically stable to d/c.  Consultants:  General surgery  Cardiology  Palliative care  PCCM   Procedures:  Open extended right hemicolectomy on 05/01/2020 by Dr. Ninfa Linden  Antibiotics:  . Rocephin, IV, azithromycin 9/21>  Anti-infectives (From admission, onward)   Start     Dose/Rate Route Frequency Ordered Stop   04/29/20 2030  cefTRIAXone (ROCEPHIN) 1 g in sodium chloride 0.9 % 100 mL IVPB        1 g 200 mL/hr over 30 Minutes Intravenous Every 24 hours 04/29/20 2019     04/29/20 2030  azithromycin (ZITHROMAX) 500 mg in sodium chloride 0.9 % 250 mL IVPB        500 mg 250 mL/hr over 60 Minutes Intravenous Every 24 hours 04/29/20 2019 05/04/20 2029   04/29/20 2030  metroNIDAZOLE (FLAGYL) IVPB 500 mg  Status:  Discontinued        500 mg 100 mL/hr over 60 Minutes Intravenous Every 8 hours 04/29/20 2019 05/01/20 1141   04/29/20 1715  metroNIDAZOLE (FLAGYL) IVPB 500 mg        500 mg 100 mL/hr over 60 Minutes Intravenous  Once 04/29/20 1701 04/29/20 1904   04/29/20 1545  cefTRIAXone (ROCEPHIN) 1 g in sodium chloride 0.9 % 100 mL IVPB        1 g 200 mL/hr over 30 Minutes Intravenous  Once 04/29/20 1541 04/29/20 1626   04/29/20 1545  azithromycin (ZITHROMAX) 500 mg in sodium chloride 0.9 % 250 mL IVPB        500 mg 250 mL/hr over 60 Minutes Intravenous  Once 04/29/20 1541 04/29/20 1757     Subjective: Today, patient was seen and examined at bedside.  Patient denies any nausea vomiting or abdominal pain.  Has had a bowel movement.  Tolerating liquids.  Mild shortness of breath.  Encouraged her to use incentive spirometer.  Objective: Vitals:   05/03/20 0526 05/03/20 0751  BP: (!) 166/85 (!) 138/91  Pulse: 84 81  Resp: 20 19  Temp: 98 F (36.7 C) 97.8 F (36.6 C)  SpO2: 97% 96%    Intake/Output Summary (Last 24 hours) at 05/03/2020 0840 Last data filed at 05/02/2020 0900 Gross per 24 hour  Intake 120 ml  Output --  Net 120 ml    Filed Weights   04/29/20 1145 04/29/20 2240 05/02/20 0437  Weight: 55.1 kg 54.7 kg 56.9 kg   Body mass index is 22.94 kg/m.   Physical Exam:  General:  Average built, not in obvious distress, alert awake and oriented.  On nasal cannula oxygen at 1 L/min  HENT:   No scleral pallor or icterus noted. Oral mucosa is moist.  Chest:    Diminished breath sounds bilaterally. No crackles or wheezes.  CVS: S1 &S2 heard. No murmur.  Regular rate and rhythm. Abdomen: Soft, appropriately tender on palpation, bowel sounds present. Extremities: No cyanosis, clubbing mild tenderness on palpation Psych: Alert, awake and oriented,  normal mood CNS:  No cranial nerve deficits.  Power equal in all extremities.   Skin: Warm and dry.  No rashes noted.   Data Review: I have personally reviewed the following laboratory data and studies,  CBC: Recent Labs  Lab 04/29/20 1204 04/30/20 0046 05/01/20 0315 05/02/20 0250 05/03/20 0141  WBC 9.4 7.4 9.2 10.5 9.6  NEUTROABS 7.3  --   --   --   --   HGB 15.7* 12.7 13.7 13.0 12.3  HCT 47.2* 38.5 41.6 40.0 37.9  MCV 93.1 93.0 93.9 94.3 94.3  PLT 289 234 275 275 993   Basic Metabolic Panel: Recent Labs  Lab 04/29/20 1204 04/30/20 0046 05/01/20 0315 05/02/20 0250 05/03/20 0141  NA 133* 137 138 136 134*  K 3.3* 3.4* 3.9 3.5 4.1  CL 91* 100 102 99 99  CO2 30 27 27 29 28   GLUCOSE 101* 93 128* 118* 122*  BUN 8 <5* <5* <5* <5*  CREATININE 0.66 0.50 0.49 0.59 0.52  CALCIUM 9.0 8.0* 8.3* 8.0* 8.1*  MG  --  1.6* 2.2 1.6* 2.0  PHOS  --   --   --  2.4* 4.1   Liver Function Tests: Recent Labs  Lab 04/29/20 1204 05/02/20 0250 05/03/20 0141  AST 19 15 15   ALT 8 8 7   ALKPHOS 46 34* 33*  BILITOT 0.9 0.5 0.5  PROT 6.0* 4.3* 4.2*  ALBUMIN 3.3* 2.1* 2.0*   Recent Labs  Lab 04/29/20 1204  LIPASE 24   No results for input(s): AMMONIA in the last 168 hours. Cardiac Enzymes: No results for input(s): CKTOTAL, CKMB, CKMBINDEX, TROPONINI in the last  168 hours. BNP (last 3 results) No results for input(s): BNP in the last 8760 hours.  ProBNP (last 3 results) No results for input(s): PROBNP in the last 8760 hours.  CBG: No results for input(s): GLUCAP in the last 168 hours. Recent Results (from the past 240 hour(s))  SARS Coronavirus 2 by RT PCR (hospital order, performed in [facility] hospital lab) Nasopharyngeal Nasopharyngeal Swab     Status: None   Collection Time: 04/29/20  3:01 PM   Specimen: Nasopharyngeal Swab  Result Value Ref Range Status   SARS Coronavirus 2 NEGATIVE NEGATIVE Final    Comment: (NOTE) SARS-CoV-2 target nucleic acids are NOT DETECTED.  The SARS-CoV-2 RNA is generally detectable in upper and lower respiratory specimens during the acute phase of infection. The lowest concentration of SARS-CoV-2 viral copies this assay can detect is 250 copies / mL. A negative result does not preclude SARS-CoV-2 infection and should not be used as the sole basis for treatment or other patient management decisions.  A negative result may occur with improper specimen collection / handling, submission of specimen other than nasopharyngeal swab, presence of viral mutation(s) within the areas targeted by this assay, and inadequate number of viral copies (<250 copies / mL). A negative result must be combined with clinical observations, patient history, and epidemiological information.  Fact Sheet for Patients:   StrictlyIdeas.no  Fact Sheet for Healthcare Providers: BankingDealers.co.za  This test is not yet approved or  cleared by the [place] FDA and has been authorized for detection and/or diagnosis of SARS-CoV-2 by FDA under an Emergency Use Authorization (EUA).  This EUA will remain in effect (meaning this test can be used) for the duration of the COVID-19 declaration under Section 564(b)(1) of the Act, 21 U.S.C. section 360bbb-3(b)(1), unless the authorization is  terminated or revoked sooner.  Performed at [facility], [address] [place], [place] [address]   Surgical pcr screen     Status: None   Collection Time: 05/01/20  3:22 AM   Specimen: Nasal Mucosa; Nasal Swab  Result Value Ref Range Status   MRSA, PCR NEGATIVE NEGATIVE Final   Staphylococcus aureus NEGATIVE NEGATIVE Final    Comment: (NOTE) The Xpert SA Assay (FDA approved for NASAL specimens in patients 93 years of age and older), is one component of a comprehensive surveillance program. It is not intended to diagnose infection nor to guide or monitor treatment. Performed at [facility], [place] [address], [place], [place] [address]      Studies: No results found.    Flora Lipps, MD  [facility] 05/03/2020

## 2020-05-03 NOTE — Progress Notes (Signed)
Occupational Therapy Evaluation  Patient with functional deficits listed below impacting safety and independence with self care. Patient incontinent of diarrhea in bed upon arrival. Min A for bed mobility, mod A stand pivot to [facility] and total A for peri care. Patient min A with rolling walker to take few steps from [facility] to recliner with cues for sequencing and eccentric control into chair. Patient reports feeling weaker than her baseline and is agreeable to rehab before returning home. Recommend continued acute OT services for ADL and transfer training, activity tolerance, balance in order to facilitate D/C to venue listed below.    05/03/20 1100  OT Visit Information  Last OT Received On 05/03/20  Assistance Needed +1 (chair follow helpful)  History of Present Illness Pt is a 86 y.o. female admitted 04/29/20 with diarrhea and bloody stool. Chest imaging with bilateral PEs; LUL density suspect for inflammation vs neoplasm. Abdominal CT with intussusception of her terminal ileum and R-sided colon. S/p extended right hemicolectomy on 9/23. Ultrasound with RLE DVTs. PMH includes OA, HTN, HLD, breast CA, syncope.  Precautions  Precautions Fall;Other (comment)  Precaution Comments Bowel/bladder urgency/incontinence (has Depends in room); abdominal precautions for comfort  Restrictions  Weight Bearing Restrictions No  Home Living  Family/patient expects to be discharged to: Private residence  Living Arrangements Other (Comment) (grandson)  Available Help at Discharge Family;Available PRN/intermittently  Type of Home House  Home Access Level entry  Home Layout One level  Bathroom Shower/Tub Tub/shower unit;Walk-in shower  Bathroom Toilet Handicapped [place] - 2 wheels;Cane - single point;Shower seat;Grab bars - tub/shower;BSC  Prior Function  Level of Independence Needs assistance  Gait / Transfers Assistance Needed Uses RW in home, SPC out to mailbox; enjoys sitting and  porch, has not been able to garden much anymore  ADL's / [place] patient's sister lives next door and she assists patient with bathing, IADLs "anything I need" grandson does grocery shopping    Comments grandson lives with her but he works  Engineer, petroleum HOH  Pain Assessment  Pain Assessment Faces  Faces Pain Scale 4  Pain Location abdomen  Pain Descriptors / Indicators Sore  Pain Intervention(s) Monitored during session  Cognition  Arousal/Alertness Awake/alert  Behavior During Therapy WFL for tasks assessed/performed  Overall Cognitive Status No family/caregiver present to determine baseline cognitive functioning  General Comments patient following directions appropriately  Upper Extremity Assessment  Upper Extremity Assessment Generalized weakness  Lower Extremity Assessment  Lower Extremity Assessment Defer to PT evaluation  Cervical / Trunk Assessment  Cervical / Trunk Assessment Kyphotic  ADL  Overall ADL's  Needs assistance/impaired  Eating/Feeding Set up;Sitting  Grooming Set up;Sitting  Upper Body Bathing Minimal assistance;Sitting  Lower Body Bathing Maximal assistance;Sitting/lateral leans;Sit to/from stand  Upper Body Dressing  Minimal assistance;Sitting  Upper Body Dressing Details (indicate cue type and reason) don clean gown  Lower Body Dressing Maximal assistance;Sitting/lateral leans;Sit to/from Retail buyer Minimal assistance;Stand-pivot;Cueing for safety;Cueing for sequencing;BSC;RW;Moderate assistance  Toilet Transfer Details (indicate cue type and reason) initial transfer to commode without AD requiring mod A for stability and cues for sequencing. from [facility] to recliner pt use walker with min A to stabilize and for eccentric control into chair  Toileting- Clothing Manipulation and Hygiene Total assistance;Sitting/lateral lean;Sit to/from stand  Toileting - Clothing Manipulation Details (indicate cue type and reason) pt  having diarrhea, require total A to clean legs and perianal area due to decreased activity tolerance, standing balance  Functional  mobility during ADLs Minimal assistance;Rolling walker;Cueing for safety;Cueing for sequencing  General ADL Comments patient requiring increased assistance with self care due to decreased strength, balance, activity tolerance, safety awareness  Bed Mobility  Overal bed mobility Needs Assistance  Bed Mobility Supine to Sit  Supine to sit HOB elevated;Min assist  General bed mobility comments cues for sequencing, min A with trunk management  Transfers  Overall transfer level Needs assistance  Equipment used Rolling walker (2 wheeled)  Transfers Sit to/from Stand  Sit to Stand Min assist;Mod assist  General transfer comment initial mod A with sit to stand from EOB to transfer to [facility] without AD, from [facility] to recliner patient min A with walker cues for sequencing  Balance  Overall balance assessment Needs assistance  Sitting-balance support Feet supported  Sitting balance-Leahy Scale Fair  Standing balance support Bilateral upper extremity supported;During functional activity  Standing balance-Leahy Scale Poor  Standing balance comment Reliant on BUE support; dependent for posterior pericare  OT - End of Session  Equipment Utilized During Treatment Rolling walker;Oxygen  Activity Tolerance Patient tolerated treatment well  Patient left in chair;with call bell/phone within reach;with chair alarm set  Nurse Communication Mobility status;Other (comment) (diarrhea)  OT Assessment  OT Recommendation/Assessment Patient needs continued OT Services  OT Visit Diagnosis Unsteadiness on feet (R26.81);Other abnormalities of gait and mobility (R26.89);Muscle weakness (generalized) (M62.81);Pain  Pain - part of body  (abdomen)  OT Problem List Decreased strength;Decreased activity tolerance;Impaired balance (sitting and/or standing);Decreased safety awareness;Decreased  knowledge of precautions;Pain  OT Plan  OT Frequency (ACUTE ONLY) Min 2X/week  OT Treatment/Interventions (ACUTE ONLY) Self-care/ADL training;Therapeutic exercise;Energy conservation;DME and/or AE instruction;Therapeutic activities;Patient/family education;Balance training  AM-PAC OT "6 Clicks" Daily Activity Outcome Measure (Version 2)  Help from another person eating meals? 3  Help from another person taking care of personal grooming? 3  Help from another person toileting, which includes using toliet, bedpan, or urinal? 2  Help from another person bathing (including washing, rinsing, drying)? 2  Help from another person to put on and taking off regular upper body clothing? 3  Help from another person to put on and taking off regular lower body clothing? 2  6 Click Score 15  OT Recommendation  Follow Up Recommendations SNF;Supervision/Assistance - 24 hour  OT Equipment None recommended by OT  Individuals Consulted  Consulted and Agree with Results and Recommendations Patient  Acute Rehab OT Goals  Patient Stated Goal Wants to regain strength at post-acute rehab before return home; "I hope to stay in my house and do for myself as long as possible"  OT Goal Formulation With patient  Time For Goal Achievement 05/17/20  Potential to Achieve Goals Good  OT Time Calculation  OT Start Time (ACUTE ONLY) 0824  OT Stop Time (ACUTE ONLY) 0914  OT Time Calculation (min) 50 min  OT General Charges  $OT Visit 1 Visit  OT Evaluation  $OT Eval Moderate Complexity 1 Mod  OT Treatments  $Self Care/Home Management  23-37 mins  Written Expression  Dominant Hand Right   Delbert Phenix OT OT pager: [phone]

## 2020-05-04 LAB — CBC
HCT: 35.8 % — ABNORMAL LOW (ref 36.0–46.0)
Hemoglobin: 11.6 g/dL — ABNORMAL LOW (ref 12.0–15.0)
MCH: 30.9 pg (ref 26.0–34.0)
MCHC: 32.4 g/dL (ref 30.0–36.0)
MCV: 95.5 fL (ref 80.0–100.0)
Platelets: 262 10*3/uL (ref 150–400)
RBC: 3.75 MIL/uL — ABNORMAL LOW (ref 3.87–5.11)
RDW: 13.2 % (ref 11.5–15.5)
WBC: 8.5 10*3/uL (ref 4.0–10.5)
nRBC: 0 % (ref 0.0–0.2)

## 2020-05-04 LAB — BASIC METABOLIC PANEL
Anion gap: 7 (ref 5–15)
BUN: 7 mg/dL — ABNORMAL LOW (ref 8–23)
CO2: 28 mmol/L (ref 22–32)
Calcium: 8.4 mg/dL — ABNORMAL LOW (ref 8.9–10.3)
Chloride: 102 mmol/L (ref 98–111)
Creatinine, Ser: 0.54 mg/dL (ref 0.44–1.00)
GFR calc Af Amer: >60 mL/min (ref >60)
GFR calc non Af Amer: >60 mL/min (ref >60)
Glucose, Bld: 118 mg/dL — ABNORMAL HIGH (ref 70–99)
Potassium: 3.8 mmol/L (ref 3.5–5.1)
Sodium: 137 mmol/L (ref 135–145)

## 2020-05-04 LAB — MAGNESIUM: Magnesium: 1.9 mg/dL (ref 1.7–2.4)

## 2020-05-04 LAB — GLUCOSE, CAPILLARY: Glucose-Capillary: 155 mg/dL — ABNORMAL HIGH (ref 70–99)

## 2020-05-04 NOTE — TOC Progression Note (Signed)
Transition of Care [facility]) - Progression Note    Patient Details  Name: Ariel Medina MRN: [id number] Date of Birth: January 16, 1928  Transition of Care [facility]) CM/SW [place], [place] Phone Number: 05/04/2020, 10:48 AM  Clinical Narrative:     CSW contacted [facility]/SNF- waiting on response to confirm bed offer.  Thurmond Butts, MSW, [place] Clinical Social Worker   Expected Discharge Plan: Skilled Nursing Facility Barriers to Discharge: SNF Pending bed offer  Expected Discharge Plan and Services Expected Discharge Plan: [place] In-house Referral: Clinical Social Work     Living arrangements for the past 2 months: Single Family Home                                       Social Determinants of Health (SDOH) Interventions    Readmission Risk Interventions No flowsheet data found.

## 2020-05-04 NOTE — Progress Notes (Signed)
3 Days Post-Op   Subjective/Chief Complaint: Patient has had loose bowel movements  Working with PT Pain controlled   Objective: Vital signs in last 24 hours: Temp:  [97.6 F (36.4 C)-98.6 F (37 C)] 98.6 F (37 C) (09/26 0838) Pulse Rate:  [87-92] 87 (09/26 0838) Resp:  [16-21] 16 (09/26 0838) BP: (126-148)/(73-86) 146/80 (09/26 0838) SpO2:  [96 %-98 %] 98 % (09/26 0838) Last BM Date: 05/03/20  Intake/Output from previous day: No intake/output data recorded. Intake/Output this shift: Total I/O In: -  Out: 800 [Urine:800]  Constitutional:  WDWN in NAD, conversant, no obvious deformities; lying in bed comfortably Eyes:  Pupils equal, round; sclera anicteric; moist conjunctiva; no lid lag HENT:  Oral mucosa moist; good dentition  Neck:  No masses palpated, trachea midline; no thyromegaly Lungs:  CTA bilaterally; normal respiratory effort CV:  Regular rate and rhythm; no murmurs; extremities well-perfused with no edema Abd:  +bowel sounds, soft, minimal tenderness; incision c/d/i through honeycomb Musc:  Unable to assess gait; no apparent clubbing or cyanosis in extremities Lymphatic:  No palpable cervical or axillary lymphadenopathy Skin:  Warm, dry; no sign of jaundice Psychiatric - alert and oriented x 4; calm mood and affect   Lab Results:  Recent Labs    05/03/20 0141 05/04/20 0301  WBC 9.6 8.5  HGB 12.3 11.6*  HCT 37.9 35.8*  PLT 253 262   BMET Recent Labs    05/03/20 0141 05/04/20 0301  NA 134* 137  K 4.1 3.8  CL 99 102  CO2 28 28  GLUCOSE 122* 118*  BUN <5* 7*  CREATININE 0.52 0.54  CALCIUM 8.1* 8.4*   PT/INR No results for input(s): LABPROT, INR in the last 72 hours. ABG No results for input(s): PHART, HCO3 in the last 72 hours.  Invalid input(s): PCO2, PO2  Studies/Results: No results found.  Anti-infectives: Anti-infectives (From admission, onward)   Start     Dose/Rate Route Frequency Ordered Stop   04/29/20 2030  cefTRIAXone  (ROCEPHIN) 1 g in sodium chloride 0.9 % 100 mL IVPB        1 g 200 mL/hr over 30 Minutes Intravenous Every 24 hours 04/29/20 2019     04/29/20 2030  azithromycin (ZITHROMAX) 500 mg in sodium chloride 0.9 % 250 mL IVPB        500 mg 250 mL/hr over 60 Minutes Intravenous Every 24 hours 04/29/20 2019 05/03/20 2338   04/29/20 2030  metroNIDAZOLE (FLAGYL) IVPB 500 mg  Status:  Discontinued        500 mg 100 mL/hr over 60 Minutes Intravenous Every 8 hours 04/29/20 2019 05/01/20 1141   04/29/20 1715  metroNIDAZOLE (FLAGYL) IVPB 500 mg        500 mg 100 mL/hr over 60 Minutes Intravenous  Once 04/29/20 1701 04/29/20 1904   04/29/20 1545  cefTRIAXone (ROCEPHIN) 1 g in sodium chloride 0.9 % 100 mL IVPB        1 g 200 mL/hr over 30 Minutes Intravenous  Once 04/29/20 1541 04/29/20 1626   04/29/20 1545  azithromycin (ZITHROMAX) 500 mg in sodium chloride 0.9 % 250 mL IVPB        500 mg 250 mL/hr over 60 Minutes Intravenous  Once 04/29/20 1541 04/29/20 1757      Assessment/Plan: HTN Hypothyroidism HLD Hypokalemia and hypomagnesemia - replace with goal of K >4.0 and Mg >2.0  Bilateral PE with hypoxia- on supplemental O2,heparin gtt per primary team LUL mass on CT- PNA vs neoplasm, repeat  CT in 3-4 weeks recommended  Intussusception S/p open extended R hemicolectomy with stapled anastomosis 05/01/20 Dr. Ninfa Linden  - POD#2 - soft diet - mobilize with PT/OT today - maximize non-narcotic pain control - surgical path pending  FEN: CLD, IVF  VTE: SCDs, heparin gtt  ID: rocephin/flagyl/azithromycin 9/21>> - antibiotics per primary team.  No surgical need for antibiotics  LOS: 5 days    Maia Petties 05/04/2020

## 2020-05-04 NOTE — Progress Notes (Addendum)
PROGRESS NOTE  Ariel Medina IRC:[id number] DOB: [birth year] DOA: 04/29/2020 PCP: Colon Branch, MD   LOS: 5 days   Brief narrative: As per HPI,  Ariel Medina is a 86 y.o. female with medical history significant for history of breast cancer, diverticulitis, hyperlipidemia, hypertension, hypothyroidism, osteoarthritis, osteopenia, CKD, who presented to the emergency department with complaints of worsening diarrhea for 2 weeks with some bright red stool.   Patient also had decreased appetite .  She was seen a few days ago in her PCP office for evaluation and had negative stool cultures and labs at that time.  Patient was revisited by PCP again for heme positive stools and was also noted to be hypoxic. In the emergency room, patient was noted to have bilateral PEs on CT angiography of her chest.  She also had a left upper lobe opacity which could represent a focal inflammation versus a underlying neoplasm.  She was started on empiric antibiotics for possible pneumonia.  She was also noted to have hematochezia and therefore was not placed on anticoagulation.  CT of her abdomen revealed intussusception of her terminal ileum and right-sided colon.  General surgery and GI were consulted.  Assessment/Plan:  Principal Problem:   Pulmonary embolism, bilateral (HCC) Active Problems:   Hypokalemia   Essential hypertension   Intussusception (HCC)   GI bleed   Abnormal CT scan of lung   Hypoxia   Acute pulmonary embolism (HCC)   Goals of care, counseling/discussion   Palliative care by specialist   DNR (do not resuscitate) discussion  Intussusception with mild rectal bleeding. CT scan of the abdomen with intusseption.  Status post open extended right hemicolectomy on 05/01/2020.  On full liquids. Diet advancement as per general surgery.  No definite mass was reported in the operative findings in the colon.  Pathology has been sent and is pending.  Ambulate the patient.  Physical therapy on board.  Encourage ambulation.   Acute hypoxic respiratory failure secondary to bilateral pulmonary embolism,  no right heart strain.  Possible mass versus pneumonia of the left upper lobe.  CT scan of the chest showed 16 x 11 mm irregular solid density in the left upper lobe.  Empirically on IV antibiotic will plan to complete a 5-day course.  Ultrasound of the lower extremity shows acute DVT involving the right popliteal vein right posterior tibial vein and right peroneal vein and left peroneal veins.  Has been changed to eliquis after discussion with surgery. Encouraged incentive spirometry.  Continue ambulation  Bilateral lower extremity DVT. Has been changed to eliquis.   Hypokalemia. Improved with replacement.  Potassium of 3.8 today.  Mild hypomagnesemia.  Improved with replacemeent..  Hypophosphatemia.  Replenished.    Essential hypertension On hydrochlorothiazide at home.  Due to electrolyte issues will hold off with that.  Closely monitor.  If needed consider as needed hydralazine.     Abnormal CT scan of lung Patient has a left upper lobe density which could be a focal inflammation versus neoplasm.  On empiric antibiotic for pneumonia, plan to complete 5-day course. Patient will need repeat CT scan in 3 to 4 weeks to ensure resolution of opacity.  DVT prophylaxis: SCD's Start: 05/01/20 1142 Place TED hose Start: 05/01/20 1142 Heparin drip  Code Status: Full code.     Family Communication:  I spoke with the patient's granddaughter on the phone and updated her about the clinical condition of the patient.   Status is: Inpatient  Remains inpatient appropriate because:IV treatments  appropriate due to intensity of illness or inability to take PO, Inpatient level of care appropriate due to severity of illness , intussusception status post surgical intervention, pulmonary embolism  Dispo: The patient is from: Home              Anticipated d/c is to: SNF as per PT evaluation               Anticipated d/c date is: 2 to 3 days              Patient currently is not medically stable to d/c.  Consultants:  General surgery  Cardiology  Palliative care  PCCM   Procedures:  Open extended right hemicolectomy on 05/01/2020 by Dr. Ninfa Linden  Antibiotics:  . Rocephin, IV, azithromycin 9/21>9/26  Subjective: Today, patient was seen and examined at bedside.  Patient denies any nausea, vomiting or abdominal pain.  Has had a bowel movement.  Tolerating oral intake.  Complains of mild shortness of breath no chest pain.   Objective: Vitals:   05/04/20 0039 05/04/20 0838  BP: 126/73 (!) 146/80  Pulse:  87  Resp: 16 16  Temp:  98.6 F (37 C)  SpO2: 97% 98%    Intake/Output Summary (Last 24 hours) at 05/04/2020 0839 Last data filed at 05/04/2020 0839 Gross per 24 hour  Intake --  Output 800 ml  Net -800 ml   Filed Weights   04/29/20 1145 04/29/20 2240 05/02/20 0437  Weight: 55.1 kg 54.7 kg 56.9 kg   Body mass index is 22.94 kg/m.   Physical Exam: General: Thinly built, not in obvious distress, alert awake and oriented.  On nasal cannula oxygen HENT:   No scleral pallor or icterus noted. Oral mucosa is moist.  Chest:    Diminished breath sounds bilaterally. No crackles or wheezes.  CVS: S1 &S2 heard. No murmur.  Regular rate and rhythm. Abdomen: Soft, status post surgical intervention, appropriately tender on palpation, bowel sounds. Extremities: No cyanosis, clubbing or edema.  Peripheral pulses are palpable. Psych: Alert, awake and oriented, normal mood CNS:  No cranial nerve deficits.  Power equal in all extremities.   Skin: Warm and dry.  No rashes noted.  Data Review: I have personally reviewed the following laboratory data and studies,  CBC: Recent Labs  Lab 04/29/20 1204 04/29/20 1204 04/30/20 0046 05/01/20 0315 05/02/20 0250 05/03/20 0141 05/04/20 0301  WBC 9.4   < > 7.4 9.2 10.5 9.6 8.5  NEUTROABS 7.3  --   --   --   --   --   --   HGB 15.7*    < > 12.7 13.7 13.0 12.3 11.6*  HCT 47.2*   < > 38.5 41.6 40.0 37.9 35.8*  MCV 93.1   < > 93.0 93.9 94.3 94.3 95.5  PLT 289   < > 234 275 275 253 262   < > = values in this interval not displayed.   Basic Metabolic Panel: Recent Labs  Lab 04/30/20 0046 05/01/20 0315 05/02/20 0250 05/03/20 0141 05/04/20 0301  NA 137 138 136 134* 137  K 3.4* 3.9 3.5 4.1 3.8  CL 100 102 99 99 102  CO2 27 27 29 28 28   GLUCOSE 93 128* 118* 122* 118*  BUN <5* <5* <5* <5* 7*  CREATININE 0.50 0.49 0.59 0.52 0.54  CALCIUM 8.0* 8.3* 8.0* 8.1* 8.4*  MG 1.6* 2.2 1.6* 2.0 1.9  PHOS  --   --  2.4* 4.1  --  Liver Function Tests: Recent Labs  Lab 04/29/20 1204 05/02/20 0250 05/03/20 0141  AST 19 15 15   ALT 8 8 7   ALKPHOS 46 34* 33*  BILITOT 0.9 0.5 0.5  PROT 6.0* 4.3* 4.2*  ALBUMIN 3.3* 2.1* 2.0*   Recent Labs  Lab 04/29/20 1204  LIPASE 24   No results for input(s): AMMONIA in the last 168 hours. Cardiac Enzymes: No results for input(s): CKTOTAL, CKMB, CKMBINDEX, TROPONINI in the last 168 hours. BNP (last 3 results) No results for input(s): BNP in the last 8760 hours.  ProBNP (last 3 results) No results for input(s): PROBNP in the last 8760 hours.  CBG: No results for input(s): GLUCAP in the last 168 hours. Recent Results (from the past 240 hour(s))  SARS Coronavirus 2 by RT PCR (hospital order, performed in [facility] hospital lab) Nasopharyngeal Nasopharyngeal Swab     Status: None   Collection Time: 04/29/20  3:01 PM   Specimen: Nasopharyngeal Swab  Result Value Ref Range Status   SARS Coronavirus 2 NEGATIVE NEGATIVE Final    Comment: (NOTE) SARS-CoV-2 target nucleic acids are NOT DETECTED.  The SARS-CoV-2 RNA is generally detectable in upper and lower respiratory specimens during the acute phase of infection. The lowest concentration of SARS-CoV-2 viral copies this assay can detect is 250 copies / mL. A negative result does not preclude SARS-CoV-2 infection and should not be  used as the sole basis for treatment or other patient management decisions.  A negative result may occur with improper specimen collection / handling, submission of specimen other than nasopharyngeal swab, presence of viral mutation(s) within the areas targeted by this assay, and inadequate number of viral copies (<250 copies / mL). A negative result must be combined with clinical observations, patient history, and epidemiological information.  Fact Sheet for Patients:   StrictlyIdeas.no  Fact Sheet for Healthcare Providers: BankingDealers.co.za  This test is not yet approved or  cleared by the [place] FDA and has been authorized for detection and/or diagnosis of SARS-CoV-2 by FDA under an Emergency Use Authorization (EUA).  This EUA will remain in effect (meaning this test can be used) for the duration of the COVID-19 declaration under Section 564(b)(1) of the Act, 21 U.S.C. section 360bbb-3(b)(1), unless the authorization is terminated or revoked sooner.  Performed at [facility], [address] [place], [place] [address]   Surgical pcr screen     Status: None   Collection Time: 05/01/20  3:22 AM   Specimen: Nasal Mucosa; Nasal Swab  Result Value Ref Range Status   MRSA, PCR NEGATIVE NEGATIVE Final   Staphylococcus aureus NEGATIVE NEGATIVE Final    Comment: (NOTE) The Xpert SA Assay (FDA approved for NASAL specimens in patients 24 years of age and older), is one component of a comprehensive surveillance program. It is not intended to diagnose infection nor to guide or monitor treatment. Performed at [facility], [place] [address], [place], [place] [address]      Studies: No results found.    Flora Lipps, MD  [facility] 05/04/2020

## 2020-05-05 LAB — CBC
HCT: 34.7 % — ABNORMAL LOW (ref 36.0–46.0)
Hemoglobin: 10.9 g/dL — ABNORMAL LOW (ref 12.0–15.0)
MCH: 30.4 pg (ref 26.0–34.0)
MCHC: 31.4 g/dL (ref 30.0–36.0)
MCV: 96.7 fL (ref 80.0–100.0)
Platelets: 288 10*3/uL (ref 150–400)
RBC: 3.59 MIL/uL — ABNORMAL LOW (ref 3.87–5.11)
RDW: 13.2 % (ref 11.5–15.5)
WBC: 8 10*3/uL (ref 4.0–10.5)
nRBC: 0 % (ref 0.0–0.2)

## 2020-05-05 LAB — PHOSPHORUS: Phosphorus: 2.7 mg/dL (ref 2.5–4.6)

## 2020-05-05 LAB — COMPREHENSIVE METABOLIC PANEL
ALT: 8 U/L (ref 0–44)
AST: 14 U/L — ABNORMAL LOW (ref 15–41)
Albumin: 1.8 g/dL — ABNORMAL LOW (ref 3.5–5.0)
Alkaline Phosphatase: 27 U/L — ABNORMAL LOW (ref 38–126)
Anion gap: 6 (ref 5–15)
BUN: <5 mg/dL — ABNORMAL LOW (ref 8–23)
CO2: 30 mmol/L (ref 22–32)
Calcium: 8.5 mg/dL — ABNORMAL LOW (ref 8.9–10.3)
Chloride: 103 mmol/L (ref 98–111)
Creatinine, Ser: 0.5 mg/dL (ref 0.44–1.00)
GFR calc Af Amer: >60 mL/min (ref >60)
GFR calc non Af Amer: >60 mL/min (ref >60)
Glucose, Bld: 101 mg/dL — ABNORMAL HIGH (ref 70–99)
Potassium: 4.1 mmol/L (ref 3.5–5.1)
Sodium: 139 mmol/L (ref 135–145)
Total Bilirubin: 0.5 mg/dL (ref 0.3–1.2)
Total Protein: 3.9 g/dL — ABNORMAL LOW (ref 6.5–8.1)

## 2020-05-05 LAB — MAGNESIUM: Magnesium: 1.7 mg/dL (ref 1.7–2.4)

## 2020-05-05 MED ORDER — PSYLLIUM 95 % PO PACK
1.0000 | PACK | Freq: Every day | ORAL | Status: DC
  Administered 2020-05-05 – 2020-05-06 (×2): 1 via ORAL
  Filled 2020-05-05 (×3): qty 1

## 2020-05-05 NOTE — Progress Notes (Addendum)
PROGRESS NOTE  Ariel Medina YQM:[id number] DOB: 1927-12-02 DOA: 04/29/2020 PCP: Colon Branch, MD   LOS: 6 days   Brief narrative: As per HPI,  Ariel Medina is a 86 y.o. female with medical history significant for history of breast cancer, diverticulitis, hyperlipidemia, hypertension, hypothyroidism, osteoarthritis, osteopenia, CKD, who presented to the emergency department with complaints of worsening diarrhea for 2 weeks with some bright red stool.   Patient also had decreased appetite .  She was seen a few days ago in her PCP office for evaluation and had negative stool cultures and labs at that time.  Patient was revisited by PCP again for heme positive stools and was also noted to be hypoxic. In the emergency room, patient was noted to have bilateral PEs on CT angiography of her chest.  She also had a left upper lobe opacity which could represent a focal inflammation versus a underlying neoplasm.  She was started on empiric antibiotics for possible pneumonia.  She was also noted to have hematochezia and therefore was not placed on anticoagulation.  CT of her abdomen revealed intussusception of her terminal ileum and right-sided colon.  General surgery and GI were consulted.  Assessment/Plan:  Principal Problem:   Pulmonary embolism, bilateral (HCC) Active Problems:   Hypokalemia   Essential hypertension   Intussusception (HCC)   GI bleed   Abnormal CT scan of lung   Hypoxia   Acute pulmonary embolism (HCC)   Goals of care, counseling/discussion   Palliative care by specialist   DNR (do not resuscitate) discussion  Intussusception with mild rectal bleeding. CT scan of the abdomen with intusseption.  Status post open extended right hemicolectomy on 05/01/2020.  On soft diet.  No definite mass was reported in the operative findings in the colon.  Pathology has been sent and is pending.     Acute hypoxic respiratory failure secondary to bilateral pulmonary embolism,  no right heart  strain.  CT scan of the chest showed 16 x 11 mm irregular solid density in the left upper lobe.  Completed empiric antibiotic for possible pneumonia for 5 days.  Ultrasound of the lower extremity shows acute DVT involving the right popliteal vein right posterior tibial vein and right peroneal vein and left peroneal veins. On  eliquis for anticoagulation, tolerating well  Bilateral lower extremity DVT.  On Eliquis anticoagulation   Hypokalemia.  Improved.  Potassium 4.1 today.  Mild hypomagnesemia.  Improved, magnesium 1.7 today.  Hypophosphatemia.  Replenished.  Phosphorus of 2.7 today.    Essential hypertension On hydrochlorothiazide at home.  Due to electrolyte issues will hold off with that.  Closely monitor.  If needed consider as needed hydralazine.     Abnormal CT scan of lung 16 x 11 mm irregular solid density in the left upper lobe. Patient will need repeat CT scan in 3 to 4 weeks to ensure resolution of opacity.  DVT prophylaxis: SCD's Start: 05/01/20 1142 Place TED hose Start: 05/01/20 1142 Eliquis  Code Status: Full code.     Family Communication: None today  Status is: Inpatient  Remains inpatient appropriate because:IV treatments appropriate due to intensity of illness or inability to take PO, Inpatient level of care appropriate due to severity of illness , intussusception status post surgical intervention, pulmonary embolism, needs skilled nursing facility placement  Dispo: The patient is from: Home              Anticipated d/c is to: SNF as per PT evaluation  Anticipated d/c date is: Likely tomorrow.               Patient currently is medically stable to d/c.  Consultants:  General surgery  Cardiology  Palliative care  PCCM   Procedures:  Open extended right hemicolectomy on 05/01/2020 by Dr. Ninfa Linden  Antibiotics:  . Rocephin, IV, azithromycin 9/21>9/26  Subjective: Today, patient  was seen and examined at bedside.  Patient feels  better.  Denies any nausea vomiting or abdominal pain.  Has been tolerating p.o. diet.   Objective: Vitals:   05/05/20 0439 05/05/20 0810  BP: 130/77 (!) 143/80  Pulse: 87 88  Resp: 18 20  Temp: 98.1 F (36.7 C) 98.9 F (37.2 C)  SpO2: 97% 97%    Intake/Output Summary (Last 24 hours) at 05/05/2020 0826 Last data filed at 05/04/2020 0840 Gross per 24 hour  Intake 130 ml  Output 800 ml  Net -670 ml   Filed Weights   04/29/20 1145 04/29/20 2240 05/02/20 0437  Weight: 55.1 kg 54.7 kg 56.9 kg   Body mass index is 22.94 kg/m.   Physical Exam:  General: Thinly built, not in obvious distress, alert awake and oriented.  On nasal cannula oxygen HENT:   No scleral pallor or icterus noted. Oral mucosa is moist.  Chest:    Diminished breath sounds bilaterally. No crackles or wheezes.  CVS: S1 &S2 heard. No murmur.  Regular rate and rhythm. Abdomen: Soft, status post surgical intervention, appropriately tender on palpation, positive bowel sounds. Extremities: No cyanosis, clubbing or edema.  Peripheral pulses are palpable. Psych: Alert, awake and oriented, normal mood CNS:  No cranial nerve deficits.  Power equal in all extremities.   Skin: Warm and dry.  No rashes noted.  Data Review: I have personally reviewed the following laboratory data and studies,  CBC: Recent Labs  Lab 04/29/20 1204 04/30/20 0046 05/01/20 0315 05/02/20 0250 05/03/20 0141 05/04/20 0301 05/05/20 0327  WBC 9.4   < > 9.2 10.5 9.6 8.5 8.0  NEUTROABS 7.3  --   --   --   --   --   --   HGB 15.7*   < > 13.7 13.0 12.3 11.6* 10.9*  HCT 47.2*   < > 41.6 40.0 37.9 35.8* 34.7*  MCV 93.1   < > 93.9 94.3 94.3 95.5 96.7  PLT 289   < > 275 275 253 262 288   < > = values in this interval not displayed.   Basic Metabolic Panel: Recent Labs  Lab 05/01/20 0315 05/02/20 0250 05/03/20 0141 05/04/20 0301 05/05/20 0327  NA 138 136 134* 137 139  K 3.9 3.5 4.1 3.8 4.1  CL 102 99 99 102 103  CO2 27 29 28 28 30    GLUCOSE 128* 118* 122* 118* 101*  BUN <5* <5* <5* 7* <5*  CREATININE 0.49 0.59 0.52 0.54 0.50  CALCIUM 8.3* 8.0* 8.1* 8.4* 8.5*  MG 2.2 1.6* 2.0 1.9 1.7  PHOS  --  2.4* 4.1  --  2.7   Liver Function Tests: Recent Labs  Lab 04/29/20 1204 05/02/20 0250 05/03/20 0141 05/05/20 0327  AST 19 15 15  14*  ALT 8 8 7 8   ALKPHOS 46 34* 33* 27*  BILITOT 0.9 0.5 0.5 0.5  PROT 6.0* 4.3* 4.2* 3.9*  ALBUMIN 3.3* 2.1* 2.0* 1.8*   Recent Labs  Lab 04/29/20 1204  LIPASE 24   No results for input(s): AMMONIA in the last 168 hours. Cardiac Enzymes: No results for  input(s): CKTOTAL, CKMB, CKMBINDEX, TROPONINI in the last 168 hours. BNP (last 3 results) No results for input(s): BNP in the last 8760 hours.  ProBNP (last 3 results) No results for input(s): PROBNP in the last 8760 hours.  CBG: Recent Labs  Lab 05/04/20 1211  GLUCAP 155*   Recent Results (from the past 240 hour(s))  SARS Coronavirus 2 by RT PCR (hospital order, performed in [facility] hospital lab) Nasopharyngeal Nasopharyngeal Swab     Status: None   Collection Time: 04/29/20  3:01 PM   Specimen: Nasopharyngeal Swab  Result Value Ref Range Status   SARS Coronavirus 2 NEGATIVE NEGATIVE Final    Comment: (NOTE) SARS-CoV-2 target nucleic acids are NOT DETECTED.  The SARS-CoV-2 RNA is generally detectable in upper and lower respiratory specimens during the acute phase of infection. The lowest concentration of SARS-CoV-2 viral copies this assay can detect is 250 copies / mL. A negative result does not preclude SARS-CoV-2 infection and should not be used as the sole basis for treatment or other patient management decisions.  A negative result may occur with improper specimen collection / handling, submission of specimen other than nasopharyngeal swab, presence of viral mutation(s) within the areas targeted by this assay, and inadequate number of viral copies (<250 copies / mL). A negative result must be combined with  clinical observations, patient history, and epidemiological information.  Fact Sheet for Patients:   StrictlyIdeas.no  Fact Sheet for Healthcare Providers: BankingDealers.co.za  This test is not yet approved or  cleared by the [place] FDA and has been authorized for detection and/or diagnosis of SARS-CoV-2 by FDA under an Emergency Use Authorization (EUA).  This EUA will remain in effect (meaning this test can be used) for the duration of the COVID-19 declaration under Section 564(b)(1) of the Act, 21 U.S.C. section 360bbb-3(b)(1), unless the authorization is terminated or revoked sooner.  Performed at [facility], [address], [place], [place] [address]   Surgical pcr screen     Status: None   Collection Time: 05/01/20  3:22 AM   Specimen: Nasal Mucosa; Nasal Swab  Result Value Ref Range Status   MRSA, PCR NEGATIVE NEGATIVE Final   Staphylococcus aureus NEGATIVE NEGATIVE Final    Comment: (NOTE) The Xpert SA Assay (FDA approved for NASAL specimens in patients 69 years of age and older), is one component of a comprehensive surveillance program. It is not intended to diagnose infection nor to guide or monitor treatment. Performed at [facility], [place] [address] [place], [place] [address]      Studies: No results found.    Flora Lipps, MD  [facility] 05/05/2020

## 2020-05-05 NOTE — Progress Notes (Signed)
[facility] Progress Note  4 Days Post-Op  Subjective: Patient reports abdomen is sore but pain well controlled. Having loose stools but tolerating diet. Hoping to go to [facility] for rehab.   Objective: Vital signs in last 24 hours: Temp:  [98.1 F (36.7 C)-98.9 F (37.2 C)] 98.9 F (37.2 C) (09/27 0810) Pulse Rate:  [87-99] 88 (09/27 0810) Resp:  [18-21] 20 (09/27 0810) BP: (124-148)/(75-88) 143/80 (09/27 0810) SpO2:  [96 %-99 %] 97 % (09/27 0810) Last BM Date: 05/04/20  Intake/Output from previous day: 09/26 0701 - 09/27 0700 In: 130 [P.O.:130] Out: 800 [Urine:800] Intake/Output this shift: No intake/output data recorded.  PE: General: pleasant, WD, elderly female who is laying in bed in NAD Heart: regular, rate, and rhythm.  Normal s1,s2. No obvious murmurs, gallops, or rubs noted.  Palpable radial and pedal pulses bilaterally Lungs: CTAB, no wheezes, rhonchi, or rales noted.  Respiratory effort nonlabored Abd: soft, NT, ND, +BS, incision c/d/i with staples present MS: all 4 extremities are symmetrical with no cyanosis, clubbing, or edema. Skin: warm and dry with no masses, lesions, or rashes Psych: A&Ox3 with an appropriate affect.   Lab Results:  Recent Labs    05/04/20 0301 05/05/20 0327  WBC 8.5 8.0  HGB 11.6* 10.9*  HCT 35.8* 34.7*  PLT 262 288   BMET Recent Labs    05/04/20 0301 05/05/20 0327  NA 137 139  K 3.8 4.1  CL 102 103  CO2 28 30  GLUCOSE 118* 101*  BUN 7* <5*  CREATININE 0.54 0.50  CALCIUM 8.4* 8.5*   PT/INR No results for input(s): LABPROT, INR in the last 72 hours. CMP     Component Value Date/Time   NA 139 05/05/2020 0327   NA 140 12/06/2016 0000   K 4.1 05/05/2020 0327   CL 103 05/05/2020 0327   CO2 30 05/05/2020 0327   GLUCOSE 101 (H) 05/05/2020 0327   BUN <5 (L) 05/05/2020 0327   BUN 14 12/06/2016 0000   CREATININE 0.50 05/05/2020 0327   CREATININE 0.64 04/25/2020 1620   CALCIUM 8.5 (L) 05/05/2020 0327    PROT 3.9 (L) 05/05/2020 0327   ALBUMIN 1.8 (L) 05/05/2020 0327   AST 14 (L) 05/05/2020 0327   ALT 8 05/05/2020 0327   ALKPHOS 27 (L) 05/05/2020 0327   BILITOT 0.5 05/05/2020 0327   GFRNONAA >60 05/05/2020 0327   GFRAA >60 05/05/2020 0327   Lipase     Component Value Date/Time   LIPASE 24 04/29/2020 1204       Studies/Results: No results found.  Anti-infectives: Anti-infectives (From admission, onward)   Start     Dose/Rate Route Frequency Ordered Stop   04/29/20 2030  cefTRIAXone (ROCEPHIN) 1 g in sodium chloride 0.9 % 100 mL IVPB  Status:  Discontinued        1 g 200 mL/hr over 30 Minutes Intravenous Every 24 hours 04/29/20 2019 05/04/20 1104   04/29/20 2030  azithromycin (ZITHROMAX) 500 mg in sodium chloride 0.9 % 250 mL IVPB        500 mg 250 mL/hr over 60 Minutes Intravenous Every 24 hours 04/29/20 2019 05/03/20 2338   04/29/20 2030  metroNIDAZOLE (FLAGYL) IVPB 500 mg  Status:  Discontinued        500 mg 100 mL/hr over 60 Minutes Intravenous Every 8 hours 04/29/20 2019 05/01/20 1141   04/29/20 1715  metroNIDAZOLE (FLAGYL) IVPB 500 mg        500 mg 100 mL/hr over  60 Minutes Intravenous  Once 04/29/20 1701 04/29/20 1904   04/29/20 1545  cefTRIAXone (ROCEPHIN) 1 g in sodium chloride 0.9 % 100 mL IVPB        1 g 200 mL/hr over 30 Minutes Intravenous  Once 04/29/20 1541 04/29/20 1626   04/29/20 1545  azithromycin (ZITHROMAX) 500 mg in sodium chloride 0.9 % 250 mL IVPB        500 mg 250 mL/hr over 60 Minutes Intravenous  Once 04/29/20 1541 04/29/20 1757       Assessment/Plan HTN Hypothyroidism HLD Hypokalemia and hypomagnesemia - replace with goal of K >4.0 and Mg >2.0  Bilateral PE with hypoxia- on supplemental O2 LUL mass on CT- PNA vs neoplasm, repeat CT in 3-4 weeks recommended  Intussusception S/p open extended R hemicolectomy with stapled anastomosis 05/01/20 Dr. Ninfa Linden  - POD#4 - continue soft diet, add fiber to help with loose stools - remove  surgical dressing tomorrow and apply dry dressing to incision as needed - continue PT/OT - recommending SNF - surgical path pending  - patient stable for discharge from a surgical perspective, will work on arranging visit for staple removal and surgical follow up   FEN: soft diet   VTE: eliquis ID: off all abx currently   LOS: 6 days    Norm Parcel , [facility] 05/05/2020, 9:31 AM Please see Amion for pager number during day hours 7:00am-4:30pm

## 2020-05-05 NOTE — TOC Progression Note (Signed)
Transition of Care [facility]) - Progression Note    Patient Details  Name: Ariel Medina MRN: [id number] Date of Birth: [birth year]  Transition of Care [facility]) CM/SW [place], [place] Phone Number: 05/05/2020, 11:59 AM  Clinical Narrative:     [facility] has confirmed bed offer and can accept patient tomorrow, if medically stable.  MD updated & covid test requested.  Thurmond Butts, MSW, [place] Clinical Social Worker   Expected Discharge Plan: Skilled Nursing Facility Barriers to Discharge: SNF Pending bed offer  Expected Discharge Plan and Services Expected Discharge Plan: [place] In-house Referral: Clinical Social Work     Living arrangements for the past 2 months: Single Family Home                                       Social Determinants of Health (SDOH) Interventions    Readmission Risk Interventions No flowsheet data found.

## 2020-05-06 LAB — CBC
HCT: 34.1 % — ABNORMAL LOW (ref 36.0–46.0)
Hemoglobin: 10.7 g/dL — ABNORMAL LOW (ref 12.0–15.0)
MCH: 30.4 pg (ref 26.0–34.0)
MCHC: 31.4 g/dL (ref 30.0–36.0)
MCV: 96.9 fL (ref 80.0–100.0)
Platelets: 304 10*3/uL (ref 150–400)
RBC: 3.52 MIL/uL — ABNORMAL LOW (ref 3.87–5.11)
RDW: 13.3 % (ref 11.5–15.5)
WBC: 10.1 10*3/uL (ref 4.0–10.5)
nRBC: 0 % (ref 0.0–0.2)

## 2020-05-06 LAB — SARS CORONAVIRUS 2 (TAT 6-24 HRS): SARS Coronavirus 2: NEGATIVE

## 2020-05-06 MED ORDER — ROSUVASTATIN CALCIUM 5 MG PO TABS
5.0000 mg | ORAL_TABLET | ORAL | Status: DC
Start: 2020-05-07 — End: 2020-05-26

## 2020-05-06 MED ORDER — ACETAMINOPHEN 325 MG PO TABS: 650.0000 mg | ORAL_TABLET | Freq: Four times a day (QID) | ORAL | Status: DC | PRN

## 2020-05-06 MED ORDER — APIXABAN 5 MG PO TABS: ORAL_TABLET | ORAL | Status: DC

## 2020-05-06 MED ORDER — OXYCODONE HCL 5 MG PO TABS
5.0000 mg | ORAL_TABLET | Freq: Four times a day (QID) | ORAL | 0 refills | Status: DC | PRN
Start: 2020-05-06 — End: 2020-05-07

## 2020-05-06 MED ORDER — OXYCODONE HCL 5 MG PO TABS
5.0000 mg | ORAL_TABLET | Freq: Four times a day (QID) | ORAL | 0 refills | Status: DC | PRN
Start: 2020-05-06 — End: 2020-05-06

## 2020-05-06 MED ORDER — PSYLLIUM 95 % PO PACK: 1.0000 | PACK | Freq: Every day | ORAL | Status: DC

## 2020-05-06 MED ORDER — GABAPENTIN 300 MG PO CAPS: 300.0000 mg | ORAL_CAPSULE | Freq: Two times a day (BID) | ORAL | 0 refills | Status: DC

## 2020-05-06 NOTE — Progress Notes (Signed)
Physical Therapy Treatment Patient Details Name: Ariel Medina MRN: [id number] DOB: 05-Mar-1928 Today's Date: 05/06/2020    History of Present Illness Pt is a 86 y.o. female admitted 04/29/20 with diarrhea and bloody stool. Chest imaging with bilateral PEs; LUL density suspect for inflammation vs neoplasm. Abdominal CT with intussusception of her terminal ileum and R-sided colon. S/p extended right hemicolectomy on 9/23. Ultrasound with RLE DVTs. PMH includes OA, HTN, HLD, breast CA, syncope.    PT Comments    Patient progressing well towards PT goals. Improved gait training distance with Min guard-Min A for balance/safety. Pt reports weakness in LEs and noted to fatigue requiring seated rest break during ambulation. Sp02 dropped to 85% on RA with activity; cues provided for pursed lip breathing and able to recover with rest and time. Initially mod A to stand from EOB progressing to min guard-Min A to stand from chair. Eager to get to rehab to regain strength/endurance. Will follow.    Follow Up Recommendations  SNF;Supervision for mobility/OOB     Equipment Recommendations  None recommended by PT    Recommendations for Other Services       Precautions / Restrictions Precautions Precautions: Fall;Other (comment) Precaution Comments: Bowel/bladder urgency/incontinence (has Depends in room); abdominal precautions for comfort, watch 02 Restrictions Weight Bearing Restrictions: No    Mobility  Bed Mobility Overal bed mobility: Needs Assistance Bed Mobility: Rolling;Sidelying to Sit Rolling: Min assist Sidelying to sit: Min assist;HOB elevated       General bed mobility comments: Able to roll to right/left x5 for pericare; assist with trunk to get to EOB.  Transfers Overall transfer level: Needs assistance Equipment used: Rolling walker (2 wheeled) Transfers: Sit to/from Stand Sit to Stand: Min assist;Mod assist         General transfer comment: initial mod A with sit to  stand from EOB progressing to min A from chair with cues for hand placement. Transferred to chair post ambulation.  Ambulation/Gait Ambulation/Gait assistance: Min guard Gait Distance (Feet): 15 Feet (x2 bouts) Assistive device: Rolling walker (2 wheeled) Gait Pattern/deviations: Step-to pattern;Shuffle;Decreased step length - right;Decreased step length - left;Step-through pattern;Narrow base of support;Trunk flexed Gait velocity: Decreased   General Gait Details: Slow, mildly unsteady gait wtih short shuffling like steps with RW; fatigues. 2/4 DOE. Sp02 dropped to 85% on RA. Cues for pursed lip breathing. 1 seated rest break.   Stairs             Wheelchair Mobility    Modified Rankin (Stroke Patients Only)       Balance Overall balance assessment: Needs assistance Sitting-balance support: Feet supported;No upper extremity supported Sitting balance-Leahy Scale: Fair     Standing balance support: During functional activity Standing balance-Leahy Scale: Poor Standing balance comment: Reliant on BUE support; assist needed to donn briefs in standing.                            Cognition Arousal/Alertness: Awake/alert Behavior During Therapy: WFL for tasks assessed/performed Overall Cognitive Status: Within Functional Limits for tasks assessed                                        Exercises      General Comments General comments (skin integrity, edema, etc.): Sp02 ranged from 85-95% on RA to 1L during activity.      Pertinent Vitals/Pain  Pain Assessment: Faces Faces Pain Scale: Hurts little more Pain Location: abdomen Pain Descriptors / Indicators: Sore Pain Intervention(s): Monitored during session;Repositioned    Home Living                      Prior Function            PT Goals (current goals can now be found in the care plan section) Progress towards PT goals: Progressing toward goals    Frequency    Min  2X/week      PT Plan Current plan remains appropriate    Co-evaluation              AM-PAC PT "6 Clicks" Mobility   Outcome Measure  Help needed turning from your back to your side while in a flat bed without using bedrails?: A Little Help needed moving from lying on your back to sitting on the side of a flat bed without using bedrails?: A Little Help needed moving to and from a bed to a chair (including a wheelchair)?: A Little Help needed standing up from a chair using your arms (e.g., wheelchair or bedside chair)?: A Little Help needed to walk in hospital room?: A Little Help needed climbing 3-5 steps with a railing? : A Lot 6 Click Score: 17    End of Session Equipment Utilized During Treatment: Gait belt Activity Tolerance: Patient tolerated treatment well;Treatment limited secondary to medical complications (Comment) (drop in Sp02) Patient left: in chair;with call bell/phone within reach;with nursing/sitter in room;with chair alarm set Nurse Communication: Mobility status PT Visit Diagnosis: Other abnormalities of gait and mobility (R26.89);Muscle weakness (generalized) (M62.81);Pain Pain - part of body:  (abdomen)     Time: 9485-4627 PT Time Calculation (min) (ACUTE ONLY): 26 min  Charges:  $Gait Training: 8-22 mins $Therapeutic Activity: 8-22 mins                     ,Marisa Severin, PT, DPT Acute Rehabilitation Services Pager [phone] Office [phone]   \   Marguarite Arbour A Sabra Heck 05/06/2020, 10:41 AM

## 2020-05-06 NOTE — TOC Transition Note (Signed)
Transition of Care [facility]) - CM/SW Discharge Note   Patient Details  Name: PANZY BUBECK MRN: [id number] Date of Birth: [birth year]  [facility]) CM/SW Contact:  Vinie Sill, [place] Phone Number: 05/06/2020, 12:19 PM   Clinical Narrative:     Patient will DC to: [place] Date: 05/06/2020 Family Notified: Occupational hygienist Transport By: Corey Harold @ 2p   Per MD patient is ready for discharge. RN, patient, and facility notified of DC. Discharge Summary sent to facility. RN given number for [phone]. Ambulance transport requested for patient.   Clinical Social Worker signing off.  Thurmond Butts, MSW, [place] Clinical Social Worker    Final next level of care: Skilled Nursing Facility Barriers to Discharge: Barriers Resolved   Patient Goals and CMS Choice        Discharge Placement PASRR number recieved: 04/30/20            Patient chooses bed at: [facility] Patient to be transferred to facility by: [place] Name of family member notified: Carolyn,granddaughter Patient and family notified of of transfer: 05/06/20  Discharge Plan and Services In-house Referral: Clinical Social Work                                   Social Determinants of Health (SDOH) Interventions     Readmission Risk Interventions No flowsheet data found.

## 2020-05-06 NOTE — Progress Notes (Signed)
Report called to RN at [facility]. All questions answered. Pt awaiting transportation to facility.   Arletta Bale, RN

## 2020-05-06 NOTE — Discharge Summary (Addendum)
Physician Discharge Summary  Ariel Medina:[id number] DOB: 1927-12-20 DOA: 04/29/2020  PCP: Colon Branch, MD  Admit date: 04/29/2020 Discharge date: 05/07/2020  The original discharge summary was completed by Dr Louanne Belton. She did not discharge on 9/28 as planned. I have evaluated her today and she remains stable for discharge. I have refreshed the d/c summary to reflect today's discharge date. NO other changes were made.   Admitted From: Home  Discharge disposition: SNF   Recommendations for Outpatient Follow-Up:   . Follow up with your primary care provider at the skilled nursing facility in 3 to 5 days . Check CBC, BMP, magnesium in the next visit . Follow-up with general surgery as has been scheduled. . Patient does have pulmonary opacity on the CT scan.  Patient will need repeat CT scan in 3 to 4 weeks to ensure resolution of opacity. . Patient was diagnosed to have a DVT/ pulmonary embolism.  Will need at least 3 to 6 months of anticoagulation.  Reassess after 3 months.   Discharge Diagnosis:   Principal Problem:   Pulmonary embolism, bilateral (HCC) Active Problems:   Hypokalemia   Essential hypertension   Intussusception (HCC)   GI bleed   Abnormal CT scan of lung   Hypoxia   Acute pulmonary embolism (HCC)   Goals of care, counseling/discussion   Palliative care by specialist   DNR (do not resuscitate) discussion   Discharge Condition: Improved.  Diet recommendation: Low sodium, heart healthy.  Soft diet  Wound care: Local abdominal wound care, keep it open  Code status: Full.   History of Present Illness:   Danaja A [name] a 86 y.o.femalewith medical history significant forhistory of breast cancer, diverticulitis, hyperlipidemia, hypertension, hypothyroidism, osteoarthritis, osteopenia, CKD, who presented to the emergency department with complaints of worsening diarrhea for 2 weeks with some bright red stool.  Patient also had decreased appetite.  She was seen a few days ago in her PCP office for evaluation and had negative stool cultures and labs at that time.  Patient was revisited by PCP again for heme positive stools and was also noted to be hypoxic. In the emergency room, patient was noted to have bilateral PEs on CT angiography of her chest. She also had a left upper lobe opacity which could represent a focal inflammation versus a underlying neoplasm. She was started on empiric antibiotics for possible pneumonia. She was also noted to have hematochezia and therefore was not placed on anticoagulation. CT of her abdomen revealed intussusception of her terminal ileum and right-sided colon. General surgery and GI were consulted.   Hospital Course:   Following conditions were addressed during hospitalization as listed below,  Intussusception with mild rectal bleeding. CT scan of the abdomen with intusseption.  Status post open extended right hemicolectomy on 05/01/2020.  On soft diet and has tolerated well..  No definite mass was reported in the operative findings in the colon.  Pathology has been sent and is pending.    General surgery has seen the patient today and okay for discharge   Acute hypoxic respiratory failure secondary to bilateral pulmonary embolism,  no right heart strain.  CT scan of the chest showed 16 x 11 mm irregular solid density in the left upper lobe.  Completed empiric antibiotic for possible pneumonia for 5 days.  Ultrasound of the lower extremity shows acute DVT involving the right popliteal vein right posterior tibial vein and right peroneal vein and left peroneal veins. On  eliquis for anticoagulation,  tolerating well.  Will need to continue anticoagulation for at least 3 months  Bilateral lower extremity DVT.  On Eliquis anticoagulation.  Continue anticoagulation for at least 3 months.  Reassess further need with primary care physician.  Hypokalemia.  Improved.   Mild hypomagnesemia.  Improved, latest  magnesium 1.7   Hypophosphatemia.  Replenished.    Latest phosphorus of 2.7   Essential hypertension On hydrochlorothiazide at home.    Restarted on discharge   Abnormal CT scan of lung 16 x 11 mm irregular solid density in the left upper lobe.Patient will need repeat CT scan in 3 to 4 weeks to ensure resolution of opacity.  Disposition.  At this time, patient is stable for disposition to skilled nursing facility.  Spoke with the patient's granddaughter on the phone and updated her about the plan for disposition.  Medical Consultants:    General surgery  Cardiology  Palliative care  PCCM  Procedures:    Open extended right hemicolectomy on 05/01/2020 by Dr. Ninfa Linden   Subjective:   Today, patient was seen and examined at bedside.  Complains of mild sore in the abdominal wall.  No nausea vomiting has tolerated diet.  Had a bowel movement.  Discharge Exam:   Vitals:   05/07/20 0745 05/07/20 0800  BP: 114/74 (!) 155/122  Pulse: 84 91  Resp: 17 (!) 25  Temp: 98 F (36.7 C)   SpO2: 94% 98%   Vitals:   05/07/20 0400 05/07/20 0630 05/07/20 0745 05/07/20 0800  BP: (!) 141/70  114/74 (!) 155/122  Pulse: 87 86 84 91  Resp: 20 16 17  (!) 25  Temp: 98.4 F (36.9 C)  98 F (36.7 C)   TempSrc: Oral  Oral   SpO2: 95% 93% 94% 98%  Weight:  62.6 kg    Height:        General: Alert awake, not in obvious distress,, on room air  HENT: pupils equally reacting to light,  No scleral pallor or icterus noted. Oral mucosa is moist.  Chest:   Diminished breath sounds bilaterally. No crackles or wheezes.  CVS: S1 &S2 heard. No murmur.  Regular rate and rhythm. Abdomen: Soft,clean dry incision site.  Staples in place nondistended.  Bowel sounds are heard.   Extremities: No cyanosis, clubbing or edema.  Peripheral pulses are palpable. Psych: Alert, awake and oriented, normal mood CNS:  No cranial nerve deficits.  Power equal in all extremities.   Skin: Warm and dry.  No  rashes noted.  The results of significant diagnostics from this hospitalization (including imaging, microbiology, ancillary and laboratory) are listed below for reference.     Diagnostic Studies:   CT Angio Chest PE W and/or Wo Contrast  Result Date: 04/29/2020 CLINICAL DATA:  Lower abdominal pain, bloody stools. EXAM: CT ANGIOGRAPHY CHEST CT ABDOMEN AND PELVIS WITH CONTRAST TECHNIQUE: Multidetector CT imaging of the chest was performed using the standard protocol during bolus administration of intravenous contrast. Multiplanar CT image reconstructions and MIPs were obtained to evaluate the vascular anatomy. Multidetector CT imaging of the abdomen and pelvis was performed using the standard protocol during bolus administration of intravenous contrast. CONTRAST:  136mL OMNIPAQUE IOHEXOL 350 MG/ML SOLN COMPARISON:  None. FINDINGS: CTA CHEST FINDINGS Cardiovascular: Filling defects are noted in peripheral branches of both pulmonary arteries consistent with acute pulmonary emboli. Atherosclerosis of thoracic aorta is noted without aneurysm or dissection. Mild cardiomegaly is noted. No pericardial effusion is noted. Mediastinum/Nodes: Large sliding-type hiatal hernia is noted. Thyroid gland is unremarkable.  No adenopathy is noted. Lungs/Pleura: No pneumothorax or pleural effusion is noted. Irregular density is noted peripherally in the left upper lobe with 16 x 11 mm irregular solid density; this may simply represent focal inflammation, but underlying neoplasm cannot be excluded. Follow-up CT scan in 3 weeks is recommended to ensure resolution or stability. Musculoskeletal: No chest wall abnormality. No acute or significant osseous findings. Review of the MIP images confirms the above findings. CT ABDOMEN and PELVIS FINDINGS Hepatobiliary: No focal liver abnormality is seen. No gallstones, gallbladder wall thickening, or biliary dilatation. Pancreas: Unremarkable. No pancreatic ductal dilatation or surrounding  inflammatory changes. Spleen: Normal in size without focal abnormality. Adrenals/Urinary Tract: Adrenal glands are unremarkable. Bilateral renal cysts are noted. No hydronephrosis or renal obstruction is noted. No renal or ureteral calculi are noted. Urinary bladder is unremarkable. Stomach/Bowel: There is a large intussusception involving the terminal ileum, right and transverse colon. Possible 19 mm lipoma may be at the lead point. Sigmoid diverticulosis is noted without inflammation. Vascular/Lymphatic: Aortic atherosclerosis. No enlarged abdominal or pelvic lymph nodes. Reproductive: Uterus and bilateral adnexa are unremarkable. Other: No abdominal wall hernia or abnormality. No abdominopelvic ascites. Musculoskeletal: No acute or significant osseous findings. Review of the MIP images confirms the above findings. IMPRESSION: 1. Acute bilateral pulmonary emboli are noted. 2. Irregular density is noted peripherally in the left upper lobe with 16 x 11 mm irregular solid density; this may simply represent focal inflammation, but underlying neoplasm cannot be excluded. Follow-up CT scan in 3 weeks is recommended to ensure resolution or stability. 3. Large intussusception is seen involving the terminal ileum, right and transverse colon. Possible 19 mm lipoma may be at the lead point. 4. Large sliding-type hiatal hernia is noted. 5. Sigmoid diverticulosis without inflammation. Critical Value/emergent results were called by telephone at the time of interpretation on 04/29/2020 at 3:17 pm to provider [facility] , who verbally acknowledged these results. Aortic Atherosclerosis (ICD10-I70.0). Electronically Signed   By: Marijo Conception M.D.   On: 04/29/2020 15:17   CT ABDOMEN PELVIS W CONTRAST  Result Date: 04/29/2020 CLINICAL DATA:  Lower abdominal pain, bloody stools. EXAM: CT ANGIOGRAPHY CHEST CT ABDOMEN AND PELVIS WITH CONTRAST TECHNIQUE: Multidetector CT imaging of the chest was performed using the standard  protocol during bolus administration of intravenous contrast. Multiplanar CT image reconstructions and MIPs were obtained to evaluate the vascular anatomy. Multidetector CT imaging of the abdomen and pelvis was performed using the standard protocol during bolus administration of intravenous contrast. CONTRAST:  158mL OMNIPAQUE IOHEXOL 350 MG/ML SOLN COMPARISON:  None. FINDINGS: CTA CHEST FINDINGS Cardiovascular: Filling defects are noted in peripheral branches of both pulmonary arteries consistent with acute pulmonary emboli. Atherosclerosis of thoracic aorta is noted without aneurysm or dissection. Mild cardiomegaly is noted. No pericardial effusion is noted. Mediastinum/Nodes: Large sliding-type hiatal hernia is noted. Thyroid gland is unremarkable. No adenopathy is noted. Lungs/Pleura: No pneumothorax or pleural effusion is noted. Irregular density is noted peripherally in the left upper lobe with 16 x 11 mm irregular solid density; this may simply represent focal inflammation, but underlying neoplasm cannot be excluded. Follow-up CT scan in 3 weeks is recommended to ensure resolution or stability. Musculoskeletal: No chest wall abnormality. No acute or significant osseous findings. Review of the MIP images confirms the above findings. CT ABDOMEN and PELVIS FINDINGS Hepatobiliary: No focal liver abnormality is seen. No gallstones, gallbladder wall thickening, or biliary dilatation. Pancreas: Unremarkable. No pancreatic ductal dilatation or surrounding inflammatory changes. Spleen: Normal in size  without focal abnormality. Adrenals/Urinary Tract: Adrenal glands are unremarkable. Bilateral renal cysts are noted. No hydronephrosis or renal obstruction is noted. No renal or ureteral calculi are noted. Urinary bladder is unremarkable. Stomach/Bowel: There is a large intussusception involving the terminal ileum, right and transverse colon. Possible 19 mm lipoma may be at the lead point. Sigmoid diverticulosis is noted  without inflammation. Vascular/Lymphatic: Aortic atherosclerosis. No enlarged abdominal or pelvic lymph nodes. Reproductive: Uterus and bilateral adnexa are unremarkable. Other: No abdominal wall hernia or abnormality. No abdominopelvic ascites. Musculoskeletal: No acute or significant osseous findings. Review of the MIP images confirms the above findings. IMPRESSION: 1. Acute bilateral pulmonary emboli are noted. 2. Irregular density is noted peripherally in the left upper lobe with 16 x 11 mm irregular solid density; this may simply represent focal inflammation, but underlying neoplasm cannot be excluded. Follow-up CT scan in 3 weeks is recommended to ensure resolution or stability. 3. Large intussusception is seen involving the terminal ileum, right and transverse colon. Possible 19 mm lipoma may be at the lead point. 4. Large sliding-type hiatal hernia is noted. 5. Sigmoid diverticulosis without inflammation. Critical Value/emergent results were called by telephone at the time of interpretation on 04/29/2020 at 3:17 pm to provider [facility] , who verbally acknowledged these results. Aortic Atherosclerosis (ICD10-I70.0). Electronically Signed   By: Marijo Conception M.D.   On: 04/29/2020 15:17   DG Chest Portable 1 View  Result Date: 04/29/2020 CLINICAL DATA:  Shortness of breath. EXAM: PORTABLE CHEST 1 VIEW COMPARISON:  05/24/2017 FINDINGS: Large hiatal hernia, better characterized on priors. Similar lung hyperinflation. Small left pleural effusion versus scar. Bibasilar (left greater than right) opacities. Mild enlargement of the cardiac silhouette, similar to prior. Aortic atherosclerosis. No acute bony abnormality. IMPRESSION: 1. Small left pleural effusion versus scar. Bibasilar opacities may represent atelectasis, although aspiration and/or pneumonia is not excluded. 2. Hyperinflation/COPD. 3. Mild cardiomegaly. Electronically Signed   By: Margaretha Sheffield MD   On: 04/29/2020 12:41   VAS [place] LOWER  EXTREMITY VENOUS (DVT)  Result Date: 04/30/2020  Lower Venous DVTStudy Indications: Edema.  Risk Factors: Confirmed PE. Comparison Study: No prior studies. Performing Technologist: Darlin Coco  Examination Guidelines: A complete evaluation includes B-mode imaging, spectral Doppler, color Doppler, and power Doppler as needed of all accessible portions of each vessel. Bilateral testing is considered an integral part of a complete examination. Limited examinations for reoccurring indications may be performed as noted. The reflux portion of the exam is performed with the patient in reverse Trendelenburg.  +---------+---------------+---------+-----------+----------+--------------+ RIGHT    CompressibilityPhasicitySpontaneityPropertiesThrombus Aging +---------+---------------+---------+-----------+----------+--------------+ CFV      Full           Yes      Yes                                 +---------+---------------+---------+-----------+----------+--------------+ SFJ      Full                                                        +---------+---------------+---------+-----------+----------+--------------+ FV Prox  Full                                                        +---------+---------------+---------+-----------+----------+--------------+  FV Mid   Full                                                        +---------+---------------+---------+-----------+----------+--------------+ FV DistalFull                                                        +---------+---------------+---------+-----------+----------+--------------+ PFV      Full                                                        +---------+---------------+---------+-----------+----------+--------------+ POP      None           No       No         Mobile    Acute          +---------+---------------+---------+-----------+----------+--------------+ PTV      Partial        No       No                    Acute          +---------+---------------+---------+-----------+----------+--------------+ PERO     None           No       No                   Acute          +---------+---------------+---------+-----------+----------+--------------+   +---------+---------------+---------+-----------+----------+--------------+ LEFT     CompressibilityPhasicitySpontaneityPropertiesThrombus Aging +---------+---------------+---------+-----------+----------+--------------+ CFV      Full           Yes      Yes                                 +---------+---------------+---------+-----------+----------+--------------+ SFJ      Full                                                        +---------+---------------+---------+-----------+----------+--------------+ FV Prox  Full                                                        +---------+---------------+---------+-----------+----------+--------------+ FV Mid   Full                                                        +---------+---------------+---------+-----------+----------+--------------+ FV DistalFull                                                        +---------+---------------+---------+-----------+----------+--------------+  PFV      Full                                                        +---------+---------------+---------+-----------+----------+--------------+ POP      Full           Yes      Yes                                 +---------+---------------+---------+-----------+----------+--------------+ PTV      Full                                                        +---------+---------------+---------+-----------+----------+--------------+ PERO     Partial        No       No                   Acute          +---------+---------------+---------+-----------+----------+--------------+     Summary: RIGHT: - Findings consistent with acute deep vein thrombosis involving the right  popliteal vein, right posterior tibial veins, and right peroneal veins. - A cystic structure is found in the popliteal fossa.  LEFT: - Findings consistent with acute deep vein thrombosis involving the left peroneal veins. - No cystic structure found in the popliteal fossa.  *See table(s) above for measurements and observations. Electronically signed by Harold Barban MD on 04/30/2020 at 9:32:39 PM.    Final      Labs:   Basic Metabolic Panel: Recent Labs  Lab 05/01/20 0315 05/01/20 0315 05/02/20 0250 05/02/20 0250 05/03/20 0141 05/03/20 0141 05/04/20 0301 05/05/20 0327  NA 138  --  136  --  134*  --  137 139  K 3.9   < > 3.5   < > 4.1   < > 3.8 4.1  CL 102  --  99  --  99  --  102 103  CO2 27  --  29  --  28  --  28 30  GLUCOSE 128*  --  118*  --  122*  --  118* 101*  BUN <5*  --  <5*  --  <5*  --  7* <5*  CREATININE 0.49  --  0.59  --  0.52  --  0.54 0.50  CALCIUM 8.3*  --  8.0*  --  8.1*  --  8.4* 8.5*  MG 2.2  --  1.6*  --  2.0  --  1.9 1.7  PHOS  --   --  2.4*  --  4.1  --   --  2.7   < > = values in this interval not displayed.   GFR Estimated Creatinine Clearance: 40.7 mL/min (by C-G formula based on SCr of 0.5 mg/dL). Liver Function Tests: Recent Labs  Lab 05/02/20 0250 05/03/20 0141 05/05/20 0327  AST 15 15 14*  ALT 8 7 8   ALKPHOS 34* 33* 27*  BILITOT 0.5 0.5 0.5  PROT 4.3* 4.2* 3.9*  ALBUMIN 2.1* 2.0* 1.8*   No results for input(s): LIPASE, AMYLASE in the last 168 hours.  No results for input(s): AMMONIA in the last 168 hours. Coagulation profile Recent Labs  Lab 05/01/20 0315  INR 1.3*    CBC: Recent Labs  Lab 05/03/20 0141 05/04/20 0301 05/05/20 0327 05/06/20 0318 05/07/20 0411  WBC 9.6 8.5 8.0 10.1 8.6  HGB 12.3 11.6* 10.9* 10.7* 10.5*  HCT 37.9 35.8* 34.7* 34.1* 33.1*  MCV 94.3 95.5 96.7 96.9 95.7  PLT 253 262 288 304 306   Cardiac Enzymes: No results for input(s): CKTOTAL, CKMB, CKMBINDEX, TROPONINI in the last 168 hours. BNP: Invalid  input(s): POCBNP CBG: Recent Labs  Lab 05/04/20 1211  GLUCAP 155*   D-Dimer No results for input(s): DDIMER in the last 72 hours. Hgb A1c No results for input(s): HGBA1C in the last 72 hours. Lipid Profile No results for input(s): CHOL, HDL, LDLCALC, TRIG, CHOLHDL, LDLDIRECT in the last 72 hours. Thyroid function studies No results for input(s): TSH, T4TOTAL, T3FREE, THYROIDAB in the last 72 hours.  Invalid input(s): FREET3 Anemia work up No results for input(s): VITAMINB12, FOLATE, FERRITIN, TIBC, IRON, RETICCTPCT in the last 72 hours. Microbiology Recent Results (from the past 240 hour(s))  SARS Coronavirus 2 by RT PCR (hospital order, performed in [facility] hospital lab) Nasopharyngeal Nasopharyngeal Swab     Status: None   Collection Time: 04/29/20  3:01 PM   Specimen: Nasopharyngeal Swab  Result Value Ref Range Status   SARS Coronavirus 2 NEGATIVE NEGATIVE Final    Comment: (NOTE) SARS-CoV-2 target nucleic acids are NOT DETECTED.  The SARS-CoV-2 RNA is generally detectable in upper and lower respiratory specimens during the acute phase of infection. The lowest concentration of SARS-CoV-2 viral copies this assay can detect is 250 copies / mL. A negative result does not preclude SARS-CoV-2 infection and should not be used as the sole basis for treatment or other patient management decisions.  A negative result may occur with improper specimen collection / handling, submission of specimen other than nasopharyngeal swab, presence of viral mutation(s) within the areas targeted by this assay, and inadequate number of viral copies (<250 copies / mL). A negative result must be combined with clinical observations, patient history, and epidemiological information.  Fact Sheet for Patients:   StrictlyIdeas.no  Fact Sheet for Healthcare Providers: BankingDealers.co.za  This test is not yet approved or  cleared by the [place] FDA and has been authorized for detection and/or diagnosis of SARS-CoV-2 by FDA under an Emergency Use Authorization (EUA).  This EUA will remain in effect (meaning this test can be used) for the duration of the COVID-19 declaration under Section 564(b)(1) of the Act, 21 U.S.C. section 360bbb-3(b)(1), unless the authorization is terminated or revoked sooner.  Performed at [facility], [address], [place], [place] [address]   Surgical pcr screen     Status: None   Collection Time: 05/01/20  3:22 AM   Specimen: Nasal Mucosa; Nasal Swab  Result Value Ref Range Status   MRSA, PCR NEGATIVE NEGATIVE Final   Staphylococcus aureus NEGATIVE NEGATIVE Final    Comment: (NOTE) The Xpert SA Assay (FDA approved for NASAL specimens in patients 71 years of age and older), is one component of a comprehensive surveillance program. It is not intended to diagnose infection nor to guide or monitor treatment. Performed at [facility], [place] [address] [place], [place] [address]   SARS CORONAVIRUS 2 (TAT 6-24 HRS) Nasopharyngeal Nasopharyngeal Swab     Status: None   Collection Time: 05/06/20  9:18 AM  Specimen: Nasopharyngeal Swab  Result Value Ref Range Status   SARS Coronavirus 2 NEGATIVE NEGATIVE Final    Comment: (NOTE) SARS-CoV-2 target nucleic acids are NOT DETECTED.  The SARS-CoV-2 RNA is generally detectable in upper and lower respiratory specimens during the acute phase of infection. Negative results do not preclude SARS-CoV-2 infection, do not rule out co-infections with other pathogens, and should not be used as the sole basis for treatment or other patient management decisions. Negative results must be combined with clinical observations, patient history, and epidemiological information. The expected result is Negative.  Fact Sheet for Patients: SugarRoll.be  Fact Sheet for Healthcare  Providers: https://www.woods-mathews.com/  This test is not yet approved or cleared by the [place] FDA and  has been authorized for detection and/or diagnosis of SARS-CoV-2 by FDA under an Emergency Use Authorization (EUA). This EUA will remain  in effect (meaning this test can be used) for the duration of the COVID-19 declaration under Se ction 564(b)(1) of the Act, 21 U.S.C. section 360bbb-3(b)(1), unless the authorization is terminated or revoked sooner.  Performed at [facility], [place] [address], [place], [place] [address]      Discharge Instructions:   Discharge Instructions    Call MD for:  redness, tenderness, or signs of infection (pain, swelling, redness, odor or green/yellow discharge around incision site)   Complete by: As directed    Call MD for:  temperature >100.4   Complete by: As directed    Diet - low sodium heart healthy   Complete by: As directed    Soft diet   Discharge instructions   Complete by: As directed    Follow up with your primary care provider at the skilled nursing facility in 3-5 days. Follow up with general surgery as has been scheduled. ok to shower from a surgery standpoint   Discharge wound care:   Complete by: As directed    Local abdominal wound care   Increase activity slowly   Complete by: As directed      Allergies as of 05/07/2020      Reactions   Ibuprofen    REACTION: bleed      Medication List    STOP taking these medications   aspirin 81 MG tablet   cyclobenzaprine 5 MG tablet Commonly known as: FLEXERIL     TAKE these medications   acetaminophen 325 MG tablet Commonly known as: TYLENOL Take 2 tablets (650 mg total) by mouth every 6 (six) hours as needed for mild pain.   apixaban 5 MG Tabs tablet Commonly known as: ELIQUIS 10 mg PO BID x 4 days then 5mg  PO BID to continue   fluticasone 50 MCG/ACT nasal spray Commonly known as: FLONASE Place 2 sprays into both nostrils daily as needed for  allergies or rhinitis.   gabapentin 300 MG capsule Commonly known as: NEURONTIN Take 1 capsule (300 mg total) by mouth 2 (two) times daily.   hydrochlorothiazide 25 MG tablet Commonly known as: HYDRODIURIL TAKE 1 TABLET EVERY DAY   ICAPS AREDS 2 PO Take 1 capsule by mouth 2 (two) times daily.   levothyroxine 75 MCG tablet Commonly known as: SYNTHROID Take 1 tablet (75 mcg total) by mouth daily before breakfast.   oxyCODONE 5 MG immediate release tablet Commonly known as: Oxy IR/ROXICODONE Take 1 tablet (5 mg total) by mouth every 6 (six) hours as needed for moderate pain or severe pain.   polyethylene glycol 17 g packet Commonly known as: MIRALAX / GLYCOLAX  Take 17 g by mouth daily as needed for moderate constipation.   potassium chloride 10 MEQ tablet Commonly known as: KLOR-CON Take 1 tablet (10 mEq total) by mouth daily.   psyllium 95 % Pack Commonly known as: HYDROCIL/METAMUCIL Take 1 packet by mouth daily. Hold for diarrhoea   rosuvastatin 5 MG tablet Commonly known as: CRESTOR Take 1 tablet (5 mg total) by mouth 3 (three) times a week. No specific days            Discharge Care Instructions  (From admission, onward)         Start     Ordered   05/06/20 0000  Discharge wound care:       Comments: Local abdominal wound care   05/06/20 1024          Contact information for follow-up providers    Coralie Keens, MD. Go on 05/27/2020.   Specialty: General Surgery Why: at 2:10 PM for post-operative follow up. please arrive 30 minutes early to get checked in and fill out any necessary paperwork.  Contact information: [address] [place] [address] [phone]        Surgery, [place]. Go on 05/15/2020.   Specialty: General Surgery Why: Appointment for staple removal scheduled for 10:00 AM. Please arrive 15 min prior to appointment time for check in. Bring photo ID and insurance card.  Contact information: [address] [place] [address] [phone]            Contact information for after-discharge care    Destination    [facility] Preferred SNF .   Service: Skilled Nursing Contact information: [address] [place] [phone]                  Time coordinating discharge: 39 minutes  Signed:  Debbe Odea  [facility] 05/07/2020, 10:39 AM

## 2020-05-06 NOTE — Progress Notes (Signed)
[facility] Progress Note  5 Days Post-Op  Subjective: Patient denies abdominal pain, tolerating diet. She reports she is still having some loose stools, but she only had 2-3 BM yesterday.   Objective: Vital signs in last 24 hours: Temp:  [98.2 F (36.8 C)-98.8 F (37.1 C)] 98.2 F (36.8 C) (09/28 0755) Pulse Rate:  [85-107] 85 (09/28 0756) Resp:  [16-20] 16 (09/28 0755) BP: (114-148)/(74-98) 137/74 (09/28 0755) SpO2:  [92 %-98 %] 95 % (09/28 0756) Weight:  [60.6 kg] 60.6 kg (09/28 0617) Last BM Date: 05/05/20  Intake/Output from previous day: 09/27 0701 - 09/28 0700 In: 360 [P.O.:360] Out: -  Intake/Output this shift: No intake/output data recorded.  PE: General: pleasant, WD, elderly female who is laying in bed in NAD Heart: regular, rate, and rhythm.  Normal s1,s2. No obvious murmurs, gallops, or rubs noted.  Palpable radial and pedal pulses bilaterally Lungs: CTAB, no wheezes, rhonchi, or rales noted.  Respiratory effort nonlabored Abd: soft, NT, ND, +BS, incision c/d/i with staples present MS: all 4 extremities are symmetrical with no cyanosis, clubbing, or edema. Skin: warm and dry with no masses, lesions, or rashes Psych: A&Ox3 with an appropriate affect.   Lab Results:  Recent Labs    05/05/20 0327 05/06/20 0318  WBC 8.0 10.1  HGB 10.9* 10.7*  HCT 34.7* 34.1*  PLT 288 304   BMET Recent Labs    05/04/20 0301 05/05/20 0327  NA 137 139  K 3.8 4.1  CL 102 103  CO2 28 30  GLUCOSE 118* 101*  BUN 7* <5*  CREATININE 0.54 0.50  CALCIUM 8.4* 8.5*   PT/INR No results for input(s): LABPROT, INR in the last 72 hours. CMP     Component Value Date/Time   NA 139 05/05/2020 0327   NA 140 12/06/2016 0000   K 4.1 05/05/2020 0327   CL 103 05/05/2020 0327   CO2 30 05/05/2020 0327   GLUCOSE 101 (H) 05/05/2020 0327   BUN <5 (L) 05/05/2020 0327   BUN 14 12/06/2016 0000   CREATININE 0.50 05/05/2020 0327   CREATININE 0.64 04/25/2020 1620    CALCIUM 8.5 (L) 05/05/2020 0327   PROT 3.9 (L) 05/05/2020 0327   ALBUMIN 1.8 (L) 05/05/2020 0327   AST 14 (L) 05/05/2020 0327   ALT 8 05/05/2020 0327   ALKPHOS 27 (L) 05/05/2020 0327   BILITOT 0.5 05/05/2020 0327   GFRNONAA >60 05/05/2020 0327   GFRAA >60 05/05/2020 0327   Lipase     Component Value Date/Time   LIPASE 24 04/29/2020 1204       Studies/Results: No results found.  Anti-infectives: Anti-infectives (From admission, onward)   Start     Dose/Rate Route Frequency Ordered Stop   04/29/20 2030  cefTRIAXone (ROCEPHIN) 1 g in sodium chloride 0.9 % 100 mL IVPB  Status:  Discontinued        1 g 200 mL/hr over 30 Minutes Intravenous Every 24 hours 04/29/20 2019 05/04/20 1104   04/29/20 2030  azithromycin (ZITHROMAX) 500 mg in sodium chloride 0.9 % 250 mL IVPB        500 mg 250 mL/hr over 60 Minutes Intravenous Every 24 hours 04/29/20 2019 05/03/20 2338   04/29/20 2030  metroNIDAZOLE (FLAGYL) IVPB 500 mg  Status:  Discontinued        500 mg 100 mL/hr over 60 Minutes Intravenous Every 8 hours 04/29/20 2019 05/01/20 1141   04/29/20 1715  metroNIDAZOLE (FLAGYL) IVPB 500 mg  500 mg 100 mL/hr over 60 Minutes Intravenous  Once 04/29/20 1701 04/29/20 1904   04/29/20 1545  cefTRIAXone (ROCEPHIN) 1 g in sodium chloride 0.9 % 100 mL IVPB        1 g 200 mL/hr over 30 Minutes Intravenous  Once 04/29/20 1541 04/29/20 1626   04/29/20 1545  azithromycin (ZITHROMAX) 500 mg in sodium chloride 0.9 % 250 mL IVPB        500 mg 250 mL/hr over 60 Minutes Intravenous  Once 04/29/20 1541 04/29/20 1757       Assessment/Plan HTN Hypothyroidism HLD Hypokalemia and hypomagnesemia - replace with goal of K >4.0 and Mg >2.0  Bilateral PE with hypoxia- on supplemental O2 LUL mass on CT- PNA vs neoplasm, repeat CT in 3-4 weeks recommended  Intussusception S/p open extended R hemicolectomy with stapled anastomosis 05/01/20 Dr. Ninfa Linden  - POD#5 - continue soft diet, cont fiber to  help with loose stools - ok to shower from a surgery standpoint - continue PT/OT - recommending SNF - surgical path pending - patient stable for discharge from a surgical perspective, follow up in AVS  FEN: soft diet   VTE: eliquis ID: off all abx currently   LOS: 7 days    Norm Parcel , [facility] Surgery 05/06/2020, 9:22 AM Please see Amion for pager number during day hours 7:00am-4:30pm

## 2020-05-07 ENCOUNTER — Other Ambulatory Visit: Payer: Self-pay

## 2020-05-07 DIAGNOSIS — I82453 Acute embolism and thrombosis of peroneal vein, bilateral: Secondary | ICD-10-CM | POA: Diagnosis not present

## 2020-05-07 DIAGNOSIS — M6281 Muscle weakness (generalized): Secondary | ICD-10-CM | POA: Diagnosis not present

## 2020-05-07 DIAGNOSIS — K922 Gastrointestinal hemorrhage, unspecified: Secondary | ICD-10-CM | POA: Diagnosis not present

## 2020-05-07 DIAGNOSIS — J309 Allergic rhinitis, unspecified: Secondary | ICD-10-CM | POA: Diagnosis not present

## 2020-05-07 DIAGNOSIS — J069 Acute upper respiratory infection, unspecified: Secondary | ICD-10-CM | POA: Diagnosis not present

## 2020-05-07 DIAGNOSIS — M1990 Unspecified osteoarthritis, unspecified site: Secondary | ICD-10-CM | POA: Diagnosis not present

## 2020-05-07 DIAGNOSIS — I361 Nonrheumatic tricuspid (valve) insufficiency: Secondary | ICD-10-CM | POA: Diagnosis not present

## 2020-05-07 DIAGNOSIS — I82431 Acute embolism and thrombosis of right popliteal vein: Secondary | ICD-10-CM | POA: Diagnosis not present

## 2020-05-07 DIAGNOSIS — Z9049 Acquired absence of other specified parts of digestive tract: Secondary | ICD-10-CM | POA: Diagnosis not present

## 2020-05-07 DIAGNOSIS — I10 Essential (primary) hypertension: Secondary | ICD-10-CM | POA: Diagnosis not present

## 2020-05-07 DIAGNOSIS — R52 Pain, unspecified: Secondary | ICD-10-CM | POA: Diagnosis not present

## 2020-05-07 DIAGNOSIS — Z853 Personal history of malignant neoplasm of breast: Secondary | ICD-10-CM | POA: Diagnosis not present

## 2020-05-07 DIAGNOSIS — M8580 Other specified disorders of bone density and structure, unspecified site: Secondary | ICD-10-CM | POA: Diagnosis present

## 2020-05-07 DIAGNOSIS — Y838 Other surgical procedures as the cause of abnormal reaction of the patient, or of later complication, without mention of misadventure at the time of the procedure: Secondary | ICD-10-CM | POA: Diagnosis present

## 2020-05-07 DIAGNOSIS — I447 Left bundle-branch block, unspecified: Secondary | ICD-10-CM | POA: Diagnosis present

## 2020-05-07 DIAGNOSIS — Z833 Family history of diabetes mellitus: Secondary | ICD-10-CM | POA: Diagnosis not present

## 2020-05-07 DIAGNOSIS — E785 Hyperlipidemia, unspecified: Secondary | ICD-10-CM | POA: Diagnosis not present

## 2020-05-07 DIAGNOSIS — Z823 Family history of stroke: Secondary | ICD-10-CM | POA: Diagnosis not present

## 2020-05-07 DIAGNOSIS — E039 Hypothyroidism, unspecified: Secondary | ICD-10-CM | POA: Diagnosis not present

## 2020-05-07 DIAGNOSIS — R2681 Unsteadiness on feet: Secondary | ICD-10-CM | POA: Diagnosis not present

## 2020-05-07 DIAGNOSIS — R9431 Abnormal electrocardiogram [ECG] [EKG]: Secondary | ICD-10-CM | POA: Diagnosis not present

## 2020-05-07 DIAGNOSIS — K9189 Other postprocedural complications and disorders of digestive system: Secondary | ICD-10-CM | POA: Diagnosis not present

## 2020-05-07 DIAGNOSIS — Z7401 Bed confinement status: Secondary | ICD-10-CM | POA: Diagnosis not present

## 2020-05-07 DIAGNOSIS — Z20822 Contact with and (suspected) exposure to covid-19: Secondary | ICD-10-CM | POA: Diagnosis not present

## 2020-05-07 DIAGNOSIS — R58 Hemorrhage, not elsewhere classified: Secondary | ICD-10-CM | POA: Diagnosis not present

## 2020-05-07 DIAGNOSIS — R918 Other nonspecific abnormal finding of lung field: Secondary | ICD-10-CM | POA: Diagnosis present

## 2020-05-07 DIAGNOSIS — I69828 Other speech and language deficits following other cerebrovascular disease: Secondary | ICD-10-CM | POA: Diagnosis not present

## 2020-05-07 DIAGNOSIS — R5381 Other malaise: Secondary | ICD-10-CM | POA: Diagnosis not present

## 2020-05-07 DIAGNOSIS — R1312 Dysphagia, oropharyngeal phase: Secondary | ICD-10-CM | POA: Diagnosis not present

## 2020-05-07 DIAGNOSIS — R41841 Cognitive communication deficit: Secondary | ICD-10-CM | POA: Diagnosis not present

## 2020-05-07 DIAGNOSIS — Z48815 Encounter for surgical aftercare following surgery on the digestive system: Secondary | ICD-10-CM | POA: Diagnosis not present

## 2020-05-07 DIAGNOSIS — I2699 Other pulmonary embolism without acute cor pulmonale: Secondary | ICD-10-CM | POA: Diagnosis not present

## 2020-05-07 DIAGNOSIS — K561 Intussusception: Secondary | ICD-10-CM | POA: Diagnosis not present

## 2020-05-07 DIAGNOSIS — I82441 Acute embolism and thrombosis of right tibial vein: Secondary | ICD-10-CM | POA: Diagnosis not present

## 2020-05-07 DIAGNOSIS — M2550 Pain in unspecified joint: Secondary | ICD-10-CM | POA: Diagnosis not present

## 2020-05-07 DIAGNOSIS — R0902 Hypoxemia: Secondary | ICD-10-CM | POA: Diagnosis not present

## 2020-05-07 DIAGNOSIS — Z7901 Long term (current) use of anticoagulants: Secondary | ICD-10-CM | POA: Diagnosis not present

## 2020-05-07 DIAGNOSIS — Z8249 Family history of ischemic heart disease and other diseases of the circulatory system: Secondary | ICD-10-CM | POA: Diagnosis not present

## 2020-05-07 DIAGNOSIS — E876 Hypokalemia: Secondary | ICD-10-CM | POA: Diagnosis present

## 2020-05-07 DIAGNOSIS — D62 Acute posthemorrhagic anemia: Secondary | ICD-10-CM | POA: Diagnosis not present

## 2020-05-07 DIAGNOSIS — G629 Polyneuropathy, unspecified: Secondary | ICD-10-CM | POA: Diagnosis present

## 2020-05-07 LAB — CBC
HCT: 33.1 % — ABNORMAL LOW (ref 36.0–46.0)
Hemoglobin: 10.5 g/dL — ABNORMAL LOW (ref 12.0–15.0)
MCH: 30.3 pg (ref 26.0–34.0)
MCHC: 31.7 g/dL (ref 30.0–36.0)
MCV: 95.7 fL (ref 80.0–100.0)
Platelets: 306 10*3/uL (ref 150–400)
RBC: 3.46 MIL/uL — ABNORMAL LOW (ref 3.87–5.11)
RDW: 13.4 % (ref 11.5–15.5)
WBC: 8.6 10*3/uL (ref 4.0–10.5)
nRBC: 0 % (ref 0.0–0.2)

## 2020-05-07 LAB — SURGICAL PATHOLOGY

## 2020-05-07 MED ORDER — OXYCODONE HCL 5 MG PO TABS
5.0000 mg | ORAL_TABLET | Freq: Four times a day (QID) | ORAL | 0 refills | Status: DC | PRN
Start: 2020-05-07 — End: 2020-05-12

## 2020-05-07 NOTE — Telephone Encounter (Signed)
Per provider [name], Dinah, NP. Patient needs medication pend and sent to her. "Oxycodone 5mg  every 6 hours PRN for moderate to severe pain.

## 2020-05-07 NOTE — Progress Notes (Addendum)
Pt discharged from unit by [facility]. Medication/discharge instruction given to [place] at [employer] , [place], RN

## 2020-05-07 NOTE — TOC Transition Note (Signed)
Transition of Care [facility]) - CM/SW Discharge Note   Patient Details  Name: NATORI GUDINO MRN: [id number] Date of Birth: 06/10/1928  Transition of Care [facility]) CM/SW Contact:  Vinie Sill, [place] Phone Number: 05/07/2020, 11:20 AM   Clinical Narrative:     Patient will DC to: [facility] Date: 05/07/2020 Family Notified: [name] -granddaughter,left voice message Transport By: Corey Harold   Per MD patient is ready for discharge. RN, patient, and facility notified of DC. Discharge Summary sent to facility. RN given number for [phone]. Ambulance transport requested for patient.   Clinical Social Worker signing off.  Thurmond Butts, MSW, [place] Clinical Social Worker    Final next level of care: Skilled Nursing Facility Barriers to Discharge: Barriers Resolved   Patient Goals and CMS Choice        Discharge Placement PASRR number recieved: 04/30/20            Patient chooses bed at: [facility] Patient to be transferred to facility by: [place] Name of family member notified: Carolyn,granddaughter Patient and family notified of of transfer: 05/06/20  Discharge Plan and Services In-house Referral: Clinical Social Work                                   Social Determinants of Health (SDOH) Interventions     Readmission Risk Interventions No flowsheet data found.

## 2020-05-07 NOTE — Progress Notes (Signed)
Occupational Therapy Treatment Patient Details Name: Ariel Medina MRN: [id number] DOB: 10-Dec-1927 Today's Date: 05/07/2020    History of present illness Pt is a 86 y.o. female admitted 04/29/20 with diarrhea and bloody stool. Chest imaging with bilateral PEs; LUL density suspect for inflammation vs neoplasm. Abdominal CT with intussusception of her terminal ileum and R-sided colon. S/p extended right hemicolectomy on 9/23. Ultrasound with RLE DVTs. PMH includes OA, HTN, HLD, breast CA, syncope.   OT comments  Patient wanting to get out of the bed and move a little.  Discharge to SNF expected this date.  Patient able to roll and sit up with min A and cues for log rolling given abdominal incision.  Able to walk with RW and Min Guard to toilet, stand grooming task sink side with SBA and rest break.  O2 down to 85% on RA.  Cued to purse lip breath, and O2  rebounded to 89%.  O2 placed at 1 L via [place], patient rebounded to 92%.  VS checked and BP 141/80 at the conclusion of session.  2/4 dyspnea reported by the patient.  Patient is expecting to discharge to SNF today.  Discharge plan remains appropriate.  OT will continue to follow if she remains in the acute setting.    Follow Up Recommendations  SNF;Supervision/Assistance - 24 hour    Equipment Recommendations    TBA at the next level.     Recommendations for Other Services      Precautions / Restrictions Precautions Precautions: Fall Precaution Comments: Bowel/bladder urgency/incontinence (has Depends in room); abdominal precautions for comfort, watch 02 Restrictions Weight Bearing Restrictions: No       Mobility Bed Mobility   Bed Mobility: Rolling Rolling: Supervision Sidelying to sit: Min assist;HOB elevated          Transfers Overall transfer level: Needs assistance Equipment used: Rolling walker (2 wheeled) Transfers: Sit to/from Stand Sit to Stand: Min assist              Balance   Sitting-balance support: Feet  supported;No upper extremity supported Sitting balance-Leahy Scale: Good     Standing balance support: Bilateral upper extremity supported Standing balance-Leahy Scale: Fair                             ADL either performed or assessed with clinical judgement   ADL       Grooming: Supervision/safety;Standing                   Toilet Transfer: Min guard;RW;Ambulation   Toileting- Clothing Manipulation and Hygiene: Sit to/from stand;Min guard Toileting - Clothing Manipulation Details (indicate cue type and reason): patient able to urinate.  Min Guard for standing brief management.     Functional mobility during ADLs: Rolling walker;Moderate assistance General ADL Comments: slowed pace, cues for breathing.  O2 monitored.  Difficulty removing hands from RW to perform pant management.  Cued for one hand at a time with assist as needed.                     General Comments      Pertinent Vitals/ Pain       Faces Pain Scale: Hurts a little bit Pain Location: abdomen Pain Descriptors / Indicators: Sore Pain Intervention(s): Monitored during session  Frequency  Min 2X/week        Progress Toward Goals  OT Goals(current goals can now be found in the care plan section)  Progress towards OT goals: Progressing toward goals  Acute Rehab OT Goals Patient Stated Goal: Patient states she is going to rehab today. OT Goal Formulation: With patient Time For Goal Achievement: 05/17/20 Potential to Achieve Goals: Good  Plan Discharge plan remains appropriate    Co-evaluation                 AM-PAC OT "6 Clicks" Daily Activity     Outcome Measure   Help from another person eating meals?: None Help from another person taking care of personal grooming?: A Little Help from another person toileting, which includes using toliet, bedpan, or urinal?: A Lot Help from  another person bathing (including washing, rinsing, drying)?: A Lot Help from another person to put on and taking off regular upper body clothing?: A Little Help from another person to put on and taking off regular lower body clothing?: A Lot 6 Click Score: 16    End of Session Equipment Utilized During Treatment: Rolling walker;Oxygen  OT Visit Diagnosis: Unsteadiness on feet (R26.81);Other abnormalities of gait and mobility (R26.89);Muscle weakness (generalized) (M62.81);Pain   Activity Tolerance Patient tolerated treatment well   Patient Left in chair;with call bell/phone within reach;with chair alarm set   Nurse Communication Other (comment) (patient up in recliner)        Time: 5732-2025 OT Time Calculation (min): 16 min  Charges: OT General Charges $OT Visit: 1 Visit OT Treatments $Self Care/Home Management : 8-22 mins  05/07/2020  Rich, OTR/L  Acute Rehabilitation Services  Office:  [phone]    Metta Clines 05/07/2020, 11:30 AM

## 2020-05-07 NOTE — Progress Notes (Signed)
[facility] Progress Note  6 Days Post-Op  Subjective: No new complaints. Says she was supposed to go to [facility] last night but they never picked her up. Reports mild abdominal soreness. Sitting up eating eggs and toast. Reports BM x 1 yesterday.   Path is still pending.   Objective: Vital signs in last 24 hours: Temp:  [98 F (36.7 C)-98.5 F (36.9 C)] 98 F (36.7 C) (09/29 0745) Pulse Rate:  [84-100] 91 (09/29 0800) Resp:  [15-25] 25 (09/29 0800) BP: (107-155)/(64-122) 155/122 (09/29 0800) SpO2:  [92 %-98 %] 98 % (09/29 0800) Weight:  [62.6 kg] 62.6 kg (09/29 0630) Last BM Date: 05/06/20  Intake/Output from previous day: 09/28 0701 - 09/29 0700 In: 120 [P.O.:120] Out: -  Intake/Output this shift: No intake/output data recorded.  PE: General: pleasant, WD, elderly female who is sitting in bed in NAD Lungs:  Respiratory effort nonlabored Abd: soft, NT, ND, +BS, incision c/d/i with staples present no cellulitis  MS: all 4 extremities are symmetrical with no cyanosis, clubbing, or edema. Skin: warm and dry with no masses, lesions, or rashes Psych: A&Ox3 with an appropriate affect.  Lab Results:  Recent Labs    05/06/20 0318 05/07/20 0411  WBC 10.1 8.6  HGB 10.7* 10.5*  HCT 34.1* 33.1*  PLT 304 306   BMET Recent Labs    05/05/20 0327  NA 139  K 4.1  CL 103  CO2 30  GLUCOSE 101*  BUN <5*  CREATININE 0.50  CALCIUM 8.5*   PT/INR No results for input(s): LABPROT, INR in the last 72 hours. CMP     Component Value Date/Time   NA 139 05/05/2020 0327   NA 140 12/06/2016 0000   K 4.1 05/05/2020 0327   CL 103 05/05/2020 0327   CO2 30 05/05/2020 0327   GLUCOSE 101 (H) 05/05/2020 0327   BUN <5 (L) 05/05/2020 0327   BUN 14 12/06/2016 0000   CREATININE 0.50 05/05/2020 0327   CREATININE 0.64 04/25/2020 1620   CALCIUM 8.5 (L) 05/05/2020 0327   PROT 3.9 (L) 05/05/2020 0327   ALBUMIN 1.8 (L) 05/05/2020 0327   AST 14 (L) 05/05/2020 0327   ALT 8  05/05/2020 0327   ALKPHOS 27 (L) 05/05/2020 0327   BILITOT 0.5 05/05/2020 0327   GFRNONAA >60 05/05/2020 0327   GFRAA >60 05/05/2020 0327   Lipase     Component Value Date/Time   LIPASE 24 04/29/2020 1204       Studies/Results: No results found.  Anti-infectives: Anti-infectives (From admission, onward)   Start     Dose/Rate Route Frequency Ordered Stop   04/29/20 2030  cefTRIAXone (ROCEPHIN) 1 g in sodium chloride 0.9 % 100 mL IVPB  Status:  Discontinued        1 g 200 mL/hr over 30 Minutes Intravenous Every 24 hours 04/29/20 2019 05/04/20 1104   04/29/20 2030  azithromycin (ZITHROMAX) 500 mg in sodium chloride 0.9 % 250 mL IVPB        500 mg 250 mL/hr over 60 Minutes Intravenous Every 24 hours 04/29/20 2019 05/03/20 2338   04/29/20 2030  metroNIDAZOLE (FLAGYL) IVPB 500 mg  Status:  Discontinued        500 mg 100 mL/hr over 60 Minutes Intravenous Every 8 hours 04/29/20 2019 05/01/20 1141   04/29/20 1715  metroNIDAZOLE (FLAGYL) IVPB 500 mg        500 mg 100 mL/hr over 60 Minutes Intravenous  Once 04/29/20 1701 04/29/20 1904  04/29/20 1545  cefTRIAXone (ROCEPHIN) 1 g in sodium chloride 0.9 % 100 mL IVPB        1 g 200 mL/hr over 30 Minutes Intravenous  Once 04/29/20 1541 04/29/20 1626   04/29/20 1545  azithromycin (ZITHROMAX) 500 mg in sodium chloride 0.9 % 250 mL IVPB        500 mg 250 mL/hr over 60 Minutes Intravenous  Once 04/29/20 1541 04/29/20 1757       Assessment/Plan HTN Hypothyroidism HLD Hypokalemia and hypomagnesemia - replace with goal of K >4.0 and Mg >2.0  Bilateral PE with hypoxia- on supplemental O2 LUL mass on CT- PNA vs neoplasm, repeat CT in 3-4 weeks recommended  Intussusception S/p open extended R hemicolectomy with stapled anastomosis 05/01/20 Dr. Ninfa Linden  - POD#6 - continue soft diet, fiber supplementation  - continue PT/OT - recommending SNF - surgical path pending  - patient stable for discharge from a surgical perspective,  follow up arranged.   FEN: soft diet   VTE: eliquis ID: off all abx currently   LOS: 8 days    Jill Alexanders , [facility] Surgery 05/07/2020, 8:19 AM Please see Amion for pager number during day hours 7:00am-4:30pm

## 2020-05-08 ENCOUNTER — Inpatient Hospital Stay (HOSPITAL_COMMUNITY)
Admission: EM | Admit: 2020-05-08 | Discharge: 2020-05-12 | DRG: 393 | Disposition: A | Discharge status: Skilled Nursing Facility | Payer: Medicare HMO | Source: Skilled Nursing Facility | Attending: Family Medicine | Admitting: Family Medicine

## 2020-05-08 ENCOUNTER — Observation Stay (HOSPITAL_COMMUNITY): Payer: Medicare HMO

## 2020-05-08 ENCOUNTER — Encounter (HOSPITAL_COMMUNITY): Payer: Self-pay

## 2020-05-08 ENCOUNTER — Observation Stay (HOSPITAL_BASED_OUTPATIENT_CLINIC_OR_DEPARTMENT_OTHER): Payer: Medicare HMO

## 2020-05-08 ENCOUNTER — Other Ambulatory Visit: Payer: Self-pay

## 2020-05-08 DIAGNOSIS — M6281 Muscle weakness (generalized): Secondary | ICD-10-CM | POA: Diagnosis not present

## 2020-05-08 DIAGNOSIS — R918 Other nonspecific abnormal finding of lung field: Secondary | ICD-10-CM | POA: Diagnosis present

## 2020-05-08 DIAGNOSIS — I361 Nonrheumatic tricuspid (valve) insufficiency: Secondary | ICD-10-CM | POA: Diagnosis not present

## 2020-05-08 DIAGNOSIS — E876 Hypokalemia: Secondary | ICD-10-CM | POA: Diagnosis present

## 2020-05-08 DIAGNOSIS — K922 Gastrointestinal hemorrhage, unspecified: Secondary | ICD-10-CM | POA: Diagnosis present

## 2020-05-08 DIAGNOSIS — Z8249 Family history of ischemic heart disease and other diseases of the circulatory system: Secondary | ICD-10-CM

## 2020-05-08 DIAGNOSIS — Z823 Family history of stroke: Secondary | ICD-10-CM | POA: Diagnosis not present

## 2020-05-08 DIAGNOSIS — R9431 Abnormal electrocardiogram [ECG] [EKG]: Secondary | ICD-10-CM

## 2020-05-08 DIAGNOSIS — Z853 Personal history of malignant neoplasm of breast: Secondary | ICD-10-CM | POA: Diagnosis not present

## 2020-05-08 DIAGNOSIS — E785 Hyperlipidemia, unspecified: Secondary | ICD-10-CM | POA: Diagnosis present

## 2020-05-08 DIAGNOSIS — I447 Left bundle-branch block, unspecified: Secondary | ICD-10-CM | POA: Diagnosis present

## 2020-05-08 DIAGNOSIS — J069 Acute upper respiratory infection, unspecified: Secondary | ICD-10-CM | POA: Diagnosis not present

## 2020-05-08 DIAGNOSIS — Y838 Other surgical procedures as the cause of abnormal reaction of the patient, or of later complication, without mention of misadventure at the time of the procedure: Secondary | ICD-10-CM | POA: Diagnosis present

## 2020-05-08 DIAGNOSIS — M8580 Other specified disorders of bone density and structure, unspecified site: Secondary | ICD-10-CM | POA: Diagnosis present

## 2020-05-08 DIAGNOSIS — I2699 Other pulmonary embolism without acute cor pulmonale: Secondary | ICD-10-CM | POA: Diagnosis present

## 2020-05-08 DIAGNOSIS — R52 Pain, unspecified: Secondary | ICD-10-CM | POA: Diagnosis not present

## 2020-05-08 DIAGNOSIS — R2681 Unsteadiness on feet: Secondary | ICD-10-CM | POA: Diagnosis not present

## 2020-05-08 DIAGNOSIS — Z7401 Bed confinement status: Secondary | ICD-10-CM | POA: Diagnosis not present

## 2020-05-08 DIAGNOSIS — Z79890 Hormone replacement therapy (postmenopausal): Secondary | ICD-10-CM

## 2020-05-08 DIAGNOSIS — Z833 Family history of diabetes mellitus: Secondary | ICD-10-CM

## 2020-05-08 DIAGNOSIS — I82453 Acute embolism and thrombosis of peroneal vein, bilateral: Secondary | ICD-10-CM | POA: Diagnosis present

## 2020-05-08 DIAGNOSIS — Z20822 Contact with and (suspected) exposure to covid-19: Secondary | ICD-10-CM | POA: Diagnosis present

## 2020-05-08 DIAGNOSIS — R41841 Cognitive communication deficit: Secondary | ICD-10-CM | POA: Diagnosis not present

## 2020-05-08 DIAGNOSIS — Z9049 Acquired absence of other specified parts of digestive tract: Secondary | ICD-10-CM | POA: Diagnosis not present

## 2020-05-08 DIAGNOSIS — D62 Acute posthemorrhagic anemia: Secondary | ICD-10-CM | POA: Diagnosis present

## 2020-05-08 DIAGNOSIS — G629 Polyneuropathy, unspecified: Secondary | ICD-10-CM | POA: Diagnosis present

## 2020-05-08 DIAGNOSIS — M2550 Pain in unspecified joint: Secondary | ICD-10-CM | POA: Diagnosis not present

## 2020-05-08 DIAGNOSIS — K9189 Other postprocedural complications and disorders of digestive system: Principal | ICD-10-CM | POA: Diagnosis present

## 2020-05-08 DIAGNOSIS — I10 Essential (primary) hypertension: Secondary | ICD-10-CM | POA: Diagnosis present

## 2020-05-08 DIAGNOSIS — R531 Weakness: Secondary | ICD-10-CM | POA: Diagnosis not present

## 2020-05-08 DIAGNOSIS — Z7901 Long term (current) use of anticoagulants: Secondary | ICD-10-CM | POA: Diagnosis not present

## 2020-05-08 DIAGNOSIS — Z8719 Personal history of other diseases of the digestive system: Secondary | ICD-10-CM

## 2020-05-08 DIAGNOSIS — I82431 Acute embolism and thrombosis of right popliteal vein: Secondary | ICD-10-CM | POA: Diagnosis present

## 2020-05-08 DIAGNOSIS — I82403 Acute embolism and thrombosis of unspecified deep veins of lower extremity, bilateral: Secondary | ICD-10-CM | POA: Diagnosis not present

## 2020-05-08 DIAGNOSIS — R0902 Hypoxemia: Secondary | ICD-10-CM | POA: Diagnosis present

## 2020-05-08 DIAGNOSIS — K561 Intussusception: Secondary | ICD-10-CM | POA: Diagnosis not present

## 2020-05-08 DIAGNOSIS — I82441 Acute embolism and thrombosis of right tibial vein: Secondary | ICD-10-CM | POA: Diagnosis present

## 2020-05-08 DIAGNOSIS — E039 Hypothyroidism, unspecified: Secondary | ICD-10-CM | POA: Diagnosis present

## 2020-05-08 DIAGNOSIS — Z79899 Other long term (current) drug therapy: Secondary | ICD-10-CM

## 2020-05-08 DIAGNOSIS — R58 Hemorrhage, not elsewhere classified: Secondary | ICD-10-CM | POA: Diagnosis not present

## 2020-05-08 HISTORY — DX: Anemia, unspecified: D64.9

## 2020-05-08 LAB — GIARDIA AND CRYPTOSPORIDIUM ANTIGEN PANEL
MICRO NUMBER:: 10970625
RESULT:: NOT DETECTED
SPECIMEN QUALITY:: ADEQUATE
Specimen Quality:: ADEQUATE
micro Number:: 10970624

## 2020-05-08 LAB — COMPREHENSIVE METABOLIC PANEL
ALT: 11 U/L (ref 0–44)
AST: 18 U/L (ref 15–41)
Albumin: 2.1 g/dL — ABNORMAL LOW (ref 3.5–5.0)
Alkaline Phosphatase: 31 U/L — ABNORMAL LOW (ref 38–126)
Anion gap: 7 (ref 5–15)
BUN: 7 mg/dL — ABNORMAL LOW (ref 8–23)
CO2: 34 mmol/L — ABNORMAL HIGH (ref 22–32)
Calcium: 8.7 mg/dL — ABNORMAL LOW (ref 8.9–10.3)
Chloride: 98 mmol/L (ref 98–111)
Creatinine, Ser: 0.5 mg/dL (ref 0.44–1.00)
GFR calc Af Amer: >60 mL/min (ref >60)
GFR calc non Af Amer: >60 mL/min (ref >60)
Glucose, Bld: 106 mg/dL — ABNORMAL HIGH (ref 70–99)
Potassium: 4.1 mmol/L (ref 3.5–5.1)
Sodium: 139 mmol/L (ref 135–145)
Total Bilirubin: 0.7 mg/dL (ref 0.3–1.2)
Total Protein: 4.4 g/dL — ABNORMAL LOW (ref 6.5–8.1)

## 2020-05-08 LAB — APTT: aPTT: 34 seconds (ref 24–36)

## 2020-05-08 LAB — TROPONIN I (HIGH SENSITIVITY): Troponin I (High Sensitivity): 7 ng/L (ref <18)

## 2020-05-08 LAB — PROTIME-INR
INR: 1.5 — ABNORMAL HIGH (ref 0.8–1.2)
Prothrombin Time: 17.2 seconds — ABNORMAL HIGH (ref 11.4–15.2)

## 2020-05-08 LAB — ECHOCARDIOGRAM COMPLETE
Area-P 1/2: 4.83 cm2
S' Lateral: 2.1 cm

## 2020-05-08 LAB — CBC
HCT: 31.1 % — ABNORMAL LOW (ref 36.0–46.0)
Hemoglobin: 9.9 g/dL — ABNORMAL LOW (ref 12.0–15.0)
MCH: 30.8 pg (ref 26.0–34.0)
MCHC: 31.8 g/dL (ref 30.0–36.0)
MCV: 96.9 fL (ref 80.0–100.0)
Platelets: 336 10*3/uL (ref 150–400)
RBC: 3.21 MIL/uL — ABNORMAL LOW (ref 3.87–5.11)
RDW: 13.6 % (ref 11.5–15.5)
WBC: 8.8 10*3/uL (ref 4.0–10.5)
nRBC: 0 % (ref 0.0–0.2)

## 2020-05-08 LAB — OVA AND PARASITE EXAMINATION
CONCENTRATE RESULT:: NONE SEEN
MICRO NUMBER:: 10970626
SPECIMEN QUALITY:: ADEQUATE
TRICHROME RESULT:: NONE SEEN

## 2020-05-08 LAB — TRAUMA TEG PANEL
CFF Max Amplitude: 29.9 mm (ref 15–32)
Citrated Kaolin (R): 6.6 min (ref 4.6–9.1)
Citrated Rapid TEG (MA): 66.6 mm (ref 52–70)
Lysis at 30 Minutes: 1.1 % (ref 0.0–2.6)

## 2020-05-08 LAB — RESPIRATORY PANEL BY RT PCR (FLU A&B, COVID)
Influenza A by PCR: NEGATIVE
Influenza B by PCR: NEGATIVE
SARS Coronavirus 2 by RT PCR: NEGATIVE

## 2020-05-08 LAB — HEMOGLOBIN AND HEMATOCRIT, BLOOD
HCT: 27.5 % — ABNORMAL LOW (ref 36.0–46.0)
HCT: 25.5 % — ABNORMAL LOW (ref 36.0–46.0)
HCT: 24 % — ABNORMAL LOW (ref 36.0–46.0)
Hemoglobin: 8.7 g/dL — ABNORMAL LOW (ref 12.0–15.0)
Hemoglobin: 8.1 g/dL — ABNORMAL LOW (ref 12.0–15.0)
Hemoglobin: 7.6 g/dL — ABNORMAL LOW (ref 12.0–15.0)

## 2020-05-08 LAB — POC OCCULT BLOOD, ED: Fecal Occult Bld: POSITIVE — AB

## 2020-05-08 LAB — MAGNESIUM: Magnesium: 1.7 mg/dL (ref 1.7–2.4)

## 2020-05-08 MED ORDER — LEVOTHYROXINE SODIUM 75 MCG PO TABS
75.0000 ug | ORAL_TABLET | Freq: Every day | ORAL | Status: DC
  Administered 2020-05-08 – 2020-05-12 (×5): 75 ug via ORAL
  Filled 2020-05-08 (×5): qty 1

## 2020-05-08 MED ORDER — ACETAMINOPHEN 325 MG PO TABS
650.0000 mg | ORAL_TABLET | Freq: Four times a day (QID) | ORAL | Status: DC | PRN
  Administered 2020-05-08 – 2020-05-09 (×2): 650 mg via ORAL
  Filled 2020-05-08 (×2): qty 2

## 2020-05-08 MED ORDER — ENSURE ENLIVE PO LIQD
237.0000 mL | Freq: Two times a day (BID) | ORAL | Status: DC
  Administered 2020-05-08 – 2020-05-12 (×7): 237 mL via ORAL

## 2020-05-08 MED ORDER — GABAPENTIN 300 MG PO CAPS
300.0000 mg | ORAL_CAPSULE | Freq: Two times a day (BID) | ORAL | Status: DC
  Administered 2020-05-08 – 2020-05-12 (×9): 300 mg via ORAL
  Filled 2020-05-08 (×9): qty 1

## 2020-05-08 MED ORDER — FLUTICASONE PROPIONATE 50 MCG/ACT NA SUSP
2.0000 | Freq: Every day | NASAL | Status: DC | PRN
  Filled 2020-05-08: qty 16

## 2020-05-08 MED ORDER — ROSUVASTATIN CALCIUM 5 MG PO TABS
5.0000 mg | ORAL_TABLET | ORAL | Status: DC
  Administered 2020-05-09 – 2020-05-12 (×2): 5 mg via ORAL
  Filled 2020-05-08 (×4): qty 1

## 2020-05-08 MED ORDER — OXYCODONE HCL 5 MG PO TABS: 5.0000 mg | ORAL_TABLET | Freq: Four times a day (QID) | ORAL | Status: DC | PRN

## 2020-05-08 MED ORDER — TRANEXAMIC ACID-NACL 1000-0.7 MG/100ML-% IV SOLN: 1000.0000 mg | INTRAVENOUS | Status: DC

## 2020-05-08 MED ORDER — PROTHROMBIN COMPLEX CONC HUMAN 500 UNITS IV KIT
3113.0000 [IU] | PACK | Status: AC
  Administered 2020-05-08: 3113 [IU] via INTRAVENOUS
  Filled 2020-05-08: qty 3113

## 2020-05-08 MED ORDER — LACTATED RINGERS IV SOLN: INTRAVENOUS | Status: AC

## 2020-05-08 MED ORDER — PROTHROMBIN COMPLEX CONC HUMAN 500 UNITS IV KIT
3113.0000 [IU] | PACK | Status: DC
  Filled 2020-05-08: qty 3113

## 2020-05-08 MED ORDER — PSYLLIUM 95 % PO PACK
1.0000 | PACK | Freq: Every day | ORAL | Status: DC
  Administered 2020-05-08 – 2020-05-12 (×5): 1 via ORAL
  Filled 2020-05-08 (×5): qty 1

## 2020-05-08 MED ORDER — TRANEXAMIC ACID-NACL 1000-0.7 MG/100ML-% IV SOLN
1000.0000 mg | Freq: Once | INTRAVENOUS | Status: AC
  Administered 2020-05-08: 1000 mg via INTRAVENOUS
  Filled 2020-05-08: qty 100

## 2020-05-08 NOTE — Progress Notes (Signed)
Patient seen and examined this morning, admitted overnight, H&P reviewed and agree with the assessment and plan.    In brief, this is a 86 year old female with history of breast cancer, diverticulitis, HTN, HLD, hypothyroidism who was recently admitted to the hospital 9/21-9/29 intussusception with mild rectal bleeding and this was treated surgically with open extended right hemicolectomy on 9/23.  Hospital course was complicated by diagnosis of bilateral PEs without right heart strain as well as bilateral lower extremity DVTs for which she was on Eliquis.  She was discharged from the hospital, went to SNF however was brought back to the hospital the next day due to bright red blood per rectum.  Acute lower GI bleed-currently hemodynamically stable, monitor H&H and transfuse to maintain hemoglobin above 7.  General surgery consulted, discussed with Dr. Donne Hazel, this may be anastomotic bleed in the setting of anticoagulation, he recommends holding anticoagulation, will give Kcentra and once bleeding is stopped we will introduce heparin  Recent bilateral PE, bilateral lower extremity DVTs-monitor closely now, hold anticoagulation as above  New LBBB -asymptomatic, high-sensitivity troponin negative.  2D echo pending  HTN-hold antihypertensives given bleed  Hyperlipidemia-continue statin  Hypothyroidism-continue Synthroid  Scheduled Meds: Continuous Infusions: . lactated ringers 125 mL/hr at 05/08/20 0634  . prothrombin complex conc human (Kcentra) IVPB     PRN Meds:.   Ski Polich M. Cruzita Lederer, MD, PhD [facility]  Between 7 am - 7 pm you can contact me via [place] or [place].  I am not available 7 pm - 7 am, please contact night coverage MD/APP via Amion

## 2020-05-08 NOTE — H&P (Signed)
History and Physical    Ariel Medina:[id number] DOB: 1927/09/05 DOA: 05/08/2020  PCP: Colon Branch, MD Patient coming from: [facility]  Chief Complaint: Rectal bleeding  HPI: Ariel Medina is a 86 y.o. female with medical history significant of breast cancer, diverticulitis, hypertension, hyperlipidemia, hypothyroidism, osteoarthritis, osteopenia presented to the ED with complaints of rectal bleeding.  Patient was recently admitted to the hospital 9/21-9/29 for intussusception with mild rectal bleeding which was treated surgically with an open extended right hemicolectomy on 9/23.  Patient was also diagnosed with bilateral PE without right heart strain and bilateral lower extremity DVTs for which she was started on Eliquis.  Patient states she was discharged from the hospital yesterday and around 3 AM this morning started having rectal bleeding so she was sent back to the emergency room.  Denies abdominal pain or hematemesis.  Denies dizziness/lightheadedness, chest pain, or shortness of breath.  Reports having a dry mouth.  No other complaints.  ED Course: Afebrile.  Slightly tachycardic and tachypneic.  Blood pressure stable.  Not hypoxic.  Hemoglobin 9.9, was 10.5 at the time of hospital discharge yesterday.  Noted to have bright red blood per rectum on exam.  FOBT positive.  Patient was given tranexamic acid for anticoagulation reversal.  SARS-CoV-2 PCR test pending.  Review of Systems:  All systems reviewed and apart from history of presenting illness, are negative.  Past Medical History:  Diagnosis Date  . Breast CA ([place])    left breast-invasive ductal ca stage 1-stopped arimidex 2008  . Diverticulitis of colon   . Hiatal hernia   . Hyperlipidemia   . Hypertension   . Hypothyroidism   . Osteoarthritis   . Osteopenia    DEXA 7/02  . Syncope 2005   CT head (-), ECHO essent. neg, stress test (-), carotid u/s (-)    Past Surgical History:  Procedure Laterality Date  .  BREAST BIOPSY Bilateral   . BREAST LUMPECTOMY  04/2002   left  . PARTIAL COLECTOMY N/A 05/01/2020   Procedure: PARTIAL COLECTOMY;  Surgeon: Coralie Keens, MD;  Location: [facility];  Service: General;  Laterality: N/A;  . TONSILLECTOMY AND ADENOIDECTOMY       reports that she has never smoked. She has never used smokeless tobacco. She reports that she does not drink alcohol and does not use drugs.  Allergies  Allergen Reactions  . Ibuprofen     REACTION: bleed    Family History  Problem Relation Age of Onset  . Heart attack Brother 59  . Diabetes Mother   . Stroke Father 29  . Stroke Sister 66  . Colon cancer Neg Hx   . Breast cancer Neg Hx     Prior to Admission medications   Medication Sig Start Date End Date Taking? Authorizing Provider  acetaminophen (TYLENOL) 325 MG tablet Take 2 tablets (650 mg total) by mouth every 6 (six) hours as needed for mild pain. 05/06/20   Pokhrel, Corrie Mckusick, MD  apixaban (ELIQUIS) 5 MG TABS tablet 10 mg PO BID x 4 days then 5mg  PO BID to continue 05/06/20   Pokhrel, Laxman, MD  fluticasone (FLONASE) 50 MCG/ACT nasal spray Place 2 sprays into both nostrils daily as needed for allergies or rhinitis. Patient not taking: Reported on 04/30/2020 08/03/17   Colon Branch, MD  gabapentin (NEURONTIN) 300 MG capsule Take 1 capsule (300 mg total) by mouth 2 (two) times daily. 05/06/20   Pokhrel, Corrie Mckusick, MD  hydrochlorothiazide (HYDRODIURIL) 25 MG tablet  TAKE 1 TABLET EVERY DAY Patient taking differently: Take 25 mg by mouth daily.  04/03/20   Colon Branch, MD  levothyroxine (SYNTHROID) 75 MCG tablet Take 1 tablet (75 mcg total) by mouth daily before breakfast. 09/12/19   Colon Branch, MD  Multiple Vitamins-Minerals (ICAPS AREDS 2 PO) Take 1 capsule by mouth 2 (two) times daily.    [provider]  oxyCODONE (OXY IR/ROXICODONE) 5 MG immediate release tablet Take 1 tablet (5 mg total) by mouth every 6 (six) hours as needed for moderate pain or severe pain. 05/07/20    [name], [name], NP  polyethylene glycol (MIRALAX / GLYCOLAX) packet Take 17 g by mouth daily as needed for moderate constipation. Patient not taking: Reported on 04/29/2020 11/30/16   Cristal Ford, DO  potassium chloride (K-DUR,KLOR-CON) 10 MEQ tablet Take 1 tablet (10 mEq total) by mouth daily. Patient not taking: Reported on 04/30/2020 07/07/17   Colon Branch, MD  psyllium (HYDROCIL/METAMUCIL) 95 % PACK Take 1 packet by mouth daily. Hold for diarrhoea 05/07/20   Pokhrel, Corrie Mckusick, MD  rosuvastatin (CRESTOR) 5 MG tablet Take 1 tablet (5 mg total) by mouth 3 (three) times a week. No specific days 05/07/20   Flora Lipps, MD    Physical Exam: Vitals:   05/08/20 0415 05/08/20 0430 05/08/20 0445 05/08/20 0500  BP: 137/88 125/74 130/82 130/71  Pulse: (!) 103 97 95 93  Resp: (!) 27 19 (!) 25 (!) 22  Temp:      TempSrc:      SpO2: 100% 97% 98% 98%    Physical Exam Constitutional:      General: She is not in acute distress. HENT:     Head: Normocephalic.     Mouth/Throat:     Mouth: Mucous membranes are dry.  Eyes:     Extraocular Movements: Extraocular movements intact.     Conjunctiva/sclera: Conjunctivae normal.  Cardiovascular:     Rate and Rhythm: Normal rate and regular rhythm.     Pulses: Normal pulses.  Pulmonary:     Effort: No respiratory distress.     Breath sounds: No wheezing or rales.     Comments: Slightly tachypneic with respiratory rate in the low 20s Abdominal:     General: Bowel sounds are normal. There is no distension.     Tenderness: There is abdominal tenderness. There is guarding.     Comments: Right-sided abdominal tenderness and guarding on minimal palpation  Musculoskeletal:     Cervical back: Normal range of motion and neck supple.     Right lower leg: Edema present.     Left lower leg: Edema present.  Skin:    General: Skin is warm and dry.  Neurological:     General: No focal deficit present.     Mental Status: She is alert and oriented to  person, place, and time.     Labs on Admission: I have personally reviewed following labs and imaging studies  CBC: Recent Labs  Lab 05/04/20 0301 05/05/20 0327 05/06/20 0318 05/07/20 0411 05/08/20 0408  WBC 8.5 8.0 10.1 8.6 8.8  HGB 11.6* 10.9* 10.7* 10.5* 9.9*  HCT 35.8* 34.7* 34.1* 33.1* 31.1*  MCV 95.5 96.7 96.9 95.7 96.9  PLT 262 288 304 306 856   Basic Metabolic Panel: Recent Labs  Lab 05/02/20 0250 05/03/20 0141 05/04/20 0301 05/05/20 0327 05/08/20 0408  NA 136 134* 137 139 139  K 3.5 4.1 3.8 4.1 4.1  CL 99 99 102 103 98  CO2 29 28 28 30  34*  GLUCOSE 118* 122* 118* 101* 106*  BUN <5* <5* 7* <5* 7*  CREATININE 0.59 0.52 0.54 0.50 0.50  CALCIUM 8.0* 8.1* 8.4* 8.5* 8.7*  MG 1.6* 2.0 1.9 1.7  --   PHOS 2.4* 4.1  --  2.7  --    GFR: Estimated Creatinine Clearance: 40.7 mL/min (by C-G formula based on SCr of 0.5 mg/dL). Liver Function Tests: Recent Labs  Lab 05/02/20 0250 05/03/20 0141 05/05/20 0327 05/08/20 0408  AST 15 15 14* 18  ALT 8 7 8 11   ALKPHOS 34* 33* 27* 31*  BILITOT 0.5 0.5 0.5 0.7  PROT 4.3* 4.2* 3.9* 4.4*  ALBUMIN 2.1* 2.0* 1.8* 2.1*   No results for input(s): LIPASE, AMYLASE in the last 168 hours. No results for input(s): AMMONIA in the last 168 hours. Coagulation Profile: Recent Labs  Lab 05/08/20 0408  INR 1.5*   Cardiac Enzymes: No results for input(s): CKTOTAL, CKMB, CKMBINDEX, TROPONINI in the last 168 hours. BNP (last 3 results) No results for input(s): PROBNP in the last 8760 hours. HbA1C: No results for input(s): HGBA1C in the last 72 hours. CBG: Recent Labs  Lab 05/04/20 1211  GLUCAP 155*   Lipid Profile: No results for input(s): CHOL, HDL, LDLCALC, TRIG, CHOLHDL, LDLDIRECT in the last 72 hours. Thyroid Function Tests: No results for input(s): TSH, T4TOTAL, FREET4, T3FREE, THYROIDAB in the last 72 hours. Anemia Panel: No results for input(s): VITAMINB12, FOLATE, FERRITIN, TIBC, IRON, RETICCTPCT in the last 72  hours. Urine analysis:    Component Value Date/Time   BILIRUBINUR 1 12/26/2014 1628   PROTEINUR 100 12/26/2014 1628   UROBILINOGEN 0.2 12/26/2014 1628   NITRITE Neg 12/26/2014 1628   LEUKOCYTESUR Trace 12/26/2014 1628    Radiological Exams on Admission: No results found.  EKG: Independently reviewed.  Sinus rhythm, new LAFB and LBBB, QTC 509.  Assessment/Plan Principal Problem:   GI bleed Active Problems:   Hypothyroidism   Hyperlipidemia   Essential hypertension   Acute pulmonary embolism (HCC)   Suspected acute lower GI bleed: Currently hemodynamically stable.  Hemoglobin 9.9, was 10.5 yesterday.  Patient had bright red blood on rectal exam and FOBT positive.  She is on Eliquis for recently diagnosed bilateral PE and bilateral DVTs.  She was discharged from the hospital yesterday after being treated for intussusception for which he underwent open extended right hemicolectomy on 9/23.  Has right-sided abdominal tenderness to minimal palpation and guarding on exam.  Bleeding could also possibly be diverticular given history of diverticulosis. -Hold Eliquis at this time.  She was given tranexamic acid in the ED for anticoagulation reversal.  CT abdomen pelvis without contrast.  Serial H&H.  Keep n.p.o., IV fluid hydration.  GI (Dr. Loletha Carrow) has been consulted via secure chat.  Recently diagnosed bilateral PE without right heart strain and bilateral lower extremity DVTs: Slightly tachypneic but not hypoxic. -Patient received tranexamic acid in the ED for anticoagulation reversal given concern for acute lower GI bleed.  Hold Eliquis at this time.  Ordering serial H&H, if stable and okay with GI, consider resuming anticoagulation with IV heparin.  New LBBB: Patient is not endorsing any anginal symptoms. -Cardiac monitoring, high-sensitivity troponin, echocardiogram  QT prolongation on EKG -Keep potassium above 4 and magnesium above 2.  Avoid QT prolonging drugs if  possible.  Hypertension -Hold antihypertensives at this time  Hyperlipidemia -Resume statin when patient is no longer n.p.o.  Hypothyroidism -Resume Synthroid after pharmacy med rec is done.  DVT prophylaxis: Holding anticoagulation given concern for acute lower GI bleed.  Holding SCDs given recently diagnosed bilateral lower extremity DVTs. Code Status: CODE STATUS was discussed at length with the patient.  She wishes to be FULL CODE. Family Communication: No family available at this time. Disposition Plan: Status is: Observation  The patient remains OBS appropriate and will d/c before 2 midnights.  Dispo: The patient is from: [facility]              Anticipated d/c is to: SNF              Anticipated d/c date is: 2 days              Patient currently is not medically stable to d/c.  The medical decision making on this patient was of high complexity and the patient is at high risk for clinical deterioration, therefore this is a level 3 visit.  Shela Leff MD [facility]  If 7PM-7AM, please contact night-coverage [email or web address]  05/08/2020, 6:09 AM

## 2020-05-08 NOTE — Progress Notes (Signed)
  Echocardiogram 2D Echocardiogram has been performed.  Ariel Medina 05/08/2020, 8:59 AM

## 2020-05-08 NOTE — ED Provider Notes (Addendum)
[facility] EMERGENCY DEPARTMENT Provider Note   CSN: [id number] Arrival date & time: 05/08/20  0348     History Chief Complaint  Patient presents with  . GI Bleeding    Ariel Medina is a 86 y.o. female.  HPI    86 year old female comes in a chief complaint of bloody stool.  She has no abdominal pain, nausea, vomiting.  She denies any history of GI bleed until few days ago when she had bloody diarrhea secondary to intussusception.  Patient denies any dizziness, chest pain, shortness of breath.  Patient was just discharged from the hospital and arrived to [facility].  She was admitted to the hospital recently for GI bleed secondary to intussusception.  Patient is status post surgical repair for intussusception on 923.  Subsequently she developed PE and was started on Eliquis.   Past Medical History:  Diagnosis Date  . Breast CA ([place])    left breast-invasive ductal ca stage 1-stopped arimidex 2008  . Diverticulitis of colon   . Hiatal hernia   . Hyperlipidemia   . Hypertension   . Hypothyroidism   . Osteoarthritis   . Osteopenia    DEXA 7/02  . Syncope 2005   CT head (-), ECHO essent. neg, stress test (-), carotid u/s (-)    Patient Active Problem List   Diagnosis Date Noted  . Goals of care, counseling/discussion   . Palliative care by specialist   . DNR (do not resuscitate) discussion   . Intussusception ([place]) 04/29/2020  . Pulmonary embolism, bilateral ([place]) 04/29/2020  . GI bleed 04/29/2020  . Abnormal CT scan of lung 04/29/2020  . Hypoxia 04/29/2020  . Acute pulmonary embolism ([place]) 04/29/2020  . Pre-ulcerative calluses 01/25/2020  . Essential hypertension 11/29/2016  . Closed fracture of multiple pubic rami ([place]) 11/29/2016  . Fall   . Closed fracture of left pubis ([place]) 11/28/2016  . Hypokalemia 11/28/2016  . Hearing loss 09/19/2015  . PCP NOTES >>> 05/13/2015  . Allergic rhinitis 10/24/2014  . Annual physical exam  04/14/2011  . BACK PAIN, shoulder pain 12/11/2007  . Osteoporosis 10/20/2007  . NEOPLASM, MALIGNANT, BREAST, HX OF 10/20/2007  . Hypothyroidism 01/30/2007  . Hyperlipidemia 01/30/2007  . HTN and mild edema 01/30/2007  . OSTEOARTHRITIS 01/30/2007    Past Surgical History:  Procedure Laterality Date  . BREAST BIOPSY Bilateral   . BREAST LUMPECTOMY  04/2002   left  . PARTIAL COLECTOMY N/A 05/01/2020   Procedure: PARTIAL COLECTOMY;  Surgeon: Coralie Keens, MD;  Location: [facility];  Service: General;  Laterality: N/A;  . TONSILLECTOMY AND ADENOIDECTOMY       OB History   No obstetric history on file.     Family History  Problem Relation Age of Onset  . Heart attack Brother 48  . Diabetes Mother   . Stroke Father 23  . Stroke Sister 1  . Colon cancer Neg Hx   . Breast cancer Neg Hx     Social History   Tobacco Use  . Smoking status: Never Smoker  . Smokeless tobacco: Never Used  Vaping Use  . Vaping Use: Never used  Substance Use Topics  . Alcohol use: No  . Drug use: No    Home Medications Prior to Admission medications   Medication Sig Start Date End Date Taking? Authorizing Provider  acetaminophen (TYLENOL) 325 MG tablet Take 2 tablets (650 mg total) by mouth every 6 (six) hours as needed for mild pain. 05/06/20  Pokhrel, Laxman, MD  apixaban (ELIQUIS) 5 MG TABS tablet 10 mg PO BID x 4 days then 5mg  PO BID to continue 05/06/20   Pokhrel, Laxman, MD  fluticasone (FLONASE) 50 MCG/ACT nasal spray Place 2 sprays into both nostrils daily as needed for allergies or rhinitis. Patient not taking: Reported on 04/30/2020 08/03/17   Colon Branch, MD  gabapentin (NEURONTIN) 300 MG capsule Take 1 capsule (300 mg total) by mouth 2 (two) times daily. 05/06/20   Pokhrel, Corrie Mckusick, MD  hydrochlorothiazide (HYDRODIURIL) 25 MG tablet TAKE 1 TABLET EVERY DAY Patient taking differently: Take 25 mg by mouth daily.  04/03/20   Colon Branch, MD  levothyroxine (SYNTHROID) 75 MCG tablet Take 1  tablet (75 mcg total) by mouth daily before breakfast. 09/12/19   Colon Branch, MD  Multiple Vitamins-Minerals (ICAPS AREDS 2 PO) Take 1 capsule by mouth 2 (two) times daily.    [provider]  oxyCODONE (OXY IR/ROXICODONE) 5 MG immediate release tablet Take 1 tablet (5 mg total) by mouth every 6 (six) hours as needed for moderate pain or severe pain. 05/07/20   [name], [name], NP  polyethylene glycol (MIRALAX / GLYCOLAX) packet Take 17 g by mouth daily as needed for moderate constipation. Patient not taking: Reported on 04/29/2020 11/30/16   Cristal Ford, DO  potassium chloride (K-DUR,KLOR-CON) 10 MEQ tablet Take 1 tablet (10 mEq total) by mouth daily. Patient not taking: Reported on 04/30/2020 07/07/17   Colon Branch, MD  psyllium (HYDROCIL/METAMUCIL) 95 % PACK Take 1 packet by mouth daily. Hold for diarrhoea 05/07/20   Pokhrel, Corrie Mckusick, MD  rosuvastatin (CRESTOR) 5 MG tablet Take 1 tablet (5 mg total) by mouth 3 (three) times a week. No specific days 05/07/20   Flora Lipps, MD    Allergies    Ibuprofen  Review of Systems   Review of Systems  Constitutional: Positive for activity change.  Respiratory: Negative for shortness of breath.   Cardiovascular: Negative for chest pain.  Neurological: Negative for light-headedness.  Hematological: Bruises/bleeds easily.  All other systems reviewed and are negative.   Physical Exam Updated Vital Signs BP 130/71   Pulse 93   Temp 98.6 F (37 C) (Temporal)   Resp (!) 22   SpO2 98%   Physical Exam Vitals and nursing note reviewed.  Constitutional:      Appearance: She is well-developed.  HENT:     Head: Normocephalic and atraumatic.  Cardiovascular:     Rate and Rhythm: Normal rate.  Pulmonary:     Effort: Pulmonary effort is normal.  Abdominal:     General: Bowel sounds are normal.     Tenderness: There is no abdominal tenderness.  Musculoskeletal:     Cervical back: Normal range of motion and neck supple.  Skin:     General: Skin is warm and dry.  Neurological:     Mental Status: She is alert and oriented to person, place, and time.     ED Results / Procedures / Treatments   Labs (all labs ordered are listed, but only abnormal results are displayed) Labs Reviewed  COMPREHENSIVE METABOLIC PANEL - Abnormal; Notable for the following components:      Result Value   CO2 34 (*)    Glucose, Bld 106 (*)    BUN 7 (*)    Calcium 8.7 (*)    Total Protein 4.4 (*)    Albumin 2.1 (*)    Alkaline Phosphatase 31 (*)    All other  components within normal limits  CBC - Abnormal; Notable for the following components:   RBC 3.21 (*)    Hemoglobin 9.9 (*)    HCT 31.1 (*)    All other components within normal limits  PROTIME-INR - Abnormal; Notable for the following components:   Prothrombin Time 17.2 (*)    INR 1.5 (*)    All other components within normal limits  POC OCCULT BLOOD, ED - Abnormal; Notable for the following components:   Fecal Occult Bld POSITIVE (*)    All other components within normal limits  RESPIRATORY PANEL BY RT PCR (FLU A&B, COVID)  APTT  TYPE AND SCREEN    EKG EKG Interpretation  Date/Time:  Thursday May 08 2020 03:54:12 EDT Ventricular Rate:  99 PR Interval:    QRS Duration: 146 QT Interval:  396 QTC Calculation: 509 R Axis:   -47 Text Interpretation: Sinus rhythm Left bundle branch block - new Confirmed by Varney Biles ([id number]) on 05/08/2020 4:45:27 AM   Radiology No results found.  Procedures .Critical Care Performed by: Varney Biles, MD Authorized by: Varney Biles, MD   Critical care provider statement:    Critical care time (minutes):  52   Critical care was time spent personally by me on the following activities:  Discussions with consultants, evaluation of patient's response to treatment, examination of patient, ordering and performing treatments and interventions, ordering and review of laboratory studies, ordering and review of radiographic  studies, pulse oximetry, re-evaluation of patient's condition, obtaining history from patient or surrogate and review of old charts   (including critical care time)  Medications Ordered in ED Medications  tranexamic acid (CYKLOKAPRON) IVPB 1,000 mg (1,000 mg Intravenous New Bag/Given 05/08/20 0538)  lactated ringers infusion (has no administration in time range)    ED Course  I have reviewed the triage vital signs and the nursing notes.  Pertinent labs & imaging results that were available during my care of the patient were reviewed by me and considered in my medical decision making (see chart for details).  Clinical Course as of May 08 545  Thu May 08, 2020  9326 Close to baseline normal.  Anticipating her CBC to drop.  Hemoglobin(!): 9.9 [AN]  0508 Supports the thought that patient is unlikely having upper GI bleed.  BUN(!): 7 [AN]  0544 Secure chat message sent to Dr. Loletha Carrow, [facility].  Results of the ER work-up discussed with the patient and the granddaughter.  She remains n.p.o.   [AN]    Clinical Course User Index [AN] Varney Biles, MD   MDM Rules/Calculators/A&P                          86 year old female comes in a chief complaint of bloody stool.  Patient is noted to have BRBPR.  She is passing clots.  She is on Eliquis.  She has no abdominal tenderness.  Patient had a recent CT scan that in addition to intussusception, which was repaired, she was noted to have diverticulosis.  Suspicion is that likely she is having diverticular bleed.  We will give her 1 g of TXA.  Appropriate labs ordered.  Final Clinical Impression(s) / ED Diagnoses Final diagnoses:  Acute GI bleeding    Rx / DC Orders ED Discharge Orders    None       Varney Biles, MD 05/08/20 [place], Corinne, MD 05/08/20 [phone]

## 2020-05-08 NOTE — Progress Notes (Signed)
[facility] Progress Note     Subjective: Patient brought back to ED with bloody BM. Denies pain. Alert and oriented.   Objective: Vital signs in last 24 hours: Temp:  [98.2 F (36.8 C)-98.6 F (37 C)] 98.6 F (37 C) (09/30 0357) Pulse Rate:  [87-103] 91 (09/30 0630) Resp:  [15-27] 22 (09/30 0630) BP: (96-151)/(62-93) 128/74 (09/30 0630) SpO2:  [92 %-100 %] 98 % (09/30 0630)    Intake/Output from previous day: 09/29 0701 - 09/30 0700 In: 92.5 [IV Piggyback:92.5] Out: -  Intake/Output this shift: No intake/output data recorded.  PE: General: pleasant, WD, elderly female who is sitting in bed in NAD Lungs:  Respiratory effort nonlabored Abd: soft, NT, ND, +BS, incision c/d/i with staples present no cellulitis, large bloody BM present  MS: all 4 extremities are symmetrical with no cyanosis, clubbing, or edema. Skin: warm and dry with no masses, lesions, or rashes Psych: A&Ox3 with an appropriate affect.   Lab Results:  Recent Labs    05/07/20 0411 05/07/20 0411 05/08/20 0408 05/08/20 0812  WBC 8.6  --  8.8  --   HGB 10.5*   < > 9.9* 8.7*  HCT 33.1*   < > 31.1* 27.5*  PLT 306  --  336  --    < > = values in this interval not displayed.   BMET Recent Labs    05/08/20 0408  NA 139  K 4.1  CL 98  CO2 34*  GLUCOSE 106*  BUN 7*  CREATININE 0.50  CALCIUM 8.7*   PT/INR Recent Labs    05/08/20 0408  LABPROT 17.2*  INR 1.5*   CMP     Component Value Date/Time   NA 139 05/08/2020 0408   NA 140 12/06/2016 0000   K 4.1 05/08/2020 0408   CL 98 05/08/2020 0408   CO2 34 (H) 05/08/2020 0408   GLUCOSE 106 (H) 05/08/2020 0408   BUN 7 (L) 05/08/2020 0408   BUN 14 12/06/2016 0000   CREATININE 0.50 05/08/2020 0408   CREATININE 0.64 04/25/2020 1620   CALCIUM 8.7 (L) 05/08/2020 0408   PROT 4.4 (L) 05/08/2020 0408   ALBUMIN 2.1 (L) 05/08/2020 0408   AST 18 05/08/2020 0408   ALT 11 05/08/2020 0408   ALKPHOS 31 (L) 05/08/2020 0408   BILITOT 0.7  05/08/2020 0408   GFRNONAA >60 05/08/2020 0408   GFRAA >60 05/08/2020 0408   Lipase     Component Value Date/Time   LIPASE 24 04/29/2020 1204       Studies/Results: CT ABDOMEN PELVIS WO CONTRAST  Result Date: 05/08/2020 CLINICAL DATA:  Recent partial colectomy.  Gastrointestinal bleeding EXAM: CT ABDOMEN AND PELVIS WITHOUT CONTRAST TECHNIQUE: Multidetector CT imaging of the abdomen and pelvis was performed following the standard protocol without oral or IV contrast. COMPARISON:  April 29, 2020 FINDINGS: Lower chest: There are pleural effusions bilaterally. There is compressive atelectasis in the lung bases with suspected superimposed degree of pneumonia in the posterior left base. There is a sizable hiatal type hernia present. There is a suspected lipoma within the hiatal hernia measuring 7 x 7 mm. Hepatobiliary: No focal liver lesions are evident on this noncontrast enhanced study. The gallbladder wall is not appreciably thickened. There is no biliary duct dilatation. Pancreas: There is a partially calcified apparent mass arising along the rightward aspect of the head of the pancreas eccentrically measuring 1.7 x 1.7 x 1.7 cm. No other pancreatic lesion evident. Spleen: No splenic lesions are appreciable. Adrenals/Urinary Tract:  Adrenals bilaterally appear normal. There is a cyst arising from the posterior mid right kidney measuring 1.7 x 1.7 cm. There is a cyst lobulated cyst arising from the lower pole the left kidney laterally measuring 2.6 x 2.5 cm. There is no appreciable hydronephrosis on either side. There is no appreciable renal or ureteral calculus on either side. Urinary bladder is midline with wall thickness within normal limits. Stomach/Bowel: Postoperative changes noted in the transverse colon region proximally with patent anastomoses. No fluid collection or extraluminal air seen in this area of recent surgery. Slight soft tissue stranding in this area is likely of postoperative  etiology. There are multiple sigmoid diverticula without demonstrable diverticulitis. There is no demonstrable bowel obstruction. The terminal ileum region appears normal. There is no demonstrable free air or portal venous air. Vascular/Lymphatic: No abdominal aortic aneurysm. There is aortic and iliac artery atherosclerosis. There is a left retroaortic renal vein, an anatomic variant. No adenopathy evident in the abdomen or pelvis. Reproductive: Uterus is anteverted.  There is no adnexal mass. Other: There is no appreciable periappendiceal region inflammation. No abscess or ascites is evident in the abdomen or pelvis. Musculoskeletal: Bones are osteoporotic. There is degenerative change in each hip joint. There is also degenerative change in the lumbar spine. No blastic or lytic bone lesions. There is no intramuscular or abdominal wall lesion evident. IMPRESSION: 1. Status post removal of previous lesion in the colon near the hepatic flexure involving the proximal transverse colon. Anastomosis patent in this area. Slight soft tissue stranding in this area is felt to be due to recent surgery. There is no evidence suggesting perforation or abscess. No fluid is seen surrounding this area of recent surgery. 2.  Sigmoid diverticula without diverticulitis. 3. Pleural effusions bilaterally. Compressive atelectasis in the lung bases with suspected focus of pneumonia in the posterior left base. 4. Sizable hiatal hernia. Small lipoma noted within the rightward aspect of this hiatal hernia. 5.  No bowel obstruction.  No abscess in the abdomen or pelvis. 6. 1.7 x 1.7 x 1.7 cm partially calcified mass arising eccentrically from the pancreatic head. Significance of this finding in this age group is uncertain. Particular attention this area on subsequent evaluations is warranted. Pancreas otherwise appears normal. 7.  Aortic Atherosclerosis (ICD10-I70.0). Comment: A site for gastrointestinal bleeding has not been established with  this study. If bleeding continues, nuclear medicine gastrointestinal bleeding study may be helpful for further assessment and potential localization of bleeding site. Electronically Signed   By: Lowella Grip III M.D.   On: 05/08/2020 08:19    Anti-infectives: Anti-infectives (From admission, onward)   None       Assessment/Plan HTN Hypothyroidism HLD Hypokalemia and hypomagnesemia - replace with goal of K >4.0 and Mg >2.0  Bilateral PE with hypoxia- on supplemental O2 LUL mass on CT- PNA vs neoplasm, repeat CT in 3-4 weeks recommended  Intussusception S/p open extended R hemicolectomy with stapled anastomosis 05/01/20 Dr. Ninfa Linden  - POD#7 - surgical path showed submucosal lipoma, no malignancy - patient returned to ED with bloody BM - hgb was 9.9 on admit from 10.5 day of dc, 8.7 this AM - hold eliquis, give KCentra, check TEG - patient received TXA  - serial h/h - ok to have FLD - no indication for surgical or GI intervention acutely but we will follow and consult GI if needed   FEN: FLD  VTE: eliquis on hold ID: off all abx currently   LOS: 0 days    Claiborne Billings  Baldo Daub , [facility] 05/08/2020, 10:09 AM Please see Amion for pager number during day hours 7:00am-4:30pm

## 2020-05-08 NOTE — ED Triage Notes (Signed)
Pt comes via [facility] from [facility], left hospital yesterday for GI bleed, bleeding started again tonight, dark red with clots, on Eliquis

## 2020-05-09 DIAGNOSIS — Z853 Personal history of malignant neoplasm of breast: Secondary | ICD-10-CM | POA: Diagnosis not present

## 2020-05-09 DIAGNOSIS — E785 Hyperlipidemia, unspecified: Secondary | ICD-10-CM | POA: Diagnosis present

## 2020-05-09 DIAGNOSIS — Z7901 Long term (current) use of anticoagulants: Secondary | ICD-10-CM | POA: Diagnosis not present

## 2020-05-09 DIAGNOSIS — I82441 Acute embolism and thrombosis of right tibial vein: Secondary | ICD-10-CM | POA: Diagnosis present

## 2020-05-09 DIAGNOSIS — J069 Acute upper respiratory infection, unspecified: Secondary | ICD-10-CM | POA: Diagnosis not present

## 2020-05-09 DIAGNOSIS — E876 Hypokalemia: Secondary | ICD-10-CM | POA: Diagnosis present

## 2020-05-09 DIAGNOSIS — Z8249 Family history of ischemic heart disease and other diseases of the circulatory system: Secondary | ICD-10-CM | POA: Diagnosis not present

## 2020-05-09 DIAGNOSIS — R9431 Abnormal electrocardiogram [ECG] [EKG]: Secondary | ICD-10-CM | POA: Diagnosis present

## 2020-05-09 DIAGNOSIS — Z9049 Acquired absence of other specified parts of digestive tract: Secondary | ICD-10-CM | POA: Diagnosis not present

## 2020-05-09 DIAGNOSIS — Y838 Other surgical procedures as the cause of abnormal reaction of the patient, or of later complication, without mention of misadventure at the time of the procedure: Secondary | ICD-10-CM | POA: Diagnosis present

## 2020-05-09 DIAGNOSIS — I82431 Acute embolism and thrombosis of right popliteal vein: Secondary | ICD-10-CM | POA: Diagnosis present

## 2020-05-09 DIAGNOSIS — K9189 Other postprocedural complications and disorders of digestive system: Secondary | ICD-10-CM | POA: Diagnosis present

## 2020-05-09 DIAGNOSIS — Z833 Family history of diabetes mellitus: Secondary | ICD-10-CM | POA: Diagnosis not present

## 2020-05-09 DIAGNOSIS — I82453 Acute embolism and thrombosis of peroneal vein, bilateral: Secondary | ICD-10-CM | POA: Diagnosis present

## 2020-05-09 DIAGNOSIS — R0902 Hypoxemia: Secondary | ICD-10-CM | POA: Diagnosis present

## 2020-05-09 DIAGNOSIS — I2699 Other pulmonary embolism without acute cor pulmonale: Secondary | ICD-10-CM | POA: Diagnosis present

## 2020-05-09 DIAGNOSIS — I447 Left bundle-branch block, unspecified: Secondary | ICD-10-CM | POA: Diagnosis present

## 2020-05-09 DIAGNOSIS — M8580 Other specified disorders of bone density and structure, unspecified site: Secondary | ICD-10-CM | POA: Diagnosis present

## 2020-05-09 DIAGNOSIS — E039 Hypothyroidism, unspecified: Secondary | ICD-10-CM | POA: Diagnosis present

## 2020-05-09 DIAGNOSIS — Z823 Family history of stroke: Secondary | ICD-10-CM | POA: Diagnosis not present

## 2020-05-09 DIAGNOSIS — I10 Essential (primary) hypertension: Secondary | ICD-10-CM | POA: Diagnosis present

## 2020-05-09 DIAGNOSIS — R918 Other nonspecific abnormal finding of lung field: Secondary | ICD-10-CM | POA: Diagnosis present

## 2020-05-09 DIAGNOSIS — Z20822 Contact with and (suspected) exposure to covid-19: Secondary | ICD-10-CM | POA: Diagnosis present

## 2020-05-09 DIAGNOSIS — D62 Acute posthemorrhagic anemia: Secondary | ICD-10-CM | POA: Diagnosis present

## 2020-05-09 DIAGNOSIS — K922 Gastrointestinal hemorrhage, unspecified: Secondary | ICD-10-CM | POA: Diagnosis not present

## 2020-05-09 DIAGNOSIS — G629 Polyneuropathy, unspecified: Secondary | ICD-10-CM | POA: Diagnosis present

## 2020-05-09 LAB — PREPARE RBC (CROSSMATCH)

## 2020-05-09 LAB — HEMOGLOBIN AND HEMATOCRIT, BLOOD
HCT: 23.1 % — ABNORMAL LOW (ref 36.0–46.0)
HCT: 27.7 % — ABNORMAL LOW (ref 36.0–46.0)
Hemoglobin: 7.3 g/dL — ABNORMAL LOW (ref 12.0–15.0)
Hemoglobin: 8.9 g/dL — ABNORMAL LOW (ref 12.0–15.0)

## 2020-05-09 MED ORDER — ACETAMINOPHEN 325 MG PO TABS: 650.0000 mg | ORAL_TABLET | Freq: Four times a day (QID) | ORAL | Status: DC | PRN

## 2020-05-09 MED ORDER — SODIUM CHLORIDE 0.9% IV SOLUTION: Freq: Once | INTRAVENOUS | Status: AC

## 2020-05-09 MED ORDER — HEPARIN (PORCINE) 25000 UT/250ML-% IV SOLN
1250.0000 [IU]/h | INTRAVENOUS | Status: AC
  Administered 2020-05-10: 1100 [IU]/h via INTRAVENOUS
  Administered 2020-05-11: 1250 [IU]/h via INTRAVENOUS
  Filled 2020-05-09 (×2): qty 250

## 2020-05-09 NOTE — Progress Notes (Signed)
ANTICOAGULATION CONSULT NOTE - Initial Consult  Pharmacy Consult:  Heparin Indication:  Recent DVT/PE  Allergies  Allergen Reactions  . Ibuprofen     REACTION: bleed    Patient Measurements: Height: 5\' 2"  (157.5 cm) Weight: 62.9 kg (138 lb 10.7 oz) IBW/kg (Calculated) : 50.1 Heparin Dosing Weight: 62 kg  Vital Signs: Temp: 98.5 F (36.9 C) (10/01 1553) Temp Source: Oral (10/01 1400) BP: 111/86 (10/01 1553) Pulse Rate: 100 (10/01 1553)  Labs: Recent Labs    05/07/20 0411 05/07/20 0411 05/08/20 0408 05/08/20 0408 05/08/20 4709 05/08/20 1629 05/08/20 1932 05/08/20 1932 05/09/20 0147 05/09/20 1543  HGB 10.5*   < > 9.9*   < > 8.7*   < > 7.6*   < > 7.3* 8.9*  HCT 33.1*   < > 31.1*   < > 27.5*   < > 24.0*  --  23.1* 27.7*  PLT 306  --  336  --   --   --   --   --   --   --   APTT  --   --  34  --   --   --   --   --   --   --   LABPROT  --   --  17.2*  --   --   --   --   --   --   --   INR  --   --  1.5*  --   --   --   --   --   --   --   CREATININE  --   --  0.50  --   --   --   --   --   --   --   TROPONINIHS  --   --   --   --  7  --   --   --   --   --    < > = values in this interval not displayed.    Estimated Creatinine Clearance: 40.7 mL/min (by C-G formula based on SCr of 0.5 mg/dL).   Medical History: Past Medical History:  Diagnosis Date  . Anemia   . Breast CA ([place])    left breast-invasive ductal ca stage 1-stopped arimidex 2008  . Diverticulitis of colon   . GI bleed 04/2020  . Hiatal hernia   . Hyperlipidemia   . Hypertension   . Hypothyroidism   . Osteoarthritis   . Osteopenia    DEXA 7/02  . Syncope 2005   CT head (-), ECHO essent. neg, stress test (-), carotid u/s (-)     Assessment: 89 YOF on Eliquis for a PE/DVT in September.  Patient presented with GIB and received TXA and KCentra.  Pharmacy consulted to start IV heparin on 10/2 AM.  Hemoglobin up to 8.9, platelet count WNL.  No overt bleeding documented.  Goal of Therapy:   Heparin level 0.3-0.7 units/ml Monitor platelets by anticoagulation protocol: Yes   Plan:   Check heparin level with AM lab to ensure Eliquis is cleared On 10/2 at 0800, start IV heparin at 1100 units/hr based on previous data - no bolus with bleeding risk Check 8 hr heparin level Daily heparin level and CBC  Myia Bergh D. Mina Marble, PharmD, BCPS, [place] 05/09/2020, 4:31 PM

## 2020-05-09 NOTE — Progress Notes (Signed)
[facility] PROGRESS NOTE    Ariel Medina  MBW:[id number] DOB: 06/07/1928 DOA: 05/08/2020 PCP: Colon Branch, MD      Brief Narrative:  Ariel Medina is a 86 y.o. F with HTN, hypothyroidism, remote BrCA, and then a recent admission for intussusception requiring right hemicolectomy 9/23 as well as DVT and PE.  Started on Eliquis during that admission, discharged 1 day prior to this admission, readmitted with rectal bleeding.  In the ER, patient's Hgb was 9.9 g/dL and she was actively bleeding.  She was given tranexamic acid only, and admitted.      Assessment & Plan:  Rectal bleeding, suspect anastomotic bleed Admitted to floor, Gen Surg consulted, given Kcentra, Eliquis held. -Consult Gen Surg, appreciate cares, no role for endoscopy at this time    Acute blood loss anemia This appears to be slowing. -Transfuse 1 unit now -Trend Hgb  Acute pulmonary embolism and bilateral DVT -Hold Eliquis for now -Trend Hgb and start heparin this afternoon if bleeding stable, if not will monitor overnight  Hypothyroidism -Continue levothyroxine  Polyneuropathy -Continue gabapentin  Hyperlipidemia -Continue Crestor  Hypertension -Hold HCT for now       Disposition: Status is: Observation  The patient will require care spanning > 2 midnights and should be moved to inpatient because: she has ongoing bleeding  HR 102 this morning, and requires another transfusion today.    Dispo: The patient is from:               Anticipated d/c is to: SNF              Anticipated d/c date is: 2 days              Patient currently is not medically stable to d/c.              MDM: The below labs and imaging reports were reviewed and summarized above.  Medication management as above.  Anticoagulation managed    DVT prophylaxis: SCDs  Code Status: FULL Family Communication:             Subjective: No fever, headache, rigidity, abdominal pain.   She had one red bloody bowel movement this morning.  No dizziness or passing out  Objective: Vitals:   05/09/20 1113 05/09/20 1255 05/09/20 1400 05/09/20 1553  BP:  129/75 127/77 111/86  Pulse:    100  Resp:  20 (!) 22 20  Temp:  98.4 F (36.9 C) 97.7 F (36.5 C) 98.5 F (36.9 C)  TempSrc:  Oral Oral   SpO2: 92% 97% 96% 97%  Weight:      Height:        Intake/Output Summary (Last 24 hours) at 05/09/2020 1635 Last data filed at 05/09/2020 0930 Gross per 24 hour  Intake 360 ml  Output --  Net 360 ml   Filed Weights   05/08/20 1404  Weight: 62.9 kg    Examination: General appearance: Elderly adult female, alert and in no obvious distress.   HEENT: Anicteric, conjunctiva pink, lids and lashes normal. No nasal deformity, discharge, epistaxis.  Lips moist.   Skin: Warm and dry.  No jaundice.  No suspicious rashes or lesions. Cardiac: Tachycardic, regular, nl S1-S2, no murmurs appreciated.  Capillary refill is brisk.  JVP not visible.  No LE edema.  Radial pulses 2+ and symmetric. Respiratory: Normal respiratory rate and rhythm.  CTAB without rales or wheezes. Abdomen: Abdomen soft.  No TTP or guarding. No ascites,  distension, hepatosplenomegaly.   MSK: No deformities or effusions. Neuro: Awake and alert.  EOMI, moves all extremities. Speech fluent.    Psych: Sensorium intact and responding to questions, attention normal. Affect normal.  Judgment and insight appear normal.    Data Reviewed: I have personally reviewed following labs and imaging studies:  CBC: Recent Labs  Lab 05/04/20 0301 05/04/20 0301 05/05/20 0327 05/05/20 0327 05/06/20 [id number] 05/06/20 6967 05/07/20 0411 05/07/20 0411 05/08/20 0408 05/08/20 0408 05/08/20 8938 05/08/20 1629 05/08/20 1932 05/09/20 0147 05/09/20 1543  WBC 8.5  --  8.0  --  10.1  --  8.6  --  8.8  --   --   --   --   --   --   HGB 11.6*   < > 10.9*   < > 10.7*   < > 10.5*   < > 9.9*   < > 8.7* 8.1* 7.6* 7.3* 8.9*  HCT 35.8*   < >  34.7*   < > 34.1*   < > 33.1*   < > 31.1*   < > 27.5* 25.5* 24.0* 23.1* 27.7*  MCV 95.5  --  96.7  --  96.9  --  95.7  --  96.9  --   --   --   --   --   --   PLT 262  --  288  --  304  --  306  --  336  --   --   --   --   --   --    < > = values in this interval not displayed.   Basic Metabolic Panel: Recent Labs  Lab 05/03/20 0141 05/04/20 0301 05/05/20 0327 05/08/20 0408 05/08/20 0812  NA 134* 137 139 139  --   K 4.1 3.8 4.1 4.1  --   CL 99 102 103 98  --   CO2 _0 34*  --   GLUCOSE 122* 118* 101* 106*  --   BUN <5* 7* <5* 7*  --   CREATININE 0.52 0.54 0.50 0.50  --   CALCIUM 8.1* 8.4* 8.5* 8.7*  --   MG 2.0 1.9 1.7  --  1.7  PHOS 4.1  --  2.7  --   --    GFR: Estimated Creatinine Clearance: 40.7 mL/min (by C-G formula based on SCr of 0.5 mg/dL). Liver Function Tests: Recent Labs  Lab 05/03/20 0141 05/05/20 0327 05/08/20 0408  AST 15 14* 18  ALT _1 ALKPHOS 33* 27* 31*  BILITOT 0.5 0.5 0.7  PROT 4.2* 3.9* 4.4*  ALBUMIN 2.0* 1.8* 2.1*   No results for input(s): LIPASE, AMYLASE in the last 168 hours. No results for input(s): AMMONIA in the last 168 hours. Coagulation Profile: Recent Labs  Lab 05/08/20 0408  INR 1.5*   Cardiac Enzymes: No results for input(s): CKTOTAL, CKMB, CKMBINDEX, TROPONINI in the last 168 hours. BNP (last 3 results) No results for input(s): PROBNP in the last 8760 hours. HbA1C: No results for input(s): HGBA1C in the last 72 hours. CBG: Recent Labs  Lab 05/04/20 1211  GLUCAP 155*   Lipid Profile: No results for input(s): CHOL, HDL, LDLCALC, TRIG, CHOLHDL, LDLDIRECT in the last 72 hours. Thyroid Function Tests: No results for input(s): TSH, T4TOTAL, FREET4, T3FREE, THYROIDAB in the last 72 hours. Anemia Panel: No results for input(s): VITAMINB12, FOLATE, FERRITIN, TIBC, IRON, RETICCTPCT in the last 72 hours. Urine analysis:    Component Value Date/Time   BILIRUBINUR  1 12/26/2014 1628   PROTEINUR 100 12/26/2014 1628    UROBILINOGEN 0.2 12/26/2014 1628   NITRITE Neg 12/26/2014 1628   LEUKOCYTESUR Trace 12/26/2014 1628   Sepsis Labs: _0 (procalcitonin:4,lacticacidven:4)  ) Recent Results (from the past 240 hour(s))  Surgical pcr screen     Status: None   Collection Time: 05/01/20  3:22 AM   Specimen: Nasal Mucosa; Nasal Swab  Result Value Ref Range Status   MRSA, PCR NEGATIVE NEGATIVE Final   Staphylococcus aureus NEGATIVE NEGATIVE Final    Comment: (NOTE) The Xpert SA Assay (FDA approved for NASAL specimens in patients 80 years of age and older), is one component of a comprehensive surveillance program. It is not intended to diagnose infection nor to guide or monitor treatment. Performed at [facility], [place] [address], [place], [place] [address]   SARS CORONAVIRUS 2 (TAT 6-24 HRS) Nasopharyngeal Nasopharyngeal Swab     Status: None   Collection Time: 05/06/20  9:18 AM   Specimen: Nasopharyngeal Swab  Result Value Ref Range Status   SARS Coronavirus 2 NEGATIVE NEGATIVE Final    Comment: (NOTE) SARS-CoV-2 target nucleic acids are NOT DETECTED.  The SARS-CoV-2 RNA is generally detectable in upper and lower respiratory specimens during the acute phase of infection. Negative results do not preclude SARS-CoV-2 infection, do not rule out co-infections with other pathogens, and should not be used as the sole basis for treatment or other patient management decisions. Negative results must be combined with clinical observations, patient history, and epidemiological information. The expected result is Negative.  Fact Sheet for Patients: SugarRoll.be  Fact Sheet for Healthcare Providers: https://www.woods-mathews.com/  This test is not yet approved or cleared by the [place] FDA and  has been authorized for detection and/or diagnosis of SARS-CoV-2 by FDA under an Emergency Use Authorization (EUA). This EUA will remain  in effect (meaning  this test can be used) for the duration of the COVID-19 declaration under Se ction 564(b)(1) of the Act, 21 U.S.C. section 360bbb-3(b)(1), unless the authorization is terminated or revoked sooner.  Performed at [facility], [place] [address], [place], [place] [address]   Respiratory Panel by RT PCR (Flu A&B, Covid) - Nasopharyngeal Swab     Status: None   Collection Time: 05/08/20  4:36 AM   Specimen: Nasopharyngeal Swab  Result Value Ref Range Status   SARS Coronavirus 2 by RT PCR NEGATIVE NEGATIVE Final    Comment: (NOTE) SARS-CoV-2 target nucleic acids are NOT DETECTED.  The SARS-CoV-2 RNA is generally detectable in upper respiratoy specimens during the acute phase of infection. The lowest concentration of SARS-CoV-2 viral copies this assay can detect is 131 copies/mL. A negative result does not preclude SARS-Cov-2 infection and should not be used as the sole basis for treatment or other patient management decisions. A negative result may occur with  improper specimen collection/handling, submission of specimen other than nasopharyngeal swab, presence of viral mutation(s) within the areas targeted by this assay, and inadequate number of viral copies (<131 copies/mL). A negative result must be combined with clinical observations, patient history, and epidemiological information. The expected result is Negative.  Fact Sheet for Patients:  PinkCheek.be  Fact Sheet for Healthcare Providers:  GravelBags.it  This test is no t yet approved or cleared by the [place] FDA and  has been authorized for detection and/or diagnosis of SARS-CoV-2 by FDA under an Emergency Use Authorization (EUA). This EUA will remain  in effect (meaning this test can be used)  for the duration of the COVID-19 declaration under Section 564(b)(1) of the Act, 21 U.S.C. section 360bbb-3(b)(1), unless the authorization is terminated or revoked  sooner.     Influenza A by PCR NEGATIVE NEGATIVE Final   Influenza B by PCR NEGATIVE NEGATIVE Final    Comment: (NOTE) The Xpert Xpress SARS-CoV-2/FLU/RSV assay is intended as an aid in  the diagnosis of influenza from Nasopharyngeal swab specimens and  should not be used as a sole basis for treatment. Nasal washings and  aspirates are unacceptable for Xpert Xpress SARS-CoV-2/FLU/RSV  testing.  Fact Sheet for Patients: PinkCheek.be  Fact Sheet for Healthcare Providers: GravelBags.it  This test is not yet approved or cleared by the [place] FDA and  has been authorized for detection and/or diagnosis of SARS-CoV-2 by  FDA under an Emergency Use Authorization (EUA). This EUA will remain  in effect (meaning this test can be used) for the duration of the  Covid-19 declaration under Section 564(b)(1) of the Act, 21  U.S.C. section 360bbb-3(b)(1), unless the authorization is  terminated or revoked. Performed at [facility], [place] [address] [place], [place] [address]          Radiology Studies: CT ABDOMEN PELVIS WO CONTRAST  Result Date: 05/08/2020 CLINICAL DATA:  Recent partial colectomy.  Gastrointestinal bleeding EXAM: CT ABDOMEN AND PELVIS WITHOUT CONTRAST TECHNIQUE: Multidetector CT imaging of the abdomen and pelvis was performed following the standard protocol without oral or IV contrast. COMPARISON:  April 29, 2020 FINDINGS: Lower chest: There are pleural effusions bilaterally. There is compressive atelectasis in the lung bases with suspected superimposed degree of pneumonia in the posterior left base. There is a sizable hiatal type hernia present. There is a suspected lipoma within the hiatal hernia measuring 7 x 7 mm. Hepatobiliary: No focal liver lesions are evident on this noncontrast enhanced study. The gallbladder wall is not appreciably thickened. There is no biliary duct dilatation. Pancreas: There is  a partially calcified apparent mass arising along the rightward aspect of the head of the pancreas eccentrically measuring 1.7 x 1.7 x 1.7 cm. No other pancreatic lesion evident. Spleen: No splenic lesions are appreciable. Adrenals/Urinary Tract: Adrenals bilaterally appear normal. There is a cyst arising from the posterior mid right kidney measuring 1.7 x 1.7 cm. There is a cyst lobulated cyst arising from the lower pole the left kidney laterally measuring 2.6 x 2.5 cm. There is no appreciable hydronephrosis on either side. There is no appreciable renal or ureteral calculus on either side. Urinary bladder is midline with wall thickness within normal limits. Stomach/Bowel: Postoperative changes noted in the transverse colon region proximally with patent anastomoses. No fluid collection or extraluminal air seen in this area of recent surgery. Slight soft tissue stranding in this area is likely of postoperative etiology. There are multiple sigmoid diverticula without demonstrable diverticulitis. There is no demonstrable bowel obstruction. The terminal ileum region appears normal. There is no demonstrable free air or portal venous air. Vascular/Lymphatic: No abdominal aortic aneurysm. There is aortic and iliac artery atherosclerosis. There is a left retroaortic renal vein, an anatomic variant. No adenopathy evident in the abdomen or pelvis. Reproductive: Uterus is anteverted.  There is no adnexal mass. Other: There is no appreciable periappendiceal region inflammation. No abscess or ascites is evident in the abdomen or pelvis. Musculoskeletal: Bones are osteoporotic. There is degenerative change in each hip joint. There is also degenerative change in the lumbar spine. No blastic or lytic bone lesions. There is no intramuscular or  abdominal wall lesion evident. IMPRESSION: 1. Status post removal of previous lesion in the colon near the hepatic flexure involving the proximal transverse colon. Anastomosis patent in this  area. Slight soft tissue stranding in this area is felt to be due to recent surgery. There is no evidence suggesting perforation or abscess. No fluid is seen surrounding this area of recent surgery. 2.  Sigmoid diverticula without diverticulitis. 3. Pleural effusions bilaterally. Compressive atelectasis in the lung bases with suspected focus of pneumonia in the posterior left base. 4. Sizable hiatal hernia. Small lipoma noted within the rightward aspect of this hiatal hernia. 5.  No bowel obstruction.  No abscess in the abdomen or pelvis. 6. 1.7 x 1.7 x 1.7 cm partially calcified mass arising eccentrically from the pancreatic head. Significance of this finding in this age group is uncertain. Particular attention this area on subsequent evaluations is warranted. Pancreas otherwise appears normal. 7.  Aortic Atherosclerosis (ICD10-I70.0). Comment: A site for gastrointestinal bleeding has not been established with this study. If bleeding continues, nuclear medicine gastrointestinal bleeding study may be helpful for further assessment and potential localization of bleeding site. Electronically Signed   By: Lowella Grip III M.D.   On: 05/08/2020 08:19   ECHOCARDIOGRAM COMPLETE  Result Date: 05/08/2020    ECHOCARDIOGRAM REPORT   Patient Name:   DAMANI RANDO Date of Exam: 05/08/2020 Medical Rec #:  [id number]       Height:       62.0 in Accession #:    [id number]      Weight:       138.0 lb Date of Birth:  1928/06/30        BSA:          1.633 m Patient Age:    37 years        BP:           128/74 mmHg Patient Gender: F               HR:           92 bpm. Exam Location:  Inpatient Procedure: 2D Echo, Cardiac Doppler and Color Doppler Indications:    R94.31 Abnormal EKG  History:        Patient has no prior history of Echocardiogram examinations.                 COPD, Signs/Symptoms:Syncope; Risk Factors:Hypertension,                 Dyslipidemia and Diabetes.  Sonographer:    Bernadene Person RDCS Referring Phys:  [id number] [place]  1. Septal motion is abnormal likely due to conduction abnormality. . Left ventricular ejection fraction, by estimation, is 60 to 65%. The left ventricle has normal function. The left ventricle has no regional wall motion abnormalities. There is mild concentric left ventricular hypertrophy. Left ventricular diastolic parameters are consistent with Grade I diastolic dysfunction (impaired relaxation).  2. Right ventricular systolic function is normal. The right ventricular size is normal. There is mildly elevated pulmonary artery systolic pressure.  3. The mitral valve is normal in structure. Trivial mitral valve regurgitation.  4. The aortic valve is tricuspid. There is mild calcification of the aortic valve. There is mild thickening of the aortic valve. Aortic valve regurgitation is trivial. Mild aortic valve sclerosis is present, with no evidence of aortic valve stenosis.  5. The inferior vena cava is normal in size with greater than 50% respiratory variability, suggesting right atrial pressure of  3 mmHg. Comparison(s): No prior Echocardiogram. FINDINGS  Left Ventricle: Septal motion is abnormal likely due to conduction abnormality. Left ventricular ejection fraction, by estimation, is 60 to 65%. The left ventricle has normal function. The left ventricle has no regional wall motion abnormalities. The left ventricular internal cavity size was normal in size. There is mild concentric left ventricular hypertrophy. Left ventricular diastolic parameters are consistent with Grade I diastolic dysfunction (impaired relaxation). Right Ventricle: The right ventricular size is normal. No increase in right ventricular wall thickness. Right ventricular systolic function is normal. There is mildly elevated pulmonary artery systolic pressure. The tricuspid regurgitant velocity is 3.10  m/s, and with an assumed right atrial pressure of 3 mmHg, the estimated right ventricular systolic pressure  is 56.3 mmHg. Left Atrium: Left atrial size was normal in size. Right Atrium: Right atrial size was normal in size. Pericardium: There is no evidence of pericardial effusion. Mitral Valve: The mitral valve is normal in structure. There is mild thickening of the mitral valve leaflet(s). Mild mitral annular calcification. Trivial mitral valve regurgitation. Tricuspid Valve: The tricuspid valve is normal in structure. Tricuspid valve regurgitation is mild. Aortic Valve: The aortic valve is tricuspid. There is mild calcification of the aortic valve. There is mild thickening of the aortic valve. Aortic valve regurgitation is trivial. Mild aortic valve sclerosis is present, with no evidence of aortic valve stenosis. Pulmonic Valve: The pulmonic valve was normal in structure. Pulmonic valve regurgitation is not visualized. Aorta: The aortic root and ascending aorta are structurally normal, with no evidence of dilitation. Venous: The inferior vena cava is normal in size with greater than 50% respiratory variability, suggesting right atrial pressure of 3 mmHg. IAS/Shunts: No atrial level shunt detected by color flow Doppler.  LEFT VENTRICLE PLAX 2D LVIDd:         3.00 cm  Diastology LVIDs:         2.10 cm  LV e' medial:    4.68 cm/s LV PW:         1.20 cm  LV E/e' medial:  15.3 LV IVS:        1.20 cm  LV e' lateral:   5.22 cm/s LVOT diam:     1.80 cm  LV E/e' lateral: 13.7 LV SV:         74 LV SV Index:   46 LVOT Area:     2.54 cm  RIGHT VENTRICLE RV S prime:     11.80 cm/s TAPSE (M-mode): 1.9 cm LEFT ATRIUM           Index       RIGHT ATRIUM           Index LA diam:      1.90 cm 1.16 cm/m  RA Area:     12.70 cm LA Vol (A2C): 28.4 ml 17.39 ml/m RA Volume:   31.10 ml  19.05 ml/m LA Vol (A4C): 25.2 ml 15.43 ml/m  AORTIC VALVE LVOT Vmax:   156.00 cm/s LVOT Vmean:  113.000 cm/s LVOT VTI:    0.292 m  AORTA Ao Root diam: 2.90 cm Ao Asc diam:  3.00 cm MITRAL VALVE                TRICUSPID VALVE MV Area (PHT): 4.83 cm     TR  Peak grad:   38.4 mmHg MV Decel Time: 157 msec     TR Vmax:        310.00 cm/s MV E velocity: 71.65 cm/s MV  A velocity: 148.00 cm/s  SHUNTS MV E/A ratio:  0.48         Systemic VTI:  0.29 m                             Systemic Diam: 1.80 cm Gwyndolyn Kaufman MD Electronically signed by Gwyndolyn Kaufman MD Signature Date/Time: 05/08/2020/2:25:58 PM    Final         Scheduled Meds: . feeding supplement (ENSURE ENLIVE)  237 mL Oral BID BM  . gabapentin  300 mg Oral BID  . levothyroxine  75 mcg Oral QAC breakfast  . psyllium  1 packet Oral Daily  . rosuvastatin  5 mg Oral Once per day on Mon Wed Fri   Continuous Infusions:   LOS: 0 days    Time spent: 35 minutes    Edwin Dada, MD [facility] 05/09/2020, 4:35 PM     Please page though [place] or Epic secure chat:  For [employer], Adult nurse

## 2020-05-09 NOTE — Progress Notes (Signed)
Initial Nutrition Assessment  DOCUMENTATION CODES:   Not applicable  INTERVENTION:    Ensure Enlive po BID, each supplement provides 350 kcal and 20 grams of protein  MVI daily   NUTRITION DIAGNOSIS:   Increased nutrient needs related to post-op healing as evidenced by estimated needs.  GOAL:   Patient will meet greater than or equal to 90% of their needs  MONITOR:   PO intake, Supplement acceptance, Weight trends, Labs, I & O's, Skin  REASON FOR ASSESSMENT:   Malnutrition Screening Tool    ASSESSMENT:   Patient with PMH significant for breast cancer, diverticulitis, HTN, HLD, and hypothyroidism. Recently admitted for intussusception with mild rectal bleeding s/p R hemicolectomy on 9/23. Presents this admission with acute lower GI bleed concerning for anastomotic bleed in setting of anticoagulation.   Pt endorses a great appetite prior to surgery on 9/23. States after surgery she has only been able to eat 3-4 smaller meals/snacks daily. Unable to elaborate on meal composition. Appetite is progressing slowly this admission. Consumed 25% of breakfast this am and is just now eating lunch. She is drinking Ensure BID and would like to continue.   Pt unsure of UBW and denies recent wt loss. Records indicate pt has maintained her weight.   Medications: metamucil Labs: CBG 101-106  Diet Order:   Diet Order            DIET SOFT Room service appropriate? Yes; Fluid consistency: Thin  Diet effective now                 EDUCATION NEEDS:   Not appropriate for education at this time  Skin:  Skin Assessment: Skin Integrity Issues: Skin Integrity Issues:: Incisions Incisions: abdomen  Last BM:  10/1  Height:   Ht Readings from Last 1 Encounters:  05/08/20 5\' 2"  (1.575 m)    Weight:   Wt Readings from Last 1 Encounters:  05/08/20 62.9 kg   BMI:  Body mass index is 25.36 kg/m.  Estimated Nutritional Needs:   Kcal:  1650-1850 kcal  Protein:  80-95  grams  Fluid:  >/= 1.6 L/day  Mariana Single RD, LDN Clinical Nutrition Pager listed in [place]

## 2020-05-09 NOTE — Progress Notes (Signed)
Subjective/Chief Complaint: No bloody bms overnight, feels ok   Objective: Vital signs in last 24 hours: Temp:  [98.5 F (36.9 C)-98.8 F (37.1 C)] 98.5 F (36.9 C) (09/30 1951) Pulse Rate:  [95-99] 99 (09/30 1951) Resp:  [17] 17 (09/30 1403) BP: (99-118)/(69-75) 118/69 (09/30 1951) SpO2:  [96 %-99 %] 96 % (09/30 1951) Weight:  [62.9 kg] 62.9 kg (09/30 1404) Last BM Date: 05/08/20  Intake/Output from previous day: 09/30 0701 - 10/01 0700 In: 1085 [P.O.:240; I.V.:725; IV Piggyback:120] Out: -  Intake/Output this shift: No intake/output data recorded.  GI: soft bs present nontender incision clean  Lab Results:  Recent Labs    05/07/20 0411 05/07/20 0411 05/08/20 0408 05/08/20 0812 05/08/20 1932 05/09/20 0147  WBC 8.6  --  8.8  --   --   --   HGB 10.5*   < > 9.9*   < > 7.6* 7.3*  HCT 33.1*   < > 31.1*   < > 24.0* 23.1*  PLT 306  --  336  --   --   --    < > = values in this interval not displayed.   BMET Recent Labs    05/08/20 0408  NA 139  K 4.1  CL 98  CO2 34*  GLUCOSE 106*  BUN 7*  CREATININE 0.50  CALCIUM 8.7*   PT/INR Recent Labs    05/08/20 0408  LABPROT 17.2*  INR 1.5*   ABG No results for input(s): PHART, HCO3 in the last 72 hours.  Invalid input(s): PCO2, PO2  Studies/Results: CT ABDOMEN PELVIS WO CONTRAST  Result Date: 05/08/2020 CLINICAL DATA:  Recent partial colectomy.  Gastrointestinal bleeding EXAM: CT ABDOMEN AND PELVIS WITHOUT CONTRAST TECHNIQUE: Multidetector CT imaging of the abdomen and pelvis was performed following the standard protocol without oral or IV contrast. COMPARISON:  April 29, 2020 FINDINGS: Lower chest: There are pleural effusions bilaterally. There is compressive atelectasis in the lung bases with suspected superimposed degree of pneumonia in the posterior left base. There is a sizable hiatal type hernia present. There is a suspected lipoma within the hiatal hernia measuring 7 x 7 mm. Hepatobiliary: No  focal liver lesions are evident on this noncontrast enhanced study. The gallbladder wall is not appreciably thickened. There is no biliary duct dilatation. Pancreas: There is a partially calcified apparent mass arising along the rightward aspect of the head of the pancreas eccentrically measuring 1.7 x 1.7 x 1.7 cm. No other pancreatic lesion evident. Spleen: No splenic lesions are appreciable. Adrenals/Urinary Tract: Adrenals bilaterally appear normal. There is a cyst arising from the posterior mid right kidney measuring 1.7 x 1.7 cm. There is a cyst lobulated cyst arising from the lower pole the left kidney laterally measuring 2.6 x 2.5 cm. There is no appreciable hydronephrosis on either side. There is no appreciable renal or ureteral calculus on either side. Urinary bladder is midline with wall thickness within normal limits. Stomach/Bowel: Postoperative changes noted in the transverse colon region proximally with patent anastomoses. No fluid collection or extraluminal air seen in this area of recent surgery. Slight soft tissue stranding in this area is likely of postoperative etiology. There are multiple sigmoid diverticula without demonstrable diverticulitis. There is no demonstrable bowel obstruction. The terminal ileum region appears normal. There is no demonstrable free air or portal venous air. Vascular/Lymphatic: No abdominal aortic aneurysm. There is aortic and iliac artery atherosclerosis. There is a left retroaortic renal vein, an anatomic variant. No adenopathy evident in the abdomen or  pelvis. Reproductive: Uterus is anteverted.  There is no adnexal mass. Other: There is no appreciable periappendiceal region inflammation. No abscess or ascites is evident in the abdomen or pelvis. Musculoskeletal: Bones are osteoporotic. There is degenerative change in each hip joint. There is also degenerative change in the lumbar spine. No blastic or lytic bone lesions. There is no intramuscular or abdominal wall  lesion evident. IMPRESSION: 1. Status post removal of previous lesion in the colon near the hepatic flexure involving the proximal transverse colon. Anastomosis patent in this area. Slight soft tissue stranding in this area is felt to be due to recent surgery. There is no evidence suggesting perforation or abscess. No fluid is seen surrounding this area of recent surgery. 2.  Sigmoid diverticula without diverticulitis. 3. Pleural effusions bilaterally. Compressive atelectasis in the lung bases with suspected focus of pneumonia in the posterior left base. 4. Sizable hiatal hernia. Small lipoma noted within the rightward aspect of this hiatal hernia. 5.  No bowel obstruction.  No abscess in the abdomen or pelvis. 6. 1.7 x 1.7 x 1.7 cm partially calcified mass arising eccentrically from the pancreatic head. Significance of this finding in this age group is uncertain. Particular attention this area on subsequent evaluations is warranted. Pancreas otherwise appears normal. 7.  Aortic Atherosclerosis (ICD10-I70.0). Comment: A site for gastrointestinal bleeding has not been established with this study. If bleeding continues, nuclear medicine gastrointestinal bleeding study may be helpful for further assessment and potential localization of bleeding site. Electronically Signed   By: Lowella Grip III M.D.   On: 05/08/2020 08:19   ECHOCARDIOGRAM COMPLETE  Result Date: 05/08/2020    ECHOCARDIOGRAM REPORT   Patient Name:   Ariel Medina Date of Exam: 05/08/2020 Medical Rec #:  [id number]       Height:       62.0 in Accession #:    [id number]      Weight:       138.0 lb Date of Birth:  February 19, 1928        BSA:          1.633 m Patient Age:    86 years        BP:           128/74 mmHg Patient Gender: F               HR:           92 bpm. Exam Location:  Inpatient Procedure: 2D Echo, Cardiac Doppler and Color Doppler Indications:    R94.31 Abnormal EKG  History:        Patient has no prior history of Echocardiogram  examinations.                 COPD, Signs/Symptoms:Syncope; Risk Factors:Hypertension,                 Dyslipidemia and Diabetes.  Sonographer:    Bernadene Person RDCS Referring Phys: [id number] [place]  1. Septal motion is abnormal likely due to conduction abnormality. . Left ventricular ejection fraction, by estimation, is 60 to 65%. The left ventricle has normal function. The left ventricle has no regional wall motion abnormalities. There is mild concentric left ventricular hypertrophy. Left ventricular diastolic parameters are consistent with Grade I diastolic dysfunction (impaired relaxation).  2. Right ventricular systolic function is normal. The right ventricular size is normal. There is mildly elevated pulmonary artery systolic pressure.  3. The mitral valve is normal in structure. Trivial mitral valve regurgitation.  4. The aortic valve is tricuspid. There is mild calcification of the aortic valve. There is mild thickening of the aortic valve. Aortic valve regurgitation is trivial. Mild aortic valve sclerosis is present, with no evidence of aortic valve stenosis.  5. The inferior vena cava is normal in size with greater than 50% respiratory variability, suggesting right atrial pressure of 3 mmHg. Comparison(s): No prior Echocardiogram. FINDINGS  Left Ventricle: Septal motion is abnormal likely due to conduction abnormality. Left ventricular ejection fraction, by estimation, is 60 to 65%. The left ventricle has normal function. The left ventricle has no regional wall motion abnormalities. The left ventricular internal cavity size was normal in size. There is mild concentric left ventricular hypertrophy. Left ventricular diastolic parameters are consistent with Grade I diastolic dysfunction (impaired relaxation). Right Ventricle: The right ventricular size is normal. No increase in right ventricular wall thickness. Right ventricular systolic function is normal. There is mildly elevated  pulmonary artery systolic pressure. The tricuspid regurgitant velocity is 3.10  m/s, and with an assumed right atrial pressure of 3 mmHg, the estimated right ventricular systolic pressure is 17.4 mmHg. Left Atrium: Left atrial size was normal in size. Right Atrium: Right atrial size was normal in size. Pericardium: There is no evidence of pericardial effusion. Mitral Valve: The mitral valve is normal in structure. There is mild thickening of the mitral valve leaflet(s). Mild mitral annular calcification. Trivial mitral valve regurgitation. Tricuspid Valve: The tricuspid valve is normal in structure. Tricuspid valve regurgitation is mild. Aortic Valve: The aortic valve is tricuspid. There is mild calcification of the aortic valve. There is mild thickening of the aortic valve. Aortic valve regurgitation is trivial. Mild aortic valve sclerosis is present, with no evidence of aortic valve stenosis. Pulmonic Valve: The pulmonic valve was normal in structure. Pulmonic valve regurgitation is not visualized. Aorta: The aortic root and ascending aorta are structurally normal, with no evidence of dilitation. Venous: The inferior vena cava is normal in size with greater than 50% respiratory variability, suggesting right atrial pressure of 3 mmHg. IAS/Shunts: No atrial level shunt detected by color flow Doppler.  LEFT VENTRICLE PLAX 2D LVIDd:         3.00 cm  Diastology LVIDs:         2.10 cm  LV e' medial:    4.68 cm/s LV PW:         1.20 cm  LV E/e' medial:  15.3 LV IVS:        1.20 cm  LV e' lateral:   5.22 cm/s LVOT diam:     1.80 cm  LV E/e' lateral: 13.7 LV SV:         74 LV SV Index:   46 LVOT Area:     2.54 cm  RIGHT VENTRICLE RV S prime:     11.80 cm/s TAPSE (M-mode): 1.9 cm LEFT ATRIUM           Index       RIGHT ATRIUM           Index LA diam:      1.90 cm 1.16 cm/m  RA Area:     12.70 cm LA Vol (A2C): 28.4 ml 17.39 ml/m RA Volume:   31.10 ml  19.05 ml/m LA Vol (A4C): 25.2 ml 15.43 ml/m  AORTIC VALVE LVOT Vmax:    156.00 cm/s LVOT Vmean:  113.000 cm/s LVOT VTI:    0.292 m  AORTA Ao Root diam: 2.90 cm Ao Asc diam:  3.00 cm  MITRAL VALVE                TRICUSPID VALVE MV Area (PHT): 4.83 cm     TR Peak grad:   38.4 mmHg MV Decel Time: 157 msec     TR Vmax:        310.00 cm/s MV E velocity: 71.65 cm/s MV A velocity: 148.00 cm/s  SHUNTS MV E/A ratio:  0.48         Systemic VTI:  0.29 m                             Systemic Diam: 1.80 cm Gwyndolyn Kaufman MD Electronically signed by Gwyndolyn Kaufman MD Signature Date/Time: 05/08/2020/2:25:58 PM    Final     Anti-infectives: Anti-infectives (From admission, onward)   None      Assessment/Plan: Bilateral PE with hypoxia- on supplemental O2 LUL mass on CT- PNA vs neoplasm, repeat CT in 3-4 weeks recommended  Intussusception  POD 8 S/p open extended R hemicolectomy with stapled anastomosis 05/01/20 Dr. Ninfa Linden  - surgical path showed submucosal lipoma, no malignancy - hgb was 9.9 on admit from 10.5 day of dc, 7.3 this AM would like to tx one unit and if stable later will start iv heparin due to bilateral PE - serial h/h - soft diet - no indication for surgical or GI intervention acutely but we will follow and consult GI if needed     Rolm Bookbinder 05/09/2020

## 2020-05-10 LAB — TYPE AND SCREEN
ABO/RH(D): A POS
Antibody Screen: NEGATIVE
Unit division: 0

## 2020-05-10 LAB — CBC
HCT: 26.4 % — ABNORMAL LOW (ref 36.0–46.0)
Hemoglobin: 8.6 g/dL — ABNORMAL LOW (ref 12.0–15.0)
MCH: 31 pg (ref 26.0–34.0)
MCHC: 32.6 g/dL (ref 30.0–36.0)
MCV: 95.3 fL (ref 80.0–100.0)
Platelets: 291 10*3/uL (ref 150–400)
RBC: 2.77 MIL/uL — ABNORMAL LOW (ref 3.87–5.11)
RDW: 14.8 % (ref 11.5–15.5)
WBC: 7.4 10*3/uL (ref 4.0–10.5)
nRBC: 0 % (ref 0.0–0.2)

## 2020-05-10 LAB — BPAM RBC
Blood Product Expiration Date: 202110282359
ISSUE DATE / TIME: 202110011040
Unit Type and Rh: 6200

## 2020-05-10 LAB — HEPARIN LEVEL (UNFRACTIONATED)
Heparin Unfractionated: 0.13 IU/mL — ABNORMAL LOW (ref 0.30–0.70)
Heparin Unfractionated: 0.1 IU/mL — ABNORMAL LOW (ref 0.30–0.70)

## 2020-05-10 LAB — APTT: aPTT: 34 seconds (ref 24–36)

## 2020-05-10 NOTE — Progress Notes (Signed)
ANTICOAGULATION CONSULT NOTE - Follow Up Consult  Pharmacy Consult:  Heparin Indication:  Recent DVT/PE  Allergies  Allergen Reactions  . Ibuprofen     REACTION: bleed    Patient Measurements: Height: 5\' 2"  (157.5 cm) Weight: 62.9 kg (138 lb 10.7 oz) IBW/kg (Calculated) : 50.1 Heparin Dosing Weight: 62 kg  Vital Signs: Temp: 98.3 F (36.8 C) (10/02 1645) BP: 118/71 (10/02 1645) Pulse Rate: 88 (10/02 1645)  Labs: Recent Labs    05/08/20 0408 05/08/20 0408 05/08/20 8938 05/08/20 1629 05/09/20 0147 05/09/20 0147 05/09/20 1543 05/10/20 0128 05/10/20 1536  HGB 9.9*   < > 8.7*   < > 7.3*   < > 8.9* 8.6*  --   HCT 31.1*   < > 27.5*   < > 23.1*  --  27.7* 26.4*  --   PLT 336  --   --   --   --   --   --  291  --   APTT 34  --   --   --   --   --   --   --  34  LABPROT 17.2*  --   --   --   --   --   --   --   --   INR 1.5*  --   --   --   --   --   --   --   --   HEPARINUNFRC  --   --   --   --   --   --   --  0.13* 0.10*  CREATININE 0.50  --   --   --   --   --   --   --   --   TROPONINIHS  --   --  7  --   --   --   --   --   --    < > = values in this interval not displayed.    Estimated Creatinine Clearance: 40.7 mL/min (by C-G formula based on SCr of 0.5 mg/dL).   Medical History: Past Medical History:  Diagnosis Date  . Anemia   . Breast CA ([place])    left breast-invasive ductal ca stage 1-stopped arimidex 2008  . Diverticulitis of colon   . GI bleed 04/2020  . Hiatal hernia   . Hyperlipidemia   . Hypertension   . Hypothyroidism   . Osteoarthritis   . Osteopenia    DEXA 7/02  . Syncope 2005   CT head (-), ECHO essent. neg, stress test (-), carotid u/s (-)     Assessment: 89 YOF on Eliquis for a PE/DVT in September.  Patient presented with GIB and received TXA and KCentra.  Pharmacy consulted to start IV heparin on 10/2 AM.    Currently on IV heparin at 1100 units/hr. Initial HL is subtherapeutic at 0.1 which correlates to an aPTT of 34. No overt  s/s of bleeding per RN.   Goal of Therapy:  Heparin level 0.3-0.7 units/ml Monitor platelets by anticoagulation protocol: Yes   Plan:   -Increase IV heparin to 1250 units/hr  Check 8 hr heparin level Daily heparin level and CBC  Albertina Parr, PharmD., BCPS, BCCCP Clinical Pharmacist Clinical phone for 05/10/20 until 10pm: [phone] If after 10pm, please refer to [facility] for unit-specific pharmacist

## 2020-05-10 NOTE — Progress Notes (Signed)
Subjective/Chief Complaint: No more bms overnight, doing well this am   Objective: Vital signs in last 24 hours: Temp:  [97.7 F (36.5 C)-98.6 F (37 C)] 97.9 F (36.6 C) (10/02 0809) Pulse Rate:  [83-106] 83 (10/02 0809) Resp:  [18-24] 18 (10/02 0809) BP: (98-134)/(62-86) 134/66 (10/02 0809) SpO2:  [88 %-99 %] 99 % (10/02 0809) Last BM Date: 05/09/20  Intake/Output from previous day: 10/01 0701 - 10/02 0700 In: 360 [P.O.:360] Out: -  Intake/Output this shift: No intake/output data recorded.  GI soft nontender incision clean bs present  Lab Results:  Recent Labs    05/08/20 0408 05/08/20 0812 05/09/20 1543 05/10/20 0128  WBC 8.8  --   --  7.4  HGB 9.9*   < > 8.9* 8.6*  HCT 31.1*   < > 27.7* 26.4*  PLT 336  --   --  291   < > = values in this interval not displayed.   BMET Recent Labs    05/08/20 0408  NA 139  K 4.1  CL 98  CO2 34*  GLUCOSE 106*  BUN 7*  CREATININE 0.50  CALCIUM 8.7*   PT/INR Recent Labs    05/08/20 0408  LABPROT 17.2*  INR 1.5*   ABG No results for input(s): PHART, HCO3 in the last 72 hours.  Invalid input(s): PCO2, PO2  Studies/Results: ECHOCARDIOGRAM COMPLETE  Result Date: 05/08/2020    ECHOCARDIOGRAM REPORT   Patient Name:   Ariel Medina Date of Exam: 05/08/2020 Medical Rec #:  [id number]       Height:       62.0 in Accession #:    [id number]      Weight:       138.0 lb Date of Birth:  1927-12-27        BSA:          1.633 m Patient Age:    86 years        BP:           128/74 mmHg Patient Gender: F               HR:           92 bpm. Exam Location:  Inpatient Procedure: 2D Echo, Cardiac Doppler and Color Doppler Indications:    R94.31 Abnormal EKG  History:        Patient has no prior history of Echocardiogram examinations.                 COPD, Signs/Symptoms:Syncope; Risk Factors:Hypertension,                 Dyslipidemia and Diabetes.  Sonographer:    Bernadene Person RDCS Referring Phys: [id number] [place]  1. Septal motion is abnormal likely due to conduction abnormality. . Left ventricular ejection fraction, by estimation, is 60 to 65%. The left ventricle has normal function. The left ventricle has no regional wall motion abnormalities. There is mild concentric left ventricular hypertrophy. Left ventricular diastolic parameters are consistent with Grade I diastolic dysfunction (impaired relaxation).  2. Right ventricular systolic function is normal. The right ventricular size is normal. There is mildly elevated pulmonary artery systolic pressure.  3. The mitral valve is normal in structure. Trivial mitral valve regurgitation.  4. The aortic valve is tricuspid. There is mild calcification of the aortic valve. There is mild thickening of the aortic valve. Aortic valve regurgitation is trivial. Mild aortic valve sclerosis is present, with no evidence of aortic  valve stenosis.  5. The inferior vena cava is normal in size with greater than 50% respiratory variability, suggesting right atrial pressure of 3 mmHg. Comparison(s): No prior Echocardiogram. FINDINGS  Left Ventricle: Septal motion is abnormal likely due to conduction abnormality. Left ventricular ejection fraction, by estimation, is 60 to 65%. The left ventricle has normal function. The left ventricle has no regional wall motion abnormalities. The left ventricular internal cavity size was normal in size. There is mild concentric left ventricular hypertrophy. Left ventricular diastolic parameters are consistent with Grade I diastolic dysfunction (impaired relaxation). Right Ventricle: The right ventricular size is normal. No increase in right ventricular wall thickness. Right ventricular systolic function is normal. There is mildly elevated pulmonary artery systolic pressure. The tricuspid regurgitant velocity is 3.10  m/s, and with an assumed right atrial pressure of 3 mmHg, the estimated right ventricular systolic pressure is 29.7 mmHg. Left Atrium:  Left atrial size was normal in size. Right Atrium: Right atrial size was normal in size. Pericardium: There is no evidence of pericardial effusion. Mitral Valve: The mitral valve is normal in structure. There is mild thickening of the mitral valve leaflet(s). Mild mitral annular calcification. Trivial mitral valve regurgitation. Tricuspid Valve: The tricuspid valve is normal in structure. Tricuspid valve regurgitation is mild. Aortic Valve: The aortic valve is tricuspid. There is mild calcification of the aortic valve. There is mild thickening of the aortic valve. Aortic valve regurgitation is trivial. Mild aortic valve sclerosis is present, with no evidence of aortic valve stenosis. Pulmonic Valve: The pulmonic valve was normal in structure. Pulmonic valve regurgitation is not visualized. Aorta: The aortic root and ascending aorta are structurally normal, with no evidence of dilitation. Venous: The inferior vena cava is normal in size with greater than 50% respiratory variability, suggesting right atrial pressure of 3 mmHg. IAS/Shunts: No atrial level shunt detected by color flow Doppler.  LEFT VENTRICLE PLAX 2D LVIDd:         3.00 cm  Diastology LVIDs:         2.10 cm  LV e' medial:    4.68 cm/s LV PW:         1.20 cm  LV E/e' medial:  15.3 LV IVS:        1.20 cm  LV e' lateral:   5.22 cm/s LVOT diam:     1.80 cm  LV E/e' lateral: 13.7 LV SV:         74 LV SV Index:   46 LVOT Area:     2.54 cm  RIGHT VENTRICLE RV S prime:     11.80 cm/s TAPSE (M-mode): 1.9 cm LEFT ATRIUM           Index       RIGHT ATRIUM           Index LA diam:      1.90 cm 1.16 cm/m  RA Area:     12.70 cm LA Vol (A2C): 28.4 ml 17.39 ml/m RA Volume:   31.10 ml  19.05 ml/m LA Vol (A4C): 25.2 ml 15.43 ml/m  AORTIC VALVE LVOT Vmax:   156.00 cm/s LVOT Vmean:  113.000 cm/s LVOT VTI:    0.292 m  AORTA Ao Root diam: 2.90 cm Ao Asc diam:  3.00 cm MITRAL VALVE                TRICUSPID VALVE MV Area (PHT): 4.83 cm     TR Peak grad:   38.4 mmHg MV  Decel Time:  157 msec     TR Vmax:        310.00 cm/s MV E velocity: 71.65 cm/s MV A velocity: 148.00 cm/s  SHUNTS MV E/A ratio:  0.48         Systemic VTI:  0.29 m                             Systemic Diam: 1.80 cm Gwyndolyn Kaufman MD Electronically signed by Gwyndolyn Kaufman MD Signature Date/Time: 05/08/2020/2:25:58 PM    Final     Anti-infectives: Anti-infectives (From admission, onward)   None      Assessment/Plan:  POD 9 S/p open extended R hemicolectomy with stapled anastomosis 05/01/20 Dr. Ninfa Linden  - surgical path showed submucosal lipoma, no malignancy - I think stable now, would be ok with starting heparin this am, recheck hct later today, if does well for 24 hours can transition back to [place] - serial h/h - soft diet - no indication for surgical or GI intervention acutely but we will follow and consult GI if needed Bilateral PE with hypoxia- on supplemental O2 LUL mass on CT- PNA vs neoplasm, repeat CT in 3-4 weeks recommended    Rolm Bookbinder 05/10/2020

## 2020-05-10 NOTE — Progress Notes (Signed)
[facility] PROGRESS NOTE    Ariel Medina  DZH:[id number] DOB: [birth year] DOA: 05/08/2020 PCP: Colon Branch, MD      Brief Narrative:  [name] is a 86 y.o. F with HTN, hypothyroidism, remote BrCA, and then a recent admission for intussusception requiring right hemicolectomy 9/23 as well as DVT and PE.  Started on Eliquis during that admission, discharged 1 day prior to this admission, readmitted with rectal bleeding.  In the ER, patient's Hgb was 9.9 g/dL and she was actively bleeding.  She was given tranexamic acid only, and admitted.      Assessment & Plan:  Acute GI bleed due to suspected anastomotic bleeding Admitted to floor, Gen Surg consulted, given Kcentra, Eliquis held.  Since then, some red BM yesterday, no BM in last 24 hours.  Hgb stable today -Cautiously resume heparin -Consult Gen Surg, appreciate cares, no role for endoscopy at this time -Soft diet    Acute blood loss anemia Hemoglobin stable, transfused 1 unit yesterday  -Trend Hgb -Transfusion threshold 7 g/dL  Acute pulmonary embolism and bilateral DVT -Hold Eliquis -Start heparin  Hypothyroidism -Continue levothyroxine  Polyneuropathy -Continue gabapentin  Hyperlipidemia -Continue Crestor  Hypertension Blood pressure normal -Hold hydrochlorothiazide        Disposition: Status is: Inpatient   The patient has had rectal bleeding within the last 24 hours, and also transfusion.  We will need to continue IV heparin in order to have the ability to stop anticoagulation rapidly, therefore patient requires ongoing inpatient cares   Dispo: The patient is from: SNF              Anticipated d/c is to: SNF              Anticipated d/c date is: 2 days              Patient currently is not medically stable to d/c.              MDM: The below labs and imaging reports were reviewed and summarized above.  Medication management as above.  Anticoagulation  managed    DVT prophylaxis: SCDs  Code Status: FULL Family Communication:             Subjective: Red BM yesterday, no BM since.  No fever, confusion, change in mentation.  Abdomen soft.  Objective: Vitals:   05/09/20 1400 05/09/20 1553 05/09/20 2133 05/10/20 0809  BP: 127/77 111/86 114/62 134/66  Pulse:  100 92 83  Resp: (!) 22 20 20 18   Temp: 97.7 F (36.5 C) 98.5 F (36.9 C) 98.2 F (36.8 C) 97.9 F (36.6 C)  TempSrc: Oral  Oral   SpO2: 96% 97% 98% 99%  Weight:      Height:        Intake/Output Summary (Last 24 hours) at 05/10/2020 1627 Last data filed at 05/10/2020 4196 Gross per 24 hour  Intake 237 ml  Output --  Net 237 ml   Filed Weights   05/08/20 1404  Weight: 62.9 kg    Examination: General appearance: Elderly adult female, lying in bed, no acute distress.   HEENT: Anicteric, conjunctiva pink, lids and lashes normal. No nasal deformity, discharge, epistaxis.  Lips moist.   Skin: Skin warm and dry, no suspicious rashes or lesions Cardiac: Tachycardic, regular, no murmurs, JVP normal Respiratory: Normal respiratory rate and rhythm, lungs clear without rales or wheeze Abdomen: Abdomen soft, no tenderness palpation, mild voluntary guarding.  No distention or ascites.  MSK: No deformities or effusions. Neuro: Awake and alert.  EOMI, moves all extremities. Speech fluent.    Psych: Sensorium intact and responding to questions, attention normal. Affect normal.  Judgment and insight appear normal.    Data Reviewed: I have personally reviewed following labs and imaging studies:  CBC: Recent Labs  Lab 05/05/20 0327 05/05/20 0327 05/06/20 0318 05/06/20 1093 05/07/20 0411 05/07/20 0411 05/08/20 0408 05/08/20 2355 05/08/20 1629 05/08/20 1932 05/09/20 0147 05/09/20 1543 05/10/20 0128  WBC 8.0  --  10.1  --  8.6  --  8.8  --   --   --   --   --  7.4  HGB 10.9*   < > 10.7*   < > 10.5*   < > 9.9*   < > 8.1* 7.6* 7.3* 8.9* 8.6*  HCT 34.7*   < >  34.1*   < > 33.1*   < > 31.1*   < > 25.5* 24.0* 23.1* 27.7* 26.4*  MCV 96.7  --  96.9  --  95.7  --  96.9  --   --   --   --   --  95.3  PLT 288  --  304  --  306  --  336  --   --   --   --   --  291   < > = values in this interval not displayed.   Basic Metabolic Panel: Recent Labs  Lab 05/04/20 0301 05/05/20 0327 05/08/20 0408 05/08/20 0812  NA 137 139 139  --   K 3.8 4.1 4.1  --   CL 102 103 98  --   CO2 28 30 34*  --   GLUCOSE 118* 101* 106*  --   BUN 7* <5* 7*  --   CREATININE 0.54 0.50 0.50  --   CALCIUM 8.4* 8.5* 8.7*  --   MG 1.9 1.7  --  1.7  PHOS  --  2.7  --   --    GFR: Estimated Creatinine Clearance: 40.7 mL/min (by C-G formula based on SCr of 0.5 mg/dL). Liver Function Tests: Recent Labs  Lab 05/05/20 0327 05/08/20 0408  AST 14* 18  ALT 8 11  ALKPHOS 27* 31*  BILITOT 0.5 0.7  PROT 3.9* 4.4*  ALBUMIN 1.8* 2.1*   No results for input(s): LIPASE, AMYLASE in the last 168 hours. No results for input(s): AMMONIA in the last 168 hours. Coagulation Profile: Recent Labs  Lab 05/08/20 0408  INR 1.5*   Cardiac Enzymes: No results for input(s): CKTOTAL, CKMB, CKMBINDEX, TROPONINI in the last 168 hours. BNP (last 3 results) No results for input(s): PROBNP in the last 8760 hours. HbA1C: No results for input(s): HGBA1C in the last 72 hours. CBG: Recent Labs  Lab 05/04/20 1211  GLUCAP 155*   Lipid Profile: No results for input(s): CHOL, HDL, LDLCALC, TRIG, CHOLHDL, LDLDIRECT in the last 72 hours. Thyroid Function Tests: No results for input(s): TSH, T4TOTAL, FREET4, T3FREE, THYROIDAB in the last 72 hours. Anemia Panel: No results for input(s): VITAMINB12, FOLATE, FERRITIN, TIBC, IRON, RETICCTPCT in the last 72 hours. Urine analysis:    Component Value Date/Time   BILIRUBINUR 1 12/26/2014 1628   PROTEINUR 100 12/26/2014 1628   UROBILINOGEN 0.2 12/26/2014 1628   NITRITE Neg 12/26/2014 1628   LEUKOCYTESUR Trace 12/26/2014 1628   Sepsis  Labs: @LABRCNTIP (procalcitonin:4,lacticacidven:4)  ) Recent Results (from the past 240 hour(s))  Surgical pcr screen     Status: None   Collection Time:  05/01/20  3:22 AM   Specimen: Nasal Mucosa; Nasal Swab  Result Value Ref Range Status   MRSA, PCR NEGATIVE NEGATIVE Final   Staphylococcus aureus NEGATIVE NEGATIVE Final    Comment: (NOTE) The Xpert SA Assay (FDA approved for NASAL specimens in patients 33 years of age and older), is one component of a comprehensive surveillance program. It is not intended to diagnose infection nor to guide or monitor treatment. Performed at [facility], [place] [address] [place], [place] [address]   SARS CORONAVIRUS 2 (TAT 6-24 HRS) Nasopharyngeal Nasopharyngeal Swab     Status: None   Collection Time: 05/06/20  9:18 AM   Specimen: Nasopharyngeal Swab  Result Value Ref Range Status   SARS Coronavirus 2 NEGATIVE NEGATIVE Final    Comment: (NOTE) SARS-CoV-2 target nucleic acids are NOT DETECTED.  The SARS-CoV-2 RNA is generally detectable in upper and lower respiratory specimens during the acute phase of infection. Negative results do not preclude SARS-CoV-2 infection, do not rule out co-infections with other pathogens, and should not be used as the sole basis for treatment or other patient management decisions. Negative results must be combined with clinical observations, patient history, and epidemiological information. The expected result is Negative.  Fact Sheet for Patients: SugarRoll.be  Fact Sheet for Healthcare Providers: https://www.woods-mathews.com/  This test is not yet approved or cleared by the [place] FDA and  has been authorized for detection and/or diagnosis of SARS-CoV-2 by FDA under an Emergency Use Authorization (EUA). This EUA will remain  in effect (meaning this test can be used) for the duration of the COVID-19 declaration under Se ction 564(b)(1) of the Act, 21  U.S.C. section 360bbb-3(b)(1), unless the authorization is terminated or revoked sooner.  Performed at [facility], [place] [address] [place], [place] [address]   Respiratory Panel by RT PCR (Flu A&B, Covid) - Nasopharyngeal Swab     Status: None   Collection Time: 05/08/20  4:36 AM   Specimen: Nasopharyngeal Swab  Result Value Ref Range Status   SARS Coronavirus 2 by RT PCR NEGATIVE NEGATIVE Final    Comment: (NOTE) SARS-CoV-2 target nucleic acids are NOT DETECTED.  The SARS-CoV-2 RNA is generally detectable in upper respiratoy specimens during the acute phase of infection. The lowest concentration of SARS-CoV-2 viral copies this assay can detect is 131 copies/mL. A negative result does not preclude SARS-Cov-2 infection and should not be used as the sole basis for treatment or other patient management decisions. A negative result may occur with  improper specimen collection/handling, submission of specimen other than nasopharyngeal swab, presence of viral mutation(s) within the areas targeted by this assay, and inadequate number of viral copies (<131 copies/mL). A negative result must be combined with clinical observations, patient history, and epidemiological information. The expected result is Negative.  Fact Sheet for Patients:  PinkCheek.be  Fact Sheet for Healthcare Providers:  GravelBags.it  This test is no t yet approved or cleared by the [place] FDA and  has been authorized for detection and/or diagnosis of SARS-CoV-2 by FDA under an Emergency Use Authorization (EUA). This EUA will remain  in effect (meaning this test can be used) for the duration of the COVID-19 declaration under Section 564(b)(1) of the Act, 21 U.S.C. section 360bbb-3(b)(1), unless the authorization is terminated or revoked sooner.     Influenza A by PCR NEGATIVE NEGATIVE Final   Influenza B by PCR NEGATIVE NEGATIVE Final     Comment: (NOTE) The Xpert Xpress SARS-CoV-2/FLU/RSV assay  is intended as an aid in  the diagnosis of influenza from Nasopharyngeal swab specimens and  should not be used as a sole basis for treatment. Nasal washings and  aspirates are unacceptable for Xpert Xpress SARS-CoV-2/FLU/RSV  testing.  Fact Sheet for Patients: PinkCheek.be  Fact Sheet for Healthcare Providers: GravelBags.it  This test is not yet approved or cleared by the [place] FDA and  has been authorized for detection and/or diagnosis of SARS-CoV-2 by  FDA under an Emergency Use Authorization (EUA). This EUA will remain  in effect (meaning this test can be used) for the duration of the  Covid-19 declaration under Section 564(b)(1) of the Act, 21  U.S.C. section 360bbb-3(b)(1), unless the authorization is  terminated or revoked. Performed at [facility], [place] [address] [place], [place] [address]          Radiology Studies: No results found.      Scheduled Meds:  feeding supplement (ENSURE ENLIVE)  237 mL Oral BID BM   gabapentin  300 mg Oral BID   levothyroxine  75 mcg Oral QAC breakfast   psyllium  1 packet Oral Daily   rosuvastatin  5 mg Oral Once per day on Mon Wed Fri   Continuous Infusions:  heparin 1,100 Units/hr (05/10/20 0922)     LOS: 1 day    Time spent: 35 minutes minutes    Edwin Dada, MD [facility] 05/10/2020, 4:27 PM     Please page though [place] or Epic secure chat:  For [employer], Adult nurse

## 2020-05-11 LAB — CBC
HCT: 26.5 % — ABNORMAL LOW (ref 36.0–46.0)
Hemoglobin: 8.4 g/dL — ABNORMAL LOW (ref 12.0–15.0)
MCH: 30.4 pg (ref 26.0–34.0)
MCHC: 31.7 g/dL (ref 30.0–36.0)
MCV: 96 fL (ref 80.0–100.0)
Platelets: 260 10*3/uL (ref 150–400)
RBC: 2.76 MIL/uL — ABNORMAL LOW (ref 3.87–5.11)
RDW: 14.8 % (ref 11.5–15.5)
WBC: 7.3 10*3/uL (ref 4.0–10.5)
nRBC: 0 % (ref 0.0–0.2)

## 2020-05-11 LAB — HEPARIN LEVEL (UNFRACTIONATED): Heparin Unfractionated: 0.41 IU/mL (ref 0.30–0.70)

## 2020-05-11 MED ORDER — APIXABAN 5 MG PO TABS
5.0000 mg | ORAL_TABLET | Freq: Two times a day (BID) | ORAL | Status: DC
  Administered 2020-05-11 – 2020-05-12 (×3): 5 mg via ORAL
  Filled 2020-05-11 (×3): qty 1

## 2020-05-11 NOTE — Progress Notes (Signed)
[facility] PROGRESS NOTE    ARAH ARO  TLX:[id number] DOB: 1928-04-02 DOA: 05/08/2020 PCP: Colon Branch, MD      Brief Narrative:  Ariel Medina is a 86 y.o. F with HTN, hypothyroidism, remote BrCA, and then a recent admission for intussusception requiring right hemicolectomy 9/23 as well as DVT and PE.  Started on Eliquis during that admission, discharged 1 day prior to this admission, readmitted with rectal bleeding.  In the ER, patient's Hgb was 9.9 g/dL and she was actively bleeding.  She was given tranexamic acid only, and admitted.        Assessment & Plan:  Acute GI bleed due to suspected anastomotic bleeding Admitted to floor, Gen Surg consulted, given Kcentra, Eliquis held.  Had residual bleeding for about 24 hours, then resolved.    Heparin started 24 hours ago, no bleeding.    -Stop heparin -Resume DOAC -Soft diet -because she is gradually improving, Gen Surg plan no intervention -- I will observe 24 hours on oral AC and if stable, discharge back to SNF tomorrow    Acute blood loss anemia Hemoglobin stable, transfused 1 unit on hospital day 1 No further bleeding -Trend Hgb -Transfusion threshold 7 g/dL  Acute pulmonary embolism and bilateral DVT -Stop heparin -Restart Eliquis  Hypothyroidism -Continue levothyroxine  Polyneuropathy -Continue gabapentin  Hyperlipidemia -Continue Crestor  Hypertension Blood pressure normal -Hold hydrochlorothiazide        Disposition: Status is: Inpatient  Dispo: The patient is from: SNF              Anticipated d/c is to: SNF              Anticipated d/c date is: likely tomorrow              Patient currently is not medically stable to d/c.   The patient was admitted with what we believe is anastomotic bleeding.  Her blood thinner was held for 24 hours, the bleeding tailed off, and then it has been resumed last 24 hours without further bleeding.  We are optimistic that she will  be able to tolerate a DOAC today, and if no bleeding by tomorrow back to SNF  Mobilize today, incentive spirometry           MDM: The below labs and imaging reports were reviewed and summarized above.  Medication management as above.  Anticoagulation managed    DVT prophylaxis: SCDs  Code Status: FULL Family Communication:             Subjective: Bones bowel movement overnight, no fever, confusion, somnolence, seizure.  No vomiting.  Objective: Vitals:   05/10/20 0809 05/10/20 1645 05/10/20 2319 05/11/20 0725  BP: 134/66 118/71 113/69 116/65  Pulse: 83 88 (!) 101 89  Resp: 18 18 20 18   Temp: 97.9 F (36.6 C) 98.3 F (36.8 C) 98.6 F (37 C) 98.2 F (36.8 C)  TempSrc:   Oral   SpO2: 99% 92% 96% 98%  Weight:      Height:        Intake/Output Summary (Last 24 hours) at 05/11/2020 1032 Last data filed at 05/11/2020 7416 Gross per 24 hour  Intake 247.51 ml  Output 1500 ml  Net -1252.49 ml   Filed Weights   05/08/20 1404  Weight: 62.9 kg    Examination: General appearance: Frail elderly female, lying in bed, no acute distress HEENT: Anicteric, conjunctival pink, lids and lashes normal.  No nasal deformity, discharge, or epistaxis.  Lips moist. Skin: Some scattered senile ecchymoses, skin warm and dry, no suspicious rashes or lesions. Cardiac: Mildly tachycardic, regular, no murmurs.  She has a small gallop.  JVP normal, no lower extremity edema Respiratory: Normal respiratory rate and rhythm, lungs clear without rales or wheezes. Abdomen: Abdomen soft, mild tenderness palpation, nonfocal, no voluntary guarding, she has very active bowel sounds.  No distention or ascites. MSK: Diffuse loss of subcutaneous muscle mass and fat. Neuro: Wake and alert, extraocular movements intact, moves all extremities with very weak strength, but normal   coordination, speech fluent. Psych: Sensorium intact and responding to questions, attention normal. Affect normal.   Judgment and insight appear normal.    Data Reviewed: I have personally reviewed following labs and imaging studies:  CBC: Recent Labs  Lab 05/06/20 0318 05/06/20 0318 05/07/20 0411 05/07/20 0411 05/08/20 0408 05/08/20 1829 05/08/20 1932 05/09/20 0147 05/09/20 1543 05/10/20 0128 05/11/20 0152  WBC 10.1  --  8.6  --  8.8  --   --   --   --  7.4 7.3  HGB 10.7*   < > 10.5*   < > 9.9*   < > 7.6* 7.3* 8.9* 8.6* 8.4*  HCT 34.1*   < > 33.1*   < > 31.1*   < > 24.0* 23.1* 27.7* 26.4* 26.5*  MCV 96.9  --  95.7  --  96.9  --   --   --   --  95.3 96.0  PLT 304  --  306  --  336  --   --   --   --  291 260   < > = values in this interval not displayed.   Basic Metabolic Panel: Recent Labs  Lab 05/05/20 0327 05/08/20 0408 05/08/20 0812  NA 139 139  --   K 4.1 4.1  --   CL 103 98  --   CO2 30 34*  --   GLUCOSE 101* 106*  --   BUN <5* 7*  --   CREATININE 0.50 0.50  --   CALCIUM 8.5* 8.7*  --   MG 1.7  --  1.7  PHOS 2.7  --   --    GFR: Estimated Creatinine Clearance: 40.7 mL/min (by C-G formula based on SCr of 0.5 mg/dL). Liver Function Tests: Recent Labs  Lab 05/05/20 0327 05/08/20 0408  AST 14* 18  ALT 8 11  ALKPHOS 27* 31*  BILITOT 0.5 0.7  PROT 3.9* 4.4*  ALBUMIN 1.8* 2.1*   No results for input(s): LIPASE, AMYLASE in the last 168 hours. No results for input(s): AMMONIA in the last 168 hours. Coagulation Profile: Recent Labs  Lab 05/08/20 0408  INR 1.5*   Cardiac Enzymes: No results for input(s): CKTOTAL, CKMB, CKMBINDEX, TROPONINI in the last 168 hours. BNP (last 3 results) No results for input(s): PROBNP in the last 8760 hours. HbA1C: No results for input(s): HGBA1C in the last 72 hours. CBG: Recent Labs  Lab 05/04/20 1211  GLUCAP 155*   Lipid Profile: No results for input(s): CHOL, HDL, LDLCALC, TRIG, CHOLHDL, LDLDIRECT in the last 72 hours. Thyroid Function Tests: No results for input(s): TSH, T4TOTAL, FREET4, T3FREE, THYROIDAB in the last 72  hours. Anemia Panel: No results for input(s): VITAMINB12, FOLATE, FERRITIN, TIBC, IRON, RETICCTPCT in the last 72 hours. Urine analysis:    Component Value Date/Time   BILIRUBINUR 1 12/26/2014 1628   PROTEINUR 100 12/26/2014 1628   UROBILINOGEN 0.2 12/26/2014 1628   NITRITE Neg 12/26/2014 1628  LEUKOCYTESUR Trace 12/26/2014 1628   Sepsis Labs: @LABRCNTIP (procalcitonin:4,lacticacidven:4)  ) Recent Results (from the past 240 hour(s))  SARS CORONAVIRUS 2 (TAT 6-24 HRS) Nasopharyngeal Nasopharyngeal Swab     Status: None   Collection Time: 05/06/20  9:18 AM   Specimen: Nasopharyngeal Swab  Result Value Ref Range Status   SARS Coronavirus 2 NEGATIVE NEGATIVE Final    Comment: (NOTE) SARS-CoV-2 target nucleic acids are NOT DETECTED.  The SARS-CoV-2 RNA is generally detectable in upper and lower respiratory specimens during the acute phase of infection. Negative results do not preclude SARS-CoV-2 infection, do not rule out co-infections with other pathogens, and should not be used as the sole basis for treatment or other patient management decisions. Negative results must be combined with clinical observations, patient history, and epidemiological information. The expected result is Negative.  Fact Sheet for Patients: SugarRoll.be  Fact Sheet for Healthcare Providers: https://www.woods-mathews.com/  This test is not yet approved or cleared by the [place] FDA and  has been authorized for detection and/or diagnosis of SARS-CoV-2 by FDA under an Emergency Use Authorization (EUA). This EUA will remain  in effect (meaning this test can be used) for the duration of the COVID-19 declaration under Se ction 564(b)(1) of the Act, 21 U.S.C. section 360bbb-3(b)(1), unless the authorization is terminated or revoked sooner.  Performed at [facility], [place] [address], [place], [place] [address]   Respiratory Panel by RT PCR (Flu A&B,  Covid) - Nasopharyngeal Swab     Status: None   Collection Time: 05/08/20  4:36 AM   Specimen: Nasopharyngeal Swab  Result Value Ref Range Status   SARS Coronavirus 2 by RT PCR NEGATIVE NEGATIVE Final    Comment: (NOTE) SARS-CoV-2 target nucleic acids are NOT DETECTED.  The SARS-CoV-2 RNA is generally detectable in upper respiratoy specimens during the acute phase of infection. The lowest concentration of SARS-CoV-2 viral copies this assay can detect is 131 copies/mL. A negative result does not preclude SARS-Cov-2 infection and should not be used as the sole basis for treatment or other patient management decisions. A negative result may occur with  improper specimen collection/handling, submission of specimen other than nasopharyngeal swab, presence of viral mutation(s) within the areas targeted by this assay, and inadequate number of viral copies (<131 copies/mL). A negative result must be combined with clinical observations, patient history, and epidemiological information. The expected result is Negative.  Fact Sheet for Patients:  PinkCheek.be  Fact Sheet for Healthcare Providers:  GravelBags.it  This test is no t yet approved or cleared by the [place] FDA and  has been authorized for detection and/or diagnosis of SARS-CoV-2 by FDA under an Emergency Use Authorization (EUA). This EUA will remain  in effect (meaning this test can be used) for the duration of the COVID-19 declaration under Section 564(b)(1) of the Act, 21 U.S.C. section 360bbb-3(b)(1), unless the authorization is terminated or revoked sooner.     Influenza A by PCR NEGATIVE NEGATIVE Final   Influenza B by PCR NEGATIVE NEGATIVE Final    Comment: (NOTE) The Xpert Xpress SARS-CoV-2/FLU/RSV assay is intended as an aid in  the diagnosis of influenza from Nasopharyngeal swab specimens and  should not be used as a sole basis for treatment. Nasal  washings and  aspirates are unacceptable for Xpert Xpress SARS-CoV-2/FLU/RSV  testing.  Fact Sheet for Patients: PinkCheek.be  Fact Sheet for Healthcare Providers: GravelBags.it  This test is not yet approved or cleared by the [place] FDA and  has  been authorized for detection and/or diagnosis of SARS-CoV-2 by  FDA under an Emergency Use Authorization (EUA). This EUA will remain  in effect (meaning this test can be used) for the duration of the  Covid-19 declaration under Section 564(b)(1) of the Act, 21  U.S.C. section 360bbb-3(b)(1), unless the authorization is  terminated or revoked. Performed at [facility], [place] [address], [place], [place] [address]          Radiology Studies: No results found.      Scheduled Meds: . apixaban  5 mg Oral BID  . feeding supplement (ENSURE ENLIVE)  237 mL Oral BID BM  . gabapentin  300 mg Oral BID  . levothyroxine  75 mcg Oral QAC breakfast  . psyllium  1 packet Oral Daily  . rosuvastatin  5 mg Oral Once per day on Mon Wed Fri   Continuous Infusions:    LOS: 2 days    Time spent: 35 minutes     Edwin Dada, MD [facility] 05/11/2020, 10:32 AM     Please page though [place] or Epic secure chat:  For [employer], Adult nurse

## 2020-05-11 NOTE — Progress Notes (Signed)
ANTICOAGULATION CONSULT NOTE - Follow Up Consult  Pharmacy Consult:  Heparin Indication:  Recent DVT/PE  Assessment: 85 YOF on Eliquis for a PE/DVT in September.  Patient presented with GIB and received TXA and KCentra.  Pharmacy consulted to start IV heparin on 10/2 AM.    Heparin level 0.41 units/ml  Hg 8.4, PTLC 260  Goal of Therapy:  Heparin level 0.3-0.7 units/ml Monitor platelets by anticoagulation protocol: Yes   Plan:   -Continue IV heparin at 1250 units/hr  Check 8 hr heparin level to confirm Daily heparin level and CBC  Thanks for allowing pharmacy to be a part of this patient's care.  Excell Seltzer, PharmD Clinical Pharmacist

## 2020-05-11 NOTE — Progress Notes (Signed)
Physical Therapy Evaluation Patient Details Name: Ariel Medina MRN: [id number] DOB: 1928-01-19 Today's Date: 05/11/2020   History of Present Illness  Ariel Medina is a 86 y.o. female with medical history significant of breast cancer, diverticulitis, hypertension, hyperlipidemia, hypothyroidism, osteoarthritis, osteopenia presented to the ED with complaints of rectal bleeding.  Patient was recently admitted to the hospital 9/21-9/29 for intussusception with mild rectal bleeding which was treated surgically with an open extended right hemicolectomy on 9/23.  Patient was also diagnosed with bilateral PE without right heart strain and bilateral lower extremity DVTs for which she was started on Eliquis.  Clinical Impression  Pt admitted with/for rectal bleeding after recent d/c.  Pt needing minimal assist at this time for general mobility.  Pt currently limited functionally due to the problems listed. ( See problems list.)   Pt will benefit from PT to maximize function and safety in order to get ready for next venue listed below.     Follow Up Recommendations SNF;Supervision for mobility/OOB    Equipment Recommendations  None recommended by PT    Recommendations for Other Services       Precautions / Restrictions Precautions Precautions: Fall      Mobility  Bed Mobility Overal bed mobility: Needs Assistance     Sidelying to sit: Min assist;HOB elevated       General bed mobility comments: assist to come up and forward.  Once up struggles to scoot, but with min guard only  Transfers Overall transfer level: Needs assistance Equipment used: Rolling walker (2 wheeled) Transfers: Sit to/from Stand Sit to Stand: Min assist         General transfer comment: cues for hand placement, assist to come forward and up.  Ambulation/Gait Ambulation/Gait assistance: Min guard Gait Distance (Feet): 30 Feet Assistive device: Rolling walker (2 wheeled) Gait Pattern/deviations:  Step-through pattern Gait velocity: Decreased Gait velocity interpretation: <1.31 ft/sec, indicative of household ambulator General Gait Details: slow, low amplitude steps, SpO2 dropping into the low to mid 80's on RA, HR 90's  Returned to 98% quickly back on 2L.  Stairs            Wheelchair Mobility    Modified Rankin (Stroke Patients Only)       Balance     Sitting balance-Leahy Scale: Fair     Standing balance support: Bilateral upper extremity supported Standing balance-Leahy Scale: Poor Standing balance comment: reliant on AD                             Pertinent Vitals/Pain Pain Assessment: Faces Faces Pain Scale: Hurts a little bit Pain Location: abdomen Pain Descriptors / Indicators: Sore Pain Intervention(s): Monitored during session    Home Living Family/patient expects to be discharged to:: Inpatient rehab Living Arrangements: Children Available Help at Discharge: Family;Available PRN/intermittently Type of Home: House Home Access: Level entry     Home Layout: One level Home Equipment: Walker - 2 wheels;Cane - single point;Shower seat;Grab bars - tub/shower;Bedside commode      Prior Function Level of Independence: Needs assistance   Gait / Transfers Assistance Needed: Uses RW in home, SPC out to mailbox; enjoys sitting on the porch, has not been able to garden much anymore  ADL's / Homemaking Assistance Needed: patient's sister lives next door and she assists patient with bathing, IADLs "anything I need" grandson does grocery shopping    Comments: grandson lives with her but he works  Hand Dominance   Dominant Hand: Right    Extremity/Trunk Assessment   Upper Extremity Assessment Upper Extremity Assessment: Generalized weakness    Lower Extremity Assessment Lower Extremity Assessment: Generalized weakness    Cervical / Trunk Assessment Cervical / Trunk Assessment: Kyphotic  Communication   Communication: HOH   Cognition Arousal/Alertness: Awake/alert Behavior During Therapy: WFL for tasks assessed/performed Overall Cognitive Status: Within Functional Limits for tasks assessed                                 General Comments: patient following directions appropriately      General Comments      Exercises     Assessment/Plan    PT Assessment Patient needs continued PT services  PT Problem List Decreased strength;Decreased activity tolerance;Decreased mobility;Cardiopulmonary status limiting activity       PT Treatment Interventions DME instruction;Gait training;Stair training;Functional mobility training;Therapeutic activities;Therapeutic exercise;Balance training;Patient/family education    PT Goals (Current goals can be found in the Care Plan section)  Acute Rehab PT Goals Patient Stated Goal: hoping to get some therapy and start getting her strength back. PT Goal Formulation: With patient Time For Goal Achievement: 05/25/20 Potential to Achieve Goals: Good    Frequency Min 2X/week   Barriers to discharge Decreased caregiver support      Co-evaluation               AM-PAC PT "6 Clicks" Mobility  Outcome Measure Help needed turning from your back to your side while in a flat bed without using bedrails?: A Little Help needed moving from lying on your back to sitting on the side of a flat bed without using bedrails?: A Little Help needed moving to and from a bed to a chair (including a wheelchair)?: A Little Help needed standing up from a chair using your arms (e.g., wheelchair or bedside chair)?: A Little Help needed to walk in hospital room?: A Little Help needed climbing 3-5 steps with a railing? : A Lot 6 Click Score: 17    End of Session   Activity Tolerance: Patient tolerated treatment well;Treatment limited secondary to medical complications (Comment) Patient left: in chair;with call bell/phone within reach;with nursing/sitter in room;with chair  alarm set Nurse Communication: Mobility status PT Visit Diagnosis: Other abnormalities of gait and mobility (R26.89);Muscle weakness (generalized) (M62.81);Difficulty in walking, not elsewhere classified (R26.2) Pain - part of body:  (mild in the abdomen)    Time: 1856-3149 PT Time Calculation (min) (ACUTE ONLY): 30 min   Charges:   PT Evaluation $PT Eval Moderate Complexity: 1 Mod PT Treatments $Gait Training: 8-22 mins        05/11/2020  Ginger Carne., PT Acute Rehabilitation Services [phone]  (pager) [phone]  (office)  Tessie Fass Lama Narayanan 05/11/2020, 3:48 PM

## 2020-05-11 NOTE — Progress Notes (Signed)
Progress Note: General Surgery Service   Chief Complaint/Subjective: Eating well, had bowel movement that did not appear bloody yesterday  Objective: Vital signs in last 24 hours: Temp:  [98.2 F (36.8 C)-98.6 F (37 C)] 98.2 F (36.8 C) (10/03 0725) Pulse Rate:  [88-101] 89 (10/03 0725) Resp:  [18-20] 18 (10/03 0725) BP: (113-118)/(65-71) 116/65 (10/03 0725) SpO2:  [92 %-98 %] 98 % (10/03 0725) Last BM Date: 05/10/20  Intake/Output from previous day: 10/02 0701 - 10/03 0700 In: 484.5 [P.O.:237; I.V.:247.5] Out: 1500 [Urine:1500] Intake/Output this shift: No intake/output data recorded.  Gen: NAD  Resp: nonlabored  Card: RRR  Abd: soft, NT, ND, incision c/d/i  Lab Results: CBC  Recent Labs    05/10/20 0128 05/11/20 0152  WBC 7.4 7.3  HGB 8.6* 8.4*  HCT 26.4* 26.5*  PLT 291 260   BMET No results for input(s): NA, K, CL, CO2, GLUCOSE, BUN, CREATININE, CALCIUM in the last 72 hours. PT/INR No results for input(s): LABPROT, INR in the last 72 hours. ABG No results for input(s): PHART, HCO3 in the last 72 hours.  Invalid input(s): PCO2, PO2  Anti-infectives: Anti-infectives (From admission, onward)   None      Medications: Scheduled Meds: . apixaban  5 mg Oral BID  . feeding supplement (ENSURE ENLIVE)  237 mL Oral BID BM  . gabapentin  300 mg Oral BID  . levothyroxine  75 mcg Oral QAC breakfast  . psyllium  1 packet Oral Daily  . rosuvastatin  5 mg Oral Once per day on Mon Wed Fri   Continuous Infusions: . heparin 1,250 Units/hr (05/11/20 2671)   PRN Meds:.acetaminophen, fluticasone, oxyCODONE  Assessment/Plan: POD 9S/p open extended R hemicolectomy with stapled anastomosis 05/01/20 Dr. Ninfa Linden  - surgical path showed submucosal lipoma, no malignancy - Hgb stable after 24 h heparin, last heparin number in good range, BM yesterday without bleeding signs. Can resume DOAC later today or tomorrow - serial h/h -soft diet - no indication for  surgical or GI intervention acutely but we will follow and consult GI if needed Bilateral PE with hypoxia- on supplemental O2 LUL mass on CT- PNA vs neoplasm, repeat CT in 3-4 weeks recommended   LOS: 2 days   Mickeal Skinner, MD [facility]

## 2020-05-12 DIAGNOSIS — R41841 Cognitive communication deficit: Secondary | ICD-10-CM | POA: Diagnosis not present

## 2020-05-12 DIAGNOSIS — D62 Acute posthemorrhagic anemia: Secondary | ICD-10-CM | POA: Diagnosis not present

## 2020-05-12 DIAGNOSIS — I2699 Other pulmonary embolism without acute cor pulmonale: Secondary | ICD-10-CM | POA: Diagnosis not present

## 2020-05-12 DIAGNOSIS — K561 Intussusception: Secondary | ICD-10-CM | POA: Diagnosis not present

## 2020-05-12 DIAGNOSIS — R2681 Unsteadiness on feet: Secondary | ICD-10-CM | POA: Diagnosis not present

## 2020-05-12 DIAGNOSIS — Z789 Other specified health status: Secondary | ICD-10-CM | POA: Diagnosis not present

## 2020-05-12 DIAGNOSIS — R58 Hemorrhage, not elsewhere classified: Secondary | ICD-10-CM | POA: Diagnosis not present

## 2020-05-12 DIAGNOSIS — M2550 Pain in unspecified joint: Secondary | ICD-10-CM | POA: Diagnosis not present

## 2020-05-12 DIAGNOSIS — K922 Gastrointestinal hemorrhage, unspecified: Secondary | ICD-10-CM | POA: Diagnosis not present

## 2020-05-12 DIAGNOSIS — R918 Other nonspecific abnormal finding of lung field: Secondary | ICD-10-CM | POA: Diagnosis not present

## 2020-05-12 DIAGNOSIS — I82403 Acute embolism and thrombosis of unspecified deep veins of lower extremity, bilateral: Secondary | ICD-10-CM | POA: Diagnosis not present

## 2020-05-12 DIAGNOSIS — M8000XS Age-related osteoporosis with current pathological fracture, unspecified site, sequela: Secondary | ICD-10-CM | POA: Diagnosis not present

## 2020-05-12 DIAGNOSIS — I10 Essential (primary) hypertension: Secondary | ICD-10-CM | POA: Diagnosis not present

## 2020-05-12 DIAGNOSIS — Z7401 Bed confinement status: Secondary | ICD-10-CM | POA: Diagnosis not present

## 2020-05-12 DIAGNOSIS — E785 Hyperlipidemia, unspecified: Secondary | ICD-10-CM | POA: Diagnosis not present

## 2020-05-12 DIAGNOSIS — M6281 Muscle weakness (generalized): Secondary | ICD-10-CM | POA: Diagnosis not present

## 2020-05-12 DIAGNOSIS — R531 Weakness: Secondary | ICD-10-CM | POA: Diagnosis not present

## 2020-05-12 DIAGNOSIS — J069 Acute upper respiratory infection, unspecified: Secondary | ICD-10-CM | POA: Diagnosis not present

## 2020-05-12 DIAGNOSIS — E876 Hypokalemia: Secondary | ICD-10-CM | POA: Diagnosis not present

## 2020-05-12 DIAGNOSIS — D649 Anemia, unspecified: Secondary | ICD-10-CM | POA: Diagnosis not present

## 2020-05-12 DIAGNOSIS — Z03818 Encounter for observation for suspected exposure to other biological agents ruled out: Secondary | ICD-10-CM | POA: Diagnosis not present

## 2020-05-12 DIAGNOSIS — E039 Hypothyroidism, unspecified: Secondary | ICD-10-CM | POA: Diagnosis not present

## 2020-05-12 DIAGNOSIS — J309 Allergic rhinitis, unspecified: Secondary | ICD-10-CM | POA: Diagnosis not present

## 2020-05-12 LAB — BASIC METABOLIC PANEL
Anion gap: 6 (ref 5–15)
BUN: 11 mg/dL (ref 8–23)
CO2: 33 mmol/L — ABNORMAL HIGH (ref 22–32)
Calcium: 8.6 mg/dL — ABNORMAL LOW (ref 8.9–10.3)
Chloride: 102 mmol/L (ref 98–111)
Creatinine, Ser: 0.61 mg/dL (ref 0.44–1.00)
GFR calc Af Amer: >60 mL/min (ref >60)
GFR calc non Af Amer: >60 mL/min (ref >60)
Glucose, Bld: 103 mg/dL — ABNORMAL HIGH (ref 70–99)
Potassium: 4.1 mmol/L (ref 3.5–5.1)
Sodium: 141 mmol/L (ref 135–145)

## 2020-05-12 LAB — CBC
HCT: 27 % — ABNORMAL LOW (ref 36.0–46.0)
Hemoglobin: 8.5 g/dL — ABNORMAL LOW (ref 12.0–15.0)
MCH: 30.6 pg (ref 26.0–34.0)
MCHC: 31.5 g/dL (ref 30.0–36.0)
MCV: 97.1 fL (ref 80.0–100.0)
Platelets: 254 10*3/uL (ref 150–400)
RBC: 2.78 MIL/uL — ABNORMAL LOW (ref 3.87–5.11)
RDW: 14.7 % (ref 11.5–15.5)
WBC: 6.8 10*3/uL (ref 4.0–10.5)
nRBC: 0 % (ref 0.0–0.2)

## 2020-05-12 LAB — SARS CORONAVIRUS 2 BY RT PCR (HOSPITAL ORDER, PERFORMED IN ~~LOC~~ HOSPITAL LAB): SARS Coronavirus 2: NEGATIVE

## 2020-05-12 MED ORDER — APIXABAN 5 MG PO TABS
5.0000 mg | ORAL_TABLET | Freq: Two times a day (BID) | ORAL | 1 refills | Status: DC
Start: 2020-05-12 — End: 2020-05-26

## 2020-05-12 MED ORDER — OXYCODONE HCL 5 MG PO TABS
5.0000 mg | ORAL_TABLET | Freq: Four times a day (QID) | ORAL | 0 refills | Status: DC | PRN
Start: 2020-05-12 — End: 2020-05-26

## 2020-05-12 MED ORDER — ENSURE ENLIVE PO LIQD: 237.0000 mL | Freq: Two times a day (BID) | ORAL | 12 refills | Status: DC

## 2020-05-12 NOTE — Progress Notes (Signed)
   Subjective/Chief Complaint: No bloody BM. Reports she is going to rehab today.   Objective: Vital signs in last 24 hours: Temp:  [98.1 F (36.7 C)-98.4 F (36.9 C)] 98.4 F (36.9 C) (10/04 0819) Pulse Rate:  [84-98] 84 (10/04 0819) Resp:  [16-18] 17 (10/04 0819) BP: (108-122)/(71-75) 122/72 (10/04 0819) SpO2:  [91 %-99 %] 99 % (10/04 0819) Last BM Date: 05/10/20  Intake/Output from previous day: 10/03 0701 - 10/04 0700 In: -  Out: 913 [Urine:913] Intake/Output this shift: No intake/output data recorded.  GI: soft, NT, incision CDI, active BS  Lab Results:  Recent Labs    05/11/20 0152 05/12/20 0330  WBC 7.3 6.8  HGB 8.4* 8.5*  HCT 26.5* 27.0*  PLT 260 254   BMET Recent Labs    05/12/20 0330  NA 141  K 4.1  CL 102  CO2 33*  GLUCOSE 103*  BUN 11  CREATININE 0.61  CALCIUM 8.6*   PT/INR No results for input(s): LABPROT, INR in the last 72 hours. ABG No results for input(s): PHART, HCO3 in the last 72 hours.  Invalid input(s): PCO2, PO2  Studies/Results: No results found.  Anti-infectives: Anti-infectives (From admission, onward)   None      Assessment/Plan: POD10S/p open extended R hemicolectomy with stapled anastomosis 05/01/20 Dr. Ninfa Linden  - surgical path showed submucosal lipoma, no malignancy -Hgb stable  - back on Eliquis - F/U at [place] 10/7 for staple removal then 10/19 with Dr. Ninfa Linden Bilateral PE with hypoxia LUL mass on CT- PNA vs neoplasm, repeat CT in 3-4 weeks recommended  LOS: 3 days    Zenovia Jarred 05/12/2020

## 2020-05-12 NOTE — TOC Transition Note (Signed)
Transition of Care [facility]) - CM/SW Discharge Note   Patient Details  Name: SURIYAH VERGARA MRN: [id number] Date of Birth: Feb 05, 1928  [facility] CM/SW Contact:  Joanne Chars, LCSW Phone Number: 05/12/2020, 2:54 PM   Clinical Narrative:   Pt discharging to [facility], room 101.  RN call report to [phone].      Final next level of care: Skilled Nursing Facility Barriers to Discharge: No Barriers Identified   Patient Goals and CMS Choice        Discharge Placement              Patient chooses bed at: [facility] Patient to be transferred to facility by: [place] Name of family member notified: None--could not reach granddaughter.  Pt stated granddaughter is aware. Patient and family notified of of transfer: 05/12/20  Discharge Plan and Services                                     Social Determinants of Health (SDOH) Interventions     Readmission Risk Interventions No flowsheet data found.

## 2020-05-12 NOTE — Discharge Summary (Signed)
Physician Discharge Summary  Ariel Medina NTZ:[id number] DOB: [birth year] DOA: 05/08/2020  PCP: Colon Branch, MD  Admit date: 05/08/2020 Discharge date: 05/12/2020  Admitted From: SNF  Disposition:  SNF   Recommendations for Outpatient Follow-up:  1. Follow up with [facility] on 10/7 for follow up at 10AM and again with Dr. Ninfa Linden on 10/19  2. Continue Eliquis 3. Repeat CBC in 1 week 4. Call [facility] to advance diet     Home Health: N/A  Equipment/Devices: TBD at SNF  Discharge Condition: Fair  CODE STATUS: FULL Diet recommendation: Soft  Brief/Interim Summary: Ariel Medina is a 86 y.o. F with HTN, hypothyroidism, remote BrCA, and then a recent admission for intussusception requiring right hemicolectomy 9/23 as well as DVT and PE.  Started on Eliquis during that admission, discharged 1 day prior to this admission, readmitted with rectal bleeding.  In the ER, patient's Hgb was 9.9 g/dL and she was actively bleeding.  She was given tranexamic acid only, and admitted.      PRINCIPAL HOSPITAL DIAGNOSIS: Acute GI bleed    Discharge Diagnoses:   Acute GI bleed due to suspected anastomotic bleeding Given Kcentra, Eliquis held.  Admitted to floor, Gen Surg consulted.  Suspected to be anastomotic bleeding.  Bleeding resolved after about 24 hours.  Anticoagulation was restarted cautiously with heparin, patient had no further bleeding, had a Oehlert BM so Eliquis was restarted and she was monitored for 24 hours without recurrence of bleeding.     Acute blood loss anemia Transfused 1 unit on hospital day 1, no further bleeding, Hgb remained stable.  Acute pulmonary embolism and bilateral DVT Continue Eliquis  Hypothyroidism Continue levothyroxine  Polyneuropathy Continue gabapentin  Hyperlipidemia Continue Crestor  Hypertension Blood pressure normal off meds.  Hold hydrochlorothiazide until needed.          Discharge  Instructions  Discharge Instructions    Discharge instructions   Complete by: As directed    You were admitted for bleeding. Thankfully, this stopped by itself, and is likely healing up.  Follow up with General Surgery as instructed  Resume your HCTZ in 1-2 weeks if needed   Increase activity slowly   Complete by: As directed    No wound care   Complete by: As directed      Allergies as of 05/12/2020      Reactions   Ibuprofen    REACTION: bleed      Medication List    STOP taking these medications   hydrochlorothiazide 25 MG tablet Commonly known as: HYDRODIURIL     TAKE these medications   acetaminophen 325 MG tablet Commonly known as: TYLENOL Take 2 tablets (650 mg total) by mouth every 6 (six) hours as needed for mild pain.   apixaban 5 MG Tabs tablet Commonly known as: ELIQUIS Take 1 tablet (5 mg total) by mouth 2 (two) times daily. What changed:   how much to take  how to take this  when to take this  additional instructions   bisacodyl 10 MG suppository Commonly known as: DULCOLAX Place 10 mg rectally once as needed for moderate constipation (if no results with MOM).   feeding supplement (ENSURE ENLIVE) Liqd Take 237 mLs by mouth 2 (two) times daily between meals.   fluticasone 50 MCG/ACT nasal spray Commonly known as: FLONASE Place 2 sprays into both nostrils daily as needed for allergies or rhinitis.   gabapentin 300 MG capsule Commonly known as: NEURONTIN Take 1 capsule (300  mg total) by mouth 2 (two) times daily.   ICAPS AREDS 2 PO Take 1 capsule by mouth 2 (two) times daily.   levothyroxine 75 MCG tablet Commonly known as: SYNTHROID Take 1 tablet (75 mcg total) by mouth daily before breakfast.   magnesium hydroxide 400 MG/5ML suspension Commonly known as: MILK OF MAGNESIA Take 30 mLs by mouth once as needed for mild constipation (if no BM in 3 days).   oxyCODONE 5 MG immediate release tablet Commonly known as: Oxy  IR/ROXICODONE Take 1 tablet (5 mg total) by mouth every 6 (six) hours as needed for moderate pain or severe pain.   polyethylene glycol 17 g packet Commonly known as: MIRALAX / GLYCOLAX Take 17 g by mouth daily as needed for moderate constipation.   potassium chloride 10 MEQ tablet Commonly known as: KLOR-CON Take 1 tablet (10 mEq total) by mouth daily.   psyllium 95 % Pack Commonly known as: HYDROCIL/METAMUCIL Take 1 packet by mouth daily. Hold for diarrhoea   rosuvastatin 5 MG tablet Commonly known as: CRESTOR Take 1 tablet (5 mg total) by mouth 3 (three) times a week. No specific days       Follow-up Information    Coralie Keens, MD. Go on 05/27/2020.   Specialty: General Surgery Why: at 2:10 PM for post-operative follow up. please arrive 30 minutes early to get checked in and fill out any necessary paperwork. Contact information: [address] [place] [address] [phone]        [facility], [place]. Go on 05/15/2020.   Specialty: General Surgery Why: Appointment for staple removal scheduled for 10:00 AM. Please arrive 15 min prior to appointment time for check in. Engineer, civil (consulting) ID and Soil scientist information: [place] [phone]             Allergies  Allergen Reactions  . Ibuprofen     REACTION: bleed    Consultations:  General Surgery   Procedures/Studies: CT ABDOMEN PELVIS WO CONTRAST  Result Date: 05/08/2020 CLINICAL DATA:  Recent partial colectomy.  Gastrointestinal bleeding EXAM: CT ABDOMEN AND PELVIS WITHOUT CONTRAST TECHNIQUE: Multidetector CT imaging of the abdomen and pelvis was performed following the standard protocol without oral or IV contrast. COMPARISON:  April 29, 2020 FINDINGS: Lower chest: There are pleural effusions bilaterally. There is compressive atelectasis in the lung bases with suspected superimposed degree of pneumonia in the  posterior left base. There is a sizable hiatal type hernia present. There is a suspected lipoma within the hiatal hernia measuring 7 x 7 mm. Hepatobiliary: No focal liver lesions are evident on this noncontrast enhanced study. The gallbladder wall is not appreciably thickened. There is no biliary duct dilatation. Pancreas: There is a partially calcified apparent mass arising along the rightward aspect of the head of the pancreas eccentrically measuring 1.7 x 1.7 x 1.7 cm. No other pancreatic lesion evident. Spleen: No splenic lesions are appreciable. Adrenals/Urinary Tract: Adrenals bilaterally appear normal. There is a cyst arising from the posterior mid right kidney measuring 1.7 x 1.7 cm. There is a cyst lobulated cyst arising from the lower pole the left kidney laterally measuring 2.6 x 2.5 cm. There is no appreciable hydronephrosis on either side. There is no appreciable renal or ureteral calculus on either side. Urinary bladder is midline with wall thickness within normal limits. Stomach/Bowel: Postoperative changes noted in the transverse colon region proximally with patent anastomoses. No fluid collection or extraluminal air  seen in this area of recent surgery. Slight soft tissue stranding in this area is likely of postoperative etiology. There are multiple sigmoid diverticula without demonstrable diverticulitis. There is no demonstrable bowel obstruction. The terminal ileum region appears normal. There is no demonstrable free air or portal venous air. Vascular/Lymphatic: No abdominal aortic aneurysm. There is aortic and iliac artery atherosclerosis. There is a left retroaortic renal vein, an anatomic variant. No adenopathy evident in the abdomen or pelvis. Reproductive: Uterus is anteverted.  There is no adnexal mass. Other: There is no appreciable periappendiceal region inflammation. No abscess or ascites is evident in the abdomen or pelvis. Musculoskeletal: Bones are osteoporotic. There is degenerative  change in each hip joint. There is also degenerative change in the lumbar spine. No blastic or lytic bone lesions. There is no intramuscular or abdominal wall lesion evident. IMPRESSION: 1. Status post removal of previous lesion in the colon near the hepatic flexure involving the proximal transverse colon. Anastomosis patent in this area. Slight soft tissue stranding in this area is felt to be due to recent surgery. There is no evidence suggesting perforation or abscess. No fluid is seen surrounding this area of recent surgery. 2.  Sigmoid diverticula without diverticulitis. 3. Pleural effusions bilaterally. Compressive atelectasis in the lung bases with suspected focus of pneumonia in the posterior left base. 4. Sizable hiatal hernia. Small lipoma noted within the rightward aspect of this hiatal hernia. 5.  No bowel obstruction.  No abscess in the abdomen or pelvis. 6. 1.7 x 1.7 x 1.7 cm partially calcified mass arising eccentrically from the pancreatic head. Significance of this finding in this age group is uncertain. Particular attention this area on subsequent evaluations is warranted. Pancreas otherwise appears normal. 7.  Aortic Atherosclerosis (ICD10-I70.0). Comment: A site for gastrointestinal bleeding has not been established with this study. If bleeding continues, nuclear medicine gastrointestinal bleeding study may be helpful for further assessment and potential localization of bleeding site. Electronically Signed   By: Lowella Grip III M.D.   On: 05/08/2020 08:19   DG Abd 1 View  Result Date: 04/25/2020 CLINICAL DATA:  Abdominal pain, loose stools EXAM: ABDOMEN - 1 VIEW COMPARISON:  None. FINDINGS: A few mildly dilated central left abdominal bowel loops with air-fluid levels. Mild colonic gas and stool. No evidence of pneumatosis or pneumoperitoneum. Blunting of the costophrenic angles bilaterally. No radiopaque nephrolithiasis. Marked lumbar spondylosis. Severe bilateral hip osteoarthritis.  IMPRESSION: A few mildly dilated central left abdominal bowel loops with air-fluid levels, suspicious for mid to distal small bowel obstruction. Further evaluation with CT abdomen/pelvis with oral and IV contrast suggested. These results will be called to the ordering clinician or representative by the Radiology Department at the imaging location. Electronically Signed   By: Ilona Sorrel M.D.   On: 04/25/2020 17:42   CT Angio Chest PE W and/or Wo Contrast  Result Date: 04/29/2020 CLINICAL DATA:  Lower abdominal pain, bloody stools. EXAM: CT ANGIOGRAPHY CHEST CT ABDOMEN AND PELVIS WITH CONTRAST TECHNIQUE: Multidetector CT imaging of the chest was performed using the standard protocol during bolus administration of intravenous contrast. Multiplanar CT image reconstructions and MIPs were obtained to evaluate the vascular anatomy. Multidetector CT imaging of the abdomen and pelvis was performed using the standard protocol during bolus administration of intravenous contrast. CONTRAST:  186m OMNIPAQUE IOHEXOL 350 MG/ML SOLN COMPARISON:  None. FINDINGS: CTA CHEST FINDINGS Cardiovascular: Filling defects are noted in peripheral branches of both pulmonary arteries consistent with acute pulmonary emboli. Atherosclerosis of thoracic aorta  is noted without aneurysm or dissection. Mild cardiomegaly is noted. No pericardial effusion is noted. Mediastinum/Nodes: Large sliding-type hiatal hernia is noted. Thyroid gland is unremarkable. No adenopathy is noted. Lungs/Pleura: No pneumothorax or pleural effusion is noted. Irregular density is noted peripherally in the left upper lobe with 16 x 11 mm irregular solid density; this may simply represent focal inflammation, but underlying neoplasm cannot be excluded. Follow-up CT scan in 3 weeks is recommended to ensure resolution or stability. Musculoskeletal: No chest wall abnormality. No acute or significant osseous findings. Review of the MIP images confirms the above findings. CT  ABDOMEN and PELVIS FINDINGS Hepatobiliary: No focal liver abnormality is seen. No gallstones, gallbladder wall thickening, or biliary dilatation. Pancreas: Unremarkable. No pancreatic ductal dilatation or surrounding inflammatory changes. Spleen: Normal in size without focal abnormality. Adrenals/Urinary Tract: Adrenal glands are unremarkable. Bilateral renal cysts are noted. No hydronephrosis or renal obstruction is noted. No renal or ureteral calculi are noted. Urinary bladder is unremarkable. Stomach/Bowel: There is a large intussusception involving the terminal ileum, right and transverse colon. Possible 19 mm lipoma may be at the lead point. Sigmoid diverticulosis is noted without inflammation. Vascular/Lymphatic: Aortic atherosclerosis. No enlarged abdominal or pelvic lymph nodes. Reproductive: Uterus and bilateral adnexa are unremarkable. Other: No abdominal wall hernia or abnormality. No abdominopelvic ascites. Musculoskeletal: No acute or significant osseous findings. Review of the MIP images confirms the above findings. IMPRESSION: 1. Acute bilateral pulmonary emboli are noted. 2. Irregular density is noted peripherally in the left upper lobe with 16 x 11 mm irregular solid density; this may simply represent focal inflammation, but underlying neoplasm cannot be excluded. Follow-up CT scan in 3 weeks is recommended to ensure resolution or stability. 3. Large intussusception is seen involving the terminal ileum, right and transverse colon. Possible 19 mm lipoma may be at the lead point. 4. Large sliding-type hiatal hernia is noted. 5. Sigmoid diverticulosis without inflammation. Critical Value/emergent results were called by telephone at the time of interpretation on 04/29/2020 at 3:17 pm to provider [facility] , who verbally acknowledged these results. Aortic Atherosclerosis (ICD10-I70.0). Electronically Signed   By: Marijo Conception M.D.   On: 04/29/2020 15:17   CT ABDOMEN PELVIS W CONTRAST  Result  Date: 04/29/2020 CLINICAL DATA:  Lower abdominal pain, bloody stools. EXAM: CT ANGIOGRAPHY CHEST CT ABDOMEN AND PELVIS WITH CONTRAST TECHNIQUE: Multidetector CT imaging of the chest was performed using the standard protocol during bolus administration of intravenous contrast. Multiplanar CT image reconstructions and MIPs were obtained to evaluate the vascular anatomy. Multidetector CT imaging of the abdomen and pelvis was performed using the standard protocol during bolus administration of intravenous contrast. CONTRAST:  140m OMNIPAQUE IOHEXOL 350 MG/ML SOLN COMPARISON:  None. FINDINGS: CTA CHEST FINDINGS Cardiovascular: Filling defects are noted in peripheral branches of both pulmonary arteries consistent with acute pulmonary emboli. Atherosclerosis of thoracic aorta is noted without aneurysm or dissection. Mild cardiomegaly is noted. No pericardial effusion is noted. Mediastinum/Nodes: Large sliding-type hiatal hernia is noted. Thyroid gland is unremarkable. No adenopathy is noted. Lungs/Pleura: No pneumothorax or pleural effusion is noted. Irregular density is noted peripherally in the left upper lobe with 16 x 11 mm irregular solid density; this may simply represent focal inflammation, but underlying neoplasm cannot be excluded. Follow-up CT scan in 3 weeks is recommended to ensure resolution or stability. Musculoskeletal: No chest wall abnormality. No acute or significant osseous findings. Review of the MIP images confirms the above findings. CT ABDOMEN and PELVIS FINDINGS Hepatobiliary: No focal  liver abnormality is seen. No gallstones, gallbladder wall thickening, or biliary dilatation. Pancreas: Unremarkable. No pancreatic ductal dilatation or surrounding inflammatory changes. Spleen: Normal in size without focal abnormality. Adrenals/Urinary Tract: Adrenal glands are unremarkable. Bilateral renal cysts are noted. No hydronephrosis or renal obstruction is noted. No renal or ureteral calculi are noted.  Urinary bladder is unremarkable. Stomach/Bowel: There is a large intussusception involving the terminal ileum, right and transverse colon. Possible 19 mm lipoma may be at the lead point. Sigmoid diverticulosis is noted without inflammation. Vascular/Lymphatic: Aortic atherosclerosis. No enlarged abdominal or pelvic lymph nodes. Reproductive: Uterus and bilateral adnexa are unremarkable. Other: No abdominal wall hernia or abnormality. No abdominopelvic ascites. Musculoskeletal: No acute or significant osseous findings. Review of the MIP images confirms the above findings. IMPRESSION: 1. Acute bilateral pulmonary emboli are noted. 2. Irregular density is noted peripherally in the left upper lobe with 16 x 11 mm irregular solid density; this may simply represent focal inflammation, but underlying neoplasm cannot be excluded. Follow-up CT scan in 3 weeks is recommended to ensure resolution or stability. 3. Large intussusception is seen involving the terminal ileum, right and transverse colon. Possible 19 mm lipoma may be at the lead point. 4. Large sliding-type hiatal hernia is noted. 5. Sigmoid diverticulosis without inflammation. Critical Value/emergent results were called by telephone at the time of interpretation on 04/29/2020 at 3:17 pm to provider [facility] , who verbally acknowledged these results. Aortic Atherosclerosis (ICD10-I70.0). Electronically Signed   By: Marijo Conception M.D.   On: 04/29/2020 15:17   DG Chest Portable 1 View  Result Date: 04/29/2020 CLINICAL DATA:  Shortness of breath. EXAM: PORTABLE CHEST 1 VIEW COMPARISON:  05/24/2017 FINDINGS: Large hiatal hernia, better characterized on priors. Similar lung hyperinflation. Small left pleural effusion versus scar. Bibasilar (left greater than right) opacities. Mild enlargement of the cardiac silhouette, similar to prior. Aortic atherosclerosis. No acute bony abnormality. IMPRESSION: 1. Small left pleural effusion versus scar. Bibasilar  opacities may represent atelectasis, although aspiration and/or pneumonia is not excluded. 2. Hyperinflation/COPD. 3. Mild cardiomegaly. Electronically Signed   By: Margaretha Sheffield MD   On: 04/29/2020 12:41   ECHOCARDIOGRAM COMPLETE  Result Date: 05/08/2020    ECHOCARDIOGRAM REPORT   Patient Name:   [name] Date of Exam: 05/08/2020 Medical Rec #:  [id number]       Height:       62.0 in Accession #:    [id number]      Weight:       138.0 lb Date of Birth:  1928-02-09        BSA:          1.633 m Patient Age:    72 years        BP:           128/74 mmHg Patient Gender: F               HR:           92 bpm. Exam Location:  Inpatient Procedure: 2D Echo, Cardiac Doppler and Color Doppler Indications:    R94.31 Abnormal EKG  History:        Patient has no prior history of Echocardiogram examinations.                 COPD, Signs/Symptoms:Syncope; Risk Factors:Hypertension,                 Dyslipidemia and Diabetes.  Sonographer:    Bernadene Person RDCS Referring Phys: [phone]  VASUNDHRA RATHORE IMPRESSIONS  1. Septal motion is abnormal likely due to conduction abnormality. . Left ventricular ejection fraction, by estimation, is 60 to 65%. The left ventricle has normal function. The left ventricle has no regional wall motion abnormalities. There is mild concentric left ventricular hypertrophy. Left ventricular diastolic parameters are consistent with Grade I diastolic dysfunction (impaired relaxation).  2. Right ventricular systolic function is normal. The right ventricular size is normal. There is mildly elevated pulmonary artery systolic pressure.  3. The mitral valve is normal in structure. Trivial mitral valve regurgitation.  4. The aortic valve is tricuspid. There is mild calcification of the aortic valve. There is mild thickening of the aortic valve. Aortic valve regurgitation is trivial. Mild aortic valve sclerosis is present, with no evidence of aortic valve stenosis.  5. The inferior vena cava is normal in  size with greater than 50% respiratory variability, suggesting right atrial pressure of 3 mmHg. Comparison(s): No prior Echocardiogram. FINDINGS  Left Ventricle: Septal motion is abnormal likely due to conduction abnormality. Left ventricular ejection fraction, by estimation, is 60 to 65%. The left ventricle has normal function. The left ventricle has no regional wall motion abnormalities. The left ventricular internal cavity size was normal in size. There is mild concentric left ventricular hypertrophy. Left ventricular diastolic parameters are consistent with Grade I diastolic dysfunction (impaired relaxation). Right Ventricle: The right ventricular size is normal. No increase in right ventricular wall thickness. Right ventricular systolic function is normal. There is mildly elevated pulmonary artery systolic pressure. The tricuspid regurgitant velocity is 3.10  m/s, and with an assumed right atrial pressure of 3 mmHg, the estimated right ventricular systolic pressure is 91.6 mmHg. Left Atrium: Left atrial size was normal in size. Right Atrium: Right atrial size was normal in size. Pericardium: There is no evidence of pericardial effusion. Mitral Valve: The mitral valve is normal in structure. There is mild thickening of the mitral valve leaflet(s). Mild mitral annular calcification. Trivial mitral valve regurgitation. Tricuspid Valve: The tricuspid valve is normal in structure. Tricuspid valve regurgitation is mild. Aortic Valve: The aortic valve is tricuspid. There is mild calcification of the aortic valve. There is mild thickening of the aortic valve. Aortic valve regurgitation is trivial. Mild aortic valve sclerosis is present, with no evidence of aortic valve stenosis. Pulmonic Valve: The pulmonic valve was normal in structure. Pulmonic valve regurgitation is not visualized. Aorta: The aortic root and ascending aorta are structurally normal, with no evidence of dilitation. Venous: The inferior vena cava is  normal in size with greater than 50% respiratory variability, suggesting right atrial pressure of 3 mmHg. IAS/Shunts: No atrial level shunt detected by color flow Doppler.  LEFT VENTRICLE PLAX 2D LVIDd:         3.00 cm  Diastology LVIDs:         2.10 cm  LV e' medial:    4.68 cm/s LV PW:         1.20 cm  LV E/e' medial:  15.3 LV IVS:        1.20 cm  LV e' lateral:   5.22 cm/s LVOT diam:     1.80 cm  LV E/e' lateral: 13.7 LV SV:         74 LV SV Index:   46 LVOT Area:     2.54 cm  RIGHT VENTRICLE RV S prime:     11.80 cm/s TAPSE (M-mode): 1.9 cm LEFT ATRIUM           Index  RIGHT ATRIUM           Index LA diam:      1.90 cm 1.16 cm/m  RA Area:     12.70 cm LA Vol (A2C): 28.4 ml 17.39 ml/m RA Volume:   31.10 ml  19.05 ml/m LA Vol (A4C): 25.2 ml 15.43 ml/m  AORTIC VALVE LVOT Vmax:   156.00 cm/s LVOT Vmean:  113.000 cm/s LVOT VTI:    0.292 m  AORTA Ao Root diam: 2.90 cm Ao Asc diam:  3.00 cm MITRAL VALVE                TRICUSPID VALVE MV Area (PHT): 4.83 cm     TR Peak grad:   38.4 mmHg MV Decel Time: 157 msec     TR Vmax:        310.00 cm/s MV E velocity: 71.65 cm/s MV A velocity: 148.00 cm/s  SHUNTS MV E/A ratio:  0.48         Systemic VTI:  0.29 m                             Systemic Diam: 1.80 cm Gwyndolyn Kaufman MD Electronically signed by Gwyndolyn Kaufman MD Signature Date/Time: 05/08/2020/2:25:58 PM    Final    VAS [place] LOWER EXTREMITY VENOUS (DVT)  Result Date: 04/30/2020  Lower Venous DVTStudy Indications: Edema.  Risk Factors: Confirmed PE. Comparison Study: No prior studies. Performing Technologist: Darlin Coco  Examination Guidelines: A complete evaluation includes B-mode imaging, spectral Doppler, color Doppler, and power Doppler as needed of all accessible portions of each vessel. Bilateral testing is considered an integral part of a complete examination. Limited examinations for reoccurring indications may be performed as noted. The reflux portion of the exam is performed with the patient  in reverse Trendelenburg.  +---------+---------------+---------+-----------+----------+--------------+ RIGHT    CompressibilityPhasicitySpontaneityPropertiesThrombus Aging +---------+---------------+---------+-----------+----------+--------------+ CFV      Full           Yes      Yes                                 +---------+---------------+---------+-----------+----------+--------------+ SFJ      Full                                                        +---------+---------------+---------+-----------+----------+--------------+ FV Prox  Full                                                        +---------+---------------+---------+-----------+----------+--------------+ FV Mid   Full                                                        +---------+---------------+---------+-----------+----------+--------------+ FV DistalFull                                                        +---------+---------------+---------+-----------+----------+--------------+  PFV      Full                                                        +---------+---------------+---------+-----------+----------+--------------+ POP      None           No       No         Mobile    Acute          +---------+---------------+---------+-----------+----------+--------------+ PTV      Partial        No       No                   Acute          +---------+---------------+---------+-----------+----------+--------------+ PERO     None           No       No                   Acute          +---------+---------------+---------+-----------+----------+--------------+   +---------+---------------+---------+-----------+----------+--------------+ LEFT     CompressibilityPhasicitySpontaneityPropertiesThrombus Aging +---------+---------------+---------+-----------+----------+--------------+ CFV      Full           Yes      Yes                                  +---------+---------------+---------+-----------+----------+--------------+ SFJ      Full                                                        +---------+---------------+---------+-----------+----------+--------------+ FV Prox  Full                                                        +---------+---------------+---------+-----------+----------+--------------+ FV Mid   Full                                                        +---------+---------------+---------+-----------+----------+--------------+ FV DistalFull                                                        +---------+---------------+---------+-----------+----------+--------------+ PFV      Full                                                        +---------+---------------+---------+-----------+----------+--------------+ POP      Full  Yes      Yes                                 +---------+---------------+---------+-----------+----------+--------------+ PTV      Full                                                        +---------+---------------+---------+-----------+----------+--------------+ PERO     Partial        No       No                   Acute          +---------+---------------+---------+-----------+----------+--------------+     Summary: RIGHT: - Findings consistent with acute deep vein thrombosis involving the right popliteal vein, right posterior tibial veins, and right peroneal veins. - A cystic structure is found in the popliteal fossa.  LEFT: - Findings consistent with acute deep vein thrombosis involving the left peroneal veins. - No cystic structure found in the popliteal fossa.  *See table(s) above for measurements and observations. Electronically signed by Harold Barban MD on 04/30/2020 at 9:32:39 PM.    Final        Subjective: Feeling well.  No furhter bleeding, no confusion.  Still weak. No vomiting.    Discharge Exam: Vitals:   05/11/20 1624  05/11/20 2131  BP: 108/71 112/75  Pulse: 98 95  Resp: 18 16  Temp: 98.3 F (36.8 C) 98.1 F (36.7 C)  SpO2: 91% 92%   Vitals:   05/10/20 2319 05/11/20 0725 05/11/20 1624 05/11/20 2131  BP: 113/69 116/65 108/71 112/75  Pulse: (!) 101 89 98 95  Resp: 20 18 18 16   Temp: 98.6 F (37 C) 98.2 F (36.8 C) 98.3 F (36.8 C) 98.1 F (36.7 C)  TempSrc: Oral   Oral  SpO2: 96% 98% 91% 92%  Weight:      Height:        General: Pt is alert, awake, not in acute distress, lying in bed, slightly HOH Cardiovascular: RRR, nl S1-S2, no murmurs appreciated.   No LE edema.   Respiratory: Normal respiratory rate and rhythm.  CTAB without rales or wheezes. Abdominal: Abdomen soft and appropriately mildly ender without gaurding.  No distension or HSM.   Neuro/Psych: Strength symmetric in upper and lower extremities.  Judgment and insight appear normal.   The results of significant diagnostics from this hospitalization (including imaging, microbiology, ancillary and laboratory) are listed below for reference.     Microbiology: Recent Results (from the past 240 hour(s))  SARS CORONAVIRUS 2 (TAT 6-24 HRS) Nasopharyngeal Nasopharyngeal Swab     Status: None   Collection Time: 05/06/20  9:18 AM   Specimen: Nasopharyngeal Swab  Result Value Ref Range Status   SARS Coronavirus 2 NEGATIVE NEGATIVE Final    Comment: (NOTE) SARS-CoV-2 target nucleic acids are NOT DETECTED.  The SARS-CoV-2 RNA is generally detectable in upper and lower respiratory specimens during the acute phase of infection. Negative results do not preclude SARS-CoV-2 infection, do not rule out co-infections with other pathogens, and should not be used as the sole basis for treatment or other patient management decisions. Negative results must be combined with clinical observations, patient history, and epidemiological information. The expected result is Negative.  Fact Sheet for  Patients: SugarRoll.be  Fact Sheet for Healthcare Providers: https://www.woods-mathews.com/  This test is not yet approved or cleared by the [place] FDA and  has been authorized for detection and/or diagnosis of SARS-CoV-2 by FDA under an Emergency Use Authorization (EUA). This EUA will remain  in effect (meaning this test can be used) for the duration of the COVID-19 declaration under Se ction 564(b)(1) of the Act, 21 U.S.C. section 360bbb-3(b)(1), unless the authorization is terminated or revoked sooner.  Performed at [facility], [place] [address], [place], [place] [address]   Respiratory Panel by RT PCR (Flu A&B, Covid) - Nasopharyngeal Swab     Status: None   Collection Time: 05/08/20  4:36 AM   Specimen: Nasopharyngeal Swab  Result Value Ref Range Status   SARS Coronavirus 2 by RT PCR NEGATIVE NEGATIVE Final    Comment: (NOTE) SARS-CoV-2 target nucleic acids are NOT DETECTED.  The SARS-CoV-2 RNA is generally detectable in upper respiratoy specimens during the acute phase of infection. The lowest concentration of SARS-CoV-2 viral copies this assay can detect is 131 copies/mL. A negative result does not preclude SARS-Cov-2 infection and should not be used as the sole basis for treatment or other patient management decisions. A negative result may occur with  improper specimen collection/handling, submission of specimen other than nasopharyngeal swab, presence of viral mutation(s) within the areas targeted by this assay, and inadequate number of viral copies (<131 copies/mL). A negative result must be combined with clinical observations, patient history, and epidemiological information. The expected result is Negative.  Fact Sheet for Patients:  PinkCheek.be  Fact Sheet for Healthcare Providers:  GravelBags.it  This test is no t yet approved or cleared by the  [place] FDA and  has been authorized for detection and/or diagnosis of SARS-CoV-2 by FDA under an Emergency Use Authorization (EUA). This EUA will remain  in effect (meaning this test can be used) for the duration of the COVID-19 declaration under Section 564(b)(1) of the Act, 21 U.S.C. section 360bbb-3(b)(1), unless the authorization is terminated or revoked sooner.     Influenza A by PCR NEGATIVE NEGATIVE Final   Influenza B by PCR NEGATIVE NEGATIVE Final    Comment: (NOTE) The Xpert Xpress SARS-CoV-2/FLU/RSV assay is intended as an aid in  the diagnosis of influenza from Nasopharyngeal swab specimens and  should not be used as a sole basis for treatment. Nasal washings and  aspirates are unacceptable for Xpert Xpress SARS-CoV-2/FLU/RSV  testing.  Fact Sheet for Patients: PinkCheek.be  Fact Sheet for Healthcare Providers: GravelBags.it  This test is not yet approved or cleared by the [place] FDA and  has been authorized for detection and/or diagnosis of SARS-CoV-2 by  FDA under an Emergency Use Authorization (EUA). This EUA will remain  in effect (meaning this test can be used) for the duration of the  Covid-19 declaration under Section 564(b)(1) of the Act, 21  U.S.C. section 360bbb-3(b)(1), unless the authorization is  terminated or revoked. Performed at [facility], [place] [address], [place], [place] [address]      Labs: BNP (last 3 results) No results for input(s): BNP in the last 8760 hours. Basic Metabolic Panel: Recent Labs  Lab 05/08/20 0408 05/08/20 0812 05/12/20 0330  NA 139  --  141  K 4.1  --  4.1  CL 98  --  102  CO2 34*  --  33*  GLUCOSE 106*  --  103*  BUN 7*  --  11  CREATININE 0.50  --  0.61  CALCIUM 8.7*  --  8.6*  MG  --  1.7  --    Liver Function Tests: Recent Labs  Lab 05/08/20 0408  AST 18  ALT 11  ALKPHOS 31*  BILITOT 0.7  PROT 4.4*  ALBUMIN 2.1*   No  results for input(s): LIPASE, AMYLASE in the last 168 hours. No results for input(s): AMMONIA in the last 168 hours. CBC: Recent Labs  Lab 05/07/20 0411 05/07/20 0411 05/08/20 0408 05/08/20 0388 05/09/20 0147 05/09/20 1543 05/10/20 0128 05/11/20 0152 05/12/20 0330  WBC 8.6  --  8.8  --   --   --  7.4 7.3 6.8  HGB 10.5*   < > 9.9*   < > 7.3* 8.9* 8.6* 8.4* 8.5*  HCT 33.1*   < > 31.1*   < > 23.1* 27.7* 26.4* 26.5* 27.0*  MCV 95.7  --  96.9  --   --   --  95.3 96.0 97.1  PLT 306  --  336  --   --   --  291 260 254   < > = values in this interval not displayed.   Cardiac Enzymes: No results for input(s): CKTOTAL, CKMB, CKMBINDEX, TROPONINI in the last 168 hours. BNP: Invalid input(s): POCBNP CBG: No results for input(s): GLUCAP in the last 168 hours. D-Dimer No results for input(s): DDIMER in the last 72 hours. Hgb A1c No results for input(s): HGBA1C in the last 72 hours. Lipid Profile No results for input(s): CHOL, HDL, LDLCALC, TRIG, CHOLHDL, LDLDIRECT in the last 72 hours. Thyroid function studies No results for input(s): TSH, T4TOTAL, T3FREE, THYROIDAB in the last 72 hours.  Invalid input(s): FREET3 Anemia work up No results for input(s): VITAMINB12, FOLATE, FERRITIN, TIBC, IRON, RETICCTPCT in the last 72 hours. Urinalysis    Component Value Date/Time   BILIRUBINUR 1 12/26/2014 1628   PROTEINUR 100 12/26/2014 1628   UROBILINOGEN 0.2 12/26/2014 1628   NITRITE Neg 12/26/2014 1628   LEUKOCYTESUR Trace 12/26/2014 1628   Sepsis Labs Invalid input(s): PROCALCITONIN,  WBC,  LACTICIDVEN Microbiology Recent Results (from the past 240 hour(s))  SARS CORONAVIRUS 2 (TAT 6-24 HRS) Nasopharyngeal Nasopharyngeal Swab     Status: None   Collection Time: 05/06/20  9:18 AM   Specimen: Nasopharyngeal Swab  Result Value Ref Range Status   SARS Coronavirus 2 NEGATIVE NEGATIVE Final    Comment: (NOTE) SARS-CoV-2 target nucleic acids are NOT DETECTED.  The SARS-CoV-2 RNA is  generally detectable in upper and lower respiratory specimens during the acute phase of infection. Negative results do not preclude SARS-CoV-2 infection, do not rule out co-infections with other pathogens, and should not be used as the sole basis for treatment or other patient management decisions. Negative results must be combined with clinical observations, patient history, and epidemiological information. The expected result is Negative.  Fact Sheet for Patients: SugarRoll.be  Fact Sheet for Healthcare Providers: https://www.woods-mathews.com/  This test is not yet approved or cleared by the [place] FDA and  has been authorized for detection and/or diagnosis of SARS-CoV-2 by FDA under an Emergency Use Authorization (EUA). This EUA will remain  in effect (meaning this test can be used) for the duration of the COVID-19 declaration under Se ction 564(b)(1) of the Act, 21 U.S.C. section 360bbb-3(b)(1), unless the authorization is terminated or revoked sooner.  Performed at [facility], [place] [address] [place], [place] [address]   Respiratory Panel by RT PCR (Flu A&B,  Covid) - Nasopharyngeal Swab     Status: None   Collection Time: 05/08/20  4:36 AM   Specimen: Nasopharyngeal Swab  Result Value Ref Range Status   SARS Coronavirus 2 by RT PCR NEGATIVE NEGATIVE Final    Comment: (NOTE) SARS-CoV-2 target nucleic acids are NOT DETECTED.  The SARS-CoV-2 RNA is generally detectable in upper respiratoy specimens during the acute phase of infection. The lowest concentration of SARS-CoV-2 viral copies this assay can detect is 131 copies/mL. A negative result does not preclude SARS-Cov-2 infection and should not be used as the sole basis for treatment or other patient management decisions. A negative result may occur with  improper specimen collection/handling, submission of specimen other than nasopharyngeal swab, presence of viral  mutation(s) within the areas targeted by this assay, and inadequate number of viral copies (<131 copies/mL). A negative result must be combined with clinical observations, patient history, and epidemiological information. The expected result is Negative.  Fact Sheet for Patients:  PinkCheek.be  Fact Sheet for Healthcare Providers:  GravelBags.it  This test is no t yet approved or cleared by the [place] FDA and  has been authorized for detection and/or diagnosis of SARS-CoV-2 by FDA under an Emergency Use Authorization (EUA). This EUA will remain  in effect (meaning this test can be used) for the duration of the COVID-19 declaration under Section 564(b)(1) of the Act, 21 U.S.C. section 360bbb-3(b)(1), unless the authorization is terminated or revoked sooner.     Influenza A by PCR NEGATIVE NEGATIVE Final   Influenza B by PCR NEGATIVE NEGATIVE Final    Comment: (NOTE) The Xpert Xpress SARS-CoV-2/FLU/RSV assay is intended as an aid in  the diagnosis of influenza from Nasopharyngeal swab specimens and  should not be used as a sole basis for treatment. Nasal washings and  aspirates are unacceptable for Xpert Xpress SARS-CoV-2/FLU/RSV  testing.  Fact Sheet for Patients: PinkCheek.be  Fact Sheet for Healthcare Providers: GravelBags.it  This test is not yet approved or cleared by the [place] FDA and  has been authorized for detection and/or diagnosis of SARS-CoV-2 by  FDA under an Emergency Use Authorization (EUA). This EUA will remain  in effect (meaning this test can be used) for the duration of the  Covid-19 declaration under Section 564(b)(1) of the Act, 21  U.S.C. section 360bbb-3(b)(1), unless the authorization is  terminated or revoked. Performed at [facility], [place] [address] [place], [place] [address]      Time coordinating discharge:  25 minutes The [place] controlled substances registry was reviewed for this patient prior to filling the <5 days supply controlled substances script.      SIGNED:   Edwin Dada, MD  [facility] 05/12/2020, 8:10 AM

## 2020-05-12 NOTE — NC FL2 (Signed)
[place] LEVEL OF CARE SCREENING TOOL     IDENTIFICATION  Patient Name: Ariel Medina Birthdate: [birth year] Sex: female Admission Date (Current Location): 05/08/2020  [facility] and [place] Number:  Herbalist and Address:  [facility] [place], [place] [address], [place], [place] [address]      Provider Number: [id number]  Attending Physician Name and Address:  Edwin Dada, *  Relative Name and Phone Number:  Dorothy Spark [phone]  [phone]    Current Level of Care: Hospital Recommended Level of Care: [place] Prior Approval Number:    Date Approved/Denied:   PASRR Number: [id number]  Discharge Plan: SNF    Current Diagnoses: Patient Active Problem List   Diagnosis Date Noted  . Goals of care, counseling/discussion   . Palliative care by specialist   . DNR (do not resuscitate) discussion   . Intussusception ([place]) 04/29/2020  . Pulmonary embolism, bilateral ([place]) 04/29/2020  . GI bleed 04/29/2020  . Abnormal CT scan of lung 04/29/2020  . Hypoxia 04/29/2020  . Acute pulmonary embolism ([place]) 04/29/2020  . Pre-ulcerative calluses 01/25/2020  . Essential hypertension 11/29/2016  . Closed fracture of multiple pubic rami ([place]) 11/29/2016  . Fall   . Closed fracture of left pubis ([place]) 11/28/2016  . Hypokalemia 11/28/2016  . Hearing loss 09/19/2015  . PCP NOTES >>> 05/13/2015  . Allergic rhinitis 10/24/2014  . Annual physical exam 04/14/2011  . BACK PAIN, shoulder pain 12/11/2007  . Osteoporosis 10/20/2007  . NEOPLASM, MALIGNANT, BREAST, HX OF 10/20/2007  . Hypothyroidism 01/30/2007  . Hyperlipidemia 01/30/2007  . HTN and mild edema 01/30/2007  . OSTEOARTHRITIS 01/30/2007    Orientation RESPIRATION BLADDER Height & Weight     Self, Time, Situation, Place  Normal Incontinent Weight: 138 lb 10.7 oz (62.9 kg) Height:  5\' 2"  (157.5 cm)  BEHAVIORAL SYMPTOMS/MOOD  NEUROLOGICAL BOWEL NUTRITION STATUS      Incontinent Diet (Soft.  See discharge summary)  AMBULATORY STATUS COMMUNICATION OF NEEDS Skin   Supervision Verbally Normal                       Personal Care Assistance Level of Assistance  Bathing, Feeding, Dressing Bathing Assistance: Maximum assistance Feeding assistance: Limited assistance Dressing Assistance: Maximum assistance     Functional Limitations Info  Sight, Speech Sight Info: Adequate Hearing Info: Impaired Speech Info: Adequate    SPECIAL CARE FACTORS FREQUENCY  PT (By licensed PT), OT (By licensed OT)     PT Frequency: 5x week OT Frequency: 5x week            Contractures Contractures Info: Not present    Additional Factors Info  Code Status Code Status Info: full Allergies Info: ibuprofin           Current Medications (05/12/2020):  This is the current hospital active medication list Current Facility-Administered Medications  Medication Dose Route Frequency Provider Last Rate Last Admin  . acetaminophen (TYLENOL) tablet 650 mg  650 mg Oral Q6H PRN Danford, Suann Larry, MD      . apixaban (ELIQUIS) tablet 5 mg  5 mg Oral BID Edwin Dada, MD   5 mg at 05/11/20 2221  . feeding supplement (ENSURE ENLIVE) (ENSURE ENLIVE) liquid 237 mL  237 mL Oral BID BM Caren Griffins, MD   237 mL at 05/11/20 1545  . fluticasone (FLONASE) 50 MCG/ACT nasal spray 2 spray  2 spray Each Nare Daily PRN Gherghe,  Costin M, MD      . gabapentin (NEURONTIN) capsule 300 mg  300 mg Oral BID Caren Griffins, MD   300 mg at 05/11/20 2221  . levothyroxine (SYNTHROID) tablet 75 mcg  75 mcg Oral QAC breakfast Caren Griffins, MD   75 mcg at 05/12/20 2505  . oxyCODONE (Oxy IR/ROXICODONE) immediate release tablet 5 mg  5 mg Oral Q6H PRN Caren Griffins, MD      . psyllium (HYDROCIL/METAMUCIL) 1 packet  1 packet Oral Daily Caren Griffins, MD   1 packet at 05/11/20 [phone]  . rosuvastatin (CRESTOR) tablet 5 mg  5 mg  Oral Once per day on Mon Wed Fri Gherghe, Costin M, MD   5 mg at 05/09/20 7341     Discharge Medications: Please see discharge summary for a list of discharge medications.  Relevant Imaging Results:  Relevant Lab Results:   Additional Information ssn:[id number]  Joanne Chars, LCSW

## 2020-05-13 ENCOUNTER — Encounter: Payer: Self-pay | Admitting: Internal Medicine

## 2020-05-13 ENCOUNTER — Non-Acute Institutional Stay (SKILLED_NURSING_FACILITY): Payer: Medicare HMO | Admitting: Internal Medicine

## 2020-05-13 DIAGNOSIS — E785 Hyperlipidemia, unspecified: Secondary | ICD-10-CM | POA: Diagnosis not present

## 2020-05-13 DIAGNOSIS — Z789 Other specified health status: Secondary | ICD-10-CM

## 2020-05-13 DIAGNOSIS — I2699 Other pulmonary embolism without acute cor pulmonale: Secondary | ICD-10-CM

## 2020-05-13 DIAGNOSIS — K561 Intussusception: Secondary | ICD-10-CM | POA: Diagnosis not present

## 2020-05-13 DIAGNOSIS — M8000XS Age-related osteoporosis with current pathological fracture, unspecified site, sequela: Secondary | ICD-10-CM | POA: Diagnosis not present

## 2020-05-13 DIAGNOSIS — K922 Gastrointestinal hemorrhage, unspecified: Secondary | ICD-10-CM | POA: Diagnosis not present

## 2020-05-13 DIAGNOSIS — E039 Hypothyroidism, unspecified: Secondary | ICD-10-CM | POA: Diagnosis not present

## 2020-05-13 DIAGNOSIS — I10 Essential (primary) hypertension: Secondary | ICD-10-CM

## 2020-05-13 DIAGNOSIS — R918 Other nonspecific abnormal finding of lung field: Secondary | ICD-10-CM

## 2020-05-13 NOTE — Progress Notes (Signed)
Provider:  Rexene Edison. Mariea Clonts, D.O., C.M.D. Location:  [place] Room Number: [place] of Service:  SNF (31)  PCP: Colon Branch, MD Patient Care Team: Colon Branch, MD as PCP - General Minus Breeding, MD as PCP - Cardiology (Cardiology) Thornell Sartorius, MD as Consulting Physician (Otolaryngology) Gardiner Barefoot, DPM as Consulting Physician (Podiatry) Day, Melvenia Beam, [facility] as Pharmacist (Pharmacist)  Extended Emergency Contact Information Primary Emergency Contact: [place] Phone: [phone] Mobile Phone: [phone] Relation: Granddaughter  Code Status: FULL CODE Goals of Care: Advanced Directive information Advanced Directives 05/13/2020  Does Patient Have a Medical Advance Directive? Yes  Type of Advance Directive Living will;Healthcare Power of Attorney  Does patient want to make changes to medical advance directive? No - Patient declined  Copy of [place] in Chart? No - copy requested   Chief Complaint  Patient presents with  . New Admit To SNF    Rehab admission s/p hospitalizations for intussusception and hemicolectomy, then GI bleed    HPI: Patient is a 86 y.o. female seen today for readmission to [facility] s/p second hospitalization.  Ariel Medina was first hospitalized from 9/21-9/29 with intussusception that required an open right hemicolectomy on 9/23.  She was also found to have bilateral PEs and DVTs and started on eliquis.  She then came here and overnight her first night developed rectal bleeding and was sent back to the hospital where she stayed from 9/30-10/4.    Ariel Medina has a medical history significant for breast cancer, diverticulitis, hypertension, hyperlipidemia, hypothyroidism, osteoarthritis, osteopenia, chronic kidney disease.  She had presented to the emergency department with complaints of worsening diarrhea for the past 2 weeks and some bright red stool plus decreased  appetite.  She had been seen in her PCP office for evaluation and had negative stool cultures and labs at that time.  She returned again when she had heme positive stools and was also noted to be hypoxic.  In the ED she was found to have bilateral pulmonary emboli on the CT angiogram.  She also had a left upper lobe opacity that could represent focal inflammation versus an underlying neoplasm.  She was started on empiric antibiotics for possible pneumonia.  Initially she was not placed on anticoagulation due to her hematochezia.  CT of the abdomen revealed intussusception of her terminal ileum and right-sided colon.  General surgery and gastroenterology were consulted.  Regarding the intussusception, she underwent open extended right hemicolectomy on September 23 by Dr. Coralie Keens.  The time of her first discharge she was on a soft diet and had tolerated it well.  Pathology showed a submucosal lipoma at the lead point of the intussusception, unremarkable appendix and for unremarkable lymph nodes.  Acute hypoxic respiratory failure secondary to bilateral PE: CT of the chest showed a 16 x 11 mm irregular solid density in her left upper lobe.  She completed empiric antibiotics for 5 days for possible pneumonia.  Ultrasound of the lower extremity also revealed acute DVT in the right popliteal veins right posterior tibial vein and right peroneal vein as well as the left peroneal vein.  She was started on Eliquis prior to discharge and that would be needed for at least a 60-month course.  A repeat CT scan in 3 to 4 weeks is recommended to ensure resolution of the opacity in the lung.  She had electrolyte disturbances with hypokalemia hypomagnesemia and hypophosphatemia that were repleted.  For her hypertension her HCTZ was initially held but then restarted at discharge.  As mentioned above almost immediately upon arrival at [facility], she developed significant rectal bleeding.  She was sent to the  emergency department where her hemoglobin was 9.9 but she had active bleeding.  She was given tranexamic acid/kcentra only and admitted.  Eliquis was held and general surgery was consulted.  She was suspected to have an a stenotic bleeding which resolved after about 24 h.  Anticoagulation was restarted cautiously with heparin.  She had no further bleeding and had a [place] bowel movement so her Eliquis was then restarted and she was monitored for 24 h without recurrence of bleeding before she was sent back to [place].  Of note she was transfused 1 unit of packed red cells on hospital day one.  Her hemoglobin remained stable.  At that point she was actually taken back off the hydrochlorothiazide and blood pressures were not requiring it.  She came to [place] with Tylenol and oxycodone for pain.  She is to follow-up with Dr. Coralie Keens of general surgery on October 19 at 2:10 PM and then again for staple removal October 7 at 10 AM.  She had her COVID vaccines from [employer] on February 21 and March 17.  When seen, she was feeling weak and fatigued.  She reported only having pain with movement and stretching.  She denied shortness of breath.  She had had a normal bowel movement here as well.  She is concerned about her overall status and her ability to get strong enough to go back home.    Past Medical History:  Diagnosis Date  . Anemia   . Breast CA ([place])    left breast-invasive ductal ca stage 1-stopped arimidex 2008  . Diverticulitis of colon   . GI bleed 04/2020  . Hiatal hernia   . Hyperlipidemia   . Hypertension   . Hypothyroidism   . Osteoarthritis   . Osteopenia    DEXA 7/02  . Syncope 2005   CT head (-), ECHO essent. neg, stress test (-), carotid u/s (-)   Past Surgical History:  Procedure Laterality Date  . BREAST BIOPSY Bilateral   . BREAST LUMPECTOMY  04/2002   left  . PARTIAL COLECTOMY N/A 05/01/2020   Procedure: PARTIAL COLECTOMY;  Surgeon: Coralie Keens, MD;  Location: [facility];  Service: General;  Laterality: N/A;  . TONSILLECTOMY AND ADENOIDECTOMY      Social History   Socioeconomic History  . Marital status: Widowed    Spouse name: Not on file  . Number of children: 2  . Years of education: Not on file  . Highest education level: Not on file  Occupational History  . Occupation: n/a    Employer: RETIRED  Tobacco Use  . Smoking status: Never Smoker  . Smokeless tobacco: Never Used  Vaping Use  . Vaping Use: Never used  Substance and Sexual Activity  . Alcohol use: No  . Drug use: No  . Sexual activity: Not Currently  Other Topics Concern  . Not on file  Social History Narrative   Lost a son 75   G-son  live w/ her from time to time, not permanently   ([name] is her son and used to be my pt,  h/o etoh)   Sister live next door , does help the pt    G-son @ [employer] , [place] @ [employer] since 1993   still drives,  short distances, typically not at night.   Social Determinants of Health   Financial Resource Strain: Low Risk   . Difficulty of Paying Living Expenses: Not hard at all  Food Insecurity: No Food Insecurity  . Worried About Charity fundraiser in the Last Year: Never true  . Ran Out of Food in the Last Year: Never true  Transportation Needs: No Transportation Needs  . Lack of Transportation (Medical): No  . Lack of Transportation (Non-Medical): No  Physical Activity:   . Days of Exercise per Week: Not on file  . Minutes of Exercise per Session: Not on file  Stress: No Stress Concern Present  . Feeling of Stress : Only a little  Social Connections:   . Frequency of Communication with Friends and Family: Not on file  . Frequency of Social Gatherings with Friends and Family: Not on file  . Attends Religious Services: Not on file  . Active Member of Clubs or Organizations: Not on file  . Attends Archivist Meetings: Not on file  . Marital Status: Not on file    reports that she has never smoked.  She has never used smokeless tobacco. She reports that she does not drink alcohol and does not use drugs.  Functional Status Survey:    Family History  Problem Relation Age of Onset  . Heart attack Brother 75  . Diabetes Mother   . Stroke Father 55  . Stroke Sister 76  . Colon cancer Neg Hx   . Breast cancer Neg Hx     Health Maintenance  Topic Date Due  . INFLUENZA VACCINE  03/09/2020  . MAMMOGRAM  06/25/2021  . TETANUS/TDAP  05/25/2028  . DEXA SCAN  Completed  . COVID-19 Vaccine  Completed  . PNA vac Low Risk Adult  Completed    Allergies  Allergen Reactions  . Ibuprofen     REACTION: bleed    Outpatient Encounter Medications as of 05/13/2020  Medication Sig  . acetaminophen (TYLENOL) 325 MG tablet Take 2 tablets (650 mg total) by mouth every 6 (six) hours as needed for mild pain.  Marland Kitchen apixaban (ELIQUIS) 5 MG TABS tablet Take 1 tablet (5 mg total) by mouth 2 (two) times daily.  . bisacodyl (DULCOLAX) 10 MG suppository Place 10 mg rectally as needed for moderate constipation.  . feeding supplement, ENSURE ENLIVE, (ENSURE ENLIVE) LIQD Take 237 mLs by mouth 2 (two) times daily between meals.  . fluticasone (FLONASE) 50 MCG/ACT nasal spray Place 2 sprays into both nostrils daily as needed for allergies or rhinitis.  Marland Kitchen gabapentin (NEURONTIN) 300 MG capsule Take 1 capsule (300 mg total) by mouth 2 (two) times daily.  Marland Kitchen levothyroxine (SYNTHROID) 75 MCG tablet Take 1 tablet (75 mcg total) by mouth daily before breakfast.  . Multiple Vitamins-Minerals (ICAPS AREDS 2 PO) Take 1 capsule by mouth 2 (two) times daily.  Marland Kitchen oxyCODONE (OXY IR/ROXICODONE) 5 MG immediate release tablet Take 1 tablet (5 mg total) by mouth every 6 (six) hours as needed for moderate pain or severe pain.  . polyethylene glycol (MIRALAX / GLYCOLAX) packet Take 17 g by mouth daily as needed for moderate constipation.  . potassium chloride (K-DUR,KLOR-CON) 10 MEQ tablet Take 1 tablet (10 mEq total) by mouth daily.   . psyllium (HYDROCIL/METAMUCIL) 95 % PACK Take 1 packet by mouth daily. Hold for diarrhoea  . rosuvastatin (CRESTOR) 5 MG tablet Take 1 tablet (5 mg total) by mouth 3 (three) times a week. No specific  days  . [DISCONTINUED] bisacodyl (DULCOLAX) 10 MG suppository Place 10 mg rectally once as needed for moderate constipation (if no results with MOM).  . [DISCONTINUED] magnesium hydroxide (MILK OF MAGNESIA) 400 MG/5ML suspension Take 30 mLs by mouth once as needed for mild constipation (if no BM in 3 days).   No facility-administered encounter medications on file as of 05/13/2020.    Review of Systems  Constitutional: Negative for chills and fever.  HENT: Negative for congestion.   Eyes: Negative for blurred vision.  Respiratory: Negative for cough and shortness of breath.   Cardiovascular: Negative for chest pain, palpitations and leg swelling.  Gastrointestinal: Negative for abdominal pain, blood in stool, constipation, diarrhea and melena.       No further bleeding   Genitourinary: Negative for dysuria.  Musculoskeletal: Negative for falls and joint pain.  Skin: Negative for itching and rash.  Neurological: Positive for weakness. Negative for dizziness and loss of consciousness.  Endo/Heme/Allergies: Bruises/bleeds easily.  Psychiatric/Behavioral: Negative for depression and memory loss. The patient is not nervous/anxious and does not have insomnia.        Somewhat down, but looking to her faith for guidance    Vitals:   05/13/20 0921  BP: 124/68  Pulse: 90  Temp: 97.7 F (36.5 C)  Weight: 138 lb 10.7 oz (62.9 kg)  Height: 5\' 2"  (1.575 m)   Body mass index is 25.36 kg/m. Physical Exam Vitals reviewed.  Constitutional:      General: She is not in acute distress.    Appearance: Normal appearance. She is not toxic-appearing.     Comments: Appearing frail now  HENT:     Head: Normocephalic and atraumatic.     Right Ear: External ear normal.     Left Ear: External ear normal.      Nose: Nose normal.     Mouth/Throat:     Pharynx: Oropharynx is clear.  Eyes:     Extraocular Movements: Extraocular movements intact.     Pupils: Pupils are equal, round, and reactive to light.  Cardiovascular:     Rate and Rhythm: Normal rate and regular rhythm.     Heart sounds: Murmur heard.   Pulmonary:     Effort: Pulmonary effort is normal.     Breath sounds: Normal breath sounds. No wheezing, rhonchi or rales.  Abdominal:     General: Bowel sounds are normal. There is no distension.     Palpations: Abdomen is soft. There is no mass.     Tenderness: There is no abdominal tenderness. There is no guarding or rebound.     Comments: Staples intact and no drainage from surgical site  Musculoskeletal:        General: Normal range of motion.     Right lower leg: No edema.     Left lower leg: No edema.     Comments: Used walker at baseline  Skin:    General: Skin is warm and dry.  Neurological:     General: No focal deficit present.     Mental Status: She is alert and oriented to person, place, and time.     Cranial Nerves: No cranial nerve deficit.     Sensory: No sensory deficit.     Motor: Weakness present.     Coordination: Coordination normal.     Deep Tendon Reflexes: Reflexes normal.  Psychiatric:        Mood and Affect: Mood normal.     Labs reviewed: Basic Metabolic Panel: Recent  Labs    05/02/20 0250 05/02/20 0250 05/03/20 0141 05/03/20 0141 05/04/20 0301 05/04/20 0301 05/05/20 0327 05/08/20 0408 05/08/20 0812 05/12/20 0330  NA 136   < > 134*   < > 137   < > 139 139  --  141  K 3.5   < > 4.1   < > 3.8   < > 4.1 4.1  --  4.1  CL 99   < > 99   < > 102   < > 103 98  --  102  CO2 29   < > 28   < > 28   < > 30 34*  --  33*  GLUCOSE 118*   < > 122*   < > 118*   < > 101* 106*  --  103*  BUN <5*   < > <5*   < > 7*   < > <5* 7*  --  11  CREATININE 0.59   < > 0.52   < > 0.54   < > 0.50 0.50  --  0.61  CALCIUM 8.0*   < > 8.1*   < > 8.4*   < > 8.5* 8.7*   --  8.6*  MG 1.6*   < > 2.0   < > 1.9  --  1.7  --  1.7  --   PHOS 2.4*  --  4.1  --   --   --  2.7  --   --   --    < > = values in this interval not displayed.   Liver Function Tests: Recent Labs    05/03/20 0141 05/05/20 0327 05/08/20 0408  AST 15 14* 18  ALT 7 8 11   ALKPHOS 33* 27* 31*  BILITOT 0.5 0.5 0.7  PROT 4.2* 3.9* 4.4*  ALBUMIN 2.0* 1.8* 2.1*   Recent Labs    04/25/20 1620 04/29/20 1204  LIPASE 8 24   No results for input(s): AMMONIA in the last 8760 hours. CBC: Recent Labs    06/26/19 0954 06/26/19 0954 04/25/20 1620 04/25/20 1620 04/29/20 1204 04/30/20 0046 05/10/20 0128 05/11/20 0152 05/12/20 0330  WBC 5.6   < > 7.5   < > 9.4   < > 7.4 7.3 6.8  NEUTROABS 3.8  --  5,190  --  7.3  --   --   --   --   HGB 15.2*   < > 15.6*   < > 15.7*   < > 8.6* 8.4* 8.5*  HCT 44.9   < > 45.9*   < > 47.2*   < > 26.4* 26.5* 27.0*  MCV 95.3   < > 91.6   < > 93.1   < > 95.3 96.0 97.1  PLT 234.0   < > 257   < > 289   < > 291 260 254   < > = values in this interval not displayed.   Cardiac Enzymes: No results for input(s): CKTOTAL, CKMB, CKMBINDEX, TROPONINI in the last 8760 hours. BNP: Invalid input(s): POCBNP No results found for: HGBA1C Lab Results  Component Value Date   TSH 4.604 (H) 05/01/2020   No results found for: VITAMINB12 No results found for: FOLATE No results found for: IRON, TIBC, FERRITIN  Imaging and Procedures obtained prior to SNF admission: CT ABDOMEN PELVIS WO CONTRAST  Result Date: 05/08/2020 CLINICAL DATA:  Recent partial colectomy.  Gastrointestinal bleeding EXAM: CT ABDOMEN AND PELVIS WITHOUT CONTRAST TECHNIQUE: Multidetector CT imaging of the abdomen and  pelvis was performed following the standard protocol without oral or IV contrast. COMPARISON:  April 29, 2020 FINDINGS: Lower chest: There are pleural effusions bilaterally. There is compressive atelectasis in the lung bases with suspected superimposed degree of pneumonia in the  posterior left base. There is a sizable hiatal type hernia present. There is a suspected lipoma within the hiatal hernia measuring 7 x 7 mm. Hepatobiliary: No focal liver lesions are evident on this noncontrast enhanced study. The gallbladder wall is not appreciably thickened. There is no biliary duct dilatation. Pancreas: There is a partially calcified apparent mass arising along the rightward aspect of the head of the pancreas eccentrically measuring 1.7 x 1.7 x 1.7 cm. No other pancreatic lesion evident. Spleen: No splenic lesions are appreciable. Adrenals/Urinary Tract: Adrenals bilaterally appear normal. There is a cyst arising from the posterior mid right kidney measuring 1.7 x 1.7 cm. There is a cyst lobulated cyst arising from the lower pole the left kidney laterally measuring 2.6 x 2.5 cm. There is no appreciable hydronephrosis on either side. There is no appreciable renal or ureteral calculus on either side. Urinary bladder is midline with wall thickness within normal limits. Stomach/Bowel: Postoperative changes noted in the transverse colon region proximally with patent anastomoses. No fluid collection or extraluminal air seen in this area of recent surgery. Slight soft tissue stranding in this area is likely of postoperative etiology. There are multiple sigmoid diverticula without demonstrable diverticulitis. There is no demonstrable bowel obstruction. The terminal ileum region appears normal. There is no demonstrable free air or portal venous air. Vascular/Lymphatic: No abdominal aortic aneurysm. There is aortic and iliac artery atherosclerosis. There is a left retroaortic renal vein, an anatomic variant. No adenopathy evident in the abdomen or pelvis. Reproductive: Uterus is anteverted.  There is no adnexal mass. Other: There is no appreciable periappendiceal region inflammation. No abscess or ascites is evident in the abdomen or pelvis. Musculoskeletal: Bones are osteoporotic. There is degenerative  change in each hip joint. There is also degenerative change in the lumbar spine. No blastic or lytic bone lesions. There is no intramuscular or abdominal wall lesion evident. IMPRESSION: 1. Status post removal of previous lesion in the colon near the hepatic flexure involving the proximal transverse colon. Anastomosis patent in this area. Slight soft tissue stranding in this area is felt to be due to recent surgery. There is no evidence suggesting perforation or abscess. No fluid is seen surrounding this area of recent surgery. 2.  Sigmoid diverticula without diverticulitis. 3. Pleural effusions bilaterally. Compressive atelectasis in the lung bases with suspected focus of pneumonia in the posterior left base. 4. Sizable hiatal hernia. Small lipoma noted within the rightward aspect of this hiatal hernia. 5.  No bowel obstruction.  No abscess in the abdomen or pelvis. 6. 1.7 x 1.7 x 1.7 cm partially calcified mass arising eccentrically from the pancreatic head. Significance of this finding in this age group is uncertain. Particular attention this area on subsequent evaluations is warranted. Pancreas otherwise appears normal. 7.  Aortic Atherosclerosis (ICD10-I70.0). Comment: A site for gastrointestinal bleeding has not been established with this study. If bleeding continues, nuclear medicine gastrointestinal bleeding study may be helpful for further assessment and potential localization of bleeding site. Electronically Signed   By: Lowella Grip III M.D.   On: 05/08/2020 08:19   ECHOCARDIOGRAM COMPLETE  Result Date: 05/08/2020    ECHOCARDIOGRAM REPORT   Patient Name:   Ariel Medina Date of Exam: 05/08/2020 Medical Rec #:  [id number]  Height:       62.0 in Accession #:    [id number]      Weight:       138.0 lb Date of Birth:  12/04/1927        BSA:          1.633 m Patient Age:    88 years        BP:           128/74 mmHg Patient Gender: F               HR:           92 bpm. Exam Location:  Inpatient  Procedure: 2D Echo, Cardiac Doppler and Color Doppler Indications:    R94.31 Abnormal EKG  History:        Patient has no prior history of Echocardiogram examinations.                 COPD, Signs/Symptoms:Syncope; Risk Factors:Hypertension,                 Dyslipidemia and Diabetes.  Sonographer:    Bernadene Person RDCS Referring Phys: [id number] [place]  1. Septal motion is abnormal likely due to conduction abnormality. . Left ventricular ejection fraction, by estimation, is 60 to 65%. The left ventricle has normal function. The left ventricle has no regional wall motion abnormalities. There is mild concentric left ventricular hypertrophy. Left ventricular diastolic parameters are consistent with Grade I diastolic dysfunction (impaired relaxation).  2. Right ventricular systolic function is normal. The right ventricular size is normal. There is mildly elevated pulmonary artery systolic pressure.  3. The mitral valve is normal in structure. Trivial mitral valve regurgitation.  4. The aortic valve is tricuspid. There is mild calcification of the aortic valve. There is mild thickening of the aortic valve. Aortic valve regurgitation is trivial. Mild aortic valve sclerosis is present, with no evidence of aortic valve stenosis.  5. The inferior vena cava is normal in size with greater than 50% respiratory variability, suggesting right atrial pressure of 3 mmHg. Comparison(s): No prior Echocardiogram. FINDINGS  Left Ventricle: Septal motion is abnormal likely due to conduction abnormality. Left ventricular ejection fraction, by estimation, is 60 to 65%. The left ventricle has normal function. The left ventricle has no regional wall motion abnormalities. The left ventricular internal cavity size was normal in size. There is mild concentric left ventricular hypertrophy. Left ventricular diastolic parameters are consistent with Grade I diastolic dysfunction (impaired relaxation). Right Ventricle: The right  ventricular size is normal. No increase in right ventricular wall thickness. Right ventricular systolic function is normal. There is mildly elevated pulmonary artery systolic pressure. The tricuspid regurgitant velocity is 3.10  m/s, and with an assumed right atrial pressure of 3 mmHg, the estimated right ventricular systolic pressure is 61.4 mmHg. Left Atrium: Left atrial size was normal in size. Right Atrium: Right atrial size was normal in size. Pericardium: There is no evidence of pericardial effusion. Mitral Valve: The mitral valve is normal in structure. There is mild thickening of the mitral valve leaflet(s). Mild mitral annular calcification. Trivial mitral valve regurgitation. Tricuspid Valve: The tricuspid valve is normal in structure. Tricuspid valve regurgitation is mild. Aortic Valve: The aortic valve is tricuspid. There is mild calcification of the aortic valve. There is mild thickening of the aortic valve. Aortic valve regurgitation is trivial. Mild aortic valve sclerosis is present, with no evidence of aortic valve stenosis. Pulmonic Valve: The pulmonic valve was normal in  structure. Pulmonic valve regurgitation is not visualized. Aorta: The aortic root and ascending aorta are structurally normal, with no evidence of dilitation. Venous: The inferior vena cava is normal in size with greater than 50% respiratory variability, suggesting right atrial pressure of 3 mmHg. IAS/Shunts: No atrial level shunt detected by color flow Doppler.  LEFT VENTRICLE PLAX 2D LVIDd:         3.00 cm  Diastology LVIDs:         2.10 cm  LV e' medial:    4.68 cm/s LV PW:         1.20 cm  LV E/e' medial:  15.3 LV IVS:        1.20 cm  LV e' lateral:   5.22 cm/s LVOT diam:     1.80 cm  LV E/e' lateral: 13.7 LV SV:         74 LV SV Index:   46 LVOT Area:     2.54 cm  RIGHT VENTRICLE RV S prime:     11.80 cm/s TAPSE (M-mode): 1.9 cm LEFT ATRIUM           Index       RIGHT ATRIUM           Index LA diam:      1.90 cm 1.16 cm/m   RA Area:     12.70 cm LA Vol (A2C): 28.4 ml 17.39 ml/m RA Volume:   31.10 ml  19.05 ml/m LA Vol (A4C): 25.2 ml 15.43 ml/m  AORTIC VALVE LVOT Vmax:   156.00 cm/s LVOT Vmean:  113.000 cm/s LVOT VTI:    0.292 m  AORTA Ao Root diam: 2.90 cm Ao Asc diam:  3.00 cm MITRAL VALVE                TRICUSPID VALVE MV Area (PHT): 4.83 cm     TR Peak grad:   38.4 mmHg MV Decel Time: 157 msec     TR Vmax:        310.00 cm/s MV E velocity: 71.65 cm/s MV A velocity: 148.00 cm/s  SHUNTS MV E/A ratio:  0.48         Systemic VTI:  0.29 m                             Systemic Diam: 1.80 cm Gwyndolyn Kaufman MD Electronically signed by Gwyndolyn Kaufman MD Signature Date/Time: 05/08/2020/2:25:58 PM    Final     Assessment/Plan 1. Intussusception ([place]) -s/p right hemicolectomy -f/us as above with Dr. Ninfa Linden for staple removal and reassessment -here for PT, OT due to deconditioning postop  2. Pulmonary embolism, bilateral ([place]) -is on eliquis, monitor hgb carefully  3. Gastrointestinal hemorrhage, unspecified gastrointestinal hemorrhage type -felt to be at anastomosis and resolved in 24 hrs -monitor  4. Abnormal CT scan of lung -with possible mass -was treated as pneumonia and f/u CT chest recommended outpatient to assess for resolution -has h/o breast cancer  5. Age-related osteoporosis with current pathological fracture, sequela -with pelvic fxs--has used cane or walker since and has had some minimal assistance with adls from her sister next door and lives with her adult son -appears she is not currently on D3 or calcium nor any osteoporosis medications  6. Hypothyroidism, unspecified type -cont levothyroxine therapy Lab Results  Component Value Date   TSH 4.604 (H) 05/01/2020  -may need f/u outpatient when not acutely ill  7. Hyperlipidemia, unspecified hyperlipidemia type -  cont crestor three times weekly  8. Full code status - Full code paperwork was done upon her arrival here and discussion had  been held with palliative care at the hospital where she said she would want attempted CPR but would not want to be kept alive long-term on machines  9. Essential hypertension -bp currently good w/o hctz, monitor and add back if needed  Family/ staff Communication: d/w snf nurse  Labs/tests ordered:   Cbc, bmp at one week  [place] Vanaken L. Cathyann Kilfoyle, D.O. [facility] [address], [place] [address] Cell Phone (Mon-Fri 8am-5pm):  [phone] On Call:  [phone] & follow prompts after 5pm & weekends Office Phone:  [phone] Office Fax:  [phone]

## 2020-05-19 DIAGNOSIS — Z03818 Encounter for observation for suspected exposure to other biological agents ruled out: Secondary | ICD-10-CM | POA: Diagnosis not present

## 2020-05-26 ENCOUNTER — Non-Acute Institutional Stay (SKILLED_NURSING_FACILITY): Payer: Medicare HMO | Admitting: Family

## 2020-05-26 ENCOUNTER — Encounter: Payer: Self-pay | Admitting: Family

## 2020-05-26 DIAGNOSIS — R2681 Unsteadiness on feet: Secondary | ICD-10-CM | POA: Diagnosis not present

## 2020-05-26 DIAGNOSIS — E785 Hyperlipidemia, unspecified: Secondary | ICD-10-CM

## 2020-05-26 DIAGNOSIS — K561 Intussusception: Secondary | ICD-10-CM | POA: Diagnosis not present

## 2020-05-26 DIAGNOSIS — R918 Other nonspecific abnormal finding of lung field: Secondary | ICD-10-CM

## 2020-05-26 DIAGNOSIS — E039 Hypothyroidism, unspecified: Secondary | ICD-10-CM | POA: Diagnosis not present

## 2020-05-26 DIAGNOSIS — J309 Allergic rhinitis, unspecified: Secondary | ICD-10-CM | POA: Diagnosis not present

## 2020-05-26 DIAGNOSIS — K5901 Slow transit constipation: Secondary | ICD-10-CM

## 2020-05-26 DIAGNOSIS — K922 Gastrointestinal hemorrhage, unspecified: Secondary | ICD-10-CM | POA: Diagnosis not present

## 2020-05-26 DIAGNOSIS — M8000XS Age-related osteoporosis with current pathological fracture, unspecified site, sequela: Secondary | ICD-10-CM | POA: Diagnosis not present

## 2020-05-26 DIAGNOSIS — I2699 Other pulmonary embolism without acute cor pulmonale: Secondary | ICD-10-CM

## 2020-05-26 DIAGNOSIS — I10 Essential (primary) hypertension: Secondary | ICD-10-CM | POA: Diagnosis not present

## 2020-05-26 MED ORDER — GABAPENTIN 300 MG PO CAPS
300.0000 mg | ORAL_CAPSULE | Freq: Two times a day (BID) | ORAL | 0 refills | Status: DC
Start: 2020-05-26 — End: 2020-05-29

## 2020-05-26 MED ORDER — BISACODYL 10 MG RE SUPP: 10.0000 mg | RECTAL | 0 refills | Status: DC | PRN

## 2020-05-26 MED ORDER — POLYETHYLENE GLYCOL 3350 17 G PO PACK: 17.0000 g | PACK | Freq: Every day | ORAL | 0 refills | Status: DC | PRN

## 2020-05-26 MED ORDER — APIXABAN 5 MG PO TABS: 5.0000 mg | ORAL_TABLET | Freq: Two times a day (BID) | ORAL | 0 refills | Status: DC

## 2020-05-26 MED ORDER — POTASSIUM CHLORIDE CRYS ER 10 MEQ PO TBCR
10.0000 meq | EXTENDED_RELEASE_TABLET | Freq: Every day | ORAL | 0 refills | Status: DC
Start: 2020-05-26 — End: 2020-06-16

## 2020-05-26 MED ORDER — FLUTICASONE PROPIONATE 50 MCG/ACT NA SUSP: 2.0000 | Freq: Every day | NASAL | 0 refills | Status: AC | PRN

## 2020-05-26 MED ORDER — ROSUVASTATIN CALCIUM 5 MG PO TABS: 5.0000 mg | ORAL_TABLET | ORAL | 0 refills | Status: DC

## 2020-05-26 MED ORDER — LEVOTHYROXINE SODIUM 75 MCG PO TABS: 75.0000 ug | ORAL_TABLET | Freq: Every day | ORAL | 0 refills | Status: DC

## 2020-05-26 NOTE — Progress Notes (Signed)
Location:  [facility] Room Number: 101/P Place of Service:  SNF (31)  Provider: Marlowe Sax FNP-C   PCP: Ariel Branch, MD Patient Care Team: Ariel Branch, MD as PCP - General Ariel Breeding, MD as PCP - Cardiology (Cardiology) Ariel Sartorius, MD as Consulting Physician (Otolaryngology) Ariel [facility], DPM as Consulting Physician (Podiatry) Ariel [facility], Ariel [facility], Ariel [facility] as Pharmacist (Pharmacist)  Extended Emergency Contact Information Primary Emergency Contact: Ariel [facility] Phone: [phone] Mobile Phone: [phone] Relation: Granddaughter  Code Status: Full code  Goals of care:  Advanced Directive information Advanced Directives 05/26/2020  Does Patient Have a Medical Advance Directive? Yes  Type of Advance Directive -  Does patient want to make changes to medical advance directive? No - Patient declined  Copy of Ariel [facility] in Chart? -     Allergies  Allergen Reactions  . Ibuprofen     REACTION: bleed    Chief Complaint  Patient presents with  . Discharge Note    Discharge Visit    HPI:  86 y.o. [facility].She was here for short term rehabilitation for post [facility] admission at [facility] from 05/08/2020 - 05/12/2020 for intussusception that required an open right hemicolectomy on 05/01/2020 by Ariel [facility].Pathology results showed a submucosal lipoma at the lead point of the intussusception. She also Had bilateral Pulmonary Embolism and was started on Eliquis. At least for a 3  Months course.Chest CT scan showed 16 X 11 mm irregular solid density in her left upper lobe.she was treated with a 5 Ariel [facility] course of antibiotics for pneumonia.A repeat CT scan was recommended in 3-4 weeks to ensure resolution of the opacity in the lung. she was discharged here for Rehab but was send back to the [facility] on her first night after she  developed rectal bleeding.she was admitted from 05/08/2020 - 05/12/2020.     She has a medical history of Hypertension,Hypothyroidism,Hyperlipidemia ,Bilateral PE,Osteoporosis,allergic Rhinitis,GI bleed,breast cancer,diverticulitis,CKD among other condition.she has had unremarkable stay here in rehab.   She has worked well with PT/OT now stable for discharge home.States granddaughter lives about 15 minutes away and her sister lives next door and has church friends that visit.She will be discharged home with Home health PT/OT to continue with ROM, Exercise, Gait stability and muscle strengthening. No DME required. Home health services will be arranged by facility social worker prior to discharge. Prescription medication will be written x 1 month then patient to follow up with PCP in 1-2 weeks.Also has a follow up appointment with [facility] on 05/27/2020 at 2: 10 pm with Ariel [facility]. She denies any acute issues this visit.she states praising the [employer] for her healing. Facility staff report no new concerns.    Past Medical History:  Diagnosis Date  . Anemia   . Breast CA ([place])    left breast-invasive ductal ca stage 1-stopped arimidex 2008  . Diverticulitis of Ariel   . GI bleed 04/2020  . Hiatal hernia   . Hyperlipidemia   . Hypertension   . Hypothyroidism   . Osteoarthritis   . Osteopenia    DEXA 7/02  . Syncope 2005   CT head (-), ECHO essent. neg, stress test (-), carotid u/s (-)    Past Surgical History:  Procedure Laterality Date  . BREAST BIOPSY Bilateral   . BREAST LUMPECTOMY  04/2002   left  . PARTIAL COLECTOMY N/A 05/01/2020   Procedure: PARTIAL  COLECTOMY;  Surgeon: Ariel Keens, MD;  Location: [place];  Service: General;  Laterality: N/A;  . TONSILLECTOMY AND ADENOIDECTOMY        reports that she has never smoked. She has never used smokeless tobacco. She reports that she does not drink alcohol and does not use drugs. Social History   Socioeconomic  History  . Marital status: Widowed    Spouse name: Not on file  . Number of children: 2  . Years of education: Not on file  . Highest education level: Not on file  Occupational History  . Occupation: n/a    Employer: RETIRED  Tobacco Use  . Smoking status: Never Smoker  . Smokeless tobacco: Never Used  Vaping Use  . Vaping Use: Never used  Substance and Sexual Activity  . Alcohol use: No  . Drug use: No  . Sexual activity: Not Currently  Other Topics Concern  . Not on file  Social History Narrative   Lost a son 4   G-son  live w/ her from time to time, not permanently   (Ariel [facility] is her son and used to be my pt,  h/o etoh)   Sister live next door , does help the pt    G-son @ [employer] , G-daughter @ [employer] since 1993   still drives, short distances, typically not at night.   Social Determinants of Health   Financial Resource Strain: Low Risk   . Difficulty of Paying Living Expenses: Not hard at all  Food Insecurity: No Food Insecurity  . Worried About Charity fundraiser in the Last Year: Never true  . Ran Out of Food in the Last Year: Never true  Transportation Needs: No Transportation Needs  . Lack of Transportation (Medical): No  . Lack of Transportation (Non-Medical): No  Physical Activity:   . Days of Exercise per Week: Not on file  . Minutes of Exercise per Session: Not on file  Stress: No Stress Concern Present  . Feeling of Stress : Only a little  Social Connections:   . Frequency of Communication with Friends and Family: Not on file  . Frequency of Social Gatherings with Friends and Family: Not on file  . Attends Religious Services: Not on file  . Active Member of Clubs or Organizations: Not on file  . Attends Archivist Meetings: Not on file  . Marital Status: Not on file  Intimate Partner Violence:   . Fear of Current or Ex-Partner: Not on file  . Emotionally Abused: Not on file  . Physically Abused: Not on file  .  Sexually Abused: Not on file   Functional Status Survey:    Allergies  Allergen Reactions  . Ibuprofen     REACTION: bleed    Pertinent  Health Maintenance Due  Topic Date Due  . INFLUENZA VACCINE  03/09/2020  . MAMMOGRAM  06/25/2021  . DEXA SCAN  Completed  . PNA vac Low Risk Adult  Completed    Medications: Outpatient Encounter Medications as of 05/26/2020  Medication Sig  . acetaminophen (TYLENOL) 325 MG tablet Take 2 tablets (650 mg total) by mouth every 6 (six) hours as needed for mild pain.  Marland Kitchen apixaban (ELIQUIS) 5 MG TABS tablet Take 1 tablet (5 mg total) by mouth 2 (two) times daily.  . bisacodyl (DULCOLAX) 10 MG suppository Place 10 mg rectally as needed for moderate constipation.  . feeding supplement, ENSURE ENLIVE, (ENSURE ENLIVE) LIQD Take 237 mLs by  mouth 2 (two) times daily between meals.  . fluticasone (FLONASE) 50 MCG/ACT nasal spray Place 2 sprays into both nostrils daily as needed for allergies or rhinitis.  Marland Kitchen gabapentin (NEURONTIN) 300 MG capsule Take 1 capsule (300 mg total) by mouth 2 (two) times daily.  Marland Kitchen levothyroxine (SYNTHROID) 75 MCG tablet Take 1 tablet (75 mcg total) by mouth daily before breakfast.  . Multiple Vitamins-Minerals (ICAPS AREDS 2 PO) Take 1 capsule by mouth 2 (two) times daily.  . polyethylene glycol (MIRALAX / GLYCOLAX) packet Take 17 g by mouth daily as needed for moderate constipation.  . potassium chloride (K-DUR,KLOR-CON) 10 MEQ tablet Take 1 tablet (10 mEq total) by mouth daily.  . psyllium (HYDROCIL/METAMUCIL) 95 % PACK Take 1 packet by mouth daily. Hold for diarrhoea  . rosuvastatin (CRESTOR) 5 MG tablet Take 1 tablet (5 mg total) by mouth 3 (three) times a week. No specific days  . [DISCONTINUED] oxyCODONE (OXY IR/ROXICODONE) 5 MG immediate release tablet Take 1 tablet (5 mg total) by mouth every 6 (six) hours as needed for moderate pain or severe pain. (Patient not taking: Reported on 05/26/2020)   No facility-administered  encounter medications on file as of 05/26/2020.     Review of Systems  Constitutional: Negative for appetite change, chills, fatigue and fever.  HENT: Negative for congestion, rhinorrhea, sinus pressure, sinus pain, sneezing, sore throat and trouble swallowing.   Eyes: Negative for discharge, redness and itching.  Respiratory: Negative for cough, chest tightness, shortness of breath and wheezing.   Cardiovascular: Negative for chest pain, palpitations and leg swelling.  Gastrointestinal: Negative for abdominal distention, abdominal pain, constipation, diarrhea, nausea and vomiting.  Endocrine: Negative for cold intolerance, heat intolerance, polydipsia, polyphagia and polyuria.  Genitourinary: Negative for difficulty urinating, dysuria, flank pain, frequency and urgency.  Musculoskeletal: Positive for gait problem. Negative for arthralgias, back pain, joint swelling and myalgias.  Skin: Negative for color change, pallor and rash.  Neurological: Negative for dizziness, speech difficulty, weakness, light-headedness, numbness and headaches.  Hematological: Does not bruise/bleed easily.  Psychiatric/Behavioral: Negative for agitation, behavioral problems, confusion and sleep disturbance. The patient is not nervous/anxious.     Vitals:   05/26/20 0917  BP: 119/72  Pulse: 82  Resp: 20  Temp: 97.8 F (36.6 C)  Weight: 138 lb 11.2 oz (62.9 kg)  Height: 5\' 2"  (1.575 m)   Body mass index is 25.37 kg/m. Physical Exam Vitals and nursing note reviewed.  Constitutional:      General: She is not in acute distress.    Appearance: She is overweight. She is not ill-appearing.  HENT:     Head: Normocephalic.     Nose: Nose normal. No congestion or rhinorrhea.     Mouth/Throat:     Mouth: Mucous membranes are moist.     Pharynx: Oropharynx is clear. No oropharyngeal exudate or posterior oropharyngeal erythema.  Eyes:     General: No scleral icterus.       Right eye: No discharge.         Left eye: No discharge.     Conjunctiva/sclera: Conjunctivae normal.     Pupils: Pupils are equal, round, and reactive to light.  Neck:     Vascular: No carotid bruit.  Cardiovascular:     Rate and Rhythm: Normal rate and regular rhythm.     Pulses: Normal pulses.     Heart sounds: Murmur heard.  No friction rub. No gallop.   Pulmonary:     Effort: Pulmonary effort is  normal. No respiratory distress.     Breath sounds: Normal breath sounds. No wheezing, rhonchi or rales.  Chest:     Chest wall: No tenderness.  Abdominal:     General: Bowel sounds are normal. There is no distension.     Palpations: Abdomen is soft. There is no mass.     Tenderness: There is no abdominal tenderness. There is no right CVA tenderness, left CVA tenderness, guarding or rebound.     Comments: Abdominal incision dry,clean and intact without any signs of infection   Musculoskeletal:        General: No swelling or tenderness. Normal range of motion.     Cervical back: Normal range of motion. No rigidity or tenderness.     Right lower leg: No edema.     Left lower leg: No edema.  Lymphadenopathy:     Cervical: No cervical adenopathy.  Skin:    General: Skin is warm and dry.     Coloration: Skin is not pale.     Findings: No bruising, erythema or rash.  Neurological:     Mental Status: She is alert and oriented to person, place, and time.     Cranial Nerves: No cranial nerve deficit.     Sensory: No sensory deficit.     Coordination: Coordination normal.     Gait: Gait abnormal.  Psychiatric:        Mood and Affect: Mood normal.        Behavior: Behavior normal.        Thought Content: Thought content normal.        Judgment: Judgment normal.     Labs reviewed: Basic Metabolic Panel: Recent Labs    05/02/20 0250 05/02/20 0250 05/03/20 0141 05/03/20 0141 05/04/20 0301 05/04/20 0301 05/05/20 0327 05/08/20 0408 05/08/20 0812 05/12/20 0330  NA 136   < > 134*   < > 137   < > 139 139  --  141   K 3.5   < > 4.1   < > 3.8   < > 4.1 4.1  --  4.1  CL 99   < > 99   < > 102   < > 103 98  --  102  CO2 29   < > 28   < > 28   < > 30 34*  --  33*  GLUCOSE 118*   < > 122*   < > 118*   < > 101* 106*  --  103*  BUN <5*   < > <5*   < > 7*   < > <5* 7*  --  11  CREATININE 0.59   < > 0.52   < > 0.54   < > 0.50 0.50  --  0.61  CALCIUM 8.0*   < > 8.1*   < > 8.4*   < > 8.5* 8.7*  --  8.6*  MG 1.6*   < > 2.0   < > 1.9  --  1.7  --  1.7  --   PHOS 2.4*  --  4.1  --   --   --  2.7  --   --   --    < > = values in this interval not displayed.   Liver Function Tests: Recent Labs    05/03/20 0141 05/05/20 0327 05/08/20 0408  AST 15 14* 18  ALT 7 8 11   ALKPHOS 33* 27* 31*  BILITOT 0.5 0.5 0.7  PROT 4.2* 3.9* 4.4*  ALBUMIN 2.0* 1.8* 2.1*   Recent Labs    04/25/20 1620 04/29/20 1204  LIPASE 8 24   CBC: Recent Labs    06/26/19 0954 06/26/19 0954 04/25/20 1620 04/25/20 1620 04/29/20 1204 04/30/20 0046 05/10/20 0128 05/11/20 0152 05/12/20 0330  WBC 5.6   < > 7.5   < > 9.4   < > 7.4 7.3 6.8  NEUTROABS 3.8  --  5,190  --  7.3  --   --   --   --   HGB 15.2*   < > 15.6*   < > 15.7*   < > 8.6* 8.4* 8.5*  HCT 44.9   < > 45.9*   < > 47.2*   < > 26.4* 26.5* 27.0*  MCV 95.3   < > 91.6   < > 93.1   < > 95.3 96.0 97.1  PLT 234.0   < > 257   < > 289   < > 291 260 254   < > = values in this interval not displayed.   CBG: Recent Labs    05/04/20 1211  GLUCAP 155*    Procedures and Imaging Studies During Stay: CT ABDOMEN PELVIS WO CONTRAST  Result Date: 05/08/2020 CLINICAL DATA:  Recent partial colectomy.  Gastrointestinal bleeding EXAM: CT ABDOMEN AND PELVIS WITHOUT CONTRAST TECHNIQUE: Multidetector CT imaging of the abdomen and pelvis was performed following the standard protocol without oral or IV contrast. COMPARISON:  April 29, 2020 FINDINGS: Lower chest: There are pleural effusions bilaterally. There is compressive atelectasis in the lung bases with suspected superimposed degree  of pneumonia in the posterior left base. There is a sizable hiatal type hernia present. There is a suspected lipoma within the hiatal hernia measuring 7 x 7 mm. Hepatobiliary: No focal liver lesions are evident on this noncontrast enhanced study. The gallbladder wall is not appreciably thickened. There is no biliary duct dilatation. Pancreas: There is a partially calcified apparent mass arising along the rightward aspect of the head of the pancreas eccentrically measuring 1.7 x 1.7 x 1.7 cm. No other pancreatic lesion evident. Spleen: No splenic lesions are appreciable. Adrenals/Urinary Tract: Adrenals bilaterally appear normal. There is a cyst arising from the posterior mid right kidney measuring 1.7 x 1.7 cm. There is a cyst lobulated cyst arising from the lower pole the left kidney laterally measuring 2.6 x 2.5 cm. There is no appreciable hydronephrosis on either side. There is no appreciable renal or ureteral calculus on either side. Urinary bladder is midline with wall thickness within normal limits. Stomach/Bowel: Postoperative changes noted in the transverse Ariel region proximally with patent anastomoses. No fluid collection or extraluminal air seen in this area of recent surgery. Slight soft tissue stranding in this area is likely of postoperative etiology. There are multiple sigmoid diverticula without demonstrable diverticulitis. There is no demonstrable bowel obstruction. The terminal ileum region appears normal. There is no demonstrable free air or portal venous air. Vascular/Lymphatic: No abdominal aortic aneurysm. There is aortic and iliac artery atherosclerosis. There is a left retroaortic renal vein, an anatomic variant. No adenopathy evident in the abdomen or pelvis. Reproductive: Uterus is anteverted.  There is no adnexal mass. Other: There is no appreciable periappendiceal region inflammation. No abscess or ascites is evident in the abdomen or pelvis. Musculoskeletal: Bones are osteoporotic.  There is degenerative change in each hip joint. There is also degenerative change in the lumbar spine. No blastic or lytic bone lesions. There is no intramuscular or abdominal wall lesion evident. IMPRESSION:  1. Status post removal of previous lesion in the Ariel near the hepatic flexure involving the proximal transverse Ariel. Anastomosis patent in this area. Slight soft tissue stranding in this area is felt to be due to recent surgery. There is no evidence suggesting perforation or abscess. No fluid is seen surrounding this area of recent surgery. 2.  Sigmoid diverticula without diverticulitis. 3. Pleural effusions bilaterally. Compressive atelectasis in the lung bases with suspected focus of pneumonia in the posterior left base. 4. Sizable hiatal hernia. Small lipoma noted within the rightward aspect of this hiatal hernia. 5.  No bowel obstruction.  No abscess in the abdomen or pelvis. 6. 1.7 x 1.7 x 1.7 cm partially calcified mass arising eccentrically from the pancreatic head. Significance of this finding in this age group is uncertain. Particular attention this area on subsequent evaluations is warranted. Pancreas otherwise appears normal. 7.  Aortic Atherosclerosis (ICD10-I70.0). Comment: A site for gastrointestinal bleeding has not been established with this study. If bleeding continues, nuclear medicine gastrointestinal bleeding study may be helpful for further assessment and potential localization of bleeding site. Electronically Signed   By: Lowella Grip III M.D.   On: 05/08/2020 08:19   CT Angio Chest PE W and/or Wo Contrast  Result Date: 04/29/2020 CLINICAL DATA:  Lower abdominal pain, bloody stools. EXAM: CT ANGIOGRAPHY CHEST CT ABDOMEN AND PELVIS WITH CONTRAST TECHNIQUE: Multidetector CT imaging of the chest was performed using the standard protocol during bolus administration of intravenous contrast. Multiplanar CT image reconstructions and MIPs were obtained to evaluate the vascular  anatomy. Multidetector CT imaging of the abdomen and pelvis was performed using the standard protocol during bolus administration of intravenous contrast. CONTRAST:  122mL OMNIPAQUE IOHEXOL 350 MG/ML SOLN COMPARISON:  None. FINDINGS: CTA CHEST FINDINGS Cardiovascular: Filling defects are noted in peripheral branches of both pulmonary arteries consistent with acute pulmonary emboli. Atherosclerosis of thoracic aorta is noted without aneurysm or dissection. Mild cardiomegaly is noted. No pericardial effusion is noted. Mediastinum/Nodes: Large sliding-type hiatal hernia is noted. Thyroid gland is unremarkable. No adenopathy is noted. Lungs/Pleura: No pneumothorax or pleural effusion is noted. Irregular density is noted peripherally in the left upper lobe with 16 x 11 mm irregular solid density; this may simply represent focal inflammation, but underlying neoplasm cannot be excluded. Follow-up CT scan in 3 weeks is recommended to ensure resolution or stability. Musculoskeletal: No chest wall abnormality. No acute or significant osseous findings. Review of the MIP images confirms the above findings. CT ABDOMEN and PELVIS FINDINGS Hepatobiliary: No focal liver abnormality is seen. No gallstones, gallbladder wall thickening, or biliary dilatation. Pancreas: Unremarkable. No pancreatic ductal dilatation or surrounding inflammatory changes. Spleen: Normal in size without focal abnormality. Adrenals/Urinary Tract: Adrenal glands are unremarkable. Bilateral renal cysts are noted. No hydronephrosis or renal obstruction is noted. No renal or ureteral calculi are noted. Urinary bladder is unremarkable. Stomach/Bowel: There is a large intussusception involving the terminal ileum, right and transverse Ariel. Possible 19 mm lipoma may be at the lead point. Sigmoid diverticulosis is noted without inflammation. Vascular/Lymphatic: Aortic atherosclerosis. No enlarged abdominal or pelvic lymph nodes. Reproductive: Uterus and bilateral  adnexa are unremarkable. Other: No abdominal wall hernia or abnormality. No abdominopelvic ascites. Musculoskeletal: No acute or significant osseous findings. Review of the MIP images confirms the above findings. IMPRESSION: 1. Acute bilateral pulmonary emboli are noted. 2. Irregular density is noted peripherally in the left upper lobe with 16 x 11 mm irregular solid density; this may simply represent focal inflammation, but underlying  neoplasm cannot be excluded. Follow-up CT scan in 3 weeks is recommended to ensure resolution or stability. 3. Large intussusception is seen involving the terminal ileum, right and transverse Ariel. Possible 19 mm lipoma may be at the lead point. 4. Large sliding-type hiatal hernia is noted. 5. Sigmoid diverticulosis without inflammation. Critical Value/emergent results were called by telephone at the time of interpretation on 04/29/2020 at 3:17 pm to provider [facility] , who verbally acknowledged these results. Aortic Atherosclerosis (ICD10-I70.0). Electronically Signed   By: Marijo Conception M.D.   On: 04/29/2020 15:17   CT ABDOMEN PELVIS W CONTRAST  Result Date: 04/29/2020 CLINICAL DATA:  Lower abdominal pain, bloody stools. EXAM: CT ANGIOGRAPHY CHEST CT ABDOMEN AND PELVIS WITH CONTRAST TECHNIQUE: Multidetector CT imaging of the chest was performed using the standard protocol during bolus administration of intravenous contrast. Multiplanar CT image reconstructions and MIPs were obtained to evaluate the vascular anatomy. Multidetector CT imaging of the abdomen and pelvis was performed using the standard protocol during bolus administration of intravenous contrast. CONTRAST:  146mL OMNIPAQUE IOHEXOL 350 MG/ML SOLN COMPARISON:  None. FINDINGS: CTA CHEST FINDINGS Cardiovascular: Filling defects are noted in peripheral branches of both pulmonary arteries consistent with acute pulmonary emboli. Atherosclerosis of thoracic aorta is noted without aneurysm or dissection. Mild  cardiomegaly is noted. No pericardial effusion is noted. Mediastinum/Nodes: Large sliding-type hiatal hernia is noted. Thyroid gland is unremarkable. No adenopathy is noted. Lungs/Pleura: No pneumothorax or pleural effusion is noted. Irregular density is noted peripherally in the left upper lobe with 16 x 11 mm irregular solid density; this may simply represent focal inflammation, but underlying neoplasm cannot be excluded. Follow-up CT scan in 3 weeks is recommended to ensure resolution or stability. Musculoskeletal: No chest wall abnormality. No acute or significant osseous findings. Review of the MIP images confirms the above findings. CT ABDOMEN and PELVIS FINDINGS Hepatobiliary: No focal liver abnormality is seen. No gallstones, gallbladder wall thickening, or biliary dilatation. Pancreas: Unremarkable. No pancreatic ductal dilatation or surrounding inflammatory changes. Spleen: Normal in size without focal abnormality. Adrenals/Urinary Tract: Adrenal glands are unremarkable. Bilateral renal cysts are noted. No hydronephrosis or renal obstruction is noted. No renal or ureteral calculi are noted. Urinary bladder is unremarkable. Stomach/Bowel: There is a large intussusception involving the terminal ileum, right and transverse Ariel. Possible 19 mm lipoma may be at the lead point. Sigmoid diverticulosis is noted without inflammation. Vascular/Lymphatic: Aortic atherosclerosis. No enlarged abdominal or pelvic lymph nodes. Reproductive: Uterus and bilateral adnexa are unremarkable. Other: No abdominal wall hernia or abnormality. No abdominopelvic ascites. Musculoskeletal: No acute or significant osseous findings. Review of the MIP images confirms the above findings. IMPRESSION: 1. Acute bilateral pulmonary emboli are noted. 2. Irregular density is noted peripherally in the left upper lobe with 16 x 11 mm irregular solid density; this may simply represent focal inflammation, but underlying neoplasm cannot be  excluded. Follow-up CT scan in 3 weeks is recommended to ensure resolution or stability. 3. Large intussusception is seen involving the terminal ileum, right and transverse Ariel. Possible 19 mm lipoma may be at the lead point. 4. Large sliding-type hiatal hernia is noted. 5. Sigmoid diverticulosis without inflammation. Critical Value/emergent results were called by telephone at the time of interpretation on 04/29/2020 at 3:17 pm to provider [facility] , who verbally acknowledged these results. Aortic Atherosclerosis (ICD10-I70.0). Electronically Signed   By: Marijo Conception M.D.   On: 04/29/2020 15:17   DG Chest Portable 1 View  Result Date: 04/29/2020 CLINICAL  DATA:  Shortness of breath. EXAM: PORTABLE CHEST 1 VIEW COMPARISON:  05/24/2017 FINDINGS: Large hiatal hernia, better characterized on priors. Similar lung hyperinflation. Small left pleural effusion versus scar. Bibasilar (left greater than right) opacities. Mild enlargement of the cardiac silhouette, similar to prior. Aortic atherosclerosis. No acute bony abnormality. IMPRESSION: 1. Small left pleural effusion versus scar. Bibasilar opacities may represent atelectasis, although aspiration and/or pneumonia is not excluded. 2. Hyperinflation/COPD. 3. Mild cardiomegaly. Electronically Signed   By: Margaretha Sheffield MD   On: 04/29/2020 12:41   ECHOCARDIOGRAM COMPLETE  Result Date: 05/08/2020    ECHOCARDIOGRAM REPORT   Patient Name:   Ariel Medina Date of Exam: 05/08/2020 Medical Rec #:  [id number]       Height:       62.0 in Accession #:    [id number]      Weight:       138.0 lb Date of Birth:  May 27, 1928        BSA:          1.633 m Patient Age:    89 years        BP:           128/74 mmHg Patient Gender: F               HR:           92 bpm. Exam Location:  Inpatient Procedure: 2D Echo, Cardiac Doppler and Color Doppler Indications:    R94.31 Abnormal EKG  History:        Patient has no prior history of Echocardiogram examinations.                  COPD, Signs/Symptoms:Syncope; Risk Factors:Hypertension,                 Dyslipidemia and Diabetes.  Sonographer:    Bernadene Person RDCS Referring Phys: [id number] [place]  1. Septal motion is abnormal likely due to conduction abnormality. . Left ventricular ejection fraction, by estimation, is 60 to 65%. The left ventricle has normal function. The left ventricle has no regional wall motion abnormalities. There is mild concentric left ventricular hypertrophy. Left ventricular diastolic parameters are consistent with Grade I diastolic dysfunction (impaired relaxation).  2. Right ventricular systolic function is normal. The right ventricular size is normal. There is mildly elevated pulmonary artery systolic pressure.  3. The mitral valve is normal in structure. Trivial mitral valve regurgitation.  4. The aortic valve is tricuspid. There is mild calcification of the aortic valve. There is mild thickening of the aortic valve. Aortic valve regurgitation is trivial. Mild aortic valve sclerosis is present, with no evidence of aortic valve stenosis.  5. The inferior vena cava is normal in size with greater than 50% respiratory variability, suggesting right atrial pressure of 3 mmHg. Comparison(s): No prior Echocardiogram. FINDINGS  Left Ventricle: Septal motion is abnormal likely due to conduction abnormality. Left ventricular ejection fraction, by estimation, is 60 to 65%. The left ventricle has normal function. The left ventricle has no regional wall motion abnormalities. The left ventricular internal cavity size was normal in size. There is mild concentric left ventricular hypertrophy. Left ventricular diastolic parameters are consistent with Grade I diastolic dysfunction (impaired relaxation). Right Ventricle: The right ventricular size is normal. No increase in right ventricular wall thickness. Right ventricular systolic function is normal. There is mildly elevated pulmonary artery systolic pressure.  The tricuspid regurgitant velocity is 3.10  m/s, and with an assumed right atrial pressure  of 3 mmHg, the estimated right ventricular systolic pressure is 06.2 mmHg. Left Atrium: Left atrial size was normal in size. Right Atrium: Right atrial size was normal in size. Pericardium: There is no evidence of pericardial effusion. Mitral Valve: The mitral valve is normal in structure. There is mild thickening of the mitral valve leaflet(s). Mild mitral annular calcification. Trivial mitral valve regurgitation. Tricuspid Valve: The tricuspid valve is normal in structure. Tricuspid valve regurgitation is mild. Aortic Valve: The aortic valve is tricuspid. There is mild calcification of the aortic valve. There is mild thickening of the aortic valve. Aortic valve regurgitation is trivial. Mild aortic valve sclerosis is present, with no evidence of aortic valve stenosis. Pulmonic Valve: The pulmonic valve was normal in structure. Pulmonic valve regurgitation is not visualized. Aorta: The aortic root and ascending aorta are structurally normal, with no evidence of dilitation. Venous: The inferior vena cava is normal in size with greater than 50% respiratory variability, suggesting right atrial pressure of 3 mmHg. IAS/Shunts: No atrial level shunt detected by color flow Doppler.  LEFT VENTRICLE PLAX 2D LVIDd:         3.00 cm  Diastology LVIDs:         2.10 cm  LV e' medial:    4.68 cm/s LV PW:         1.20 cm  LV E/e' medial:  15.3 LV IVS:        1.20 cm  LV e' lateral:   5.22 cm/s LVOT diam:     1.80 cm  LV E/e' lateral: 13.7 LV SV:         74 LV SV Index:   46 LVOT Area:     2.54 cm  RIGHT VENTRICLE RV S prime:     11.80 cm/s TAPSE (M-mode): 1.9 cm LEFT ATRIUM           Index       RIGHT ATRIUM           Index LA diam:      1.90 cm 1.16 cm/m  RA Area:     12.70 cm LA Vol (A2C): 28.4 ml 17.39 ml/m RA Volume:   31.10 ml  19.05 ml/m LA Vol (A4C): 25.2 ml 15.43 ml/m  AORTIC VALVE LVOT Vmax:   156.00 cm/s LVOT Vmean:  113.000  cm/s LVOT VTI:    0.292 m  AORTA Ao Root diam: 2.90 cm Ao Asc diam:  3.00 cm MITRAL VALVE                TRICUSPID VALVE MV Area (PHT): 4.83 cm     TR Peak grad:   38.4 mmHg MV Decel Time: 157 msec     TR Vmax:        310.00 cm/s MV E velocity: 71.65 cm/s MV A velocity: 148.00 cm/s  SHUNTS MV E/A ratio:  0.48         Systemic VTI:  0.29 m                             Systemic Diam: 1.80 cm Gwyndolyn Kaufman MD Electronically signed by Gwyndolyn Kaufman MD Signature Date/Time: 05/08/2020/2:25:58 PM    Final    VAS [place] LOWER EXTREMITY VENOUS (DVT)  Result Date: 04/30/2020  Lower Venous DVTStudy Indications: Edema.  Risk Factors: Confirmed PE. Comparison Study: No prior studies. Performing Technologist: Darlin Coco  Examination Guidelines: A complete evaluation includes B-mode imaging, spectral Doppler, color Doppler,  and power Doppler as needed of all accessible portions of each vessel. Bilateral testing is considered an integral part of a complete examination. Limited examinations for reoccurring indications may be performed as noted. The reflux portion of the exam is performed with the patient in reverse Trendelenburg.  +---------+---------------+---------+-----------+----------+--------------+ RIGHT    CompressibilityPhasicitySpontaneityPropertiesThrombus Aging +---------+---------------+---------+-----------+----------+--------------+ CFV      Full           Yes      Yes                                 +---------+---------------+---------+-----------+----------+--------------+ SFJ      Full                                                        +---------+---------------+---------+-----------+----------+--------------+ FV Prox  Full                                                        +---------+---------------+---------+-----------+----------+--------------+ FV Mid   Full                                                         +---------+---------------+---------+-----------+----------+--------------+ FV DistalFull                                                        +---------+---------------+---------+-----------+----------+--------------+ PFV      Full                                                        +---------+---------------+---------+-----------+----------+--------------+ POP      None           No       No         Mobile    Acute          +---------+---------------+---------+-----------+----------+--------------+ PTV      Partial        No       No                   Acute          +---------+---------------+---------+-----------+----------+--------------+ PERO     None           No       No                   Acute          +---------+---------------+---------+-----------+----------+--------------+   +---------+---------------+---------+-----------+----------+--------------+ LEFT     CompressibilityPhasicitySpontaneityPropertiesThrombus Aging +---------+---------------+---------+-----------+----------+--------------+ CFV      Full           Yes  Yes                                 +---------+---------------+---------+-----------+----------+--------------+ SFJ      Full                                                        +---------+---------------+---------+-----------+----------+--------------+ FV Prox  Full                                                        +---------+---------------+---------+-----------+----------+--------------+ FV Mid   Full                                                        +---------+---------------+---------+-----------+----------+--------------+ FV DistalFull                                                        +---------+---------------+---------+-----------+----------+--------------+ PFV      Full                                                         +---------+---------------+---------+-----------+----------+--------------+ POP      Full           Yes      Yes                                 +---------+---------------+---------+-----------+----------+--------------+ PTV      Full                                                        +---------+---------------+---------+-----------+----------+--------------+ PERO     Partial        No       No                   Acute          +---------+---------------+---------+-----------+----------+--------------+     Summary: RIGHT: - Findings consistent with acute deep vein thrombosis involving the right popliteal vein, right posterior tibial veins, and right peroneal veins. - A cystic structure is found in the popliteal fossa.  LEFT: - Findings consistent with acute deep vein thrombosis involving the left peroneal veins. - No cystic structure found in the popliteal fossa.  *See table(s) above for measurements and observations. Electronically signed by Harold Barban MD on 04/30/2020 at 9:32:39 PM.    Final     Assessment/Plan:  1. Intussusception ([place]) Status open right hemicolectomy on 05/01/2020 by Ariel Medina. Incision healed without any signs of infection. -  Pain well controlled.  - Aware to follow appointment with [facility] on 05/27/2020 at 2: 10 pm with Ariel Medina - CBC, BMP in 1-2 weeks PCP -  2. Pulmonary embolism, bilateral ([place]) Started on EliQuis during Medina admission for at least 3 months   3. Hypothyroidism, unspecified type No latest TSH level for review will defer to PCP  - continue on Levothyroxine 75 mcg tablet before breakfast   4. Essential hypertension B/p well controlled. HCZT D/ced during hospitalization. - continue to monitor B/p   5. Hyperlipidemia, unspecified hyperlipidemia type Continue on rosuvastatin 5 mg tablet three times per week.   6. Gastrointestinal hemorrhage, unspecified gastrointestinal hemorrhage  type Status post GI bleed. - hgb has improved.  CBC in 1-2 weeks with PCP   7. Age-related osteoporosis with current pathological fracture, sequela Not on Vit D or calcium supplement   8. Unsteady gait Has worked well with PT/ OT. Will discharge with home PT/OT to continue with ROM, Exercise, Gait stability and muscle strengthening. No DME required.Fall and safety precautions.   9. Allergic rhinitis, unspecified seasonality, unspecified trigger Symptoms under controlled.  10. Abnormal CT scan of the Lung  Treated with 5 days course of antibiotics during hospitalization. A repeat CT scan was recommended in 3-4 weeks to ensure resolution of the opacity in the lung.  Patient is being discharged with the following home health services:   -PT/OT for ROM, exercise, gait stability and muscle strengthening  Patient is being discharged with the following durable medical equipment:   - No new DME required.  Patient has been advised to f/u with their PCP in 1-2 weeks to for a transitions of care visit.Social services at their facility was responsible for arranging this appointment.  Pt was provided with adequate prescriptions of noncontrolled medications to reach the scheduled appointment.For controlled substances, a limited supply was provided as appropriate for the individual patient. If the pt normally receives these medications from a pain clinic or has a contract with another physician, these medications should be received from that clinic or physician only).    Future labs/tests needed:  CBC, BMP in 1-2 weeks PCP

## 2020-05-28 ENCOUNTER — Other Ambulatory Visit: Payer: Self-pay

## 2020-05-28 ENCOUNTER — Telehealth: Payer: Self-pay | Admitting: Internal Medicine

## 2020-05-28 DIAGNOSIS — M800AXS Age-related osteoporosis with current pathological fracture, other site, sequela: Secondary | ICD-10-CM | POA: Diagnosis not present

## 2020-05-28 DIAGNOSIS — D649 Anemia, unspecified: Secondary | ICD-10-CM | POA: Diagnosis not present

## 2020-05-28 DIAGNOSIS — Z48815 Encounter for surgical aftercare following surgery on the digestive system: Secondary | ICD-10-CM | POA: Diagnosis not present

## 2020-05-28 DIAGNOSIS — I82452 Acute embolism and thrombosis of left peroneal vein: Secondary | ICD-10-CM | POA: Diagnosis not present

## 2020-05-28 DIAGNOSIS — I82451 Acute embolism and thrombosis of right peroneal vein: Secondary | ICD-10-CM | POA: Diagnosis not present

## 2020-05-28 DIAGNOSIS — I129 Hypertensive chronic kidney disease with stage 1 through stage 4 chronic kidney disease, or unspecified chronic kidney disease: Secondary | ICD-10-CM | POA: Diagnosis not present

## 2020-05-28 DIAGNOSIS — I82441 Acute embolism and thrombosis of right tibial vein: Secondary | ICD-10-CM | POA: Diagnosis not present

## 2020-05-28 DIAGNOSIS — I2690 Septic pulmonary embolism without acute cor pulmonale: Secondary | ICD-10-CM | POA: Diagnosis not present

## 2020-05-28 DIAGNOSIS — I82431 Acute embolism and thrombosis of right popliteal vein: Secondary | ICD-10-CM | POA: Diagnosis not present

## 2020-05-28 NOTE — Telephone Encounter (Signed)
LMOM for Ariel Medina, w/ verbal orders.

## 2020-05-28 NOTE — Telephone Encounter (Signed)
Caller : Sean  Call Back # [phone]  Per Hilliard Clark, he is requesting verbal order for PT (physical therapy) .  Frequency  : Once a week for 1 week  2x6 1x2  Please Advise

## 2020-05-28 NOTE — Patient Outreach (Signed)
[facility]) Care Management  05/28/2020  NAVI EWTON 1928/04/07 [id number]   Referral Date: 05/28/20 Referral Source: [employer] Report Date of Discharge: 05/26/20 Facility:  [place]: [facility]   Referral received.  No outreach warranted at this time.  Transition of Care calls being completed via EMMI. RN CM will outreach patient for any red flags received.    Plan: RN CM will close case.    Ulrich Soules Durwin Reges, RN, MSN

## 2020-05-29 ENCOUNTER — Other Ambulatory Visit: Payer: Self-pay

## 2020-05-29 NOTE — Telephone Encounter (Signed)
Spoke w/ Sean-okay to stop OT per Pt request.

## 2020-05-29 NOTE — Telephone Encounter (Signed)
Ariel Medina states patient is not taking the following medication  Tylenol Gabapentin Oxycodone

## 2020-05-29 NOTE — Telephone Encounter (Signed)
Ariel Medina  Requesting Verbal Order   Patient is requesting to d/c occupational therapy. She feels she does not need that part of therapy at this point

## 2020-05-29 NOTE — Telephone Encounter (Signed)
Med list updated

## 2020-05-30 ENCOUNTER — Ambulatory Visit (INDEPENDENT_AMBULATORY_CARE_PROVIDER_SITE_OTHER): Payer: Medicare HMO | Admitting: Pulmonary Disease

## 2020-05-30 ENCOUNTER — Encounter: Payer: Self-pay | Admitting: Pulmonary Disease

## 2020-05-30 ENCOUNTER — Other Ambulatory Visit: Payer: Self-pay

## 2020-05-30 VITALS — BP 120/70 | HR 72 | Temp 97.6°F | Ht 63.0 in | Wt 121.0 lb

## 2020-05-30 DIAGNOSIS — I2699 Other pulmonary embolism without acute cor pulmonale: Secondary | ICD-10-CM | POA: Diagnosis not present

## 2020-05-30 DIAGNOSIS — I824Y3 Acute embolism and thrombosis of unspecified deep veins of proximal lower extremity, bilateral: Secondary | ICD-10-CM | POA: Diagnosis not present

## 2020-05-30 DIAGNOSIS — I8290 Acute embolism and thrombosis of unspecified vein: Secondary | ICD-10-CM | POA: Diagnosis not present

## 2020-05-30 DIAGNOSIS — R918 Other nonspecific abnormal finding of lung field: Secondary | ICD-10-CM

## 2020-05-30 NOTE — Assessment & Plan Note (Addendum)
September/2021-bilateral PE, bilateral dvt  Plan: Maintained on Eliquis Continue to have lower extremity swelling, consider compression socks or elevate legs May need to consider referral to hematology

## 2020-05-30 NOTE — Assessment & Plan Note (Signed)
Plan: Continue Eliquis We will plan on repeating lower extremity Dopplers in December/2021

## 2020-05-30 NOTE — Progress Notes (Signed)
_0  ID: [name], female    DOB: 09-19-1927, 86 y.o.   MRN: [id number]  Chief Complaint  Patient presents with  . Hospitalization Follow-up    Lung nodule, feet and legs swelling, still having diarheea    Referring provider: Colon Branch, MD  HPI:  86 year old female never smoker initially consulted with our practice when hospitalized in September/2021.  Patient found to have bilateral pulmonary embolisms as well as a abnormal CT showing pulmonary nodule  PMH: Allergic rhinitis, hypothyroidism, hyperlipidemia,  Smoker/ Smoking History: Never smoker Maintenance:  none Pt of: Needs outpatient pulmonary provider  05/30/2020  - Visit   86 year old female never smoker initially consulted with our practice when hospitalized in September/2021.  She was admitted on 04/29/2020 and discharged on 05/07/2020.  When hospitalized she was consulted with our team on 04/30/2020 due to bilateral pulmonary embolisms found on CTA of chest.  There is also an irregular pulmonary nodule which radiology recommended follow-up with a repeat CT scan in 3 to 4 weeks.  Patient presenting today to discuss this.  Discharge summary from 05/07/2020 is listed below:  Admit date: 04/29/2020 Discharge date: 05/07/2020  The original discharge summary was completed by Dr Louanne Belton. She did not discharge on 9/28 as planned. I have evaluated her today and she remains stable for discharge. I have refreshed the d/c summary to reflect today's discharge date. NO other changes were made.   Admitted From: Home  Discharge disposition: SNF  Recommendations for Outpatient Follow-Up:    Follow up with your primary care provider at the skilled nursing facility in 3 to 5 days  Check CBC, BMP, magnesium in the next visit  Follow-up with general surgery as has been scheduled.  Patient does have pulmonary opacity on the CT scan.  Patient will need repeat CT scan in 3 to 4 weeks to ensure resolution of opacity.  Patient  was diagnosed to have a DVT/ pulmonary embolism.  Will need at least 3 to 6 months of anticoagulation.  Reassess after 3 months.   Discharge Diagnosis:   Principal Problem:   Pulmonary embolism, bilateral (HCC) Active Problems:   Hypokalemia   Essential hypertension   Intussusception (HCC)   GI bleed   Abnormal CT scan of lung   Hypoxia   Acute pulmonary embolism (HCC)   Goals of care, counseling/discussion   Palliative care by specialist   DNR (do not resuscitate) discussion     History of Present Illness:   Natina A [name] a 86 y.o.femalewith medical history significant forhistory of breast cancer, diverticulitis, hyperlipidemia, hypertension, hypothyroidism, osteoarthritis, osteopenia, CKD, who presented to the emergency department with complaints of worsening diarrhea for 2 weeks with some bright red stool.Patient also had decreased appetite. She was seen a few days ago in her PCP office for evaluation and had negative stool cultures and labs at that time. Patient was revisited by PCP again for heme positive stools and was also noted to be hypoxic. In the emergency room, patient was noted to have bilateral PEs on CT angiography of her chest. She also had a left upper lobe opacity which could represent a focal inflammation versus a underlying neoplasm. She was started on empiric antibiotics for possible pneumonia. She was also noted to have hematochezia and therefore was not placed on anticoagulation. CT of her abdomen revealed intussusception of her terminal ileum and right-sided colon. General surgery and GI were consulted.   Hospital Course:   Following conditions were addressed during hospitalization as listed  below,  Intussusception with mild rectal bleeding. CT scan of the abdomen with intusseption. Status post open extended right hemicolectomy on 05/01/2020. On soft diet and has tolerated well..No definite mass was reported in the operative  findings in the colon. Pathology has been sent and is pending.   General surgery has seen the patient today and okay for discharge  Acute hypoxic respiratory failure secondary to bilateral pulmonary embolism, no right heart strain. CT scan of the chest showed 16 x 11 mm irregular solid density in the left upper lobe. Completed empiric antibiotic for possible pneumonia for 5 days.Ultrasound of the lower extremity shows acute DVT involving the right popliteal vein right posterior tibial vein and right peroneal vein and left peroneal veins.Oneliquis for anticoagulation,tolerating well.  Will need to continue anticoagulation for at least 3 months  Bilateral lower extremity DVT.On Eliquis anticoagulation.  Continue anticoagulation for at least 3 months.  Reassess further need with primary care physician.  Hypokalemia.Improved.   Mild hypomagnesemia. Improved, latest magnesium 1.7   Hypophosphatemia. Replenished.  Latest phosphorus of 2.7   Essential hypertension On hydrochlorothiazide at home.   Restarted on discharge   Abnormal CT scan of lung 16 x 11 mm irregular solid density in the left upper lobe.Patient will need repeat CT scan in 3 to 4 weeks to ensure resolution of opacity.  Disposition.  At this time, patient is stable for disposition to skilled nursing facility.  Spoke with the patient's granddaughter on the phone and updated her about the plan for disposition.  When consulted with our team with Burman Nieves, NP as well as Dr. Tamala Julian plan was as follows:  04/30/20 P:  - Check LE duplex, if positive and plans post op to keep off AC for extended period of time, would place retrievable IVC filter - Hold on SCDs until LE duplex done - On trial of AC now, watch H/H and hematochezia - No further pulmonary testing needs to be done prior to planned procedure - We will schedule 1 month f/u in pulmonary clinic to re eval nodule - Available PRN  Patient had to  present back to the hospital on 05/08/2020.  She was discharged on 05/12/2020.  That discharge summary is listed below:  Admit date: 05/08/2020 Discharge date: 05/12/2020  Admitted From: SNF  Disposition:  SNF   Recommendations for Outpatient Follow-up:  1. Follow up with [facility] on 10/7 for follow up at 10AM and again with Dr. Ninfa Linden on 10/19  2. Continue Eliquis 3. Repeat CBC in 1 week 4. Call [facility] to advance diet  Home Health: N/A  Equipment/Devices: TBD at SNF  Discharge Condition: Fair  CODE STATUS: FULL Diet recommendation: Soft  Brief/Interim Summary: [name] is a48 y.o.F with HTN, hypothyroidism, remote BrCA, and then a recent admission for intussusception requiring right hemicolectomy 9/23 as well as DVT and PE.  Started on Eliquis during that admission, discharged 1 day prior to this admission, readmitted with rectal bleeding.  In the ER, patient's Hgb was 9.9 g/dL and she was actively bleeding. She was given tranexamic acid only, and admitted.  PRINCIPAL HOSPITAL DIAGNOSIS: Acute GI bleed    Discharge Diagnoses:   AcuteGI bleeddue to suspected anastomotic bleeding Given Kcentra, Eliquis held.  Admitted to floor, Gen Surg consulted.  Suspected to be anastomotic bleeding.  Bleeding resolved after about 24 hours.  Anticoagulation was restarted cautiously with heparin, patient had no further bleeding, had a Lindroth BM so Eliquis was restarted and she was monitored  for 24 hours without recurrence of bleeding.     Acute blood lossanemia Transfused 1 uniton hospital day 1, no further bleeding, Hgb remained stable.  Acutepulmonary embolismand bilateral DVT Continue Eliquis  Hypothyroidism Continue levothyroxine  Polyneuropathy Continue gabapentin  Hyperlipidemia Continue Crestor  Hypertension Blood pressure normal off meds.  Hold hydrochlorothiazide until needed.   86 year old female smoker  presenting to office today for hospital follow-up.  Patient reports that she feels that her breathing is improved since being discharged from the hospital.  She continues to have lower extremity swelling.  It is sometimes painful to the touch, patient continues to be maintained on Eliquis.  Patient denies any previous history of VTE events.  She does continue to have persistent lower extremity swelling.  She is not on any diuretics.  She will follow up with primary care on Monday/2021.  Patient would like to review when she is due for a follow-up CT chest    Tests:   FENO:  No results found for: NITRICOXIDE  PFT: No flowsheet data found.  WALK:  No flowsheet data found.  Imaging: CT ABDOMEN PELVIS WO CONTRAST  Result Date: 05/08/2020 CLINICAL DATA:  Recent partial colectomy.  Gastrointestinal bleeding EXAM: CT ABDOMEN AND PELVIS WITHOUT CONTRAST TECHNIQUE: Multidetector CT imaging of the abdomen and pelvis was performed following the standard protocol without oral or IV contrast. COMPARISON:  April 29, 2020 FINDINGS: Lower chest: There are pleural effusions bilaterally. There is compressive atelectasis in the lung bases with suspected superimposed degree of pneumonia in the posterior left base. There is a sizable hiatal type hernia present. There is a suspected lipoma within the hiatal hernia measuring 7 x 7 mm. Hepatobiliary: No focal liver lesions are evident on this noncontrast enhanced study. The gallbladder wall is not appreciably thickened. There is no biliary duct dilatation. Pancreas: There is a partially calcified apparent mass arising along the rightward aspect of the head of the pancreas eccentrically measuring 1.7 x 1.7 x 1.7 cm. No other pancreatic lesion evident. Spleen: No splenic lesions are appreciable. Adrenals/Urinary Tract: Adrenals bilaterally appear normal. There is a cyst arising from the posterior mid right kidney measuring 1.7 x 1.7 cm. There is a cyst lobulated cyst  arising from the lower pole the left kidney laterally measuring 2.6 x 2.5 cm. There is no appreciable hydronephrosis on either side. There is no appreciable renal or ureteral calculus on either side. Urinary bladder is midline with wall thickness within normal limits. Stomach/Bowel: Postoperative changes noted in the transverse colon region proximally with patent anastomoses. No fluid collection or extraluminal air seen in this area of recent surgery. Slight soft tissue stranding in this area is likely of postoperative etiology. There are multiple sigmoid diverticula without demonstrable diverticulitis. There is no demonstrable bowel obstruction. The terminal ileum region appears normal. There is no demonstrable free air or portal venous air. Vascular/Lymphatic: No abdominal aortic aneurysm. There is aortic and iliac artery atherosclerosis. There is a left retroaortic renal vein, an anatomic variant. No adenopathy evident in the abdomen or pelvis. Reproductive: Uterus is anteverted.  There is no adnexal mass. Other: There is no appreciable periappendiceal region inflammation. No abscess or ascites is evident in the abdomen or pelvis. Musculoskeletal: Bones are osteoporotic. There is degenerative change in each hip joint. There is also degenerative change in the lumbar spine. No blastic or lytic bone lesions. There is no intramuscular or abdominal wall lesion evident. IMPRESSION: 1. Status post removal of previous lesion in the colon near the hepatic  flexure involving the proximal transverse colon. Anastomosis patent in this area. Slight soft tissue stranding in this area is felt to be due to recent surgery. There is no evidence suggesting perforation or abscess. No fluid is seen surrounding this area of recent surgery. 2.  Sigmoid diverticula without diverticulitis. 3. Pleural effusions bilaterally. Compressive atelectasis in the lung bases with suspected focus of pneumonia in the posterior left base. 4. Sizable  hiatal hernia. Small lipoma noted within the rightward aspect of this hiatal hernia. 5.  No bowel obstruction.  No abscess in the abdomen or pelvis. 6. 1.7 x 1.7 x 1.7 cm partially calcified mass arising eccentrically from the pancreatic head. Significance of this finding in this age group is uncertain. Particular attention this area on subsequent evaluations is warranted. Pancreas otherwise appears normal. 7.  Aortic Atherosclerosis (ICD10-I70.0). Comment: A site for gastrointestinal bleeding has not been established with this study. If bleeding continues, nuclear medicine gastrointestinal bleeding study may be helpful for further assessment and potential localization of bleeding site. Electronically Signed   By: Lowella Grip III M.D.   On: 05/08/2020 08:19   ECHOCARDIOGRAM COMPLETE  Result Date: 05/08/2020    ECHOCARDIOGRAM REPORT   Patient Name:   YOLANDO GILLUM Date of Exam: 05/08/2020 Medical Rec #:  [id number]       Height:       62.0 in Accession #:    [id number]      Weight:       138.0 lb Date of Birth:  Feb 18, 1928        BSA:          1.633 m Patient Age:    16 years        BP:           128/74 mmHg Patient Gender: F               HR:           92 bpm. Exam Location:  Inpatient Procedure: 2D Echo, Cardiac Doppler and Color Doppler Indications:    R94.31 Abnormal EKG  History:        Patient has no prior history of Echocardiogram examinations.                 COPD, Signs/Symptoms:Syncope; Risk Factors:Hypertension,                 Dyslipidemia and Diabetes.  Sonographer:    Bernadene Person RDCS Referring Phys: [id number] [place]  1. Septal motion is abnormal likely due to conduction abnormality. . Left ventricular ejection fraction, by estimation, is 60 to 65%. The left ventricle has normal function. The left ventricle has no regional wall motion abnormalities. There is mild concentric left ventricular hypertrophy. Left ventricular diastolic parameters are consistent with Grade I  diastolic dysfunction (impaired relaxation).  2. Right ventricular systolic function is normal. The right ventricular size is normal. There is mildly elevated pulmonary artery systolic pressure.  3. The mitral valve is normal in structure. Trivial mitral valve regurgitation.  4. The aortic valve is tricuspid. There is mild calcification of the aortic valve. There is mild thickening of the aortic valve. Aortic valve regurgitation is trivial. Mild aortic valve sclerosis is present, with no evidence of aortic valve stenosis.  5. The inferior vena cava is normal in size with greater than 50% respiratory variability, suggesting right atrial pressure of 3 mmHg. Comparison(s): No prior Echocardiogram. FINDINGS  Left Ventricle: Septal motion is abnormal likely due to conduction  abnormality. Left ventricular ejection fraction, by estimation, is 60 to 65%. The left ventricle has normal function. The left ventricle has no regional wall motion abnormalities. The left ventricular internal cavity size was normal in size. There is mild concentric left ventricular hypertrophy. Left ventricular diastolic parameters are consistent with Grade I diastolic dysfunction (impaired relaxation). Right Ventricle: The right ventricular size is normal. No increase in right ventricular wall thickness. Right ventricular systolic function is normal. There is mildly elevated pulmonary artery systolic pressure. The tricuspid regurgitant velocity is 3.10  m/s, and with an assumed right atrial pressure of 3 mmHg, the estimated right ventricular systolic pressure is 70.3 mmHg. Left Atrium: Left atrial size was normal in size. Right Atrium: Right atrial size was normal in size. Pericardium: There is no evidence of pericardial effusion. Mitral Valve: The mitral valve is normal in structure. There is mild thickening of the mitral valve leaflet(s). Mild mitral annular calcification. Trivial mitral valve regurgitation. Tricuspid Valve: The tricuspid valve  is normal in structure. Tricuspid valve regurgitation is mild. Aortic Valve: The aortic valve is tricuspid. There is mild calcification of the aortic valve. There is mild thickening of the aortic valve. Aortic valve regurgitation is trivial. Mild aortic valve sclerosis is present, with no evidence of aortic valve stenosis. Pulmonic Valve: The pulmonic valve was normal in structure. Pulmonic valve regurgitation is not visualized. Aorta: The aortic root and ascending aorta are structurally normal, with no evidence of dilitation. Venous: The inferior vena cava is normal in size with greater than 50% respiratory variability, suggesting right atrial pressure of 3 mmHg. IAS/Shunts: No atrial level shunt detected by color flow Doppler.  LEFT VENTRICLE PLAX 2D LVIDd:         3.00 cm  Diastology LVIDs:         2.10 cm  LV e' medial:    4.68 cm/s LV PW:         1.20 cm  LV E/e' medial:  15.3 LV IVS:        1.20 cm  LV e' lateral:   5.22 cm/s LVOT diam:     1.80 cm  LV E/e' lateral: 13.7 LV SV:         74 LV SV Index:   46 LVOT Area:     2.54 cm  RIGHT VENTRICLE RV S prime:     11.80 cm/s TAPSE (M-mode): 1.9 cm LEFT ATRIUM           Index       RIGHT ATRIUM           Index LA diam:      1.90 cm 1.16 cm/m  RA Area:     12.70 cm LA Vol (A2C): 28.4 ml 17.39 ml/m RA Volume:   31.10 ml  19.05 ml/m LA Vol (A4C): 25.2 ml 15.43 ml/m  AORTIC VALVE LVOT Vmax:   156.00 cm/s LVOT Vmean:  113.000 cm/s LVOT VTI:    0.292 m  AORTA Ao Root diam: 2.90 cm Ao Asc diam:  3.00 cm MITRAL VALVE                TRICUSPID VALVE MV Area (PHT): 4.83 cm     TR Peak grad:   38.4 mmHg MV Decel Time: 157 msec     TR Vmax:        310.00 cm/s MV E velocity: 71.65 cm/s MV A velocity: 148.00 cm/s  SHUNTS MV E/A ratio:  0.48  Systemic VTI:  0.29 m                             Systemic Diam: 1.80 cm Gwyndolyn Kaufman MD Electronically signed by Gwyndolyn Kaufman MD Signature Date/Time: 05/08/2020/2:25:58 PM    Final     Lab Results:  CBC      Component Value Date/Time   WBC 6.8 05/12/2020 0330   RBC 2.78 (L) 05/12/2020 0330   HGB 8.5 (L) 05/12/2020 0330   HGB 13.0 11/11/2011 1013   HGB 14.4 10/05/2007 1009   HCT 27.0 (L) 05/12/2020 0330   HCT 39.3 11/11/2011 1013   HCT 41.5 10/05/2007 1009   PLT 254 05/12/2020 0330   PLT 280 11/11/2011 1013   PLT 270 10/05/2007 1009   MCV 97.1 05/12/2020 0330   MCV 87 11/11/2011 1013   MCV 92.2 10/05/2007 1009   MCH 30.6 05/12/2020 0330   MCHC 31.5 05/12/2020 0330   RDW 14.7 05/12/2020 0330   RDW 14.1 11/11/2011 1013   RDW 13.3 10/05/2007 1009   LYMPHSABS 1.1 04/29/2020 1204   LYMPHSABS 1.2 11/11/2011 1013   LYMPHSABS 1.5 10/05/2007 1009   MONOABS 0.8 04/29/2020 1204   MONOABS 0.6 10/05/2007 1009   EOSABS 0.1 04/29/2020 1204   EOSABS 0.1 11/11/2011 1013   BASOSABS 0.0 04/29/2020 1204   BASOSABS 0.0 11/11/2011 1013   BASOSABS 0.0 10/05/2007 1009    BMET    Component Value Date/Time   NA 141 05/12/2020 0330   NA 140 12/06/2016 0000   K 4.1 05/12/2020 0330   CL 102 05/12/2020 0330   CO2 33 (H) 05/12/2020 0330   GLUCOSE 103 (H) 05/12/2020 0330   BUN 11 05/12/2020 0330   BUN 14 12/06/2016 0000   CREATININE 0.61 05/12/2020 0330   CREATININE 0.64 04/25/2020 1620   CALCIUM 8.6 (L) 05/12/2020 0330   GFRNONAA >60 05/12/2020 0330   GFRAA >60 05/12/2020 0330    BNP No results found for: BNP  ProBNP No results found for: PROBNP  Specialty Problems      Pulmonary Problems   Allergic rhinitis   Hypoxia      Allergies  Allergen Reactions  . Ibuprofen     REACTION: bleed    Immunization History  Administered Date(s) Administered  . H1N1 08/29/2008  . Influenza Whole 05/12/2010  . Influenza, High Dose Seasonal PF 06/07/2013, 05/13/2015, 07/20/2016, 04/20/2017, 05/25/2018, 05/28/2019  . Influenza,inj,Quad PF,6+ Mos 06/10/2014  . PFIZER SARS-COV-2 Vaccination 09/30/2019, 10/24/2019  . PPD Test 12/14/2016  . Pneumococcal Conjugate-13 06/10/2014  . Pneumococcal  Polysaccharide-23 07/10/2003, 02/21/2008, 11/23/2017  . Td 05/16/2007, 05/25/2018  . Zoster 12/13/2012  . Zoster Recombinat (Shingrix) 10/12/2018, 06/28/2019    Past Medical History:  Diagnosis Date  . Anemia   . Breast CA ([place])    left breast-invasive ductal ca stage 1-stopped arimidex 2008  . Diverticulitis of colon   . GI bleed 04/2020  . Hiatal hernia   . Hyperlipidemia   . Hypertension   . Hypothyroidism   . Osteoarthritis   . Osteopenia    DEXA 7/02  . Syncope 2005   CT head (-), ECHO essent. neg, stress test (-), carotid u/s (-)    Tobacco History: Social History   Tobacco Use  Smoking Status Never Smoker  Smokeless Tobacco Never Used   Counseling given: Not Answered   Continue to not smoke  Outpatient Encounter Medications as of 05/30/2020  Medication Sig  . apixaban (  ELIQUIS) 5 MG TABS tablet Take 1 tablet (5 mg total) by mouth 2 (two) times daily.  . bisacodyl (DULCOLAX) 10 MG suppository Place 1 suppository (10 mg total) rectally as needed for moderate constipation.  . feeding supplement, ENSURE ENLIVE, (ENSURE ENLIVE) LIQD Take 237 mLs by mouth 2 (two) times daily between meals.  . fluticasone (FLONASE) 50 MCG/ACT nasal spray Place 2 sprays into both nostrils daily as needed for allergies or rhinitis.  Marland Kitchen levothyroxine (SYNTHROID) 75 MCG tablet Take 1 tablet (75 mcg total) by mouth daily before breakfast.  . Multiple Vitamins-Minerals (ICAPS AREDS 2 PO) Take 1 capsule by mouth 2 (two) times daily.  . polyethylene glycol (MIRALAX / GLYCOLAX) 17 g packet Take 17 g by mouth daily as needed for moderate constipation.  . potassium chloride (KLOR-CON) 10 MEQ tablet Take 1 tablet (10 mEq total) by mouth daily.  . potassium chloride (KLOR-CON) 10 MEQ tablet   . psyllium (HYDROCIL/METAMUCIL) 95 % PACK Take 1 packet by mouth daily. Hold for diarrhoea  . rosuvastatin (CRESTOR) 5 MG tablet Take 1 tablet (5 mg total) by mouth 3 (three) times a week. No specific days    No facility-administered encounter medications on file as of 05/30/2020.     Review of Systems  Review of Systems  Constitutional: Negative for activity change, fatigue and fever.  HENT: Negative for sinus pressure, sinus pain and sore throat.   Respiratory: Negative for cough, shortness of breath and wheezing.   Cardiovascular: Positive for leg swelling. Negative for chest pain and palpitations.  Gastrointestinal: Negative for diarrhea, nausea and vomiting.  Musculoskeletal: Negative for arthralgias.  Neurological: Negative for dizziness.  Psychiatric/Behavioral: Negative for sleep disturbance. The patient is not nervous/anxious.      Physical Exam  BP 120/70 (BP Location: Left Arm, Cuff Size: Normal)   Pulse 72   Temp 97.6 F (36.4 C) (Oral)   Ht _0  (1.6 m)   Wt 121 lb (54.9 kg)   SpO2 98%   BMI 21.43 kg/m   Wt Readings from Last 5 Encounters:  05/30/20 121 lb (54.9 kg)  05/26/20 138 lb 11.2 oz (62.9 kg)  05/13/20 138 lb 10.7 oz (62.9 kg)  05/08/20 138 lb 10.7 oz (62.9 kg)  05/07/20 138 lb 0.1 oz (62.6 kg)    BMI Readings from Last 5 Encounters:  05/30/20 21.43 kg/m  05/26/20 25.37 kg/m  05/13/20 25.36 kg/m  05/08/20 25.36 kg/m  05/07/20 25.24 kg/m     Physical Exam    Assessment & Plan:   VTE (venous thromboembolism) September/2021-bilateral PE, bilateral dvt  Plan: Maintained on Eliquis Continue to have lower extremity swelling, consider compression socks or elevate legs May need to consider referral to hematology  Pulmonary embolism, bilateral ([place]) Plan: Continue Eliquis  Acute deep vein thrombosis (DVT) of proximal vein of both lower extremities ([place]) Plan: Continue Eliquis We will plan on repeating lower extremity Dopplers in December/2021  Abnormal findings on diagnostic imaging of lung Plan: We will order CT chest to evaluate previously seen pulmonary nodule Set up in nodule slot with Dr. Valeta Harms PCC's work to get the  patient scheduled for a CT the same day as her primary care office visit per her request    Return in about 10 days (around 06/09/2020), or if symptoms worsen or fail to improve, for Follow up with Dr. Valeta Harms, [place], Follow up with Dr. Lamonte Sakai.   Lauraine Rinne, NP 05/30/2020   This appointment required 34 minutes of patient  care (this includes precharting, chart review, review of results, face-to-face care, etc.).

## 2020-05-30 NOTE — Assessment & Plan Note (Signed)
Plan: Continue Eliquis 

## 2020-05-30 NOTE — Assessment & Plan Note (Signed)
Plan: We will order CT chest to evaluate previously seen pulmonary nodule Set up in nodule slot with Dr. Valeta Harms PCC's work to get the patient scheduled for a CT the same day as her primary care office visit per her request

## 2020-05-30 NOTE — Patient Instructions (Addendum)
You were seen today by Lauraine Rinne, NP  for:   1. Abnormal findings on diagnostic imaging of lung  - CT Chest Wo Contrast; Future  We will obtain a CT of your chest on 06/02/2020  We will get you set up for close follow-up November/08/2019  2. Pulmonary embolism, bilateral ([place]) 3. Acute deep vein thrombosis (DVT) of proximal vein of both lower extremities (HCC) 4. VTE (venous thromboembolism)  Continue Eliquis  We will plan on repeating lower extremity Dopplers in December/2021  May need to consider hematology referral before stopping Eliquis   We recommend today:  Orders Placed This Encounter  Procedures  . CT Chest Wo Contrast    Standing Status:   Future    Standing Expiration Date:   05/30/2021    Scheduling Instructions:     Prefers - [facility] location     [facility] on 10/25 at [facility] if able to coordinate    Order Specific Question:   Preferred imaging location?    Answer:   [facility]   Orders Placed This Encounter  Procedures  . CT Chest Wo Contrast  . VAS [place] LOWER EXTREMITY VENOUS (DVT)   No orders of the defined types were placed in this encounter.   Follow Up:    Return in about 10 days (around 06/09/2020), or if symptoms worsen or fail to improve, for Follow up with Dr. Valeta Harms, [place], Follow up with Dr. Lamonte Sakai.   Notification of test results are managed in the following manner: If there are  any recommendations or changes to the  plan of care discussed in office today,  we will contact you and let you know what they are. If you do not hear from [place], then your results are normal and you can view them through your  MyChart account , or a letter will be sent to you. Thank you again for trusting [place] with your care  - Thank you, [facility]    It is flu season:   >>> Best ways to protect herself from the flu: Receive the yearly flu vaccine, practice good hand hygiene washing with soap and also using hand sanitizer  when available, eat a nutritious meals, get adequate rest, hydrate appropriately       Please contact the office if your symptoms worsen or you have concerns that you are not improving.   Thank you for choosing [facility] for your healthcare, and for allowing [place] to partner with you on your healthcare journey. I am thankful to be able to provide care to you today.   Wyn Quaker FNP-C

## 2020-05-31 NOTE — Progress Notes (Signed)
PCCM: Thanks, glad to establish care Ariel Nash, DO [facility] 05/31/2020 5:38 PM

## 2020-06-02 ENCOUNTER — Ambulatory Visit (INDEPENDENT_AMBULATORY_CARE_PROVIDER_SITE_OTHER): Payer: Medicare HMO | Admitting: Internal Medicine

## 2020-06-02 ENCOUNTER — Ambulatory Visit (HOSPITAL_BASED_OUTPATIENT_CLINIC_OR_DEPARTMENT_OTHER)
Admission: RE | Admit: 2020-06-02 | Discharge: 2020-06-02 | Disposition: A | Discharge status: Home / Self Care | Payer: Medicare HMO | Source: Ambulatory Visit | Attending: Pulmonary Disease | Admitting: Pulmonary Disease

## 2020-06-02 ENCOUNTER — Encounter: Payer: Self-pay | Admitting: Internal Medicine

## 2020-06-02 ENCOUNTER — Other Ambulatory Visit: Payer: Self-pay

## 2020-06-02 VITALS — BP 131/82 | HR 75 | Temp 98.2°F | Resp 18 | Ht 62.0 in | Wt 120.1 lb

## 2020-06-02 DIAGNOSIS — I7789 Other specified disorders of arteries and arterioles: Secondary | ICD-10-CM | POA: Diagnosis not present

## 2020-06-02 DIAGNOSIS — Z23 Encounter for immunization: Secondary | ICD-10-CM

## 2020-06-02 DIAGNOSIS — D649 Anemia, unspecified: Secondary | ICD-10-CM

## 2020-06-02 DIAGNOSIS — R634 Abnormal weight loss: Secondary | ICD-10-CM

## 2020-06-02 DIAGNOSIS — I10 Essential (primary) hypertension: Secondary | ICD-10-CM

## 2020-06-02 DIAGNOSIS — R918 Other nonspecific abnormal finding of lung field: Secondary | ICD-10-CM | POA: Insufficient documentation

## 2020-06-02 DIAGNOSIS — E039 Hypothyroidism, unspecified: Secondary | ICD-10-CM | POA: Diagnosis not present

## 2020-06-02 DIAGNOSIS — J40 Bronchitis, not specified as acute or chronic: Secondary | ICD-10-CM | POA: Diagnosis not present

## 2020-06-02 DIAGNOSIS — J90 Pleural effusion, not elsewhere classified: Secondary | ICD-10-CM | POA: Diagnosis not present

## 2020-06-02 DIAGNOSIS — I2510 Atherosclerotic heart disease of native coronary artery without angina pectoris: Secondary | ICD-10-CM | POA: Diagnosis not present

## 2020-06-02 NOTE — Patient Instructions (Addendum)
Check the  blood pressure once a week BP GOAL is between 110/65 and  135/85. If it is consistently higher or lower, let me know   Drink plenty of fluids, be sure to get Ensure at least once daily  Call if you have diarrhea or other problems   Get your Covid vaccination booster at your earliest [facility] : Get the blood work     Rayle, [place] back for a checkup in 3 months

## 2020-06-02 NOTE — Progress Notes (Signed)
Subjective:    Patient ID: Ariel Medina, female    DOB: October 30, 1927, 86 y.o.   MRN: [id number]  DOS:  06/02/2020 Type of visit - description: Follow-up  Since the last office visit she was admitted to the hospital twice First admission 04/28/2020: dx intussusception with rectal bleeding, partial colectomy 05/01/2020. Bilateral DVTs, PE with respiratory failure.  Electrolyte disbalance CT lung abnormal with a solid density at the LUL Was discharged to SNF  Readmitted 05/08/2020: Had an acute GI bleed suspected to be from the recent colectomy anastomosis. Was transfused 1 unit She continue with Eliquis for recent PE, DVT. BP was noted to be okay despite holding hydrochlorothiazide. Was discharged to the [facility] 05/12/2020   Saw pulmonary 3 days ago, they recommended continue Eliquis, repeat lower extremity Dopplers 07/2020 and ordered a CT.  Wt Readings from Last 3 Encounters:  06/02/20 120 lb 2 oz (54.5 kg)  05/30/20 121 lb (54.9 kg)  05/26/20 138 lb 11.2 oz (62.9 kg)    Review of Systems She is here with her granddaughter Ariel Medina. Since she left the SNF she is at home and doing well. Appetite is somewhat decreased. No fever chills Denies any postprandial abdominal pain, nausea, vomiting. Occasionally has diarrhea but denies blood in the stools. Mentally is doing great. No gross hematuria. Mild lower extremity edema at the end of the day  Past Medical History:  Diagnosis Date  . Anemia   . Breast CA ([place])    left breast-invasive ductal ca stage 1-stopped arimidex 2008  . Diverticulitis of colon   . GI bleed 04/2020  . Hiatal hernia   . Hyperlipidemia   . Hypertension   . Hypothyroidism   . Osteoarthritis   . Osteopenia    DEXA 7/02  . Syncope 2005   CT head (-), ECHO essent. neg, stress test (-), carotid u/s (-)    Past Surgical History:  Procedure Laterality Date  . BREAST BIOPSY Bilateral   . BREAST LUMPECTOMY  04/2002   left  . PARTIAL COLECTOMY N/A  05/01/2020   Procedure: PARTIAL COLECTOMY;  Surgeon: Coralie Keens, MD;  Location: [facility];  Service: General;  Laterality: N/A;  . TONSILLECTOMY AND ADENOIDECTOMY      Allergies as of 06/02/2020      Reactions   Ibuprofen    REACTION: bleed      Medication List       Accurate as of June 02, 2020 11:59 PM. If you have any questions, ask your nurse or doctor.        STOP taking these medications   potassium chloride 10 MEQ tablet Commonly known as: KLOR-CON Stopped by: Kathlene November, MD     TAKE these medications   apixaban 5 MG Tabs tablet Commonly known as: ELIQUIS Take 1 tablet (5 mg total) by mouth 2 (two) times daily.   bisacodyl 10 MG suppository Commonly known as: DULCOLAX Place 1 suppository (10 mg total) rectally as needed for moderate constipation.   feeding supplement Liqd Take 237 mLs by mouth 2 (two) times daily between meals.   fluticasone 50 MCG/ACT nasal spray Commonly known as: FLONASE Place 2 sprays into both nostrils daily as needed for allergies or rhinitis.   ICAPS AREDS 2 PO Take 1 capsule by mouth 2 (two) times daily.   levothyroxine 75 MCG tablet Commonly known as: SYNTHROID Take 1 tablet (75 mcg total) by mouth daily before breakfast.   polyethylene glycol 17 g packet Commonly known as: MIRALAX / GLYCOLAX  Take 17 g by mouth daily as needed for moderate constipation.   potassium chloride 10 MEQ tablet Commonly known as: KLOR-CON Take 1 tablet (10 mEq total) by mouth daily.   psyllium 95 % Pack Commonly known as: HYDROCIL/METAMUCIL Take 1 packet by mouth daily. Hold for diarrhoea   rosuvastatin 5 MG tablet Commonly known as: CRESTOR Take 1 tablet (5 mg total) by mouth 3 (three) times a week. No specific days          Objective:   Physical Exam BP 131/82 (BP Location: Right Arm, Patient Position: Sitting, Cuff Size: Small)   Pulse 75   Temp 98.2 F (36.8 C) (Oral)   Resp 18   Ht 5\' 2"  (1.575 m)   Wt 120 lb 2 oz (54.5 kg)    SpO2 93%   BMI 21.97 kg/m  General:   Well developed, NAD, BMI noted.  HEENT:  Normocephalic . Face symmetric, atraumatic Lungs:  CTA B Normal respiratory effort, no intercostal retractions, no accessory muscle use. Heart: RRR,  no murmur.  Abdomen:  Not distended, soft, non-tender. No rebound or rigidity.   Skin: Not pale. Not jaundice Lower extremities: Trace periankle and pretibial edema bilaterally  Neurologic:  alert & oriented X3.  Speech normal, gait: Not tested Psych--  Cognition and judgment appear intact.  Cooperative with normal attention span and concentration.  Behavior appropriate. No anxious or depressed appearing.     Assessment      Assessment  HTN Hyperlipidemia Hypothyroidism Osteopenia:  --T score -2.1 (04-2012) DC Fosamax 12/2013 after 5 years --T score -1.5   (02-2015)  --T score -3.6 (april 2019) on Prolia DJD GERD  Tremors, likely essential H/o diverticulitis Breast cancer, L breast, release from oncology 2013 Syncope 2005 11-2016: Fall, FX L pubic ramus, admitted then d/c to a NH, back home 12-18-16 Edema, R>L U/S (-)  for DVT 01/2017  PLAN: Since the last office visit, was admitted twice to the hospital, subsequently discharged to SNF.  At home since approximately 05/25/2020. DVT, PE: Since the last office visit was diagnosed with DVT and PE, seems to be doing okay on anticoagulants. Intussusception, status post hemicolectomy, she seems to be recovering well.  Has seen her surgeon on follow-up and they seemed satisfied per patient. HTN: HCTZ was d/c during the most recent admission, still on potassium.  Check a BMP, consider stop potassium. At the same time,  will consider restart HCTZ and potassium if BP is elevated or if she has excessive swelling. Weight loss: Understandably given recent surgery.  Encourage Ensure and good nutrition. Hypothyroidism: Last TSH is slightly elevated, good compliance with thyroid medication.  Recheck  TSH. Anemia: Check CBC and iron Preventive care: Flu shot today, encourage a Covid booster Social: Has good family support, sister lives next door, granddaughter lives 15 minutes away. RTC 3 months    This visit occurred during the SARS-CoV-2 public health emergency.  Safety protocols were in place, including screening questions prior to the visit, additional usage of staff PPE, and extensive cleaning of exam room while observing appropriate contact time as indicated for disinfecting solutions.

## 2020-06-02 NOTE — Progress Notes (Signed)
Pre visit review using our clinic review tool, if applicable. No additional management support is needed unless otherwise documented below in the visit note. 

## 2020-06-03 LAB — COMPREHENSIVE METABOLIC PANEL
AG Ratio: 1.6 (calc) (ref 1.0–2.5)
ALT: 4 U/L — ABNORMAL LOW (ref 6–29)
AST: 14 U/L (ref 10–35)
Albumin: 3.9 g/dL (ref 3.6–5.1)
Alkaline phosphatase (APISO): 53 U/L (ref 37–153)
BUN: 8 mg/dL (ref 7–25)
CO2: 33 mmol/L — ABNORMAL HIGH (ref 20–32)
Calcium: 10 mg/dL (ref 8.6–10.4)
Chloride: 98 mmol/L (ref 98–110)
Creat: 0.67 mg/dL (ref 0.60–0.88)
Globulin: 2.5 g/dL (calc) (ref 1.9–3.7)
Glucose, Bld: 92 mg/dL (ref 65–99)
Potassium: 4.1 mmol/L (ref 3.5–5.3)
Sodium: 139 mmol/L (ref 135–146)
Total Bilirubin: 0.5 mg/dL (ref 0.2–1.2)
Total Protein: 6.4 g/dL (ref 6.1–8.1)

## 2020-06-03 LAB — IRON, TOTAL/TOTAL IRON BINDING CAP
%SAT: 6 % (calc) — ABNORMAL LOW (ref 16–45)
Iron: 20 ug/dL — ABNORMAL LOW (ref 45–160)
TIBC: 361 mcg/dL (calc) (ref 250–450)

## 2020-06-03 LAB — FERRITIN: Ferritin: 26 ng/mL (ref 16–288)

## 2020-06-03 LAB — CBC WITH DIFFERENTIAL/PLATELET
Absolute Monocytes: 481 cells/uL (ref 200–950)
Basophils Absolute: 22 cells/uL (ref 0–200)
Basophils Relative: 0.4 %
Eosinophils Absolute: 81 cells/uL (ref 15–500)
Eosinophils Relative: 1.5 %
HCT: 32.8 % — ABNORMAL LOW (ref 35.0–45.0)
Hemoglobin: 10.6 g/dL — ABNORMAL LOW (ref 11.7–15.5)
Lymphs Abs: 1312 cells/uL (ref 850–3900)
MCH: 29.4 pg (ref 27.0–33.0)
MCHC: 32.3 g/dL (ref 32.0–36.0)
MCV: 91.1 fL (ref 80.0–100.0)
MPV: 9.1 fL (ref 7.5–12.5)
Monocytes Relative: 8.9 %
Neutro Abs: 3505 cells/uL (ref 1500–7800)
Neutrophils Relative %: 64.9 %
Platelets: 348 10*3/uL (ref 140–400)
RBC: 3.6 10*6/uL — ABNORMAL LOW (ref 3.80–5.10)
RDW: 13.3 % (ref 11.0–15.0)
Total Lymphocyte: 24.3 %
WBC: 5.4 10*3/uL (ref 3.8–10.8)

## 2020-06-03 LAB — TSH: TSH: 3.92 mIU/L (ref 0.40–4.50)

## 2020-06-03 NOTE — Assessment & Plan Note (Signed)
Since the last office visit, was admitted twice to the hospital, subsequently discharged to SNF.  At home since approximately 05/25/2020. DVT, PE: Since the last office visit was diagnosed with DVT and PE, seems to be doing okay on anticoagulants. Intussusception, status post hemicolectomy, she seems to be recovering well.  Has seen her surgeon on follow-up and they seemed satisfied per patient. HTN: HCTZ was d/c during the most recent admission, still on potassium.  Check a BMP, consider stop potassium. At the same time,  will consider restart HCTZ and potassium if BP is elevated or if she has excessive swelling. Weight loss: Understandably given recent surgery.  Encourage Ensure and good nutrition. Hypothyroidism: Last TSH is slightly elevated, good compliance with thyroid medication.  Recheck TSH. Anemia: Check CBC and iron Preventive care: Flu shot today, encourage a Covid booster Social: Has good family support, sister lives next door, granddaughter lives 15 minutes away. RTC 3 months

## 2020-06-04 ENCOUNTER — Other Ambulatory Visit: Payer: Self-pay | Admitting: Pulmonary Disease

## 2020-06-04 ENCOUNTER — Telehealth: Payer: Self-pay

## 2020-06-04 DIAGNOSIS — N189 Chronic kidney disease, unspecified: Secondary | ICD-10-CM

## 2020-06-04 DIAGNOSIS — I82441 Acute embolism and thrombosis of right tibial vein: Secondary | ICD-10-CM | POA: Diagnosis not present

## 2020-06-04 DIAGNOSIS — D649 Anemia, unspecified: Secondary | ICD-10-CM | POA: Diagnosis not present

## 2020-06-04 DIAGNOSIS — E785 Hyperlipidemia, unspecified: Secondary | ICD-10-CM

## 2020-06-04 DIAGNOSIS — I82452 Acute embolism and thrombosis of left peroneal vein: Secondary | ICD-10-CM | POA: Diagnosis not present

## 2020-06-04 DIAGNOSIS — Z7901 Long term (current) use of anticoagulants: Secondary | ICD-10-CM

## 2020-06-04 DIAGNOSIS — I82451 Acute embolism and thrombosis of right peroneal vein: Secondary | ICD-10-CM | POA: Diagnosis not present

## 2020-06-04 DIAGNOSIS — Z7951 Long term (current) use of inhaled steroids: Secondary | ICD-10-CM

## 2020-06-04 DIAGNOSIS — M800AXS Age-related osteoporosis with current pathological fracture, other site, sequela: Secondary | ICD-10-CM | POA: Diagnosis not present

## 2020-06-04 DIAGNOSIS — Z9049 Acquired absence of other specified parts of digestive tract: Secondary | ICD-10-CM

## 2020-06-04 DIAGNOSIS — R918 Other nonspecific abnormal finding of lung field: Secondary | ICD-10-CM

## 2020-06-04 DIAGNOSIS — M1990 Unspecified osteoarthritis, unspecified site: Secondary | ICD-10-CM

## 2020-06-04 DIAGNOSIS — M8580 Other specified disorders of bone density and structure, unspecified site: Secondary | ICD-10-CM

## 2020-06-04 DIAGNOSIS — E039 Hypothyroidism, unspecified: Secondary | ICD-10-CM

## 2020-06-04 DIAGNOSIS — Z48815 Encounter for surgical aftercare following surgery on the digestive system: Secondary | ICD-10-CM | POA: Diagnosis not present

## 2020-06-04 DIAGNOSIS — I82431 Acute embolism and thrombosis of right popliteal vein: Secondary | ICD-10-CM | POA: Diagnosis not present

## 2020-06-04 DIAGNOSIS — I2690 Septic pulmonary embolism without acute cor pulmonale: Secondary | ICD-10-CM | POA: Diagnosis not present

## 2020-06-04 DIAGNOSIS — I129 Hypertensive chronic kidney disease with stage 1 through stage 4 chronic kidney disease, or unspecified chronic kidney disease: Secondary | ICD-10-CM | POA: Diagnosis not present

## 2020-06-04 NOTE — Telephone Encounter (Signed)
Plan of care signed and faxed back to [facility] at [phone]. Form sent for scanning.

## 2020-06-06 DIAGNOSIS — I129 Hypertensive chronic kidney disease with stage 1 through stage 4 chronic kidney disease, or unspecified chronic kidney disease: Secondary | ICD-10-CM | POA: Diagnosis not present

## 2020-06-06 DIAGNOSIS — D649 Anemia, unspecified: Secondary | ICD-10-CM | POA: Diagnosis not present

## 2020-06-06 DIAGNOSIS — I2690 Septic pulmonary embolism without acute cor pulmonale: Secondary | ICD-10-CM | POA: Diagnosis not present

## 2020-06-06 DIAGNOSIS — Z48815 Encounter for surgical aftercare following surgery on the digestive system: Secondary | ICD-10-CM | POA: Diagnosis not present

## 2020-06-06 DIAGNOSIS — I82451 Acute embolism and thrombosis of right peroneal vein: Secondary | ICD-10-CM | POA: Diagnosis not present

## 2020-06-06 DIAGNOSIS — I82452 Acute embolism and thrombosis of left peroneal vein: Secondary | ICD-10-CM | POA: Diagnosis not present

## 2020-06-06 DIAGNOSIS — I82441 Acute embolism and thrombosis of right tibial vein: Secondary | ICD-10-CM | POA: Diagnosis not present

## 2020-06-06 DIAGNOSIS — I82431 Acute embolism and thrombosis of right popliteal vein: Secondary | ICD-10-CM | POA: Diagnosis not present

## 2020-06-06 DIAGNOSIS — M800AXS Age-related osteoporosis with current pathological fracture, other site, sequela: Secondary | ICD-10-CM | POA: Diagnosis not present

## 2020-06-09 DIAGNOSIS — I82452 Acute embolism and thrombosis of left peroneal vein: Secondary | ICD-10-CM | POA: Diagnosis not present

## 2020-06-09 DIAGNOSIS — Z48815 Encounter for surgical aftercare following surgery on the digestive system: Secondary | ICD-10-CM | POA: Diagnosis not present

## 2020-06-09 DIAGNOSIS — I82431 Acute embolism and thrombosis of right popliteal vein: Secondary | ICD-10-CM | POA: Diagnosis not present

## 2020-06-09 DIAGNOSIS — I2690 Septic pulmonary embolism without acute cor pulmonale: Secondary | ICD-10-CM | POA: Diagnosis not present

## 2020-06-09 DIAGNOSIS — I82451 Acute embolism and thrombosis of right peroneal vein: Secondary | ICD-10-CM | POA: Diagnosis not present

## 2020-06-09 DIAGNOSIS — I82441 Acute embolism and thrombosis of right tibial vein: Secondary | ICD-10-CM | POA: Diagnosis not present

## 2020-06-09 DIAGNOSIS — D649 Anemia, unspecified: Secondary | ICD-10-CM | POA: Diagnosis not present

## 2020-06-09 DIAGNOSIS — I129 Hypertensive chronic kidney disease with stage 1 through stage 4 chronic kidney disease, or unspecified chronic kidney disease: Secondary | ICD-10-CM | POA: Diagnosis not present

## 2020-06-09 DIAGNOSIS — M800AXS Age-related osteoporosis with current pathological fracture, other site, sequela: Secondary | ICD-10-CM | POA: Diagnosis not present

## 2020-06-11 ENCOUNTER — Ambulatory Visit (INDEPENDENT_AMBULATORY_CARE_PROVIDER_SITE_OTHER): Payer: Medicare HMO | Admitting: Pulmonary Disease

## 2020-06-11 ENCOUNTER — Other Ambulatory Visit: Payer: Self-pay

## 2020-06-11 ENCOUNTER — Encounter: Payer: Self-pay | Admitting: Pulmonary Disease

## 2020-06-11 VITALS — BP 122/70 | HR 86 | Temp 97.2°F | Ht 63.0 in | Wt 121.4 lb

## 2020-06-11 DIAGNOSIS — Z48815 Encounter for surgical aftercare following surgery on the digestive system: Secondary | ICD-10-CM | POA: Diagnosis not present

## 2020-06-11 DIAGNOSIS — I82451 Acute embolism and thrombosis of right peroneal vein: Secondary | ICD-10-CM | POA: Diagnosis not present

## 2020-06-11 DIAGNOSIS — R918 Other nonspecific abnormal finding of lung field: Secondary | ICD-10-CM

## 2020-06-11 DIAGNOSIS — D649 Anemia, unspecified: Secondary | ICD-10-CM | POA: Diagnosis not present

## 2020-06-11 DIAGNOSIS — I129 Hypertensive chronic kidney disease with stage 1 through stage 4 chronic kidney disease, or unspecified chronic kidney disease: Secondary | ICD-10-CM | POA: Diagnosis not present

## 2020-06-11 DIAGNOSIS — I2699 Other pulmonary embolism without acute cor pulmonale: Secondary | ICD-10-CM

## 2020-06-11 DIAGNOSIS — Z7901 Long term (current) use of anticoagulants: Secondary | ICD-10-CM | POA: Diagnosis not present

## 2020-06-11 DIAGNOSIS — I8290 Acute embolism and thrombosis of unspecified vein: Secondary | ICD-10-CM | POA: Diagnosis not present

## 2020-06-11 DIAGNOSIS — I82452 Acute embolism and thrombosis of left peroneal vein: Secondary | ICD-10-CM | POA: Diagnosis not present

## 2020-06-11 DIAGNOSIS — M800AXS Age-related osteoporosis with current pathological fracture, other site, sequela: Secondary | ICD-10-CM | POA: Diagnosis not present

## 2020-06-11 DIAGNOSIS — I2690 Septic pulmonary embolism without acute cor pulmonale: Secondary | ICD-10-CM | POA: Diagnosis not present

## 2020-06-11 DIAGNOSIS — R911 Solitary pulmonary nodule: Secondary | ICD-10-CM | POA: Diagnosis not present

## 2020-06-11 DIAGNOSIS — I82431 Acute embolism and thrombosis of right popliteal vein: Secondary | ICD-10-CM | POA: Diagnosis not present

## 2020-06-11 DIAGNOSIS — I82441 Acute embolism and thrombosis of right tibial vein: Secondary | ICD-10-CM | POA: Diagnosis not present

## 2020-06-11 NOTE — Progress Notes (Signed)
Synopsis: Referred in November 2021 for lung nodule by Colon Branch, MD  Subjective:   PATIENT ID: Ariel Medina GENDER: female DOB: 25-Sep-1927, MRN: [id number]  Chief Complaint  Patient presents with  . Follow-up    Patient had CT done on 10/25, shortness of breath with exertion but is getting better with therapy. Blood clots in lungs admitted on 9/21.     This is a 86 year old female, past medical history of breast cancer, stage I several years ago, history of GI bleeding, hyperlipidemia, hypertension.  Patient had a recent hospitalization for intussusception.  Patient found to have a colon lipoma causing intussusception.  She was taken to the operating room for partial colectomy and anastomosis.  She had bilateral pulmonary emboli pulmonary was consulted.  Patient was trialed on anticoagulation during the hospitalization.  Patient was discharged from the hospital on 05/06/2020 discharge summary was reviewed by Dr. Wynelle Cleveland.  In addition she did have bilateral lower extremity DVT.  CT scan of the chest on admission revealed a 16 x 11 mm irregular solid density within the left upper lobe.  Ultimately the patient had hospital follow-up in our clinic at discharge with Wyn Quaker, NP.  Recommended continued Eliquis for DVT/PE.  Planned noncontrasted CT chest follow-up to ensure resolution of lung nodule.  She had this repeat noncontrasted CT of the chest on 06/02/2020.  There was persistence of this opacity in the left lung apex measuring 0.9 x 2 cm in size.   Past Medical History:  Diagnosis Date  . Anemia   . Breast CA ([place])    left breast-invasive ductal ca stage 1-stopped arimidex 2008  . Diverticulitis of colon   . GI bleed 04/2020  . Hiatal hernia   . Hyperlipidemia   . Hypertension   . Hypothyroidism   . Osteoarthritis   . Osteopenia    DEXA 7/02  . Syncope 2005   CT head (-), ECHO essent. neg, stress test (-), carotid u/s (-)     Family History  Problem Relation Age of Onset    . Heart attack Brother 94  . Diabetes Mother   . Stroke Father 48  . Stroke Sister 67  . Colon cancer Neg Hx   . Breast cancer Neg Hx      Past Surgical History:  Procedure Laterality Date  . BREAST BIOPSY Bilateral   . BREAST LUMPECTOMY  04/2002   left  . PARTIAL COLECTOMY N/A 05/01/2020   Procedure: PARTIAL COLECTOMY;  Surgeon: Coralie Keens, MD;  Location: [facility];  Service: General;  Laterality: N/A;  . TONSILLECTOMY AND ADENOIDECTOMY      Social History   Socioeconomic History  . Marital status: Widowed    Spouse name: Not on file  . Number of children: 2  . Years of education: Not on file  . Highest education level: Not on file  Occupational History  . Occupation: n/a    Employer: RETIRED  Tobacco Use  . Smoking status: Never Smoker  . Smokeless tobacco: Never Used  Vaping Use  . Vaping Use: Never used  Substance and Sexual Activity  . Alcohol use: No  . Drug use: No  . Sexual activity: Not Currently  Other Topics Concern  . Not on file  Social History Narrative   Lost a son 37   G-son  live w/ her from time to time, not permanently   G-daughter [name] lives 15 min away    Sister live next door , does help the  pt    G-son @ [employer] , G-daughter @ [employer] since 1993   still drives, short distances, typically not at night.   Social Determinants of Health   Financial Resource Strain: Low Risk   . Difficulty of Paying Living Expenses: Not hard at all  Food Insecurity: No Food Insecurity  . Worried About Charity fundraiser in the Last Year: Never true  . Ran Out of Food in the Last Year: Never true  Transportation Needs: No Transportation Needs  . Lack of Transportation (Medical): No  . Lack of Transportation (Non-Medical): No  Physical Activity:   . Days of Exercise per Week: Not on file  . Minutes of Exercise per Session: Not on file  Stress: No Stress Concern Present  . Feeling of Stress : Only a little  Social Connections:   .  Frequency of Communication with Friends and Family: Not on file  . Frequency of Social Gatherings with Friends and Family: Not on file  . Attends Religious Services: Not on file  . Active Member of Clubs or Organizations: Not on file  . Attends Archivist Meetings: Not on file  . Marital Status: Not on file  Intimate Partner Violence:   . Fear of Current or Ex-Partner: Not on file  . Emotionally Abused: Not on file  . Physically Abused: Not on file  . Sexually Abused: Not on file     Allergies  Allergen Reactions  . Ibuprofen     REACTION: bleed     Outpatient Medications Prior to Visit  Medication Sig Dispense Refill  . apixaban (ELIQUIS) 5 MG TABS tablet Take 1 tablet (5 mg total) by mouth 2 (two) times daily. 60 tablet 0  . bisacodyl (DULCOLAX) 10 MG suppository Place 1 suppository (10 mg total) rectally as needed for moderate constipation. 12 suppository 0  . feeding supplement, ENSURE ENLIVE, (ENSURE ENLIVE) LIQD Take 237 mLs by mouth 2 (two) times daily between meals. 237 mL 12  . fluticasone (FLONASE) 50 MCG/ACT nasal spray Place 2 sprays into both nostrils daily as needed for allergies or rhinitis. 48 g 0  . levothyroxine (SYNTHROID) 75 MCG tablet Take 1 tablet (75 mcg total) by mouth daily before breakfast. 90 tablet 0  . Multiple Vitamins-Minerals (ICAPS AREDS 2 PO) Take 1 capsule by mouth 2 (two) times daily.    . polyethylene glycol (MIRALAX / GLYCOLAX) 17 g packet Take 17 g by mouth daily as needed for moderate constipation. 14 each 0  . potassium chloride (KLOR-CON) 10 MEQ tablet Take 1 tablet (10 mEq total) by mouth daily. 90 tablet 0  . psyllium (HYDROCIL/METAMUCIL) 95 % PACK Take 1 packet by mouth daily. Hold for diarrhoea 240 each   . rosuvastatin (CRESTOR) 5 MG tablet Take 1 tablet (5 mg total) by mouth 3 (three) times a week. No specific days 12 tablet 0   No facility-administered medications prior to visit.    Review of Systems  Constitutional:  Negative for chills, fever, malaise/fatigue and weight loss.  HENT: Negative for hearing loss, sore throat and tinnitus.   Eyes: Negative for blurred vision and double vision.  Respiratory: Negative for cough, hemoptysis, sputum production, shortness of breath, wheezing and stridor.   Cardiovascular: Negative for chest pain, palpitations, orthopnea, leg swelling and PND.  Gastrointestinal: Negative for abdominal pain, constipation, diarrhea, heartburn, nausea and vomiting.  Genitourinary: Negative for dysuria, hematuria and urgency.  Musculoskeletal: Negative for joint pain and myalgias.  Skin: Negative  for itching and rash.  Neurological: Negative for dizziness, tingling, weakness and headaches.  Endo/Heme/Allergies: Negative for environmental allergies. Does not bruise/bleed easily.  Psychiatric/Behavioral: Negative for depression. The patient is not nervous/anxious and does not have insomnia.   All other systems reviewed and are negative.    Objective:  Physical Exam Vitals reviewed.  Constitutional:      General: She is not in acute distress.    Appearance: She is well-developed.  HENT:     Head: Normocephalic and atraumatic.  Eyes:     General: No scleral icterus.    Conjunctiva/sclera: Conjunctivae normal.     Pupils: Pupils are equal, round, and reactive to light.  Neck:     Vascular: No JVD.     Trachea: No tracheal deviation.  Cardiovascular:     Rate and Rhythm: Normal rate and regular rhythm.     Heart sounds: Normal heart sounds. No murmur heard.   Pulmonary:     Effort: Pulmonary effort is normal. No tachypnea, accessory muscle usage or respiratory distress.     Breath sounds: Normal breath sounds. No stridor. No wheezing, rhonchi or rales.  Abdominal:     General: Bowel sounds are normal. There is no distension.     Palpations: Abdomen is soft.     Tenderness: There is no abdominal tenderness.  Musculoskeletal:        General: No tenderness.     Cervical  back: Neck supple.  Lymphadenopathy:     Cervical: No cervical adenopathy.  Skin:    General: Skin is warm and dry.     Capillary Refill: Capillary refill takes less than 2 seconds.     Findings: No rash.  Neurological:     Mental Status: She is alert and oriented to person, place, and time.  Psychiatric:        Behavior: Behavior normal.      Vitals:   06/11/20 1044  BP: 122/70  Pulse: 86  Temp: (!) 97.2 F (36.2 C)  TempSrc: Temporal  SpO2: 93%  Weight: 121 lb 6.4 oz (55.1 kg)  Height: 5\' 3"  (1.6 m)   93% on RA BMI Readings from Last 3 Encounters:  06/11/20 21.51 kg/m  06/02/20 21.97 kg/m  05/30/20 21.43 kg/m   Wt Readings from Last 3 Encounters:  06/11/20 121 lb 6.4 oz (55.1 kg)  06/02/20 120 lb 2 oz (54.5 kg)  05/30/20 121 lb (54.9 kg)     CBC    Component Value Date/Time   WBC 5.4 06/02/2020 1215   RBC 3.60 (L) 06/02/2020 1215   HGB 10.6 (L) 06/02/2020 1215   HGB 13.0 11/11/2011 1013   HGB 14.4 10/05/2007 1009   HCT 32.8 (L) 06/02/2020 1215   HCT 39.3 11/11/2011 1013   HCT 41.5 10/05/2007 1009   PLT 348 06/02/2020 1215   PLT 280 11/11/2011 1013   PLT 270 10/05/2007 1009   MCV 91.1 06/02/2020 1215   MCV 87 11/11/2011 1013   MCV 92.2 10/05/2007 1009   MCH 29.4 06/02/2020 1215   MCHC 32.3 06/02/2020 1215   RDW 13.3 06/02/2020 1215   RDW 14.1 11/11/2011 1013   RDW 13.3 10/05/2007 1009   LYMPHSABS 1,312 06/02/2020 1215   LYMPHSABS 1.2 11/11/2011 1013   LYMPHSABS 1.5 10/05/2007 1009   MONOABS 0.8 04/29/2020 1204   MONOABS 0.6 10/05/2007 1009   EOSABS 81 06/02/2020 1215   EOSABS 0.1 11/11/2011 1013   BASOSABS 22 06/02/2020 1215   BASOSABS 0.0 11/11/2011 1013  BASOSABS 0.0 10/05/2007 1009     Chest Imaging: 06/02/2020 CT chest: Persistent 0.9 x 2 cm spiculated nodule of the left upper lobe.  Some peripheral interstitial changes concerning for fibrosis. The patient's images have been independently reviewed by me.    Pulmonary Functions  Testing Results: No flowsheet data found.  FeNO:   Pathology:   Echocardiogram:   1. Septal motion is abnormal likely due to conduction abnormality. . Left  ventricular ejection fraction, by estimation, is 60 to 65%. The left  ventricle has normal function. The left ventricle has no regional wall  motion abnormalities. There is mild  concentric left ventricular hypertrophy. Left ventricular diastolic  parameters are consistent with Grade I diastolic dysfunction (impaired  relaxation).  2. Right ventricular systolic function is normal. The right ventricular  size is normal. There is mildly elevated pulmonary artery systolic  pressure.  3. The mitral valve is normal in structure. Trivial mitral valve  regurgitation.  4. The aortic valve is tricuspid. There is mild calcification of the  aortic valve. There is mild thickening of the aortic valve. Aortic valve  regurgitation is trivial. Mild aortic valve sclerosis is present, with no  evidence of aortic valve stenosis.  5. The inferior vena cava is normal in size with greater than 50%  respiratory variability, suggesting right atrial pressure of 3 mmHg.   Heart Catheterization:     Assessment & Plan:     ICD-10-CM   1. Left upper lobe pulmonary nodule  R91.1   2. Abnormal CT scan of lung  R91.8   3. Pulmonary embolism, bilateral (HCC)  I26.99   4. VTE (venous thromboembolism)  I82.90   5. On continuous oral anticoagulation  Z79.01     Discussion: This is a 86 year old female who was recently hospitalized for bilateral PE as well as intussusception secondary to a colon lipoma status post colectomy with primary anastomosis.  Patient has done well since her surgery and hospitalization.  During this time had a CT scan of the chest which revealed an incidental left upper lobe pulmonary nodule.  It is large in size but is associated with an inflammatory appearing infiltrate along the periphery of the lung.  I am unsure if this  represents malignancy or a resolving inflammatory lesion.  Today in the office we discussed all of the potentials of this lesion and what we should do with it.  Being a more aggressive approach considering early tissue diagnosis versus watchful waiting with images.  Patient was accompanied today with her granddaughter.  Plan: After a thorough discussion of the risk benefits and alternatives we have agreed upon pursuing a nuclear medicine PET scan.  This nuclear medicine PET scan will help [place] determine whether or not we would need to consider biopsy. I explained that if there was a high PET uptake within the lesion then we could very well be dealing with a primary malignancy. If there was low-level uptake then I think we can consider watching it with a repeat noncontrasted CT of the chest in 6 months.  If needed we discussed the possibility of invasive tissue diagnosis and obtaining this via navigational bronchoscopy if needed.  At this time we would like to consider conservative follow-up and additional imaging before making any decisions for tissue diagnosis.  I believe this is an appropriate step.   Current Outpatient Medications:  .  apixaban (ELIQUIS) 5 MG TABS tablet, Take 1 tablet (5 mg total) by mouth 2 (two) times daily., Disp: 60 tablet,  Rfl: 0 .  bisacodyl (DULCOLAX) 10 MG suppository, Place 1 suppository (10 mg total) rectally as needed for moderate constipation., Disp: 12 suppository, Rfl: 0 .  feeding supplement, ENSURE ENLIVE, (ENSURE ENLIVE) LIQD, Take 237 mLs by mouth 2 (two) times daily between meals., Disp: 237 mL, Rfl: 12 .  fluticasone (FLONASE) 50 MCG/ACT nasal spray, Place 2 sprays into both nostrils daily as needed for allergies or rhinitis., Disp: 48 g, Rfl: 0 .  levothyroxine (SYNTHROID) 75 MCG tablet, Take 1 tablet (75 mcg total) by mouth daily before breakfast., Disp: 90 tablet, Rfl: 0 .  Multiple Vitamins-Minerals (ICAPS AREDS 2 PO), Take 1 capsule by mouth 2 (two) times  daily., Disp: , Rfl:  .  polyethylene glycol (MIRALAX / GLYCOLAX) 17 g packet, Take 17 g by mouth daily as needed for moderate constipation., Disp: 14 each, Rfl: 0 .  potassium chloride (KLOR-CON) 10 MEQ tablet, Take 1 tablet (10 mEq total) by mouth daily., Disp: 90 tablet, Rfl: 0 .  psyllium (HYDROCIL/METAMUCIL) 95 % PACK, Take 1 packet by mouth daily. Hold for diarrhoea, Disp: 240 each, Rfl:  .  rosuvastatin (CRESTOR) 5 MG tablet, Take 1 tablet (5 mg total) by mouth 3 (three) times a week. No specific days, Disp: 12 tablet, Rfl: 0  I spent 60 minutes dedicated to the care of this patient on the date of this encounter to include pre-visit review of records, face-to-face time with the patient discussing conditions above, post visit ordering of testing, clinical documentation with the electronic health record, making appropriate referrals as documented, and communicating necessary findings to members of the patients care team.   Garner Nash, DO [facility] 06/11/2020 11:04 AM

## 2020-06-11 NOTE — Patient Instructions (Addendum)
Thank you for visiting Dr. Valeta Harms at [facility]. Today we recommend the following:  Orders Placed This Encounter  Procedures  . NM PET Image Initial (PI) Skull Base To Thigh   Return in about 15 days (around 06/26/2020) for Dr. Valeta Harms .  To go over PET scan results.    Please do your part to reduce the spread of COVID-19.

## 2020-06-14 ENCOUNTER — Other Ambulatory Visit: Payer: Self-pay | Admitting: Internal Medicine

## 2020-06-14 DIAGNOSIS — E039 Hypothyroidism, unspecified: Secondary | ICD-10-CM

## 2020-06-16 ENCOUNTER — Telehealth: Payer: Self-pay | Admitting: Internal Medicine

## 2020-06-16 DIAGNOSIS — M800AXS Age-related osteoporosis with current pathological fracture, other site, sequela: Secondary | ICD-10-CM | POA: Diagnosis not present

## 2020-06-16 DIAGNOSIS — Z48815 Encounter for surgical aftercare following surgery on the digestive system: Secondary | ICD-10-CM | POA: Diagnosis not present

## 2020-06-16 DIAGNOSIS — I2690 Septic pulmonary embolism without acute cor pulmonale: Secondary | ICD-10-CM | POA: Diagnosis not present

## 2020-06-16 DIAGNOSIS — I129 Hypertensive chronic kidney disease with stage 1 through stage 4 chronic kidney disease, or unspecified chronic kidney disease: Secondary | ICD-10-CM | POA: Diagnosis not present

## 2020-06-16 DIAGNOSIS — I82452 Acute embolism and thrombosis of left peroneal vein: Secondary | ICD-10-CM | POA: Diagnosis not present

## 2020-06-16 DIAGNOSIS — I82451 Acute embolism and thrombosis of right peroneal vein: Secondary | ICD-10-CM | POA: Diagnosis not present

## 2020-06-16 DIAGNOSIS — I82431 Acute embolism and thrombosis of right popliteal vein: Secondary | ICD-10-CM | POA: Diagnosis not present

## 2020-06-16 DIAGNOSIS — D649 Anemia, unspecified: Secondary | ICD-10-CM | POA: Diagnosis not present

## 2020-06-16 DIAGNOSIS — I82441 Acute embolism and thrombosis of right tibial vein: Secondary | ICD-10-CM | POA: Diagnosis not present

## 2020-06-16 MED ORDER — POTASSIUM CHLORIDE CRYS ER 10 MEQ PO TBCR
10.0000 meq | EXTENDED_RELEASE_TABLET | Freq: Every day | ORAL | 1 refills | Status: DC
Start: 2020-06-16 — End: 2020-12-03

## 2020-06-16 NOTE — Telephone Encounter (Signed)
Rx sent 

## 2020-06-16 NOTE — Telephone Encounter (Signed)
Medication: potassium chloride (KLOR-CON) 10 MEQ tablet [[id number]]   Has the patient contacted their pharmacy? No. (If no, request that the patient contact the pharmacy for the refill.) (If yes, when and what did the pharmacy advise?)  Preferred Pharmacy (with phone number or street name):  [facility], [place] Phone:  [phone]  Fax:  [phone]       Agent: Please be advised that RX refills may take up to 3 business days. We ask that you follow-up with your pharmacy.

## 2020-06-18 ENCOUNTER — Ambulatory Visit (HOSPITAL_BASED_OUTPATIENT_CLINIC_OR_DEPARTMENT_OTHER): Payer: Medicare HMO

## 2020-06-19 DIAGNOSIS — I129 Hypertensive chronic kidney disease with stage 1 through stage 4 chronic kidney disease, or unspecified chronic kidney disease: Secondary | ICD-10-CM | POA: Diagnosis not present

## 2020-06-19 DIAGNOSIS — M800AXS Age-related osteoporosis with current pathological fracture, other site, sequela: Secondary | ICD-10-CM | POA: Diagnosis not present

## 2020-06-19 DIAGNOSIS — I82431 Acute embolism and thrombosis of right popliteal vein: Secondary | ICD-10-CM | POA: Diagnosis not present

## 2020-06-19 DIAGNOSIS — I82451 Acute embolism and thrombosis of right peroneal vein: Secondary | ICD-10-CM | POA: Diagnosis not present

## 2020-06-19 DIAGNOSIS — D649 Anemia, unspecified: Secondary | ICD-10-CM | POA: Diagnosis not present

## 2020-06-19 DIAGNOSIS — I82441 Acute embolism and thrombosis of right tibial vein: Secondary | ICD-10-CM | POA: Diagnosis not present

## 2020-06-19 DIAGNOSIS — Z48815 Encounter for surgical aftercare following surgery on the digestive system: Secondary | ICD-10-CM | POA: Diagnosis not present

## 2020-06-19 DIAGNOSIS — I2690 Septic pulmonary embolism without acute cor pulmonale: Secondary | ICD-10-CM | POA: Diagnosis not present

## 2020-06-19 DIAGNOSIS — I82452 Acute embolism and thrombosis of left peroneal vein: Secondary | ICD-10-CM | POA: Diagnosis not present

## 2020-06-24 ENCOUNTER — Encounter (HOSPITAL_COMMUNITY)
Admission: RE | Admit: 2020-06-24 | Discharge: 2020-06-24 | Disposition: A | Discharge status: Home / Self Care | Payer: Medicare HMO | Source: Ambulatory Visit | Attending: Pulmonary Disease | Admitting: Pulmonary Disease

## 2020-06-24 ENCOUNTER — Other Ambulatory Visit: Payer: Self-pay

## 2020-06-24 DIAGNOSIS — K449 Diaphragmatic hernia without obstruction or gangrene: Secondary | ICD-10-CM | POA: Diagnosis not present

## 2020-06-24 DIAGNOSIS — N281 Cyst of kidney, acquired: Secondary | ICD-10-CM | POA: Diagnosis not present

## 2020-06-24 DIAGNOSIS — J439 Emphysema, unspecified: Secondary | ICD-10-CM | POA: Diagnosis not present

## 2020-06-24 DIAGNOSIS — J90 Pleural effusion, not elsewhere classified: Secondary | ICD-10-CM | POA: Diagnosis not present

## 2020-06-24 DIAGNOSIS — R911 Solitary pulmonary nodule: Secondary | ICD-10-CM | POA: Insufficient documentation

## 2020-06-24 LAB — GLUCOSE, CAPILLARY: Glucose-Capillary: 93 mg/dL (ref 70–99)

## 2020-06-24 MED ORDER — FLUDEOXYGLUCOSE F - 18 (FDG) INJECTION
6.0600 | Freq: Once | INTRAVENOUS | Status: AC
  Administered 2020-06-24: 6.06 via INTRAVENOUS

## 2020-06-25 DIAGNOSIS — I82452 Acute embolism and thrombosis of left peroneal vein: Secondary | ICD-10-CM | POA: Diagnosis not present

## 2020-06-25 DIAGNOSIS — Z48815 Encounter for surgical aftercare following surgery on the digestive system: Secondary | ICD-10-CM | POA: Diagnosis not present

## 2020-06-25 DIAGNOSIS — I82441 Acute embolism and thrombosis of right tibial vein: Secondary | ICD-10-CM | POA: Diagnosis not present

## 2020-06-25 DIAGNOSIS — I129 Hypertensive chronic kidney disease with stage 1 through stage 4 chronic kidney disease, or unspecified chronic kidney disease: Secondary | ICD-10-CM | POA: Diagnosis not present

## 2020-06-25 DIAGNOSIS — I82451 Acute embolism and thrombosis of right peroneal vein: Secondary | ICD-10-CM | POA: Diagnosis not present

## 2020-06-25 DIAGNOSIS — D649 Anemia, unspecified: Secondary | ICD-10-CM | POA: Diagnosis not present

## 2020-06-25 DIAGNOSIS — M800AXS Age-related osteoporosis with current pathological fracture, other site, sequela: Secondary | ICD-10-CM | POA: Diagnosis not present

## 2020-06-25 DIAGNOSIS — I2690 Septic pulmonary embolism without acute cor pulmonale: Secondary | ICD-10-CM | POA: Diagnosis not present

## 2020-06-25 DIAGNOSIS — I82431 Acute embolism and thrombosis of right popliteal vein: Secondary | ICD-10-CM | POA: Diagnosis not present

## 2020-06-26 ENCOUNTER — Encounter: Payer: Self-pay | Admitting: Pulmonary Disease

## 2020-06-26 ENCOUNTER — Ambulatory Visit: Payer: Medicare HMO | Admitting: Pulmonary Disease

## 2020-06-26 ENCOUNTER — Other Ambulatory Visit: Payer: Self-pay

## 2020-06-26 VITALS — BP 134/80 | HR 80 | Temp 97.7°F | Wt 118.0 lb

## 2020-06-26 DIAGNOSIS — Z7901 Long term (current) use of anticoagulants: Secondary | ICD-10-CM

## 2020-06-26 DIAGNOSIS — R911 Solitary pulmonary nodule: Secondary | ICD-10-CM | POA: Diagnosis not present

## 2020-06-26 DIAGNOSIS — R918 Other nonspecific abnormal finding of lung field: Secondary | ICD-10-CM | POA: Diagnosis not present

## 2020-06-26 DIAGNOSIS — I2699 Other pulmonary embolism without acute cor pulmonale: Secondary | ICD-10-CM

## 2020-06-26 NOTE — Progress Notes (Signed)
Synopsis: Referred in November 2021 for lung nodule by Colon Branch, MD  Subjective:   PATIENT ID: Ariel Medina GENDER: female DOB: 01-20-1928, MRN: [id number]  Chief Complaint  Patient presents with  . Follow-up    review results of PET scan today    This is a 86 year old female, past medical history of breast cancer, stage I several years ago, history of GI bleeding, hyperlipidemia, hypertension.  Patient had a recent hospitalization for intussusception.  Patient found to have a colon lipoma causing intussusception.  She was taken to the operating room for partial colectomy and anastomosis.  She had bilateral pulmonary emboli pulmonary was consulted.  Patient was trialed on anticoagulation during the hospitalization.  Patient was discharged from the hospital on 05/06/2020 discharge summary was reviewed by Dr. Wynelle Cleveland.  In addition she did have bilateral lower extremity DVT.  CT scan of the chest on admission revealed a 16 x 11 mm irregular solid density within the left upper lobe.  Ultimately the patient had hospital follow-up in our clinic at discharge with Wyn Quaker, NP.  Recommended continued Eliquis for DVT/PE.  Planned noncontrasted CT chest follow-up to ensure resolution of lung nodule.  She had this repeat noncontrasted CT of the chest on 06/02/2020.  There was persistence of this opacity in the left lung apex measuring 0.9 x 2 cm in size.  OV 06/26/2020: Patient here today for follow-up regarding recent PET scan imaging of abnormal lung nodule.  This was completed and read yesterday.  Looking at the left upper lobe anterior pulmonary nodule is hypermetabolic with a SUV of 4.6.  Additionally there is a small AP window node that metabolic activity is present does not exclude metastatic disease.  Today we reviewed patient's patient's imaging as above.  Otherwise respiratory standpoint she has no complaints.   Past Medical History:  Diagnosis Date  . Anemia   . Breast CA ([place])    left  breast-invasive ductal ca stage 1-stopped arimidex 2008  . Diverticulitis of colon   . GI bleed 04/2020  . Hiatal hernia   . Hyperlipidemia   . Hypertension   . Hypothyroidism   . Osteoarthritis   . Osteopenia    DEXA 7/02  . Syncope 2005   CT head (-), ECHO essent. neg, stress test (-), carotid u/s (-)     Family History  Problem Relation Age of Onset  . Heart attack Brother 45  . Diabetes Mother   . Stroke Father 28  . Stroke Sister 67  . Colon cancer Neg Hx   . Breast cancer Neg Hx      Past Surgical History:  Procedure Laterality Date  . BREAST BIOPSY Bilateral   . BREAST LUMPECTOMY  04/2002   left  . PARTIAL COLECTOMY N/A 05/01/2020   Procedure: PARTIAL COLECTOMY;  Surgeon: Coralie Keens, MD;  Location: [facility];  Service: General;  Laterality: N/A;  . TONSILLECTOMY AND ADENOIDECTOMY      Social History   Socioeconomic History  . Marital status: Widowed    Spouse name: Not on file  . Number of children: 2  . Years of education: Not on file  . Highest education level: Not on file  Occupational History  . Occupation: n/a    Employer: RETIRED  Tobacco Use  . Smoking status: Never Smoker  . Smokeless tobacco: Never Used  Vaping Use  . Vaping Use: Never used  Substance and Sexual Activity  . Alcohol use: No  . Drug use: No  .  Sexual activity: Not Currently  Other Topics Concern  . Not on file  Social History Narrative   Lost a son 13   G-son  live w/ her from time to time, not permanently   G-daughter [name] lives 15 min away    Sister live next door , does help the pt    G-son @ [employer] , G-daughter @ [employer] since 1993   still drives, short distances, typically not at night.   Social Determinants of Health   Financial Resource Strain: Low Risk   . Difficulty of Paying Living Expenses: Not hard at all  Food Insecurity: No Food Insecurity  . Worried About Charity fundraiser in the Last Year: Never true  . Ran Out of Food in the  Last Year: Never true  Transportation Needs: No Transportation Needs  . Lack of Transportation (Medical): No  . Lack of Transportation (Non-Medical): No  Physical Activity:   . Days of Exercise per Week: Not on file  . Minutes of Exercise per Session: Not on file  Stress: No Stress Concern Present  . Feeling of Stress : Only a little  Social Connections:   . Frequency of Communication with Friends and Family: Not on file  . Frequency of Social Gatherings with Friends and Family: Not on file  . Attends Religious Services: Not on file  . Active Member of Clubs or Organizations: Not on file  . Attends Archivist Meetings: Not on file  . Marital Status: Not on file  Intimate Partner Violence:   . Fear of Current or Ex-Partner: Not on file  . Emotionally Abused: Not on file  . Physically Abused: Not on file  . Sexually Abused: Not on file     Allergies  Allergen Reactions  . Ibuprofen     REACTION: bleed     Outpatient Medications Prior to Visit  Medication Sig Dispense Refill  . apixaban (ELIQUIS) 5 MG TABS tablet Take 1 tablet (5 mg total) by mouth 2 (two) times daily. 60 tablet 0  . bisacodyl (DULCOLAX) 10 MG suppository Place 1 suppository (10 mg total) rectally as needed for moderate constipation. 12 suppository 0  . feeding supplement, ENSURE ENLIVE, (ENSURE ENLIVE) LIQD Take 237 mLs by mouth 2 (two) times daily between meals. 237 mL 12  . fluticasone (FLONASE) 50 MCG/ACT nasal spray Place 2 sprays into both nostrils daily as needed for allergies or rhinitis. 48 g 0  . levothyroxine (SYNTHROID) 75 MCG tablet Take 1 tablet (75 mcg total) by mouth daily before breakfast. 90 tablet 0  . Multiple Vitamins-Minerals (ICAPS AREDS 2 PO) Take 1 capsule by mouth 2 (two) times daily.    . polyethylene glycol (MIRALAX / GLYCOLAX) 17 g packet Take 17 g by mouth daily as needed for moderate constipation. 14 each 0  . potassium chloride (KLOR-CON) 10 MEQ tablet Take 1 tablet (10 mEq  total) by mouth daily. 90 tablet 1  . psyllium (HYDROCIL/METAMUCIL) 95 % PACK Take 1 packet by mouth daily. Hold for diarrhoea 240 each   . rosuvastatin (CRESTOR) 5 MG tablet Take 1 tablet (5 mg total) by mouth 3 (three) times a week. No specific days 12 tablet 0   No facility-administered medications prior to visit.    Review of Systems  Constitutional: Negative for chills, fever, malaise/fatigue and weight loss.  HENT: Negative for hearing loss, sore throat and tinnitus.   Eyes: Negative for blurred vision and double vision.  Respiratory: Negative  for cough, hemoptysis, sputum production, shortness of breath, wheezing and stridor.   Cardiovascular: Negative for chest pain, palpitations, orthopnea, leg swelling and PND.  Gastrointestinal: Negative for abdominal pain, constipation, diarrhea, heartburn, nausea and vomiting.  Genitourinary: Negative for dysuria, hematuria and urgency.  Musculoskeletal: Negative for joint pain and myalgias.  Skin: Negative for itching and rash.  Neurological: Negative for dizziness, tingling, weakness and headaches.  Endo/Heme/Allergies: Negative for environmental allergies. Does not bruise/bleed easily.  Psychiatric/Behavioral: Negative for depression. The patient is not nervous/anxious and does not have insomnia.   All other systems reviewed and are negative.    Objective:  Physical Exam Vitals reviewed.  Constitutional:      General: She is not in acute distress.    Appearance: She is well-developed.  HENT:     Head: Normocephalic and atraumatic.  Eyes:     General: No scleral icterus.    Conjunctiva/sclera: Conjunctivae normal.     Pupils: Pupils are equal, round, and reactive to light.  Neck:     Vascular: No JVD.     Trachea: No tracheal deviation.  Cardiovascular:     Rate and Rhythm: Normal rate and regular rhythm.     Heart sounds: Normal heart sounds. No murmur heard.   Pulmonary:     Effort: Pulmonary effort is normal. No  tachypnea, accessory muscle usage or respiratory distress.     Breath sounds: Normal breath sounds. No stridor. No wheezing, rhonchi or rales.  Abdominal:     General: Bowel sounds are normal. There is no distension.     Palpations: Abdomen is soft.     Tenderness: There is no abdominal tenderness.  Musculoskeletal:        General: No tenderness.     Cervical back: Neck supple.  Lymphadenopathy:     Cervical: No cervical adenopathy.  Skin:    General: Skin is warm and dry.     Capillary Refill: Capillary refill takes less than 2 seconds.     Findings: No rash.  Neurological:     Mental Status: She is alert and oriented to person, place, and time.  Psychiatric:        Behavior: Behavior normal.      Vitals:   06/26/20 1051  BP: 134/80  Pulse: 80  Temp: 97.7 F (36.5 C)  TempSrc: Tympanic  SpO2: 97%  Weight: 118 lb (53.5 kg)   97% on RA BMI Readings from Last 3 Encounters:  06/26/20 20.90 kg/m  06/11/20 21.51 kg/m  06/02/20 21.97 kg/m   Wt Readings from Last 3 Encounters:  06/26/20 118 lb (53.5 kg)  06/11/20 121 lb 6.4 oz (55.1 kg)  06/02/20 120 lb 2 oz (54.5 kg)     CBC    Component Value Date/Time   WBC 5.4 06/02/2020 1215   RBC 3.60 (L) 06/02/2020 1215   HGB 10.6 (L) 06/02/2020 1215   HGB 13.0 11/11/2011 1013   HGB 14.4 10/05/2007 1009   HCT 32.8 (L) 06/02/2020 1215   HCT 39.3 11/11/2011 1013   HCT 41.5 10/05/2007 1009   PLT 348 06/02/2020 1215   PLT 280 11/11/2011 1013   PLT 270 10/05/2007 1009   MCV 91.1 06/02/2020 1215   MCV 87 11/11/2011 1013   MCV 92.2 10/05/2007 1009   MCH 29.4 06/02/2020 1215   MCHC 32.3 06/02/2020 1215   RDW 13.3 06/02/2020 1215   RDW 14.1 11/11/2011 1013   RDW 13.3 10/05/2007 1009   LYMPHSABS 1,312 06/02/2020 1215   LYMPHSABS 1.2  11/11/2011 1013   LYMPHSABS 1.5 10/05/2007 1009   MONOABS 0.8 04/29/2020 1204   MONOABS 0.6 10/05/2007 1009   EOSABS 81 06/02/2020 1215   EOSABS 0.1 11/11/2011 1013   BASOSABS 22  06/02/2020 1215   BASOSABS 0.0 11/11/2011 1013   BASOSABS 0.0 10/05/2007 1009     Chest Imaging: 06/02/2020 CT chest: Persistent 0.9 x 2 cm spiculated nodule of the left upper lobe.  Some peripheral interstitial changes concerning for fibrosis. The patient's images have been independently reviewed by me.   November 2021 nuclear medicine pet image: Spiculated left upper lobe lesion concerning for primary bronchogenic carcinoma with PET avid uptake.  Pulmonary Functions Testing Results: No flowsheet data found.  FeNO:   Pathology:   Echocardiogram:   1. Septal motion is abnormal likely due to conduction abnormality. . Left  ventricular ejection fraction, by estimation, is 60 to 65%. The left  ventricle has normal function. The left ventricle has no regional wall  motion abnormalities. There is mild  concentric left ventricular hypertrophy. Left ventricular diastolic  parameters are consistent with Grade I diastolic dysfunction (impaired  relaxation).  2. Right ventricular systolic function is normal. The right ventricular  size is normal. There is mildly elevated pulmonary artery systolic  pressure.  3. The mitral valve is normal in structure. Trivial mitral valve  regurgitation.  4. The aortic valve is tricuspid. There is mild calcification of the  aortic valve. There is mild thickening of the aortic valve. Aortic valve  regurgitation is trivial. Mild aortic valve sclerosis is present, with no  evidence of aortic valve stenosis.  5. The inferior vena cava is normal in size with greater than 50%  respiratory variability, suggesting right atrial pressure of 3 mmHg.   Heart Catheterization:     Assessment & Plan:     ICD-10-CM   1. Left upper lobe pulmonary nodule  R91.1 Ambulatory referral to Radiation Oncology  2. Abnormal CT scan of lung  R91.8 Ambulatory referral to Radiation Oncology  3. Pulmonary embolism, bilateral ([place])  I26.99 Ambulatory referral to Radiation  Oncology  4. On continuous oral anticoagulation  Z79.01 Ambulatory referral to Radiation Oncology    Discussion: 86 year old female recently hospitalized for bilateral PE and intussusception secondary to a colon lipoma with colectomy and primary anastomosis.  Patient did well following her surgery and still at this time on anticoagulation.  Had follow-up imaging regarding a left upper lobe pulmonary nodule.  This was evaluated with a PET scan which revealed hypermetabolic uptake concerning for a primary malignancy.  She also has a small focus of uptake within the AP window.  Pet imaging reviewed today in the office as described above.  Plan: We discussed all of the different options today to include biopsy versus consideration for empiric radiation. I think with the patient's medical history, age and to her overall goals of care I think we should consider empiric radiation treatment. Patient is agreeable to this plan if they would consider this from radiation oncology standpoint. I do believe that if she needed to have a biopsy that she could possibly tolerate this but her son had a biopsy with a pneumothorax for a lung cancer she is a little anxious about having a procedure done. She is also anxious about having to undergo another procedure at her current age. Ambulatory referral to radiation oncology was placed.    Current Outpatient Medications:  .  apixaban (ELIQUIS) 5 MG TABS tablet, Take 1 tablet (5 mg total) by mouth 2 (  two) times daily., Disp: 60 tablet, Rfl: 0 .  bisacodyl (DULCOLAX) 10 MG suppository, Place 1 suppository (10 mg total) rectally as needed for moderate constipation., Disp: 12 suppository, Rfl: 0 .  feeding supplement, ENSURE ENLIVE, (ENSURE ENLIVE) LIQD, Take 237 mLs by mouth 2 (two) times daily between meals., Disp: 237 mL, Rfl: 12 .  fluticasone (FLONASE) 50 MCG/ACT nasal spray, Place 2 sprays into both nostrils daily as needed for allergies or rhinitis., Disp: 48 g,  Rfl: 0 .  levothyroxine (SYNTHROID) 75 MCG tablet, Take 1 tablet (75 mcg total) by mouth daily before breakfast., Disp: 90 tablet, Rfl: 0 .  Multiple Vitamins-Minerals (ICAPS AREDS 2 PO), Take 1 capsule by mouth 2 (two) times daily., Disp: , Rfl:  .  polyethylene glycol (MIRALAX / GLYCOLAX) 17 g packet, Take 17 g by mouth daily as needed for moderate constipation., Disp: 14 each, Rfl: 0 .  potassium chloride (KLOR-CON) 10 MEQ tablet, Take 1 tablet (10 mEq total) by mouth daily., Disp: 90 tablet, Rfl: 1 .  psyllium (HYDROCIL/METAMUCIL) 95 % PACK, Take 1 packet by mouth daily. Hold for diarrhoea, Disp: 240 each, Rfl:  .  rosuvastatin (CRESTOR) 5 MG tablet, Take 1 tablet (5 mg total) by mouth 3 (three) times a week. No specific days, Disp: 12 tablet, Rfl: 0   Garner Nash, DO [facility] 06/26/2020 10:55 AM

## 2020-06-26 NOTE — Patient Instructions (Addendum)
Thank you for visiting Dr. Valeta Harms at [facility]. Today we recommend the following:  Orders Placed This Encounter  Procedures   Ambulatory referral to Radiation Oncology   Return in about 3 months (around 09/26/2020) for Dr. Valeta Harms.    Please do your part to reduce the spread of COVID-19.

## 2020-06-27 DIAGNOSIS — Z48815 Encounter for surgical aftercare following surgery on the digestive system: Secondary | ICD-10-CM | POA: Diagnosis not present

## 2020-06-27 DIAGNOSIS — I82441 Acute embolism and thrombosis of right tibial vein: Secondary | ICD-10-CM | POA: Diagnosis not present

## 2020-06-27 DIAGNOSIS — I2690 Septic pulmonary embolism without acute cor pulmonale: Secondary | ICD-10-CM | POA: Diagnosis not present

## 2020-06-27 DIAGNOSIS — I82451 Acute embolism and thrombosis of right peroneal vein: Secondary | ICD-10-CM | POA: Diagnosis not present

## 2020-06-27 DIAGNOSIS — I82431 Acute embolism and thrombosis of right popliteal vein: Secondary | ICD-10-CM | POA: Diagnosis not present

## 2020-06-27 DIAGNOSIS — I129 Hypertensive chronic kidney disease with stage 1 through stage 4 chronic kidney disease, or unspecified chronic kidney disease: Secondary | ICD-10-CM | POA: Diagnosis not present

## 2020-06-27 DIAGNOSIS — M800AXS Age-related osteoporosis with current pathological fracture, other site, sequela: Secondary | ICD-10-CM | POA: Diagnosis not present

## 2020-06-27 DIAGNOSIS — I82452 Acute embolism and thrombosis of left peroneal vein: Secondary | ICD-10-CM | POA: Diagnosis not present

## 2020-06-27 DIAGNOSIS — D649 Anemia, unspecified: Secondary | ICD-10-CM | POA: Diagnosis not present

## 2020-06-28 ENCOUNTER — Other Ambulatory Visit: Payer: Self-pay | Admitting: Family

## 2020-06-28 DIAGNOSIS — I2699 Other pulmonary embolism without acute cor pulmonale: Secondary | ICD-10-CM

## 2020-06-30 DIAGNOSIS — I82452 Acute embolism and thrombosis of left peroneal vein: Secondary | ICD-10-CM | POA: Diagnosis not present

## 2020-06-30 DIAGNOSIS — I129 Hypertensive chronic kidney disease with stage 1 through stage 4 chronic kidney disease, or unspecified chronic kidney disease: Secondary | ICD-10-CM | POA: Diagnosis not present

## 2020-06-30 DIAGNOSIS — I2690 Septic pulmonary embolism without acute cor pulmonale: Secondary | ICD-10-CM | POA: Diagnosis not present

## 2020-06-30 DIAGNOSIS — Z48815 Encounter for surgical aftercare following surgery on the digestive system: Secondary | ICD-10-CM | POA: Diagnosis not present

## 2020-06-30 DIAGNOSIS — I82431 Acute embolism and thrombosis of right popliteal vein: Secondary | ICD-10-CM | POA: Diagnosis not present

## 2020-06-30 DIAGNOSIS — I82441 Acute embolism and thrombosis of right tibial vein: Secondary | ICD-10-CM | POA: Diagnosis not present

## 2020-06-30 DIAGNOSIS — M800AXS Age-related osteoporosis with current pathological fracture, other site, sequela: Secondary | ICD-10-CM | POA: Diagnosis not present

## 2020-06-30 DIAGNOSIS — I82451 Acute embolism and thrombosis of right peroneal vein: Secondary | ICD-10-CM | POA: Diagnosis not present

## 2020-06-30 DIAGNOSIS — D649 Anemia, unspecified: Secondary | ICD-10-CM | POA: Diagnosis not present

## 2020-07-02 ENCOUNTER — Telehealth: Payer: Self-pay

## 2020-07-02 DIAGNOSIS — I82431 Acute embolism and thrombosis of right popliteal vein: Secondary | ICD-10-CM | POA: Diagnosis not present

## 2020-07-02 DIAGNOSIS — M800AXS Age-related osteoporosis with current pathological fracture, other site, sequela: Secondary | ICD-10-CM | POA: Diagnosis not present

## 2020-07-02 DIAGNOSIS — Z48815 Encounter for surgical aftercare following surgery on the digestive system: Secondary | ICD-10-CM | POA: Diagnosis not present

## 2020-07-02 DIAGNOSIS — I2690 Septic pulmonary embolism without acute cor pulmonale: Secondary | ICD-10-CM | POA: Diagnosis not present

## 2020-07-02 DIAGNOSIS — D649 Anemia, unspecified: Secondary | ICD-10-CM | POA: Diagnosis not present

## 2020-07-02 DIAGNOSIS — I129 Hypertensive chronic kidney disease with stage 1 through stage 4 chronic kidney disease, or unspecified chronic kidney disease: Secondary | ICD-10-CM | POA: Diagnosis not present

## 2020-07-02 DIAGNOSIS — I82452 Acute embolism and thrombosis of left peroneal vein: Secondary | ICD-10-CM | POA: Diagnosis not present

## 2020-07-02 DIAGNOSIS — I2699 Other pulmonary embolism without acute cor pulmonale: Secondary | ICD-10-CM

## 2020-07-02 DIAGNOSIS — I82441 Acute embolism and thrombosis of right tibial vein: Secondary | ICD-10-CM | POA: Diagnosis not present

## 2020-07-02 DIAGNOSIS — I82451 Acute embolism and thrombosis of right peroneal vein: Secondary | ICD-10-CM | POA: Diagnosis not present

## 2020-07-02 MED ORDER — APIXABAN 5 MG PO TABS: 5.0000 mg | ORAL_TABLET | Freq: Two times a day (BID) | ORAL | 4 refills | Status: DC

## 2020-07-02 NOTE — Telephone Encounter (Signed)
Rx sent 

## 2020-07-02 NOTE — Telephone Encounter (Signed)
Bilateral pulmonary emboli, DX 04-2020.  Will need at least 6 months of anticoagulation.  Okay to refill x4 months

## 2020-07-02 NOTE — Telephone Encounter (Signed)
Refill request for Ariel Medina 5mg . Does she need to continue this medicine?

## 2020-07-04 ENCOUNTER — Other Ambulatory Visit: Payer: Self-pay | Admitting: Family

## 2020-07-04 DIAGNOSIS — I2699 Other pulmonary embolism without acute cor pulmonale: Secondary | ICD-10-CM

## 2020-07-08 DIAGNOSIS — I82431 Acute embolism and thrombosis of right popliteal vein: Secondary | ICD-10-CM | POA: Diagnosis not present

## 2020-07-08 DIAGNOSIS — D649 Anemia, unspecified: Secondary | ICD-10-CM | POA: Diagnosis not present

## 2020-07-08 DIAGNOSIS — I82441 Acute embolism and thrombosis of right tibial vein: Secondary | ICD-10-CM | POA: Diagnosis not present

## 2020-07-08 DIAGNOSIS — Z48815 Encounter for surgical aftercare following surgery on the digestive system: Secondary | ICD-10-CM | POA: Diagnosis not present

## 2020-07-08 DIAGNOSIS — I82451 Acute embolism and thrombosis of right peroneal vein: Secondary | ICD-10-CM | POA: Diagnosis not present

## 2020-07-08 DIAGNOSIS — I129 Hypertensive chronic kidney disease with stage 1 through stage 4 chronic kidney disease, or unspecified chronic kidney disease: Secondary | ICD-10-CM | POA: Diagnosis not present

## 2020-07-08 DIAGNOSIS — M800AXS Age-related osteoporosis with current pathological fracture, other site, sequela: Secondary | ICD-10-CM | POA: Diagnosis not present

## 2020-07-08 DIAGNOSIS — I2690 Septic pulmonary embolism without acute cor pulmonale: Secondary | ICD-10-CM | POA: Diagnosis not present

## 2020-07-08 DIAGNOSIS — I82452 Acute embolism and thrombosis of left peroneal vein: Secondary | ICD-10-CM | POA: Diagnosis not present

## 2020-07-09 ENCOUNTER — Encounter: Payer: Self-pay | Admitting: Radiation Oncology

## 2020-07-09 ENCOUNTER — Ambulatory Visit
Admission: RE | Admit: 2020-07-09 | Discharge: 2020-07-09 | Disposition: A | Discharge status: Home / Self Care | Payer: Medicare HMO | Source: Ambulatory Visit | Attending: Radiation Oncology | Admitting: Radiation Oncology

## 2020-07-09 ENCOUNTER — Other Ambulatory Visit: Payer: Self-pay

## 2020-07-09 VITALS — BP 145/94 | HR 79 | Temp 97.8°F | Resp 24

## 2020-07-09 VITALS — BP 145/94 | HR 79 | Temp 97.8°F | Resp 24 | Ht 63.0 in | Wt 117.6 lb

## 2020-07-09 DIAGNOSIS — I700 Atherosclerosis of aorta: Secondary | ICD-10-CM | POA: Insufficient documentation

## 2020-07-09 DIAGNOSIS — M8580 Other specified disorders of bone density and structure, unspecified site: Secondary | ICD-10-CM | POA: Diagnosis not present

## 2020-07-09 DIAGNOSIS — R197 Diarrhea, unspecified: Secondary | ICD-10-CM | POA: Diagnosis not present

## 2020-07-09 DIAGNOSIS — Z923 Personal history of irradiation: Secondary | ICD-10-CM | POA: Insufficient documentation

## 2020-07-09 DIAGNOSIS — R918 Other nonspecific abnormal finding of lung field: Secondary | ICD-10-CM | POA: Insufficient documentation

## 2020-07-09 DIAGNOSIS — Z853 Personal history of malignant neoplasm of breast: Secondary | ICD-10-CM | POA: Diagnosis not present

## 2020-07-09 DIAGNOSIS — E039 Hypothyroidism, unspecified: Secondary | ICD-10-CM | POA: Diagnosis not present

## 2020-07-09 DIAGNOSIS — M1990 Unspecified osteoarthritis, unspecified site: Secondary | ICD-10-CM | POA: Insufficient documentation

## 2020-07-09 DIAGNOSIS — R911 Solitary pulmonary nodule: Secondary | ICD-10-CM | POA: Diagnosis not present

## 2020-07-09 DIAGNOSIS — J439 Emphysema, unspecified: Secondary | ICD-10-CM | POA: Diagnosis not present

## 2020-07-09 DIAGNOSIS — J90 Pleural effusion, not elsewhere classified: Secondary | ICD-10-CM | POA: Diagnosis not present

## 2020-07-09 DIAGNOSIS — K449 Diaphragmatic hernia without obstruction or gangrene: Secondary | ICD-10-CM | POA: Diagnosis not present

## 2020-07-09 DIAGNOSIS — D649 Anemia, unspecified: Secondary | ICD-10-CM | POA: Insufficient documentation

## 2020-07-09 DIAGNOSIS — Z79899 Other long term (current) drug therapy: Secondary | ICD-10-CM | POA: Diagnosis not present

## 2020-07-09 DIAGNOSIS — C3412 Malignant neoplasm of upper lobe, left bronchus or lung: Secondary | ICD-10-CM

## 2020-07-09 DIAGNOSIS — E785 Hyperlipidemia, unspecified: Secondary | ICD-10-CM | POA: Insufficient documentation

## 2020-07-09 DIAGNOSIS — I10 Essential (primary) hypertension: Secondary | ICD-10-CM | POA: Diagnosis not present

## 2020-07-09 DIAGNOSIS — Z7901 Long term (current) use of anticoagulants: Secondary | ICD-10-CM | POA: Diagnosis not present

## 2020-07-09 NOTE — Progress Notes (Signed)
Radiation Oncology         [phone]) [phone] ________________________________  Initial Outpatient Consultation  Name: Ariel Medina MRN: [id number]  Date: 07/09/2020  DOB: 09/01/1927  CC:Paz, Ariel Berthold, MD  Ariel Nash, DO   REFERRING PHYSICIAN: Garner Nash, DO  DIAGNOSIS: The primary encounter diagnosis was Primary malignant neoplasm of left upper lobe of lung ([place]). A diagnosis of Solitary pulmonary nodule was also pertinent to this visit.  PET avid left upper lobe pulmonary nodule  HISTORY OF PRESENT ILLNESS::Ariel Medina is a 86 y.o. female who is seen as a courtesy of Dr. Valeta Medina for an opinion concerning radiation therapy as part of management for her recently diagnosed lung cancer. Today, she is accompanied by her granddaughter. The patient presented to the ED on 04/29/2020 with complaints of diarrhea and shortness of breath. CTA of chest / CT of abdomen and pelvis performed at that time showed acute bilateral pulmonary emboli. Additionally, there was an irregular density noted peripherally in the left upper lobe with a 16 x 11 mm irregular solid density. There was also a large intussusception involving the terminal ileum, right and transverse colon with a possible 19 mm lipoma at the lead point. The patient was admitted to the hospital and underwent an open ended right colectomy with stabled anastomosis on 05/01/2020 (Dr. Ninfa Medina and Dr. Deborra Medina). Pathology from the procedure revealed submucosal lipoma at lead point of intussusception with an unremarkable appendix and four unremarkable lymph nodes. The patient was stabilized and discharged to [facility] on 05/06/2020.   The patient was seen in the ED again on 05/08/2020 for evaluation of GI bleeding. CT scan of abdomen and pelvis at that time showed patent anastomosis in the area of her recent surgery. There was some slight soft tissue stranding that was felt to be due to her recent surgery. There was no evidence to suggest perforation  or abscess, nor was there any fluid seen surrounding the area. However, there were noted to be pleural effusions bilaterally and compressive atelectasis in the lung bases with suspected focus of pneumonia in the posterior left bases. Additionally, there was a 1.7 x 1.7 x 1.7 cm partially calcified mass arising eccentrically from the pancreatic head. Significance was uncertain given the patient's age. Particular attention on subsequent evaluations was warranted. Finally, there was a sizable hiatal hernia with a small lipoma noted within the rightward aspect. There was no bowel obstruction or abscess in the abdomen or pelvis. The patient was once again admitted to the hospital and then discharged to [facility] on 05/12/2020.  Repeat chest CT scan on 06/02/2020 showed a persistent appearance of the spiculated airspace opacity in the left lung apex that extended to the left lung pleural surface and measured 0.9 x 2.0 cm, not significantly changed from comparison. There were some adjacent subpleural reticular changes noted throughout the anterior segment of the left upper lobe and more diffusely within the right upper lobe. Findings were favored to reflect post infectious/inflammatory scarring. Additionally, there were more diffuse subpleural reticular changes that could reflect developing interstitial fibrosis, new trace left pleural effusion that was nonspecific, three-vessel coronary artery atherosclerosis, re-demonstration of the hiatal hernia with slight organo-axial rotation, stable central pulmonary arterial enlargement possible reflecting chronic pulmonary artery hypertension, hypoattenuation of the cardiac blood pool suggesting mild anemia, and chronic bronchitic features and emphysema.   Subsequently, the patient was seen by Dr. Valeta Medina who recommended obtaining a PET scan. PET scan was performed on 06/24/2020 and showed a  hypermetabolic left upper lobe anterior pulmonary nodule that was concerning for  primary neoplasm versus metastatic disease. There was also noted to be a small AP window lymph node that showed low-level FDG uptake; metastatic disease could not be excluded. Finally, there was asymmetric, focal, mild hypermetabolism of the posterior right nasopharynx. Direct visualization was recommended to exclude mucosal lesion.  The patient was last seen by Dr. Valeta Medina on 06/26/2020, during which time they discussed biopsy versus consideration for empiric radiation. Given the patient's medical history, age, and overall goals of care, empiric radiation was felt to be a better option.   PREVIOUS RADIATION THERAPY: The patient has a history of invasive ductal carcinoma of the left breast.  She reports having 33 radiation treatments directed at the left breast.  Details concerning this treatment are pending at this time.  Patient reports having her radiation therapy in 2003.  She was placed on Arimidex after her radiation therapy  PAST MEDICAL HISTORY:  Past Medical History:  Diagnosis Date  . Anemia   . Breast CA ([place])    left breast-invasive ductal ca stage 1-stopped arimidex 2008  . Diverticulitis of colon   . GI bleed 04/2020  . Hiatal hernia   . Hyperlipidemia   . Hypertension   . Hypothyroidism   . Osteoarthritis   . Osteopenia    DEXA 7/02  . Syncope 2005   CT head (-), ECHO essent. neg, stress test (-), carotid u/s (-)    PAST SURGICAL HISTORY: Past Surgical History:  Procedure Laterality Date  . BREAST BIOPSY Bilateral   . BREAST LUMPECTOMY  04/2002   left  . PARTIAL COLECTOMY N/A 05/01/2020   Procedure: PARTIAL COLECTOMY;  Surgeon: Coralie Keens, MD;  Location: [place];  Service: General;  Laterality: N/A;  . TONSILLECTOMY AND ADENOIDECTOMY      FAMILY HISTORY:  Family History  Problem Relation Age of Onset  . Heart attack Brother 69  . Diabetes Mother   . Stroke Father 39  . Stroke Sister 77  . Colon cancer Neg Hx   . Breast cancer Neg Hx     SOCIAL HISTORY:   Social History   Tobacco Use  . Smoking status: Never Smoker  . Smokeless tobacco: Never Used  Vaping Use  . Vaping Use: Never used  Substance Use Topics  . Alcohol use: No  . Drug use: No    ALLERGIES:  Allergies  Allergen Reactions  . Ibuprofen     REACTION: bleed    MEDICATIONS:  Current Outpatient Medications  Medication Sig Dispense Refill  . apixaban (ELIQUIS) 5 MG TABS tablet Take 1 tablet (5 mg total) by mouth 2 (two) times daily. 60 tablet 4  . feeding supplement, ENSURE ENLIVE, (ENSURE ENLIVE) LIQD Take 237 mLs by mouth 2 (two) times daily between meals. 237 mL 12  . hydrochlorothiazide (HYDRODIURIL) 25 MG tablet     . levothyroxine (SYNTHROID) 75 MCG tablet Take 1 tablet (75 mcg total) by mouth daily before breakfast. 90 tablet 0  . potassium chloride (KLOR-CON) 10 MEQ tablet Take 1 tablet (10 mEq total) by mouth daily. 90 tablet 1  . rosuvastatin (CRESTOR) 5 MG tablet Take 1 tablet (5 mg total) by mouth 3 (three) times a week. No specific days 12 tablet 0  . bisacodyl (DULCOLAX) 10 MG suppository Place 1 suppository (10 mg total) rectally as needed for moderate constipation. (Patient not taking: Reported on 07/09/2020) 12 suppository 0  . fluticasone (FLONASE) 50 MCG/ACT nasal spray Place 2  sprays into both nostrils daily as needed for allergies or rhinitis. (Patient not taking: Reported on 07/09/2020) 48 g 0  . Multiple Vitamins-Minerals (ICAPS AREDS 2 PO) Take 1 capsule by mouth 2 (two) times daily. (Patient not taking: Reported on 07/09/2020)    . polyethylene glycol (MIRALAX / GLYCOLAX) 17 g packet Take 17 g by mouth daily as needed for moderate constipation. (Patient not taking: Reported on 07/09/2020) 14 each 0  . psyllium (HYDROCIL/METAMUCIL) 95 % PACK Take 1 packet by mouth daily. Hold for diarrhoea (Patient not taking: Reported on 07/09/2020) 240 each    No current facility-administered medications for this encounter.    REVIEW OF SYSTEMS:  A 10+ POINT REVIEW OF  SYSTEMS WAS OBTAINED including neurology, dermatology, psychiatry, cardiac, respiratory, lymph, extremities, GI, GU, musculoskeletal, constitutional, reproductive, HEENT.  He denies any significant cough or breathing problems.  She denies any hemoptysis.  She reports no problems after having her left breast radiation therapy.   PHYSICAL EXAM:  temperature is 97.8 F (36.6 C). Her blood pressure is 145/94 (abnormal) and her pulse is 79. Her respiration is 24 (abnormal) and oxygen saturation is 100%.   General: Alert and oriented, in no acute distress, resting tremor noted HEENT: Head is normocephalic. Extraocular movements are intact.  Neck: Neck is supple, no palpable cervical or supraclavicular lymphadenopathy. Heart: Regular in rate and rhythm with no murmurs, rubs, or gallops. Chest: Clear to auscultation bilaterally, with no rhonchi, wheezes, or rales. tatoo present in the sternal region from her prior radiation therapy Abdomen: Soft, nontender, nondistended, with no rigidity or guarding. Extremities: No cyanosis or edema. Lymphatics: see Neck Exam Skin: No concerning lesions. Musculoskeletal: symmetric strength and muscle tone throughout.  Good range of movement in her shoulders Neurologic: Cranial nerves II through XII are grossly intact. No obvious focalities. Speech is fluent. Coordination is intact. Psychiatric: Judgment and insight are intact. Affect is appropriate. Right breast: no palpable mass nipple discharge or bleeding Left breast: No palpable mass nipple discharge or bleeding  ECOG = 1  0 - Asymptomatic (Fully active, able to carry on all predisease activities without restriction)  1 - Symptomatic but completely ambulatory (Restricted in physically strenuous activity but ambulatory and able to carry out work of a light or sedentary nature. For example, light housework, office work)  2 - Symptomatic, <50% in bed during the day (Ambulatory and capable of all self care but  unable to carry out any work activities. Up and about more than 50% of waking hours)  3 - Symptomatic, >50% in bed, but not bedbound (Capable of only limited self-care, confined to bed or chair 50% or more of waking hours)  4 - Bedbound (Completely disabled. Cannot carry on any self-care. Totally confined to bed or chair)  5 - Death   Eustace Pen MM, Creech RH, Tormey DC, et al. [phone]). "Toxicity and response criteria of the [facility]". [place] Oncol. 5 (6): 649-55  LABORATORY DATA:  Lab Results  Component Value Date   WBC 5.4 06/02/2020   HGB 10.6 (L) 06/02/2020   HCT 32.8 (L) 06/02/2020   MCV 91.1 06/02/2020   PLT 348 06/02/2020   NEUTROABS 3,505 06/02/2020   Lab Results  Component Value Date   NA 139 06/02/2020   K 4.1 06/02/2020   CL 98 06/02/2020   CO2 33 (H) 06/02/2020   GLUCOSE 92 06/02/2020   CREATININE 0.67 06/02/2020   CALCIUM 10.0 06/02/2020      RADIOGRAPHY: NM PET  Image Initial (PI) Skull Base To Thigh  Result Date: 06/25/2020 CLINICAL DATA:  Initial treatment strategy for pulmonary nodule. EXAM: NUCLEAR MEDICINE PET SKULL BASE TO THIGH TECHNIQUE: 6.1 mCi F-18 FDG was injected intravenously. Full-ring PET imaging was performed from the skull base to thigh after the radiotracer. CT data was obtained and used for attenuation correction and anatomic localization. Fasting blood glucose: 93 mg/dl COMPARISON:  Chest CT 06/02/2020. FINDINGS: Mediastinal blood pool activity: SUV max 2.2 Liver activity: SUV max NA NECK: Asymmetric focal hypermetabolism identified right posterior nasopharynx with SUV max = 4.6. This is in the region of the fossa of [place]. Incidental CT findings: none CHEST: 2 cm spiculated nodule identified in the left lung apex on previous imaging is hypermetabolic with SUV max = 4.6. Presumed radiation fibrosis anterior left upper lobe shows low level FDG uptake with SUV max = 2.9. 4 mm short axis AP window lymph node on 55/4 shows  low level FDG accumulation with SUV max = 3.0. Incidental CT findings: Centrilobular emphsyema noted. Small left pleural effusion. There is abdominal aortic atherosclerosis without aneurysm. Large hiatal hernia noted. No worrisome lytic or sclerotic osseous abnormality. ABDOMEN/PELVIS: No abnormal hypermetabolic activity within the liver, pancreas, adrenal glands, or spleen. No hypermetabolic lymph nodes in the abdomen or pelvis. Scattered areas of uptake in the colon are probably physiologic. Incidental CT findings: Tortuous aorta measuring up to 2.8 cm diameter. Right hemicolectomy with diverticular disease noted left colon. Bilateral renal cysts. Small rim calcified lesion anterior to the duodenum shows no hypermetabolism. SKELETON: No focal hypermetabolic activity to suggest skeletal metastasis. Degenerative uptake noted right shoulder and cervical spine. Incidental CT findings: Advanced degenerative changes noted in the hips bilaterally without substantial hypermetabolism. IMPRESSION: 1. Left upper lobe anterior pulmonary nodule of concern is hypermetabolic. Primary neoplasm versus metastatic disease. 2. Small AP window lymph node shows low level FDG uptake. Metastatic disease not excluded. 3. Asymmetric, focal mild hypermetabolism posterior right nasopharynx. Direct visualization recommended to exclude mucosal lesion. 4. Emphysema. ([id number]) Aortic Atherosclerois (ICD10-170.0) Electronically Signed   By: Misty Stanley M.D.   On: 06/25/2020 08:56      IMPRESSION: PET avid left upper lobe pulmonary nodule  I discussed with the patient and her granddaughter that clinical indications are that she has a early stage slow-growing non-small cell lung cancer.  As above we do not have pathologic confirmation of this but given the constellation of findings on imaging studies this would appear to be the case.  Given the patient's advanced age and medical issues she is not a ideal candidate for biopsy or surgery.  She would be a good candidate for stereotactic body radiation therapy directed at the solitary lesion in the left upper lobe.  This appears to be superior to anticipated previous left radiation treatments as well as deeper in the chest so doubt there would be any significant overlap with her previous left breast radiation therapy and given the time interval this should not be an issue for her.  Today, I talked to the patient and and granddaughter about the findings and work-up thus far. We discussed the natural history of lung cancer and general treatment, highlighting the role of radiotherapy in the management.  We discussed the available radiation techniques, and focused on the details of logistics and delivery.  We reviewed the anticipated acute and late sequelae associated with radiation in this setting.  The patient was encouraged to ask questions that I answered to the best of my ability.  A  patient consent form was discussed and signed.  We retained a copy for our records.  The patient would like to proceed with radiation and will be scheduled for CT simulation.  PLAN: Patient will return for SBRT simulation in the near future.  Anticipate radiation therapy starting approximately a week after her planning session.  Anticipate that she will receive 3 SBRT treatments directed at the solitary nodule in the left upper lobe.  Total time spent in this encounter was 60 minutes which included reviewing the patient's most recent ED visits, hospitalizations, CT scans, consultations, follow-ups, surgery, pathology report, PET scan, physical examination, and documentation.  ------------------------------------------------  Blair Promise, PhD, MD  This document serves as a record of services personally performed by Gery Pray, MD. It was created on his behalf by Clerance Lav, a trained medical scribe. The creation of this record is based on the scribe's personal observations and the provider's statements  to them. This document has been checked and approved by the attending provider.

## 2020-07-09 NOTE — Progress Notes (Signed)
Thoracic Location of Tumor / Histology:  LEFT upper lobe pulmonary nodule   Patient presented with symptoms of:  Patient had a recent hospitalization for intussusception.  Patient found to have a colon lipoma causing intussusception.  She was taken to the operating room for partial colectomy and anastomosis. She had bilateral pulmonary emboli pulmonary was consulted. Patient was trialed on anticoagulation during the hospitalization. Patient was discharged from the hospital on 05/06/2020. In addition she did have bilateral lower extremity DVT.  CT scan of the chest on admission revealed a 16 x 11 mm irregular solid density within the left upper lobe.  Ultimately the patient had hospital follow-up in our clinic at discharge with Ariel Medina, Ariel Medina.  Recommended continued Eliquis for DVT/PE.  Planned noncontrasted CT chest follow-up to ensure resolution of lung nodule.  She had this repeat noncontrasted CT of the chest on 06/02/2020.  There was persistence of this opacity in the left lung apex measuring 0.9 x 2 cm in size.  Biopsies revealed:  None collected--per Dr. Valeta Harms will determine at a later date if necessary  Tobacco/Marijuana/Snuff/ETOH use:No  Past/Anticipated interventions by cardiothoracic surgery, if any:  06/26/2020 [name] Patient here today for follow-up regarding recent PET scan imaging of abnormal lung nodule.  This was completed and read yesterday.  Looking at the left upper lobe anterior pulmonary nodule is hypermetabolic with a SUV of 4.6.  Additionally there is a small AP window node that metabolic activity is present does not exclude metastatic disease.  Today we reviewed patient's patient's imaging as above.  Otherwise respiratory standpoint she has no complaints. Plan: --We discussed all of the different options today to include biopsy versus consideration for empiric radiation. --I think with the patient's medical history, age and to her overall goals of care I think we  should consider empiric radiation treatment. --Patient is agreeable to this plan if they would consider this from radiation oncology standpoint. --I do believe that if she needed to have a biopsy that she could possibly tolerate this but her son had a biopsy with a pneumothorax for a lung cancer she is a little anxious about having a procedure done. --She is also anxious about having to undergo another procedure at her current age. --Ambulatory referral to radiation oncology was placed.  Past/Anticipated interventions by medical oncology, if any: No referral has been placed at this time  Signs/Symptoms  Weight changes, if any Patient states that she has lost 1 to 2 pounds.  Respiratory complaints, if any: Patient states that she gets sob with activity  Hemoptysis, if any: [place]  Pain issues, if any:  Patient states that she is having pain in her back.  SAFETY ISSUES:  Prior radiation? Yes ,2003 For her breast   Pacemaker/ No  Possible current pregnancy? No--postmenopausal Is the patient on methotrexate? No Current Complaints / other details:   History of left breast cancer-invasive ductal stage 1-stopped arimidex 2008 Vitals:   07/09/20 0849  BP: (!) 145/94  Pulse: 79  Resp: (!) 24  Temp: 97.8 F (36.6 C)  SpO2: 100%   Patients blood pressure is elevated this morning states that she has not taken her blood pressure medication this  Morning. Denies any s/s of hyperension

## 2020-07-10 DIAGNOSIS — I82431 Acute embolism and thrombosis of right popliteal vein: Secondary | ICD-10-CM | POA: Diagnosis not present

## 2020-07-10 DIAGNOSIS — I82441 Acute embolism and thrombosis of right tibial vein: Secondary | ICD-10-CM | POA: Diagnosis not present

## 2020-07-10 DIAGNOSIS — I82452 Acute embolism and thrombosis of left peroneal vein: Secondary | ICD-10-CM | POA: Diagnosis not present

## 2020-07-10 DIAGNOSIS — Z48815 Encounter for surgical aftercare following surgery on the digestive system: Secondary | ICD-10-CM | POA: Diagnosis not present

## 2020-07-10 DIAGNOSIS — I129 Hypertensive chronic kidney disease with stage 1 through stage 4 chronic kidney disease, or unspecified chronic kidney disease: Secondary | ICD-10-CM | POA: Diagnosis not present

## 2020-07-10 DIAGNOSIS — I82451 Acute embolism and thrombosis of right peroneal vein: Secondary | ICD-10-CM | POA: Diagnosis not present

## 2020-07-10 DIAGNOSIS — D649 Anemia, unspecified: Secondary | ICD-10-CM | POA: Diagnosis not present

## 2020-07-10 DIAGNOSIS — M800AXS Age-related osteoporosis with current pathological fracture, other site, sequela: Secondary | ICD-10-CM | POA: Diagnosis not present

## 2020-07-10 DIAGNOSIS — I2690 Septic pulmonary embolism without acute cor pulmonale: Secondary | ICD-10-CM | POA: Diagnosis not present

## 2020-07-11 ENCOUNTER — Telehealth: Payer: Self-pay | Admitting: Internal Medicine

## 2020-07-11 ENCOUNTER — Ambulatory Visit: Payer: Medicare HMO | Admitting: Podiatry

## 2020-07-11 NOTE — Telephone Encounter (Signed)
Ariel Medina ([place]) Call back # [phone]  [name] states patient has a small red spot on her back from a heating pad. She recommend it patient to put on some aloe vera.

## 2020-07-14 ENCOUNTER — Ambulatory Visit: Payer: Medicare HMO | Admitting: Radiation Oncology

## 2020-07-14 ENCOUNTER — Telehealth: Payer: Self-pay

## 2020-07-14 NOTE — Telephone Encounter (Signed)
Please advise 

## 2020-07-14 NOTE — Telephone Encounter (Signed)
Just an FYI

## 2020-07-14 NOTE — Telephone Encounter (Signed)
Okay to send the refill for Flexeril 5 mg 1 p.o. twice daily, #20, no refills.  Watch for somnolence. Okay to take Tylenol 500 mg 2 tablets 3 times a day as needed Other than above I do not think I stronger pain medication would be appropriate. Schedule office visit if: Not getting better or symptoms are increasing.

## 2020-07-14 NOTE — Telephone Encounter (Signed)
Pt called stating she has been having back spasms since last Wednesday.  She has been taking Flexaril 5mg  that Dr. [name] previously prescribed for her along with an OTC pain reliever, but is not getting any relief.  Pt is wondering of something can be called in a little stronger for her.

## 2020-07-15 MED ORDER — CYCLOBENZAPRINE HCL 5 MG PO TABS: 5.0000 mg | ORAL_TABLET | Freq: Two times a day (BID) | ORAL | 0 refills | Status: DC | PRN

## 2020-07-15 NOTE — Telephone Encounter (Signed)
LMOM informing Pt of recommendations. Flexeril 5mg  bid prn sent to [facility]. Instructed to call if questions/concerns.

## 2020-07-16 DIAGNOSIS — I2690 Septic pulmonary embolism without acute cor pulmonale: Secondary | ICD-10-CM | POA: Diagnosis not present

## 2020-07-16 DIAGNOSIS — Z48815 Encounter for surgical aftercare following surgery on the digestive system: Secondary | ICD-10-CM | POA: Diagnosis not present

## 2020-07-16 DIAGNOSIS — D649 Anemia, unspecified: Secondary | ICD-10-CM | POA: Diagnosis not present

## 2020-07-16 DIAGNOSIS — I82452 Acute embolism and thrombosis of left peroneal vein: Secondary | ICD-10-CM | POA: Diagnosis not present

## 2020-07-16 DIAGNOSIS — M800AXS Age-related osteoporosis with current pathological fracture, other site, sequela: Secondary | ICD-10-CM | POA: Diagnosis not present

## 2020-07-16 DIAGNOSIS — I129 Hypertensive chronic kidney disease with stage 1 through stage 4 chronic kidney disease, or unspecified chronic kidney disease: Secondary | ICD-10-CM | POA: Diagnosis not present

## 2020-07-16 DIAGNOSIS — I82431 Acute embolism and thrombosis of right popliteal vein: Secondary | ICD-10-CM | POA: Diagnosis not present

## 2020-07-16 DIAGNOSIS — I82441 Acute embolism and thrombosis of right tibial vein: Secondary | ICD-10-CM | POA: Diagnosis not present

## 2020-07-16 DIAGNOSIS — I82451 Acute embolism and thrombosis of right peroneal vein: Secondary | ICD-10-CM | POA: Diagnosis not present

## 2020-07-21 ENCOUNTER — Ambulatory Visit
Admission: RE | Admit: 2020-07-21 | Discharge: 2020-07-21 | Disposition: A | Discharge status: Home / Self Care | Payer: Medicare HMO | Source: Ambulatory Visit | Attending: Radiation Oncology | Admitting: Radiation Oncology

## 2020-07-21 DIAGNOSIS — C3412 Malignant neoplasm of upper lobe, left bronchus or lung: Secondary | ICD-10-CM | POA: Diagnosis not present

## 2020-07-21 DIAGNOSIS — Z510 Encounter for antineoplastic radiation therapy: Secondary | ICD-10-CM | POA: Insufficient documentation

## 2020-07-21 DIAGNOSIS — R911 Solitary pulmonary nodule: Secondary | ICD-10-CM

## 2020-07-22 DIAGNOSIS — I129 Hypertensive chronic kidney disease with stage 1 through stage 4 chronic kidney disease, or unspecified chronic kidney disease: Secondary | ICD-10-CM | POA: Diagnosis not present

## 2020-07-22 DIAGNOSIS — D649 Anemia, unspecified: Secondary | ICD-10-CM | POA: Diagnosis not present

## 2020-07-22 DIAGNOSIS — I2690 Septic pulmonary embolism without acute cor pulmonale: Secondary | ICD-10-CM | POA: Diagnosis not present

## 2020-07-22 DIAGNOSIS — M800AXS Age-related osteoporosis with current pathological fracture, other site, sequela: Secondary | ICD-10-CM | POA: Diagnosis not present

## 2020-07-22 DIAGNOSIS — I82441 Acute embolism and thrombosis of right tibial vein: Secondary | ICD-10-CM | POA: Diagnosis not present

## 2020-07-22 DIAGNOSIS — I82431 Acute embolism and thrombosis of right popliteal vein: Secondary | ICD-10-CM | POA: Diagnosis not present

## 2020-07-22 DIAGNOSIS — Z48815 Encounter for surgical aftercare following surgery on the digestive system: Secondary | ICD-10-CM | POA: Diagnosis not present

## 2020-07-22 DIAGNOSIS — I82452 Acute embolism and thrombosis of left peroneal vein: Secondary | ICD-10-CM | POA: Diagnosis not present

## 2020-07-22 DIAGNOSIS — I82451 Acute embolism and thrombosis of right peroneal vein: Secondary | ICD-10-CM | POA: Diagnosis not present

## 2020-07-23 ENCOUNTER — Ambulatory Visit: Payer: Medicare HMO | Admitting: Radiation Oncology

## 2020-07-25 ENCOUNTER — Ambulatory Visit: Payer: Medicare HMO | Admitting: Radiation Oncology

## 2020-07-25 DIAGNOSIS — C3412 Malignant neoplasm of upper lobe, left bronchus or lung: Secondary | ICD-10-CM | POA: Diagnosis not present

## 2020-07-25 DIAGNOSIS — Z510 Encounter for antineoplastic radiation therapy: Secondary | ICD-10-CM | POA: Diagnosis not present

## 2020-07-28 ENCOUNTER — Ambulatory Visit: Payer: Medicare HMO | Admitting: Radiation Oncology

## 2020-07-29 ENCOUNTER — Ambulatory Visit: Payer: Medicare HMO | Admitting: Radiation Oncology

## 2020-07-29 ENCOUNTER — Ambulatory Visit
Admission: RE | Admit: 2020-07-29 | Discharge: 2020-07-29 | Disposition: A | Discharge status: Home / Self Care | Payer: Medicare HMO | Source: Ambulatory Visit | Attending: Radiation Oncology | Admitting: Radiation Oncology

## 2020-07-29 DIAGNOSIS — Z510 Encounter for antineoplastic radiation therapy: Secondary | ICD-10-CM | POA: Diagnosis not present

## 2020-07-29 DIAGNOSIS — C3412 Malignant neoplasm of upper lobe, left bronchus or lung: Secondary | ICD-10-CM | POA: Diagnosis not present

## 2020-07-30 ENCOUNTER — Ambulatory Visit (INDEPENDENT_AMBULATORY_CARE_PROVIDER_SITE_OTHER): Payer: Medicare HMO | Admitting: Podiatry

## 2020-07-30 ENCOUNTER — Encounter: Payer: Self-pay | Admitting: Podiatry

## 2020-07-30 ENCOUNTER — Other Ambulatory Visit: Payer: Self-pay

## 2020-07-30 ENCOUNTER — Ambulatory Visit: Payer: Medicare HMO | Admitting: Radiation Oncology

## 2020-07-30 DIAGNOSIS — B351 Tinea unguium: Secondary | ICD-10-CM

## 2020-07-30 DIAGNOSIS — Q828 Other specified congenital malformations of skin: Secondary | ICD-10-CM

## 2020-07-30 DIAGNOSIS — M79676 Pain in unspecified toe(s): Secondary | ICD-10-CM

## 2020-07-30 NOTE — Progress Notes (Signed)
This patient returns to the office for evaluation and treatment of long thick painful nails .  This patient is unable to trim her own nails since the patient cannot reach the feet.  Patient says the nails are painful walking and wearing her shoes.  She has painful callus on the outside of her right foot.    She  returns for preventive foot care services.   General Appearance  Alert, conversant and in no acute stress.  Vascular  Dorsalis pedis and posterior tibial  pulses are weakly  palpable  bilaterally.  Capillary return is within normal limits  bilaterally. Temperature is within normal limits  bilaterally.  Neurologic  Senn-Weinstein monofilament wire test within normal limits  bilaterally. Muscle power within normal limits bilaterally.  Nails Thick disfigured discolored nails with subungual debris  from hallux to fifth toes bilaterally. No evidence of bacterial infection or drainage bilaterally.  Orthopedic  No limitations of motion  feet .  No crepitus or effusions noted.  No bony pathology or digital deformities noted. HAV  B/L  Skin  normotropic skin  noted bilaterally.  No signs of infections or ulcers noted.   Porokeratosis sub 5th met right foot.  Callus secondary HAV  B/L.  Onychomycosis  Pain in toes right foot  Pain in toes left foot  Porokeratosis sub 5th met right foot.  Debridement  of nails  1-5  B/L with a nail nipper.  Nails were then filed using a dremel tool with no incidents.  Debride porokeratosis with dremel tool     RTC 10 weeks   Gardiner Barefoot DPM

## 2020-07-31 ENCOUNTER — Ambulatory Visit
Admission: RE | Admit: 2020-07-31 | Discharge: 2020-07-31 | Disposition: A | Discharge status: Home / Self Care | Payer: Medicare HMO | Source: Ambulatory Visit | Attending: Radiation Oncology | Admitting: Radiation Oncology

## 2020-07-31 ENCOUNTER — Inpatient Hospital Stay: Payer: Medicare HMO | Attending: Radiation Oncology

## 2020-07-31 ENCOUNTER — Ambulatory Visit: Payer: Medicare HMO | Admitting: Radiation Oncology

## 2020-07-31 DIAGNOSIS — Z510 Encounter for antineoplastic radiation therapy: Secondary | ICD-10-CM | POA: Diagnosis not present

## 2020-07-31 DIAGNOSIS — Z23 Encounter for immunization: Secondary | ICD-10-CM | POA: Insufficient documentation

## 2020-07-31 DIAGNOSIS — C3412 Malignant neoplasm of upper lobe, left bronchus or lung: Secondary | ICD-10-CM | POA: Diagnosis not present

## 2020-08-04 ENCOUNTER — Ambulatory Visit: Payer: Medicare HMO | Admitting: Radiation Oncology

## 2020-08-05 ENCOUNTER — Encounter: Payer: Self-pay | Admitting: Radiation Oncology

## 2020-08-05 ENCOUNTER — Ambulatory Visit
Admission: RE | Admit: 2020-08-05 | Discharge: 2020-08-05 | Disposition: A | Discharge status: Home / Self Care | Payer: Medicare HMO | Source: Ambulatory Visit | Attending: Radiation Oncology | Admitting: Radiation Oncology

## 2020-08-05 DIAGNOSIS — C3412 Malignant neoplasm of upper lobe, left bronchus or lung: Secondary | ICD-10-CM | POA: Diagnosis not present

## 2020-08-05 DIAGNOSIS — Z923 Personal history of irradiation: Secondary | ICD-10-CM

## 2020-08-05 DIAGNOSIS — R911 Solitary pulmonary nodule: Secondary | ICD-10-CM

## 2020-08-05 DIAGNOSIS — Z510 Encounter for antineoplastic radiation therapy: Secondary | ICD-10-CM | POA: Diagnosis not present

## 2020-08-05 HISTORY — DX: Personal history of irradiation: Z92.3

## 2020-08-08 ENCOUNTER — Other Ambulatory Visit: Payer: Self-pay | Admitting: Internal Medicine

## 2020-08-18 ENCOUNTER — Other Ambulatory Visit: Payer: Self-pay | Admitting: Internal Medicine

## 2020-08-18 DIAGNOSIS — E039 Hypothyroidism, unspecified: Secondary | ICD-10-CM

## 2020-08-20 ENCOUNTER — Ambulatory Visit (HOSPITAL_BASED_OUTPATIENT_CLINIC_OR_DEPARTMENT_OTHER)
Admission: RE | Admit: 2020-08-20 | Discharge: 2020-08-20 | Disposition: A | Discharge status: Home / Self Care | Payer: Medicare HMO | Source: Ambulatory Visit | Attending: Pulmonary Disease | Admitting: Pulmonary Disease

## 2020-08-20 DIAGNOSIS — I824Y3 Acute embolism and thrombosis of unspecified deep veins of proximal lower extremity, bilateral: Secondary | ICD-10-CM | POA: Diagnosis not present

## 2020-08-20 DIAGNOSIS — I8290 Acute embolism and thrombosis of unspecified vein: Secondary | ICD-10-CM | POA: Diagnosis not present

## 2020-08-21 ENCOUNTER — Telehealth: Payer: Self-pay | Admitting: Pulmonary Disease

## 2020-08-21 NOTE — Telephone Encounter (Signed)
ATC patient to go over VAS [place] results LMTCB

## 2020-08-21 NOTE — Telephone Encounter (Signed)
Spoke with patient to let her know of VAS [place] results per Aaron Edelman. Patient expressed understanding. Advised to let us know if she needs anything else and to keep her follow up. Nothing further needed at this time.

## 2020-09-02 ENCOUNTER — Encounter: Payer: Self-pay | Admitting: Internal Medicine

## 2020-09-02 ENCOUNTER — Other Ambulatory Visit: Payer: Self-pay

## 2020-09-02 ENCOUNTER — Telehealth (INDEPENDENT_AMBULATORY_CARE_PROVIDER_SITE_OTHER): Payer: Medicare HMO | Admitting: Internal Medicine

## 2020-09-02 VITALS — Ht 63.0 in | Wt 115.0 lb

## 2020-09-02 DIAGNOSIS — Z09 Encounter for follow-up examination after completed treatment for conditions other than malignant neoplasm: Secondary | ICD-10-CM

## 2020-09-02 DIAGNOSIS — I10 Essential (primary) hypertension: Secondary | ICD-10-CM | POA: Diagnosis not present

## 2020-09-02 DIAGNOSIS — I2699 Other pulmonary embolism without acute cor pulmonale: Secondary | ICD-10-CM

## 2020-09-02 NOTE — Progress Notes (Signed)
Subjective:    Patient ID: Ariel Medina, female    DOB: 30-May-1928, 87 y.o.   MRN: [id number]  DOS:  09/02/2020 Type of visit - description: Virtual Visit via Telephone    I connected with above mentioned patient  by telephone and verified that I am speaking with the correct person using two identifiers.  THIS ENCOUNTER IS A VIRTUAL VISIT DUE TO COVID-19 - PATIENT WAS NOT SEEN IN THE OFFICE. PATIENT HAS CONSENTED TO VIRTUAL VISIT / TELEMEDICINE VISIT   Location of patient: home  Location of provider: office  Persons participating in the virtual visit: patient, provider   I discussed the limitations, risks, security and privacy concerns of performing an evaluation and management service by telephone and the availability of in person appointments. I also discussed with the patient that there may be a patient responsible charge related to this service. The patient expressed understanding and agreed to proceed.  Follow-up Last seen in October 2021 Since then she has seen pulmonary, radiation oncology. Medication list reviewed. Overall she feels well. She is a staying mostly at home, family is very supportive.  Denies any fever chills. No nausea vomiting No blood in the stools.  Review of Systems See above   Past Medical History:  Diagnosis Date  . Anemia   . Breast CA ([place])    left breast-invasive ductal ca stage 1-stopped arimidex 2008  . Diverticulitis of colon   . GI bleed 04/2020  . Hiatal hernia   . Hyperlipidemia   . Hypertension   . Hypothyroidism   . Osteoarthritis   . Osteopenia    DEXA 7/02  . Syncope 2005   CT head (-), ECHO essent. neg, stress test (-), carotid u/s (-)    Past Surgical History:  Procedure Laterality Date  . BREAST BIOPSY Bilateral   . BREAST LUMPECTOMY  04/2002   left  . PARTIAL COLECTOMY N/A 05/01/2020   Procedure: PARTIAL COLECTOMY;  Surgeon: Coralie Keens, MD;  Location: [facility];  Service: General;  Laterality: N/A;  .  TONSILLECTOMY AND ADENOIDECTOMY      Allergies as of 09/02/2020      Reactions   Ibuprofen    REACTION: bleed      Medication List       Accurate as of September 02, 2020 10:12 AM. If you have any questions, ask your nurse or doctor.        apixaban 5 MG Tabs tablet Commonly known as: ELIQUIS Take 1 tablet (5 mg total) by mouth 2 (two) times daily.   bisacodyl 10 MG suppository Commonly known as: DULCOLAX Place 1 suppository (10 mg total) rectally as needed for moderate constipation.   cyclobenzaprine 5 MG tablet Commonly known as: FLEXERIL Take 1 tablet (5 mg total) by mouth 2 (two) times daily as needed for muscle spasms.   feeding supplement Liqd Take 237 mLs by mouth 2 (two) times daily between meals.   fluticasone 50 MCG/ACT nasal spray Commonly known as: FLONASE Place 2 sprays into both nostrils daily as needed for allergies or rhinitis.   hydrochlorothiazide 25 MG tablet Commonly known as: HYDRODIURIL Take 1 tablet (25 mg total) by mouth daily.   ICAPS AREDS 2 PO Take 1 capsule by mouth 2 (two) times daily.   levothyroxine 75 MCG tablet Commonly known as: SYNTHROID Take 1 tablet (75 mcg total) by mouth daily before breakfast.   polyethylene glycol 17 g packet Commonly known as: MIRALAX / GLYCOLAX Take 17 g by mouth daily  as needed for moderate constipation.   potassium chloride 10 MEQ tablet Commonly known as: KLOR-CON Take 1 tablet (10 mEq total) by mouth daily.   psyllium 95 % Pack Commonly known as: HYDROCIL/METAMUCIL Take 1 packet by mouth daily. Hold for diarrhoea   rosuvastatin 5 MG tablet Commonly known as: CRESTOR Take 1 tablet (5 mg total) by mouth 3 (three) times a week. No specific days          Objective:   Physical Exam Ht 5\' 3"  (1.6 m)   Wt 115 lb (52.2 kg)   BMI 20.37 kg/m  This is a telephone visit, she sounded well, at baseline, alert oriented x3.  Not in any distress.    Assessment      Assessment   HTN Hyperlipidemia Hypothyroidism Osteopenia:  --T score -2.1 (04-2012) DC Fosamax 12/2013 after 5 years --T score -1.5   (02-2015)  --T score -3.6 (april 2019) on Prolia DJD GERD  Tremors, likely essential H/o diverticulitis Breast cancer, L breast, release from oncology 2013 Syncope 2005 11-2016: Fall, FX L pubic ramus, admitted then d/c to a NH, back home 12-18-16 Edema, R>L U/S (-)  for DVT 01/2017  PLAN: HTN: Currently on HCTZ, potassium.  No recent ambulatory BPs, last BMP okay.  No change, reassess in 2 months Pulmonary nodule: Seen by pulmonary, PET scan show hypermetabolic uptake, presumptive dx Non-small cell lung cancer, given comorbidities they elected to proceed with empiric radiation therapy. DVT, bilateral PE w/ respiratory failure September 2021: [place] 08/20/2020: No evidence of DVT on either leg. She is anticoagulated, tolerates well, will reassess need for anticoagulation when she returns in 2 months. Intussusception: Reports bowel movements are normal.  No issues. Preventive care: Had COVID vaccines x3 a flu shot RTC 6 to 8 weeks, will call and set up.   I discussed the assessment and treatment plan with the patient. The patient was provided an opportunity to ask questions and all were answered. The patient agreed with the plan and demonstrated an understanding of the instructions.   The patient was advised to call back or seek an in-person evaluation if the symptoms worsen or if the condition fails to improve as anticipated.  I provided 19 minutes of non-face-to-face time during this encounter.  Kathlene November, MD

## 2020-09-02 NOTE — Progress Notes (Signed)
Pre visit review using our clinic review tool, if applicable. No additional management support is needed unless otherwise documented below in the visit note. 

## 2020-09-03 NOTE — Assessment & Plan Note (Signed)
HTN: Currently on HCTZ, potassium.  No recent ambulatory BPs, last BMP okay.  No change, reassess in 2 months Pulmonary nodule: Seen by pulmonary, PET scan show hypermetabolic uptake, presumptive dx Non-small cell lung cancer, given comorbidities they elected to proceed with empiric radiation therapy. DVT, bilateral PE w/ respiratory failure September 2021: [place] 08/20/2020: No evidence of DVT on either leg. She is anticoagulated, tolerates well, will reassess need for anticoagulation when she returns in 2 months. Intussusception: Reports bowel movements are normal.  No issues. Preventive care: Had COVID vaccines x3 a flu shot RTC 6 to 8 weeks, will call and set up.

## 2020-09-23 ENCOUNTER — Telehealth: Payer: Self-pay | Admitting: Internal Medicine

## 2020-09-23 ENCOUNTER — Telehealth: Payer: Self-pay | Admitting: Pharmacist

## 2020-09-23 DIAGNOSIS — I2699 Other pulmonary embolism without acute cor pulmonale: Secondary | ICD-10-CM

## 2020-09-23 MED ORDER — APIXABAN 5 MG PO TABS: 5.0000 mg | ORAL_TABLET | Freq: Two times a day (BID) | ORAL | 3 refills | Status: DC

## 2020-09-23 NOTE — Telephone Encounter (Signed)
Patient states she will start using [place] for apixaban (ELIQUIS) 5 MG TABS tablet [[id number]]. Patient just making the office aware of the change

## 2020-09-23 NOTE — Telephone Encounter (Signed)
Noted  

## 2020-09-23 NOTE — Progress Notes (Addendum)
Chronic Care Management Pharmacy Assistant   Name: Ariel Medina  MRN: [id number] DOB: [birth year]  Reason for Encounter:General Disease State Call  Patient Questions:  1.  Have you seen any other providers since your last visit? Yes   2.  Any changes in your medicines or health? Yes   PCP : Colon Branch, MD   Their chronic conditions include: Hypertension, Hyperlipidemia, Hypothyroidism, Osteoporosis.  Office Visits: 09/02/20 Kathlene November, Johnette Abraham, MD. Hypertension/Follow up. No medication changes.  06/02/20 Colon Branch, MD. Hypertension. Per note: Drink plenty of fluids, drink ensure daily. Check blood pressure daily. No medication changes. 04/29/20 Colon Branch, MD. Diarrhea.Per note: Hemoccult positive. Advised to go to the ER. No medication changes.  04/25/20 Fam med. Saguier, Percell Miller, PA-C. Diarrhea.X-ray taken, Per note: Recommend hydrate well and eat bland diet.  Avoid greasy and fried foods.  Also avoid any fruit juices. Labs drawn. No medication changes.  Consults: 07/30/20 Podiatry. Gardiner Barefoot, DPM. For painful toe nails. Per note: Nails were then filed using a dremel tool with no incidents.   No medication changes. 06/26/20 Pulmonology. Icard, Bradley L, DO. Follow up from PET scan. Per note: Patient and doctor decided on a empiric radiation treatment and a possible biopsy.No medication changes.  06/11/20 Pulmonology. Icard, Bradley L, DO. Follow up from CT scheduled from 05/30/20. Per note: From CT findings (lesion) they decided on a nuclear medicine PET Scan. No medication changes.   05/30/20 Pulmonology Lauraine Rinne, NP. No medication changes.   Hospital: 04/29/20 Debbe Odea, MD. Partial Colectomy Dr. [name] advised patient to go to the ER. Acute pulmonary embolism. Surgery performed on 05/01/20. Discharged 05/08/20 STOPPED Aspirin and Cyclobenzaprine.  Allergies:   Allergies  Allergen Reactions   Ibuprofen     REACTION: bleed    Medications: Outpatient Encounter  Medications as of 09/23/2020  Medication Sig   apixaban (ELIQUIS) 5 MG TABS tablet Take 1 tablet (5 mg total) by mouth 2 (two) times daily.   cyclobenzaprine (FLEXERIL) 5 MG tablet Take 1 tablet (5 mg total) by mouth 2 (two) times daily as needed for muscle spasms.   feeding supplement, ENSURE ENLIVE, (ENSURE ENLIVE) LIQD Take 237 mLs by mouth 2 (two) times daily between meals.   fluticasone (FLONASE) 50 MCG/ACT nasal spray Place 2 sprays into both nostrils daily as needed for allergies or rhinitis. (Patient not taking: No sig reported)   hydrochlorothiazide (HYDRODIURIL) 25 MG tablet Take 1 tablet (25 mg total) by mouth daily.   levothyroxine (SYNTHROID) 75 MCG tablet Take 1 tablet (75 mcg total) by mouth daily before breakfast.   potassium chloride (KLOR-CON) 10 MEQ tablet Take 1 tablet (10 mEq total) by mouth daily.   rosuvastatin (CRESTOR) 5 MG tablet Take 1 tablet (5 mg total) by mouth 3 (three) times a week. No specific days   No facility-administered encounter medications on file as of 09/23/2020.    Current Diagnosis: Patient Active Problem List   Diagnosis Date Noted   Solitary pulmonary nodule 07/09/2020   Abnormal findings on diagnostic imaging of lung 05/30/2020   Acute deep vein thrombosis (DVT) of proximal vein of both lower extremities ([place]) 05/30/2020   VTE (venous thromboembolism) 05/30/2020   Goals of care, counseling/discussion    Palliative care by specialist    DNR (do not resuscitate) discussion    Intussusception ([place]) 04/29/2020   Pulmonary embolism, bilateral ([place]) 04/29/2020   GI bleed 04/29/2020   Abnormal CT scan of lung 04/29/2020   Hypoxia  04/29/2020   Acute pulmonary embolism ([place]) 04/29/2020   Pre-ulcerative calluses 01/25/2020   Essential hypertension 11/29/2016   Closed fracture of multiple pubic rami ([facility]) 11/29/2016   Fall    Closed fracture of left pubis ([place]) 11/28/2016   Hypokalemia 11/28/2016   Hearing loss 09/19/2015   PCP NOTES >>>  05/13/2015   Allergic rhinitis 10/24/2014   Annual physical exam 04/14/2011   BACK PAIN, shoulder pain 12/11/2007   Osteoporosis 10/20/2007   NEOPLASM, MALIGNANT, BREAST, HX OF 10/20/2007   Hypothyroidism 01/30/2007   Hyperlipidemia 01/30/2007   HTN and mild edema 01/30/2007   OSTEOARTHRITIS 01/30/2007    Goals Addressed   None    GEN Call: Patient stated she is still cooks and cleans and she lives alone. She stated she doesn't drink enough water and she knows she needs to do better, she mainly drinks coffee with milk. Patient stated she loves to eat junk food and usually eats two meals a day she noticed she has a small appetite. She stated she eats baked chicken, some pork, vegetables and some fruit.Patient does not check her blood pressure at all, she does not have a machine at home. She informed that she uses [employer] order and [facility] if there is a medication that she needs right away. She stated she does not have questions or concerns about her medications at this time.   Follow-Up:  Pharmacist Review   Charlann Lange, RMA Clinical Pharmacist Assistant [phone]  4 minutes spent in review, coordination, and documentation.  Reviewed by: Beverly Milch, PharmD Clinical Pharmacist [facility] [phone]

## 2020-09-23 NOTE — Addendum Note (Signed)
Addended byDamita Dunnings D on: 09/23/2020 02:29 PM   Modules accepted: Orders

## 2020-10-06 ENCOUNTER — Telehealth: Payer: Self-pay | Admitting: Pharmacist

## 2020-10-06 NOTE — Progress Notes (Signed)
    Chronic Care Management Pharmacy Assistant   Name: Ariel Medina  MRN: [id number] DOB: 1928/02/06  Reason for Encounter: Adherence Review  PCP : Colon Branch, MD  Verified Adherence Gap Information. Per insurance data patient has met their annual wellness visit and wellness bundle screening.Their most recent blood pressure was 131/82 on 06/02/20. Reviewed the patients chart for any health and/or medication changes since the last encounter with CCM. There were not any changes to be found at this time.   Follow-Up:  Pharmacist Review   Charlann Lange, [place] Pharmacist Assistant [phone]

## 2020-10-08 NOTE — Progress Notes (Unsigned)
Chronic Care Management Pharmacy Note  10/08/2020 Name:  Ariel Medina MRN:  [id number] DOB:  [birth year]  Subjective: Ariel Medina is an 87 y.o. year old female who is a primary patient of Ariel Medina, Ariel Berthold, MD.  The CCM team was consulted for assistance with disease management and care coordination needs.    Engaged with patient by telephone for follow up visit in response to provider referral for pharmacy case management and/or care coordination services.   Consent to Services:  The patient was given the following information about Chronic Care Management services today, agreed to services, and gave verbal consent: 1. CCM service includes personalized support from designated clinical staff supervised by the primary care provider, including individualized plan of care and coordination with other care providers 2. 24/7 contact phone numbers for assistance for urgent and routine care needs. 3. Service will only be billed when office clinical staff spend 20 minutes or more in a month to coordinate care. 4. Only one practitioner may furnish and bill the service in a calendar month. 5.The patient may stop CCM services at any time (effective at the end of the month) by phone call to the office staff. 6. The patient will be responsible for cost sharing (co-pay) of up to 20% of the service fee (after annual deductible is met). Patient agreed to services and consent obtained.  Patient Care Team: Ariel Branch, MD as PCP - General Ariel Breeding, MD as PCP - Cardiology (Cardiology) Ariel Sartorius, MD as Consulting Physician (Otolaryngology) Ariel Medina, DPM as Consulting Physician (Podiatry) Ariel Medina, Ariel Medina, [facility] (Inactive) as Pharmacist (Pharmacist)  Recent office visits: 09/02/20 Ariel Medina) - video visit, no medication changes  Recent consult visits: 07/30/20 Ariel Medina) - pain in toes, nail debridement was successful, regular f/u in 10 weeks  Hospital visits: {Hospital DC  Yes/No:25215}  Objective:  Lab Results  Component Value Date   CREATININE 0.67 06/02/2020   BUN 8 06/02/2020   GFR 84.29 06/26/2019   GFRNONAA >60 05/12/2020   GFRAA >60 05/12/2020   NA 139 06/02/2020   K 4.1 06/02/2020   CALCIUM 10.0 06/02/2020   CO2 33 (H) 06/02/2020    Lab Results  Component Value Date/Time   GFR 84.29 06/26/2019 09:54 AM   GFR 94.17 01/26/2019 10:19 AM    Last diabetic Eye exam: No results found for: HMDIABEYEEXA  Last diabetic Foot exam: No results found for: HMDIABFOOTEX   Lab Results  Component Value Date   CHOL 248 (H) 06/26/2019   HDL 60.60 06/26/2019   LDLCALC 169 (H) 06/26/2019   LDLDIRECT 150.2 06/07/2013   TRIG 96.0 06/26/2019   CHOLHDL 4 06/26/2019    Hepatic Function Latest Ref Rng & Units 06/02/2020 05/08/2020 05/05/2020  Total Protein 6.1 - 8.1 g/dL 6.4 4.4(L) 3.9(L)  Albumin 3.5 - 5.0 g/dL - 2.1(L) 1.8(L)  AST 10 - 35 U/L 14 18 14(L)  ALT 6 - 29 U/L 4(L) 11 8  Alk Phosphatase 38 - 126 U/L - 31(L) 27(L)  Total Bilirubin 0.2 - 1.2 mg/dL 0.5 0.7 0.5    Lab Results  Component Value Date/Time   TSH 3.92 06/02/2020 12:15 PM   TSH 4.604 (H) 05/01/2020 03:15 AM   TSH 3.44 06/26/2019 09:54 AM    CBC Latest Ref Rng & Units 06/02/2020 05/12/2020 05/11/2020  WBC 3.8 - 10.8 Thousand/uL 5.4 6.8 7.3  Hemoglobin 11.7 - 15.5 g/dL 10.6(L) 8.5(L) 8.4(L)  Hematocrit 35.0 - 45.0 % 32.8(L) 27.0(L) 26.5(L)  Platelets 140 - 400  Thousand/uL 348 254 260    Lab Results  Component Value Date/Time   VD25OH 44 11/11/2011 10:13 AM   VD25OH 40 11/11/2010 10:06 AM    Clinical ASCVD: {YES/NO:21197} The ASCVD Risk score Mikey Bussing DC Jr., et al., 2013) failed to calculate for the following reasons:   The 2013 ASCVD risk score is only valid for ages 72 to 76    Depression screen PHQ 2/9 09/02/2020 03/03/2020 03/03/2020  Decreased Interest 0 0 0  Down, Depressed, Hopeless 0 0 0  PHQ - 2 Score 0 0 0  Some recent data might be hidden     ***Other:  (CHADS2VASc if Afib, MMRC or CAT for COPD, ACT, DEXA)  Social History   Tobacco Use  Smoking Status Never Smoker  Smokeless Tobacco Never Used   BP Readings from Last 3 Encounters:  07/09/20 (!) 145/94  07/09/20 (!) 145/94  06/26/20 134/80   Pulse Readings from Last 3 Encounters:  07/09/20 79  07/09/20 79  06/26/20 80   Wt Readings from Last 3 Encounters:  09/02/20 115 lb (52.2 kg)  07/09/20 117 lb 9.6 oz (53.3 kg)  06/26/20 118 lb (53.5 kg)    Assessment/Interventions: Review of patient past medical history, allergies, medications, health status, including review of consultants reports, laboratory and other test data, was performed as part of comprehensive evaluation and provision of chronic care management services.   SDOH:  (Social Determinants of Health) assessments and interventions performed: {yes/no:20286}   CCM Care Plan  Allergies  Allergen Reactions  . Ibuprofen     REACTION: bleed    Medications Reviewed Today    Reviewed by Ariel Branch, MD (Physician) on 09/03/20 at 1329  Med List Status: <None>  Medication Order Taking? Sig Documenting Provider Last Dose Status Informant  apixaban (ELIQUIS) 5 MG TABS tablet [id number] Yes Take 1 tablet (5 mg total) by mouth 2 (two) times daily. Ariel Branch, MD Taking Active   cyclobenzaprine (FLEXERIL) 5 MG tablet [id number] Yes Take 1 tablet (5 mg total) by mouth 2 (two) times daily as needed for muscle spasms. Ariel Branch, MD Taking Active   feeding supplement, ENSURE Ariel Medina) LIQD [id number] Yes Take 237 mLs by mouth 2 (two) times daily between meals. Ariel Dada, MD Taking Active   fluticasone [facility]) 50 MCG/ACT nasal spray [id number] No Place 2 sprays into both nostrils daily as needed for allergies or rhinitis.  Patient not taking: No sig reported   Ariel Medina, Ariel C, NP Not Taking Active   hydrochlorothiazide (HYDRODIURIL) 25 MG tablet [id number] Yes Take 1 tablet (25 mg total) by mouth daily.  Ariel Branch, MD Taking Active   levothyroxine (SYNTHROID) 75 MCG tablet [id number] Yes Take 1 tablet (75 mcg total) by mouth daily before breakfast. Ariel Branch, MD Taking Active   potassium chloride (KLOR-CON) 10 MEQ tablet [id number] Yes Take 1 tablet (10 mEq total) by mouth daily. Ariel Branch, MD Taking Active   rosuvastatin (CRESTOR) 5 MG tablet [id number] Yes Take 1 tablet (5 mg total) by mouth 3 (three) times a week. No specific days Ariel Medina, Nelda Bucks, NP Taking Active   Med List Note Corky Mull, CPhT 05/08/20 0630): [employer]          Patient Active Problem List   Diagnosis Date Noted  . Solitary pulmonary nodule 07/09/2020  . Abnormal findings on diagnostic imaging of lung 05/30/2020  . Acute deep vein thrombosis (DVT) of proximal vein of both lower  extremities ([place]) 05/30/2020  . VTE (venous thromboembolism) 05/30/2020  . Goals of care, counseling/discussion   . Palliative care by specialist   . DNR (do not resuscitate) discussion   . Intussusception ([place]) 04/29/2020  . Pulmonary embolism, bilateral ([place]) 04/29/2020  . GI bleed 04/29/2020  . Abnormal CT scan of lung 04/29/2020  . Hypoxia 04/29/2020  . Acute pulmonary embolism ([place]) 04/29/2020  . Pre-ulcerative calluses 01/25/2020  . Essential hypertension 11/29/2016  . Closed fracture of multiple pubic rami ([place]) 11/29/2016  . Fall   . Closed fracture of left pubis ([place]) 11/28/2016  . Hypokalemia 11/28/2016  . Hearing loss 09/19/2015  . PCP NOTES >>> 05/13/2015  . Allergic rhinitis 10/24/2014  . Annual physical exam 04/14/2011  . BACK PAIN, shoulder pain 12/11/2007  . Osteoporosis 10/20/2007  . NEOPLASM, MALIGNANT, BREAST, HX OF 10/20/2007  . Hypothyroidism 01/30/2007  . Hyperlipidemia 01/30/2007  . HTN and mild edema 01/30/2007  . OSTEOARTHRITIS 01/30/2007    Immunization History  Administered Date(s) Administered  . Fluad Quad(high Dose 65+) 06/02/2020  . H1N1 08/29/2008  . Influenza Whole  05/12/2010  . Influenza, High Dose Seasonal PF 06/07/2013, 05/13/2015, 07/20/2016, 04/20/2017, 05/25/2018, 05/28/2019  . Influenza,inj,Quad PF,6+ Mos 06/10/2014  . PFIZER(Purple Top)SARS-COV-2 Vaccination 09/30/2019, 10/24/2019, 07/31/2020  . PPD Test 12/14/2016  . Pneumococcal Conjugate-13 06/10/2014  . Pneumococcal Polysaccharide-23 07/10/2003, 02/21/2008, 11/23/2017  . Td 05/16/2007, 05/25/2018  . Zoster 12/13/2012  . Zoster Recombinat (Shingrix) 10/12/2018, 06/28/2019    Conditions to be addressed/monitored:  Hypertension, Hyperlipidemia, Hypothyroidism, Osteoporosis  There are no care plans that you recently modified to display for this patient.    Medication Assistance: {MEDASSISTANCEINFO:25044}  Patient's preferred pharmacy is:  [facility], [place] [place] [address] Phone: [phone] Fax: [facility], [place] [address] Phone: [phone] Fax: [phone]  Uses pill box? {Yes or If no, why not?:20788} Pt endorses ***% compliance  We discussed: {Pharmacy options:24294} Patient decided to: {US Pharmacy Plan:23885}  Care Plan and Follow Up Patient Decision:  {FOLLOWUP:24991}  Plan: {CM FOLLOW UP PLAN:25073}  ***  Current Barriers:  . {pharmacybarriers:24917} . ***  Pharmacist Clinical Goal(s):  Marland Kitchen Over the next *** days, patient will {PHARMACYGOALCHOICES:24921} through collaboration with PharmD and provider.  . ***  Interventions: . 1:1 collaboration with Ariel Branch, MD regarding development and update of comprehensive plan of care as evidenced by provider attestation and co-signature . Inter-disciplinary care team collaboration (see longitudinal plan of care) . Comprehensive medication review performed; medication list updated in electronic medical record  Hypertension (BP goal {CHL HP  UPSTREAM Pharmacist BP ranges:[phone]}) -{US controlled/uncontrolled:25276} -Current treatment:  HCTZ 3m daily -Medications previously tried: ***  -Current home readings: *** -Current dietary habits:  Loves sweets and bread.  "I love junk food" B - Cookies; bacon; sausage and grits (doesn't eat eggs as often) L - [place] sandwich D - Varies Snacks - cookies, potatoe chips, crackers Drinks - coke, pepsi, dr. PMalachi Bonds 2-3 cups of coffee w/ milk (no sugar or sweetener) -Current exercise habits: *** -{ACTIONS;DENIES/REPORTS:21021675::"Denies"} hypotensive/hypertensive symptoms -Educated on {CCM BP Counseling:25124} -Counseled to monitor BP at home ***, document, and provide log at future appointments -{CCMPHARMDINTERVENTION:25122}  Hyperlipidemia: (LDL goal < ***) -{US controlled/uncontrolled:25276} -Current treatment:  Rosuvastatin 528mdaily -Medications previously tried: ***  -Current dietary patterns: *** -Current exercise habits: *** -Educated on {CCM HLD Counseling:25126} -{CCMPHARMDINTERVENTION:25122}  Osteoporosis / Osteopenia (Goal ***) -{US controlled/uncontrolled:25276} -Last DEXA Scan: ***   T-Score femoral neck: ***  T-Score total hip: ***  T-Score lumbar spine: ***  T-Score forearm radius: ***  10-year probability of major osteoporotic fracture: ***  10-year probability of hip fracture: *** -Patient {is;is not an osteoporosis candidate:23886} -Current treatment   Calcium/Vitamin D 616m/400 units daily -Medications previously tried: ***  -{Osteoporosis Counseling:23892} -{CCMPHARMDINTERVENTION:25122}  Hypothyroidism (Goal: ***) -{US controlled/uncontrolled:25276} -Current treatment   Levothyroxine 749m daily -Medications previously tried: ***  -{CCMPHARMDINTERVENTION:25122}   Patient Goals/Self-Care Activities . Over the next *** days, patient will:  - {pharmacypatientgoals:24919}  Follow Up Plan: {CM FOLLOW UP PLKRCV:81840}

## 2020-10-09 ENCOUNTER — Telehealth: Payer: Medicare HMO

## 2020-10-22 ENCOUNTER — Other Ambulatory Visit: Payer: Self-pay | Admitting: Internal Medicine

## 2020-10-28 ENCOUNTER — Ambulatory Visit (INDEPENDENT_AMBULATORY_CARE_PROVIDER_SITE_OTHER): Payer: Medicare HMO | Admitting: Internal Medicine

## 2020-10-28 ENCOUNTER — Other Ambulatory Visit: Payer: Self-pay

## 2020-10-28 ENCOUNTER — Encounter: Payer: Self-pay | Admitting: Internal Medicine

## 2020-10-28 VITALS — BP 142/80 | HR 82 | Temp 98.0°F | Resp 20 | Ht 63.0 in | Wt 119.0 lb

## 2020-10-28 DIAGNOSIS — E039 Hypothyroidism, unspecified: Secondary | ICD-10-CM

## 2020-10-28 DIAGNOSIS — M810 Age-related osteoporosis without current pathological fracture: Secondary | ICD-10-CM

## 2020-10-28 DIAGNOSIS — I10 Essential (primary) hypertension: Secondary | ICD-10-CM

## 2020-10-28 DIAGNOSIS — R269 Unspecified abnormalities of gait and mobility: Secondary | ICD-10-CM | POA: Diagnosis not present

## 2020-10-28 DIAGNOSIS — E785 Hyperlipidemia, unspecified: Secondary | ICD-10-CM | POA: Diagnosis not present

## 2020-10-28 DIAGNOSIS — Z7901 Long term (current) use of anticoagulants: Secondary | ICD-10-CM

## 2020-10-28 LAB — CBC WITH DIFFERENTIAL/PLATELET
Basophils Absolute: 0 10*3/uL (ref 0.0–0.1)
Basophils Relative: 0.4 % (ref 0.0–3.0)
Eosinophils Absolute: 0.1 10*3/uL (ref 0.0–0.7)
Eosinophils Relative: 1 % (ref 0.0–5.0)
HCT: 36.9 % (ref 36.0–46.0)
Hemoglobin: 12.1 g/dL (ref 12.0–15.0)
Lymphocytes Relative: 21.1 % (ref 12.0–46.0)
Lymphs Abs: 1.1 10*3/uL (ref 0.7–4.0)
MCHC: 32.8 g/dL (ref 30.0–36.0)
MCV: 80.1 fl (ref 78.0–100.0)
Monocytes Absolute: 0.6 10*3/uL (ref 0.1–1.0)
Monocytes Relative: 11 % (ref 3.0–12.0)
Neutro Abs: 3.5 10*3/uL (ref 1.4–7.7)
Neutrophils Relative %: 66.5 % (ref 43.0–77.0)
Platelets: 256 10*3/uL (ref 150.0–400.0)
RBC: 4.61 Mil/uL (ref 3.87–5.11)
RDW: 19 % — ABNORMAL HIGH (ref 11.5–15.5)
WBC: 5.2 10*3/uL (ref 4.0–10.5)

## 2020-10-28 LAB — BASIC METABOLIC PANEL
BUN: 15 mg/dL (ref 6–23)
CO2: 34 mEq/L — ABNORMAL HIGH (ref 19–32)
Calcium: 9.5 mg/dL (ref 8.4–10.5)
Chloride: 97 mEq/L (ref 96–112)
Creatinine, Ser: 0.66 mg/dL (ref 0.40–1.20)
GFR: 77.16 mL/min (ref >60.00)
Glucose, Bld: 87 mg/dL (ref 70–99)
Potassium: 3.9 mEq/L (ref 3.5–5.1)
Sodium: 138 mEq/L (ref 135–145)

## 2020-10-28 LAB — TSH: TSH: 3.47 u[IU]/mL (ref 0.35–4.50)

## 2020-10-28 MED ORDER — DENOSUMAB 60 MG/ML ~~LOC~~ SOSY
60.0000 mg | PREFILLED_SYRINGE | Freq: Once | SUBCUTANEOUS | Status: AC
  Administered 2020-10-28: 60 mg via SUBCUTANEOUS

## 2020-10-28 NOTE — Progress Notes (Unsigned)
Subjective:    Patient ID: Ariel Medina, female    DOB: Nov 19, 1927, 87 y.o.   MRN: [id number]  DOS:  10/28/2020 Type of visit - description: Routine checkup Since the last office visit he is doing okay. Has no major concerns. Has gained some weight.  Denies fever chills Breathing at baseline. Good p.o. tolerance, no nausea vomiting.  No abdominal pain. No cough  Wt Readings from Last 3 Encounters:  10/28/20 119 lb (54 kg)  09/02/20 115 lb (52.2 kg)  07/09/20 117 lb 9.6 oz (53.3 kg)   BP Readings from Last 3 Encounters:  10/28/20 (!) 142/80  07/09/20 (!) 145/94  07/09/20 (!) 145/94     Review of Systems See above   Past Medical History:  Diagnosis Date  . Anemia   . Breast CA ([place])    left breast-invasive ductal ca stage 1-stopped arimidex 2008  . Diverticulitis of colon   . GI bleed 04/2020  . Hiatal hernia   . Hyperlipidemia   . Hypertension   . Hypothyroidism   . Osteoarthritis   . Osteopenia    DEXA 7/02  . Syncope 2005   CT head (-), ECHO essent. neg, stress test (-), carotid u/s (-)    Past Surgical History:  Procedure Laterality Date  . BREAST BIOPSY Bilateral   . BREAST LUMPECTOMY  04/2002   left  . PARTIAL COLECTOMY N/A 05/01/2020   Procedure: PARTIAL COLECTOMY;  Surgeon: Coralie Keens, MD;  Location: [facility];  Service: General;  Laterality: N/A;  . TONSILLECTOMY AND ADENOIDECTOMY      Allergies as of 10/28/2020      Reactions   Ibuprofen    REACTION: bleed      Medication List       Accurate as of October 28, 2020 11:59 PM. If you have any questions, ask your nurse or doctor.        STOP taking these medications   rosuvastatin 5 MG tablet Commonly known as: CRESTOR Stopped by: Kathlene November, MD     TAKE these medications   apixaban 5 MG Tabs tablet Commonly known as: ELIQUIS Take 1 tablet (5 mg total) by mouth 2 (two) times daily.   cyclobenzaprine 5 MG tablet Commonly known as: FLEXERIL Take 1 tablet (5 mg total) by mouth 2  (two) times daily as needed for muscle spasms.   feeding supplement Liqd Take 237 mLs by mouth 2 (two) times daily between meals.   fluticasone 50 MCG/ACT nasal spray Commonly known as: FLONASE Place 2 sprays into both nostrils daily as needed for allergies or rhinitis.   hydrochlorothiazide 25 MG tablet Commonly known as: HYDRODIURIL Take 1 tablet (25 mg total) by mouth daily.   levothyroxine 75 MCG tablet Commonly known as: SYNTHROID Take 1 tablet (75 mcg total) by mouth daily before breakfast.   potassium chloride 10 MEQ tablet Commonly known as: KLOR-CON Take 1 tablet (10 mEq total) by mouth daily.          Objective:   Physical Exam BP (!) 142/80 (BP Location: Right Arm, Patient Position: Sitting, Cuff Size: Small)   Pulse 82   Temp 98 F (36.7 C) (Oral)   Resp 20   Ht 5\' 3"  (1.6 m)   Wt 119 lb (54 kg)   SpO2 97%   BMI 21.08 kg/m  General:   Well developed, NAD, BMI noted. HEENT:  Normocephalic . Face symmetric, atraumatic Lungs:  CTA B Normal respiratory effort, no intercostal retractions, no accessory muscle  use. Heart: RRR,  no murmur.  Lower extremities: Right lower extremity larger than the left, not a new finding. Skin: Not pale. Not jaundice Neurologic:  alert & oriented X3.  Speech normal, gait assisted by a cane, low clearance when she walks noted. Psych--  Cognition and judgment appear intact.  Cooperative with normal attention span and concentration.  Behavior appropriate. No anxious or depressed appearing.      Assessment      Assessment  HTN Hyperlipidemia Hypothyroidism Osteopenia:  --T score -2.1 (04-2012) DC Fosamax 12/2013 after 5 years --T score -1.5   (02-2015)  --T score -3.6 (april 2019) on Prolia DJD GERD  Tremors, likely essential H/o diverticulitis Breast cancer, L breast, release from oncology 2013 Syncope 2005 11-2016: Fall, FX L pubic ramus, admitted then d/c to a NH, back home 12-18-16 Edema, R>L U/S (-)  for DVT  01/2017  PLAN: HTN: BP in the 140s, no change, continue HCTZ, check BMP and CBC. Anemia: Per chart review, checking a CBC. Pulmonary nodule: Had empiric XRT.  To see pulmonary soon. DVT, bilateral PE 04-2020: On Eliquis, sometimes forget the second dose, encourage good compliance.  At some point might benefit from switching to Xarelto for qd dosing . We will see pulmonary soon. Osteoporosis: Prolia today Hyperlipidemia: Taking Crestor sporadically, at this point we agreed to stop Crestor, she likes to take a minimal amount of medication possible. Thyroid disease: Check TSH Gait disorder: Gait is limited by multiple factors, she shows appropriate use of cane. RTC 4 to 5 months.   This visit occurred during the SARS-CoV-2 public health emergency.  Safety protocols were in place, including screening questions prior to the visit, additional usage of staff PPE, and extensive cleaning of exam room while observing appropriate contact time as indicated for disinfecting solutions.

## 2020-10-28 NOTE — Patient Instructions (Addendum)
Stop rosuvastatin, the medication for cholesterol that you used to take 3 times a week  Take the other medications as recommended  GO TO THE LAB : Get the blood work     [place], [place] back for   a checkup in 4 to 5 months

## 2020-10-29 ENCOUNTER — Encounter: Payer: Self-pay | Admitting: Podiatry

## 2020-10-29 ENCOUNTER — Ambulatory Visit (INDEPENDENT_AMBULATORY_CARE_PROVIDER_SITE_OTHER): Payer: Medicare HMO | Admitting: Podiatry

## 2020-10-29 DIAGNOSIS — B351 Tinea unguium: Secondary | ICD-10-CM

## 2020-10-29 DIAGNOSIS — M79676 Pain in unspecified toe(s): Secondary | ICD-10-CM

## 2020-10-29 DIAGNOSIS — Q828 Other specified congenital malformations of skin: Secondary | ICD-10-CM

## 2020-10-29 NOTE — Progress Notes (Signed)
This patient returns to the office for evaluation and treatment of long thick painful nails .  This patient is unable to trim her own nails since the patient cannot reach the feet.  Patient says the nails are painful walking and wearing her shoes.  She has painful callus on the outside of her right foot.    She  returns for preventive foot care services.   General Appearance  Alert, conversant and in no acute stress.  Vascular  Dorsalis pedis and posterior tibial  pulses are weakly  palpable  bilaterally.  Capillary return is within normal limits  bilaterally. Temperature is within normal limits  bilaterally.  Neurologic  Senn-Weinstein monofilament wire test within normal limits  bilaterally. Muscle power within normal limits bilaterally.  Nails Thick disfigured discolored nails with subungual debris  from hallux to fifth toes bilaterally. No evidence of bacterial infection or drainage bilaterally.  Orthopedic  No limitations of motion  feet .  No crepitus or effusions noted.  No bony pathology or digital deformities noted. HAV  B/L  Skin  normotropic skin  noted bilaterally.  No signs of infections or ulcers noted.   Porokeratosis sub 5th met right foot.  Callus secondary HAV  B/L.  Onychomycosis  Pain in toes right foot  Pain in toes left foot  Porokeratosis sub 5th met right foot.  Debridement  of nails  1-5  B/L with a nail nipper.  Nails were then filed using a dremel tool with no incidents.  Debride porokeratosis with dremel tool     RTC 12 weeks   Gardiner Barefoot DPM

## 2020-10-29 NOTE — Assessment & Plan Note (Signed)
HTN: BP in the 140s, no change, continue HCTZ, check BMP and CBC. Anemia: Per chart review, checking a CBC. Pulmonary nodule: Had empiric XRT.  To see pulmonary soon. DVT, bilateral PE 04-2020: On Eliquis, sometimes forget the second dose, encourage good compliance.  At some point might benefit from switching to Xarelto for qd dosing . We will see pulmonary soon. Osteoporosis: Prolia today Hyperlipidemia: Taking Crestor sporadically, at this point we agreed to stop Crestor, she likes to take a minimal amount of medication possible. Thyroid disease: Check TSH Gait disorder: Gait is limited by multiple factors, she shows appropriate use of cane. RTC 4 to 5 months.

## 2020-11-03 ENCOUNTER — Other Ambulatory Visit: Payer: Self-pay

## 2020-11-03 ENCOUNTER — Ambulatory Visit (HOSPITAL_BASED_OUTPATIENT_CLINIC_OR_DEPARTMENT_OTHER)
Admission: RE | Admit: 2020-11-03 | Discharge: 2020-11-03 | Disposition: A | Discharge status: Home / Self Care | Payer: Medicare HMO | Source: Ambulatory Visit | Attending: Pulmonary Disease | Admitting: Pulmonary Disease

## 2020-11-03 DIAGNOSIS — I2510 Atherosclerotic heart disease of native coronary artery without angina pectoris: Secondary | ICD-10-CM | POA: Diagnosis not present

## 2020-11-03 DIAGNOSIS — J984 Other disorders of lung: Secondary | ICD-10-CM | POA: Diagnosis not present

## 2020-11-03 DIAGNOSIS — J90 Pleural effusion, not elsewhere classified: Secondary | ICD-10-CM | POA: Diagnosis not present

## 2020-11-03 DIAGNOSIS — R918 Other nonspecific abnormal finding of lung field: Secondary | ICD-10-CM | POA: Diagnosis not present

## 2020-11-03 DIAGNOSIS — I708 Atherosclerosis of other arteries: Secondary | ICD-10-CM | POA: Diagnosis not present

## 2020-11-05 ENCOUNTER — Encounter: Payer: Self-pay | Admitting: Pulmonary Disease

## 2020-11-05 ENCOUNTER — Ambulatory Visit (INDEPENDENT_AMBULATORY_CARE_PROVIDER_SITE_OTHER): Payer: Medicare HMO | Admitting: Pulmonary Disease

## 2020-11-05 ENCOUNTER — Other Ambulatory Visit: Payer: Self-pay

## 2020-11-05 ENCOUNTER — Telehealth: Payer: Self-pay | Admitting: Pharmacist

## 2020-11-05 VITALS — BP 118/72 | HR 73 | Temp 97.8°F | Ht 63.0 in | Wt 119.2 lb

## 2020-11-05 DIAGNOSIS — Z7901 Long term (current) use of anticoagulants: Secondary | ICD-10-CM

## 2020-11-05 DIAGNOSIS — R918 Other nonspecific abnormal finding of lung field: Secondary | ICD-10-CM

## 2020-11-05 DIAGNOSIS — I2699 Other pulmonary embolism without acute cor pulmonale: Secondary | ICD-10-CM

## 2020-11-05 DIAGNOSIS — R911 Solitary pulmonary nodule: Secondary | ICD-10-CM | POA: Diagnosis not present

## 2020-11-05 DIAGNOSIS — I8290 Acute embolism and thrombosis of unspecified vein: Secondary | ICD-10-CM | POA: Diagnosis not present

## 2020-11-05 NOTE — Progress Notes (Signed)
Synopsis: Referred in November 2021 for lung nodule by Ariel Branch, MD  Subjective:   PATIENT ID: Ariel Medina GENDER: female DOB: 09-26-1927, MRN: [id number]  Chief Complaint  Patient presents with  . Follow-up    Recently had CT scan done on 03/28.  She has been doing well.      This is a 87 year old female, past medical history of breast cancer, stage I several years ago, history of GI bleeding, hyperlipidemia, hypertension.  Patient had a recent hospitalization for intussusception.  Patient found to have a Ariel lipoma causing intussusception.  She was taken to the operating room for partial colectomy and anastomosis.  She had bilateral pulmonary emboli pulmonary was consulted.  Patient was trialed on anticoagulation during the hospitalization.  Patient was discharged from the hospital on 05/06/2020 discharge summary was reviewed by Dr. Wynelle Medina.  In addition she did have bilateral lower extremity DVT.  CT scan of the chest on admission revealed a 16 x 11 mm irregular solid density within the left upper lobe.  Ultimately the patient had hospital follow-up in our clinic at discharge with Ariel Quaker, NP.  Recommended continued Eliquis for DVT/PE.  Planned noncontrasted CT chest follow-up to ensure resolution of lung nodule.  She had this repeat noncontrasted CT of the chest on 06/02/2020.  There was persistence of this opacity in the left lung apex measuring 0.9 x 2 cm in size.  OV 06/26/2020: Patient here today for follow-up regarding recent PET scan imaging of abnormal lung nodule.  This was completed and read yesterday.  Looking at the left upper lobe anterior pulmonary nodule is hypermetabolic with a SUV of 4.6.  Additionally there is a small AP window node that metabolic activity is present does not exclude metastatic disease.  Today we reviewed patient's patient's imaging as above.  Otherwise respiratory standpoint she has no complaints.  OV 11/05/2020: Today for follow-up after referral to  radiation oncology for empiric SBRT.  Patient was seen by radiation oncology on 07/09/2020, office note reviewed.  Patient had a PET avid left upper lobe nodule that discussed indications for treatment of early-stage slow-growing non-small cell lung cancer.  However due to the patient's age felt to be a good candidate for SBRT without undergoing tissue biopsy.  Patient received received 3 sessions of SBRT.  Primary care office visit on 10/28/2020.  Office note reviewed.  Patient had CT scan of the chest on 11/03/2020 this revealed regression of her upper lobe nodule status post SBRT however the upper lobe consolidated mass along the anterior wall of the left upper lobe has increased in size and could represent malignancy. Here previously noted left upper lobe nodule has partially regressed.  However there is any increasing opacity within the left upper lobe anterior portion.  When compared to prior CT imaging.  This also could represent malignancy.  We discussed this today in the office.  I will reach out to her primary radiation oncologist to discuss as well.    Past Medical History:  Diagnosis Date  . Anemia   . Breast CA ([place])    left breast-invasive ductal ca stage 1-stopped arimidex 2008  . Diverticulitis of Ariel   . GI bleed 04/2020  . Hiatal hernia   . Hyperlipidemia   . Hypertension   . Hypothyroidism   . Osteoarthritis   . Osteopenia    DEXA 7/02  . Syncope 2005   CT head (-), ECHO essent. neg, stress test (-), carotid u/s (-)  Family History  Problem Relation Age of Onset  . Heart attack Brother 14  . Diabetes Mother   . Stroke Father 82  . Stroke Sister 37  . Ariel cancer Neg Hx   . Breast cancer Neg Hx      Past Surgical History:  Procedure Laterality Date  . BREAST BIOPSY Bilateral   . BREAST LUMPECTOMY  04/2002   left  . PARTIAL COLECTOMY N/A 05/01/2020   Procedure: PARTIAL COLECTOMY;  Surgeon: Ariel Keens, MD;  Location: [facility];  Service: General;  Laterality:  N/A;  . TONSILLECTOMY AND ADENOIDECTOMY      Social History   Socioeconomic History  . Marital status: Widowed    Spouse name: Not on file  . Number of children: 2  . Years of education: Not on file  . Highest education level: Not on file  Occupational History  . Occupation: n/a    Employer: RETIRED  Tobacco Use  . Smoking status: Never Smoker  . Smokeless tobacco: Never Used  Vaping Use  . Vaping Use: Never used  Substance and Sexual Activity  . Alcohol use: No  . Drug use: No  . Sexual activity: Not Currently  Other Topics Concern  . Not on file  Social History Narrative   Lost a son 89   G-son  live w/ her from time to time, not permanently   G-daughter Ariel Medina lives 15 min away    Sister live next door , does help the pt    G-son @ [employer] , G-daughter @ [employer] since 1993   still drives, short distances, typically not at night.   Social Determinants of Health   Financial Resource Strain: Low Risk   . Difficulty of Paying Living Expenses: Not hard at all  Food Insecurity: No Food Insecurity  . Worried About Charity fundraiser in the Last Year: Never true  . Ran Out of Food in the Last Year: Never true  Transportation Needs: No Transportation Needs  . Lack of Transportation (Medical): No  . Lack of Transportation (Non-Medical): No  Physical Activity: Not on file  Stress: No Stress Concern Present  . Feeling of Stress : Only a little  Social Connections: Not on file  Intimate Partner Violence: Not on file     Allergies  Allergen Reactions  . Ibuprofen     REACTION: bleed     Outpatient Medications Prior to Visit  Medication Sig Dispense Refill  . apixaban (ELIQUIS) 5 MG TABS tablet Take 1 tablet (5 mg total) by mouth 2 (two) times daily. 180 tablet 3  . cyclobenzaprine (FLEXERIL) 5 MG tablet Take 1 tablet (5 mg total) by mouth 2 (two) times daily as needed for muscle spasms. 20 tablet 0  . feeding supplement, ENSURE ENLIVE, (ENSURE  ENLIVE) LIQD Take 237 mLs by mouth 2 (two) times daily between meals. 237 mL 12  . fluticasone (FLONASE) 50 MCG/ACT nasal spray Place 2 sprays into both nostrils daily as needed for allergies or rhinitis. 48 g 0  . hydrochlorothiazide (HYDRODIURIL) 25 MG tablet Take 1 tablet (25 mg total) by mouth daily. 90 tablet 1  . levothyroxine (SYNTHROID) 75 MCG tablet Take 1 tablet (75 mcg total) by mouth daily before breakfast. 90 tablet 0  . potassium chloride (KLOR-CON) 10 MEQ tablet Take 1 tablet (10 mEq total) by mouth daily. 90 tablet 1   No facility-administered medications prior to visit.    Review of Systems  Constitutional: Negative for  chills, fever, malaise/fatigue and weight loss.  HENT: Negative for hearing loss, sore throat and tinnitus.   Eyes: Negative for blurred vision and double vision.  Respiratory: Negative for cough, hemoptysis, sputum production, shortness of breath, wheezing and stridor.   Cardiovascular: Negative for chest pain, palpitations, orthopnea, leg swelling and PND.  Gastrointestinal: Negative for abdominal pain, constipation, diarrhea, heartburn, nausea and vomiting.  Genitourinary: Negative for dysuria, hematuria and urgency.  Musculoskeletal: Negative for joint pain and myalgias.  Skin: Negative for itching and rash.  Neurological: Negative for dizziness, tingling, weakness and headaches.  Endo/Heme/Allergies: Negative for environmental allergies. Does not bruise/bleed easily.  Psychiatric/Behavioral: Negative for depression. The patient is not nervous/anxious and does not have insomnia.   All other systems reviewed and are negative.    Objective:  Physical Exam Vitals reviewed.  Constitutional:      General: She is not in acute distress.    Appearance: She is well-developed.     Comments: Thin, elderly  HENT:     Head: Normocephalic and atraumatic.  Eyes:     General: No scleral icterus.    Conjunctiva/sclera: Conjunctivae normal.     Pupils: Pupils  are equal, round, and reactive to light.  Neck:     Vascular: No JVD.     Trachea: No tracheal deviation.  Cardiovascular:     Rate and Rhythm: Normal rate and regular rhythm.     Heart sounds: Normal heart sounds. No murmur heard.   Pulmonary:     Effort: Pulmonary effort is normal. No tachypnea, accessory muscle usage or respiratory distress.     Breath sounds: Normal breath sounds. No stridor. No wheezing, rhonchi or rales.  Abdominal:     General: Bowel sounds are normal. There is no distension.     Palpations: Abdomen is soft.     Tenderness: There is no abdominal tenderness.  Musculoskeletal:        General: No tenderness.     Cervical back: Neck supple.  Lymphadenopathy:     Cervical: No cervical adenopathy.  Skin:    General: Skin is warm and dry.     Capillary Refill: Capillary refill takes less than 2 seconds.     Findings: No rash.  Neurological:     Mental Status: She is alert and oriented to person, place, and time.  Psychiatric:        Behavior: Behavior normal.      Vitals:   11/05/20 1004  BP: 118/72  Pulse: 73  Temp: 97.8 F (36.6 C)  TempSrc: Tympanic  SpO2: 98%  Weight: 119 lb 4 oz (54.1 kg)  Height: 5\' 3"  (1.6 m)   98% on RA BMI Readings from Last 3 Encounters:  11/05/20 21.12 kg/m  10/28/20 21.08 kg/m  09/02/20 20.37 kg/m   Wt Readings from Last 3 Encounters:  11/05/20 119 lb 4 oz (54.1 kg)  10/28/20 119 lb (54 kg)  09/02/20 115 lb (52.2 kg)     CBC    Component Value Date/Time   WBC 5.2 10/28/2020 1017   RBC 4.61 10/28/2020 1017   HGB 12.1 10/28/2020 1017   HGB 13.0 11/11/2011 1013   HGB 14.4 10/05/2007 1009   HCT 36.9 10/28/2020 1017   HCT 39.3 11/11/2011 1013   HCT 41.5 10/05/2007 1009   PLT 256.0 10/28/2020 1017   PLT 280 11/11/2011 1013   PLT 270 10/05/2007 1009   MCV 80.1 10/28/2020 1017   MCV 87 11/11/2011 1013   MCV 92.2 10/05/2007 1009  MCH 29.4 06/02/2020 1215   MCHC 32.8 10/28/2020 1017   RDW 19.0 (H)  10/28/2020 1017   RDW 14.1 11/11/2011 1013   RDW 13.3 10/05/2007 1009   LYMPHSABS 1.1 10/28/2020 1017   LYMPHSABS 1.2 11/11/2011 1013   LYMPHSABS 1.5 10/05/2007 1009   MONOABS 0.6 10/28/2020 1017   MONOABS 0.6 10/05/2007 1009   EOSABS 0.1 10/28/2020 1017   EOSABS 0.1 11/11/2011 1013   BASOSABS 0.0 10/28/2020 1017   BASOSABS 0.0 11/11/2011 1013   BASOSABS 0.0 10/05/2007 1009     Chest Imaging: 06/02/2020 CT chest: Persistent 0.9 x 2 cm spiculated nodule of the left upper lobe.  Some peripheral interstitial changes concerning for fibrosis. The patient's images have been independently reviewed by me.   November 2021 nuclear medicine pet image: Spiculated left upper lobe lesion concerning for primary bronchogenic carcinoma with PET avid uptake.  11/03/2020 CT chest: Enlarging left upper lobe peripheral consolidation/infiltrate, regression of small nodule in the left upper lobe status post SBRT. The patient's images have been independently reviewed by me.    Pulmonary Functions Testing Results: No flowsheet data found.  FeNO:   Pathology:   Echocardiogram:   1. Septal motion is abnormal likely due to conduction abnormality. . Left  ventricular ejection fraction, by estimation, is 60 to 65%. The left  ventricle has normal function. The left ventricle has no regional wall  motion abnormalities. There is mild  concentric left ventricular hypertrophy. Left ventricular diastolic  parameters are consistent with Grade I diastolic dysfunction (impaired  relaxation).  2. Right ventricular systolic function is normal. The right ventricular  size is normal. There is mildly elevated pulmonary artery systolic  pressure.  3. The mitral valve is normal in structure. Trivial mitral valve  regurgitation.  4. The aortic valve is tricuspid. There is mild calcification of the  aortic valve. There is mild thickening of the aortic valve. Aortic valve  regurgitation is trivial. Mild aortic  valve sclerosis is present, with no  evidence of aortic valve stenosis.  5. The inferior vena cava is normal in size with greater than 50%  respiratory variability, suggesting right atrial pressure of 3 mmHg.   Heart Catheterization:     Assessment & Plan:     ICD-10-CM   1. Nodule of upper lobe of left lung  R91.09 July 2020, presumed malignancy, PET SUV 4.6 Empiric SBRT X3 sessions   2. Abnormal CT scan of lung  R91.8   3. Pulmonary embolism, bilateral (HCC)  I26.99   4. On continuous oral anticoagulation  Z79.01   5. VTE (venous thromboembolism)  I82.90     Discussion: This is a 87 year old female recently hospitalized for bilateral PE, intussusception secondary to a Ariel lipoma status post colectomy with primary anastomosis.  Patient had follow-up regarding a left upper lobe pulmonary nodule which revealed hypermetabolism.  There is also a small focus within the AP window.  After discussion of next steps regarding patient's age and potential biopsy and treatment plans decision was made for empiric SBRT.  Plan: Patient underwent SBRT x3 to a lesion with no biopsy. Her risk was due to her age and recent surgery as well as anticoagulation for PE. Subsequent follow-up imaging that was completed this past week revealed a enlargement of the masslike consolidation on the anterior wall of the left upper lobe.  Additionally the small nodule showed regression status post SBRT. The larger area within the left upper lobe I am unsure if this represents progressive  malignancy or just post radiation changes.  I will discuss with her primary radiation oncologist about next steps.  Whether or not we should consider additional radiation or not.  The patient is not interested in being too aggressive.  Patient can continue anticoagulation with history of PE and malignancy. Patient to follow-up with [place] in 6 months. We will discuss need for short-term imaging follow-up with radiation oncology  before making plans.   Current Outpatient Medications:  .  apixaban (ELIQUIS) 5 MG TABS tablet, Take 1 tablet (5 mg total) by mouth 2 (two) times daily., Disp: 180 tablet, Rfl: 3 .  cyclobenzaprine (FLEXERIL) 5 MG tablet, Take 1 tablet (5 mg total) by mouth 2 (two) times daily as needed for muscle spasms., Disp: 20 tablet, Rfl: 0 .  feeding supplement, ENSURE ENLIVE, (ENSURE ENLIVE) LIQD, Take 237 mLs by mouth 2 (two) times daily between meals., Disp: 237 mL, Rfl: 12 .  fluticasone (FLONASE) 50 MCG/ACT nasal spray, Place 2 sprays into both nostrils daily as needed for allergies or rhinitis., Disp: 48 g, Rfl: 0 .  hydrochlorothiazide (HYDRODIURIL) 25 MG tablet, Take 1 tablet (25 mg total) by mouth daily., Disp: 90 tablet, Rfl: 1 .  levothyroxine (SYNTHROID) 75 MCG tablet, Take 1 tablet (75 mcg total) by mouth daily before breakfast., Disp: 90 tablet, Rfl: 0 .  potassium chloride (KLOR-CON) 10 MEQ tablet, Take 1 tablet (10 mEq total) by mouth daily., Disp: 90 tablet, Rfl: 1   Garner Nash, DO [facility] 11/05/2020 10:25 AM

## 2020-11-05 NOTE — Patient Instructions (Addendum)
Thank you for visiting Dr. Valeta Harms at [facility]. Today we recommend the following:  We will contact Dr. Sondra Come about your Ct results   Return in about 6 months (around 05/08/2021), or if symptoms worsen or fail to improve.    Please do your part to reduce the spread of COVID-19.

## 2020-11-05 NOTE — Progress Notes (Addendum)
    Chronic Care Management Pharmacy Assistant   Name: Ariel Medina  MRN: [id number] DOB: Oct 09, 1927  Reason for Encounter: Adherence Review  I reviewed the patients chart for any medical/health changes and/or medication changes, The patient has STOPPED Rosuvastatin 5 mg on 10/28/20 from a visit with Dr. [name].     Follow-Up:Pharmacist Review  Charlann Lange, [place] Pharmacist Assistant [phone]

## 2020-11-13 ENCOUNTER — Other Ambulatory Visit: Payer: Self-pay | Admitting: Radiation Oncology

## 2020-11-13 DIAGNOSIS — C3412 Malignant neoplasm of upper lobe, left bronchus or lung: Secondary | ICD-10-CM

## 2020-11-13 NOTE — Progress Notes (Signed)
Diagnosis: PET avid left upper lobe pulmonary nodule  This afternoon I reviewed the patient's posttreatment CT scan.  The lesion that was treated has shown some shrinkage consistent with her SBRT treatments.  She has some changes in the surrounding area and in other parts of the left lung.  Unsure what these changes are so we will order another chest CT scan in 3 months and then see the patient afterward.  Depending on results of this chest CT scan we may possibly order another PET scan for further evaluation.  Unsure why the patient did not follow-up in 1 month after completion of her SBRT treatments.  She completed back in late December 2021.  She received 72 Gray in 3 fractions to the left upper lung nodule.  Blair Promise, M.D.

## 2020-11-13 NOTE — Progress Notes (Incomplete)
  Patient Name: Ariel Medina MRN: [id number] DOB: 19-Sep-1927 Referring Physician: June Leap Date of Service: 08/05/2020 [facility], [place]                                                        End Of Treatment Note  Diagnoses: R91.1-Solitary pulmonary nodule R91.8-Other nonspecific abnormal finding of lung field  Cancer Staging: PET avid left upper lobe pulmonary nodule  Intent: Curative  Radiation Treatment Dates: 07/29/2020 through 08/05/2020  Site: Left lung Technique: IMRT Total Dose (Gy): 54/54 Dose per Fx (Gy): 18 Completed Fx: 3/3 Beam Energies: 6XFFF  Narrative: The patient tolerated radiation therapy relatively well. She did report mild fatigue and shortness of breath both with activity and while at rest. Appetite remained fair. She denied cough, difficulty swallowing, pain, and skin changes.  Plan: The patient will follow-up with radiation oncology in one month.  ________________________________________________   Blair Promise, PhD, MD  This document serves as a record of services personally performed by Gery Pray, MD. It was created on his behalf by Clerance Lav, a trained medical scribe. The creation of this record is based on the scribe's personal observations and the provider's statements to them. This document has been checked and approved by the attending provider.

## 2020-12-03 ENCOUNTER — Other Ambulatory Visit: Payer: Self-pay | Admitting: Internal Medicine

## 2020-12-29 DIAGNOSIS — H5213 Myopia, bilateral: Secondary | ICD-10-CM | POA: Diagnosis not present

## 2021-01-14 ENCOUNTER — Telehealth (INDEPENDENT_AMBULATORY_CARE_PROVIDER_SITE_OTHER): Payer: Medicare HMO | Admitting: Family

## 2021-01-14 ENCOUNTER — Other Ambulatory Visit: Payer: Self-pay

## 2021-01-14 DIAGNOSIS — J40 Bronchitis, not specified as acute or chronic: Secondary | ICD-10-CM | POA: Diagnosis not present

## 2021-01-14 MED ORDER — AZITHROMYCIN 250 MG PO TABS: ORAL_TABLET | ORAL | 0 refills | Status: AC

## 2021-01-14 MED ORDER — BENZONATATE 100 MG PO CAPS: 100.0000 mg | ORAL_CAPSULE | Freq: Three times a day (TID) | ORAL | 0 refills | Status: DC | PRN

## 2021-01-14 NOTE — Progress Notes (Signed)
Virtual telephone visit    Virtual Visit via Telephone Note   This visit type was conducted due to national recommendations for restrictions regarding the COVID-19 Pandemic (e.g. social distancing) in an effort to limit this patient's exposure and mitigate transmission in our community. Due to her co-morbid illnesses, this patient is at least at moderate risk for complications without adequate follow up. This format is felt to be most appropriate for this patient at this time. The patient did not have access to video technology or had technical difficulties with video requiring transitioning to audio format only (telephone). Physical exam was limited to content and character of the telephone converstion. CMA was able to get the patient set up on a telephone visit.   Patient location: home Patient and provider in visit Provider location: Office  I discussed the limitations of evaluation and management by telemedicine and the availability of in person appointments. The patient expressed understanding and agreed to proceed.   Visit Date: 01/14/2021  Today's healthcare provider: Nance Pear, NP     Subjective:    Patient ID: Ariel Medina, female    DOB: 11/27/1927, 87 y.o.   MRN: [id number]  Chief Complaint  Patient presents with  . Cough    Persistent cough    HPI Patient is in today for a telephone visit.   Pt reports that she woke up last Friday with HA/runny nose.  Lasted several days.  Now she reports + cough. She denies fever.  She can taste/smell.  Grandson who lives with her has been seemingly healthy. She has had 3 pfizer covid vaccines. Denies fever.  Has SOB but it is at her baseline.   Past Medical History:  Diagnosis Date  . Anemia   . Breast CA ([place])    left breast-invasive ductal ca stage 1-stopped arimidex 2008  . Diverticulitis of colon   . GI bleed 04/2020  . Hiatal hernia   . Hyperlipidemia   . Hypertension   . Hypothyroidism   .  Osteoarthritis   . Osteopenia    DEXA 7/02  . Syncope 2005   CT head (-), ECHO essent. neg, stress test (-), carotid u/s (-)    Past Surgical History:  Procedure Laterality Date  . BREAST BIOPSY Bilateral   . BREAST LUMPECTOMY  04/2002   left  . PARTIAL COLECTOMY N/A 05/01/2020   Procedure: PARTIAL COLECTOMY;  Surgeon: Coralie Keens, MD;  Location: [facility];  Service: General;  Laterality: N/A;  . TONSILLECTOMY AND ADENOIDECTOMY      Family History  Problem Relation Age of Onset  . Heart attack Brother 26  . Diabetes Mother   . Stroke Father 72  . Stroke Sister 75  . Colon cancer Neg Hx   . Breast cancer Neg Hx     Social History   Socioeconomic History  . Marital status: Widowed    Spouse name: Not on file  . Number of children: 2  . Years of education: Not on file  . Highest education level: Not on file  Occupational History  . Occupation: n/a    Employer: RETIRED  Tobacco Use  . Smoking status: Never Smoker  . Smokeless tobacco: Never Used  Vaping Use  . Vaping Use: Never used  Substance and Sexual Activity  . Alcohol use: No  . Drug use: No  . Sexual activity: Not Currently  Other Topics Concern  . Not on file  Social History Narrative   Lost a son 35  G-son  live w/ her from time to time, not permanently   G-daughter [name] lives 15 min away    Sister live next door , does help the pt    G-son @ [employer] , G-daughter @ [employer] since 1993   still drives, short distances, typically not at night.   Social Determinants of Health   Financial Resource Strain: Low Risk   . Difficulty of Paying Living Expenses: Not hard at all  Food Insecurity: No Food Insecurity  . Worried About Charity fundraiser in the Last Year: Never true  . Ran Out of Food in the Last Year: Never true  Transportation Needs: No Transportation Needs  . Lack of Transportation (Medical): No  . Lack of Transportation (Non-Medical): No  Physical Activity: Not on file   Stress: No Stress Concern Present  . Feeling of Stress : Only a little  Social Connections: Not on file  Intimate Partner Violence: Not on file    Outpatient Medications Prior to Visit  Medication Sig Dispense Refill  . apixaban (ELIQUIS) 5 MG TABS tablet Take 1 tablet (5 mg total) by mouth 2 (two) times daily. 180 tablet 3  . cyclobenzaprine (FLEXERIL) 5 MG tablet Take 1 tablet (5 mg total) by mouth 2 (two) times daily as needed for muscle spasms. 20 tablet 0  . feeding supplement, ENSURE ENLIVE, (ENSURE ENLIVE) LIQD Take 237 mLs by mouth 2 (two) times daily between meals. 237 mL 12  . fluticasone (FLONASE) 50 MCG/ACT nasal spray Place 2 sprays into both nostrils daily as needed for allergies or rhinitis. 48 g 0  . hydrochlorothiazide (HYDRODIURIL) 25 MG tablet Take 1 tablet (25 mg total) by mouth daily. 90 tablet 1  . levothyroxine (SYNTHROID) 75 MCG tablet Take 1 tablet (75 mcg total) by mouth daily before breakfast. 90 tablet 0  . potassium chloride (KLOR-CON) 10 MEQ tablet Take 1 tablet (10 mEq total) by mouth daily. 90 tablet 1   No facility-administered medications prior to visit.    Allergies  Allergen Reactions  . Ibuprofen     REACTION: bleed    ROS    see HPI Objective:    Physical Exam Pulmonary:     Effort: No respiratory distress.  Neurological:     Mental Status: She is alert and oriented to person, place, and time.  Psychiatric:        Attention and Perception: Attention and perception normal.        Mood and Affect: Mood and affect normal.        Speech: Speech normal.        Behavior: Behavior normal.     There were no vitals taken for this visit. Wt Readings from Last 3 Encounters:  11/05/20 119 lb 4 oz (54.1 kg)  10/28/20 119 lb (54 kg)  09/02/20 115 lb (52.2 kg)    Diabetic Foot Exam - Simple   No data filed    Lab Results  Component Value Date   WBC 5.2 10/28/2020   HGB 12.1 10/28/2020   HCT 36.9 10/28/2020   PLT 256.0 10/28/2020    GLUCOSE 87 10/28/2020   CHOL 248 (H) 06/26/2019   TRIG 96.0 06/26/2019   HDL 60.60 06/26/2019   LDLDIRECT 150.2 06/07/2013   LDLCALC 169 (H) 06/26/2019   ALT 4 (L) 06/02/2020   AST 14 06/02/2020   NA 138 10/28/2020   K 3.9 10/28/2020   CL 97 10/28/2020   CREATININE 0.66 10/28/2020  BUN 15 10/28/2020   CO2 34 (H) 10/28/2020   TSH 3.47 10/28/2020   INR 1.5 (H) 05/08/2020    Lab Results  Component Value Date   TSH 3.47 10/28/2020   Lab Results  Component Value Date   WBC 5.2 10/28/2020   HGB 12.1 10/28/2020   HCT 36.9 10/28/2020   MCV 80.1 10/28/2020   PLT 256.0 10/28/2020   Lab Results  Component Value Date   NA 138 10/28/2020   K 3.9 10/28/2020   CO2 34 (H) 10/28/2020   GLUCOSE 87 10/28/2020   BUN 15 10/28/2020   CREATININE 0.66 10/28/2020   BILITOT 0.5 06/02/2020   ALKPHOS 31 (L) 05/08/2020   AST 14 06/02/2020   ALT 4 (L) 06/02/2020   PROT 6.4 06/02/2020   ALBUMIN 2.1 (L) 05/08/2020   CALCIUM 9.5 10/28/2020   ANIONGAP 6 05/12/2020   GFR 77.16 10/28/2020   Lab Results  Component Value Date   CHOL 248 (H) 06/26/2019   Lab Results  Component Value Date   HDL 60.60 06/26/2019   Lab Results  Component Value Date   LDLCALC 169 (H) 06/26/2019   Lab Results  Component Value Date   TRIG 96.0 06/26/2019   Lab Results  Component Value Date   CHOLHDL 4 06/26/2019   No results found for: HGBA1C     Assessment & Plan:   Problem List Items Addressed This Visit      Unprioritized   Bronchitis - Primary    New. I have advised her to take a home covid test and to call us if she tests positive.  In the meantime, will plan empiric rx with azithromycin 500mg  today, then 250mg  once daily for 4 more days. Will also rx with tessalon perles 100mg  po TID prn cough. She is also advised to call us if symptoms worsen or if not improved in 3-4 days.          I am having Rhonna A. Zelaya start on azithromycin and benzonatate. I am also having her maintain her  feeding supplement, fluticasone, cyclobenzaprine, levothyroxine, apixaban, hydrochlorothiazide, and potassium chloride.  Meds ordered this encounter  Medications  . azithromycin (ZITHROMAX) 250 MG tablet    Sig: Take 2 tablets on day 1, then 1 tablet daily on days 2 through 5    Dispense:  6 tablet    Refill:  0    Order Specific Question:   Supervising Provider    Answer:   Penni Homans A [[id number]]  . benzonatate (TESSALON) 100 MG capsule    Sig: Take 1 capsule (100 mg total) by mouth 3 (three) times daily as needed.    Dispense:  20 capsule    Refill:  0    Order Specific Question:   Supervising Provider    Answer:   Penni Homans A [[id number]]     I discussed the assessment and treatment plan with the patient. The patient was provided an opportunity to ask questions and all were answered. The patient agreed with the plan and demonstrated an understanding of the instructions.   The patient was advised to call back or seek an in-person evaluation if the symptoms worsen or if the condition fails to improve as anticipated.  I provided 15 minutes of non-face-to-face time during this encounter.   Nance Pear, NP [employer] [facility] [phone] (phone) [phone] (fax)  [place]

## 2021-01-14 NOTE — Assessment & Plan Note (Signed)
New. I have advised her to take a home covid test and to call us if she tests positive.  In the meantime, will plan empiric rx with azithromycin 500mg  today, then 250mg  once daily for 4 more days. Will also rx with tessalon perles 100mg  po TID prn cough. She is also advised to call us if symptoms worsen or if not improved in 3-4 days.

## 2021-01-28 ENCOUNTER — Ambulatory Visit (INDEPENDENT_AMBULATORY_CARE_PROVIDER_SITE_OTHER): Payer: Medicare HMO | Admitting: Podiatry

## 2021-01-28 ENCOUNTER — Encounter: Payer: Self-pay | Admitting: Podiatry

## 2021-01-28 ENCOUNTER — Other Ambulatory Visit: Payer: Self-pay

## 2021-01-28 DIAGNOSIS — M79676 Pain in unspecified toe(s): Secondary | ICD-10-CM

## 2021-01-28 DIAGNOSIS — B351 Tinea unguium: Secondary | ICD-10-CM | POA: Diagnosis not present

## 2021-01-28 DIAGNOSIS — L84 Corns and callosities: Secondary | ICD-10-CM

## 2021-01-28 DIAGNOSIS — Q828 Other specified congenital malformations of skin: Secondary | ICD-10-CM | POA: Diagnosis not present

## 2021-01-28 NOTE — Progress Notes (Signed)
This patient returns to the office for evaluation and treatment of long thick painful nails .  This patient is unable to trim her own nails since the patient cannot reach the feet.  Patient says the nails are painful walking and wearing her shoes.  She has painful callus on the outside of her right foot.    She  returns for preventive foot care services.   General Appearance  Alert, conversant and in no acute stress.  Vascular  Dorsalis pedis and posterior tibial  pulses are weakly  palpable  bilaterally.  Capillary return is within normal limits  bilaterally. Temperature is within normal limits  bilaterally.  Neurologic  Senn-Weinstein monofilament wire test within normal limits  bilaterally. Muscle power within normal limits bilaterally.  Nails Thick disfigured discolored nails with subungual debris  from hallux to fifth toes bilaterally. No evidence of bacterial infection or drainage bilaterally.  Orthopedic  No limitations of motion  feet .  No crepitus or effusions noted.  No bony pathology or digital deformities noted. HAV  B/L  Skin  normotropic skin  noted bilaterally.  No signs of infections or ulcers noted.   Porokeratosis sub 5th met right foot.  Callus secondary HAV  B/L.  Onychomycosis  Pain in toes right foot  Pain in toes left foot  Porokeratosis sub 5th met right foot.  Debridement  of nails  1-5  B/L with a nail nipper.  Nails were then filed using a dremel tool with no incidents.  Debride porokeratosis with dremel tool     RTC 12 weeks   Gardiner Barefoot DPM

## 2021-01-29 ENCOUNTER — Other Ambulatory Visit: Payer: Self-pay | Admitting: Internal Medicine

## 2021-01-29 DIAGNOSIS — E039 Hypothyroidism, unspecified: Secondary | ICD-10-CM

## 2021-02-08 IMAGING — CT CT CHEST W/O CM
2 of 3 series · 14 of 36 positions shown, 17 images · non-contrast
Comparison: CT angiography 04/30/2019 oral

CLINICAL DATA: Follow-up lung nodule seen comparison

EXAM:
CT CHEST WITHOUT CONTRAST
TECHNIQUE: Multidetector CT imaging of the chest was performed following the
standard protocol without IV contrast.

[Series 2: thorax · axial · 0.66mm/px · z∈[+912,+1152]mm · 11 of 142 slices shown, 14 images]
[im 11/142  mediastinal]
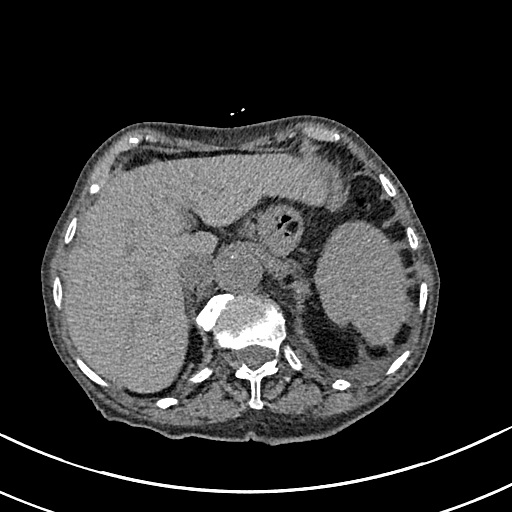
[im 11/142  lung]
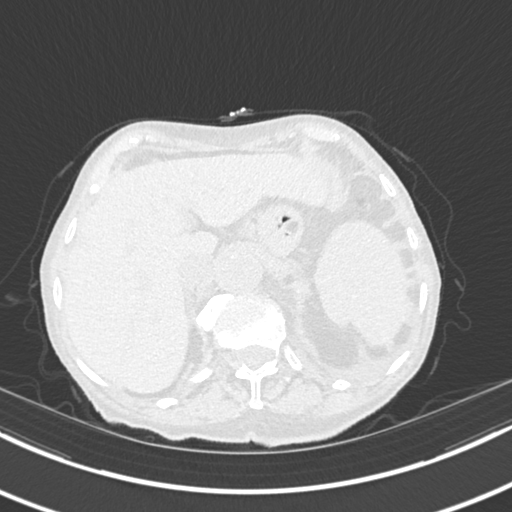
[im 21/142  lung]
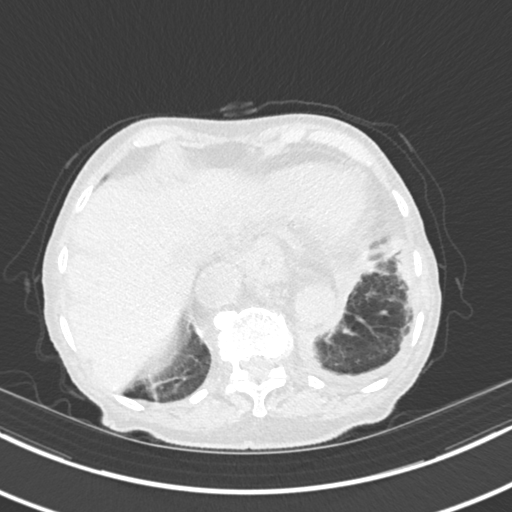
[im 32/142  lung]
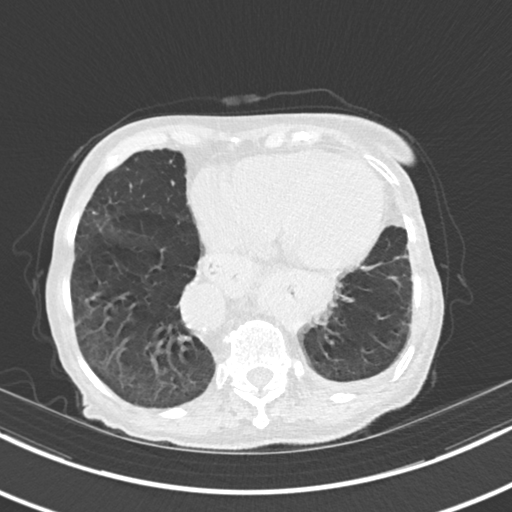
[im 48/142  lung]
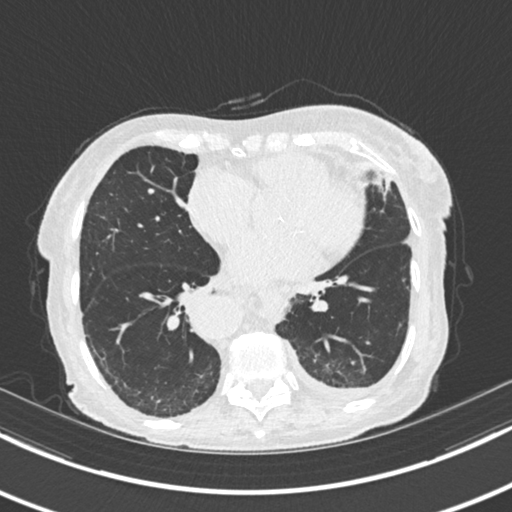
[im 58/142  mediastinal]
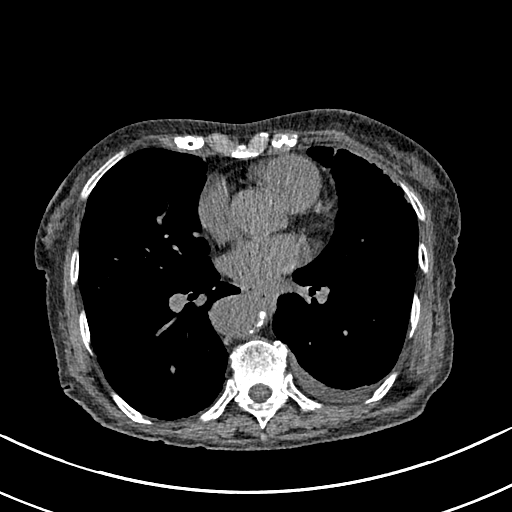
[im 58/142  lung]
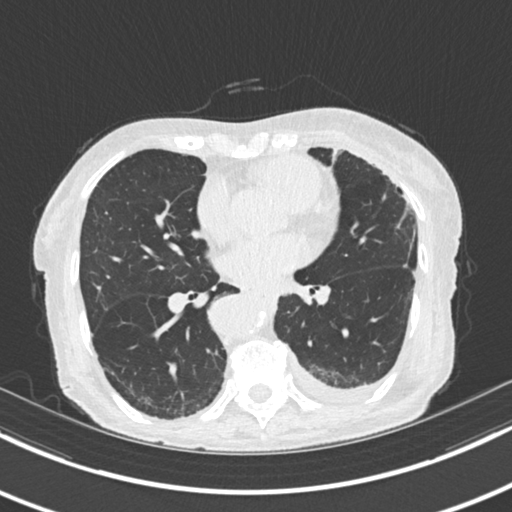
[im 74/142  lung]
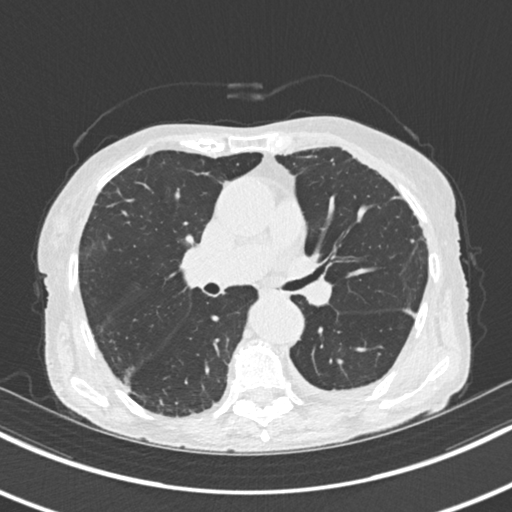
[im 84/142  lung]
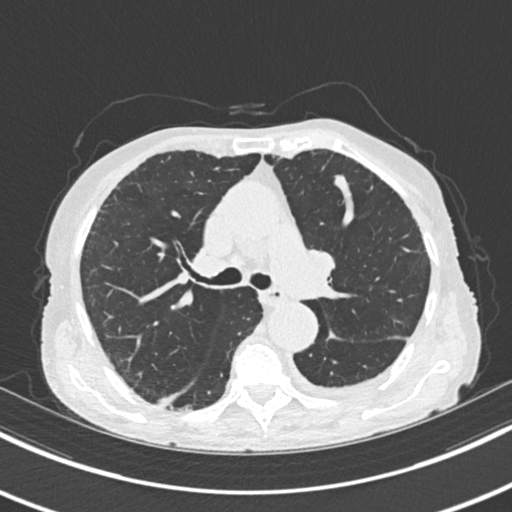
[im 95/142  lung]
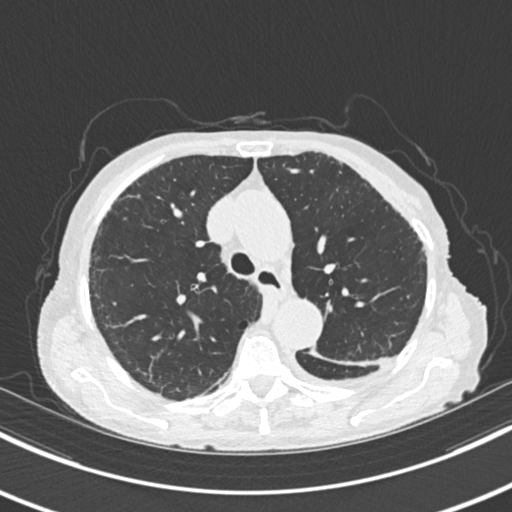
[im 110/142  mediastinal]
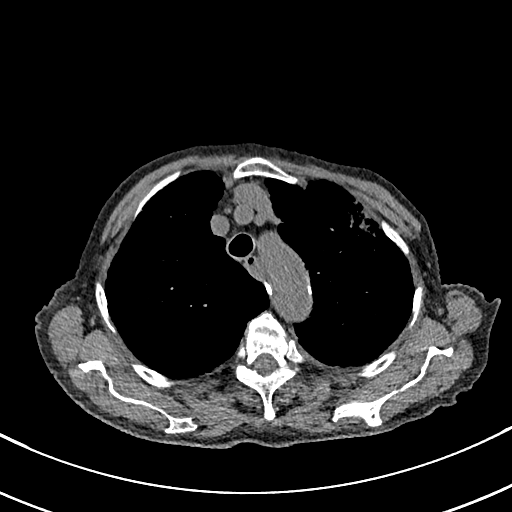
[im 110/142  lung]
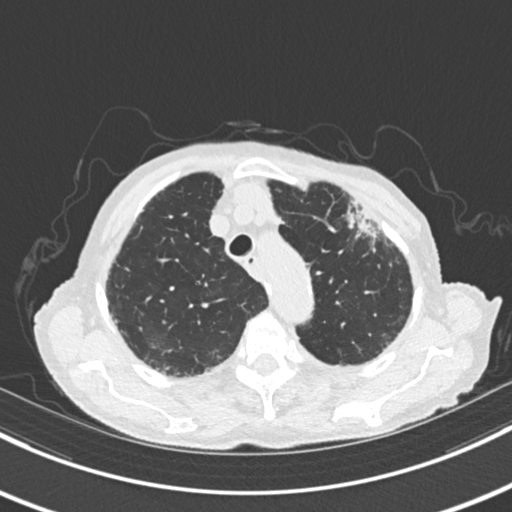
[im 121/142  lung]
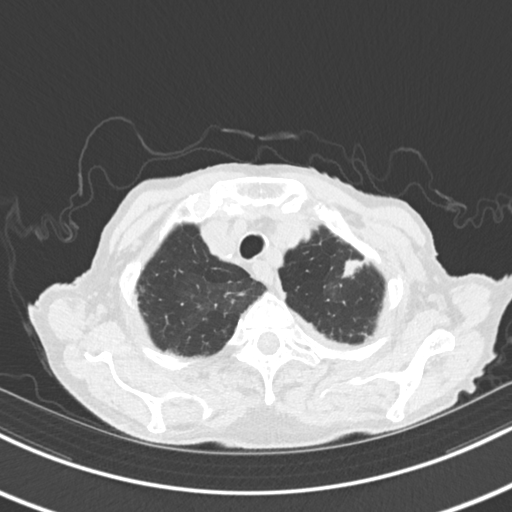
[im 131/142  lung]
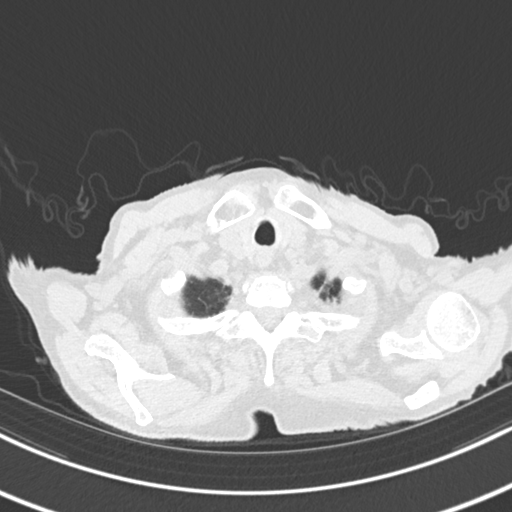

[Series 5: coronal · coronal · 0.56mm/px · 3 of 116 slices shown]
[im 24/116  lung]
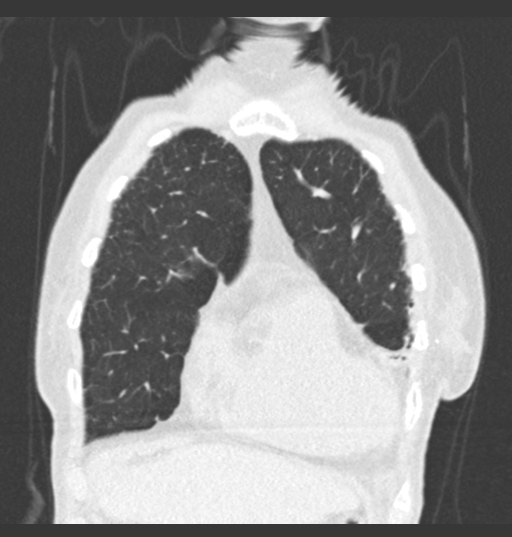
[im 47/116  lung]
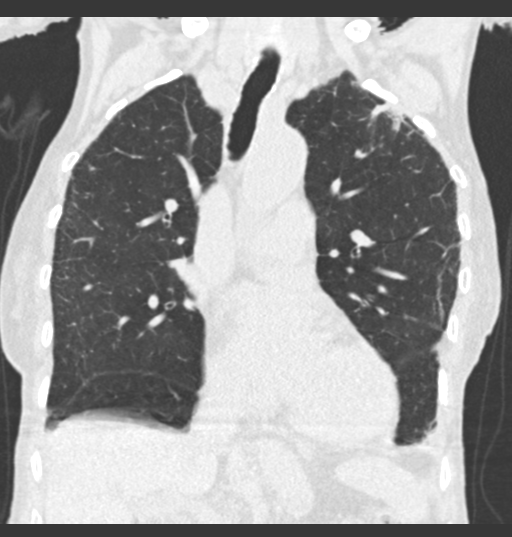
[im 70/116  lung]
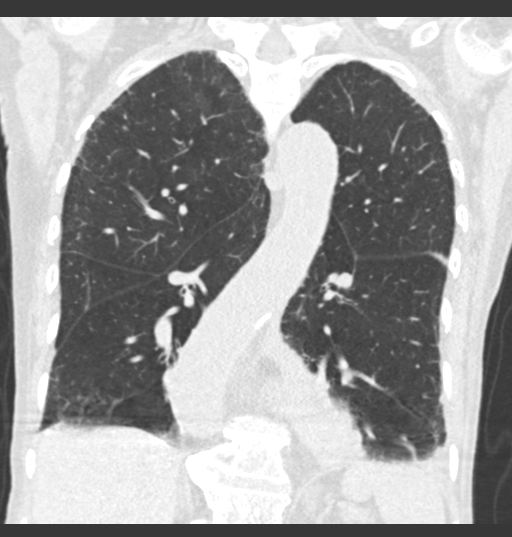

[14 of 36 positions shown; findings below may reference images not displayed]

FINDINGS: Cardiovascular: Cardiac size is normal. Trace pericardial fluid
within normal limits. Three-vessel coronary artery atherosclerosis
is present. Calcifications seen upon the aortic leaflets.
Hypoattenuation of the cardiac blood pool compatible with a mild
anemia. Atherosclerotic plaque within the normal caliber aorta.
Tortuosity of the thoracic aorta is similar to prior. No acute
periaortic abnormality is seen. No hyperdense mural thickening or
plaque displacement. Central pulmonary arteries are borderline
enlarged though similar to comparison and may reflect some chronic
pulmonary artery hypertension. No major venous abnormalities.

Mediastinum/Nodes: Small volume of fluid in the pericardial
recesses, likely within physiologic normal. No mediastinal fluid or
gas. Normal thyroid gland and thoracic inlet. No acute abnormality
of the trachea. Redemonstration of a hiatal hernia with some slight
organo-axial rotation. No evidence of frank resulting obstruction at
this time. No worrisome mediastinal or axillary adenopathy. Hilar
nodal evaluation is limited in the absence of intravenous contrast
media.

Lungs/Pleura: Persistent appearance of a spiculated airspace opacity
in the left lung apex extending to the left lung pleural surface
measuring 0.9 by 2 cm size, not significantly changed from
comparison when measured at similar levels. Some adjacent subpleural
reticular changes noted as well throughout the anterior segment left
upper lobe and more diffusely within the right upper lobe.
Additional bandlike areas of scarring and subpleural reticular
change in both lower lobes there is a new trace left pleural
effusion, nonspecific. No pneumothorax. No new airspace disease or
new nodules or concerning pulmonary masses. Diffuse mild airways
thickening is similar to prior. Background of mild centrilobular
emphysematous changes.

Upper Abdomen: No acute abnormalities present in the visualized
portions of the upper abdomen.

Musculoskeletal: Multilevel degenerative changes are present in the
imaged portions of the spine. No acute osseous abnormality or
suspicious osseous lesion. The osseous structures appear diffusely
demineralized which may limit detection of small or nondisplaced
fractures.
IMPRESSION: 1. Persistent appearance of a spiculated airspace opacity in the
left lung apex extending to the left lung pleural surface measuring
0.9 by 2 cm size, not significantly changed from comparison when
measured at similar levels. Some adjacent subpleural reticular
changes noted throughout the anterior segment left upper lobe and
more diffusely within the right upper lobe. Findings are favored to
reflect post infectious/inflammatory scarring. Consider additional 6
month follow-up CT to further document stability particularly given
patient's history of breast cancer.
2. More diffuse subpleural reticular changes may reflect some
developing interstitial fibrosis.
3. New trace left pleural effusion, nonspecific.
4. Three-vessel coronary artery atherosclerosis.
5. Redemonstration of a hiatal hernia with slight organo-axial
rotation.
6. Stable central pulmonary arterial enlargement may reflect chronic
pulmonary artery hypertension.
7. Hypoattenuation of the cardiac blood pool suggesting a mild
anemia.
8. Chronic bronchitic features and emphysema. Emphysema
([id number]-[id number]).
9. Aortic Atherosclerosis ([id number]).

## 2021-02-10 ENCOUNTER — Encounter: Payer: Self-pay | Admitting: Radiology

## 2021-02-12 ENCOUNTER — Other Ambulatory Visit: Payer: Self-pay

## 2021-02-12 ENCOUNTER — Ambulatory Visit (HOSPITAL_COMMUNITY)
Admission: RE | Admit: 2021-02-12 | Discharge: 2021-02-12 | Disposition: A | Discharge status: Home / Self Care | Payer: Medicare HMO | Source: Ambulatory Visit | Attending: Radiation Oncology | Admitting: Radiation Oncology

## 2021-02-12 ENCOUNTER — Encounter (HOSPITAL_COMMUNITY): Payer: Self-pay

## 2021-02-12 DIAGNOSIS — J939 Pneumothorax, unspecified: Secondary | ICD-10-CM | POA: Diagnosis not present

## 2021-02-12 DIAGNOSIS — C3412 Malignant neoplasm of upper lobe, left bronchus or lung: Secondary | ICD-10-CM

## 2021-02-12 DIAGNOSIS — R911 Solitary pulmonary nodule: Secondary | ICD-10-CM | POA: Diagnosis not present

## 2021-02-12 DIAGNOSIS — J90 Pleural effusion, not elsewhere classified: Secondary | ICD-10-CM | POA: Diagnosis not present

## 2021-02-12 DIAGNOSIS — J439 Emphysema, unspecified: Secondary | ICD-10-CM | POA: Diagnosis not present

## 2021-02-12 DIAGNOSIS — I700 Atherosclerosis of aorta: Secondary | ICD-10-CM | POA: Diagnosis not present

## 2021-02-16 ENCOUNTER — Ambulatory Visit
Admission: RE | Admit: 2021-02-16 | Discharge: 2021-02-16 | Disposition: A | Discharge status: Home / Self Care | Payer: Medicare HMO | Source: Ambulatory Visit | Attending: Radiation Oncology | Admitting: Radiation Oncology

## 2021-02-27 ENCOUNTER — Ambulatory Visit: Payer: Medicare HMO | Admitting: Internal Medicine

## 2021-03-02 ENCOUNTER — Ambulatory Visit: Payer: Medicare HMO | Admitting: Radiation Oncology

## 2021-03-07 NOTE — Progress Notes (Signed)
Radiation Oncology         ([phone]) [phone] ________________________________  Name: Ariel Medina MRN: [id number]  Date: 03/09/2021  DOB: 1928-08-08  Follow-Up Visit Note  CC: Colon Branch, MD  Garner Nash, DO    ICD-10-CM   1. Solitary pulmonary nodule  R91.1       Diagnosis: Primary malignant neoplasm of left upper lobe of lung ([place]). A diagnosis of solitary pulmonary nodule was also pertinent to this visit.  Interval Since Last Radiation: 7 months and 4 days    Treatment Intent: Curative   Radiation Treatment Dates: 07/29/2020 through 08/05/2020   Site: Left lung Technique: IMRT Total Dose (Gy): 54/54 Dose per Fx (Gy): 18 Completed Fx: 3/3 Beam Energies: 6XFFF   Narrative:  The patient returns today for routine follow-up.  The patient completed her SBRT treatment in the end of December of which she tolerated quite well. (The patient has not been seen for follow-up since she completed SBRT treatment on 08/05/20).  Pertinent imaging since she was last seen includes:   CT of the chest taken on 11/03/20 revealed regression of the left upper lobe pulmonary nodule compared to prior imaging. Multiple new areas of ill-defined nodularity were also noted in the left upper lobe near the apex favored to be of infectious / inflammatory etiology. Also seen were the persistent and seemingly more mass-like area more anteriorly in the left upper lobe; noted to be concerning for slow-growing neoplasm such as primary bronchogenic adenocarcinoma.     The most recent CT taken on 02/12/21 revealed shrinkage of the treated lesion consistent with SBRT treatment. Changes were also seen in the surrounding areas and in other parts of the lung, although it is uncertain what these changes are attributed to.           Allergies:  is allergic to ibuprofen.  Meds: Current Outpatient Medications  Medication Sig Dispense Refill   apixaban (ELIQUIS) 5 MG TABS tablet Take 1 tablet (5 mg total) by mouth  2 (two) times daily. 180 tablet 3   cyclobenzaprine (FLEXERIL) 5 MG tablet Take 1 tablet (5 mg total) by mouth 2 (two) times daily as needed for muscle spasms. 20 tablet 0   feeding supplement, ENSURE ENLIVE, (ENSURE ENLIVE) LIQD Take 237 mLs by mouth 2 (two) times daily between meals. 237 mL 12   hydrochlorothiazide (HYDRODIURIL) 25 MG tablet Take 1 tablet (25 mg total) by mouth daily. 90 tablet 1   levothyroxine (SYNTHROID) 75 MCG tablet Take 1 tablet (75 mcg total) by mouth daily before breakfast. 90 tablet 0   benzonatate (TESSALON) 100 MG capsule Take 1 capsule (100 mg total) by mouth 3 (three) times daily as needed. (Patient not taking: Reported on 03/09/2021) 20 capsule 0   fluticasone (FLONASE) 50 MCG/ACT nasal spray Place 2 sprays into both nostrils daily as needed for allergies or rhinitis. (Patient not taking: Reported on 03/09/2021) 48 g 0   potassium chloride (KLOR-CON) 10 MEQ tablet Take 1 tablet (10 mEq total) by mouth daily. (Patient not taking: Reported on 03/09/2021) 90 tablet 1   No current facility-administered medications for this encounter.    Physical Findings: The patient is in no acute distress. Patient is alert and oriented.  height is 5\' 3"  (1.6 m) and weight is 120 lb 4 oz (54.5 kg). Her temporal temperature is 96.8 F (36 C) (abnormal). Her blood pressure is 161/90 (abnormal) and her pulse is 77. Her respiration is 18 and oxygen saturation is 94%. Marland Kitchen  No significant changes. Lungs are clear to auscultation bilaterally. Heart has regular rate and rhythm. No palpable cervical, supraclavicular, or axillary adenopathy. Abdomen soft, non-tender, normal bowel sounds.    Lab Findings: Lab Results  Component Value Date   WBC 5.2 10/28/2020   HGB 12.1 10/28/2020   HCT 36.9 10/28/2020   MCV 80.1 10/28/2020   PLT 256.0 10/28/2020    Radiographic Findings: CT CHEST WO CONTRAST  Result Date: 02/13/2021 CLINICAL DATA:  Follow-up pulmonary nodule EXAM: CT CHEST WITHOUT CONTRAST  TECHNIQUE: Multidetector CT imaging of the chest was performed following the standard protocol without IV contrast. COMPARISON:  11/03/2020 FINDINGS: Cardiovascular: The heart is normal in size. No pericardial effusion. No evidence of thoracic aortic aneurysm. Atherosclerotic calcifications of the aortic root/arch. Mediastinum/Nodes: No suspicious mediastinal lymphadenopathy. Visualized thyroid is unremarkable. Lungs/Pleura: 13 x 8 mm nodule in the anterior left lung apex (series 10/image 32), previously 12 x 11 mm, grossly unchanged. Surrounding radiation changes. Continued progression of a anterior left upper lobe subpleural masslike opacity, now measuring 2.9 x 4.2 cm (series 10/image 42), previously less solid in appearance and measuring 2.1 x 4.3 cm. While this was previously assumed to reflect radiation changes on prior PET, as noted on recent CT, this appearance raises concern for primary bronchogenic neoplasm such as semi invasive adenocarcinoma. Mild centrilobular emphysematous changes. Superimposed subpleural reticulation/fibrosis in the lungs bilaterally, lower lung predominant. Small left pleural effusion, partially loculated and mildly progressive. Technically speaking, this is a trace anterior pneumothorax (series 10/image 101), although this is of questionable clinical significance. Upper Abdomen: Visualized upper abdomen is notable for a large hiatal hernia/inverted intrathoracic stomach. Vascular calcifications. Musculoskeletal: Mild degenerative changes of the visualized thoracolumbar spine. IMPRESSION: 13 x 8 mm nodule in the anterior left lung apex, corresponding to the patient's suspected primary bronchogenic neoplasm, with surrounding radiation changes. Continued progression of an anterior left upper lobe subpleural masslike opacity, now measuring 2.9 x 4.2 cm. While this was previously assumed to reflect remote radiation changes, the continued progression raises concern for primary  bronchogenic neoplasm such as semi invasive adenocarcinoma. Small left pleural effusion, partially loculated and mildly progressive. Trace anterior pneumothorax component, although of questionable clinical significance given the tiny size. Aortic Atherosclerosis (ICD10-I70.0) and Emphysema (ICD10-J43.9). Electronically Signed   By: Julian Hy M.D.   On: 02/13/2021 02:58    Impression:  Primary malignant neoplasm of left upper lobe of lung ([place]). A diagnosis of solitary pulmonary nodule was also pertinent to this visit.  She tolerated her SBRT without any significant side effects..  Recent chest CT scan shows the left upper lobe nodule will be slightly increased.  However there was noted to be continued progression of anterior left upper lobe subpleural masslike opacity for which limits he could not be ruled out.  It was recommended the patient proceed with a PET scan for further evaluation.  Plan: The patient will be scheduled for a PET scan in the near future and then follow-up afterward.  Patient would like to have the scan performed after [birth year].   20 minutes of total time was spent for this patient encounter, including preparation, face-to-face counseling with the patient and coordination of care, physical exam, and documentation of the encounter and ordering of upcoming PET scan. ____________________________________  Blair Promise, PhD, MD   This document serves as a record of services personally performed by Gery Pray, MD. It was created on his behalf by Roney Mans, a trained medical scribe. The  creation of this record is based on the scribe's personal observations and the provider's statements to them. This document has been checked and approved by the attending provider.

## 2021-03-09 ENCOUNTER — Encounter: Payer: Self-pay | Admitting: Radiation Oncology

## 2021-03-09 ENCOUNTER — Ambulatory Visit
Admission: RE | Admit: 2021-03-09 | Discharge: 2021-03-09 | Disposition: A | Discharge status: Home / Self Care | Payer: Medicare HMO | Source: Ambulatory Visit | Attending: Radiation Oncology | Admitting: Radiation Oncology

## 2021-03-09 ENCOUNTER — Other Ambulatory Visit: Payer: Self-pay

## 2021-03-09 DIAGNOSIS — Z79899 Other long term (current) drug therapy: Secondary | ICD-10-CM | POA: Diagnosis not present

## 2021-03-09 DIAGNOSIS — I700 Atherosclerosis of aorta: Secondary | ICD-10-CM | POA: Diagnosis not present

## 2021-03-09 DIAGNOSIS — Z08 Encounter for follow-up examination after completed treatment for malignant neoplasm: Secondary | ICD-10-CM | POA: Diagnosis not present

## 2021-03-09 DIAGNOSIS — Z923 Personal history of irradiation: Secondary | ICD-10-CM | POA: Insufficient documentation

## 2021-03-09 DIAGNOSIS — J90 Pleural effusion, not elsewhere classified: Secondary | ICD-10-CM | POA: Diagnosis not present

## 2021-03-09 DIAGNOSIS — Z7901 Long term (current) use of anticoagulants: Secondary | ICD-10-CM | POA: Insufficient documentation

## 2021-03-09 DIAGNOSIS — J432 Centrilobular emphysema: Secondary | ICD-10-CM | POA: Diagnosis not present

## 2021-03-09 DIAGNOSIS — R918 Other nonspecific abnormal finding of lung field: Secondary | ICD-10-CM | POA: Diagnosis not present

## 2021-03-09 DIAGNOSIS — K449 Diaphragmatic hernia without obstruction or gangrene: Secondary | ICD-10-CM | POA: Diagnosis not present

## 2021-03-09 DIAGNOSIS — C3412 Malignant neoplasm of upper lobe, left bronchus or lung: Secondary | ICD-10-CM | POA: Diagnosis not present

## 2021-03-09 DIAGNOSIS — R911 Solitary pulmonary nodule: Secondary | ICD-10-CM

## 2021-03-09 NOTE — Progress Notes (Signed)
Ariel Medina is here today for follow up post radiation to the lung.  Lung Side: Left  Does the patient complain of any of the following: Pain:No Shortness of breath w/wo exertion: Mild Cough: No Hemoptysis: No Pain with swallowing: No Swallowing/choking concerns: No Appetite: Good Energy Level: Good Post radiation skin Changes: No    Vitals:   03/09/21 1058  BP: (!) 161/90  Pulse: 77  Resp: 18  Temp: (!) 96.8 F (36 C)  TempSrc: Temporal  SpO2: 94%  Weight: 120 lb 4 oz (54.5 kg)  Height: 5\' 3"  (1.6 m)

## 2021-03-14 ENCOUNTER — Other Ambulatory Visit: Payer: Self-pay | Admitting: Internal Medicine

## 2021-04-08 ENCOUNTER — Ambulatory Visit: Payer: Medicare HMO | Admitting: Podiatry

## 2021-04-15 ENCOUNTER — Other Ambulatory Visit: Payer: Self-pay

## 2021-04-15 ENCOUNTER — Encounter: Payer: Self-pay | Admitting: Podiatry

## 2021-04-15 ENCOUNTER — Ambulatory Visit (INDEPENDENT_AMBULATORY_CARE_PROVIDER_SITE_OTHER): Payer: Medicare HMO | Admitting: Podiatry

## 2021-04-15 DIAGNOSIS — M79676 Pain in unspecified toe(s): Secondary | ICD-10-CM

## 2021-04-15 DIAGNOSIS — Q828 Other specified congenital malformations of skin: Secondary | ICD-10-CM

## 2021-04-15 DIAGNOSIS — B351 Tinea unguium: Secondary | ICD-10-CM | POA: Diagnosis not present

## 2021-04-15 DIAGNOSIS — L84 Corns and callosities: Secondary | ICD-10-CM

## 2021-04-15 NOTE — Progress Notes (Signed)
This patient returns to the office for evaluation and treatment of long thick painful nails .  This patient is unable to trim her own nails since the patient cannot reach the feet.  Patient says the nails are painful walking and wearing her shoes.  She has painful callus on the outside of her right foot.    She  returns for preventive foot care services.   General Appearance  Alert, conversant and in no acute stress.  Vascular  Dorsalis pedis and posterior tibial  pulses are weakly  palpable  bilaterally.  Capillary return is within normal limits  bilaterally. Temperature is within normal limits  bilaterally.  Neurologic  Senn-Weinstein monofilament wire test within normal limits  bilaterally. Muscle power within normal limits bilaterally.  Nails Thick disfigured discolored nails with subungual debris  from hallux to fifth toes bilaterally. No evidence of bacterial infection or drainage bilaterally.  Orthopedic  No limitations of motion  feet .  No crepitus or effusions noted.  No bony pathology or digital deformities noted. HAV  B/L  Skin  normotropic skin  noted bilaterally.  No signs of infections or ulcers noted.   Porokeratosis sub 5th met B/L.  Callus secondary HAV  B/L.  Onychomycosis  Pain in toes right foot  Pain in toes left foot  Porokeratosis sub 5th met B/L.  Debridement  of nails  1-5  B/L with a nail nipper.  Nails were then filed using a dremel tool with no incidents.  Debride porokeratosis with dremel tool     RTC 10  weeks   Gardiner Barefoot DPM

## 2021-04-17 ENCOUNTER — Other Ambulatory Visit: Payer: Self-pay

## 2021-04-17 ENCOUNTER — Ambulatory Visit (HOSPITAL_COMMUNITY)
Admission: RE | Admit: 2021-04-17 | Discharge: 2021-04-17 | Disposition: A | Discharge status: Home / Self Care | Payer: Medicare HMO | Source: Ambulatory Visit | Attending: Radiation Oncology | Admitting: Radiation Oncology

## 2021-04-17 DIAGNOSIS — C3492 Malignant neoplasm of unspecified part of left bronchus or lung: Secondary | ICD-10-CM | POA: Diagnosis not present

## 2021-04-17 DIAGNOSIS — K5730 Diverticulosis of large intestine without perforation or abscess without bleeding: Secondary | ICD-10-CM | POA: Diagnosis not present

## 2021-04-17 DIAGNOSIS — J432 Centrilobular emphysema: Secondary | ICD-10-CM | POA: Diagnosis not present

## 2021-04-17 DIAGNOSIS — Z79899 Other long term (current) drug therapy: Secondary | ICD-10-CM | POA: Insufficient documentation

## 2021-04-17 DIAGNOSIS — J90 Pleural effusion, not elsewhere classified: Secondary | ICD-10-CM | POA: Diagnosis not present

## 2021-04-17 DIAGNOSIS — R911 Solitary pulmonary nodule: Secondary | ICD-10-CM

## 2021-04-17 DIAGNOSIS — C3490 Malignant neoplasm of unspecified part of unspecified bronchus or lung: Secondary | ICD-10-CM | POA: Diagnosis not present

## 2021-04-17 DIAGNOSIS — R918 Other nonspecific abnormal finding of lung field: Secondary | ICD-10-CM | POA: Diagnosis not present

## 2021-04-17 LAB — GLUCOSE, CAPILLARY: Glucose-Capillary: 101 mg/dL — ABNORMAL HIGH (ref 70–99)

## 2021-04-17 MED ORDER — FLUDEOXYGLUCOSE F - 18 (FDG) INJECTION
6.0000 | Freq: Once | INTRAVENOUS | Status: AC
  Administered 2021-04-17: 5.9 via INTRAVENOUS

## 2021-04-18 NOTE — Progress Notes (Signed)
Radiation Oncology         [phone] ________________________________  Name: Ariel Medina MRN: [id number]  Date: 04/20/2021  DOB: 1928-03-29  Follow-Up Visit Note  CC: Colon Branch, MD  Garner Nash, DO    ICD-10-CM   1. Solitary pulmonary nodule  R91.1 CT CHEST WO CONTRAST      Diagnosis: Primary malignant neoplasm of left upper lobe of lung ([place]). A diagnosis of solitary pulmonary nodule was also pertinent to this visit.  Interval Since Last Radiation: 8 months and 15 days   Treatment Intent: Curative   Radiation Treatment Dates: 07/29/2020 through 08/05/2020   Site: Left lung Technique: IMRT Total Dose (Gy): 54/54 Dose per Fx (Gy): 18 Completed Fx: 3/3 Beam Energies: 6XFFF  Narrative:  The patient returns today for follow-up to go over recent imaging results, she was last seen by me for follow-up on 03/09/21. PET performed on 04/17/21 demonstrated abnormal increased radiotracer uptake associated with the progressive area of increased soft tissue nodularity in the left upper lobe; noted as concerning for residual/recurrent tumor. No signs of FDG avid nodal or solid organ metastases was seen. Additionally visualized were two foci of increased uptake localizing to the right second and third ribs; favored to represent sequelae nondisplaced fractures.      Patient does report falling recently prior to her PET scan which explains the activity along the right second and third ribs.  She denies any new cough or hemoptysis.  She denies any pain along the chest other than the areas along the rib cage as noted above.                   Allergies:  is allergic to ibuprofen.  Meds: Current Outpatient Medications  Medication Sig Dispense Refill   apixaban (ELIQUIS) 5 MG TABS tablet Take 1 tablet (5 mg total) by mouth 2 (two) times daily. 180 tablet 3   benzonatate (TESSALON) 100 MG capsule Take 1 capsule (100 mg total) by mouth 3 (three) times daily as needed. (Patient not  taking: Reported on 03/09/2021) 20 capsule 0   cyclobenzaprine (FLEXERIL) 5 MG tablet Take 1 tablet (5 mg total) by mouth 2 (two) times daily as needed for muscle spasms. 20 tablet 0   feeding supplement, ENSURE ENLIVE, (ENSURE ENLIVE) LIQD Take 237 mLs by mouth 2 (two) times daily between meals. 237 mL 12   fluticasone (FLONASE) 50 MCG/ACT nasal spray Place 2 sprays into both nostrils daily as needed for allergies or rhinitis. (Patient not taking: Reported on 03/09/2021) 48 g 0   hydrochlorothiazide (HYDRODIURIL) 25 MG tablet Take 1 tablet (25 mg total) by mouth daily. 90 tablet 1   levothyroxine (SYNTHROID) 75 MCG tablet Take 1 tablet (75 mcg total) by mouth daily before breakfast. 90 tablet 0   potassium chloride (KLOR-CON) 10 MEQ tablet Take 1 tablet (10 mEq total) by mouth daily. (Patient not taking: Reported on 03/09/2021) 90 tablet 1   No current facility-administered medications for this encounter.    Physical Findings: The patient is in no acute distress. Patient is alert and oriented.  height is 5\' 3"  (1.6 m) and weight is 122 lb (55.3 kg). Her temperature is 98.1 F (36.7 C). Her blood pressure is 145/85 (abnormal) and her pulse is 80. Her respiration is 24 (abnormal) and oxygen saturation is 96%. .  No significant changes. Lungs are clear to auscultation bilaterally. Heart has regular rate and rhythm. No palpable cervical, supraclavicular, or axillary adenopathy. Abdomen soft,  non-tender, normal bowel sounds.   Lab Findings: Lab Results  Component Value Date   WBC 5.2 10/28/2020   HGB 12.1 10/28/2020   HCT 36.9 10/28/2020   MCV 80.1 10/28/2020   PLT 256.0 10/28/2020    Radiographic Findings: NM PET Image Restag (PS) Skull Base To Thigh  Result Date: 04/19/2021 CLINICAL DATA:  Subsequent treatment strategy for non-small cell lung cancer. EXAM: NUCLEAR MEDICINE PET SKULL BASE TO THIGH TECHNIQUE: 5.9 mCi F-18 FDG was injected intravenously. Full-ring PET imaging was performed from  the skull base to thigh after the radiotracer. CT data was obtained and used for attenuation correction and anatomic localization. Fasting blood glucose: 101 mg/dl COMPARISON:  PET-CT 06/24/2020 and CT chest 02/12/2021 FINDINGS: Mediastinal blood pool activity: SUV max 2.29 Liver activity: SUV max NA NECK: No hypermetabolic lymph nodes in the neck. Incidental CT findings: None. CHEST: The left upper lobe lung mass has increased in size in the interval. This measures 2.3 x 1.7 cm with SUV max 6.18, image 16/8. On the PET-CT from 06/24/2020 this measured 1.7 x 1.2 cm with SUV max of 4.62. No additional FDG avid pulmonary nodule or mass identified. No hypermetabolic axillary, supraclavicular, mediastinal, or hilar lymph nodes. Incidental CT findings: Mild changes of paraseptal and centrilobular emphysema. Aortic atherosclerosis. Small left pleural effusion is identified which is increased from previous exam. ABDOMEN/PELVIS: No abnormal hypermetabolic activity within the liver, pancreas, adrenal glands, or spleen. No hypermetabolic lymph nodes in the abdomen or pelvis. Incidental CT findings: Aortic atherosclerosis. Sigmoid diverticulosis. Previous right hemicolectomy with enterocolonic anastomosis. SKELETON: Increased radiotracer uptake localizing to the anterior aspect of the right second rib has an SUV max of 4.28, image 23/8. There is slight cortical irregularity suggesting nondisplaced fracture. A second focus of increased uptake localizes to the lateral aspect of the right third rib with SUV max of 3.58, image 24/8. Which is also favored to represent posttraumatic change. No additional foci of abnormal increased uptake within the imaged portions of the axial and appendicular skeleton. Incidental CT findings: Hello IMPRESSION: 1. There is abnormal increased radiotracer uptake associated with the progressive area of increased soft tissue nodularity in the left upper lobe concerning for residual/recurrent tumor. No  signs of FDG avid nodal or solid organ metastases. 2. Two foci of increased uptake localizing to the right second and third ribs are favored to represent sequelae nondisplaced fractures. Correlate for any recent history of trauma to the right chest wall. Electronically Signed   By: Kerby Moors M.D.   On: 04/19/2021 15:52    Impression:  Primary malignant neoplasm of left upper lobe of lung ([place]). A diagnosis of solitary pulmonary nodule was also pertinent to this visit.  Unfortunately the patient's recent PET scan does confirm CT findings of disease along the site of her previous SBRT treatment.  We discussed potential options at this time including potential CT-guided biopsy.  Given the patient's age and medical issues she does not wish to consider CT-guided biopsy to confirm recurrence which may possibly help to also guide additional therapy.  In light of patient's age and medical issues she is not a candidate for surgery.  She would be a candidate for conventional radiation over approximately 6 weeks directed at the area of uptake on PET scan.  Patient understands this is much more involved procedure than her previous SBRT treatments.  She Is accompanied by her granddaughter today.  Patient is unsure whether she would like to consider radiation therapy with more conventional in nature  at this time.  She would like to discuss with additional family members.  I have asked her to call back and we can get her scheduled for conventional radiation therapy if this is what she desires.  Plan: Patient is deciding on whether to pursue conventional radiation therapy at this time.  In the meantime I recommended we place a order for a chest chest CT scan to occur in approximately 3 months with follow-up soon afterward assuming she does not proceed with radiation therapy at this time.  The patient is in agreement to this to this additional imaging in 3 months.   22 minutes of total time was spent for this patient  encounter, including preparation, face-to-face counseling with the patient and coordination of care, physical exam, and documentation of the encounter. ____________________________________  Blair Promise, PhD, MD   This document serves as a record of services personally performed by Gery Pray, MD. It was created on his behalf by Roney Mans, a trained medical scribe. The creation of this record is based on the scribe's personal observations and the provider's statements to them. This document has been checked and approved by the attending provider.

## 2021-04-20 ENCOUNTER — Ambulatory Visit
Admission: RE | Admit: 2021-04-20 | Discharge: 2021-04-20 | Disposition: A | Discharge status: Home / Self Care | Payer: Medicare HMO | Source: Ambulatory Visit | Attending: Radiation Oncology | Admitting: Radiation Oncology

## 2021-04-20 ENCOUNTER — Encounter: Payer: Self-pay | Admitting: Radiation Oncology

## 2021-04-20 ENCOUNTER — Other Ambulatory Visit: Payer: Self-pay

## 2021-04-20 DIAGNOSIS — R911 Solitary pulmonary nodule: Secondary | ICD-10-CM

## 2021-04-20 DIAGNOSIS — J90 Pleural effusion, not elsewhere classified: Secondary | ICD-10-CM | POA: Insufficient documentation

## 2021-04-20 DIAGNOSIS — Z7901 Long term (current) use of anticoagulants: Secondary | ICD-10-CM | POA: Diagnosis not present

## 2021-04-20 DIAGNOSIS — Z79899 Other long term (current) drug therapy: Secondary | ICD-10-CM | POA: Insufficient documentation

## 2021-04-20 DIAGNOSIS — Z08 Encounter for follow-up examination after completed treatment for malignant neoplasm: Secondary | ICD-10-CM | POA: Diagnosis not present

## 2021-04-20 DIAGNOSIS — K5730 Diverticulosis of large intestine without perforation or abscess without bleeding: Secondary | ICD-10-CM | POA: Diagnosis not present

## 2021-04-20 DIAGNOSIS — Z923 Personal history of irradiation: Secondary | ICD-10-CM | POA: Insufficient documentation

## 2021-04-20 DIAGNOSIS — R918 Other nonspecific abnormal finding of lung field: Secondary | ICD-10-CM | POA: Insufficient documentation

## 2021-04-20 DIAGNOSIS — C3412 Malignant neoplasm of upper lobe, left bronchus or lung: Secondary | ICD-10-CM | POA: Diagnosis not present

## 2021-04-20 DIAGNOSIS — I700 Atherosclerosis of aorta: Secondary | ICD-10-CM | POA: Diagnosis not present

## 2021-04-20 NOTE — Progress Notes (Signed)
Ariel Medina is here today for follow up post radiation to the lung.  Lung Side: Left, completed radiation treatment on 08/05/20  Does the patient complain of any of the following: Pain: Patient reports having some soreness due to a fall last week.  Shortness of breath w/wo exertion: yes, on exertion.  Cough: no Hemoptysis: no Pain with swallowing: no Swallowing/choking concerns: no Appetite: Reports having a good appetite.  Energy Level: Reports having  a good energy level.  Post radiation skin Changes: Skin remains intact.     Additional comments if applicable:  Vitals:   48/25/00 1103  BP: (!) 145/85  Pulse: 80  Resp: (!) 24  Temp: 98.1 F (36.7 C)  SpO2: 96%  Weight: 122 lb (55.3 kg)  Height: 5\' 3"  (1.6 m)

## 2021-06-10 ENCOUNTER — Ambulatory Visit (INDEPENDENT_AMBULATORY_CARE_PROVIDER_SITE_OTHER): Payer: Medicare HMO

## 2021-06-10 ENCOUNTER — Ambulatory Visit: Payer: Medicare HMO

## 2021-06-10 VITALS — Ht 63.0 in | Wt 122.0 lb

## 2021-06-10 DIAGNOSIS — Z0000 Encounter for general adult medical examination without abnormal findings: Secondary | ICD-10-CM

## 2021-06-10 NOTE — Patient Instructions (Signed)
[name] , Thank you for taking time to complete your Medicare Wellness Visit. I appreciate your ongoing commitment to your health goals. Please review the following plan we discussed and let me know if I can assist you in the future.   Screening recommendations/referrals: Colonoscopy: No longer required Mammogram: Declined today. Bone Density: Declined today.Per our conversation you will call the office when you are ready to schedule Recommended yearly ophthalmology/optometry visit for glaucoma screening and checkup Recommended yearly dental visit for hygiene and checkup  Vaccinations: Influenza vaccine: Up to date Pneumococcal vaccine: Up to date Tdap vaccine: Up to date-Due-05/25/2028 Shingles vaccine: Completed vaccines   Covid-19:Booster available at the pharmacy.  Advanced directives: Please bring a copy of Living Will and/or Healthcare Power of Attorney for your chart.   Conditions/risks identified: See problem list  Next appointment: Follow up in one year for your annual wellness visit    Preventive Care 65 Years and Older, Female Preventive care refers to lifestyle choices and visits with your health care provider that can promote health and wellness. What does preventive care include? A yearly physical exam. This is also called an annual well check. Dental exams once or twice a year. Routine eye exams. Ask your health care provider how often you should have your eyes checked. Personal lifestyle choices, including: Daily care of your teeth and gums. Regular physical activity. Eating a healthy diet. Avoiding tobacco and drug use. Limiting alcohol use. Practicing safe sex. Taking low-dose aspirin every day. Taking vitamin and mineral supplements as recommended by your health care provider. What happens during an annual well check? The services and screenings done by your health care provider during your annual well check will depend on your age, overall health,  lifestyle risk factors, and family history of disease. Counseling  Your health care provider may ask you questions about your: Alcohol use. Tobacco use. Drug use. Emotional well-being. Home and relationship well-being. Sexual activity. Eating habits. History of falls. Memory and ability to understand (cognition). Work and work Statistician. Reproductive health. Screening  You may have the following tests or measurements: Height, weight, and BMI. Blood pressure. Lipid and cholesterol levels. These may be checked every 5 years, or more frequently if you are over 3 years old. Skin check. Lung cancer screening. You may have this screening every year starting at age 85 if you have a 30-pack-year history of smoking and currently smoke or have quit within the past 15 years. Fecal occult blood test (FOBT) of the stool. You may have this test every year starting at age 50. Flexible sigmoidoscopy or colonoscopy. You may have a sigmoidoscopy every 5 years or a colonoscopy every 10 years starting at age 25. Hepatitis C blood test. Hepatitis B blood test. Sexually transmitted disease (STD) testing. Diabetes screening. This is done by checking your blood sugar (glucose) after you have not eaten for a while (fasting). You may have this done every 1-3 years. Bone density scan. This is done to screen for osteoporosis. You may have this done starting at age 36. Mammogram. This may be done every 1-2 years. Talk to your health care provider about how often you should have regular mammograms. Talk with your health care provider about your test results, treatment options, and if necessary, the need for more tests. Vaccines  Your health care provider may recommend certain vaccines, such as: Influenza vaccine. This is recommended every year. Tetanus, diphtheria, and acellular pertussis (Tdap, Td) vaccine. You may need a Td booster every 10 years.  Zoster vaccine. You may need this after age  32. Pneumococcal 13-valent conjugate (PCV13) vaccine. One dose is recommended after age 71. Pneumococcal polysaccharide (PPSV23) vaccine. One dose is recommended after age 57. Talk to your health care provider about which screenings and vaccines you need and how often you need them. This information is not intended to replace advice given to you by your health care provider. Make sure you discuss any questions you have with your health care provider. Document Released: 08/22/2015 Document Revised: 04/14/2016 Document Reviewed: 05/27/2015 Elsevier Interactive Patient Education  2017 [place] Prevention in the Home Falls can cause injuries. They can happen to people of all ages. There are many things you can do to make your home safe and to help prevent falls. What can I do on the outside of my home? Regularly fix the edges of walkways and driveways and fix any cracks. Remove anything that might make you trip as you walk through a door, such as a raised step or threshold. Trim any bushes or trees on the path to your home. Use bright outdoor lighting. Clear any walking paths of anything that might make someone trip, such as rocks or tools. Regularly check to see if handrails are loose or broken. Make sure that both sides of any steps have handrails. Any raised decks and porches should have guardrails on the edges. Have any leaves, snow, or ice cleared regularly. Use sand or salt on walking paths during winter. Clean up any spills in your garage right away. This includes oil or grease spills. What can I do in the bathroom? Use night lights. Install grab bars by the toilet and in the tub and shower. Do not use towel bars as grab bars. Use non-skid mats or decals in the tub or shower. If you need to sit down in the shower, use a plastic, non-slip stool. Keep the floor dry. Clean up any water that spills on the floor as soon as it happens. Remove soap buildup in the tub or shower  regularly. Attach bath mats securely with double-sided non-slip rug tape. Do not have throw rugs and other things on the floor that can make you trip. What can I do in the bedroom? Use night lights. Make sure that you have a light by your bed that is easy to reach. Do not use any sheets or blankets that are too big for your bed. They should not hang down onto the floor. Have a firm chair that has side arms. You can use this for support while you get dressed. Do not have throw rugs and other things on the floor that can make you trip. What can I do in the kitchen? Clean up any spills right away. Avoid walking on wet floors. Keep items that you use a lot in easy-to-reach places. If you need to reach something above you, use a strong step stool that has a grab bar. Keep electrical cords out of the way. Do not use floor polish or wax that makes floors slippery. If you must use wax, use non-skid floor wax. Do not have throw rugs and other things on the floor that can make you trip. What can I do with my stairs? Do not leave any items on the stairs. Make sure that there are handrails on both sides of the stairs and use them. Fix handrails that are broken or loose. Make sure that handrails are as long as the stairways. Check any carpeting to make sure that  it is firmly attached to the stairs. Fix any carpet that is loose or worn. Avoid having throw rugs at the top or bottom of the stairs. If you do have throw rugs, attach them to the floor with carpet tape. Make sure that you have a light switch at the top of the stairs and the bottom of the stairs. If you do not have them, ask someone to add them for you. What else can I do to help prevent falls? Wear shoes that: Do not have high heels. Have rubber bottoms. Are comfortable and fit you well. Are closed at the toe. Do not wear sandals. If you use a stepladder: Make sure that it is fully opened. Do not climb a closed stepladder. Make sure that  both sides of the stepladder are locked into place. Ask someone to hold it for you, if possible. Clearly mark and make sure that you can see: Any grab bars or handrails. First and last steps. Where the edge of each step is. Use tools that help you move around (mobility aids) if they are needed. These include: Canes. Walkers. Scooters. Crutches. Turn on the lights when you go into a dark area. Replace any light bulbs as soon as they burn out. Set up your furniture so you have a clear path. Avoid moving your furniture around. If any of your floors are uneven, fix them. If there are any pets around you, be aware of where they are. Review your medicines with your doctor. Some medicines can make you feel dizzy. This can increase your chance of falling. Ask your doctor what other things that you can do to help prevent falls. This information is not intended to replace advice given to you by your health care provider. Make sure you discuss any questions you have with your health care provider. Document Released: 05/22/2009 Document Revised: 01/01/2016 Document Reviewed: 08/30/2014 Elsevier Interactive Patient Education  2017 [employer].

## 2021-06-10 NOTE — Progress Notes (Addendum)
Subjective:   Ariel Medina is a 87 y.o. female who presents for Medicare Annual (Subsequent) preventive examination.  I connected with Albert today by telephone and verified that I am speaking with the correct person using two identifiers. Location patient: home Location provider: work Persons participating in the virtual visit: patient, Marine scientist.    I discussed the limitations, risks, security and privacy concerns of performing an evaluation and management service by telephone and the availability of in person appointments. I also discussed with the patient that there may be a patient responsible charge related to this service. The patient expressed understanding and verbally consented to this telephonic visit.    Interactive audio and video telecommunications were attempted between this provider and patient, however failed, due to patient having technical difficulties OR patient did not have access to video capability.  We continued and completed visit with audio only.  Some vital signs may be absent or patient reported.   Time Spent with patient on telephone encounter: 25 minutes   Review of Systems     Cardiac Risk Factors include: advanced age (>64men, >79 women);hypertension;dyslipidemia;sedentary lifestyle     Objective:    Today's Vitals   06/10/21 1021  Weight: 122 lb (55.3 kg)  Height: 5\' 3"  (1.6 m)   Body mass index is 21.61 kg/m.  Advanced Directives 06/10/2021 04/20/2021 03/09/2021 07/09/2020 05/26/2020 05/13/2020 05/08/2020  Does Patient Have a Medical Advance Directive? Yes Yes Yes Yes Yes Yes Yes  Type of Advance Directive Living will Living will - Living will - Living will;Healthcare Power of [place] will  Does patient want to make changes to medical advance directive? - No - Patient declined No - Patient declined No - Patient declined No - Patient declined No - Patient declined No - Patient declined  Copy of [place] in Chart? - - - - -  No - copy requested -    Current Medications (verified) Outpatient Encounter Medications as of 06/10/2021  Medication Sig   apixaban (ELIQUIS) 5 MG TABS tablet Take 1 tablet (5 mg total) by mouth 2 (two) times daily.   cyclobenzaprine (FLEXERIL) 5 MG tablet Take 1 tablet (5 mg total) by mouth 2 (two) times daily as needed for muscle spasms.   feeding supplement, ENSURE ENLIVE, (ENSURE ENLIVE) LIQD Take 237 mLs by mouth 2 (two) times daily between meals.   hydrochlorothiazide (HYDRODIURIL) 25 MG tablet Take 1 tablet (25 mg total) by mouth daily.   levothyroxine (SYNTHROID) 75 MCG tablet Take 1 tablet (75 mcg total) by mouth daily before breakfast.   benzonatate (TESSALON) 100 MG capsule Take 1 capsule (100 mg total) by mouth 3 (three) times daily as needed. (Patient not taking: No sig reported)   fluticasone (FLONASE) 50 MCG/ACT nasal spray Place 2 sprays into both nostrils daily as needed for allergies or rhinitis. (Patient not taking: No sig reported)   potassium chloride (KLOR-CON) 10 MEQ tablet Take 1 tablet (10 mEq total) by mouth daily. (Patient not taking: No sig reported)   No facility-administered encounter medications on file as of 06/10/2021.    Allergies (verified) Ibuprofen   History: Past Medical History:  Diagnosis Date   Anemia    Breast CA ([place])    left breast-invasive ductal ca stage 1-stopped arimidex 2008   Diverticulitis of colon    GI bleed 04/2020   Hiatal hernia    History of radiation therapy 08/05/2020   56 Gray in 3 fractions to the left upper lung nodule  07/29/2020-08/05/2020.   Dr Gery Pray   Hyperlipidemia    Hypertension    Hypothyroidism    Osteoarthritis    Osteopenia    DEXA 7/02   Syncope 2005   CT head (-), ECHO essent. neg, stress test (-), carotid u/s (-)   Past Surgical History:  Procedure Laterality Date   BREAST BIOPSY Bilateral    BREAST LUMPECTOMY  04/2002   left   PARTIAL COLECTOMY N/A 05/01/2020   Procedure: PARTIAL COLECTOMY;   Surgeon: Coralie Keens, MD;  Location: [facility];  Service: General;  Laterality: N/A;   TONSILLECTOMY AND ADENOIDECTOMY     Family History  Problem Relation Age of Onset   Heart attack Brother 48   Diabetes Mother    Stroke Father 63   Stroke Sister 46   Colon cancer Neg Hx    Breast cancer Neg Hx    Social History   Socioeconomic History   Marital status: Widowed    Spouse name: Not on file   Number of children: 2   Years of education: Not on file   Highest education level: Not on file  Occupational History   Occupation: n/a    Employer: RETIRED  Tobacco Use   Smoking status: Never   Smokeless tobacco: Never  Vaping Use   Vaping Use: Never used  Substance and Sexual Activity   Alcohol use: No   Drug use: No   Sexual activity: Not Currently  Other Topics Concern   Not on file  Social History Narrative   Lost a son 66   G-son  live w/ her from time to time, not permanently   [place] lives 15 min away    Sister live next door , does help the pt    G-son @ [employer] , G-daughter @ [employer] since 1993   still drives, short distances, typically not at night.   Social Determinants of Health   Financial Resource Strain: Low Risk    Difficulty of Paying Living Expenses: Not hard at all  Food Insecurity: No Food Insecurity   Worried About Charity fundraiser in the Last Year: Never true   [place] in the Last Year: Never true  Transportation Needs: No Transportation Needs   Lack of Transportation (Medical): No   Lack of Transportation (Non-Medical): No  Physical Activity: Inactive   Days of Exercise per Week: 0 days   Minutes of Exercise per Session: 0 min  Stress: No Stress Concern Present   Feeling of Stress : Not at all  Social Connections: Moderately Isolated   Frequency of Communication with Friends and Family: More than three times a week   Frequency of Social Gatherings with Friends and Family: More than three times a week    Attends Religious Services: More than 4 times per year   Active Member of [employer] or Organizations: No   Attends Archivist Meetings: Never   Marital Status: Widowed    Tobacco Counseling Counseling given: Not Answered   Clinical Intake:  Pre-visit preparation completed: Yes  Pain : No/denies pain     BMI - recorded: 21.61 Nutritional Status: BMI of 19-24  Normal Nutritional Risks: None Diabetes: No  How often do you need to have someone help you when you read instructions, pamphlets, or other written materials from your doctor or pharmacy?: 1 - Never  Diabetic?No  Interpreter Needed?: No  Information entered by :: Caroleen Hamman LPN   Activities of  Daily Living In your present state of health, do you have any difficulty performing the following activities: 06/10/2021  Hearing? N  Vision? N  Difficulty concentrating or making decisions? N  Walking or climbing stairs? Y  Dressing or bathing? Y  Comment needs assistance with shower  Doing errands, shopping? N  Preparing Food and eating ? N  Using the Toilet? N  In the past six months, have you accidently leaked urine? Y  Comment occasionally  Do you have problems with loss of bowel control? N  Managing your Medications? N  Managing your Finances? N  Housekeeping or managing your Housekeeping? N  Some recent data might be hidden    Patient Care Team: Colon Branch, MD as PCP - General Minus Breeding, MD as PCP - Cardiology (Cardiology) Thornell Sartorius, MD as Consulting Physician (Otolaryngology) Gardiner Barefoot, DPM as Consulting Physician (Podiatry) Day, Melvenia Beam, [facility] (Inactive) as Pharmacist (Pharmacist)  Indicate any recent Medical Services you may have received from other than [facility] providers in the past year (date may be approximate).     Assessment:   This is a routine wellness examination for [place].  Hearing/Vision screen Hearing Screening - Comments:: C/o mild hearing loss Vision  Screening - Comments:: Last eye exam-Spring 2022-[facility] Reading glasses  Dietary issues and exercise activities discussed: Current Exercise Habits: The patient does not participate in regular exercise at present, Exercise limited by: None identified   Goals Addressed             This Visit's Progress    Maintain health of mind and body and maintain independence   On track      Depression Screen PHQ 2/9 Scores 06/10/2021 09/02/2020 03/03/2020 03/03/2020 06/26/2019 07/25/2017 07/20/2016  PHQ - 2 Score 0 0 0 0 0 0 0    Fall Risk Fall Risk  06/10/2021 09/02/2020 03/03/2020 06/26/2019 07/25/2017  Falls in the past year? 1 1 1  0 No  Number falls in past yr: 0 0 0 - -  Injury with Fall? 0 0 0 - -  Risk for fall due to : History of fall(s) - - - -  Follow up Falls prevention discussed - Education provided;Falls prevention discussed Falls evaluation completed -    FALL RISK PREVENTION PERTAINING TO THE HOME:  Any stairs in or around the home? Yes  If so, are there any without handrails? No  Home free of loose throw rugs in walkways, pet beds, electrical cords, etc? Yes  Adequate lighting in your home to reduce risk of falls? Yes   ASSISTIVE DEVICES UTILIZED TO PREVENT FALLS:  Life alert? No  Use of a cane, walker or w/c? Yes  Grab bars in the bathroom? Yes  Shower chair or bench in shower? Yes  Elevated toilet seat or a handicapped toilet? Yes   TIMED UP AND GO:  Was the test performed? No . Phone visit   Cognitive Function:Normal cognitive status assessed by this Nurse Health Advisor. No abnormalities found.       6CIT Screen 03/03/2020  What Year? 0 points  What month? 0 points  What time? 0 points  Count back from 20 0 points  Months in reverse 0 points  Repeat phrase 0 points  Total Score 0    Immunizations Immunization History  Administered Date(s) Administered   Fluad Quad(high Dose 65+) 06/02/2020   H1N1 08/29/2008   Influenza Whole 05/12/2010    Influenza, High Dose Seasonal PF 06/07/2013, 05/13/2015, 07/20/2016, 04/20/2017, 05/25/2018, 05/28/2019  Influenza,inj,Quad PF,6+ Mos 06/10/2014   Influenza-Unspecified 06/01/2021   PFIZER(Purple Top)SARS-COV-2 Vaccination 09/30/2019, 10/24/2019, 07/31/2020   PPD Test 12/14/2016   Pneumococcal Conjugate-13 06/10/2014   Pneumococcal Polysaccharide-23 07/10/2003, 02/21/2008, 11/23/2017   Td 05/16/2007, 05/25/2018   Zoster Recombinat (Shingrix) 10/12/2018, 06/28/2019   Zoster, Live 12/13/2012    TDAP status: Up to date  Flu Vaccine status: Up to date  Pneumococcal vaccine status: Up to date  Covid-19 vaccine status: Information provided on how to obtain vaccines.   Qualifies for Shingles Vaccine? No   Zostavax completed Yes   Shingrix Completed?: Yes  Screening Tests Health Maintenance  Topic Date Due   COVID-19 Vaccine (4 - Booster for Pfizer series) 09/25/2020   MAMMOGRAM  06/25/2021   TETANUS/TDAP  05/25/2028   Pneumonia Vaccine 60+ Years old  Completed   INFLUENZA VACCINE  Completed   DEXA SCAN  Completed   Zoster Vaccines- Shingrix  Completed   HPV VACCINES  Aged Out    Health Maintenance  Health Maintenance Due  Topic Date Due   COVID-19 Vaccine (4 - Booster for Pfizer series) 09/25/2020    Colorectal cancer screening: No longer required.   Mammogram status: No longer required due to patient declines.  Bone Density status: Declined  Lung Cancer Screening: (Low Dose CT Chest recommended if Age 86-80 years, 30 pack-year currently smoking OR have quit w/in 15years.) does not qualify.     Additional Screening:  Hepatitis C Screening: does not qualify  Vision Screening: Recommended annual ophthalmology exams for early detection of glaucoma and other disorders of the eye. Is the patient up to date with their annual eye exam?  Yes  Who is the provider or what is the name of the office in which the patient attends annual eye exams? [facility]   Dental  Screening: Recommended annual dental exams for proper oral hygiene  Community Resource Referral / Chronic Care Management: CRR required this visit?  No   CCM required this visit?  No      Plan:     I have personally reviewed and noted the following in the patient's chart:   Medical and social history Use of alcohol, tobacco or illicit drugs  Current medications and supplements including opioid prescriptions.  Functional ability and status Nutritional status Physical activity Advanced directives List of other physicians Hospitalizations, surgeries, and ER visits in previous 12 months Vitals Screenings to include cognitive, depression, and falls Referrals and appointments  In addition, I have reviewed and discussed with patient certain preventive protocols, quality metrics, and best practice recommendations. A written personalized care plan for preventive services as well as general preventive health recommendations were provided to patient.   Due to this being a telephonic visit, the after visit summary with patients personalized plan was offered to patient via mail or my-chart. Patient declined at this time.   Marta Antu, LPN   [id number]  Nurse Health Advisor  Nurse Notes: None   I have reviewed and agree with Health Coaches documentation.  Kathlene November, MD

## 2021-06-21 ENCOUNTER — Other Ambulatory Visit: Payer: Self-pay | Admitting: Internal Medicine

## 2021-06-21 DIAGNOSIS — E039 Hypothyroidism, unspecified: Secondary | ICD-10-CM

## 2021-06-30 ENCOUNTER — Ambulatory Visit (INDEPENDENT_AMBULATORY_CARE_PROVIDER_SITE_OTHER): Payer: Medicare HMO | Admitting: Pharmacist

## 2021-06-30 DIAGNOSIS — Z7901 Long term (current) use of anticoagulants: Secondary | ICD-10-CM

## 2021-06-30 DIAGNOSIS — I10 Essential (primary) hypertension: Secondary | ICD-10-CM

## 2021-06-30 DIAGNOSIS — M810 Age-related osteoporosis without current pathological fracture: Secondary | ICD-10-CM

## 2021-07-05 NOTE — Patient Instructions (Signed)
Ariel Medina, It was a pleasure speaking with you today.  I have attached a summary of our visit today and information about your health goals.   Our next appointment is by telephone on August 14, 2021 at 1:45pm  Please call the care guide team at [phone] if you need to cancel or reschedule your appointment.    If you have any questions or concerns, please feel free to contact me either at the phone number below or with a MyChart message.   Keep up the good work!  Cherre Robins, PharmD Clinical Pharmacist [place] [phone] (direct line)  [phone] (main office number)   The patient verbalized understanding of instructions, educational materials, and care plan provided today and agreed to receive a mailed copy of patient instructions, educational materials, and care plan.     CARE PLAN ENTRY (see longitudinal plan of care for additional care plan information)  Current Barriers:  Chronic Disease Management support, education, and care coordination needs related to  Hypertension, Hyperlipidemia, Hypothyroidism, Osteoporosis   Hypertension BP Readings from Last 3 Encounters:  04/20/21 (!) 145/85  03/09/21 (!) 161/90  11/05/20 118/72   Pharmacist Clinical Goal(s): Over the next 90 days, patient will work with PharmD and providers to maintain BP goal <140/90 Current regimen:  Hydrochlorothiazide  25mg  daily Potassium 10 mEq once a day Patient self care activities - Over the next 90 days, patient will: Try dissolving one potassium tablet in 4 ounces water and drink mixture daily (may need to rinse cup with 2 more ounces of water to get all of potassium supplement)  Reschedule follow up with Dr [name] to check blood pressure - patient to rescheduled missed appointment as soon as she can arrange transportation.  Continue to take hydrochlorothiazide daily    Osteoporosis  Pharmacist Clinical Goal(s) Over the next 90 days, patient will  work with PharmD and providers to reduce risk of fracture due to osteoporosis  Current regimen:  Prolia 60mg  given subcutaneously every 6 months (last dose 10/28/2020) Interventions: Discussed that she is past due for next Prolia injection. Patient to call as soon as she has transportation arranged to rescheduled.  Fall Prevention discusses.  Patient self care activities - Over the next 90 days, patient will: Recommended 1200mg  of calcium per day from foods and supplements Recommended 1000IU daily of vitamin D from foods and supplements.   Hypothyroidism: Last TSH was in therapeutic range Current therapy:  Levothyroxine 51mcg daily  Interventions:  Continue current therapy    Medication management Pharmacist Clinical Goal(s): Over the next 90 days, patient will work with PharmD and providers to maintain optimal medication adherence Current pharmacy: [facility] Order Interventions Comprehensive medication review performed. Continue current medication management strategy Patient self care activities - Over the next 90 days, patient will: Focus on medication adherence by filling and taking medications appropriately  Take medications as prescribed Report any questions or concerns to PharmD and/or provider(s)  Patient Goals/Self-Care Activities Over the next 90 days, patient will:  take medications as prescribed,  focus on medication adherence by scheduling appointment to get Prolia Consider using [facility] transportation services for [place] facility appointment (call me if you decide to try our transportation service - Cherre Robins [phone]) Try dissolving potassium in water and drink mixture for easier administration.   Follow Up Plan: Telephone follow up appointment with care management team member scheduled for:  1 month

## 2021-07-05 NOTE — Chronic Care Management (AMB) (Signed)
Chronic Care Management Pharmacy Note  07/05/2021 Name:  Ariel Medina MRN:  [id number] DOB:  16-Nov-1927  Summary: Patient is due to see PCP for blood pressure recheck. Also is past due Prolia injection but she has not made appointment due to transportation issues.  She has temporality loaned her grandson her car for work.  Recommendations/ Plan: Discussed [facility] assisted transportation resources - patient declined.  Will check back with patient in 1 month regarding appointments and if still having difficulty with transportation to appointments.   Subjective: Ariel Medina is an 87 y.o. year old female who is a primary patient of Paz, Alda Berthold, MD.  The CCM team was consulted for assistance with disease management and care coordination needs.    Engaged with patient by telephone for follow up visit in response to provider referral for pharmacy case management and/or care coordination services.   Consent to Services:  The patient was given information about Chronic Care Management services, agreed to services, and gave verbal consent prior to initiation of services.  Please see initial visit note for detailed documentation.   Patient Care Team: Colon Branch, MD as PCP - General Minus Breeding, MD as PCP - Cardiology (Cardiology) Thornell Sartorius, MD as Consulting Physician (Otolaryngology) Gardiner Barefoot, DPM as Consulting Physician (Podiatry) Cherre Robins, RPH-CPP (Pharmacist)  Recent office visits: 01/14/2021 - Internal Med Inda Castle, NP) Acute video visit for bronchitis. Prescribed azithromycin and benzonatate 130m 3 times a day as needed.   Recent consult visits: 04/20/2021 - Radiology (Dr KSondra Come Chest CT. Recommended biopsy of suspecious area but patient declined due to age and medical issues. Also discussed radiation therapy. Patient considering.  04/15/2021 - Podiatry (Dr MPrudence Davidson Seen for onchomycosis of toenail. Nails debrided.  03/09/2021 - [facility](Dr KSondra Come PET scan. IMPRESSION: 1. Abnormal increased radiotracer uptake associated with the progressive area of increased soft tissue nodularity in the left upper lobe concerning for residual/recurrent tumor. No signs of FDG avid nodal or solid organ metastases. 2. Two foci of increased uptake localizing to the right second and third ribs are favored to represent sequelae nondisplaced fractures. 06/22/20222 - Podiatry (Dr MPrudence Davidson Seen for onchomycosis of toenail. Nails debrided.   Hospital visits: None in previous 6 months  Objective:  Lab Results  Component Value Date   CREATININE 0.66 10/28/2020   CREATININE 0.67 06/02/2020   CREATININE 0.61 05/12/2020    No results found for: HGBA1C Last diabetic Eye exam: No results found for: HMDIABEYEEXA  Last diabetic Foot exam: No results found for: HMDIABFOOTEX      Component Value Date/Time   CHOL 248 (H) 06/26/2019 0954   TRIG 96.0 06/26/2019 0954   TRIG 77 06/14/2006 0824   HDL 60.60 06/26/2019 0954   CHOLHDL 4 06/26/2019 0954   VLDL 19.2 06/26/2019 0954   LDLCALC 169 (H) 06/26/2019 0954   LDLDIRECT 150.2 06/07/2013 0907    Hepatic Function Latest Ref Rng & Units 06/02/2020 05/08/2020 05/05/2020  Total Protein 6.1 - 8.1 g/dL 6.4 4.4(L) 3.9(L)  Albumin 3.5 - 5.0 g/dL - 2.1(L) 1.8(L)  AST 10 - 35 U/L 14 18 14(L)  ALT 6 - 29 U/L 4(L) 11 8  Alk Phosphatase 38 - 126 U/L - 31(L) 27(L)  Total Bilirubin 0.2 - 1.2 mg/dL 0.5 0.7 0.5    Lab Results  Component Value Date/Time   TSH 3.47 10/28/2020 10:17 AM   TSH 3.92 06/02/2020 12:15 PM    CBC Latest Ref Rng &  Units 10/28/2020 06/02/2020 05/12/2020  WBC 4.0 - 10.5 K/uL 5.2 5.4 6.8  Hemoglobin 12.0 - 15.0 g/dL 12.1 10.6(L) 8.5(L)  Hematocrit 36.0 - 46.0 % 36.9 32.8(L) 27.0(L)  Platelets 150.0 - 400.0 K/uL 256.0 348 254    Lab Results  Component Value Date/Time   VD25OH 44 11/11/2011 10:13 AM   VD25OH 40 11/11/2010 10:06 AM    Clinical ASCVD: No  The ASCVD Risk score  (Arnett DK, et al., 2019) failed to calculate for the following reasons:   The 2019 ASCVD risk score is only valid for ages 71 to 78    Other: (CHADS2VASc if Afib, PHQ9 if depression, MMRC or CAT for COPD, ACT, DEXA)  Social History   Tobacco Use  Smoking Status Never  Smokeless Tobacco Never   BP Readings from Last 3 Encounters:  04/20/21 (!) 145/85  03/09/21 (!) 161/90  11/05/20 118/72   Pulse Readings from Last 3 Encounters:  04/20/21 80  03/09/21 77  11/05/20 73   Wt Readings from Last 3 Encounters:  06/10/21 122 lb (55.3 kg)  04/20/21 122 lb (55.3 kg)  03/09/21 120 lb 4 oz (54.5 kg)    Assessment: Review of patient past medical history, allergies, medications, health status, including review of consultants reports, laboratory and other test data, was performed as part of comprehensive evaluation and provision of chronic care management services.   SDOH:  (Social Determinants of Health) assessments and interventions performed:  SDOH Interventions    Flowsheet Row Most Recent Value  SDOH Interventions   Transportation Interventions Patient Refused  [name] has had to borrow her car,  Discussed [facility] Transportation services but patient declined referral.]       CCM Care Plan  Allergies  Allergen Reactions   Ibuprofen     REACTION: bleed    Medications Reviewed Today     Reviewed by Cherre Robins, RPH-CPP (Pharmacist) on 06/30/21 at 1234  Med List Status: <None>   Medication Order Taking? Sig Documenting Provider Last Dose Status Informant  apixaban (ELIQUIS) 5 MG TABS tablet [id number] Yes Take 1 tablet (5 mg total) by mouth 2 (two) times daily. Colon Branch, MD Taking Active   cyclobenzaprine (FLEXERIL) 5 MG tablet [id number] Yes Take 1 tablet (5 mg total) by mouth 2 (two) times daily as needed for muscle spasms. Colon Branch, MD Taking Active            Med Note (CANTER, Cheri Rous   Tue Oct 28, 2020  9:49 AM) PRN  feeding supplement, ENSURE Sharee Pimple  [place]) LIQD [id number] Yes Take 237 mLs by mouth 2 (two) times daily between meals. Edwin Dada, MD Taking Active   fluticasone [facility]) 50 MCG/ACT nasal spray [id number] Yes Place 2 sprays into both nostrils daily as needed for allergies or rhinitis. [name], [name], NP Taking Active   hydrochlorothiazide (HYDRODIURIL) 25 MG tablet [id number] Yes Take 1 tablet (25 mg total) by mouth daily. Colon Branch, MD Taking Active   levothyroxine (SYNTHROID) 75 MCG tablet [id number] Yes TAKE 1 TABLET ONE TIME DAILY BEFORE BREAKFAST Colon Branch, MD Taking Active   potassium chloride (KLOR-CON) 10 MEQ tablet [id number] Yes Take 1 tablet (10 mEq total) by mouth daily. Colon Branch, MD Taking Active            Med Note Antony Contras, Sandre Kitty   Tue Jun 30, 2021 12:30 PM) Patient has not been taking every day  Med List Note Corky Mull, CPhT 05/08/20 [id number]):  [place]            Patient Active Problem List   Diagnosis Date Noted   Solitary pulmonary nodule 07/09/2020   Abnormal findings on diagnostic imaging of lung 05/30/2020   Acute deep vein thrombosis (DVT) of proximal vein of both lower extremities ([place]) 05/30/2020   VTE (venous thromboembolism) 05/30/2020   Goals of care, counseling/discussion    Palliative care by specialist    DNR (do not resuscitate) discussion    Intussusception ([place]) 04/29/2020   Pulmonary embolism, bilateral ([place]) 04/29/2020   GI bleed 04/29/2020   Abnormal CT scan of lung 04/29/2020   Hypoxia 04/29/2020   Acute pulmonary embolism ([place]) 04/29/2020   Pre-ulcerative calluses 01/25/2020   Essential hypertension 11/29/2016   Closed fracture of multiple pubic rami ([place]) 11/29/2016   Fall    Closed fracture of left pubis ([place]) 11/28/2016   Hypokalemia 11/28/2016   Hearing loss 09/19/2015   PCP NOTES >>> 05/13/2015   Allergic rhinitis 10/24/2014   Bronchitis 10/24/2014   Annual physical exam 04/14/2011   BACK PAIN, shoulder pain 12/11/2007    Osteoporosis 10/20/2007   NEOPLASM, MALIGNANT, BREAST, HX OF 10/20/2007   Hypothyroidism 01/30/2007   Hyperlipidemia 01/30/2007   HTN and mild edema 01/30/2007   OSTEOARTHRITIS 01/30/2007    Immunization History  Administered Date(s) Administered   Fluad Quad(high Dose 65+) 06/02/2020   H1N1 08/29/2008   Influenza Whole 05/12/2010   Influenza, High Dose Seasonal PF 06/07/2013, 05/13/2015, 07/20/2016, 04/20/2017, 05/25/2018, 05/28/2019   Influenza,inj,Quad PF,6+ Mos 06/10/2014   Influenza-Unspecified 06/01/2021   PFIZER(Purple Top)SARS-COV-2 Vaccination 09/30/2019, 10/24/2019, 07/31/2020   PPD Test 12/14/2016   Pneumococcal Conjugate-13 06/10/2014   Pneumococcal Polysaccharide-23 07/10/2003, 02/21/2008, 11/23/2017   Td 05/16/2007, 05/25/2018   Zoster Recombinat (Shingrix) 10/12/2018, 06/28/2019   Zoster, Live 12/13/2012    Conditions to be addressed/monitored: HTN, Hypothyroidism, Osteoporosis, and history of PE on long term anticoagulation; history of breast cancer; Lung cancer.   Care Plan : General Pharmacy (Adult)  Updates made by Cherre Robins, RPH-CPP since 07/05/2021 12:00 AM     Problem: Hypertension; osteoporosis; Edema; hypothyroidism, lung cancer; history of breast cancer; history of pulmonary embolism with long term anticoagulation      Long-Range Goal: Provide education, support and care coordination for medication therapy and chronic conditions   Start Date: 06/30/2021  Priority: High  Note:   Current Barriers:  Does not adhere to prescribed medication regimen Does not maintain contact with provider office Need for transportation assistance  Pharmacist Clinical Goal(s):  Over the next 90 days, patient will maintain control of hypertension  as evidenced by blood pressure <140/90  adhere to prescribed medication regimen as evidenced by getting Prolia injection and refilling medications on time Increase adherence for potassium supplement  through collaboration  with PharmD and provider.  Provide transportation support and resources (patient declined 06/30/2021)   Interventions: 1:1 collaboration with Colon Branch, MD regarding development and update of comprehensive plan of care as evidenced by provider attestation and co-signature Inter-disciplinary care team collaboration (see longitudinal plan of care) Comprehensive medication review performed; medication list updated in electronic medical record  Hypertension / Edema / Low potassium: Uncontrolled Current treatment: Hydrochlorothiazide 38m daily in morning Potassium 10 mEq daily   Current home readings: Not checking blood pressure at home  BP Readings from Last 3 Encounters:  04/20/21 (!) 145/85  03/09/21 (!) 161/90  11/05/20 118/72  Patient reports she is not taking potassium daily due to size of tablet.  Denies hypotensive/hypertensive symptoms Interventions:  Recommended she try dissolving one potassium tablet in 4 ounces of water and drink mixture daily (may need to rinse cup with 2 more ounces of water to get all of potassium)  Needs follow up with PCP to check blood pressure - patient to rescheduled missed appointment as soon as her grandson returns her car or she can arrange transportation.  Continue to take hydrochlorothiazide daily   Osteoporosis: Current treatment: Prolia 20m injected subcutaneously every 6 months Last Prolia was 10/28/2020 - past due for next injection but patient has to postpone due to transportation  Last DEXA: 11/28/2016 T-Score at Left Forearm Radius 33% is -3.6  Lumbar spine was not utilized due to advanced degenerative changes. Bilateral hips were not utilized due to advanced arthritic changes as noted on radiographs of 11/28/2016.  Supplementation: Vitamin D 1000 IU daily.  Interventions:  Discussed that she is past due for next Prolia injection. Patient to call as soon as she has trasportation arranged to rescheduled.  Recommended 12062mof calcium  per day from foods and supplements Recommended 1000IU daily of vitamin D from foods and supplements.  Fall Prevention discusses.   Hypothyroidism: Last TSH was in therapeutic range Current therapy:  Levothyroxine 7530mdaily  Interventions:  Continue current therapy  SDOH:  Discussed medication cost - patient states not issues with medication costs.  Patient has had to cancel appointments due to loaning her grandson her car Interventions:  [facility].    Patient Goals/Self-Care Activities Over the next 90 days, patient will:  take medications as prescribed,  focus on medication adherence by scheduling appointment to get Prolia, and  Consider using [facility] services [facility] (call me if you decide to try our transportation service - TamCherre [phone] dissolving potassium in water and drink mixture for easier administration.   Follow Up Plan: Telephone follow up appointment with care management team member scheduled for:  1 month to check on appointments         Medication Assistance: None required.  Patient affirms current coverage meets needs.  [facility] [id number]: [phone]: [phone]: [phone]: [phone] Up:  Patient agrees to Care Plan and Follow-up.  Plan: Telephone follow up appointment with care management team member scheduled for:  1 month  TamCherre RobinsharmD Clinical Pharmacist [facility] [place]

## 2021-07-06 ENCOUNTER — Encounter: Payer: Self-pay | Admitting: Podiatry

## 2021-07-06 ENCOUNTER — Ambulatory Visit (INDEPENDENT_AMBULATORY_CARE_PROVIDER_SITE_OTHER): Payer: Medicare HMO | Admitting: Podiatry

## 2021-07-06 ENCOUNTER — Other Ambulatory Visit: Payer: Self-pay

## 2021-07-06 DIAGNOSIS — M79676 Pain in unspecified toe(s): Secondary | ICD-10-CM

## 2021-07-06 DIAGNOSIS — B351 Tinea unguium: Secondary | ICD-10-CM

## 2021-07-06 DIAGNOSIS — Q828 Other specified congenital malformations of skin: Secondary | ICD-10-CM

## 2021-07-06 NOTE — Progress Notes (Signed)
This patient returns to the office for evaluation and treatment of long thick painful nails .  This patient is unable to trim her own nails since the patient cannot reach the feet.  Patient says the nails are painful walking and wearing her shoes.  She has painful callus on the outside of her right foot.    She  returns for preventive foot care services.   General Appearance  Alert, conversant and in no acute stress.  Vascular  Dorsalis pedis and posterior tibial  pulses are weakly  palpable  bilaterally.  Capillary return is within normal limits  bilaterally. Temperature is within normal limits  bilaterally.  Neurologic  Senn-Weinstein monofilament wire test within normal limits  bilaterally. Muscle power within normal limits bilaterally.  Nails Thick disfigured discolored nails with subungual debris  from hallux to fifth toes bilaterally. No evidence of bacterial infection or drainage bilaterally.  Orthopedic  No limitations of motion  feet .  No crepitus or effusions noted.  No bony pathology or digital deformities noted. HAV  B/L  Skin  normotropic skin  noted bilaterally.  No signs of infections or ulcers noted.   Porokeratosis sub 5th met B/L.  Callus secondary HAV  B/L.  Onychomycosis  Pain in toes right foot  Pain in toes left foot  Porokeratosis sub 5th met B/L.  Debridement  of nails  1-5  B/L with a nail nipper.  Nails were then filed using a dremel tool with no incidents.  Debride porokeratosis with  # 15 blade and dremel tool     RTC 10  weeks   Illyanna Petillo DPM  

## 2021-07-08 DIAGNOSIS — I10 Essential (primary) hypertension: Secondary | ICD-10-CM

## 2021-07-08 DIAGNOSIS — M810 Age-related osteoporosis without current pathological fracture: Secondary | ICD-10-CM

## 2021-07-21 ENCOUNTER — Telehealth: Payer: Self-pay | Admitting: *Deleted

## 2021-07-21 NOTE — Telephone Encounter (Signed)
CALLED PATIENT TO INFORM OF CT SCAN FOR 08-13-21- ARRIVAL TIME- 9:45 AM @ [facility], NO RESTRICTIONS TO TEST, PATIENT TO RECEIVE RESULTS FROM DR. [place] ON 08-20-21 @ 9:30 AM, SPOKE WITH PATIENT AND SHE IS AWARE OF THESE APPTS.

## 2021-07-23 ENCOUNTER — Ambulatory Visit: Payer: Medicare HMO | Admitting: Radiation Oncology

## 2021-08-06 ENCOUNTER — Ambulatory Visit: Payer: Self-pay | Admitting: Radiation Oncology

## 2021-08-10 ENCOUNTER — Other Ambulatory Visit: Payer: Self-pay | Admitting: Internal Medicine

## 2021-08-10 DIAGNOSIS — I2699 Other pulmonary embolism without acute cor pulmonale: Secondary | ICD-10-CM

## 2021-08-13 ENCOUNTER — Other Ambulatory Visit: Payer: Self-pay

## 2021-08-13 ENCOUNTER — Ambulatory Visit (HOSPITAL_COMMUNITY)
Admission: RE | Admit: 2021-08-13 | Discharge: 2021-08-13 | Disposition: A | Discharge status: Home / Self Care | Payer: Medicare HMO | Source: Ambulatory Visit | Attending: Radiation Oncology | Admitting: Radiation Oncology

## 2021-08-13 DIAGNOSIS — J90 Pleural effusion, not elsewhere classified: Secondary | ICD-10-CM | POA: Diagnosis not present

## 2021-08-13 DIAGNOSIS — C3490 Malignant neoplasm of unspecified part of unspecified bronchus or lung: Secondary | ICD-10-CM | POA: Diagnosis not present

## 2021-08-13 DIAGNOSIS — J9811 Atelectasis: Secondary | ICD-10-CM | POA: Diagnosis not present

## 2021-08-13 DIAGNOSIS — R911 Solitary pulmonary nodule: Secondary | ICD-10-CM | POA: Diagnosis not present

## 2021-08-13 DIAGNOSIS — R918 Other nonspecific abnormal finding of lung field: Secondary | ICD-10-CM | POA: Diagnosis not present

## 2021-08-13 DIAGNOSIS — J948 Other specified pleural conditions: Secondary | ICD-10-CM | POA: Diagnosis not present

## 2021-08-13 DIAGNOSIS — J939 Pneumothorax, unspecified: Secondary | ICD-10-CM | POA: Diagnosis not present

## 2021-08-14 ENCOUNTER — Ambulatory Visit (INDEPENDENT_AMBULATORY_CARE_PROVIDER_SITE_OTHER): Payer: Medicare HMO | Admitting: Pharmacist

## 2021-08-14 DIAGNOSIS — Z79899 Other long term (current) drug therapy: Secondary | ICD-10-CM

## 2021-08-14 DIAGNOSIS — Z7901 Long term (current) use of anticoagulants: Secondary | ICD-10-CM

## 2021-08-14 DIAGNOSIS — I10 Essential (primary) hypertension: Secondary | ICD-10-CM

## 2021-08-14 DIAGNOSIS — M810 Age-related osteoporosis without current pathological fracture: Secondary | ICD-10-CM

## 2021-08-14 DIAGNOSIS — E039 Hypothyroidism, unspecified: Secondary | ICD-10-CM

## 2021-08-14 NOTE — Chronic Care Management (AMB) (Signed)
Chronic Care Management Pharmacy Note  08/14/2021 Name:  Ariel Medina MRN:  [id number] DOB:  [birth year]  Summary: Patient is due to see PCP for blood pressure recheck. Also is past due Prolia injection but she has not made appointment due to transportation issues.  She has temporality loaned her grandson her car for work. She anticipates she will have her car back in 2 weeks. Discussed [facility] assisted transportation resources - patient declined.  Patient only taking Preservision once daily due to cost. Education provided about [employer]'s over-the-counter benefits. Mailed her information and assisted with first order of #60 Preservision. Patient has $50 allowance to use every quarter.  Patient is not taking potassium daily due to large tablet size. Reminded her she can dissolve potassium in 4 ounces of water and drink mixture.   Subjective: Ariel Medina is an 88 y.o. year old female who is a primary patient of Paz, Alda Berthold, MD.  The CCM team was consulted for assistance with disease management and care coordination needs.    Engaged with patient by telephone for follow up visit in response to provider referral for pharmacy case management and/or care coordination services.   Consent to Services:  The patient was given information about Chronic Care Management services, agreed to services, and gave verbal consent prior to initiation of services.  Please see initial visit note for detailed documentation.   Patient Care Team: Colon Branch, MD as PCP - General Minus Breeding, MD as PCP - Cardiology (Cardiology) Thornell Sartorius, MD as Consulting Physician (Otolaryngology) Gardiner Barefoot, DPM as Consulting Physician (Podiatry) Cherre Robins, RPH-CPP (Pharmacist)  Recent office visits: 01/14/2021 - Internal Med Inda Castle, NP) Acute video visit for bronchitis. Prescribed azithromycin and benzonatate 117m 3 times a day as needed.   Recent consult visits: 07/06/2021 - podiatrist (Dr MPrudence Davidson  Seen for nail and foot care related to onychomycosis and porokeratosis. 04/20/2021 - Radiology (Dr KSondra Come Chest CT. Recommended biopsy of suspecious area but patient declined due to age and medical issues. Also discussed radiation therapy. Patient considering.  04/15/2021 - Podiatry (Dr MPrudence Davidson Seen for onchomycosis of toenail. Nails debrided.  03[facility]09/2021 [facility]Dr KSondra Come PET scan. IMPRESSION: 1. Abnormal increased radiotracer uptake associated with the progressive area of increased soft tissue nodularity in the left upper lobe concerning for residual/recurrent tumor. No signs of FDG avid nodal or solid organ metastases. 2. Two foci of increased uptake localizing to the right second and third ribs are favored to represent sequelae nondisplaced fractures. 06/22/20222 - Podiatry (Dr MPrudence Davidson Seen for onchomycosis of toenail. Nails debrided.   Hospital visits: None in previous 6 months  Objective:  Lab Results  Component Value Date   CREATININE 0.66 10/28/2020   CREATININE 0.67 06/02/2020   CREATININE 0.61 05/12/2020    No results found for: HGBA1C Last diabetic Eye exam: No results found for: HMDIABEYEEXA  Last diabetic Foot exam: No results found for: HMDIABFOOTEX      Component Value Date/Time   CHOL 248 (H) 06/26/2019 0954   TRIG 96.0 06/26/2019 0954   TRIG 77 06/14/2006 0824   HDL 60.60 06/26/2019 0954   CHOLHDL 4 06/26/2019 0954   VLDL 19.2 06/26/2019 0954   LDLCALC 169 (H) 06/26/2019 0954   LDLDIRECT 150.2 06/07/2013 0907    Hepatic Function Latest Ref Rng & Units 06/02/2020 05/08/2020 05/05/2020  Total Protein 6.1 - 8.1 g/dL 6.4 4.4(L) 3.9(L)  Albumin 3.5 - 5.0 g/dL - 2.1(L) 1.8(L)  AST 10 -  35 U/L 14 18 14(L)  ALT 6 - 29 U/L 4(L) 11 8  Alk Phosphatase 38 - 126 U/L - 31(L) 27(L)  Total Bilirubin 0.2 - 1.2 mg/dL 0.5 0.7 0.5    Lab Results  Component Value Date/Time   TSH 3.47 10/28/2020 10:17 AM   TSH 3.92 06/02/2020 12:15 PM    CBC  Latest Ref Rng & Units 10/28/2020 06/02/2020 05/12/2020  WBC 4.0 - 10.5 K/uL 5.2 5.4 6.8  Hemoglobin 12.0 - 15.0 g/dL 12.1 10.6(L) 8.5(L)  Hematocrit 36.0 - 46.0 % 36.9 32.8(L) 27.0(L)  Platelets 150.0 - 400.0 K/uL 256.0 348 254    Lab Results  Component Value Date/Time   VD25OH 44 11/11/2011 10:13 AM   VD25OH 40 11/11/2010 10:06 AM    Clinical ASCVD: No  The ASCVD Risk score (Arnett DK, et al., 2019) failed to calculate for the following reasons:   The 2019 ASCVD risk score is only valid for ages 36 to 24    Other: (CHADS2VASc if Afib, PHQ9 if depression, MMRC or CAT for COPD, ACT, DEXA)  Social History   Tobacco Use  Smoking Status Never  Smokeless Tobacco Never   BP Readings from Last 3 Encounters:  04/20/21 (!) 145/85  03/09/21 (!) 161/90  11/05/20 118/72   Pulse Readings from Last 3 Encounters:  04/20/21 80  03/09/21 77  11/05/20 73   Wt Readings from Last 3 Encounters:  06/10/21 122 lb (55.3 kg)  04/20/21 122 lb (55.3 kg)  03/09/21 120 lb 4 oz (54.5 kg)    Assessment: Review of patient past medical history, allergies, medications, health status, including review of consultants reports, laboratory and other test data, was performed as part of comprehensive evaluation and provision of chronic care management services.   SDOH:  (Social Determinants of Health) assessments and interventions performed:  SDOH Interventions    Flowsheet Row Most Recent Value  SDOH Interventions   Financial Strain Interventions Other (Comment)  [education provided regarding [employer] OTC benefits. Assisted patient in requesting order for Preservision vitamins.]  Transportation Interventions Patient Refused       CCM Care Plan  Allergies  Allergen Reactions   Ibuprofen     REACTION: bleed    Medications Reviewed Today     Reviewed by Cherre Robins, RPH-CPP (Pharmacist) on 08/14/21 at 1442  Med List Status: <None>   Medication Order Taking? Sig Documenting Provider Last Dose  Status Informant  cyclobenzaprine (FLEXERIL) 5 MG tablet [id number] Yes Take 1 tablet (5 mg total) by mouth 2 (two) times daily as needed for muscle spasms. Colon Branch, MD Taking Active            Med Note (CANTER, Cheri Rous   Tue Oct 28, 2020  9:49 AM) PRN  ELIQUIS 5 MG TABS tablet [id number] Yes TAKE 1 TABLET TWICE DAILY Colon Branch, MD Taking Active   feeding supplement, ENSURE Jeanne Ivan (ENSURE ENLIVE) LIQD [id number] Yes Take 237 mLs by mouth 2 (two) times daily between meals. Edwin Dada, MD Taking Active   fluticasone [facility]) 50 MCG/ACT nasal spray [id number] Yes Place 2 sprays into both nostrils daily as needed for allergies or rhinitis. [name], [name], NP Taking Active   hydrochlorothiazide (HYDRODIURIL) 25 MG tablet [id number] Yes Take 1 tablet (25 mg total) by mouth daily. Colon Branch, MD Taking Active   levothyroxine (SYNTHROID) 75 MCG tablet [id number] Yes TAKE 1 TABLET ONE TIME DAILY BEFORE BREAKFAST Colon Branch, MD Taking Active   Multiple Vitamins-Minerals (PRESERVISION  AREDS 2 PO) [id number] Yes Take 1 tablet by mouth in the morning and at bedtime. [provider] Taking Active            Med Note Antony Contras, Jerman Tinnon B   Fri Aug 14, 2021  2:42 PM) Only taking once daily due to cost  potassium chloride (KLOR-CON) 10 MEQ tablet [id number] No Take 1 tablet (10 mEq total) by mouth daily.  Patient not taking: Reported on 08/14/2021   Colon Branch, MD Not Taking Active            Med Note Antony Contras, [place] Jun 30, 2021 12:30 PM) Patient has not been taking every day  Med List Note Corky Mull, CPhT 05/08/20 0569): [place]            Patient Active Problem List   Diagnosis Date Noted   Solitary pulmonary nodule 07/09/2020   Abnormal findings on diagnostic imaging of lung 05/30/2020   Acute deep vein thrombosis (DVT) of proximal vein of both lower extremities ([place]) 05/30/2020   VTE (venous thromboembolism) 05/30/2020   Goals of care,  counseling/discussion    Palliative care by specialist    DNR (do not resuscitate) discussion    Intussusception ([place]) 04/29/2020   Pulmonary embolism, bilateral ([place]) 04/29/2020   GI bleed 04/29/2020   Abnormal CT scan of lung 04/29/2020   Hypoxia 04/29/2020   Acute pulmonary embolism ([place]) 04/29/2020   Pre-ulcerative calluses 01/25/2020   Essential hypertension 11/29/2016   Closed fracture of multiple pubic rami ([place]) 11/29/2016   Fall    Closed fracture of left pubis ([place]) 11/28/2016   Hypokalemia 11/28/2016   Hearing loss 09/19/2015   PCP NOTES >>> 05/13/2015   Allergic rhinitis 10/24/2014   Bronchitis 10/24/2014   Annual physical exam 04/14/2011   BACK PAIN, shoulder pain 12/11/2007   Osteoporosis 10/20/2007   NEOPLASM, MALIGNANT, BREAST, HX OF 10/20/2007   Hypothyroidism 01/30/2007   Hyperlipidemia 01/30/2007   HTN and mild edema 01/30/2007   OSTEOARTHRITIS 01/30/2007    Immunization History  Administered Date(s) Administered   Fluad Quad(high Dose 65+) 06/02/2020   H1N1 08/29/2008   Influenza Whole 05/12/2010   Influenza, High Dose Seasonal PF 06/07/2013, 05/13/2015, 07/20/2016, 04/20/2017, 05/25/2018, 05/28/2019   Influenza,inj,Quad PF,6+ Mos 06/10/2014   Influenza-Unspecified 06/01/2021   PFIZER(Purple Top)SARS-COV-2 Vaccination 09/30/2019, 10/24/2019, 07/31/2020   PPD Test 12/14/2016   Pneumococcal Conjugate-13 06/10/2014   Pneumococcal Polysaccharide-23 07/10/2003, 02/21/2008, 11/23/2017   Td 05/16/2007, 05/25/2018   Zoster Recombinat (Shingrix) 10/12/2018, 06/28/2019   Zoster, Live 12/13/2012    Conditions to be addressed/monitored: HTN, Hypothyroidism, Osteoporosis, and history of PE on long term anticoagulation; history of breast cancer; Lung cancer.   Care Plan : General Pharmacy (Adult)  Updates made by Cherre Robins, RPH-CPP since 08/14/2021 12:00 AM     Problem: Hypertension; osteoporosis; Edema; hypothyroidism, lung cancer; history of breast  cancer; history of pulmonary embolism with long term anticoagulation      Long-Range Goal: Provide education, support and care coordination for medication therapy and chronic conditions   Start Date: 06/30/2021  Priority: High  Note:   Current Barriers:  Does not adhere to prescribed medication regimen Does not maintain contact with provider office Need for transportation assistance  Pharmacist Clinical Goal(s):  Over the next 90 days, patient will maintain control of hypertension  as evidenced by blood pressure <140/90  adhere to prescribed medication regimen as evidenced by getting Prolia injection and refilling medications on time Increase adherence  for potassium supplement  through collaboration with PharmD and provider.  Provide transportation support and resources (patient declined 06/30/2021)   Interventions: 1:1 collaboration with Colon Branch, MD regarding development and update of comprehensive plan of care as evidenced by provider attestation and co-signature Inter-disciplinary care team collaboration (see longitudinal plan of care) Comprehensive medication review performed; medication list updated in electronic medical record  Hypertension / Edema / Low potassium: Uncontrolled Current treatment: Hydrochlorothiazide 51m daily in morning Potassium 10 mEq daily   Current home readings: Not checking blood pressure at home  BP Readings from Last 3 Encounters:  04/20/21 (!) 145/85  03/09/21 (!) 161/90  11/05/20 118/72  Patient reports she is not taking potassium daily due to size of tablet.  Denies hypotensive/hypertensive symptoms Interventions:  Reminded patient she can try dissolving one potassium tablet in 4 ounces of water and drink mixture daily (may need to rinse cup with 2 more ounces of water to get all of potassium)  Needs follow up with PCP to check blood pressure - patient to rescheduled missed appointment as soon as her grandson returns her car or she can  arrange transportation.  Continue to take hydrochlorothiazide daily   Osteoporosis: Current treatment: Prolia 6106minjected subcutaneously every 6 months Last Prolia was 10/28/2020 - past due for next injection but patient has to postpone due to transportation  Last DEXA: 11/28/2016 T-Score at Left Forearm Radius 33% is -3.6  Lumbar spine was not utilized due to advanced degenerative changes. Bilateral hips were not utilized due to advanced arthritic changes as noted on radiographs of 11/28/2016.  Supplementation: Vitamin D 1000 IU daily.  Interventions:  Discussed that she is past due for next Prolia injection. Patient to call as soon as she has trasportation arranged to rescheduled.  Recommended 120068mf calcium per day from foods and supplements Recommended 1000IU daily of vitamin D from foods and supplements.  Fall Prevention discusses.   Hypothyroidism: Last TSH was in therapeutic range Current therapy:  Levothyroxine 24m52maily  Interventions:  Continue current therapy  SDOH:  Discussed medication cost - patient states no issues with medication costs but she is only taking Preservision once a day due to cost of over-the-counter Preservision.  Patient has had to cancel appointments due to loaning her grandson her car Interventions:  Education provided regarding [employer] over-the-counter benefits. [facility] that [facility] gets $50 per quarter for over-the-counter products [facility] ordering [employer] vitamins ($25/#60) Patient will try and if she likes this brand will order more from [facility] of [employer] over-the-counter product catalog for 2023.  Discussed referral for [facility] services - patient declined.    Patient Goals/Self-Care Activities Over the next 90 days, patient will:  take medications as prescribed,  focus on medication adherence by scheduling appointment to get  Prolia, and  [facility] (call me if you decide to try our transportation service - TammCherre Robins-[phone] dissolving potassium in water and drink mixture for easier administration.   Follow Up Plan: Telephone follow up appointment with care management team member scheduled for:  2 to 3 months         Medication Assistance:  Education provided about OTC benefits. Catalog mailed and helped patient with first order. She has $50 per quarter to use for over-the-counter products.   Patient's preferred pharmacy is:  [facility]  [address] [place] [address] Phone: [phone] Fax: [phone]  [facility] [place], [place] [address] Phone: [phone] Fax: [phone]   Follow Up:  Patient agrees to Care Plan and Follow-up.  Plan: Telephone follow up appointment with care management team member scheduled for:  2 to 3  months  Cherre Robins, PharmD Clinical Pharmacist [facility]

## 2021-08-14 NOTE — Patient Instructions (Addendum)
Ariel Medina, It was a pleasure speaking with you today.  I have attached a summary of our visit today and information about your health goals.   Patient Goals/Self-Care Activities Over the next 90 days, patient will:  take medications as prescribed,  focus on medication adherence by scheduling appointment to get Prolia when transportation is available Consider using [facility] transportation services for [facility] facility appointment (call me if you decide to try our transportation service - Cherre Robins [phone]) Try dissolving potassium in water and drink mixture for easier administration.  I have ordered Preservision tablets through your [facility] over-the-counter benefits.  I have also included a catalog of over-the-counter products for [employer]. Please let me know if you need assistance ordering over-the-counter products. You have a $50 allowance you can use every quarter.   Our next appointment is by telephone on October 30, 2021 at 1:00pm  Please call the care guide team at [phone] if you need to cancel or reschedule your appointment.    If you have any questions or concerns, please feel free to contact me either at the phone number below or with a MyChart message.   Keep up the good work!  Cherre Robins, PharmD Clinical Pharmacist [place] [phone] (direct line)  [phone] (main office number)   The patient verbalized understanding of instructions, educational materials, and care plan provided today and agreed to receive a mailed copy of patient instructions, educational materials, and care plan.     CARE PLAN ENTRY  Hypertension BP Readings from Last 3 Encounters:  04/20/21 (!) 145/85  03/09/21 (!) 161/90  11/05/20 118/72   Pharmacist Clinical Goal(s): Over the next 90 days, patient will work with PharmD and providers to maintain BP goal <140/90 Current regimen:  Hydrochlorothiazide  25mg  daily Potassium 10 mEq once a day Patient  self care activities - Over the next 90 days, patient will: Try dissolving one potassium tablet in 4 ounces water and drink mixture daily (may need to rinse cup with 2 more ounces of water to get all of potassium supplement)  Reschedule follow up with Dr [name] to check blood pressure - patient to rescheduled missed appointment as soon as she can arrange transportation.  Continue to take hydrochlorothiazide daily    Osteoporosis  Pharmacist Clinical Goal(s) Over the next 90 days, patient will work with PharmD and providers to reduce risk of fracture due to osteoporosis  Current regimen:  Prolia 60mg  given subcutaneously every 6 months (last dose 10/28/2020) Interventions: Discussed that she is past due for next Prolia injection. Patient to call as soon as she has transportation arranged to rescheduled.  Fall Prevention discusses.  Patient self care activities - Over the next 90 days, patient will: Recommended 1200mg  of calcium per day from foods and supplements Recommended 1000IU daily of vitamin D from foods and supplements.   Hypothyroidism: Last TSH was in therapeutic range Current therapy:  Levothyroxine 79mcg daily  Interventions:  Continue current therapy    Medication management Pharmacist Clinical Goal(s): Over the next 90 days, patient will work with PharmD and providers to maintain optimal medication adherence Current pharmacy: [facility] Order Interventions Comprehensive medication review performed. Continue current medication management strategy Patient self care activities - Over the next 90 days, patient will: Focus on medication adherence by filling and taking medications appropriately  Take medications as prescribed Report any questions or concerns to PharmD and/or provider(s)    Follow Up Plan: Telephone follow up appointment with care management team  member scheduled for:  1 month

## 2021-08-20 ENCOUNTER — Ambulatory Visit: Payer: Medicare HMO | Admitting: Radiation Oncology

## 2021-09-08 DIAGNOSIS — M810 Age-related osteoporosis without current pathological fracture: Secondary | ICD-10-CM

## 2021-09-08 DIAGNOSIS — I10 Essential (primary) hypertension: Secondary | ICD-10-CM | POA: Diagnosis not present

## 2021-09-08 DIAGNOSIS — E039 Hypothyroidism, unspecified: Secondary | ICD-10-CM

## 2021-09-22 ENCOUNTER — Telehealth: Payer: Self-pay | Admitting: *Deleted

## 2021-09-22 NOTE — Telephone Encounter (Signed)
Returned patient's phone call, lvm for a return call 

## 2021-09-24 ENCOUNTER — Ambulatory Visit: Payer: Medicare HMO | Admitting: Radiation Oncology

## 2021-09-25 ENCOUNTER — Ambulatory Visit: Payer: Medicare HMO | Admitting: Podiatry

## 2021-10-09 ENCOUNTER — Encounter: Payer: Self-pay | Admitting: Internal Medicine

## 2021-10-16 ENCOUNTER — Ambulatory Visit: Payer: Medicare HMO | Admitting: Pharmacist

## 2021-10-16 ENCOUNTER — Telehealth: Payer: Self-pay | Admitting: Internal Medicine

## 2021-10-16 DIAGNOSIS — M810 Age-related osteoporosis without current pathological fracture: Secondary | ICD-10-CM

## 2021-10-16 DIAGNOSIS — Z79899 Other long term (current) drug therapy: Secondary | ICD-10-CM

## 2021-10-16 DIAGNOSIS — I2699 Other pulmonary embolism without acute cor pulmonale: Secondary | ICD-10-CM

## 2021-10-16 NOTE — Telephone Encounter (Signed)
Pt would like a call back regarding eye drop that was previously prescribed  ? ?Pt states she needs refill  ? ?Please advise  ?

## 2021-10-16 NOTE — Patient Instructions (Signed)
?  Ariel Medina, ?It was a pleasure speaking with you today.  ?I have attached a summary of our visit today and information about your health goals.  ? ?Patient Goals/Self-Care Activities  ?take medications as prescribed,  ?focus on medication adherence by scheduling appointment to get Prolia when transportation is available ?Consider using [facility] transportation services for [facility] appointment (call me if you decide to try our transportation service - Cherre Robins [phone]) ?Try dissolving potassium in water and drink mixture for easier administration.  ?I have ordered Preservision tablets through your [facility] over-the-counter benefits.  ? ?Our next appointment is by telephone on October 30, 2021 at 1:00pm ? ?Please call the care guide team at [phone] if you need to cancel or reschedule your appointment.  ? ? ?If you have any questions or concerns, please feel free to contact me either at the phone number below or with a MyChart message.  ? ?Keep up the good work! ? ?Cherre Robins, PharmD ?Clinical Pharmacist ?[facility] ?[place] ?[phone] (direct line)  ?[phone] (main office number) ? ? ?The patient verbalized understanding of instructions, educational materials, and care plan provided today and agreed to receive a mailed copy of patient instructions, educational materials, and care plan.  ?  ? ?CARE PLAN ENTRY ? ?Hypertension ?BP Readings from Last 3 Encounters:  ?04/20/21 (!) 145/85  ?03/09/21 (!) 161/90  ?11/05/20 118/72  ? ?Pharmacist Clinical Goal(s): ?Over the next 90 days, patient will work with PharmD and providers to maintain BP goal <140/90 ?Current regimen:  ?Hydrochlorothiazide  25mg  daily ?Potassium 10 mEq once a day ?Patient self care activities - Over the next 90 days, patient will: ?Try dissolving one potassium tablet in 4 ounces water and drink mixture daily (may need to rinse cup with 2 more ounces of water to get all of potassium supplement)  ?Reschedule  follow up with Dr [name] to check blood pressure - patient to rescheduled missed appointment as soon as she can arrange transportation.  ?Continue to take hydrochlorothiazide daily  ? ? ?Osteoporosis  ?Pharmacist Clinical Goal(s) ?Over the next 90 days, patient will work with PharmD and providers to reduce risk of fracture due to osteoporosis  ?Current regimen:  ?Prolia 60mg  given subcutaneously every 6 months (last dose 10/28/2020) ?Interventions: ?Discussed that she is past due for next Prolia injection. Patient to call as soon as she has transportation arranged to rescheduled.  ?Fall Prevention discusses.  ?Patient self care activities - Over the next 90 days, patient will: ?Recommended 1200mg  of calcium per day from foods and supplements ?Recommended 1000IU daily of vitamin D from foods and supplements.  ? ?Hypothyroidism: ?Last TSH was in therapeutic range ?Current therapy:  ?Levothyroxine 35mcg daily  ?Interventions:  ?Continue current therapy  ?  ?Medication management ?Pharmacist Clinical Goal(s): ?Over the next 90 days, patient will work with PharmD and providers to maintain optimal medication adherence ?Current pharmacy: [facility] ?Interventions ?Comprehensive medication review performed. ?Continue current medication management strategy ?Patient self care activities - Over the next 90 days, patient will: ?Focus on medication adherence by filling and taking medications appropriately  ?Take medications as prescribed ?Report any questions or concerns to PharmD and/or provider(s) ? ? ? ?The patient verbalized understanding of instructions, educational materials, and care plan provided today and agreed to receive a mailed copy of patient instructions, educational materials, and care plan.   ?

## 2021-10-16 NOTE — Chronic Care Management (AMB) (Addendum)
° ° °Chronic Care Management °Pharmacy Note ° °10/16/2021 °Name:  Ariel Medina MRN:  [id number] DOB:  01/18/1928 ° °Summary: °Since our last visit patient has been taking Preservision twice a day instead of once daily. She has been able to get on her over-the-counter benefits but asked for assistance in reordering. Assisted with reorder today. Education provided about [employer]'s over-the-counter benefits.  ° °Subjective: °Ariel Medina is an 88 y.o. year old female who is a primary patient of Paz, Jose E, MD.  The CCM team was consulted for assistance with disease management and care coordination needs.   ° °Engaged with patient by telephone for follow up visit in response to provider referral for pharmacy case management and/or care coordination services.  ° °Consent to Services:  °The patient was given information about Chronic Care Management services, agreed to services, and gave verbal consent prior to initiation of services.  Please see initial visit note for detailed documentation.  ° °Patient Care Team: °Paz, Jose E, MD as PCP - General °Hochrein, James, MD as PCP - Cardiology (Cardiology) °Crossley, James J, MD as Consulting Physician (Otolaryngology) °Mayer, Gregory, DPM as Consulting Physician (Podiatry) °Eckard, Tammy, RPH-CPP (Pharmacist) ° °Recent office visits: °01/14/2021 - Internal Med (O'Sullivan, NP) Acute video visit for bronchitis. Prescribed azithromycin and benzonatate 100mg 3 times a day as needed.  ° °Recent consult visits: °07/06/2021 - podiatrist (Dr Mayer) Seen for nail and foot care related to onychomycosis and porokeratosis. °04/20/2021 - Radiology (Dr Kinard) Chest CT. Recommended biopsy of suspecious area but patient declined due to age and medical issues. Also discussed radiation therapy. Patient considering.  °04/15/2021 - Podiatry (Dr Mayer) Seen for onchomycosis of toenail. Nails debrided.  °03/09/2021 - Radiology at [facility] (Dr Kinard) PET scan. IMPRESSION: 1. Abnormal  increased radiotracer uptake associated with the progressive area of increased soft tissue nodularity in the left upper lobe concerning for residual/recurrent tumor. No signs of FDG °avid nodal or solid organ metastases. 2. Two foci of increased uptake localizing to the right second and third ribs are favored to represent sequelae nondisplaced fractures. °06/22/20222 - Podiatry (Dr Mayer) Seen for onchomycosis of toenail. Nails debrided.  ° °Hospital visits: °None in previous 6 months ° °Objective: ° °Lab Results  °Component Value Date  ° CREATININE 0.66 10/28/2020  ° CREATININE 0.67 06/02/2020  ° CREATININE 0.61 05/12/2020  ° ° °No results found for: HGBA1C °Last diabetic Eye exam: No results found for: HMDIABEYEEXA  °Last diabetic Foot exam: No results found for: HMDIABFOOTEX  ° °   °Component Value Date/Time  ° CHOL 248 (H) 06/26/2019 0954  ° TRIG 96.0 06/26/2019 0954  ° TRIG 77 06/14/2006 0824  ° HDL 60.60 06/26/2019 0954  ° CHOLHDL 4 06/26/2019 0954  ° VLDL 19.2 06/26/2019 0954  ° LDLCALC 169 (H) 06/26/2019 0954  ° LDLDIRECT 150.2 06/07/2013 0907  ° ° °Hepatic Function Latest Ref Rng & Units 06/02/2020 05/08/2020 05/05/2020  °Total Protein 6.1 - 8.1 g/dL 6.4 4.4(L) 3.9(L)  °Albumin 3.5 - 5.0 g/dL - 2.1(L) 1.8(L)  °AST 10 - 35 U/L 14 18 14(L)  °ALT 6 - 29 U/L 4(L) 11 8  °Alk Phosphatase 38 - 126 U/L - 31(L) 27(L)  °Total Bilirubin 0.2 - 1.2 mg/dL 0.5 0.7 0.5  ° ° °Lab Results  °Component Value Date/Time  ° TSH 3.47 10/28/2020 10:17 AM  ° TSH 3.92 06/02/2020 12:15 PM  ° ° °CBC Latest Ref Rng & Units 10/28/2020 06/02/2020 05/12/2020  °WBC 4.0 - 10.5   10.5 K/uL 5.2 5.4 6.8  Hemoglobin 12.0 - 15.0 g/dL 12.1 10.6(L) 8.5(L)  Hematocrit 36.0 - 46.0 % 36.9 32.8(L) 27.0(L)  Platelets 150.0 - 400.0 K/uL 256.0 348 254    Lab Results  Component Value Date/Time   VD25OH 44 11/11/2011 10:13 AM   VD25OH 40 11/11/2010 10:06 AM    Clinical ASCVD: No  The ASCVD Risk score (Arnett DK, et al., 2019) failed to calculate for the  following reasons:   The 2019 ASCVD risk score is only valid for ages 49 to 58    Other: (CHADS2VASc if Afib, PHQ9 if depression, MMRC or CAT for COPD, ACT, DEXA)  Social History   Tobacco Use  Smoking Status Never  Smokeless Tobacco Never   BP Readings from Last 3 Encounters:  04/20/21 (!) 145/85  03/09/21 (!) 161/90  11/05/20 118/72   Pulse Readings from Last 3 Encounters:  04/20/21 80  03/09/21 77  11/05/20 73   Wt Readings from Last 3 Encounters:  06/10/21 122 lb (55.3 kg)  04/20/21 122 lb (55.3 kg)  03/09/21 120 lb 4 oz (54.5 kg)    Assessment: Review of patient past medical history, allergies, medications, health status, including review of consultants reports, laboratory and other test data, was performed as part of comprehensive evaluation and provision of chronic care management services.   SDOH:  (Social Determinants of Health) assessments and interventions performed:     CCM Care Plan  Allergies  Allergen Reactions   Ibuprofen     REACTION: bleed    Medications Reviewed Today     Reviewed by Cherre Robins, RPH-CPP (Pharmacist) on 10/16/21 at 1349  Med List Status: <None>   Medication Order Taking? Sig Documenting Provider Last Dose Status Informant  cyclobenzaprine (FLEXERIL) 5 MG tablet [id number] No Take 1 tablet (5 mg total) by mouth 2 (two) times daily as needed for muscle spasms. Colon Branch, MD Taking Active            Med Note (CANTER, Cheri Rous   Tue Oct 28, 2020  9:49 AM) PRN  ELIQUIS 5 MG TABS tablet [id number] No TAKE 1 TABLET TWICE DAILY Colon Branch, MD Taking Active   feeding supplement, ENSURE ENLIVE, (ENSURE ENLIVE) LIQD [id number] No Take 237 mLs by mouth 2 (two) times daily between meals. Edwin Dada, MD Taking Active   fluticasone [employer]) 50 MCG/ACT nasal spray [id number] No Place 2 sprays into both nostrils daily as needed for allergies or rhinitis. [name], [name], NP Taking Active   hydrochlorothiazide (HYDRODIURIL) 25 MG  tablet [id number] No Take 1 tablet (25 mg total) by mouth daily. Colon Branch, MD Taking Active   levothyroxine (SYNTHROID) 75 MCG tablet [id number] No TAKE 1 TABLET ONE TIME DAILY BEFORE BREAKFAST Colon Branch, MD Taking Active   Multiple Vitamins-Minerals (PRESERVISION AREDS 2 PO) [id number] No Take 1 tablet by mouth in the morning and at bedtime. [provider] Taking Active            Med Note Antony Contras, Ashiya Kinkead B   Fri Aug 14, 2021  2:42 PM) Only taking once daily due to cost  potassium chloride (KLOR-CON) 10 MEQ tablet [id number] No Take 1 tablet (10 mEq total) by mouth daily.  Patient not taking: Reported on 08/14/2021   Colon Branch, MD Not Taking Active            Med Note Antony Contras, [place] Jun 30, 2021 12:30 PM) Patient has not been taking  every day  Med List Note Corky Mull, CPhT 05/08/20 9528): [place]            Patient Active Problem List   Diagnosis Date Noted   Solitary pulmonary nodule 07/09/2020   Abnormal findings on diagnostic imaging of lung 05/30/2020   Acute deep vein thrombosis (DVT) of proximal vein of both lower extremities ([place]) 05/30/2020   VTE (venous thromboembolism) 05/30/2020   Goals of care, counseling/discussion    Palliative care by specialist    DNR (do not resuscitate) discussion    Intussusception ([place]) 04/29/2020   Pulmonary embolism, bilateral ([place]) 04/29/2020   GI bleed 04/29/2020   Abnormal CT scan of lung 04/29/2020   Hypoxia 04/29/2020   Acute pulmonary embolism ([place]) 04/29/2020   Pre-ulcerative calluses 01/25/2020   Essential hypertension 11/29/2016   Closed fracture of multiple pubic rami ([place]) 11/29/2016   Fall    Closed fracture of left pubis ([place]) 11/28/2016   Hypokalemia 11/28/2016   Hearing loss 09/19/2015   PCP NOTES >>> 05/13/2015   Allergic rhinitis 10/24/2014   Bronchitis 10/24/2014   Annual physical exam 04/14/2011   BACK PAIN, shoulder pain 12/11/2007   Osteoporosis 10/20/2007   NEOPLASM,  MALIGNANT, BREAST, HX OF 10/20/2007   Hypothyroidism 01/30/2007   Hyperlipidemia 01/30/2007   HTN and mild edema 01/30/2007   OSTEOARTHRITIS 01/30/2007    Immunization History  Administered Date(s) Administered   Fluad Quad(high Dose 65+) 06/02/2020   H1N1 08/29/2008   Influenza Whole 05/12/2010   Influenza, High Dose Seasonal PF 06/07/2013, 05/13/2015, 07/20/2016, 04/20/2017, 05/25/2018, 05/28/2019   Influenza,inj,Quad PF,6+ Mos 06/10/2014   Influenza-Unspecified 06/01/2021   PFIZER(Purple Top)SARS-COV-2 Vaccination 09/30/2019, 10/24/2019, 07/31/2020   PPD Test 12/14/2016   Pneumococcal Conjugate-13 06/10/2014   Pneumococcal Polysaccharide-23 07/10/2003, 02/21/2008, 11/23/2017   Td 05/16/2007, 05/25/2018   Zoster Recombinat (Shingrix) 10/12/2018, 06/28/2019   Zoster, Live 12/13/2012    Conditions to be addressed/monitored: HTN, Hypothyroidism, Osteoporosis, and history of PE on long term anticoagulation; history of breast cancer; Lung cancer.   Care Plan : General Pharmacy (Adult)  Updates made by Cherre Robins, RPH-CPP since 10/16/2021 12:00 AM     Problem: Hypertension; osteoporosis; Edema; hypothyroidism, lung cancer; history of breast cancer; history of pulmonary embolism with long term anticoagulation      Long-Range Goal: Provide education, support and care coordination for medication therapy and chronic conditions   Start Date: 06/30/2021  Priority: High  Note:   Current Barriers:  Does not adhere to prescribed medication regimen Does not maintain contact with provider office Need for transportation assistance  Pharmacist Clinical Goal(s):  Over the next 90 days, patient will maintain control of hypertension  as evidenced by blood pressure <140/90  adhere to prescribed medication regimen as evidenced by getting Prolia injection and refilling medications on time Increase adherence for potassium supplement  through collaboration with PharmD and provider.  Provide  transportation support and resources (patient declined 06/30/2021)   Interventions: 1:1 collaboration with Colon Branch, MD regarding development and update of comprehensive plan of care as evidenced by provider attestation and co-signature Inter-disciplinary care team collaboration (see longitudinal plan of care) Comprehensive medication review performed; medication list updated in electronic medical record  Hypertension / Edema / Low potassium: Uncontrolled Current treatment: Hydrochlorothiazide 43m daily in morning Potassium 10 mEq daily   Current home readings: Not checking blood pressure at home  BP Readings from Last 3 Encounters:  04/20/21 (!) 145/85  03/09/21 (!) 161/90  11/05/20 118/72  Patient reports  she is not taking potassium daily due to size of tablet.  Denies hypotensive/hypertensive symptoms Interventions:  Reminded patient she can try dissolving one potassium tablet in 4 ounces of water and drink mixture daily (may need to rinse cup with 2 more ounces of water to get all of potassium)  Needs follow up with PCP to check blood pressure - patient to rescheduled missed appointment as soon as her grandson returns her car or she can arrange transportation.  Continue to take hydrochlorothiazide daily   Osteoporosis: Current treatment: Prolia 37m injected subcutaneously every 6 months Last Prolia was 10/28/2020 - past due for next injection but patient has to postpone due to transportation  Last DEXA: 11/28/2016 T-Score at Left Forearm Radius 33% is -3.6  Lumbar spine was not utilized due to advanced degenerative changes. Bilateral hips were not utilized due to advanced arthritic changes as noted on radiographs of 11/28/2016.  Supplementation: Vitamin D 1000 IU daily.  Interventions:  Discussed that she is past due for next Prolia injection. Patient to call as soon as she has trasportation arranged to rescheduled.  Recommended 12038mof calcium per day from foods and  supplements Recommended 1000IU daily of vitamin D from foods and supplements.  Fall Prevention discusses.   Hypothyroidism: Last TSH was in therapeutic range Current therapy:  Levothyroxine 7564mdaily  Interventions:  Continue current therapy  SDOH:  Discussed medication cost - patient states no issues with medication costs but she is only taking Preservision once a day due to cost of over-the-counter Preservision.  Patient has had to cancel appointments due to loaning her grandson her car Interventions:  Education provided regarding [employer] over-the-counter benefits. [facility] that patient gets $50 per quarter for over-the-counter products [facility] with ordering [place] vitamins ($25/#60) Patient will try and if she likes this brand will order more from [facility] copy of [employer] over-the-counter product catalog for 2023. (At last visit) Today assisted patient in ordering AREDs vitamins [facility] with her benefits (has $25 left for quarter and AREDS cost $25) Discussed referral for [facility] services - patient declined. (Addressed at previous visit)   Patient Goals/Self-Care Activities Over the next 90 days, patient will:  take medications as prescribed,  focus on medication adherence by scheduling appointment to get Prolia, and  Consider using [facility] services [facility] (call me if you decide to try our transportation service - TamCherre [phone] dissolving potassium in water and drink mixture for easier administration.   Follow Up Plan: Telephone follow up appointment with care management team member scheduled for:  2 to 3 months         Medication Assistance:  Education provided about OTC benefits. Catalog mailed and helped patient with first order. She has $50 per quarter to use for over-the-counter products.   Patient[facility]  [facility] [id number]: [phone]: [phone]one: [phone]: [phone] Up:  Patient agrees to Care Plan and Follow-up.  Plan: Telephone follow up appointment with care management team member scheduled for:  check back in as planned 10/30/2021  TamCherre RobinsharmD Clinical Pharmacist [facility]

## 2021-10-16 NOTE — Telephone Encounter (Signed)
See notes. Patient assisted with [facility] over-the-counter order for AREDs vitamins.  ?

## 2021-10-27 ENCOUNTER — Other Ambulatory Visit: Payer: Self-pay

## 2021-10-27 ENCOUNTER — Ambulatory Visit (INDEPENDENT_AMBULATORY_CARE_PROVIDER_SITE_OTHER): Payer: Medicare HMO | Admitting: Podiatry

## 2021-10-27 ENCOUNTER — Encounter: Payer: Self-pay | Admitting: Podiatry

## 2021-10-27 DIAGNOSIS — M79676 Pain in unspecified toe(s): Secondary | ICD-10-CM

## 2021-10-27 DIAGNOSIS — L84 Corns and callosities: Secondary | ICD-10-CM

## 2021-10-27 DIAGNOSIS — Q828 Other specified congenital malformations of skin: Secondary | ICD-10-CM

## 2021-10-27 DIAGNOSIS — B351 Tinea unguium: Secondary | ICD-10-CM

## 2021-10-27 NOTE — Progress Notes (Signed)
This patient returns to the office for evaluation and treatment of long thick painful nails .  This patient is unable to trim her own nails since the patient cannot reach the feet.  Patient says the nails are painful walking and wearing her shoes.  She has painful callus on the outside of her right foot.    She  returns for preventive foot care services.   General Appearance  Alert, conversant and in no acute stress.  Vascular  Dorsalis pedis and posterior tibial  pulses are weakly  palpable  bilaterally.  Capillary return is within normal limits  bilaterally. Temperature is within normal limits  bilaterally.  Neurologic  Senn-Weinstein monofilament wire test within normal limits  bilaterally. Muscle power within normal limits bilaterally.  Nails Thick disfigured discolored nails with subungual debris  from hallux to fifth toes bilaterally. No evidence of bacterial infection or drainage bilaterally.  Orthopedic  No limitations of motion  feet .  No crepitus or effusions noted.  No bony pathology or digital deformities noted. HAV  B/L  Skin  normotropic skin  noted bilaterally.  No signs of infections or ulcers noted.   Porokeratosis sub 5th met B/L.  Callus secondary HAV  B/L.  Onychomycosis  Pain in toes right foot  Pain in toes left foot  Porokeratosis sub 5th met B/L.  Debridement  of nails  1-5  B/L with a nail nipper.  Nails were then filed using a dremel tool with no incidents.  Debride porokeratosis with  # 15 blade and dremel tool     RTC 10  weeks   Aradhana Gin DPM  

## 2021-10-30 ENCOUNTER — Telehealth: Payer: Self-pay | Admitting: Pharmacist

## 2021-10-30 ENCOUNTER — Telehealth: Payer: Medicare HMO

## 2021-10-30 NOTE — Telephone Encounter (Signed)
Attempted to contact patient for Chronic Care Management follow up phone visit. Unable to reach patient. LM on VM with my contact number [phone] ?Will try to outreach again in 1 week ?

## 2021-11-02 ENCOUNTER — Telehealth: Payer: Medicare HMO

## 2021-11-02 ENCOUNTER — Telehealth: Payer: Self-pay | Admitting: Pharmacist

## 2021-11-02 NOTE — Telephone Encounter (Signed)
Opened in error

## 2021-11-07 ENCOUNTER — Other Ambulatory Visit: Payer: Self-pay

## 2021-11-07 ENCOUNTER — Emergency Department (HOSPITAL_BASED_OUTPATIENT_CLINIC_OR_DEPARTMENT_OTHER): Payer: Medicare HMO

## 2021-11-07 ENCOUNTER — Inpatient Hospital Stay (HOSPITAL_BASED_OUTPATIENT_CLINIC_OR_DEPARTMENT_OTHER)
Admission: EM | Admit: 2021-11-07 | Discharge: 2021-11-16 | DRG: 193 | Disposition: A | Discharge status: Home Health | Payer: Medicare HMO | Attending: Internal Medicine | Admitting: Internal Medicine

## 2021-11-07 ENCOUNTER — Encounter (HOSPITAL_BASED_OUTPATIENT_CLINIC_OR_DEPARTMENT_OTHER): Payer: Self-pay | Admitting: Emergency Medicine

## 2021-11-07 DIAGNOSIS — Z79899 Other long term (current) drug therapy: Secondary | ICD-10-CM | POA: Diagnosis not present

## 2021-11-07 DIAGNOSIS — Z888 Allergy status to other drugs, medicaments and biological substances status: Secondary | ICD-10-CM

## 2021-11-07 DIAGNOSIS — J208 Acute bronchitis due to other specified organisms: Secondary | ICD-10-CM | POA: Diagnosis present

## 2021-11-07 DIAGNOSIS — J440 Chronic obstructive pulmonary disease with acute lower respiratory infection: Secondary | ICD-10-CM | POA: Diagnosis present

## 2021-11-07 DIAGNOSIS — E8809 Other disorders of plasma-protein metabolism, not elsewhere classified: Secondary | ICD-10-CM | POA: Diagnosis not present

## 2021-11-07 DIAGNOSIS — Z7901 Long term (current) use of anticoagulants: Secondary | ICD-10-CM | POA: Diagnosis not present

## 2021-11-07 DIAGNOSIS — Z20822 Contact with and (suspected) exposure to covid-19: Secondary | ICD-10-CM | POA: Diagnosis present

## 2021-11-07 DIAGNOSIS — K529 Noninfective gastroenteritis and colitis, unspecified: Secondary | ICD-10-CM | POA: Diagnosis not present

## 2021-11-07 DIAGNOSIS — I10 Essential (primary) hypertension: Secondary | ICD-10-CM | POA: Diagnosis not present

## 2021-11-07 DIAGNOSIS — J9601 Acute respiratory failure with hypoxia: Secondary | ICD-10-CM | POA: Diagnosis not present

## 2021-11-07 DIAGNOSIS — E860 Dehydration: Secondary | ICD-10-CM

## 2021-11-07 DIAGNOSIS — E039 Hypothyroidism, unspecified: Secondary | ICD-10-CM | POA: Diagnosis present

## 2021-11-07 DIAGNOSIS — R062 Wheezing: Secondary | ICD-10-CM

## 2021-11-07 DIAGNOSIS — R918 Other nonspecific abnormal finding of lung field: Secondary | ICD-10-CM

## 2021-11-07 DIAGNOSIS — E876 Hypokalemia: Secondary | ICD-10-CM | POA: Diagnosis not present

## 2021-11-07 DIAGNOSIS — E871 Hypo-osmolality and hyponatremia: Secondary | ICD-10-CM | POA: Diagnosis not present

## 2021-11-07 DIAGNOSIS — T502X5A Adverse effect of carbonic-anhydrase inhibitors, benzothiadiazides and other diuretics, initial encounter: Secondary | ICD-10-CM | POA: Diagnosis not present

## 2021-11-07 DIAGNOSIS — Z923 Personal history of irradiation: Secondary | ICD-10-CM

## 2021-11-07 DIAGNOSIS — Z86718 Personal history of other venous thrombosis and embolism: Secondary | ICD-10-CM

## 2021-11-07 DIAGNOSIS — J441 Chronic obstructive pulmonary disease with (acute) exacerbation: Secondary | ICD-10-CM | POA: Diagnosis present

## 2021-11-07 DIAGNOSIS — Z8249 Family history of ischemic heart disease and other diseases of the circulatory system: Secondary | ICD-10-CM

## 2021-11-07 DIAGNOSIS — M1990 Unspecified osteoarthritis, unspecified site: Secondary | ICD-10-CM | POA: Diagnosis present

## 2021-11-07 DIAGNOSIS — R0600 Dyspnea, unspecified: Secondary | ICD-10-CM | POA: Diagnosis not present

## 2021-11-07 DIAGNOSIS — R911 Solitary pulmonary nodule: Secondary | ICD-10-CM | POA: Diagnosis present

## 2021-11-07 DIAGNOSIS — C3412 Malignant neoplasm of upper lobe, left bronchus or lung: Secondary | ICD-10-CM | POA: Diagnosis present

## 2021-11-07 DIAGNOSIS — R059 Cough, unspecified: Secondary | ICD-10-CM

## 2021-11-07 DIAGNOSIS — K449 Diaphragmatic hernia without obstruction or gangrene: Secondary | ICD-10-CM | POA: Diagnosis present

## 2021-11-07 DIAGNOSIS — R0602 Shortness of breath: Secondary | ICD-10-CM | POA: Diagnosis not present

## 2021-11-07 DIAGNOSIS — J9811 Atelectasis: Secondary | ICD-10-CM | POA: Diagnosis not present

## 2021-11-07 DIAGNOSIS — M8580 Other specified disorders of bone density and structure, unspecified site: Secondary | ICD-10-CM | POA: Diagnosis present

## 2021-11-07 DIAGNOSIS — I517 Cardiomegaly: Secondary | ICD-10-CM | POA: Diagnosis not present

## 2021-11-07 DIAGNOSIS — Z79890 Hormone replacement therapy (postmenopausal): Secondary | ICD-10-CM

## 2021-11-07 DIAGNOSIS — E878 Other disorders of electrolyte and fluid balance, not elsewhere classified: Secondary | ICD-10-CM | POA: Diagnosis present

## 2021-11-07 DIAGNOSIS — Z66 Do not resuscitate: Secondary | ICD-10-CM | POA: Diagnosis present

## 2021-11-07 DIAGNOSIS — Z9049 Acquired absence of other specified parts of digestive tract: Secondary | ICD-10-CM

## 2021-11-07 DIAGNOSIS — E785 Hyperlipidemia, unspecified: Secondary | ICD-10-CM | POA: Diagnosis present

## 2021-11-07 DIAGNOSIS — I8290 Acute embolism and thrombosis of unspecified vein: Secondary | ICD-10-CM | POA: Diagnosis present

## 2021-11-07 DIAGNOSIS — Z86711 Personal history of pulmonary embolism: Secondary | ICD-10-CM

## 2021-11-07 DIAGNOSIS — Z853 Personal history of malignant neoplasm of breast: Secondary | ICD-10-CM

## 2021-11-07 DIAGNOSIS — J123 Human metapneumovirus pneumonia: Principal | ICD-10-CM | POA: Diagnosis present

## 2021-11-07 DIAGNOSIS — J90 Pleural effusion, not elsewhere classified: Secondary | ICD-10-CM | POA: Diagnosis not present

## 2021-11-07 HISTORY — DX: Other nonspecific abnormal finding of lung field: R91.8

## 2021-11-07 HISTORY — DX: Other pulmonary embolism without acute cor pulmonale: I26.99

## 2021-11-07 LAB — RESPIRATORY PANEL BY PCR

## 2021-11-07 LAB — CBC
HCT: 46.5 % — ABNORMAL HIGH (ref 36.0–46.0)
Hemoglobin: 15.7 g/dL — ABNORMAL HIGH (ref 12.0–15.0)
MCH: 31 pg (ref 26.0–34.0)
MCHC: 33.8 g/dL (ref 30.0–36.0)
MCV: 91.9 fL (ref 80.0–100.0)
Platelets: 268 10*3/uL (ref 150–400)
RBC: 5.06 MIL/uL (ref 3.87–5.11)
RDW: 13.4 % (ref 11.5–15.5)
WBC: 7.4 10*3/uL (ref 4.0–10.5)
nRBC: 0 % (ref 0.0–0.2)

## 2021-11-07 LAB — URINALYSIS, ROUTINE W REFLEX MICROSCOPIC
Bilirubin Urine: NEGATIVE
Glucose, UA: NEGATIVE mg/dL
Ketones, ur: 15 mg/dL — AB
Leukocytes,Ua: NEGATIVE
Nitrite: NEGATIVE
Protein, ur: NEGATIVE mg/dL
Specific Gravity, Urine: 1.015 (ref 1.005–1.030)
pH: 7 (ref 5.0–8.0)

## 2021-11-07 LAB — COMPREHENSIVE METABOLIC PANEL
ALT: 9 U/L (ref 0–44)
AST: 20 U/L (ref 15–41)
Albumin: 3.4 g/dL — ABNORMAL LOW (ref 3.5–5.0)
Alkaline Phosphatase: 52 U/L (ref 38–126)
Anion gap: 9 (ref 5–15)
BUN: 8 mg/dL (ref 8–23)
CO2: 34 mmol/L — ABNORMAL HIGH (ref 22–32)
Calcium: 9.3 mg/dL (ref 8.9–10.3)
Chloride: 93 mmol/L — ABNORMAL LOW (ref 98–111)
Creatinine, Ser: 0.53 mg/dL (ref 0.44–1.00)
GFR, Estimated: >60 mL/min (ref >60)
Glucose, Bld: 114 mg/dL — ABNORMAL HIGH (ref 70–99)
Potassium: 2.9 mmol/L — ABNORMAL LOW (ref 3.5–5.1)
Sodium: 136 mmol/L (ref 135–145)
Total Bilirubin: 0.8 mg/dL (ref 0.3–1.2)
Total Protein: 6.5 g/dL (ref 6.5–8.1)

## 2021-11-07 LAB — URINALYSIS, MICROSCOPIC (REFLEX)

## 2021-11-07 LAB — BRAIN NATRIURETIC PEPTIDE: B Natriuretic Peptide: 80.7 pg/mL (ref 0.0–100.0)

## 2021-11-07 LAB — LIPASE, BLOOD: Lipase: 32 U/L (ref 11–51)

## 2021-11-07 LAB — RESP PANEL BY RT-PCR (FLU A&B, COVID) ARPGX2
Influenza A by PCR: NEGATIVE
Influenza B by PCR: NEGATIVE
SARS Coronavirus 2 by RT PCR: NEGATIVE

## 2021-11-07 LAB — MAGNESIUM: Magnesium: 1.8 mg/dL (ref 1.7–2.4)

## 2021-11-07 LAB — PROCALCITONIN: Procalcitonin: <0.1 ng/mL

## 2021-11-07 LAB — TROPONIN I (HIGH SENSITIVITY): Troponin I (High Sensitivity): 7 ng/L (ref <18)

## 2021-11-07 LAB — STREP PNEUMONIAE URINARY ANTIGEN: Strep Pneumo Urinary Antigen: NEGATIVE

## 2021-11-07 MED ORDER — LEVOTHYROXINE SODIUM 50 MCG PO TABS
75.0000 ug | ORAL_TABLET | Freq: Every day | ORAL | Status: DC
  Administered 2021-11-08 – 2021-11-16 (×9): 75 ug via ORAL
  Filled 2021-11-07 (×9): qty 1

## 2021-11-07 MED ORDER — ORAL CARE MOUTH RINSE
15.0000 mL | Freq: Two times a day (BID) | OROMUCOSAL | Status: DC
  Administered 2021-11-07 – 2021-11-16 (×14): 15 mL via OROMUCOSAL

## 2021-11-07 MED ORDER — IPRATROPIUM-ALBUTEROL 0.5-2.5 (3) MG/3ML IN SOLN
RESPIRATORY_TRACT | Status: AC
  Filled 2021-11-07: qty 3

## 2021-11-07 MED ORDER — AZITHROMYCIN 250 MG PO TABS
500.0000 mg | ORAL_TABLET | Freq: Every day | ORAL | Status: AC
  Administered 2021-11-07 – 2021-11-11 (×5): 500 mg via ORAL
  Filled 2021-11-07 (×5): qty 2

## 2021-11-07 MED ORDER — APIXABAN 5 MG PO TABS
5.0000 mg | ORAL_TABLET | Freq: Two times a day (BID) | ORAL | Status: DC
  Administered 2021-11-07 – 2021-11-16 (×19): 5 mg via ORAL
  Filled 2021-11-07 (×4): qty 1
  Filled 2021-11-07: qty 2
  Filled 2021-11-07 (×14): qty 1

## 2021-11-07 MED ORDER — POTASSIUM CHLORIDE CRYS ER 20 MEQ PO TBCR
20.0000 meq | EXTENDED_RELEASE_TABLET | Freq: Two times a day (BID) | ORAL | Status: DC
  Administered 2021-11-07 – 2021-11-16 (×18): 20 meq via ORAL
  Filled 2021-11-07 (×19): qty 1

## 2021-11-07 MED ORDER — APIXABAN 5 MG PO TABS: 5.0000 mg | ORAL_TABLET | Freq: Two times a day (BID) | ORAL | Status: DC

## 2021-11-07 MED ORDER — ACETAMINOPHEN 325 MG PO TABS
650.0000 mg | ORAL_TABLET | Freq: Once | ORAL | Status: AC
  Administered 2021-11-07: 650 mg via ORAL
  Filled 2021-11-07: qty 2

## 2021-11-07 MED ORDER — METHYLPREDNISOLONE SODIUM SUCC 125 MG IJ SOLR
125.0000 mg | INTRAMUSCULAR | Status: AC
  Administered 2021-11-07: 125 mg via INTRAVENOUS
  Filled 2021-11-07: qty 2

## 2021-11-07 MED ORDER — LACTATED RINGERS IV BOLUS
500.0000 mL | Freq: Once | INTRAVENOUS | Status: AC
Start: 2021-11-07 — End: 2021-11-07
  Administered 2021-11-07: 500 mL via INTRAVENOUS

## 2021-11-07 MED ORDER — SODIUM CHLORIDE 0.9 % IV SOLN: INTRAVENOUS | Status: DC

## 2021-11-07 MED ORDER — BENZONATATE 100 MG PO CAPS
100.0000 mg | ORAL_CAPSULE | Freq: Three times a day (TID) | ORAL | Status: DC | PRN
  Administered 2021-11-08 – 2021-11-13 (×5): 100 mg via ORAL
  Filled 2021-11-07 (×5): qty 1

## 2021-11-07 MED ORDER — MAGNESIUM SULFATE 2 GM/50ML IV SOLN
2.0000 g | Freq: Once | INTRAVENOUS | Status: AC
  Administered 2021-11-07: 2 g via INTRAVENOUS
  Filled 2021-11-07: qty 50

## 2021-11-07 MED ORDER — HYDROCHLOROTHIAZIDE 25 MG PO TABS
25.0000 mg | ORAL_TABLET | Freq: Every day | ORAL | Status: DC
  Administered 2021-11-07 – 2021-11-10 (×4): 25 mg via ORAL
  Filled 2021-11-07 (×4): qty 1

## 2021-11-07 MED ORDER — GUAIFENESIN ER 600 MG PO TB12: 600.0000 mg | ORAL_TABLET | Freq: Two times a day (BID) | ORAL | Status: DC

## 2021-11-07 MED ORDER — IPRATROPIUM-ALBUTEROL 0.5-2.5 (3) MG/3ML IN SOLN
3.0000 mL | RESPIRATORY_TRACT | Status: AC
  Administered 2021-11-07: 3 mL via RESPIRATORY_TRACT
  Filled 2021-11-07: qty 3

## 2021-11-07 MED ORDER — LACTATED RINGERS IV BOLUS
500.0000 mL | Freq: Once | INTRAVENOUS | Status: AC
  Administered 2021-11-07: 500 mL via INTRAVENOUS

## 2021-11-07 MED ORDER — IPRATROPIUM-ALBUTEROL 0.5-2.5 (3) MG/3ML IN SOLN
3.0000 mL | Freq: Four times a day (QID) | RESPIRATORY_TRACT | Status: DC
  Administered 2021-11-07 – 2021-11-08 (×3): 3 mL via RESPIRATORY_TRACT
  Filled 2021-11-07 (×3): qty 3

## 2021-11-07 MED ORDER — IPRATROPIUM-ALBUTEROL 0.5-2.5 (3) MG/3ML IN SOLN: 3.0000 mL | Freq: Four times a day (QID) | RESPIRATORY_TRACT | Status: DC | PRN

## 2021-11-07 MED ORDER — PREDNISONE 20 MG PO TABS
40.0000 mg | ORAL_TABLET | Freq: Every day | ORAL | Status: DC
  Administered 2021-11-08: 40 mg via ORAL
  Filled 2021-11-07: qty 2

## 2021-11-07 MED ORDER — ACETAMINOPHEN 325 MG PO TABS
650.0000 mg | ORAL_TABLET | Freq: Four times a day (QID) | ORAL | Status: DC | PRN
  Administered 2021-11-12: 650 mg via ORAL
  Filled 2021-11-07: qty 2

## 2021-11-07 MED ORDER — IPRATROPIUM-ALBUTEROL 0.5-2.5 (3) MG/3ML IN SOLN
3.0000 mL | Freq: Once | RESPIRATORY_TRACT | Status: AC
  Administered 2021-11-07: 3 mL via RESPIRATORY_TRACT

## 2021-11-07 MED ORDER — PROSIGHT PO TABS
1.0000 | ORAL_TABLET | Freq: Every day | ORAL | Status: DC
  Administered 2021-11-07 – 2021-11-16 (×10): 1 via ORAL
  Filled 2021-11-07 (×10): qty 1

## 2021-11-07 MED ORDER — ACETAMINOPHEN 650 MG RE SUPP: 650.0000 mg | Freq: Four times a day (QID) | RECTAL | Status: DC | PRN

## 2021-11-07 MED ORDER — SODIUM CHLORIDE 0.9 % IV SOLN
2.0000 g | Freq: Every day | INTRAVENOUS | Status: DC
  Administered 2021-11-07 – 2021-11-08 (×2): 2 g via INTRAVENOUS
  Filled 2021-11-07 (×2): qty 20

## 2021-11-07 MED ORDER — POTASSIUM CHLORIDE CRYS ER 20 MEQ PO TBCR
40.0000 meq | EXTENDED_RELEASE_TABLET | Freq: Once | ORAL | Status: AC
Start: 2021-11-07 — End: 2021-11-07
  Administered 2021-11-07: 40 meq via ORAL
  Filled 2021-11-07: qty 2

## 2021-11-07 MED ORDER — GUAIFENESIN ER 600 MG PO TB12
600.0000 mg | ORAL_TABLET | Freq: Two times a day (BID) | ORAL | Status: DC
  Administered 2021-11-07 – 2021-11-09 (×4): 600 mg via ORAL
  Filled 2021-11-07 (×4): qty 1

## 2021-11-07 NOTE — Progress Notes (Signed)
Plan of Care Note for accepted transfer ? ? ?Patient: Ariel Medina MRN: [id number]   [place]: 11/07/2021 ? ?Facility requesting transfer: [facility] ?Requesting Provider: Marcellina Millin ?Reason for transfer: Hypoxia, COPD exacerbation? ?Facility course: 88 yo F presenting with 1 week sinus congestion/pressure/cough. 86% on RA. S/p duonebs, steroids, 2L [place]. Hx of PE on eliquis. ? ?Plan of care: ?The patient is accepted for admission to Telemetry unit, at [facility]. While holding at [facility], medical decision making for this patient remains the responsibility of the EDP. Upon arrival to [facility], [facility] will assume care. Thank you.  ? ?Author: ?Jonnie Finner, DO ?11/07/2021 ? ?Check [email or web address] for on-call coverage. ? ?Nursing staff, Please call [place] number on Amion as soon as patient's arrival, so appropriate admitting provider can evaluate the pt. ? ?

## 2021-11-07 NOTE — ED Provider Notes (Signed)
?[facility] EMERGENCY DEPARTMENT ?Provider Note ? ? ?CSN: [id number] ?Arrival date & time: 11/07/21  0857 ? ?  ? ?History ? ?Chief Complaint  ?Patient presents with  ? Cough  ? ? ?Ariel Medina is a 88 y.o. female. ? ? ?Cough ? ?Patient is a 88 year old female with a past medical history significant for lung cancer, breast cancer, PE on Eliquis which she is compliant with although she has not taken a dose today.  Hyperlipidemia, intussusception status post ? ?Patient has been coughing for the past 1 week she has had some sinus congestion and sinus pressure.  Cough is keeping her up at night.  She has not had any hemoptysis she denies any chest pain but states that she feels that she is working hard to breathe.  She states that she feels short of breath at rest now over the past 24 hours.  Previous to this has been mostly exertional.  She does not feel particularly more short of breath when she is lying down. ? ?She has a history of PE and is on Eliquis she is taking this as prescribed.  No missed doses.  No fevers at home.  No urinary frequency urgency dysuria hematuria.  No fevers denies any abdominal pain.  She has had diarrhea since she had a partial colectomy in December.  She states that her diarrhea has been more significant over the past few weeks and over the past 1 week has been more frequent and she is now having diarrhea anytime she eats which is prompting her to not eat as well. ? ?  ? ?Home Medications ?Prior to Admission medications   ?Medication Sig Start Date End Date Taking? Authorizing Provider  ?cyclobenzaprine (FLEXERIL) 5 MG tablet Take 1 tablet (5 mg total) by mouth 2 (two) times daily as needed for muscle spasms. 07/15/20   Colon Branch, MD  ?Arne Cleveland 5 MG TABS tablet TAKE 1 TABLET TWICE DAILY 08/10/21   Colon Branch, MD  ?feeding supplement, ENSURE ENLIVE, (ENSURE ENLIVE) LIQD Take 237 mLs by mouth 2 (two) times daily between meals. 05/12/20   Danford, Suann Larry, MD  ?fluticasone  (FLONASE) 50 MCG/ACT nasal spray Place 2 sprays into both nostrils daily as needed for allergies or rhinitis. 05/26/20   [name], [name], NP  ?hydrochlorothiazide (HYDRODIURIL) 25 MG tablet Take 1 tablet (25 mg total) by mouth daily. 03/16/21   Colon Branch, MD  ?levothyroxine (SYNTHROID) 75 MCG tablet TAKE 1 TABLET ONE TIME DAILY BEFORE BREAKFAST 06/22/21   Colon Branch, MD  ?Multiple Vitamins-Minerals (PRESERVISION AREDS 2 PO) Take 1 tablet by mouth in the morning and at bedtime.    [provider]  ?potassium chloride (KLOR-CON) 10 MEQ tablet Take 1 tablet (10 mEq total) by mouth daily. ?Patient not taking: Reported on 08/14/2021 12/03/20   Colon Branch, MD  ?   ? ?Allergies    ?Ibuprofen   ? ?Review of Systems   ?Review of Systems  ?Respiratory:  Positive for cough.   ? ?Physical Exam ?Updated Vital Signs ?BP (!) 161/88   Pulse 80   Temp 98.7 ?F (37.1 ?C) (Oral)   Resp (!) 24   SpO2 96%  ?Physical Exam ?Vitals and nursing note reviewed.  ?Constitutional:   ?   General: She is not in acute distress. ?HENT:  ?   Head: Normocephalic and atraumatic.  ?   Nose: Nose normal.  ?   Mouth/Throat:  ?   Mouth: Mucous membranes  are dry.  ?Eyes:  ?   General: No scleral icterus. ?Cardiovascular:  ?   Rate and Rhythm: Normal rate and regular rhythm.  ?   Pulses: Normal pulses.  ?   Heart sounds: Normal heart sounds.  ?Pulmonary:  ?   Effort: Pulmonary effort is normal. No respiratory distress.  ?   Breath sounds: Wheezing present.  ?   Comments: Wheezing with slightly prolonged expiratory phase.  Speaking in full sentences.  Not tachypneic. ?Coarse lung sounds throughout in addition to the wheezing.  Coughs occasionally ?Abdominal:  ?   Palpations: Abdomen is soft.  ?   Tenderness: There is no abdominal tenderness.  ?Musculoskeletal:  ?   Cervical back: Normal range of motion.  ?   Right lower leg: Edema present.  ?   Left lower leg: Edema present.  ?   Comments: Scant lower extremity edema at the ankle which is  symmetric with no calf tenderness  ?Skin: ?   General: Skin is warm and dry.  ?   Capillary Refill: Capillary refill takes less than 2 seconds.  ?Neurological:  ?   Mental Status: She is alert. Mental status is at baseline.  ?Psychiatric:     ?   Mood and Affect: Mood normal.     ?   Behavior: Behavior normal.  ? ? ?ED Results / Procedures / Treatments   ?Labs ?(all labs ordered are listed, but only abnormal results are displayed) ?Labs Reviewed  ?COMPREHENSIVE METABOLIC PANEL - Abnormal; Notable for the following components:  ?    Result Value  ? Potassium 2.9 (*)   ? Chloride 93 (*)   ? CO2 34 (*)   ? Glucose, Bld 114 (*)   ? Albumin 3.4 (*)   ? All other components within normal limits  ?CBC - Abnormal; Notable for the following components:  ? Hemoglobin 15.7 (*)   ? HCT 46.5 (*)   ? All other components within normal limits  ?RESP PANEL BY RT-PCR (FLU A&B, COVID) ARPGX2  ?LIPASE, BLOOD  ?BRAIN NATRIURETIC PEPTIDE  ?MAGNESIUM  ?URINALYSIS, ROUTINE W REFLEX MICROSCOPIC  ?TROPONIN I (HIGH SENSITIVITY)  ? ? ?EKG ?None ? ?Radiology ?DG Chest 2 View ? ?Result Date: 11/07/2021 ?CLINICAL DATA:  Cough and shortness of breath. EXAM: CHEST - 2 VIEW COMPARISON:  04/29/2020 FINDINGS: 0936 hours The cardio pericardial silhouette is enlarged. Left apical soft tissue lesion again noted, better characterized on CT chest 08/13/2021. Left base collapse/consolidation is similar to prior with probable small left effusion. Interstitial markings are diffusely coarsened with chronic features. Bones are diffusely demineralized. Telemetry leads overlie the chest. IMPRESSION: Stable exam. Left apical lesion with left base collapse/consolidation with probable small left effusion. Electronically Signed   By: Misty Stanley M.D.   On: 11/07/2021 10:07   ? ?Procedures ?Marland KitchenCritical Care ?Performed by: Tedd Sias, PA ?Authorized by: Tedd Sias, PA  ? ?Critical care provider statement:  ?  Critical care time (minutes):  35 ?  Critical  care time was exclusive of:  Separately billable procedures and treating other patients and teaching time ?  Critical care was necessary to treat or prevent imminent or life-threatening deterioration of the following conditions:  Respiratory failure ?  Critical care was time spent personally by me on the following activities:  Development of treatment plan with patient or surrogate, review of old charts, re-evaluation of patient's condition, pulse oximetry, ordering and review of radiographic studies, ordering and review of laboratory studies, ordering and performing  treatments and interventions, obtaining history from patient or surrogate, examination of patient and evaluation of patient's response to treatment ?  Care discussed with: admitting provider    ? ? ?Medications Ordered in ED ?Medications  ?apixaban (ELIQUIS) tablet 5 mg (5 mg Oral Given 11/07/21 0945)  ?magnesium sulfate IVPB 2 g 50 mL (2 g Intravenous New Bag/Given 11/07/21 1051)  ?ipratropium-albuterol (DUONEB) 0.5-2.5 (3) MG/3ML nebulizer solution 3 mL (3 mLs Nebulization Given 11/07/21 0943)  ?methylPREDNISolone sodium succinate (SOLU-MEDROL) 125 mg/2 mL injection 125 mg (125 mg Intravenous Given 11/07/21 0939)  ?lactated ringers bolus 500 mL (500 mLs Intravenous New Bag/Given 11/07/21 0943)  ?acetaminophen (TYLENOL) tablet 650 mg (650 mg Oral Given 11/07/21 0939)  ?ipratropium-albuterol (DUONEB) 0.5-2.5 (3) MG/3ML nebulizer solution 3 mL (3 mLs Nebulization Given 11/07/21 1011)  ?potassium chloride SA (KLOR-CON M) CR tablet 40 mEq (40 mEq Oral Given 11/07/21 1052)  ? ? ?ED Course/ Medical Decision Making/ A&P ?Clinical Course as of 11/07/21 1141  ?Sat Nov 07, 2021  ?1110 Patient desaturated to 88% when oxygen was taken off.  Slow to return to 94+ but responded to 4 L nasal cannula within 4 minutes [WF]  ?  ?Clinical Course User Index ?[WF] Tedd Sias, [place]  ? ?                        ?Medical Decision Making ?Amount and/or Complexity of Data Reviewed ?Labs:  ordered. ?Radiology: ordered. ? ?Risk ?OTC drugs. ?Prescription drug management. ? ? ?This patient presents to the ED for concern of cough, shortness of breath, weakness/fatigue, diarrhea, this involves a number of treatm

## 2021-11-07 NOTE — H&P (Signed)
?History and Physical  ? ? ?Patient: Ariel Medina GQB:[id number] DOB: 30-Apr-1928 ?DOA: 11/07/2021 ?DOS: the patient was seen and examined on 11/07/2021 ?PCP: Colon Branch, MD  ?Patient coming from: Home ? ?Chief Complaint:  ?Chief Complaint  ?Patient presents with  ? Cough  ? ?HPI: Ariel Medina is a 88 y.o. female with medical history significant of HTN, BrCA, pulm nodule, PE/DVT on eliquis, hypothyroidism, HTN, chronic diarrhea. Presenting with cough, shortness of breath. She's had 1 week of cough and shortness of breath. She has not had any fevers. She's had several sick contacts in the home. She tried mucinex and OTC meds to help, but they didn't. Her PO intake has been worse since the start of these symptoms. She has chronic diarrhea after a colectomy. She feels dehydrated because of the diarrhea and poor intake. When her symptoms didn't improve this morning, she decided to come to the ED for assistance. She denies any other aggravating or alleviating factors.   ? ?Review of Systems: As mentioned in the history of present illness. All other systems reviewed and are negative. ?Past Medical History:  ?Diagnosis Date  ? Anemia   ? Breast CA ([place])   ? left breast-invasive ductal ca stage 1-stopped arimidex 2008  ? Diverticulitis of colon   ? GI bleed 04/2020  ? Hiatal hernia   ? History of radiation therapy 08/05/2020  ? 54 Gray in 3 fractions to the left upper lung nodule 07/29/2020-08/05/2020.   Dr Gery Pray  ? Hyperlipidemia   ? Hypertension   ? Hypothyroidism   ? Osteoarthritis   ? Osteopenia   ? DEXA 7/02  ? Syncope 2005  ? CT head (-), ECHO essent. neg, stress test (-), carotid u/s (-)  ? ?Past Surgical History:  ?Procedure Laterality Date  ? BREAST BIOPSY Bilateral   ? BREAST LUMPECTOMY  04/2002  ? left  ? PARTIAL COLECTOMY N/A 05/01/2020  ? Procedure: PARTIAL COLECTOMY;  Surgeon: Coralie Keens, MD;  Location: [facility];  Service: General;  Laterality: N/A;  ? TONSILLECTOMY AND ADENOIDECTOMY    ? ?Social  History:  reports that she has never smoked. She has never used smokeless tobacco. She reports that she does not drink alcohol and does not use drugs. ? ?Allergies  ?Allergen Reactions  ? Ibuprofen   ?  REACTION: bleed  ? ? ?Family History  ?Problem Relation Age of Onset  ? Heart attack Brother 93  ? Diabetes Mother   ? Stroke Father 46  ? Stroke Sister 55  ? Colon cancer Neg Hx   ? Breast cancer Neg Hx   ? ? ?Prior to Admission medications   ?Medication Sig Start Date End Date Taking? Authorizing Provider  ?cyclobenzaprine (FLEXERIL) 5 MG tablet Take 1 tablet (5 mg total) by mouth 2 (two) times daily as needed for muscle spasms. 07/15/20   Colon Branch, MD  ?Arne Cleveland 5 MG TABS tablet TAKE 1 TABLET TWICE DAILY 08/10/21   Colon Branch, MD  ?feeding supplement, ENSURE ENLIVE, (ENSURE ENLIVE) LIQD Take 237 mLs by mouth 2 (two) times daily between meals. 05/12/20   Danford, Suann Larry, MD  ?fluticasone (FLONASE) 50 MCG/ACT nasal spray Place 2 sprays into both nostrils daily as needed for allergies or rhinitis. 05/26/20   [name], [name], NP  ?hydrochlorothiazide (HYDRODIURIL) 25 MG tablet Take 1 tablet (25 mg total) by mouth daily. 03/16/21   Colon Branch, MD  ?levothyroxine (SYNTHROID) 75 MCG tablet TAKE 1 TABLET ONE TIME DAILY  BEFORE BREAKFAST 06/22/21   Colon Branch, MD  ?Multiple Vitamins-Minerals (PRESERVISION AREDS 2 PO) Take 1 tablet by mouth in the morning and at bedtime.    [provider]  ?potassium chloride (KLOR-CON) 10 MEQ tablet Take 1 tablet (10 mEq total) by mouth daily. ?Patient not taking: Reported on 08/14/2021 12/03/20   Colon Branch, MD  ? ? ?Physical Exam: ?Vitals:  ? 11/07/21 1215 11/07/21 1234 11/07/21 1300 11/07/21 1415  ?BP: (!) 154/84 (!) 154/84 140/76 124/83  ?Pulse: 79 79 75 77  ?Resp: (!) 27 (!) 26 16 18   ?Temp:    98.1 ?F (36.7 ?C)  ?TempSrc:    Oral  ?SpO2: 95%  95% 95%  ? ?General: 88 y.o. female resting in bed in NAD ?Eyes: PERRL, normal sclera ?ENMT: Nares patent w/o discharge,  orophaynx clear, dentition normal, ears w/o discharge/lesions/ulcers ?Neck: Supple, trachea midline ?Cardiovascular: RRR, +S1, S2, no m/g/r, equal pulses throughout ?Respiratory: LLL rhonchi, slightly increased WOB on 2L ?GI: BS+, NDNT, no masses noted, no organomegaly noted ?MSK: No e/c/c ?Skin: No rashes, bruises, ulcerations noted ?Neuro: A&O x 3, no focal deficits ?Psyc: Appropriate interaction and affect, calm/cooperative ? ?Data Reviewed: ? ?K+  2.9 ?CO2  34 ?Hgb 15.7 ? ?CXR: Stable exam. Left apical lesion with left base collapse/consolidation with probable small left effusion. ? ?Assessment and Plan: ?No notes have been filed under this hospital service. ?Service: Hospitalist ?Acute hypoxic respiratory failure ?Cough ?    - place in obs, tele ?    - ED started steroid, nebs ?    - CXR as above, the left apical lesion is known and followed; the left effusion was previously noted as well. It is unclear if the base also has a consolidation. Check procal. She does not have fever and white count is normal; but her lung exam is more rhonchus than wheezy; start CAP coverage, can d/c if procal is negative ?    - check RVP as well ?    - incentive spirometry, FV ? ?Hypokalemia ?    - replace K+; Mg2+ is ok ? ?Dehydration ?Chronic diarrhea ?    - she has chronic diarrhea after her colectomy ?    - she reports that her PO intake has been worse since having the cough ?    - fluids; cholestyramine?  ? ?Hx of PE/DVT on anticoagulation ?    - continue eliquis ? ?Left lung apical lesion ?    - chronic, followed w/ pulm, rad/onc; continue follow up ? ?HTN ?    - continue home regimen ? ?Hypothyroidism ?    - continue home regimen ? ? Advance Care Planning:   Code Status: DNI ? ?Consults: None ? ?Family Communication: w/ family at bedside ? ?Severity of Illness: ?The appropriate patient status for this patient is OBSERVATION. Observation status is judged to be reasonable and necessary in order to provide the required  intensity of service to ensure the patient's safety. The patient's presenting symptoms, physical exam findings, and initial radiographic and laboratory data in the context of their medical condition is felt to place them at decreased risk for further clinical deterioration. Furthermore, it is anticipated that the patient will be medically stable for discharge from the hospital within 2 midnights of admission.  ? ?Author: ?Jonnie Finner, DO ?11/07/2021 2:19 PM ? ?For on call review [email or web address].  ?

## 2021-11-07 NOTE — ED Notes (Signed)
[facility] arrived for Pt. Given bedside report.  ?

## 2021-11-07 NOTE — ED Notes (Signed)
Pts O2 87% on RA upon arrival. Pt was placed on 2L at that time. ?

## 2021-11-07 NOTE — ED Triage Notes (Signed)
Pt reports cough and headache. Denies fevers, n/v. ?

## 2021-11-07 NOTE — Plan of Care (Signed)
?  Problem: Education: ?Goal: Knowledge of General Education information will improve ?Description: Including pain rating scale, medication(s)/side effects and non-pharmacologic comfort measures ?Outcome: Progressing ?  ?Problem: Health Behavior/Discharge Planning: ?Goal: Ability to manage health-related needs will improve ?Outcome: Progressing ?  ?Problem: Clinical Measurements: ?Goal: Ability to maintain clinical measurements within normal limits will improve ?Outcome: Progressing ?Goal: Will remain free from infection ?Outcome: Progressing ?Goal: Diagnostic test results will improve ?Outcome: Progressing ?Goal: Respiratory complications will improve ?Outcome: Progressing ?Goal: Cardiovascular complication will be avoided ?Outcome: Progressing ?  ?Problem: Activity: ?Goal: Risk for activity intolerance will decrease ?Outcome: Progressing ?  ?Problem: Nutrition: ?Goal: Adequate nutrition will be maintained ?Outcome: Progressing ?  ?Problem: Coping: ?Goal: Level of anxiety will decrease ?Outcome: Progressing ?  ?Problem: Elimination: ?Goal: Will not experience complications related to bowel motility ?Outcome: Progressing ?Goal: Will not experience complications related to urinary retention ?Outcome: Progressing ?  ?Problem: Pain Managment: ?Goal: General experience of comfort will improve ?Outcome: Progressing ?  ?Problem: Safety: ?Goal: Ability to remain free from injury will improve ?Outcome: Progressing ?  ?Problem: Skin Integrity: ?Goal: Risk for impaired skin integrity will decrease ?Outcome: Progressing ?  ?Problem: Education: ?Goal: Knowledge of disease or condition will improve ?Outcome: Progressing ?Goal: Knowledge of the prescribed therapeutic regimen will improve ?Outcome: Progressing ?Goal: Individualized Educational Video(s) ?Outcome: Progressing ?  ?Problem: Activity: ?Goal: Ability to tolerate increased activity will improve ?Outcome: Progressing ?Goal: Will verbalize the importance of balancing  activity with adequate rest periods ?Outcome: Progressing ?  ?Problem: Respiratory: ?Goal: Ability to maintain a clear airway will improve ?Outcome: Progressing ?Goal: Levels of oxygenation will improve ?Outcome: Progressing ?Goal: Ability to maintain adequate ventilation will improve ?Outcome: Progressing ?  ?Problem: Activity: ?Goal: Ability to tolerate increased activity will improve ?Outcome: Progressing ?  ?Problem: Clinical Measurements: ?Goal: Ability to maintain a body temperature in the normal range will improve ?Outcome: Progressing ?  ?Problem: Respiratory: ?Goal: Ability to maintain adequate ventilation will improve ?Outcome: Progressing ?Goal: Ability to maintain a clear airway will improve ?Outcome: Progressing ?  ?

## 2021-11-07 NOTE — ED Notes (Signed)
Back from radiology SpO2 88% with good pleth, placed on 2lpm O2 SpO2 95% after. ?

## 2021-11-08 ENCOUNTER — Encounter (HOSPITAL_COMMUNITY): Payer: Self-pay | Admitting: Family Medicine

## 2021-11-08 ENCOUNTER — Observation Stay (HOSPITAL_COMMUNITY): Payer: Medicare HMO

## 2021-11-08 DIAGNOSIS — M8580 Other specified disorders of bone density and structure, unspecified site: Secondary | ICD-10-CM | POA: Diagnosis present

## 2021-11-08 DIAGNOSIS — J208 Acute bronchitis due to other specified organisms: Secondary | ICD-10-CM | POA: Diagnosis present

## 2021-11-08 DIAGNOSIS — Z79899 Other long term (current) drug therapy: Secondary | ICD-10-CM | POA: Diagnosis not present

## 2021-11-08 DIAGNOSIS — R0602 Shortness of breath: Secondary | ICD-10-CM | POA: Diagnosis not present

## 2021-11-08 DIAGNOSIS — E785 Hyperlipidemia, unspecified: Secondary | ICD-10-CM | POA: Diagnosis present

## 2021-11-08 DIAGNOSIS — R062 Wheezing: Secondary | ICD-10-CM | POA: Diagnosis present

## 2021-11-08 DIAGNOSIS — I10 Essential (primary) hypertension: Secondary | ICD-10-CM

## 2021-11-08 DIAGNOSIS — E860 Dehydration: Secondary | ICD-10-CM | POA: Diagnosis present

## 2021-11-08 DIAGNOSIS — J440 Chronic obstructive pulmonary disease with acute lower respiratory infection: Secondary | ICD-10-CM | POA: Diagnosis present

## 2021-11-08 DIAGNOSIS — E871 Hypo-osmolality and hyponatremia: Secondary | ICD-10-CM | POA: Diagnosis not present

## 2021-11-08 DIAGNOSIS — Z79890 Hormone replacement therapy (postmenopausal): Secondary | ICD-10-CM | POA: Diagnosis not present

## 2021-11-08 DIAGNOSIS — K529 Noninfective gastroenteritis and colitis, unspecified: Secondary | ICD-10-CM

## 2021-11-08 DIAGNOSIS — J123 Human metapneumovirus pneumonia: Secondary | ICD-10-CM | POA: Diagnosis not present

## 2021-11-08 DIAGNOSIS — E876 Hypokalemia: Secondary | ICD-10-CM

## 2021-11-08 DIAGNOSIS — T502X5A Adverse effect of carbonic-anhydrase inhibitors, benzothiadiazides and other diuretics, initial encounter: Secondary | ICD-10-CM | POA: Diagnosis not present

## 2021-11-08 DIAGNOSIS — E8809 Other disorders of plasma-protein metabolism, not elsewhere classified: Secondary | ICD-10-CM | POA: Diagnosis not present

## 2021-11-08 DIAGNOSIS — J441 Chronic obstructive pulmonary disease with (acute) exacerbation: Secondary | ICD-10-CM | POA: Diagnosis present

## 2021-11-08 DIAGNOSIS — R059 Cough, unspecified: Secondary | ICD-10-CM | POA: Diagnosis not present

## 2021-11-08 DIAGNOSIS — R918 Other nonspecific abnormal finding of lung field: Secondary | ICD-10-CM

## 2021-11-08 DIAGNOSIS — I517 Cardiomegaly: Secondary | ICD-10-CM | POA: Diagnosis not present

## 2021-11-08 DIAGNOSIS — E878 Other disorders of electrolyte and fluid balance, not elsewhere classified: Secondary | ICD-10-CM | POA: Diagnosis present

## 2021-11-08 DIAGNOSIS — J9601 Acute respiratory failure with hypoxia: Principal | ICD-10-CM | POA: Diagnosis present

## 2021-11-08 DIAGNOSIS — E039 Hypothyroidism, unspecified: Secondary | ICD-10-CM

## 2021-11-08 DIAGNOSIS — M1990 Unspecified osteoarthritis, unspecified site: Secondary | ICD-10-CM | POA: Diagnosis present

## 2021-11-08 DIAGNOSIS — C3412 Malignant neoplasm of upper lobe, left bronchus or lung: Secondary | ICD-10-CM | POA: Diagnosis present

## 2021-11-08 DIAGNOSIS — Z7901 Long term (current) use of anticoagulants: Secondary | ICD-10-CM | POA: Diagnosis not present

## 2021-11-08 DIAGNOSIS — Z20822 Contact with and (suspected) exposure to covid-19: Secondary | ICD-10-CM | POA: Diagnosis present

## 2021-11-08 DIAGNOSIS — Z66 Do not resuscitate: Secondary | ICD-10-CM | POA: Diagnosis present

## 2021-11-08 DIAGNOSIS — K449 Diaphragmatic hernia without obstruction or gangrene: Secondary | ICD-10-CM | POA: Diagnosis present

## 2021-11-08 LAB — COMPREHENSIVE METABOLIC PANEL
ALT: 9 U/L (ref 0–44)
AST: 18 U/L (ref 15–41)
Albumin: 2.9 g/dL — ABNORMAL LOW (ref 3.5–5.0)
Alkaline Phosphatase: 45 U/L (ref 38–126)
Anion gap: 7 (ref 5–15)
BUN: 10 mg/dL (ref 8–23)
CO2: 34 mmol/L — ABNORMAL HIGH (ref 22–32)
Calcium: 8.9 mg/dL (ref 8.9–10.3)
Chloride: 93 mmol/L — ABNORMAL LOW (ref 98–111)
Creatinine, Ser: 0.59 mg/dL (ref 0.44–1.00)
GFR, Estimated: >60 mL/min (ref >60)
Glucose, Bld: 100 mg/dL — ABNORMAL HIGH (ref 70–99)
Potassium: 3.3 mmol/L — ABNORMAL LOW (ref 3.5–5.1)
Sodium: 134 mmol/L — ABNORMAL LOW (ref 135–145)
Total Bilirubin: 0.4 mg/dL (ref 0.3–1.2)
Total Protein: 5.7 g/dL — ABNORMAL LOW (ref 6.5–8.1)

## 2021-11-08 LAB — CBC
HCT: 56.6 % — ABNORMAL HIGH (ref 36.0–46.0)
Hemoglobin: 18.8 g/dL — ABNORMAL HIGH (ref 12.0–15.0)
MCH: 30.5 pg (ref 26.0–34.0)
MCHC: 33.2 g/dL (ref 30.0–36.0)
MCV: 91.9 fL (ref 80.0–100.0)
Platelets: 146 10*3/uL — ABNORMAL LOW (ref 150–400)
RBC: 6.16 MIL/uL — ABNORMAL HIGH (ref 3.87–5.11)
RDW: 13.6 % (ref 11.5–15.5)
WBC: 9.3 10*3/uL (ref 4.0–10.5)
nRBC: 0 % (ref 0.0–0.2)

## 2021-11-08 MED ORDER — METHYLPREDNISOLONE SODIUM SUCC 40 MG IJ SOLR
40.0000 mg | Freq: Two times a day (BID) | INTRAMUSCULAR | Status: DC
  Administered 2021-11-08 – 2021-11-09 (×3): 40 mg via INTRAVENOUS
  Filled 2021-11-08 (×3): qty 1

## 2021-11-08 MED ORDER — IPRATROPIUM-ALBUTEROL 0.5-2.5 (3) MG/3ML IN SOLN: 3.0000 mL | Freq: Two times a day (BID) | RESPIRATORY_TRACT | Status: DC

## 2021-11-08 MED ORDER — IPRATROPIUM-ALBUTEROL 0.5-2.5 (3) MG/3ML IN SOLN
3.0000 mL | Freq: Four times a day (QID) | RESPIRATORY_TRACT | Status: DC
  Administered 2021-11-08 – 2021-11-10 (×9): 3 mL via RESPIRATORY_TRACT
  Filled 2021-11-08 (×9): qty 3

## 2021-11-08 NOTE — Assessment & Plan Note (Signed)
-   Continue Eliquis 

## 2021-11-08 NOTE — Assessment & Plan Note (Signed)
-   Patient has history of primary malignant neoplasm of left upper lobe of lung ?-She was seen by radiation oncology, underwent radiation treatments ?-Not a candidate for chemotherapy ?-Did not undergo biopsy ?-Last CT scan from January 2023 showed interval increase in size of lung mass, no further intervention recommended by radiation oncology ?-Follow-up radiation oncology as outpatient ?

## 2021-11-08 NOTE — Assessment & Plan Note (Addendum)
-   Patient has chronic diarrhea after colectomy ?-GI consulted, started on Imodium with improvement ?

## 2021-11-08 NOTE — Assessment & Plan Note (Deleted)
-   Continue Eliquis 

## 2021-11-08 NOTE — Hospital Course (Signed)
88 year old female with history of hypertension, breast cancer, pulmonary nodule, PE/DVT on Eliquis, hypothyroidism, hypertension, chronic diarrhea presented with cough and shortness of breath for 1 week.  Denies any fever.  Tried over-the-counter medications with no help.  Came to ED, found to be in acute hypoxemic respiratory failure, requiring 4 L/min of oxygen. ?

## 2021-11-08 NOTE — Progress Notes (Addendum)
[facility] ? ?PROGRESS NOTE ? ?SULLY MANZI JJO:[id number] DOB: [birth year] DOA: 11/07/2021 ?PCP: Colon Branch, MD ? ?Brief hospital course ? ?88 year old female with history of hypertension, breast cancer, pulmonary nodule, PE/DVT on Eliquis, hypothyroidism, hypertension, chronic diarrhea presented with cough and shortness of breath for 1 week.  Denies any fever.  Tried over-the-counter medications with no help.  Came to ED, found to be in acute hypoxemic respiratory failure, requiring 4 L/min of oxygen.  ? ? ? ?Subjective  ? ?Patient continues to have shortness of breath, tachypneic.  No previous history of COPD.  Denies smoking cigarettes. ? ?  ? ?Assessment and Plan: ? ?* Acute respiratory failure with hypoxia (HCC) ?- Unclear etiology ??  Underlying COPD versus acute bronchitis ?-Procalcitonin less than 0.10, ceftriaxone will be discontinued ?-Continue Zithromax ?-DuoNeb nebulizer every 6 hours ?-Change prednisone to Solu-Medrol 40 mg IV every 12 hours ?-Continue Mucinex 600 mg p.o. twice daily ?-BNP 80.7 ?-We will repeat chest x-ray ? ? ?Lung mass ?- Patient has history of primary malignant neoplasm of left upper lobe of lung ?-She was seen by radiation oncology, underwent radiation treatments ?-Not a candidate for chemotherapy ?-Did not undergo biopsy ?-Last CT scan from January 2023 showed interval increase in size of lung mass, no further intervention recommended by radiation oncology ?-Follow-up radiation oncology as outpatient ? ?Chronic diarrhea ?- Patient has chronic diarrhea after colectomy ? ?VTE (venous thromboembolism) ?- Continue Eliquis ? ?Essential hypertension ?- Continue HCTZ 25 mg daily ? ?Hypothyroidism ?- Continue Synthroid ? ? ? ? ?  ?Medications ? ?  ? apixaban  5 mg Oral BID  ? azithromycin  500 mg Oral Daily  ? guaiFENesin  600 mg Oral BID  ? hydrochlorothiazide  25 mg Oral Daily  ? ipratropium-albuterol  3 mL Nebulization Q6H  ? levothyroxine  75 mcg Oral Q0600  ? mouth rinse  15 mL  Mouth Rinse BID  ? methylPREDNISolone (SOLU-MEDROL) injection  40 mg Intravenous Q12H  ? multivitamin  1 tablet Oral Daily  ? potassium chloride  20 mEq Oral BID  ? ? ? Data Reviewed:  ? ?CBG: ? ?No results for input(s): GLUCAP in the last 168 hours. ? ?SpO2: 94 % ?O2 Flow Rate (L/min): 4 L/min  ? ? ?Vitals:  ? 11/08/21 1109 11/08/21 1128 11/08/21 1238 11/08/21 1427  ?BP: 134/82  126/75 120/74  ?Pulse: 92  94 88  ?Resp: (!) 32  (!) 32 (!) 32  ?Temp: 99.1 ?F (37.3 ?C)  98.5 ?F (36.9 ?C) 98.2 ?F (36.8 ?C)  ?TempSrc: Oral  Oral Oral  ?SpO2: 94% (!) 86% 94% 94%  ?Weight:      ?Height:      ? ? ? ? ?Data Reviewed: ? ?Basic Metabolic Panel: ?Recent Labs  ?Lab 11/07/21 ?0928 11/08/21 ?0350  ?NA 136 134*  ?K 2.9* 3.3*  ?CL 93* 93*  ?CO2 34* 34*  ?GLUCOSE 114* 100*  ?BUN 8 10  ?CREATININE 0.53 0.59  ?CALCIUM 9.3 8.9  ?MG 1.8  --   ? ? ?CBC: ?Recent Labs  ?Lab 11/07/21 ?0928 11/08/21 ?0350  ?WBC 7.4 9.3  ?HGB 15.7* 18.8*  ?HCT 46.5* 56.6*  ?MCV 91.9 91.9  ?PLT 268 146*  ? ? ?LFT ?Recent Labs  ?Lab 11/07/21 ?0928 11/08/21 ?0350  ?AST 20 18  ?ALT 9 9  ?ALKPHOS 52 45  ?BILITOT 0.8 0.4  ?PROT 6.5 5.7*  ?ALBUMIN 3.4* 2.9*  ? ?  ?Micro :  ? ? ? ?Antibiotics: ?Anti-infectives (From admission,  onward)  ? ? Start     Dose/Rate Route Frequency Ordered Stop  ? 11/07/21 1700  cefTRIAXone (ROCEPHIN) 2 g in sodium chloride 0.9 % 100 mL IVPB  Status:  Discontinued       ? 2 g ?200 mL/hr over 30 Minutes Intravenous Daily 11/07/21 1553 11/08/21 1141  ? 11/07/21 1645  azithromycin (ZITHROMAX) tablet 500 mg       ? 500 mg Oral Daily 11/07/21 1553 11/12/21 0959  ? ?  ? ? ? ? ? ?DVT prophylaxis: Apixaban ? ?Code Status: Full code ? ?Family Communication: No family at bedside ? ?CONSULTS: ? ? ? ?Objective  ? ? ?Physical Examination: ? ? ?General: Appears in mild respiratory distress ?Cardiovascular: S1-S2, regular, no murmur auscultated ?Respiratory: Bilateral rhonchi auscultated ?Abdomen: Abdomen is soft, nontender, no organomegaly ?Extremities:  No edema in the lower extremities ?Neurologic: Cranial nerves II through XII grossly intact, no focal deficit noted, alert oriented x3 ? ? ?Status is: Inpatient: Acute hypoxemic respiratory failure ? ? ? ?  ? ? ? ? ? ?[name] ?  ?[facility] ?If 7PM-7AM, please contact night-coverage at [email or web address], ?Office  [phone] ? ? ?11/08/2021, 2:54 PM  LOS: 0 days  ? ? ? ? ? ? ? ? ? ? ?  ?

## 2021-11-08 NOTE — Assessment & Plan Note (Signed)
Continue Synthroid °

## 2021-11-08 NOTE — Progress Notes (Signed)
Patient denies pain, N/V/D. ? ? 11/08/21 1109  ?Assess: MEWS Score  ?Temp 99.1 ?F (37.3 ?C)  ?BP 134/82  ?Pulse Rate 92  ?Resp (!) 32  ?Level of Consciousness Alert  ?SpO2 94 %  ?O2 Device Nasal Cannula  ?O2 Flow Rate (L/min) 2 L/min  ?Assess: MEWS Score  ?MEWS Temp 0  ?MEWS Systolic 0  ?MEWS Pulse 0  ?MEWS RR 2  ?MEWS LOC 0  ?MEWS Score 2  ?MEWS Score Color Yellow  ?Assess: if the MEWS score is Yellow or Red  ?Were vital signs taken at a resting state? Yes  ?Focused Assessment No change from prior assessment  ?Does the patient meet 2 or more of the SIRS criteria? Yes  ?Does the patient have a confirmed or suspected source of infection? Yes  ?Provider and Rapid Response Notified? No ?(MD notified)  ?MEWS guidelines implemented *See Row Information* Yes  ?Take Vital Signs  ?Increase Vital Sign Frequency  Yellow: Q 2hr X 2 then Q 4hr X 2, if remains yellow, continue Q 4hrs  ?Escalate  ?MEWS: Escalate Yellow: discuss with charge nurse/RN and consider discussing with provider and RRT  ?Notify: Charge Nurse/RN  ?Name of Charge Nurse/RN Notified Brien Mates, RN  ?Date Charge Nurse/RN Notified 11/08/21  ?Time Charge Nurse/RN Notified 1111  ?Notify: Provider  ?Provider Name/Title Eleonore Chiquito, MD  ?Date Provider Notified 11/08/21  ?Time Provider Notified [phone]  ?Notification Type  ?(secure chat)  ?Notification Reason Change in status  ?Assess: SIRS CRITERIA  ?SIRS Temperature  0  ?SIRS Pulse 1  ?SIRS Respirations  1  ?SIRS WBC 0  ?SIRS Score Sum  2  ? ? ?

## 2021-11-08 NOTE — Assessment & Plan Note (Addendum)
?    Underlying COPD versus acute bronchitis ?-Procalcitonin less than 0.10, ceftriaxone was discontinued ?-Continued Zithromax, start date 11/07/21, d/c 11/11/21 ?-DuoNeb nebulizer every 6 hours ?-IV Solu-Medrol 40 mg every 12 hours was changed to prednisone 40 mg daily  ?-Continue Mucinex to 1200 mg p.o. twice daily  ?-BNP 80.7, respiratory panel showed metapneumovirus ?-Chest x-ray shows atelectasis versus pneumonia at lung bases ?-Flutter valve every 4 hours ?-She was started on Pulmicort nebulizer twice daily ?-Appreciate pulmonary input, they recommend discharging home on oxygen and then follow-up pulmonology as outpatient ?-On 4/5 oxygen requirement went up to 11 L/min of oxygen via high flow nasal cannula, started Lasix 20 mg IV every 12 hours with excellent diuresis, diuresed well over 5 L on IV Lasix, Lasix was d/c ?-Oxygen requirement has come down to 3 to 4 L/min HFNC, still desatting on 2L O2 via [place] w/ ambulation ?-PT recommending likely needs SNF if there are significant barriers to providing O2 at home, [facility] will work on home oxygen, hopefully patient can be discharged home tomorrow, 4/10, if continues to be stable ?

## 2021-11-08 NOTE — Assessment & Plan Note (Addendum)
-   Patient is currently on HCTZ 25 mg daily ?-HCTZ was cut down to to 12.5 mg daily due to hyponatremia and hypokalemia ?-Also started on  amlodipine 5 mg daily ?

## 2021-11-09 ENCOUNTER — Encounter: Payer: Self-pay | Admitting: Internal Medicine

## 2021-11-09 DIAGNOSIS — I10 Essential (primary) hypertension: Secondary | ICD-10-CM | POA: Diagnosis not present

## 2021-11-09 DIAGNOSIS — E876 Hypokalemia: Secondary | ICD-10-CM | POA: Diagnosis not present

## 2021-11-09 DIAGNOSIS — J9601 Acute respiratory failure with hypoxia: Secondary | ICD-10-CM | POA: Diagnosis not present

## 2021-11-09 DIAGNOSIS — K529 Noninfective gastroenteritis and colitis, unspecified: Secondary | ICD-10-CM | POA: Diagnosis not present

## 2021-11-09 LAB — BASIC METABOLIC PANEL
Anion gap: 7 (ref 5–15)
BUN: 14 mg/dL (ref 8–23)
CO2: 30 mmol/L (ref 22–32)
Calcium: 8.7 mg/dL — ABNORMAL LOW (ref 8.9–10.3)
Chloride: 94 mmol/L — ABNORMAL LOW (ref 98–111)
Creatinine, Ser: 0.51 mg/dL (ref 0.44–1.00)
GFR, Estimated: >60 mL/min (ref >60)
Glucose, Bld: 153 mg/dL — ABNORMAL HIGH (ref 70–99)
Potassium: 4 mmol/L (ref 3.5–5.1)
Sodium: 131 mmol/L — ABNORMAL LOW (ref 135–145)

## 2021-11-09 LAB — LEGIONELLA PNEUMOPHILA SEROGP 1 UR AG: L. pneumophila Serogp 1 Ur Ag: NEGATIVE

## 2021-11-09 MED ORDER — PREDNISONE 20 MG PO TABS
40.0000 mg | ORAL_TABLET | Freq: Every day | ORAL | Status: DC
  Administered 2021-11-10 – 2021-11-16 (×7): 40 mg via ORAL
  Filled 2021-11-09 (×7): qty 2

## 2021-11-09 MED ORDER — LOPERAMIDE HCL 2 MG PO CAPS
4.0000 mg | ORAL_CAPSULE | Freq: Every day | ORAL | Status: DC
  Administered 2021-11-09 – 2021-11-16 (×5): 4 mg via ORAL
  Filled 2021-11-09 (×7): qty 2

## 2021-11-09 MED ORDER — GUAIFENESIN ER 600 MG PO TB12
1200.0000 mg | ORAL_TABLET | Freq: Two times a day (BID) | ORAL | Status: DC
  Administered 2021-11-09 – 2021-11-16 (×14): 1200 mg via ORAL
  Filled 2021-11-09 (×14): qty 2

## 2021-11-09 NOTE — Plan of Care (Signed)
Ariel Medina has been up in the chair since breakfast.  She has ambulated in the room to the BR several times.  Her O2 has been weaned from [place] to [employer]. Ambulatory O2 sat on RA was 85% so remains on 1lnc. She has had several episodes of diarrhea that she reports seem to come on after eating. Dr Darrick Meigs aware and GI consulted.  ?Problem: Education: ?Goal: Knowledge of General Education information will improve ?Description: Including pain rating scale, medication(s)/side effects and non-pharmacologic comfort measures ?Outcome: Progressing ?  ?Problem: Health Behavior/Discharge Planning: ?Goal: Ability to manage health-related needs will improve ?Outcome: Progressing ?  ?Problem: Clinical Measurements: ?Goal: Ability to maintain clinical measurements within normal limits will improve ?Outcome: Progressing ?Goal: Will remain free from infection ?Outcome: Progressing ?Note: +metapneumovirus ?Goal: Diagnostic test results will improve ?Outcome: Progressing ?Goal: Respiratory complications will improve ?Outcome: Progressing ?Note: Weaned from [place] to [place] ?Goal: Cardiovascular complication will be avoided ?Outcome: Progressing ?  ?Problem: Activity: ?Goal: Risk for activity intolerance will decrease ?Outcome: Progressing ?  ?Problem: Elimination: ?Goal: Will not experience complications related to bowel motility ?Outcome: Progressing ?Note: Chronic diarrhea. GI consulted.  ?  ?Problem: Safety: ?Goal: Ability to remain free from injury will improve ?Outcome: Progressing ?  ?Problem: Skin Integrity: ?Goal: Risk for impaired skin integrity will decrease ?Outcome: Progressing ?  ?Problem: Education: ?Goal: Knowledge of disease or condition will improve ?Outcome: Progressing ?Goal: Knowledge of the prescribed therapeutic regimen will improve ?Outcome: Progressing ?Goal: Individualized Educational Video(s) ?Outcome: Progressing ?  ?Problem: Activity: ?Goal: Ability to tolerate increased activity will improve ?Outcome:  Progressing ?Goal: Will verbalize the importance of balancing activity with adequate rest periods ?Outcome: Progressing ?  ?Problem: Respiratory: ?Goal: Ability to maintain a clear airway will improve ?Outcome: Progressing ?Goal: Levels of oxygenation will improve ?Outcome: Progressing ?Goal: Ability to maintain adequate ventilation will improve ?Outcome: Progressing ?  ?Problem: Activity: ?Goal: Ability to tolerate increased activity will improve ?Outcome: Progressing ?  ?Problem: Respiratory: ?Goal: Ability to maintain adequate ventilation will improve ?Outcome: Progressing ?Goal: Ability to maintain a clear airway will improve ?Outcome: Progressing ?  ?

## 2021-11-09 NOTE — Consult Note (Addendum)
? ? ? Consultation ? ?Referring Provider:     Dr. Darrick Meigs, [facility] ?Primary Care Physician:  Colon Branch, MD ?Primary Gastroenterologist:       [place] [facility] formerly Dr. Olevia Perches ?Reason for Consultation:     Chronic diarrhea ?  ? ? Impression / Plan:  ?Chronic diarrhea since right hemicolectomy/small bowel resection for intussusception in 2021 ? ?Acute respiratory failure with hypoxia ? ?Enlarging lung mass consistent with lung cancer (diagnosed by PET scan treated with radiation therapy) ? ?Eliquis therapy for history of PE DVT ? ?Chronic partially calcified lesion anterior to the duodenum question in pancreas, negative on PET scanning x2 ? ? ?I think her problems are related to her hemicolectomy and loss of ileocecal valve.  She has a classic story for this.  Current acute illness and azithromycin  may exacerbate things but she says she has problems like this at home as well.  She has not tried any therapy for it before.  Cholestyramine or colestipol would likely work well but she has to take Eliquis twice a day and is on other medications and the bile acid sequestrant's can interfere with medication absorption.  Thus I will try two loperamide daily.  We will follow-up tomorrow we will arrange for some outpatient follow-up as well. ? ?I do not know that she has been reassessed as to the need for chronic anticoagulation that would certainly make treatment easier if I need to use cholestyramine or colestipol in her.  That is worth considering she was supposed to have follow-up with pulmonary in the fall 2022, but that has not happened. ? ?I appreciate the opportunity to care for this patient. ? ? ?Ariel Mayer, MD, Mar[facility] Regal ?[facility] ?11/09/2021 4:19 PM ? ? ? ? ?      ? HPI:   ?Ariel Medina is a 88 y.o. female admitted with acute respiratory failure and hypoxia question COPD flare with sputum positive for metapneumovirus who has chronic diarrhea ever since being admitted and having surgery for an intussusception  in the terminal ileum, in 2021.  She presented with diarrhea and bleeding and was diagnosed at CT scan and underwent resection by Dr. Coralie Keens.  Subsequently she has suffered with episodic intermittent diarrhea often watery.  She says she has recovered and gained weight since that surgery but is plagued by these issues.  Stools are often formed.  This has caused her a lot of problems she lives alone some of the time her grandson lives with her though he travels a lot and her sister lives next door.  This is limiting her quality of life and making it difficult for her to leave the house.  She had 2 episodes that were called "blow outs" today.  After breakfast and lunch.  She says this is very consistent with the symptoms and signs she has at home.  She is not describing bleeding issues.  There is no history of colitis.  Surgical pathology showed the lipoma at the lead point of the intussusception but no other abnormalities i.e. chronic illness in the intestine resection specimen.  There is a very remote history of colonoscopy and an ERCP with a colonoscopy in 2002 showing left-sided diverticulosis.  ERCP was normal in 2001.  She was a former Dr. Olevia Perches patient. ? ?She had seen Dr. Valeta Harms for the lung lesion and positive PET scan and given her age a radiation therapy approach was taken and she had partial regression but now CT scan is showing growth of  the lesion. ?Past Medical History:  ?Diagnosis Date  ? Anemia   ? Breast CA ([place])   ? left breast-invasive ductal ca stage 1-stopped arimidex 2008  ? Diverticulitis of colon   ? GI bleed 04/2020  ? Hiatal hernia   ? History of radiation therapy 08/05/2020  ? 56 Gray in 3 fractions to the left upper lung nodule 07/29/2020-08/05/2020.   Dr Gery Pray  ? Hyperlipidemia   ? Hypertension   ? Hypothyroidism   ? Lung mass   ? Osteoarthritis   ? Osteopenia   ? DEXA 7/02  ? Pulmonary embolism (HCC)/DVT   ? Syncope 2005  ? CT head (-), ECHO essent. neg, stress test (-),  carotid u/s (-)  ? ? ?Past Surgical History:  ?Procedure Laterality Date  ? BREAST BIOPSY Bilateral   ? BREAST LUMPECTOMY  04/2002  ? left  ? PARTIAL COLECTOMY N/A 05/01/2020  ? Procedure: PARTIAL COLECTOMY;  Surgeon: Coralie Keens, MD;  Location: [facility];  Service: General;  Laterality: N/A;  ? TONSILLECTOMY AND ADENOIDECTOMY    ? ? ?Family History  ?Problem Relation Age of Onset  ? Heart attack Brother 34  ? Diabetes Mother   ? Stroke Father 35  ? Stroke Sister 58  ? Colon cancer Neg Hx   ? Breast cancer Neg Hx   ?  ? ?Social History  ? ?Tobacco Use  ? Smoking status: Never  ? Smokeless tobacco: Never  ?Vaping Use  ? Vaping Use: Never used  ?Substance Use Topics  ? Alcohol use: No  ? Drug use: No  ? ? ?Prior to Admission medications   ?Medication Sig Start Date End Date Taking? Authorizing Provider  ?ELIQUIS 5 MG TABS tablet TAKE 1 TABLET TWICE DAILY 08/10/21  Yes Colon Branch, MD  ?hydrochlorothiazide (HYDRODIURIL) 25 MG tablet Take 1 tablet (25 mg total) by mouth daily. 03/16/21  Yes Colon Branch, MD  ?levothyroxine (SYNTHROID) 75 MCG tablet TAKE 1 TABLET ONE TIME DAILY BEFORE BREAKFAST 06/22/21  Yes Colon Branch, MD  ?Multiple Vitamins-Minerals (PRESERVISION AREDS) TABS Take 1 tablet by mouth daily.   Yes [provider]  ?potassium chloride (KLOR-CON) 10 MEQ tablet Take 1 tablet (10 mEq total) by mouth daily. ?Patient taking differently: Take 10 mEq by mouth once a week. 12/03/20  Yes Paz, Alda Berthold, MD  ?cyclobenzaprine (FLEXERIL) 5 MG tablet Take 1 tablet (5 mg total) by mouth 2 (two) times daily as needed for muscle spasms. ?Patient not taking: Reported on 11/07/2021 07/15/20   Colon Branch, MD  ?feeding supplement, ENSURE ENLIVE, (ENSURE ENLIVE) LIQD Take 237 mLs by mouth 2 (two) times daily between meals. ?Patient not taking: Reported on 11/07/2021 05/12/20   Edwin Dada, MD  ?fluticasone (FLONASE) 50 MCG/ACT nasal spray Place 2 sprays into both nostrils daily as needed for allergies or  rhinitis. ?Patient not taking: Reported on 11/07/2021 05/26/20   [name], Nelda Bucks, NP  ? ? ?Current Facility-Administered Medications  ?Medication Dose Route Frequency Provider Last Rate Last Admin  ? acetaminophen (TYLENOL) tablet 650 mg  650 mg Oral Q6H PRN Cherylann Ratel A, DO      ? Or  ? acetaminophen (TYLENOL) suppository 650 mg  650 mg Rectal Q6H PRN Marylyn Ishihara, Tyrone A, DO      ? apixaban (ELIQUIS) tablet 5 mg  5 mg Oral BID Marylyn Ishihara, Tyrone A, DO   5 mg at 11/09/21 0950  ? azithromycin (ZITHROMAX) tablet 500 mg  500 mg Oral Daily  Cherylann Ratel A, DO   500 mg at 11/09/21 0950  ? benzonatate (TESSALON) capsule 100 mg  100 mg Oral TID PRN Cherylann Ratel A, DO   100 mg at 11/08/21 1202  ? guaiFENesin (MUCINEX) 12 hr tablet 1,200 mg  1,200 mg Oral BID [name], MD      ? hydrochlorothiazide (HYDRODIURIL) tablet 25 mg  25 mg Oral Daily Kyle, Tyrone A, DO   25 mg at 11/09/21 0950  ? ipratropium-albuterol (DUONEB) 0.5-2.5 (3) MG/3ML nebulizer solution 3 mL  3 mL Nebulization Q6H [name], MD   3 mL at 11/09/21 1441  ? levothyroxine (SYNTHROID) tablet 75 mcg  75 mcg Oral Q0600 Cherylann Ratel A, DO   75 mcg at 11/09/21 0524  ? MEDLINE mouth rinse  15 mL Mouth Rinse BID Marylyn Ishihara, Tyrone A, DO   15 mL at 11/09/21 1610  ? multivitamin (PROSIGHT) tablet 1 tablet  1 tablet Oral Daily Kyle, Tyrone A, DO   1 tablet at 11/09/21 0950  ? potassium chloride SA (KLOR-CON M) CR tablet 20 mEq  20 mEq Oral BID Marylyn Ishihara, Tyrone A, DO   20 mEq at 11/09/21 0951  ? [START ON 11/10/2021] predniSONE (DELTASONE) tablet 40 mg  40 mg Oral Q breakfast [name], MD      ? ? ?Allergies as of 11/07/2021 - Review Complete 11/07/2021  ?Allergen Reaction Noted  ? Ibuprofen    ? ? ? ?Review of Systems:    ?This is positive for those things mentioned in the HPI, also positive for weakness, shortness of breath and cough.Marland Kitchen ?All other review of systems are negative.  ? ? ? ? ? Physical Exam:  ?Vital signs in last 24 hours: ?Temp:  [97.8 ?F (36.6 ?C)-98.2 ?F (36.8  ?C)] 98.2 ?F (36.8 ?C) (04/03 1514) ?Pulse Rate:  [73-86] 81 (04/03 1514) ?Resp:  [18-26] 18 (04/03 1514) ?BP: (130-139)/(76-89) 130/78 (04/03 1514) ?SpO2:  [90 %-97 %] 90 % (04/03 1514) ?Last BM Date : 11/08/21 ? ?G

## 2021-11-09 NOTE — Progress Notes (Signed)
[facility] ? ?PROGRESS NOTE ? ?Ariel Medina ZTI:[id number] DOB: 08/16/1927 DOA: 11/07/2021 ?PCP: Colon Branch, MD ? ?Brief hospital course ? ?88 year old female with history of hypertension, breast cancer, pulmonary nodule, PE/DVT on Eliquis, hypothyroidism, hypertension, chronic diarrhea presented with cough and shortness of breath for 1 week.  Denies any fever.  Tried over-the-counter medications with no help.  Came to ED, found to be in acute hypoxemic respiratory failure, requiring 4 L/min of oxygen.  ? ? ? ?Subjective  ? ?Patient seen and examined, anxious to go home.  Still requiring oxygen. ? ?  ? ?Assessment and Plan: ? ?* Acute respiratory failure with hypoxia (HCC) ?- Unclear etiology ??  Underlying COPD versus acute bronchitis ?-Procalcitonin less than 0.10, ceftriaxone will be discontinued ?-Continue Zithromax ?-DuoNeb nebulizer every 6 hours ?-Change prednisone to Solu-Medrol 40 mg IV every 12 hours ?-Continue Mucinex 600 mg p.o. twice daily ?-BNP 80.7 ?-We will repeat chest x-ray ? ? ?Lung mass ?- Patient has history of primary malignant neoplasm of left upper lobe of lung ?-She was seen by radiation oncology, underwent radiation treatments ?-Not a candidate for chemotherapy ?-Did not undergo biopsy ?-Last CT scan from January 2023 showed interval increase in size of lung mass, no further intervention recommended by radiation oncology ?-Follow-up radiation oncology as outpatient ? ?Chronic diarrhea ?- Patient has chronic diarrhea after colectomy ? ?VTE (venous thromboembolism) ?- Continue Eliquis ? ?Essential hypertension ?- Continue HCTZ 25 mg daily ? ?Hypothyroidism ?- Continue Synthroid ? ? ? ? ?  ?Medications ? ?  ? apixaban  5 mg Oral BID  ? azithromycin  500 mg Oral Daily  ? guaiFENesin  1,200 mg Oral BID  ? hydrochlorothiazide  25 mg Oral Daily  ? ipratropium-albuterol  3 mL Nebulization Q6H  ? levothyroxine  75 mcg Oral Q0600  ? mouth rinse  15 mL Mouth Rinse BID  ? multivitamin  1 tablet  Oral Daily  ? potassium chloride  20 mEq Oral BID  ? [START ON 11/10/2021] predniSONE  40 mg Oral Q breakfast  ? ? ? Data Reviewed:  ? ?CBG: ? ?No results for input(s): GLUCAP in the last 168 hours. ? ?SpO2: 95 % ?O2 Flow Rate (L/min): 2 L/min (weaned to [place])  ? ? ?Vitals:  ? 11/09/21 8250 11/09/21 0840 11/09/21 5397 11/09/21 1107  ?BP:   139/77   ?Pulse:   86   ?Resp: (!) 24   20  ?Temp:      ?TempSrc:      ?SpO2: 90% 90% 94% 95%  ?Weight:      ?Height:      ? ? ? ? ?Data Reviewed: ? ?Basic Metabolic Panel: ?Recent Labs  ?Lab 11/07/21 ?6734 11/08/21 ?0350 11/09/21 ?0329  ?NA 136 134* 131*  ?K 2.9* 3.3* 4.0  ?CL 93* 93* 94*  ?CO2 34* 34* 30  ?GLUCOSE 114* 100* 153*  ?BUN 8 10 14   ?CREATININE 0.53 0.59 0.51  ?CALCIUM 9.3 8.9 8.7*  ?MG 1.8  --   --   ? ? ?CBC: ?Recent Labs  ?Lab 11/07/21 ?0928 11/08/21 ?0350  ?WBC 7.4 9.3  ?HGB 15.7* 18.8*  ?HCT 46.5* 56.6*  ?MCV 91.9 91.9  ?PLT 268 146*  ? ? ?LFT ?Recent Labs  ?Lab 11/07/21 ?0928 11/08/21 ?0350  ?AST 20 18  ?ALT 9 9  ?ALKPHOS 52 45  ?BILITOT 0.8 0.4  ?PROT 6.5 5.7*  ?ALBUMIN 3.4* 2.9*  ? ?  ?Micro :  ? ? ? ?Antibiotics: ?Anti-infectives (From admission,  onward)  ? ? Start     Dose/Rate Route Frequency Ordered Stop  ? 11/07/21 1700  cefTRIAXone (ROCEPHIN) 2 g in sodium chloride 0.9 % 100 mL IVPB  Status:  Discontinued       ? 2 g ?200 mL/hr over 30 Minutes Intravenous Daily 11/07/21 1553 11/08/21 1141  ? 11/07/21 1645  azithromycin (ZITHROMAX) tablet 500 mg       ? 500 mg Oral Daily 11/07/21 1553 11/12/21 0959  ? ?  ? ? ? ? ? ?DVT prophylaxis: Apixaban ? ?Code Status: Full code ? ?Family Communication: No family at bedside ? ?CONSULTS: ? ? ? ?Objective  ? ? ?Physical Examination: ? ? ?General-appears in no acute distress ?Heart-S1-S2, regular, no murmur auscultated ?Lungs-bilateral rhonchi auscultated ?Abdomen-soft, nontender, no organomegaly ?Extremities-no edema in the lower extremities ?Neuro-alert, oriented x3, no focal deficit noted ? ? ?Status is: Inpatient:  Acute hypoxemic respiratory failure ? ? ? ?  ? ? ? ?[name] ?  ?[facility] ?If 7PM-7AM, please contact night-coverage at [email or web address], ?Office  [phone] ? ? ?11/09/2021, 2:32 PM  LOS: 1 day  ? ? ? ? ? ? ? ? ? ? ?  ?

## 2021-11-09 NOTE — Plan of Care (Signed)
?  Problem: Clinical Measurements: ?Goal: Will remain free from infection ?Outcome: Progressing ?Goal: Diagnostic test results will improve ?Outcome: Progressing ?Goal: Respiratory complications will improve ?Outcome: Progressing ?  ?

## 2021-11-09 NOTE — Progress Notes (Signed)
SATURATION QUALIFICATIONS: (This note is used to comply with regulatory documentation for home oxygen) ? ?Patient Saturations on Room Air at Rest = 93% ? ?Patient Saturations on Room Air while Ambulating = 85% ? ?Pt placed back on 1lnc and O2 sat increased to 93% ?

## 2021-11-09 NOTE — Plan of Care (Signed)
  Problem: Education: Goal: Knowledge of General Education information will improve Description: Including pain rating scale, medication(s)/side effects and non-pharmacologic comfort measures Outcome: Progressing   Problem: Clinical Measurements: Goal: Will remain free from infection Outcome: Progressing Goal: Diagnostic test results will improve Outcome: Progressing Goal: Respiratory complications will improve Outcome: Progressing   

## 2021-11-10 ENCOUNTER — Telehealth: Payer: Self-pay

## 2021-11-10 DIAGNOSIS — E871 Hypo-osmolality and hyponatremia: Secondary | ICD-10-CM

## 2021-11-10 DIAGNOSIS — E876 Hypokalemia: Secondary | ICD-10-CM | POA: Diagnosis not present

## 2021-11-10 DIAGNOSIS — J9601 Acute respiratory failure with hypoxia: Secondary | ICD-10-CM | POA: Diagnosis not present

## 2021-11-10 DIAGNOSIS — I10 Essential (primary) hypertension: Secondary | ICD-10-CM | POA: Diagnosis not present

## 2021-11-10 DIAGNOSIS — K529 Noninfective gastroenteritis and colitis, unspecified: Secondary | ICD-10-CM | POA: Diagnosis not present

## 2021-11-10 MED ORDER — PANTOPRAZOLE SODIUM 40 MG PO TBEC
40.0000 mg | DELAYED_RELEASE_TABLET | Freq: Every day | ORAL | Status: DC
  Administered 2021-11-10 – 2021-11-16 (×7): 40 mg via ORAL
  Filled 2021-11-10 (×7): qty 1

## 2021-11-10 MED ORDER — AMLODIPINE BESYLATE 5 MG PO TABS
5.0000 mg | ORAL_TABLET | Freq: Every day | ORAL | Status: DC
  Administered 2021-11-11 – 2021-11-16 (×6): 5 mg via ORAL
  Filled 2021-11-10 (×6): qty 1

## 2021-11-10 MED ORDER — HYDROCHLOROTHIAZIDE 12.5 MG PO TABS
12.5000 mg | ORAL_TABLET | Freq: Every day | ORAL | Status: DC
  Administered 2021-11-11 – 2021-11-16 (×6): 12.5 mg via ORAL
  Filled 2021-11-10 (×6): qty 1

## 2021-11-10 MED ORDER — IPRATROPIUM-ALBUTEROL 0.5-2.5 (3) MG/3ML IN SOLN: 3.0000 mL | Freq: Four times a day (QID) | RESPIRATORY_TRACT | Status: DC | PRN

## 2021-11-10 MED ORDER — BUDESONIDE 0.25 MG/2ML IN SUSP
0.2500 mg | Freq: Two times a day (BID) | RESPIRATORY_TRACT | Status: DC
  Administered 2021-11-10 – 2021-11-16 (×12): 0.25 mg via RESPIRATORY_TRACT
  Filled 2021-11-10 (×12): qty 2

## 2021-11-10 NOTE — Progress Notes (Addendum)
? ? ? ?Daily Progress Note ? ?Hospital Day: 4 ? ?Chief Complaint: chronic diarrhea ? ?Assessment  ? ?Brief History ?Ariel Medina is a 88 y.o. female with a pmh not limited to  breast cancer, HTN, HLD, HTN, DVT / PE on Eliquis,chronic partially calcified lesion anterior to the duodenum question in pancreas negative on PET scanning x2. GI history notable for chronic diarrhea since right hemicolectomy for intussusception in 2021, diverticular disease.  Admitted with acute respiratory failure / possible COPD flare. Sputum positive for metapneumonic. ? ?Chronic diarrhea since R hemicolectomy.  ? ?History of lung cancer in LUL, s/p radiation with regression in size but now enlarging on chest CT   ? ?3. Hypoalbuminemia. Weight seems to be stable.  ? ? ?Plan  ? ?While bile acid sequestrant would be ideal for her we opted to hold off on that given its potential to interfere with absorption of other medications.  Continue loperamide 4 mg daily. Should at some point she get off Eliquis then can certainly try cholestyramine ? ? [facility] Attending  ? ?Pt seen. As above she is improved. I discussed that she can take daily loperamide at dc but should titrate for effect and hold or reduce dose if getting constipated. ? ?She has a 4/27 f/u me. ? ?Signing off. ? ?Gatha Mayer, MD, [facility] ? ? ? ?Subjective  ? ?Imodium really helped. Hardly any diarrhea since taking a dose yesterday evening. Ate a good breakfast ? ?Objective  ? ?Imaging:  ? ?DG Chest 2 View ? ?Result Date: 11/07/2021 ?CLINICAL DATA:  Cough and shortness of breath. EXAM: CHEST - 2 VIEW COMPARISON:  04/29/2020 FINDINGS: 0936 hours The cardio pericardial silhouette is enlarged. Left apical soft tissue lesion again noted, better characterized on CT chest 08/13/2021. Left base collapse/consolidation is similar to prior with probable small left effusion. Interstitial markings are diffusely coarsened with chronic features. Bones are diffusely demineralized. Telemetry  leads overlie the chest. IMPRESSION: Stable exam. Left apical lesion with left base collapse/consolidation with probable small left effusion. Electronically Signed   By: Misty Stanley M.D.   On: 11/07/2021 10:07  ? ?DG Chest Port 1V same Day ? ?Result Date: 11/08/2021 ?CLINICAL DATA:  Cough, shortness of breath EXAM: PORTABLE CHEST 1 VIEW COMPARISON:  Previous studies including the examination of 11/07/2021 FINDINGS: Transverse diameter of heart is increased. There is increased density in the left lower lung fields suggesting interval increase in pleural effusion and possibly underlying atelectasis/pneumonia. There is increased density in the right lower lung fields suggesting increasing right pleural effusion and possibly underlying atelectasis. There are no signs of alveolar pulmonary edema. There is 2.3 cm nodular density in the left apex which may suggest pneumonia or malignant neoplasm. There is no pneumothorax. IMPRESSION: Cardiomegaly. Central pulmonary vessels are prominent without signs of alveolar pulmonary edema. There is increased opacification of both lower lung fields suggesting increase in bilateral pleural effusions and possibly underlying atelectasis/pneumonia. There is 2.3 cm nodular density in the left apex suggesting pneumonia or neoplasm. Electronically Signed   By: Elmer Picker M.D.   On: 11/08/2021 15:03   ? ?Lab Results: ?Recent Labs  ?  11/07/21 ?0928 11/08/21 ?0350  ?WBC 7.4 9.3  ?HGB 15.7* 18.8*  ?HCT 46.5* 56.6*  ?PLT 268 146*  ? ?BMET ?Recent Labs  ?  11/07/21 ?0928 11/08/21 ?0350 11/09/21 ?0329  ?NA 136 134* 131*  ?K 2.9* 3.3* 4.0  ?CL 93* 93* 94*  ?CO2 34* 34* 30  ?GLUCOSE 114* 100*  153*  ?BUN 8 10 14   ?CREATININE 0.53 0.59 0.51  ?CALCIUM 9.3 8.9 8.7*  ? ?LFT ?Recent Labs  ?  11/08/21 ?0350  ?PROT 5.7*  ?ALBUMIN 2.9*  ?AST 18  ?ALT 9  ?ALKPHOS 45  ?BILITOT 0.4  ? ?PT/INR ?No results for input(s): LABPROT, INR in the last 72 hours. ? ? ?Scheduled inpatient medications:  ? apixaban   5 mg Oral BID  ? azithromycin  500 mg Oral Daily  ? guaiFENesin  1,200 mg Oral BID  ? hydrochlorothiazide  25 mg Oral Daily  ? ipratropium-albuterol  3 mL Nebulization Q6H  ? levothyroxine  75 mcg Oral Q0600  ? loperamide  4 mg Oral Daily  ? mouth rinse  15 mL Mouth Rinse BID  ? multivitamin  1 tablet Oral Daily  ? potassium chloride  20 mEq Oral BID  ? predniSONE  40 mg Oral Q breakfast  ? ?Continuous inpatient infusions:  ?PRN inpatient medications: acetaminophen **OR** acetaminophen, benzonatate ? ?Vital signs in last 24 hours: ?Temp:  [98.1 ?F (36.7 ?C)-98.2 ?F (36.8 ?C)] 98.1 ?F (36.7 ?C) (04/04 0517) ?Pulse Rate:  [81-91] 82 (04/04 0517) ?Resp:  [18-22] 18 (04/04 0517) ?BP: (125-155)/(77-100) 155/91 (04/04 4037) ?SpO2:  [88 %-95 %] 91 % (04/04 0739) ?Last BM Date : 11/10/21 ? ?Intake/Output Summary (Last 24 hours) at 11/10/2021 0853 ?Last data filed at 11/10/2021 5436 ?Gross per 24 hour  ?Intake 1500 ml  ?Output 400 ml  ?Net 1100 ml  ? ?Physical Exam:  ?General: Alert female in NAD ?Heart:  Regular rate and rhythm. No lower extremity edema ?Pulmonary: Normal respiratory effort. Harsh cough, breath sounds diminished bilaterally ?Abdomen: Soft, nondistended, nontender. Normal bowel sounds.  ?Neurologic: Alert and oriented ?Psych: Pleasant. Cooperative.  ? ? ?Intake/Output from previous day: ?04/03 0701 - 04/04 0700 ?In: 1500 [P.O.:1500] ?Out: 400 [Urine:400] ?Intake/Output this shift: ?No intake/output data recorded. ? ? ?Principal Problem: ?  Acute respiratory failure with hypoxia ([place]) ?Active Problems: ?  Hypothyroidism ?  Hypokalemia ?  Essential hypertension ?  VTE (venous thromboembolism) ?  Solitary pulmonary nodule ?  Cough ?  Chronic diarrhea ?  Dehydration ?  Lung mass ?  Acute hypoxemic respiratory failure ([place]) ? ? ? LOS: 2 days  ? ?Tye Savoy ,NP 11/10/2021, 8:53 AM ? ? ? ? ? ? ?

## 2021-11-10 NOTE — Telephone Encounter (Signed)
-----   Message from Gatha Mayer, MD sent at 11/09/2021  4:05 PM EDT ----- ?Regarding: Office visit please ?Ariel Medina can you get this lady an appointment with me for about a month from now regarding chronic diarrhea I am seeing her in the hospital for this today ? ?Thanks ? ?

## 2021-11-10 NOTE — Progress Notes (Signed)
[facility] ? ?PROGRESS NOTE ? ?Ariel Medina TKZ:[id number] DOB: Jan 09, 1928 DOA: 11/07/2021 ?PCP: Ariel Branch, MD ? ?Brief hospital course ? ?88 year old female with history of hypertension, breast cancer, pulmonary nodule, PE/DVT on Eliquis, hypothyroidism, hypertension, chronic diarrhea presented with cough and shortness of breath for 1 week.  Denies any fever.  Tried over-the-counter medications with no help.  Came to ED, found to be in acute hypoxemic respiratory failure, requiring 4 L/min of oxygen.  ? ? ? ?Subjective  ? ?Patient seen and examined, still requiring 3 to 4 L of oxygen via nasal cannula.  Appreciate GI input for chronic diarrhea, diarrhea improved with Imodium. ? ?  ? ?Assessment and Plan: ? ?* Acute respiratory failure with hypoxia (HCC) ?- Unclear etiology ??  Underlying COPD versus acute bronchitis ?-Procalcitonin less than 0.10, ceftriaxone was discontinued ?-Continue Zithromax ?-DuoNeb nebulizer every 6 hours ?-IV Solu-Medrol 40 mg every 12 hours was changed to prednisone 40 mg daily from today ?-Continue Mucinex to 1200 mg p.o. twice daily  ?-BNP 80.7 ?-Repeat chest x-ray shows atelectasis versus pneumonia at lung bases ?-Flutter valve every 4 hours ?-Since patient is still requiring 3 to 4 L of oxygen via nasal cannula, will obtain pulmonology consultation ?-We will start Pulmicort nebulizer twice daily ? ? ?Hyponatremia ?- Sodium is 131 due to HCTZ ?-Dose of HCTZ changed to 12.5 mg daily ?-Follow BMP in am ? ?Lung mass ?- Patient has history of primary malignant neoplasm of left upper lobe of lung ?-She was seen by radiation oncology, underwent radiation treatments ?-Not a candidate for chemotherapy ?-Did not undergo biopsy ?-Last CT scan from January 2023 showed interval increase in size of lung mass, no further intervention recommended by radiation oncology ?-Follow-up radiation oncology as outpatient ? ?Chronic diarrhea ?- Patient has chronic diarrhea after colectomy ?-GI  consulted, started on Imodium with improvement ? ?VTE (venous thromboembolism) ?- Continue Eliquis ? ?Essential hypertension ?- Patient is currently on HCTZ 25 mg daily ?-We will cut down HCTZ to 12.5 mg daily due to hyponatremia and hypokalemia ?-Also start amlodipine 5 mg daily ? ?Hypothyroidism ?- Continue Synthroid ? ? ? ? ?  ?Medications ? ?  ? [START ON 11/11/2021] amLODipine  5 mg Oral Daily  ? apixaban  5 mg Oral BID  ? azithromycin  500 mg Oral Daily  ? budesonide (PULMICORT) nebulizer solution  0.25 mg Nebulization BID  ? guaiFENesin  1,200 mg Oral BID  ? [START ON 11/11/2021] hydrochlorothiazide  12.5 mg Oral Daily  ? ipratropium-albuterol  3 mL Nebulization Q6H  ? levothyroxine  75 mcg Oral Q0600  ? loperamide  4 mg Oral Daily  ? mouth rinse  15 mL Mouth Rinse BID  ? multivitamin  1 tablet Oral Daily  ? pantoprazole  40 mg Oral Daily  ? potassium chloride  20 mEq Oral BID  ? predniSONE  40 mg Oral Q breakfast  ? ? ? Data Reviewed:  ? ?CBG: ? ?No results for input(s): GLUCAP in the last 168 hours. ? ?SpO2: 93 % ?O2 Flow Rate (L/min): 4 L/min  ? ? ?Vitals:  ? 11/10/21 0739 11/10/21 0900 11/10/21 0925 11/10/21 1228  ?BP:    130/85  ?Pulse:    86  ?Resp:    (!) 24  ?Temp:    97.8 ?F (36.6 ?C)  ?TempSrc:      ?SpO2: 91% (!) 88% 91% 93%  ?Weight:      ?Height:      ? ? ? ? ?Data  Reviewed: ? ?Basic Metabolic Panel: ?Recent Labs  ?Lab 11/07/21 ?8242 11/08/21 ?0350 11/09/21 ?0329  ?NA 136 134* 131*  ?K 2.9* 3.3* 4.0  ?CL 93* 93* 94*  ?CO2 34* 34* 30  ?GLUCOSE 114* 100* 153*  ?BUN 8 10 14   ?CREATININE 0.53 0.59 0.51  ?CALCIUM 9.3 8.9 8.7*  ?MG 1.8  --   --   ? ? ?CBC: ?Recent Labs  ?Lab 11/07/21 ?0928 11/08/21 ?0350  ?WBC 7.4 9.3  ?HGB 15.7* 18.8*  ?HCT 46.5* 56.6*  ?MCV 91.9 91.9  ?PLT 268 146*  ? ? ?LFT ?Recent Labs  ?Lab 11/07/21 ?0928 11/08/21 ?0350  ?AST 20 18  ?ALT 9 9  ?ALKPHOS 52 45  ?BILITOT 0.8 0.4  ?PROT 6.5 5.7*  ?ALBUMIN 3.4* 2.9*  ? ?  ?Micro :  ? ? ? ?Antibiotics: ?Anti-infectives (From admission,  onward)  ? ? Start     Dose/Rate Route Frequency Ordered Stop  ? 11/07/21 1700  cefTRIAXone (ROCEPHIN) 2 g in sodium chloride 0.9 % 100 mL IVPB  Status:  Discontinued       ? 2 g ?200 mL/hr over 30 Minutes Intravenous Daily 11/07/21 1553 11/08/21 1141  ? 11/07/21 1645  azithromycin (ZITHROMAX) tablet 500 mg       ? 500 mg Oral Daily 11/07/21 1553 11/12/21 0959  ? ?  ? ? ? ? ? ?DVT prophylaxis: Apixaban ? ?Code Status: Full code ? ?Family Communication: No family at bedside ? ?CONSULTS: ? ? ? ?Objective  ? ? ?Physical Examination: ? ? ?General-appears in no acute distress ?Heart-S1-S2, regular, no murmur auscultated ?Lungs-bilateral rhonchi auscultated ?Abdomen-soft, nontender, no organomegaly ?Extremities-no edema in the lower extremities ?Neuro-alert, oriented x3, no focal deficit noted ? ?Status is: Inpatient: Acute hypoxemic respiratory failure ? ? ? ?  ? ? ? ?Ariel Medina ?  ?[facility] ?If 7PM-7AM, please contact night-coverage at [email or web address], ?Office  [phone] ? ? ?11/10/2021, 1:27 PM  LOS: 2 days  ? ? ? ? ? ? ? ? ? ? ?  ?

## 2021-11-10 NOTE — Assessment & Plan Note (Addendum)
-   Sodium was 132 due to HCTZ ?-Dose of HCTZ changed to 12.5 mg daily ?-Sodium improved to 136 ?

## 2021-11-10 NOTE — Consult Note (Addendum)
Attending Note ?Seen for persistent hypoxemia at request of Dr. Darrick Meigs. ? ?88 year old woman with nonresolving PET-avid LUL nodule who presents with URI symptoms found to have metapneumovirus.  With supportive care and steroids she is feeling better but still needing 3-4LPM O2 to maintain sats in mid 90s. ? ?Has productive cough but swallowing sputum so unclear what color.  No pleurisy.  No aspiration symptoms. ? ?On exam, no distress, some retained pharyngeal secretions causing transmitted upper airway sounds on auscultation. ? ?Overall I think this is just a viral pneumonia in patient with advanced age and slow aging of lungs (some UIP changes on CT).  Would get her a flutter valve, home O2, and will have Dr. Valeta Harms see her in a couple weeks. ? ?Available PRN ? ?Erskine Emery MD PCCM ? ? ? ? ?NAME:  Ariel Medina, MRN:  [id number], DOB:  Jan 08, 1928, LOS: 2 ?ADMISSION DATE:  11/07/2021, CONSULTATION DATE:  11/10/2021 ?REFERRING MD:  Dr. Darrick Meigs, CHIEF COMPLAINT:  SOB/ diarrhea  ? ?History of Present Illness:  ? ?88 year old female with PMH significant for never smoker, LUL nodule s/p SBRT, PE/ DVT on Eliquis and hiatal hernia, who presented on 4/1 with one week history of cough, sinus pressure and chest congestion, some exertional dyspnea, with poor PO with ongoing chronic diarrhea, slight worse of late.  Compliant with eliquis, no missed doses.  She lives with her adult grandson.  Was around family that had colds prior to feeling ill.  Denies any fever, difficulty swallowing, chest pain, hemoptysis, or vomiting.  Has had a productive cough but never looked at her sputum and just swallowed it, but since is resolving.  Also endorses frequent reflux symptoms but does not take any meds for it and chronic lower extremity swelling.  She is not on home oxygen.  ? ?She was last seen in our office 3/22 by Dr. Valeta Harms for a LUL apical lung nodule found in 11/21 on CTA study.  Felt to be early stage/ slow growing non-smell lung cancer as  tissue biopsy was deferred given patient's age, anticoagulation status, and started on empiric SBRT.  Nodule had decreased in size after SBRT x 3, but found to have enlargement of masslike consolidation on the anterior wall of left upper lobe- unclear if this was related to radiation changes or progressive malignancy.  Patient was not interested in aggressive therapies at that time.  LUL lung nodule still hypermetabolic on PET 4/23.  Was offered more radiation therapy which would be more involved but was deciding if she wanted to persue.  Underwent follow up CT in 08/2021 which showed enlarging mass in LUL, previously 2.4 x 1.7 then measuring 3.4 x 3.1cm and small left effusion and atelectasis.  No further plans for treatment.   ? ?On workup, she was afebrile and hypoxic to 87% on room air.  Normal WBC.  BNP 80.  Was empirically started on steroids, nebs, ceftriaxone and azithro.  CXR showing previous left upper lobe nodule, bilateral small pleural effusions, and atelectasis.  She was admitted to [facility] for hypoxic respiratory failure, dehydration 2/2 poor po intake and diarrhea, and hypokalemia.  Urine strep and legionella negative.  RVP positive for metapneumovirus, SARS/ flu neg.  Given neg PCT, normal WBC, and remaining afebrile, ceftriaxone was stopped. U  Patient still requiring nasal canula 3-4 L and therefore pulmonary consulted for further recommendations.  ? ?Pertinent  Medical History  ?Never smoker, left upper lung nodule (presumed malignant) s/p SBRT, HTN, HLD,  DVT/ PE on Eliquis, breast cancer, hypothyroidism, anemia, hiatal hernia, OA, osteopenia, GIB ? ?Chronic diarrhea since right hemicolectomy/small bowel resection for intussusception in 2021 secondary to colon lipoma ?Chronic partially calcified lesion anterior to the duodenum question in pancreas, negative on PET scanning x2 ? ?Significant Hospital Events: ?Including procedures, antibiotic start and stop dates in addition to other pertinent events    ?4/1 admit to [facility] ?4/4 Pulmonary consulted ? ?4/1 azithro > 5 day scheduled ?4/1 ceftriaxone > 4/2 ? ?Interim History / Subjective:  ?Overall, feeling better ? ?Objective   ?Blood pressure (!) 155/91, pulse 82, temperature 98.1 ?F (36.7 ?C), temperature source Oral, resp. rate 18, height 5\' 2"  (1.575 m), weight 57.2 kg, SpO2 91 %. ?   ?   ? ?Intake/Output Summary (Last 24 hours) at 11/10/2021 1223 ?Last data filed at 11/10/2021 6629 ?Gross per 24 hour  ?Intake 1140 ml  ?Output 400 ml  ?Net 740 ml  ? ?Filed Weights  ? 11/07/21 1556  ?Weight: 57.2 kg  ? ?Examination: ?General:  very pleasant thin elderly female lying in bed in NAD ?HEENT: MM pink/moist, HOH ?Neuro:  Awake, oriented x 3, MAE ?CV: rr ?PULM:  non labored, speaking full sentences, lungs clear throughout, diminished in left base, no wheezing or rhonchi, mild congested cough ?GI: soft, bs+, NT ?Extremities: warm/dry, 2+ LE edema  ?Skin: no rashes ? ?Remains afebrile and normal leukocytosis ? ?Resolved Hospital Problem list   ? ?Assessment & Plan:  ? ?Acute hypoxic respiratory failure secondary to metapneumovirus ?- likely to be sent home on home O2, possibly tomorrow, likely will take time for her to recover and should be able to come off O2 ?- no need for abx ?- agree with short steroid taper ?- adding protonix, needs to continue once at home given large hiatal hernia ?- albuterol prn  ?- aggressive pulm hygiene> IS, flutter, mucinex, mobilize as able ?- remains DNI ?- will have our office call and set her up for hospital f/u  ? ? ?LUL nodule and pleural effusion ?- presumed malignant, previous tissue biopsy deferred given age, anticoagulation, co-morbidities, and patient's preference for non aggressive therapy.  S/p SBRT 2022.  Most recent chest CT 08/2021 shows enlargement in mass.  No further treatment at this time.   ?- pleural effusion could be malignant, chronic since fall 2022, would not pursue any further intervention at this time.  ? ?Best Practice  (right click and "Reselect all SmartList Selections" daily)  ?Per primary team ? ?Labs   ?CBC: ?Recent Labs  ?Lab 11/07/21 ?0928 11/08/21 ?0350  ?WBC 7.4 9.3  ?HGB 15.7* 18.8*  ?HCT 46.5* 56.6*  ?MCV 91.9 91.9  ?PLT 268 146*  ? ? ?Basic Metabolic Panel: ?Recent Labs  ?Lab 11/07/21 ?4765 11/08/21 ?0350 11/09/21 ?0329  ?NA 136 134* 131*  ?K 2.9* 3.3* 4.0  ?CL 93* 93* 94*  ?CO2 34* 34* 30  ?GLUCOSE 114* 100* 153*  ?BUN 8 10 14   ?CREATININE 0.53 0.59 0.51  ?CALCIUM 9.3 8.9 8.7*  ?MG 1.8  --   --   ? ?GFR: ?Estimated Creatinine Clearance: 36.2 mL/min (by C-G formula based on SCr of 0.51 mg/dL). ?Recent Labs  ?Lab 11/07/21 ?0928 11/07/21 ?1509 11/08/21 ?0350  ?PROCALCITON  --  <0.10  --   ?WBC 7.4  --  9.3  ? ? ?Liver Function Tests: ?Recent Labs  ?Lab 11/07/21 ?0928 11/08/21 ?0350  ?AST 20 18  ?ALT 9 9  ?ALKPHOS 52 45  ?BILITOT 0.8 0.4  ?PROT  6.5 5.7*  ?ALBUMIN 3.4* 2.9*  ? ?Recent Labs  ?Lab 11/07/21 ?0928  ?LIPASE 32  ? ?No results for input(s): AMMONIA in the last 168 hours. ? ?ABG ?No results found for: PHART, PCO2ART, PO2ART, HCO3, TCO2, ACIDBASEDEF, O2SAT  ? ?Coagulation Profile: ?No results for input(s): INR, PROTIME in the last 168 hours. ? ?Cardiac Enzymes: ?No results for input(s): CKTOTAL, CKMB, CKMBINDEX, TROPONINI in the last 168 hours. ? ?HbA1C: ?No results found for: HGBA1C ? ?CBG: ?No results for input(s): GLUCAP in the last 168 hours. ? ?Review of Systems:   ?Review of Systems  ?Constitutional:  Positive for malaise/fatigue. Negative for chills, fever and weight loss.  ?HENT:  Positive for congestion.   ?Respiratory:  Positive for cough, sputum production and shortness of breath. Negative for hemoptysis and wheezing.   ?Cardiovascular:  Positive for leg swelling. Negative for chest pain and palpitations.  ?Gastrointestinal:  Positive for diarrhea. Negative for abdominal pain and vomiting.  ?Neurological:  Negative for focal weakness.  ? ?Past Medical History:  ?She,  has a past medical history of Anemia,  Breast CA ([place]), Diverticulitis of colon, GI bleed (04/2020), Hiatal hernia, History of radiation therapy (08/05/2020), Hyperlipidemia, Hypertension, Hypothyroidism, Intussusception of intestine ([place]) (202

## 2021-11-10 NOTE — Telephone Encounter (Signed)
Pt scheduled for an appointment with Dr. Carlean Purl for 12/03/2021 at 8:50: ?Will notify pt once pt is discharged from the Hospital: ? ?

## 2021-11-11 DIAGNOSIS — I10 Essential (primary) hypertension: Secondary | ICD-10-CM | POA: Diagnosis not present

## 2021-11-11 DIAGNOSIS — J9601 Acute respiratory failure with hypoxia: Secondary | ICD-10-CM | POA: Diagnosis not present

## 2021-11-11 DIAGNOSIS — K529 Noninfective gastroenteritis and colitis, unspecified: Secondary | ICD-10-CM | POA: Diagnosis not present

## 2021-11-11 DIAGNOSIS — E039 Hypothyroidism, unspecified: Secondary | ICD-10-CM | POA: Diagnosis not present

## 2021-11-11 LAB — CBC
HCT: 40 % (ref 36.0–46.0)
Hemoglobin: 13.1 g/dL (ref 12.0–15.0)
MCH: 30.8 pg (ref 26.0–34.0)
MCHC: 32.8 g/dL (ref 30.0–36.0)
MCV: 93.9 fL (ref 80.0–100.0)
Platelets: 302 10*3/uL (ref 150–400)
RBC: 4.26 MIL/uL (ref 3.87–5.11)
RDW: 13.9 % (ref 11.5–15.5)
WBC: 10.6 10*3/uL — ABNORMAL HIGH (ref 4.0–10.5)
nRBC: 0 % (ref 0.0–0.2)

## 2021-11-11 LAB — BASIC METABOLIC PANEL WITH GFR
Anion gap: 7 (ref 5–15)
BUN: 16 mg/dL (ref 8–23)
CO2: 32 mmol/L (ref 22–32)
Calcium: 8.9 mg/dL (ref 8.9–10.3)
Chloride: 93 mmol/L — ABNORMAL LOW (ref 98–111)
Creatinine, Ser: 0.53 mg/dL (ref 0.44–1.00)
GFR, Estimated: >60 mL/min
Glucose, Bld: 83 mg/dL (ref 70–99)
Potassium: 4.5 mmol/L (ref 3.5–5.1)
Sodium: 132 mmol/L — ABNORMAL LOW (ref 135–145)

## 2021-11-11 MED ORDER — FUROSEMIDE 10 MG/ML IJ SOLN
20.0000 mg | Freq: Two times a day (BID) | INTRAMUSCULAR | Status: DC
  Administered 2021-11-11 – 2021-11-13 (×4): 20 mg via INTRAVENOUS
  Filled 2021-11-11 (×4): qty 2

## 2021-11-11 MED ORDER — FUROSEMIDE 10 MG/ML IJ SOLN
20.0000 mg | Freq: Once | INTRAMUSCULAR | Status: AC
Start: 2021-11-11 — End: 2021-11-11
  Administered 2021-11-11: 20 mg via INTRAVENOUS
  Filled 2021-11-11: qty 2

## 2021-11-11 NOTE — Plan of Care (Signed)
  Problem: Education: Goal: Knowledge of General Education information will improve Description: Including pain rating scale, medication(s)/side effects and non-pharmacologic comfort measures Outcome: Progressing   Problem: Activity: Goal: Risk for activity intolerance will decrease Outcome: Progressing   Problem: Elimination: Goal: Will not experience complications related to bowel motility Outcome: Progressing   

## 2021-11-11 NOTE — Progress Notes (Addendum)
[facility] ? ?PROGRESS NOTE ? ?Ariel Medina XTG:[id number] DOB: 1927/10/31 DOA: 11/07/2021 ?PCP: Colon Branch, MD ? ?Brief hospital course ? ?88 year old female with history of hypertension, breast cancer, pulmonary nodule, PE/DVT on Eliquis, hypothyroidism, hypertension, chronic diarrhea presented with cough and shortness of breath for 1 week.  Denies any fever.  Tried over-the-counter medications with no help.  Came to ED, found to be in acute hypoxemic respiratory failure, requiring 4 L/min of oxygen.  ? ? ? ?Subjective  ? ?Patient seen and examined, denies any complaints.  Oxygen requirement has gone up to 11 L/min HFNC this morning.  Appreciate pulmonary recommendation. ? ?  ? ?Assessment and Plan: ? ?* Acute respiratory failure with hypoxia (HCC) ? ??  Underlying COPD versus acute bronchitis ?-Procalcitonin less than 0.10, ceftriaxone was discontinued ?-Continue Zithromax ?-DuoNeb nebulizer every 6 hours ?-IV Solu-Medrol 40 mg every 12 hours was changed to prednisone 40 mg daily from today ?-Continue Mucinex to 1200 mg p.o. twice daily  ?-BNP 80.7, respiratory panel showed metapneumovirus ?-Repeat chest x-ray shows atelectasis versus pneumonia at lung bases ?-Flutter valve every 4 hours ? ?-She was started on Pulmicort nebulizer twice daily ?-Appreciate pulmonary input, they recommend discharging home on oxygen and then follow-up pulmonology as outpatient ?--On 4/5 oxygen requirement went up to 11 L/min of oxygen via high flow nasal cannula ?-Review of chest x-ray from 11/08/2021 shows small pleural effusions at lung bases.  Started on Lasix 20 mg IV every 12 hours with excellent diuresis ?-She diuresed well over 5 L on IV Lasix ?-We will discontinue IV Lasix ?-Oxygen requirement has come down to 3 to 4 L/min HFNC ?-Likely discharge home on oxygen in 1 to 2 days ? ?Hyponatremia ?- Sodium was 132 due to HCTZ ?-Dose of HCTZ changed to 12.5 mg daily ?-Sodium today is 136 ? ?Lung mass ?- Patient has history of  primary malignant neoplasm of left upper lobe of lung ?-She was seen by radiation oncology, underwent radiation treatments ?-Not a candidate for chemotherapy ?-Did not undergo biopsy ?-Last CT scan from January 2023 showed interval increase in size of lung mass, no further intervention recommended by radiation oncology ?-Follow-up radiation oncology as outpatient ? ?Chronic diarrhea ?- Patient has chronic diarrhea after colectomy ?-GI consulted, started on Imodium with improvement ? ?VTE (venous thromboembolism) ?- Continue Eliquis ? ?Essential hypertension ?- Patient is currently on HCTZ 25 mg daily ?-HCTZ was cut down to to 12.5 mg daily due to hyponatremia and hypokalemia ?-Also started on  amlodipine 5 mg daily ? ?Hypothyroidism ?- Continue Synthroid ? ? ? ? ?  ?Medications ? ?  ? amLODipine  5 mg Oral Daily  ? apixaban  5 mg Oral BID  ? budesonide (PULMICORT) nebulizer solution  0.25 mg Nebulization BID  ? guaiFENesin  1,200 mg Oral BID  ? hydrochlorothiazide  12.5 mg Oral Daily  ? levothyroxine  75 mcg Oral Q0600  ? loperamide  4 mg Oral Daily  ? mouth rinse  15 mL Mouth Rinse BID  ? multivitamin  1 tablet Oral Daily  ? pantoprazole  40 mg Oral Daily  ? potassium chloride  20 mEq Oral BID  ? predniSONE  40 mg Oral Q breakfast  ? ? ? Data Reviewed:  ? ?CBG: ? ?No results for input(s): GLUCAP in the last 168 hours. ? ?SpO2: 92 % ?O2 Flow Rate (L/min): 3 L/min  ? ? ?Vitals:  ? 11/13/21 0623 11/13/21 0830 11/13/21 0952 11/13/21 1158  ?BP: (!) 147/98  125/79 129/79  ?Pulse: 76  88 83  ?Resp: 19   18  ?Temp: 98 ?F (36.7 ?C)  97.9 ?F (36.6 ?C) (!) 97.3 ?F (36.3 ?C)  ?TempSrc: Oral  Oral Oral  ?SpO2: 96% 93% 97% 92%  ?Weight:      ?Height:      ? ? ? ? ?Data Reviewed: ? ?Basic Metabolic Panel: ?Recent Labs  ?Lab 11/07/21 ?5732 11/08/21 ?0350 11/09/21 ?2025 11/11/21 ?4270 11/12/21 ?6237 11/13/21 ?6283  ?NA 136 134* 131* 132* 136 135  ?K 2.9* 3.3* 4.0 4.5 3.8 3.9  ?CL 93* 93* 94* 93* 90* 91*  ?CO2 34* 34* 30 32 39* 37*   ?GLUCOSE 114* 100* 153* 83 111* 94  ?BUN 8 10 14 16 18  26*  ?CREATININE 0.53 0.59 0.51 0.53 0.68 0.68  ?CALCIUM 9.3 8.9 8.7* 8.9 9.5 9.2  ?MG 1.8  --   --   --   --   --   ? ? ?CBC: ?Recent Labs  ?Lab 11/07/21 ?1517 11/08/21 ?0350 11/11/21 ?0324  ?WBC 7.4 9.3 10.6*  ?HGB 15.7* 18.8* 13.1  ?HCT 46.5* 56.6* 40.0  ?MCV 91.9 91.9 93.9  ?PLT 268 146* 302  ? ? ?LFT ?Recent Labs  ?Lab 11/07/21 ?0928 11/08/21 ?0350  ?AST 20 18  ?ALT 9 9  ?ALKPHOS 52 45  ?BILITOT 0.8 0.4  ?PROT 6.5 5.7*  ?ALBUMIN 3.4* 2.9*  ? ?  ?Micro :  ? ? ? ?Antibiotics: ?Anti-infectives (From admission, onward)  ? ? Start     Dose/Rate Route Frequency Ordered Stop  ? 11/07/21 1700  cefTRIAXone (ROCEPHIN) 2 g in sodium chloride 0.9 % 100 mL IVPB  Status:  Discontinued       ? 2 g ?200 mL/hr over 30 Minutes Intravenous Daily 11/07/21 1553 11/08/21 1141  ? 11/07/21 1645  azithromycin (ZITHROMAX) tablet 500 mg       ? 500 mg Oral Daily 11/07/21 1553 11/11/21 1024  ? ?  ? ? ? ? ? ?DVT prophylaxis: Apixaban ? ?Code Status: Full code ? ?Family Communication: No family at bedside ? ?CONSULTS: ? ? ? ?Objective  ? ? ?Physical Examination: ? ?General-appears in no acute distress ?Heart-S1-S2, regular, no murmur auscultated ?Lungs-decreased breath sounds at lung bases ?Abdomen-soft, nontender, no organomegaly ?Extremities-no edema in the lower extremities ?Neuro-alert, oriented x3, no focal deficit noted ? ?Status is: Inpatient: Acute hypoxemic respiratory failure ? ? ?  ? ?[name] ?  ?[facility] ?If 7PM-7AM, please contact night-coverage at [email or web address], ?Office  [phone] ? ? ?11/13/2021, 2:02 PM  LOS: 5 days  ? ? ? ? ? ? ? ? ? ? ?  ?

## 2021-11-11 NOTE — TOC Initial Note (Signed)
Transition of Care (TOC) - Initial/Assessment Note  ? ? ?Patient Details  ?Name: Ariel Medina ?MRN: [id number] ?Date of Birth: March 17, 1928 ? ?Transition of Care (TOC) CM/SW Contact:    ?Tawanna Cooler, RN ?Phone Number: ?11/11/2021, 12:01 PM ? ?Clinical Narrative:                 ? ?Spoke with patient at bedside.  Her granddaughter, [name], also there.  Patient states her grandson lives with her, but he travels a lot for work, so he's not always there.   ?Confirms she does not have home O2.  If home O2 needed, they do not have a preference for a DME company.   ?Patient really prefers to go home at discharge but agrees to SNF if that is recommended.  If SNF, choice is [facility], has been there twice in the past.   ? ?TOC CM following for discharge needs. ? ? ?Expected Discharge Plan: [place] ?Barriers to Discharge: Continued Medical Work up ? ? ?Expected Discharge Plan and Services ?Expected Discharge Plan: [place] ?  ?  ?Living arrangements for the past 2 months: [place] ?   ?Prior Living Arrangements/Services ?Living arrangements for the past 2 months: [place] ?Lives with:: Relatives, Self ?Patient language and need for interpreter reviewed:: Yes ?Do you feel safe going back to the place where you live?: Yes      ?Need for Family Participation in Patient Care: Yes (Comment) ?Care giver support system in place?: Yes (comment) ?  ?Criminal Activity/Legal Involvement Pertinent to Current Situation/Hospitalization: No - Comment as needed ? ?Activities of Daily Living ?Home Assistive Devices/Equipment: Cane (specify quad or straight), Shower chair with back, Walker (specify type) (four wheel walker/rolator) ?ADL Screening (condition at time of admission) ?Patient's cognitive ability adequate to safely complete daily activities?: Yes ?Is the patient deaf or have difficulty hearing?: Yes ?Does the patient have difficulty seeing, even when wearing  glasses/contacts?: No ?Does the patient have difficulty concentrating, remembering, or making decisions?: No ?Patient able to express need for assistance with ADLs?: Yes ?Does the patient have difficulty dressing or bathing?: Yes ?Independently performs ADLs?: No ?Communication: Independent ?Dressing (OT): Needs assistance ?Is this a change from baseline?: Change from baseline, expected to last >3 days ?Grooming: Needs assistance ?Is this a change from baseline?: Change from baseline, expected to last <3 days ?Feeding: Independent ?Bathing: Needs assistance ?Is this a change from baseline?: Change from baseline, expected to last >3 days ?Toileting: Needs assistance ?Is this a change from baseline?: Change from baseline, expected to last >3days ?In/Out Bed: Needs assistance ?Is this a change from baseline?: Change from baseline, expected to last >3 days ?Walks in Home: Independent with device (comment) (used rolator) ?Does the patient have difficulty walking or climbing stairs?: Yes ?Weakness of Legs: Both ?Weakness of Arms/Hands: Both ? ?Emotional Assessment ?Appearance:: Appears stated age ?Attitude/Demeanor/Rapport: Engaged ?Affect (typically observed): Appropriate ?Orientation: : Oriented to Self, Oriented to Place, Oriented to  Time, Oriented to Situation ?Alcohol / Substance Use: Not Applicable ?Psych Involvement: No (comment) ? ?Admission diagnosis:  Wheezing [R06.2] ?Hypokalemia [E87.6] ?Acute respiratory failure with hypoxia ([place]) [J96.01] ?Acute hypoxemic respiratory failure ([place]) [J96.01] ?Patient Active Problem List  ? Diagnosis Date Noted  ? Hyponatremia 11/10/2021  ? Lung mass 11/08/2021  ? Acute hypoxemic respiratory failure ([place]) 11/08/2021  ? Acute respiratory failure with hypoxia ([place]) 11/07/2021  ? Cough 11/07/2021  ? Chronic diarrhea 11/07/2021  ? Dehydration 11/07/2021  ?  Solitary pulmonary nodule 07/09/2020  ? Abnormal findings on diagnostic imaging of lung 05/30/2020  ? Acute deep vein  thrombosis (DVT) of proximal vein of both lower extremities ([place]) 05/30/2020  ? VTE (venous thromboembolism) 05/30/2020  ? Goals of care, counseling/discussion   ? Palliative care by specialist   ? DNR (do not resuscitate) discussion   ? Intussusception ([place]) 04/29/2020  ? Pulmonary embolism, bilateral ([place]) 04/29/2020  ? Abnormal CT scan of lung 04/29/2020  ? Hypoxia 04/29/2020  ? Acute pulmonary embolism ([place]) 04/29/2020  ? Pre-ulcerative calluses 01/25/2020  ? Essential hypertension 11/29/2016  ? Closed fracture of multiple pubic rami ([place]) 11/29/2016  ? Fall   ? Closed fracture of left pubis ([place]) 11/28/2016  ? Hypokalemia 11/28/2016  ? Hearing loss 09/19/2015  ? PCP NOTES >>> 05/13/2015  ? Allergic rhinitis 10/24/2014  ? Bronchitis 10/24/2014  ? Annual physical exam 04/14/2011  ? BACK PAIN, shoulder pain 12/11/2007  ? Osteoporosis 10/20/2007  ? NEOPLASM, MALIGNANT, BREAST, HX OF 10/20/2007  ? Hypothyroidism 01/30/2007  ? Hyperlipidemia 01/30/2007  ? HTN and mild edema 01/30/2007  ? OSTEOARTHRITIS 01/30/2007  ? ?PCP:  Colon Branch, MD ?Pharmacy:   ?[facility], [address] ?[address] ?[place] [address] ?Phone: [phone] Fax: [phone] ? ?[place] ?[address] ?[place] [address] ?Phone: [phone] Fax: [phone] ? ? ?

## 2021-11-11 NOTE — Progress Notes (Signed)
Pt assisted oob to chair on 4lnc, desat to 83%. Unable to get O2 sat >90% until on 11L HFNC. Dr Darrick Meigs at bedside and aware. IV lasix ordered and given. Pt tachypneic with RR 24-28.  ?

## 2021-11-11 NOTE — Plan of Care (Signed)
?  Problem: Education: ?Goal: Knowledge of General Education information will improve ?Description: Including pain rating scale, medication(s)/side effects and non-pharmacologic comfort measures ?Outcome: Progressing ?  ?Problem: Health Behavior/Discharge Planning: ?Goal: Ability to manage health-related needs will improve ?Outcome: Progressing ?  ?Problem: Clinical Measurements: ?Goal: Ability to maintain clinical measurements within normal limits will improve ?Outcome: Progressing ?Goal: Will remain free from infection ?Outcome: Progressing ?Note: PO zithromax ?Goal: Diagnostic test results will improve ?Outcome: Progressing ?Goal: Respiratory complications will improve ?Outcome: Progressing ?Note: Increased O2 requirements this AM, weaning down after IV lasix ?Goal: Cardiovascular complication will be avoided ?Outcome: Progressing ?  ?Problem: Activity: ?Goal: Risk for activity intolerance will decrease ?Outcome: Progressing ?  ?Problem: Elimination: ?Goal: Will not experience complications related to bowel motility ?Outcome: Progressing ?  ?Problem: Safety: ?Goal: Ability to remain free from injury will improve ?Outcome: Progressing ?  ?Problem: Skin Integrity: ?Goal: Risk for impaired skin integrity will decrease ?Outcome: Progressing ?  ?Problem: Education: ?Goal: Knowledge of disease or condition will improve ?Outcome: Progressing ?Goal: Knowledge of the prescribed therapeutic regimen will improve ?Outcome: Progressing ?  ?Problem: Activity: ?Goal: Ability to tolerate increased activity will improve ?Outcome: Progressing ?Goal: Will verbalize the importance of balancing activity with adequate rest periods ?Outcome: Progressing ?  ?Problem: Respiratory: ?Goal: Ability to maintain a clear airway will improve ?Outcome: Progressing ?Goal: Levels of oxygenation will improve ?Outcome: Progressing ?Goal: Ability to maintain adequate ventilation will improve ?Outcome: Progressing ?  ?Problem: Activity: ?Goal: Ability  to tolerate increased activity will improve ?Outcome: Progressing ?  ?Problem: Respiratory: ?Goal: Ability to maintain adequate ventilation will improve ?Outcome: Progressing ?Goal: Ability to maintain a clear airway will improve ?Outcome: Progressing ?  ?

## 2021-11-12 DIAGNOSIS — J9601 Acute respiratory failure with hypoxia: Secondary | ICD-10-CM | POA: Diagnosis not present

## 2021-11-12 DIAGNOSIS — I10 Essential (primary) hypertension: Secondary | ICD-10-CM | POA: Diagnosis not present

## 2021-11-12 DIAGNOSIS — K529 Noninfective gastroenteritis and colitis, unspecified: Secondary | ICD-10-CM | POA: Diagnosis not present

## 2021-11-12 DIAGNOSIS — E876 Hypokalemia: Secondary | ICD-10-CM | POA: Diagnosis not present

## 2021-11-12 LAB — BASIC METABOLIC PANEL
Anion gap: 7 (ref 5–15)
BUN: 18 mg/dL (ref 8–23)
CO2: 39 mmol/L — ABNORMAL HIGH (ref 22–32)
Calcium: 9.5 mg/dL (ref 8.9–10.3)
Chloride: 90 mmol/L — ABNORMAL LOW (ref 98–111)
Creatinine, Ser: 0.68 mg/dL (ref 0.44–1.00)
GFR, Estimated: >60 mL/min (ref >60)
Glucose, Bld: 111 mg/dL — ABNORMAL HIGH (ref 70–99)
Potassium: 3.8 mmol/L (ref 3.5–5.1)
Sodium: 136 mmol/L (ref 135–145)

## 2021-11-12 LAB — BLOOD GAS, ARTERIAL
Acid-Base Excess: 15.8 mmol/L — ABNORMAL HIGH (ref 0.0–2.0)
Bicarbonate: 41.9 mmol/L — ABNORMAL HIGH (ref 20.0–28.0)
Drawn by: 25770
FIO2: 36 %
O2 Saturation: 95.7 %
Patient temperature: 36.4
pCO2 arterial: 54 mmHg — ABNORMAL HIGH (ref 32–48)
pH, Arterial: 7.5 — ABNORMAL HIGH (ref 7.35–7.45)
pO2, Arterial: 63 mmHg — ABNORMAL LOW (ref 83–108)

## 2021-11-12 MED ORDER — ACETAZOLAMIDE 250 MG PO TABS
250.0000 mg | ORAL_TABLET | Freq: Two times a day (BID) | ORAL | Status: DC
  Filled 2021-11-12: qty 1

## 2021-11-12 NOTE — Progress Notes (Signed)
PT using flutter- reports productive cough (getting better per PT). ?

## 2021-11-12 NOTE — Progress Notes (Signed)
[facility] ? ?PROGRESS NOTE ? ?Ariel Medina EXN:[id number] DOB: 09/23/1927 DOA: 11/07/2021 ?PCP: Colon Branch, MD ? ?Brief hospital course ? ?88 year old female with history of hypertension, breast cancer, pulmonary nodule, PE/DVT on Eliquis, hypothyroidism, hypertension, chronic diarrhea presented with cough and shortness of breath for 1 week.  Denies any fever.  Tried over-the-counter medications with no help.  Came to ED, found to be in acute hypoxemic respiratory failure, requiring 4 L/min of oxygen.  ? ? ? ?Subjective  ? ?Patient seen and examined, she was requiring 11 L/min HFNC yesterday.  Started on Lasix 20 mg IV every 12 hours with excellent diuresis.  She diuresed over 3 L yesterday. ? ?  ? ?Assessment and Plan: ? ?* Acute respiratory failure with hypoxia (HCC) ? ??  Underlying COPD versus acute bronchitis ?-Procalcitonin less than 0.10, ceftriaxone was discontinued ?-Continue Zithromax ?-DuoNeb nebulizer every 6 hours ?-IV Solu-Medrol 40 mg every 12 hours was changed to prednisone 40 mg daily from today ?-Continue Mucinex to 1200 mg p.o. twice daily  ?-BNP 80.7 ?-Repeat chest x-ray shows atelectasis versus pneumonia at lung bases ?-Flutter valve every 4 hours ?-Oxygen requirement went up to 11 L/min of oxygen via high flow nasal cannula ?-She was started on Pulmicort nebulizer twice daily ?-Appreciate pulmonary input, they recommend discharging home on oxygen and then follow-up pulmonology as outpatient ?-Review of chest x-ray from 11/08/2021 shows small pleural effusions at lung bases.  Started on Lasix 20 mg IV every 12 hours with excellent diuresis ?-She diuresed over 3 L yesterday ?-Oxygen requirement has come down to 3 to 4 L/min HFNC ? ?Hyponatremia ?- Sodium was 132 due to HCTZ ?-Dose of HCTZ changed to 12.5 mg daily ?-Sodium today is 136 ? ?Lung mass ?- Patient has history of primary malignant neoplasm of left upper lobe of lung ?-She was seen by radiation oncology, underwent radiation  treatments ?-Not a candidate for chemotherapy ?-Did not undergo biopsy ?-Last CT scan from January 2023 showed interval increase in size of lung mass, no further intervention recommended by radiation oncology ?-Follow-up radiation oncology as outpatient ? ?Chronic diarrhea ?- Patient has chronic diarrhea after colectomy ?-GI consulted, started on Imodium with improvement ? ?VTE (venous thromboembolism) ?- Continue Eliquis ? ?Essential hypertension ?- Patient is currently on HCTZ 25 mg daily ?-HCTZ was cut down to to 12.5 mg daily due to hyponatremia and hypokalemia ?-Also started on  amlodipine 5 mg daily ? ?Hypothyroidism ?- Continue Synthroid ? ? ? ? ?  ?Medications ? ?  ? amLODipine  5 mg Oral Daily  ? apixaban  5 mg Oral BID  ? budesonide (PULMICORT) nebulizer solution  0.25 mg Nebulization BID  ? furosemide  20 mg Intravenous Q12H  ? guaiFENesin  1,200 mg Oral BID  ? hydrochlorothiazide  12.5 mg Oral Daily  ? levothyroxine  75 mcg Oral Q0600  ? loperamide  4 mg Oral Daily  ? mouth rinse  15 mL Mouth Rinse BID  ? multivitamin  1 tablet Oral Daily  ? pantoprazole  40 mg Oral Daily  ? potassium chloride  20 mEq Oral BID  ? predniSONE  40 mg Oral Q breakfast  ? ? ? Data Reviewed:  ? ?CBG: ? ?No results for input(s): GLUCAP in the last 168 hours. ? ?SpO2: 92 % ?O2 Flow Rate (L/min): 4 L/min  ? ? ?Vitals:  ? 11/12/21 0527 11/12/21 0717 11/12/21 0909 11/12/21 0917  ?BP: (!) 145/79  139/81   ?Pulse: 79  92   ?  Resp: 18  (!) 25   ?Temp: 98.1 ?F (36.7 ?C)  97.6 ?F (36.4 ?C)   ?TempSrc: Oral  Oral   ?SpO2: 93% 91% 92% 92%  ?Weight:      ?Height:      ? ? ? ? ?Data Reviewed: ? ?Basic Metabolic Panel: ?Recent Labs  ?Lab 11/07/21 ?9311 11/08/21 ?0350 11/09/21 ?2162 11/11/21 ?4469 11/12/21 ?5072  ?NA 136 134* 131* 132* 136  ?K 2.9* 3.3* 4.0 4.5 3.8  ?CL 93* 93* 94* 93* 90*  ?CO2 34* 34* 30 32 39*  ?GLUCOSE 114* 100* 153* 83 111*  ?BUN 8 10 14 16 18   ?CREATININE 0.53 0.59 0.51 0.53 0.68  ?CALCIUM 9.3 8.9 8.7* 8.9 9.5  ?MG 1.8   --   --   --   --   ? ? ?CBC: ?Recent Labs  ?Lab 11/07/21 ?2575 11/08/21 ?0350 11/11/21 ?0324  ?WBC 7.4 9.3 10.6*  ?HGB 15.7* 18.8* 13.1  ?HCT 46.5* 56.6* 40.0  ?MCV 91.9 91.9 93.9  ?PLT 268 146* 302  ? ? ?LFT ?Recent Labs  ?Lab 11/07/21 ?0928 11/08/21 ?0350  ?AST 20 18  ?ALT 9 9  ?ALKPHOS 52 45  ?BILITOT 0.8 0.4  ?PROT 6.5 5.7*  ?ALBUMIN 3.4* 2.9*  ? ?  ?Micro :  ? ? ? ?Antibiotics: ?Anti-infectives (From admission, onward)  ? ? Start     Dose/Rate Route Frequency Ordered Stop  ? 11/07/21 1700  cefTRIAXone (ROCEPHIN) 2 g in sodium chloride 0.9 % 100 mL IVPB  Status:  Discontinued       ? 2 g ?200 mL/hr over 30 Minutes Intravenous Daily 11/07/21 1553 11/08/21 1141  ? 11/07/21 1645  azithromycin (ZITHROMAX) tablet 500 mg       ? 500 mg Oral Daily 11/07/21 1553 11/11/21 1024  ? ?  ? ? ? ? ? ?DVT prophylaxis: Apixaban ? ?Code Status: Full code ? ?Family Communication: No family at bedside ? ?CONSULTS: ? ? ? ?Objective  ? ? ?Physical Examination: ? ?General-appears in no acute distress ?Heart-S1-S2, regular, no murmur auscultated ?Lungs-clear to auscultation bilaterally, no wheezing or crackles auscultated ?Abdomen-soft, nontender, no organomegaly ?Extremities-no edema in the lower extremities ?Neuro-alert, oriented x3, no focal deficit noted ? ?Status is: Inpatient: Acute hypoxemic respiratory failure ? ? ?  ? ?[name] ?  ?[facility] ?If 7PM-7AM, please contact night-coverage at [email or web address], ?Office  [phone] ? ? ?11/12/2021, 11:29 AM  LOS: 4 days  ? ? ? ? ? ? ? ? ? ? ?  ?

## 2021-11-12 NOTE — Progress Notes (Signed)
Notified Lab that ABG being sent for analysis. 

## 2021-11-13 ENCOUNTER — Inpatient Hospital Stay (HOSPITAL_COMMUNITY): Payer: Medicare HMO

## 2021-11-13 DIAGNOSIS — K529 Noninfective gastroenteritis and colitis, unspecified: Secondary | ICD-10-CM | POA: Diagnosis not present

## 2021-11-13 DIAGNOSIS — I10 Essential (primary) hypertension: Secondary | ICD-10-CM | POA: Diagnosis not present

## 2021-11-13 DIAGNOSIS — E871 Hypo-osmolality and hyponatremia: Secondary | ICD-10-CM

## 2021-11-13 DIAGNOSIS — J9601 Acute respiratory failure with hypoxia: Secondary | ICD-10-CM | POA: Diagnosis not present

## 2021-11-13 DIAGNOSIS — R918 Other nonspecific abnormal finding of lung field: Secondary | ICD-10-CM

## 2021-11-13 LAB — BASIC METABOLIC PANEL
Anion gap: 7 (ref 5–15)
BUN: 26 mg/dL — ABNORMAL HIGH (ref 8–23)
CO2: 37 mmol/L — ABNORMAL HIGH (ref 22–32)
Calcium: 9.2 mg/dL (ref 8.9–10.3)
Chloride: 91 mmol/L — ABNORMAL LOW (ref 98–111)
Creatinine, Ser: 0.68 mg/dL (ref 0.44–1.00)
GFR, Estimated: >60 mL/min (ref >60)
Glucose, Bld: 94 mg/dL (ref 70–99)
Potassium: 3.9 mmol/L (ref 3.5–5.1)
Sodium: 135 mmol/L (ref 135–145)

## 2021-11-13 NOTE — Evaluation (Signed)
Physical Therapy Evaluation ?Patient Details ?Name: Ariel Medina ?MRN: [id number] ?DOB: 01/09/1928 ?Today's Date: 11/13/2021 ? ?History of Present Illness ? Pt is 88 yo female admitted on 11/07/21 with hypoxemic resp failure.  Pt with hx of HTN, breast CA, pulmonary nodule, PE/DVT, hypothyroidism, HTN, and chronic diarrhea.  ?Clinical Impression ? Pt admitted with above diagnosis. At baseline , pt is able to ambulate with rollator, has some assist with bathing but otherwise independent.  Her grandson lives with her but is at work frequently, reports sister is next door, very helpful, and could be available more initially.  After 6 days in hospital, pt present with decreased mobility, strength, endurance, and balance. She is very motivated to move and return home - expected to progress well. She did require 3 L O2 and sats still down to 86% with mobility.   Pt currently with functional limitations due to the deficits listed below (see PT Problem List). Pt will benefit from skilled PT to increase their independence and safety with mobility to allow discharge to the venue listed below.   ?   ?   ? ?Recommendations for follow up therapy are one component of a multi-disciplinary discharge planning process, led by the attending physician.  Recommendations may be updated based on patient status, additional functional criteria and insurance authorization. ? ?Follow Up Recommendations Home health PT ? ?  ?Assistance Recommended at Discharge Frequent or constant Supervision/Assistance (initially)  ?Patient can return home with the following ? A little help with walking and/or transfers;A little help with bathing/dressing/bathroom;Help with stairs or ramp for entrance ? ?  ?Equipment Recommendations None recommended by PT  ?Recommendations for Other Services ?    ?  ?Functional Status Assessment Patient has had a recent decline in their functional status and demonstrates the ability to make significant improvements in function in  a reasonable and predictable amount of time.  ? ?  ?Precautions / Restrictions Precautions ?Precautions: Fall  ? ?  ? ?Mobility ? Bed Mobility ?  ?  ?  ?  ?  ?  ?  ?General bed mobility comments: in chair at arrival ?  ? ?Transfers ?Overall transfer level: Needs assistance ?Equipment used: Rolling walker (2 wheels) ?Transfers: Sit to/from Stand ?Sit to Stand: Min guard ?  ?  ?  ?  ?  ?General transfer comment: cues for hand placement; performed x 2 ?  ? ?Ambulation/Gait ?Ambulation/Gait assistance: Min guard ?Gait Distance (Feet): 30 Feet ?Assistive device: Rolling walker (2 wheels) ?Gait Pattern/deviations: Step-to pattern, Decreased stride length, Trunk flexed ?Gait velocity: decreased ?  ?  ?General Gait Details: Cues for RW proxmity ? ?Stairs ?  ?  ?  ?  ?  ? ?Wheelchair Mobility ?  ? ?Modified Rankin (Stroke Patients Only) ?  ? ?  ? ?Balance Overall balance assessment: Needs assistance ?Sitting-balance support: No upper extremity supported ?Sitting balance-Leahy Scale: Good ?  ?  ?Standing balance support: Bilateral upper extremity supported, Reliant on assistive device for balance ?Standing balance-Leahy Scale: Poor ?Standing balance comment: Min guard with RW ?  ?  ?  ?  ?  ?  ?  ?  ?  ?  ?  ?   ? ? ? ?Pertinent Vitals/Pain Pain Assessment ?Pain Assessment: No/denies pain  ? ? ?Home Living Family/patient expects to be discharged to:: Private residence ?Living Arrangements: Other (Comment) (Grandson lives with her but is gone a lot for work; sister lives next door) ?Available Help at Discharge: Family;Available PRN/intermittently ?Type of Home:  House ?Home Access: Stairs to enter ?  ?Entrance Stairs-Number of Steps: 1 ?  ?Home Layout: One level ?Home Equipment: Shower seat;Grab bars - tub/shower;Cane - single Barista (2 wheels);Rollator (4 wheels);BSC/3in1 ?   ?  ?Prior Function Prior Level of Function : Needs assist;Driving ?  ?  ?  ?  ?  ?  ?Mobility Comments: Uses rollator to walk; could  ambulate in community ?ADLs Comments: Pt independent with dressing and toileting; sister and granddaughter help with baths; can fix light meals ?  ? ? ?Hand Dominance  ?   ? ?  ?Extremity/Trunk Assessment  ? Upper Extremity Assessment ?Upper Extremity Assessment:  (ROM WFL; MMT 4/5) ?  ? ?Lower Extremity Assessment ?Lower Extremity Assessment:  (ROM WFL; MMT at least 3/5 but pt reports painful to touch so did not MMT) ?  ? ?Cervical / Trunk Assessment ?Cervical / Trunk Assessment: Kyphotic  ?Communication  ? Communication: HOH  ?Cognition Arousal/Alertness: Awake/alert ?Behavior During Therapy: [facility] for tasks assessed/performed ?Overall Cognitive Status: Within Functional Limits for tasks assessed ?  ?  ?  ?  ?  ?  ?  ?  ?  ?  ?  ?  ?  ?  ?  ?  ?  ?  ?  ? ?  ?General Comments General comments (skin integrity, edema, etc.): Pt on 3 L O2 with sats 91% rest, 86% ambulation but recovered in 30 seconds ? ?  ?Exercises    ? ?Assessment/Plan  ?  ?PT Assessment Patient needs continued PT services  ?PT Problem List Decreased strength;Decreased mobility;Decreased range of motion;Decreased activity tolerance;Cardiopulmonary status limiting activity;Decreased balance;Decreased knowledge of use of DME ? ?   ?  ?PT Treatment Interventions DME instruction;Therapeutic activities;Gait training;Therapeutic exercise;Patient/family education;Balance training;Functional mobility training   ? ?PT Goals (Current goals can be found in the Care Plan section)  ?Acute Rehab PT Goals ?Patient Stated Goal: return home ?PT Goal Formulation: With patient ?Time For Goal Achievement: 11/27/21 ?Potential to Achieve Goals: Good ? ?  ?Frequency Min 3X/week ?  ? ? ?Co-evaluation   ?  ?  ?  ?  ? ? ?  ?AM-PAC PT "6 Clicks" Mobility  ?Outcome Measure Help needed turning from your back to your side while in a flat bed without using bedrails?: A Little ?Help needed moving from lying on your back to sitting on the side of a flat bed without using bedrails?: A  Little ?Help needed moving to and from a bed to a chair (including a wheelchair)?: A Little ?Help needed standing up from a chair using your arms (e.g., wheelchair or bedside chair)?: A Little ?Help needed to walk in hospital room?: A Little ?Help needed climbing 3-5 steps with a railing? : A Lot ?6 Click Score: 17 ? ?  ?End of Session Equipment Utilized During Treatment: Gait belt;Oxygen ?Activity Tolerance: Patient tolerated treatment well ?Patient left: with call bell/phone within reach;with chair alarm set;in chair ?Nurse Communication: Mobility status ?PT Visit Diagnosis: Other abnormalities of gait and mobility (R26.89);Muscle weakness (generalized) (M62.81) ?  ? ?Time: 0160-1093 ?PT Time Calculation (min) (ACUTE ONLY): 24 min ? ? ?Charges:   PT Evaluation ?$PT Eval Low Complexity: 1 Low ?PT Treatments ?$Gait Training: 8-22 mins ?  ?   ? ? ?Abran Richard, PT ?Acute Rehab Services ?Pager [phone] ?[facility] [phone] ? ? ?Mikael Spray Debe Anfinson ?11/13/2021, 1:42 PM ? ?

## 2021-11-13 NOTE — Plan of Care (Signed)
Ariel Medina ambulated to BR this AM on 4lnc with O2 desat to 85%. She recovered to 93% on 4lnc upon sitting down.  She has been up in her chair all day. Appetite is robust. O2 at rest has been weaned to 3lnc with O2 sats 92-95%.  ?Problem: Education: ?Goal: Knowledge of General Education information will improve ?Description: Including pain rating scale, medication(s)/side effects and non-pharmacologic comfort measures ?Outcome: Progressing ?  ?Problem: Health Behavior/Discharge Planning: ?Goal: Ability to manage health-related needs will improve ?Outcome: Progressing ?  ?Problem: Clinical Measurements: ?Goal: Ability to maintain clinical measurements within normal limits will improve ?Outcome: Progressing ?Goal: Will remain free from infection ?Outcome: Progressing ?Goal: Diagnostic test results will improve ?Outcome: Progressing ?Goal: Respiratory complications will improve ?Outcome: Progressing ?Goal: Cardiovascular complication will be avoided ?Outcome: Progressing ?  ?Problem: Activity: ?Goal: Risk for activity intolerance will decrease ?Outcome: Progressing ?  ?Problem: Elimination: ?Goal: Will not experience complications related to bowel motility ?Outcome: Progressing ?  ?Problem: Safety: ?Goal: Ability to remain free from injury will improve ?Outcome: Progressing ?  ?Problem: Skin Integrity: ?Goal: Risk for impaired skin integrity will decrease ?Outcome: Progressing ?  ?Problem: Education: ?Goal: Knowledge of disease or condition will improve ?Outcome: Progressing ?Goal: Knowledge of the prescribed therapeutic regimen will improve ?Outcome: Progressing ?Goal: Individualized Educational Video(s) ?Outcome: Progressing ?  ?Problem: Activity: ?Goal: Ability to tolerate increased activity will improve ?Outcome: Progressing ?Goal: Will verbalize the importance of balancing activity with adequate rest periods ?Outcome: Progressing ?  ?Problem: Respiratory: ?Goal: Ability to maintain a clear airway will  improve ?Outcome: Progressing ?Goal: Levels of oxygenation will improve ?Outcome: Progressing ?Goal: Ability to maintain adequate ventilation will improve ?Outcome: Progressing ?  ?Problem: Activity: ?Goal: Ability to tolerate increased activity will improve ?Outcome: Progressing ?  ?Problem: Respiratory: ?Goal: Ability to maintain adequate ventilation will improve ?Outcome: Progressing ?Goal: Ability to maintain a clear airway will improve ?Outcome: Progressing ?  ?

## 2021-11-13 NOTE — Care Management Important Message (Signed)
Important Message ? ?Patient Details IM Letter placed in Patients room. ?Name: Ariel Medina ?MRN: [id number] ?Date of Birth: 1928-03-15 ? ? ?Medicare Important Message Given:  Yes ? ? ? ? ?Kerin Salen ?11/13/2021, 2:29 PM ?

## 2021-11-13 NOTE — Progress Notes (Addendum)
[facility] ? ?PROGRESS NOTE ? ?Ariel Medina:[id number] DOB: [birth year] DOA: 11/07/2021 ?PCP: Colon Branch, MD ? ?Brief hospital course ? ?88 year old female with history of hypertension, breast cancer, pulmonary nodule, PE/DVT on Eliquis, hypothyroidism, hypertension, chronic diarrhea presented with cough and shortness of breath for 1 week.  Denies any fever.  Tried over-the-counter medications with no help.  Came to ED, found to be in acute hypoxemic respiratory failure, requiring 4 L/min of oxygen.  ? ? ? ?Subjective  ? ?Patient seen and examined, denies shortness of breath.  Diuresed well with IV Lasix.  Now requiring 3 to 4 L/min of oxygen via nasal cannula. ? ?  ? ?Assessment and Plan: ? ?* Acute respiratory failure with hypoxia (HCC) ? ??  Underlying COPD versus acute bronchitis ?-Procalcitonin less than 0.10, ceftriaxone was discontinued ?-Continue Zithromax ?-DuoNeb nebulizer every 6 hours ?-IV Solu-Medrol 40 mg every 12 hours was changed to prednisone 40 mg daily from today ?-Continue Mucinex to 1200 mg p.o. twice daily  ?-BNP 80.7, respiratory panel showed metapneumovirus ?-Repeat chest x-ray shows atelectasis versus pneumonia at lung bases ?-Flutter valve every 4 hours ?-Oxygen requirement went up to 11 L/min of oxygen via high flow nasal cannula ?-She was started on Pulmicort nebulizer twice daily ?-Appreciate pulmonary input, they recommend discharging home on oxygen and then follow-up pulmonology as outpatient ?-Review of chest x-ray from 11/08/2021 shows small pleural effusions at lung bases.  Started on Lasix 20 mg IV every 12 hours with excellent diuresis ?-She diuresed well over 5 L on IV Lasix ?-We will discontinue IV Lasix ?-Oxygen requirement has come down to 3 to 4 L/min HFNC ?-Likely discharge home on oxygen in 1 to 2 days ? ?Hyponatremia ?- Sodium was 132 due to HCTZ ?-Dose of HCTZ changed to 12.5 mg daily ?-Sodium today is 136 ? ?Lung mass ?- Patient has history of primary malignant  neoplasm of left upper lobe of lung ?-She was seen by radiation oncology, underwent radiation treatments ?-Not a candidate for chemotherapy ?-Did not undergo biopsy ?-Last CT scan from January 2023 showed interval increase in size of lung mass, no further intervention recommended by radiation oncology ?-Follow-up radiation oncology as outpatient ? ?Chronic diarrhea ?- Patient has chronic diarrhea after colectomy ?-GI consulted, started on Imodium with improvement ? ?VTE (venous thromboembolism) ?- Continue Eliquis ? ?Essential hypertension ?- Patient is currently on HCTZ 25 mg daily ?-HCTZ was cut down to to 12.5 mg daily due to hyponatremia and hypokalemia ?-Also started on  amlodipine 5 mg daily ? ?Hypothyroidism ?- Continue Synthroid ? ? ? ? ?  ?Medications ? ?  ? amLODipine  5 mg Oral Daily  ? apixaban  5 mg Oral BID  ? budesonide (PULMICORT) nebulizer solution  0.25 mg Nebulization BID  ? guaiFENesin  1,200 mg Oral BID  ? hydrochlorothiazide  12.5 mg Oral Daily  ? levothyroxine  75 mcg Oral Q0600  ? loperamide  4 mg Oral Daily  ? mouth rinse  15 mL Mouth Rinse BID  ? multivitamin  1 tablet Oral Daily  ? pantoprazole  40 mg Oral Daily  ? potassium chloride  20 mEq Oral BID  ? predniSONE  40 mg Oral Q breakfast  ? ? ? Data Reviewed:  ? ?CBG: ? ?No results for input(s): GLUCAP in the last 168 hours. ? ?SpO2: 92 % ?O2 Flow Rate (L/min): 3 L/min  ? ? ?Vitals:  ? 11/13/21 0623 11/13/21 0830 11/13/21 0952 11/13/21 1158  ?BP: (!) 147/98  125/79 129/79  ?Pulse: 76  88 83  ?Resp: 19   18  ?Temp: 98 ?F (36.7 ?C)  97.9 ?F (36.6 ?C) (!) 97.3 ?F (36.3 ?C)  ?TempSrc: Oral  Oral Oral  ?SpO2: 96% 93% 97% 92%  ?Weight:      ?Height:      ? ? ? ? ?Data Reviewed: ? ?Basic Metabolic Panel: ?Recent Labs  ?Lab 11/07/21 ?9741 11/08/21 ?0350 11/09/21 ?6384 11/11/21 ?5364 11/12/21 ?6803 11/13/21 ?2122  ?NA 136 134* 131* 132* 136 135  ?K 2.9* 3.3* 4.0 4.5 3.8 3.9  ?CL 93* 93* 94* 93* 90* 91*  ?CO2 34* 34* 30 32 39* 37*  ?GLUCOSE 114*  100* 153* 83 111* 94  ?BUN 8 10 14 16 18  26*  ?CREATININE 0.53 0.59 0.51 0.53 0.68 0.68  ?CALCIUM 9.3 8.9 8.7* 8.9 9.5 9.2  ?MG 1.8  --   --   --   --   --   ? ? ?CBC: ?Recent Labs  ?Lab 11/07/21 ?4825 11/08/21 ?0350 11/11/21 ?0324  ?WBC 7.4 9.3 10.6*  ?HGB 15.7* 18.8* 13.1  ?HCT 46.5* 56.6* 40.0  ?MCV 91.9 91.9 93.9  ?PLT 268 146* 302  ? ? ?LFT ?Recent Labs  ?Lab 11/07/21 ?0928 11/08/21 ?0350  ?AST 20 18  ?ALT 9 9  ?ALKPHOS 52 45  ?BILITOT 0.8 0.4  ?PROT 6.5 5.7*  ?ALBUMIN 3.4* 2.9*  ? ?  ?Micro :  ? ? ? ?Antibiotics: ?Anti-infectives (From admission, onward)  ? ? Start     Dose/Rate Route Frequency Ordered Stop  ? 11/07/21 1700  cefTRIAXone (ROCEPHIN) 2 g in sodium chloride 0.9 % 100 mL IVPB  Status:  Discontinued       ? 2 g ?200 mL/hr over 30 Minutes Intravenous Daily 11/07/21 1553 11/08/21 1141  ? 11/07/21 1645  azithromycin (ZITHROMAX) tablet 500 mg       ? 500 mg Oral Daily 11/07/21 1553 11/11/21 1024  ? ?  ? ? ? ? ? ?DVT prophylaxis: Apixaban ? ?Code Status: Full code ? ?Family Communication: No family at bedside ? ?CONSULTS: ? ? ? ?Objective  ? ? ?Physical Examination: ? ?General-appears in no acute distress ?Heart-S1-S2, regular, no murmur auscultated ?Lungs-clear to auscultation bilaterally, no wheezing or crackles auscultated ?Abdomen-soft, nontender, no organomegaly ?Extremities-no edema in the lower extremities ?Neuro-alert, oriented x3, no focal deficit noted ? ?Status is: Inpatient: Acute hypoxemic respiratory failure ? ? ?  ? ?[name] ?  ?[facility] ?If 7PM-7AM, please contact night-coverage at [email or web address], ?Office  [phone] ? ? ?11/13/2021, 2:01 PM  LOS: 5 days  ? ? ? ? ? ? ? ? ? ? ?  ?

## 2021-11-14 DIAGNOSIS — J9601 Acute respiratory failure with hypoxia: Secondary | ICD-10-CM | POA: Diagnosis not present

## 2021-11-14 NOTE — Progress Notes (Signed)
?   11/14/21 1002 11/14/21 1113  ?Assess: MEWS Score  ?Temp  --  97.7 ?F (36.5 ?C)  ?BP  --  133/84  ?Pulse Rate  --  78  ?ECG Heart Rate  --  75  ?Resp  --  (!) 22  ?Level of Consciousness  --  Alert  ?SpO2  --  95 %  ?O2 Device  --  Nasal Cannula  ?O2 Flow Rate (L/min)  --  3 L/min  ?Assess: MEWS Score  ?MEWS Temp  --  0  ?MEWS Systolic  --  0  ?MEWS Pulse  --  0  ?MEWS RR  --  1  ?MEWS LOC  --  0  ?MEWS Score  --  1  ?MEWS Score Color  --  Green  ?Assess: if the MEWS score is Yellow or Red  ?Were vital signs taken at a resting state?  --  Yes  ?Focused Assessment  --  No change from prior assessment  ?Does the patient meet 2 or more of the SIRS criteria?  --  No  ?Does the patient have a confirmed or suspected source of infection?  --  Yes  ?Provider and Rapid Response Notified?  --  Yes  ?MEWS guidelines implemented *See Row Information*  --  Yes  ?Treat  ?Pain Scale  --  0-10  ?Notify: Provider  ?Provider Name/Title  --  Lynett Grimes MD  ?Date Provider Notified  --  11/14/21  ?Assess: SIRS CRITERIA  ?SIRS Temperature   --  0  ?SIRS Pulse  --  0  ?SIRS Respirations   --  1  ?SIRS WBC  --  0  ?SIRS Score Sum   --  1  ? ? ?

## 2021-11-14 NOTE — Progress Notes (Signed)
Physical Therapy Treatment ?Patient Details ?Name: Ariel Medina ?MRN: [id number] ?DOB: 17-Aug-1927 ?Today's Date: 11/14/2021 ? ? ?History of Present Illness Pt is 88 yo female admitted on 11/07/21 with hypoxemic resp failure.  Pt with hx of HTN, breast CA, pulmonary nodule, PE/DVT, hypothyroidism, HTN, and chronic diarrhea. ? ?  ?PT Comments  ? ? Progressing with mobility. Overall, Min guard assist on today. O2 87% on 2L [place] with ambulation. O2 90%  at rest in recliner end of session. Discussed d/c plan-pt reports she is mostly alone since her grandson is gone a lot. May need to consider ST SNF for rehab, especially if pt will have to go home on O2.  ?  ?Recommendations for follow up therapy are one component of a multi-disciplinary discharge planning process, led by the attending physician.  Recommendations may be updated based on patient status, additional functional criteria and insurance authorization. ? ?Follow Up Recommendations ? Home health PT (will need to consider ST SNF if pt will have to manage O2 at home.) ?  ?  ?Assistance Recommended at Discharge Frequent or constant Supervision/Assistance  ?Patient can return home with the following A little help with walking and/or transfers;A little help with bathing/dressing/bathroom;Assistance with cooking/housework;Assist for transportation;Help with stairs or ramp for entrance ?  ?Equipment Recommendations ? None recommended by PT  ?  ?Recommendations for Other Services OT consult ? ? ?  ?Precautions / Restrictions Precautions ?Precautions: Fall ?Precaution Comments: monitor O2 ?Restrictions ?Weight Bearing Restrictions: No  ?  ? ?Mobility ? Bed Mobility ?  ?  ?  ?  ?  ?  ?  ?General bed mobility comments: in chair at arrival ?  ? ?Transfers ?Overall transfer level: Needs assistance ?Equipment used: Rolling walker (2 wheels) ?Transfers: Sit to/from Stand ?Sit to Stand: Min guard ?  ?  ?  ?  ?  ?General transfer comment: Min guard for safety, lines. Cues for hand  placement. ?  ? ?Ambulation/Gait ?Ambulation/Gait assistance: Min guard ?Gait Distance (Feet): 50 Feet ?Assistive device: Rolling walker (2 wheels) ?Gait Pattern/deviations: Step-through pattern, Decreased stride length, Trunk flexed ?  ?  ?  ?General Gait Details: Min guard for safety. Intermittent assist to steady. O2 85% on RA, 87% on 2L, dyspnea 2/4. ? ? ?Stairs ?  ?  ?  ?  ?  ? ? ?Wheelchair Mobility ?  ? ?Modified Rankin (Stroke Patients Only) ?  ? ? ?  ?Balance Overall balance assessment: Needs assistance ?  ?  ?  ?  ?Standing balance support: Bilateral upper extremity supported, Reliant on assistive device for balance, During functional activity ?Standing balance-Leahy Scale: Poor ?  ?  ?  ?  ?  ?  ?  ?  ?  ?  ?  ?  ?  ? ?  ?Cognition Arousal/Alertness: Awake/alert ?Behavior During Therapy: [facility] for tasks assessed/performed ?Overall Cognitive Status: Within Functional Limits for tasks assessed ?  ?  ?  ?  ?  ?  ?  ?  ?  ?  ?  ?  ?  ?  ?  ?  ?  ?  ?  ? ?  ?Exercises   ? ?  ?General Comments   ?  ?  ? ?Pertinent Vitals/Pain Pain Assessment ?Pain Assessment: No/denies pain  ? ? ?Home Living   ?  ?  ?  ?  ?  ?  ?  ?  ?  ?   ?  ?Prior Function    ?  ?  ?   ? ?  PT Goals (current goals can now be found in the care plan section) Progress towards PT goals: Progressing toward goals ? ?  ?Frequency ? ? ? Min 3X/week ? ? ? ?  ?PT Plan Current plan remains appropriate  ? ? ?Co-evaluation   ?  ?  ?  ?  ? ?  ?AM-PAC PT "6 Clicks" Mobility   ?Outcome Measure ? Help needed turning from your back to your side while in a flat bed without using bedrails?: A Little ?Help needed moving from lying on your back to sitting on the side of a flat bed without using bedrails?: A Little ?Help needed moving to and from a bed to a chair (including a wheelchair)?: A Little ?Help needed standing up from a chair using your arms (e.g., wheelchair or bedside chair)?: A Little ?Help needed to walk in hospital room?: A Little ?Help needed climbing  3-5 steps with a railing? : A Little ?6 Click Score: 18 ? ?  ?End of Session Equipment Utilized During Treatment: Gait belt;Oxygen ?Activity Tolerance: Patient limited by fatigue ?Patient left: in chair;with call bell/phone within reach;with chair alarm set ?  ?PT Visit Diagnosis: Other abnormalities of gait and mobility (R26.89);Muscle weakness (generalized) (M62.81) ?  ? ? ?Time: 1443-1540 ?PT Time Calculation (min) (ACUTE ONLY): 24 min ? ?Charges:  $Gait Training: 23-37 mins          ?          ? ? ? ? ?Doreatha Massed, PT ?Acute Rehabilitation  ?Office: [phone] ?Pager: [phone] ? ?  ? ?

## 2021-11-14 NOTE — Evaluation (Signed)
Occupational Therapy Evaluation ?Patient Details ?Name: Ariel Medina ?MRN: [id number] ?DOB: 03-26-1928 ?Today's Date: 11/14/2021 ? ? ?History of Present Illness Pt is 88 yo female admitted on 11/07/21 with hypoxemic resp failure.  Pt with hx of HTN, breast CA, pulmonary nodule, PE/DVT, hypothyroidism, HTN, and chronic diarrhea.  ? ?Clinical Impression ?  ?Mrs. Arsenio Katz is a 88 year old woman who is modified independent at home. Her grandson lives with her but works during the day and her sister lives next door. On evaluation she presents with generalized weakness, decreased activity tolerance and impaired cardiopulmonary endurance and currently on 4 L [place]. She is min guard for ambulation and min guard to min assist for ADLs (mostly due to not having typical AE for lower body ADLs). Physically she reports she feels at about 50% but she is able to stand and ambulate in room twice with RW without dyspnea or complaints of fatigue. Her sat briefly dropped to 88% and mostly maintained at 92-93% on 4 L [place]. Her granddaughter stopped by when therapist in the room and reports she will have increased assistance at home at discharge (not 24/7) but they are still somewhat concerned about the possible need for and setup of oxygen. Patient will benefit from skilled OT services while in hospital to improve deficits and learn compensatory strategies as needed in order to return to PLOF.  Therapist tentatively recommends [facility] OT at discharge with intermittent supervision during the day in addition to having grandson at home at night. Will follow closely to try to advance patient while in hospital. Encouraged patient to use flutter and incentive hourly.  ?   ? ?Recommendations for follow up therapy are one component of a multi-disciplinary discharge planning process, led by the attending physician.  Recommendations may be updated based on patient status, additional functional criteria and insurance authorization.  ? ?Follow Up  Recommendations ? Home health OT  ?  ?Assistance Recommended at Discharge Intermittent Supervision/Assistance  ?Patient can return home with the following Assistance with cooking/housework;Help with stairs or ramp for entrance;Assist for transportation;A little help with bathing/dressing/bathroom ? ?  ?Functional Status Assessment ? Patient has had a recent decline in their functional status and demonstrates the ability to make significant improvements in function in a reasonable and predictable amount of time.  ?Equipment Recommendations ? None recommended by OT  ?  ?Recommendations for Other Services   ? ? ?  ?Precautions / Restrictions Precautions ?Precautions: Fall ?Precaution Comments: monitor O2 ?Restrictions ?Weight Bearing Restrictions: No  ? ?  ? ?Mobility Bed Mobility ?  ?  ?  ?  ?  ?  ?  ?  ?  ? ?Transfers ?  ?  ?  ?  ?  ?  ?  ?  ?  ?  ?  ? ?  ?Balance Overall balance assessment: Mild deficits observed, not formally tested ?  ?  ?  ?  ?  ?  ?  ?  ?  ?  ?  ?  ?  ?  ?  ?  ?  ?  ?   ? ?ADL either performed or assessed with clinical judgement  ? ?ADL Overall ADL's : Needs assistance/impaired ?Eating/Feeding: Independent ?  ?Grooming: Standing;Min guard ?  ?Upper Body Bathing: Set up;Sitting ?  ?Lower Body Bathing: Sit to/from stand;Minimal assistance ?  ?Upper Body Dressing : Set up;Sitting ?  ?Lower Body Dressing: Sit to/from stand;Minimal assistance ?Lower Body Dressing Details (indicate cue type and reason): assistance for  socks today but she typically uses sock aide ?Toilet Transfer: Min guard;Regular Toilet;Grab bars;Rolling walker (2 wheels) ?  ?Toileting- Water quality scientist and Hygiene: Min guard;Sit to/from stand ?  ?  ?  ?Functional mobility during ADLs: Min guard;Rolling walker (2 wheels) ?   ? ? ? ?Vision Patient Visual Report: No change from baseline ?   ?   ?Perception   ?  ?Praxis   ?  ? ?Pertinent Vitals/Pain Pain Assessment ?Pain Assessment: No/denies pain  ? ? ? ?Hand Dominance Right ?   ?Extremity/Trunk Assessment Upper Extremity Assessment ?Upper Extremity Assessment: RUE deficits/detail;LUE deficits/detail ?RUE Deficits / Details: WFL ROM, grossly 4-/5 strength ?RUE Sensation: WNL ?RUE Coordination: WNL ?LUE Deficits / Details: WFL ROM, grossly 4-/5 strength ?LUE Sensation: WNL ?LUE Coordination: WNL ?  ?Lower Extremity Assessment ?Lower Extremity Assessment: Defer to PT evaluation ?  ?Cervical / Trunk Assessment ?Cervical / Trunk Assessment: Kyphotic ?  ?Communication Communication ?Communication: HOH ?  ?Cognition Arousal/Alertness: Awake/alert ?Behavior During Therapy: [facility] for tasks assessed/performed ?Overall Cognitive Status: Within Functional Limits for tasks assessed ?  ?  ?  ?  ?  ?  ?  ?  ?  ?  ?  ?  ?  ?  ?  ?  ?  ?  ?  ?General Comments    ? ?  ?Exercises   ?  ?Shoulder Instructions    ? ? ?Home Living Family/patient expects to be discharged to:: Private residence ?Living Arrangements: Other (Comment) (Grandson lives with her but is gone a lot for work; sister lives next door) ?Available Help at Discharge: Family;Available PRN/intermittently ?Type of Home: House ?Home Access: Stairs to enter ?Entrance Stairs-Number of Steps: 1 ?  ?Home Layout: One level ?  ?  ?Bathroom Shower/Tub: Walk-in shower ?  ?  ?  ?  ?Home Equipment: Shower seat;Grab bars - tub/shower;Cane - single Barista (2 wheels);Rollator (4 wheels);BSC/3in1;Adaptive equipment ?Adaptive Equipment: Sock aid ?  ?  ? ?  ?Prior Functioning/Environment Prior Level of Function : Needs assist;Driving ?  ?  ?  ?  ?  ?  ?Mobility Comments: Uses rollator to walk; could ambulate in community ?ADLs Comments: Pt independent with dressing and toileting; sister and granddaughter help with baths; can fix light meals, uses sock aide ?  ? ?  ?  ?OT Problem List: Decreased strength;Decreased activity tolerance;Impaired balance (sitting and/or standing);Cardiopulmonary status limiting activity ?  ?   ?OT Treatment/Interventions:  Self-care/ADL training;Therapeutic exercise;DME and/or AE instruction;Therapeutic activities;Balance training;Patient/family education  ?  ?OT Goals(Current goals can be found in the care plan section) Acute Rehab OT Goals ?Patient Stated Goal: to go home ?OT Goal Formulation: With patient ?Time For Goal Achievement: 11/28/21 ?Potential to Achieve Goals: Good  ?OT Frequency: Min 2X/week ?  ? ?Co-evaluation   ?  ?  ?  ?  ? ?  ?AM-PAC OT "6 Clicks" Daily Activity     ?Outcome Measure Help from another person eating meals?: None ?Help from another person taking care of personal grooming?: A Little ?Help from another person toileting, which includes using toliet, bedpan, or urinal?: A Little ?Help from another person bathing (including washing, rinsing, drying)?: A Little ?Help from another person to put on and taking off regular upper body clothing?: A Little ?Help from another person to put on and taking off regular lower body clothing?: A Little ?6 Click Score: 19 ?  ?End of Session Equipment Utilized During Treatment: Rolling walker (2 wheels);Oxygen ? ?Activity Tolerance: Patient tolerated treatment  well ?Patient left: in chair;with call bell/phone within reach;with family/visitor present ? ?OT Visit Diagnosis: Muscle weakness (generalized) (M62.81)  ?              ?Time: 8335-8251 ?OT Time Calculation (min): 27 min ?Charges:  OT General Charges ?$OT Visit: 1 Visit ?OT Evaluation ?$OT Eval Low Complexity: 1 Low ?OT Treatments ?$Therapeutic Activity: 8-22 mins ? ?Jaquitta Dupriest, OTR/L ?Acute Care Rehab Services  ?Office [phone] ?Pager: [phone]  ? ?Katriel Cutsforth L Lachrisha Ziebarth ?11/14/2021, 4:13 PM ?

## 2021-11-14 NOTE — Progress Notes (Signed)
? ?PROGRESS NOTE ? ?Patient: Ariel Medina 88 y.o. female ?MRN: [id number] ? ?Today is hospital day 6 after presenting to ED on 11/07/2021  9:00 AM with  ?Chief Complaint  ?Patient presents with  ? Cough  ? ? ? ?RECORD REVIEW AND HOSPITAL COURSE: ?11/07/21 to ED w/ cough x1 week, no fever, dehydration d/t chronic diarrhea. Admitted for acute hypoxic respiratory failure, CXR showed L apical lung lesion chronic, followed by pulmonary, no obvious pneumonia on CXR. Rx steroids, nebs, CAP Abx to d/c if negative procalcitonin. Eliquic cont'd for hx DVT/PE.  ?11/08/21: low procal, ceftriaxone d/c and azithromycin continued, repeat CXR showed cardiomegaly, increased opacification concerning for pleural effusion / atelectasis / PNA ?11/09/21: GI consult for chronic diarrhea, started loperamide and plan for outpatient f/u and probably cholestyramine if ever off Eliquis.  ?11/10/21: Critical Care consulted for persistent hypoxemia requiring 3-4 L/min O2 - recommended continue tx viral pneumonia (metapneumovirus on PCR) arrange home O2 and follow outpatient. Started pulmicort BID. Reduced HCTZ d/t hyponatremia.  ?11/11/21: increased O2 demand, up to 11L, started on Lasic and diuresed well, sodium improved also ?4/6-7/23: still requiring O2, likely plan for D/C if able to reduce O2 requirement and arrange O2 at home. CXR 11/13/21 unchanged pleural effusions.  ? ? ?Consultants:  ?GI ?Pulmonology  ? ? ? ?SUBJECTIVE 11/14/21:  ?Patient seen and examined sitting up in chair, no apparent distress, no complaints.  Is working with physical therapy.  States shortness of breath is fairly stable, patient is in no respiratory distress, on O2 via [place] ? ? ? ? ?ASSESSMENT & PLAN ? ?Acute respiratory failure with hypoxia ([place]) ??  Underlying COPD versus acute bronchitis ?-Procalcitonin less than 0.10, ceftriaxone was discontinued ?-Continued Zithromax ?-DuoNeb nebulizer every 6 hours ?-IV Solu-Medrol 40 mg every 12 hours was changed to prednisone 40 mg  daily  ?-Continue Mucinex to 1200 mg p.o. twice daily  ?-BNP 80.7, respiratory panel showed metapneumovirus ?-Chest x-ray shows atelectasis versus pneumonia at lung bases ?-Flutter valve every 4 hours ?-She was started on Pulmicort nebulizer twice daily ?-Appreciate pulmonary input, they recommend discharging home on oxygen and then follow-up pulmonology as outpatient ?-On 4/5 oxygen requirement went up to 11 L/min of oxygen via high flow nasal cannula ?- Started on Lasix 20 mg IV every 12 hours with excellent diuresis, diuresed well over 5 L on IV Lasix, Lasix was d/c ?-Oxygen requirement has come down to 3 to 4 L/min HFNC, still desatting on 2L O2 via [place] w/ ambulation ?-PT recommending likely needs SNF for rehab, TOC made aware  ? ?VTE (venous thromboembolism) ?- Continue Eliquis ? ?Chronic diarrhea ?- Patient has chronic diarrhea after colectomy ?-GI consulted, started on Imodium with improvement ? ?Essential hypertension ?- Patient is currently on HCTZ 25 mg daily ?-HCTZ was cut down to to 12.5 mg daily due to hyponatremia and hypokalemia ?-Also started on  amlodipine 5 mg daily ? ?Hypothyroidism ?- Continue Synthroid ? ?Lung mass ?- Patient has history of primary malignant neoplasm of left upper lobe of lung ?-She was seen by radiation oncology, underwent radiation treatments ?-Not a candidate for chemotherapy ?-Did not undergo biopsy ?-Last CT scan from January 2023 showed interval increase in size of lung mass, no further intervention recommended by radiation oncology ?-Follow-up radiation oncology as outpatient ? ?Hyponatremia ?- Sodium was 132 due to HCTZ ?-Dose of HCTZ changed to 12.5 mg daily ?-Sodium today is 136 ? ? ?VTE Ppx: apixiban ?CODE STATUS: DNI ?Admitted from: home ?Expected Dispo: SNF per  last TOC note ?Barriers to discharge: continued O2 requiremend, will need to arrange home O2 ?Family communication: none at this time  ? ? ? ? ? ? ? ? ? ? ? ? ? ?Objective: ?Vital signs in last 24  hours: ?Temp:  [97.3 ?F (36.3 ?C)-98.3 ?F (36.8 ?C)] 98.3 ?F (36.8 ?C) (04/08 0507) ?Pulse Rate:  [70-88] 70 (04/08 0507) ?Resp:  [18-19] 19 (04/08 0507) ?BP: (125-157)/(79-104) 148/88 (04/08 0507) ?SpO2:  [92 %-100 %] 100 % (04/08 0507) ?Weight change:  ?Last BM Date : 11/13/21 ? ?Intake/Output from previous day: ?04/07 0701 - 04/08 0700 ?In: 1080 [P.O.:1080] ?Out: 900 [Urine:900] ?Intake/Output this shift: ?No intake/output data recorded. ? ?General appearance: alert, cooperative, and appears stated age ?Resp: diminished breath sounds bibasilar and rhonchi LLL and RLL ?Cardio: regular rate and rhythm, S1, S2 normal, no murmur, click, rub or gallop ?Extremities: extremities normal, atraumatic, no cyanosis or edema ? ?Lab Results: ?No results for input(s): WBC, HGB, HCT, PLT in the last 72 hours. ?BMET ?Recent Labs  ?  11/12/21 ?0814 11/13/21 ?0342  ?NA 136 135  ?K 3.8 3.9  ?CL 90* 91*  ?CO2 39* 37*  ?GLUCOSE 111* 94  ?BUN 18 26*  ?CREATININE 0.68 0.68  ?CALCIUM 9.5 9.2  ? ? ?Studies/Results: ?DG Chest Port 1 View ? ?Result Date: 11/13/2021 ?CLINICAL DATA:  Dyspnea EXAM: PORTABLE CHEST 1 VIEW COMPARISON:  Chest x-ray dated November 08, 2021 FINDINGS: Cardiac and mediastinal contours are unchanged. Vertical linear opacity of the right hemithorax, likely skin fold artifact. Unchanged small bilateral pleural effusions and atelectasis. Stable nodular opacity of the left upper lobe. No pneumothorax. IMPRESSION: Small bilateral pleural effusions and atelectasis, unchanged compared prior exam. Electronically Signed   By: Yetta Glassman M.D.   On: 11/13/2021 09:36   ? ?Medications: Scheduled: ? amLODipine  5 mg Oral Daily  ? apixaban  5 mg Oral BID  ? budesonide (PULMICORT) nebulizer solution  0.25 mg Nebulization BID  ? guaiFENesin  1,200 mg Oral BID  ? hydrochlorothiazide  12.5 mg Oral Daily  ? levothyroxine  75 mcg Oral Q0600  ? loperamide  4 mg Oral Daily  ? mouth rinse  15 mL Mouth Rinse BID  ? multivitamin  1 tablet Oral  Daily  ? pantoprazole  40 mg Oral Daily  ? potassium chloride  20 mEq Oral BID  ? predniSONE  40 mg Oral Q breakfast  ? ?Continuous: ?CHE:NIDPOEUMPNTIR **OR** acetaminophen, benzonatate, ipratropium-albuterol ?Anti-infectives (From admission, onward)  ? ? Start     Dose/Rate Route Frequency Ordered Stop  ? 11/07/21 1700  cefTRIAXone (ROCEPHIN) 2 g in sodium chloride 0.9 % 100 mL IVPB  Status:  Discontinued       ? 2 g ?200 mL/hr over 30 Minutes Intravenous Daily 11/07/21 1553 11/08/21 1141  ? 11/07/21 1645  azithromycin (ZITHROMAX) tablet 500 mg       ? 500 mg Oral Daily 11/07/21 1553 11/11/21 1024  ? ?  ? ? ? LOS: 6 days  ? ?Emeterio Reeve ?11/14/2021, 7:56 AM ? ? ?

## 2021-11-15 DIAGNOSIS — J9601 Acute respiratory failure with hypoxia: Secondary | ICD-10-CM | POA: Diagnosis not present

## 2021-11-15 NOTE — Plan of Care (Signed)
?  Problem: Clinical Measurements: ?Goal: Will remain free from infection ?Outcome: Progressing ?Goal: Diagnostic test results will improve ?Outcome: Progressing ?Goal: Respiratory complications will improve ?Outcome: Progressing ?  ?Problem: Activity: ?Goal: Risk for activity intolerance will decrease ?Outcome: Progressing ?  ?

## 2021-11-15 NOTE — Progress Notes (Addendum)
? ?PROGRESS NOTE ? ?Patient: Ariel Medina 88 y.o. female ?MRN: [id number] ? ?Today is hospital day 7 after presenting to ED on 11/07/2021  9:00 AM with  ?Chief Complaint  ?Patient presents with  ? Cough  ? ? ? ?RECORD REVIEW AND HOSPITAL COURSE: ?11/07/21 to ED w/ cough x1 week, no fever, dehydration d/t chronic diarrhea. Admitted for acute hypoxic respiratory failure, CXR showed L apical lung lesion chronic, followed by pulmonary, no obvious pneumonia on CXR. Rx steroids, nebs, CAP Abx to d/c if negative procalcitonin. Eliquic cont'd for hx DVT/PE.  ?11/08/21: low procal, ceftriaxone d/c and azithromycin continued, repeat CXR showed cardiomegaly, increased opacification concerning for pleural effusion / atelectasis / PNA ?11/09/21: GI consult for chronic diarrhea, started loperamide and plan for outpatient f/u and probably cholestyramine if ever off Eliquis.  ?11/10/21: Critical Care consulted for persistent hypoxemia requiring 3-4 L/min O2 - recommended continue tx viral pneumonia (metapneumovirus on PCR) arrange home O2 and follow outpatient. Started pulmicort BID. Reduced HCTZ d/t hyponatremia.  ?11/11/21: increased O2 demand, up to 11L, started on Lasic and diuresed well, sodium improved also ?4/6-8/23: still requiring O2, likely plan for D/C if able to reduce O2 requirement and arrange O2 at home. CXR 11/13/21 unchanged pleural effusions.  ?11/15/21: PT saw her yesterday O2 87% on 2L [place] with ambulation, home/HH ok unless pt will have to manage O2 in which case SNF recommended. This AM remains afebrile, SpO2 95-100% on 3L [place] at rest. Need to arrange home O2 for tomorrow  ? ? ?Consultants:  ?GI ?Pulmonology  ? ? ? ?SUBJECTIVE 11/15/21:  ?Patient seen and examined resting comfortably in chair at bedside, no apparent distress.  She is currently on 4 L O2 via [place].  She is in good spirits, no increased work of breathing, speaking in complete sentences.  She states that she would like to go home if possible though she is nervous  about managing oxygen there, she is amenable to SNF but would rather avoid this if she can.  She states that no one is home today who could receive DME/O2, but her grandson should be around tomorrow ? ? ? ? ?ASSESSMENT & PLAN ? ?Acute respiratory failure with hypoxia ([place]) ??  Underlying COPD versus acute bronchitis ?-Procalcitonin less than 0.10, ceftriaxone was discontinued ?-Continued Zithromax, start date 11/07/21, d/c 11/11/21 ?-DuoNeb nebulizer every 6 hours ?-IV Solu-Medrol 40 mg every 12 hours was changed to prednisone 40 mg daily  ?-Continue Mucinex to 1200 mg p.o. twice daily  ?-BNP 80.7, respiratory panel showed metapneumovirus ?-Chest x-ray shows atelectasis versus pneumonia at lung bases ?-Flutter valve every 4 hours ?-She was started on Pulmicort nebulizer twice daily ?-Appreciate pulmonary input, they recommend discharging home on oxygen and then follow-up pulmonology as outpatient ?-On 4/5 oxygen requirement went up to 11 L/min of oxygen via high flow nasal cannula, started Lasix 20 mg IV every 12 hours with excellent diuresis, diuresed well over 5 L on IV Lasix, Lasix was d/c ?-Oxygen requirement has come down to 3 to 4 L/min HFNC, still desatting on 2L O2 via [place] w/ ambulation ?-PT recommending likely needs SNF if there are significant barriers to providing O2 at home, [facility] will work on home oxygen, hopefully patient can be discharged home tomorrow, 4/10, if continues to be stable ? ?VTE (venous thromboembolism) ?- Continue Eliquis ? ?Chronic diarrhea ?- Patient has chronic diarrhea after colectomy ?-GI consulted, started on Imodium with improvement ? ?Essential hypertension ?- Patient is currently on HCTZ 25 mg  daily ?-HCTZ was cut down to to 12.5 mg daily due to hyponatremia and hypokalemia ?-Also started on  amlodipine 5 mg daily ? ?Hypothyroidism ?- Continue Synthroid ? ?Lung mass ?- Patient has history of primary malignant neoplasm of left upper lobe of lung ?-She was seen by radiation oncology,  underwent radiation treatments ?-Not a candidate for chemotherapy ?-Did not undergo biopsy ?-Last CT scan from January 2023 showed interval increase in size of lung mass, no further intervention recommended by radiation oncology ?-Follow-up radiation oncology as outpatient ? ?Hyponatremia ?- Sodium was 132 due to HCTZ ?-Dose of HCTZ changed to 12.5 mg daily ?-Sodium improved to 136 ? ? ?VTE Ppx: apixiban ?CODE STATUS: DNI ?Admitted from: home ?Expected Dispo: home w/ HH / O2. Today transferring off progressive to med-surg, continue telemetry   ?Barriers to discharge: continued O2 requirement, will need to arrange home O2, TOC states should be able to arrange this tomorrow 4/10 ?Family communication: none at this time  ? ? ? ? ? ? ? ? ? ? ? ? ? ?Objective: ?Vital signs in last 24 hours: ?Temp:  [97.7 ?F (36.5 ?C)-98.2 ?F (36.8 ?C)] 98.2 ?F (36.8 ?C) (04/09 0444) ?Pulse Rate:  [68-80] 68 (04/09 0444) ?Resp:  [18-26] 20 (04/09 0444) ?BP: (121-151)/(76-85) 151/85 (04/09 0444) ?SpO2:  [95 %-100 %] 99 % (04/09 0444) ?FiO2 (%):  [32 %] 32 % (04/08 0937) ?Weight change:  ?Last BM Date : 11/14/21 ? ?Intake/Output from previous day: ?04/08 0701 - 04/09 0700 ?In: 1000 [P.O.:1000] ?Out: 1550 [OEUMP:5361] ?Intake/Output this shift: ?No intake/output data recorded. ? ?General appearance: alert, cooperative, and appears stated age ?Resp: diminished breath sounds bibasilar and rhonchi LLL and RLL ?Cardio: regular rate and rhythm, S1, S2 normal, no murmur, click, rub or gallop ?Extremities: extremities normal, atraumatic, no cyanosis or edema ? ?Lab Results: ?No results for input(s): WBC, HGB, HCT, PLT in the last 72 hours. ?BMET ?Recent Labs  ?  11/12/21 ?0814 11/13/21 ?0342  ?NA 136 135  ?K 3.8 3.9  ?CL 90* 91*  ?CO2 39* 37*  ?GLUCOSE 111* 94  ?BUN 18 26*  ?CREATININE 0.68 0.68  ?CALCIUM 9.5 9.2  ? ? ? ?Studies/Results: ?DG Chest Port 1 View ? ?Result Date: 11/13/2021 ?CLINICAL DATA:  Dyspnea EXAM: PORTABLE CHEST 1 VIEW COMPARISON:   Chest x-ray dated November 08, 2021 FINDINGS: Cardiac and mediastinal contours are unchanged. Vertical linear opacity of the right hemithorax, likely skin fold artifact. Unchanged small bilateral pleural effusions and atelectasis. Stable nodular opacity of the left upper lobe. No pneumothorax. IMPRESSION: Small bilateral pleural effusions and atelectasis, unchanged compared prior exam. Electronically Signed   By: Yetta Glassman M.D.   On: 11/13/2021 09:36   ? ?Medications: Scheduled: ? amLODipine  5 mg Oral Daily  ? apixaban  5 mg Oral BID  ? budesonide (PULMICORT) nebulizer solution  0.25 mg Nebulization BID  ? guaiFENesin  1,200 mg Oral BID  ? hydrochlorothiazide  12.5 mg Oral Daily  ? levothyroxine  75 mcg Oral Q0600  ? loperamide  4 mg Oral Daily  ? mouth rinse  15 mL Mouth Rinse BID  ? multivitamin  1 tablet Oral Daily  ? pantoprazole  40 mg Oral Daily  ? potassium chloride  20 mEq Oral BID  ? predniSONE  40 mg Oral Q breakfast  ? ?Continuous: ?WER:[id number] **OR** acetaminophen, benzonatate, ipratropium-albuterol ?Anti-infectives (From admission, onward)  ? ? Start     Dose/Rate Route Frequency Ordered Stop  ? 11/07/21 1700  cefTRIAXone (ROCEPHIN)  2 g in sodium chloride 0.9 % 100 mL IVPB  Status:  Discontinued       ? 2 g ?200 mL/hr over 30 Minutes Intravenous Daily 11/07/21 1553 11/08/21 1141  ? 11/07/21 1645  azithromycin (ZITHROMAX) tablet 500 mg       ? 500 mg Oral Daily 11/07/21 1553 11/11/21 1024  ? ?  ? ? ? LOS: 7 days  ? ?Emeterio Reeve ?11/15/2021, 7:37 AM ? ? ?

## 2021-11-15 NOTE — Progress Notes (Signed)
SATURATION QUALIFICATIONS: (This note is used to comply with regulatory documentation for home oxygen) ? ?Patient Saturations on Room Air at Rest = 87% ? ?Patient Saturations on Room Air while Ambulating = NA ? ?Patient Saturations on 4 Liters of oxygen while Ambulating = 91% ? ?Please briefly explain why patient needs home oxygen: Unable to maintain functional levels of o2 saturation without oxygen at rest or with ambulation. ? ? ?Occupational Therapy Treatment ?Patient Details ?Name: Ariel Medina ?MRN: [id number] ?DOB: 1928/01/07 ?Today's Date: 11/15/2021 ? ? ?History of present illness Pt is 88 yo female admitted on 11/07/21 with hypoxemic resp failure.  Pt with hx of HTN, breast CA, pulmonary nodule, PE/DVT, hypothyroidism, HTN, and chronic diarrhea. ?  ?OT comments ? Patient exhibits good activity tolerance without complaints of shortness of breath or fatigue. She is still requiring 4 L [place] to maintain oxygen above 90%. During therapy treatment she maintained between 90-92% after prolonged standing and ambulation. RN reports she had been satting at 95-96% at rest. Patient family in room and discussed POC. Family reports she can have increased supervision at home at discharge. HH is an appropriate discharge plan with increased supervision - she hasn't been requiring physical assistance - mostly supervision to monitor o2 line and sats.   ? ?Recommendations for follow up therapy are one component of a multi-disciplinary discharge planning process, led by the attending physician.  Recommendations may be updated based on patient status, additional functional criteria and insurance authorization. ?   ?Follow Up Recommendations ? Home health OT  ?  ?Assistance Recommended at Discharge Frequent or constant Supervision/Assistance  ?Patient can return home with the following ? Assistance with cooking/housework;Help with stairs or ramp for entrance;Assist for transportation;A little help with bathing/dressing/bathroom ?   ?Equipment Recommendations ? None recommended by OT  ?  ?Recommendations for Other Services   ? ?  ?Precautions / Restrictions Precautions ?Precautions: Fall ?Precaution Comments: monitor O2 ?Restrictions ?Weight Bearing Restrictions: No  ? ? ?  ? ?Mobility Bed Mobility ?  ?  ?  ?  ?  ?  ?  ?  ?  ? ?Transfers ?  ?  ?  ?  ?  ?  ?  ?  ?  ?  ?  ?  ?Balance Overall balance assessment: Mild deficits observed, not formally tested ?  ?  ?  ?  ?  ?  ?  ?  ?  ?  ?  ?  ?  ?  ?  ?  ?  ?  ?   ? ?ADL either performed or assessed with clinical judgement  ? ?ADL Overall ADL's : Needs assistance/impaired ?  ?  ?  ?  ?  ?  ?  ?  ?  ?  ?  ?  ?  ?  ?Toileting- Clothing Manipulation and Hygiene: Total assistance ?Toileting - Clothing Manipulation Details (indicate cue type and reason): Patient found to have accidental incontinence of BM due to urgency and was unable to make it to bathroom. Patient provided with total assistance to take off mesh underwear and clean up BM - while patient maintained standing approx 7-8 minutes. ?  ?  ?Functional mobility during ADLs: Min guard;Rolling walker (2 wheels) ?General ADL Comments: Patient able to maintain standing 7-8 minutes while getting cleaned up. Sat down or rest break before ambulating for o2 sat test on 4 L. Ambulated approx 50 feet without significant complaints of fatigue or shortness of breath. ?  ? ?Extremity/Trunk Assessment   ?  ?  ?  ?  ?  ? ?  Vision Patient Visual Report: No change from baseline ?  ?  ?Perception   ?  ?Praxis   ?  ? ?Cognition Arousal/Alertness: Awake/alert ?Behavior During Therapy: [facility] for tasks assessed/performed ?Overall Cognitive Status: Within Functional Limits for tasks assessed ?  ?  ?  ?  ?  ?  ?  ?  ?  ?  ?  ?  ?  ?  ?  ?  ?  ?  ?  ?   ?Exercises   ? ?  ?Shoulder Instructions   ? ? ?  ?General Comments    ? ? ?Pertinent Vitals/ Pain       Pain Assessment ?Pain Assessment: No/denies pain ? ?Home Living   ?  ?  ?  ?  ?  ?  ?  ?  ?  ?  ?  ?  ?  ?  ?  ?  ?   ?  ? ?  ?Prior Functioning/Environment    ?  ?  ?  ?   ? ?Frequency ?    ? ? ? ? ?  ?Progress Toward Goals ? ?OT Goals(current goals can now be found in the care plan section) ? Progress towards OT goals: Progressing toward goals ? ?Acute Rehab OT Goals ?Patient Stated Goal: go home ?OT Goal Formulation: With patient ?Time For Goal Achievement: 11/28/21 ?Potential to Achieve Goals: Good  ?Plan Discharge plan remains appropriate   ? ?Co-evaluation ? ? ?   ?  ?  ?  ?  ? ?  ?AM-PAC OT "6 Clicks" Daily Activity     ?Outcome Measure ? ? Help from another person eating meals?: None ?Help from another person taking care of personal grooming?: A Little ?Help from another person toileting, which includes using toliet, bedpan, or urinal?: A Little ?Help from another person bathing (including washing, rinsing, drying)?: A Little ?Help from another person to put on and taking off regular upper body clothing?: A Little ?Help from another person to put on and taking off regular lower body clothing?: A Little ?6 Click Score: 19 ? ?  ?End of Session Equipment Utilized During Treatment: Rolling walker (2 wheels);Oxygen ? ?OT Visit Diagnosis: Muscle weakness (generalized) (M62.81) ?  ?Activity Tolerance Patient tolerated treatment well ?  ?Patient Left in chair;with call bell/phone within reach;with family/visitor present ?  ?Nurse Communication Mobility status (o2 sats) ?  ? ?   ? ?Time: 5974-1638 ?OT Time Calculation (min): 35 min ? ?Charges: OT General Charges ?$OT Visit: 1 Visit ?OT Treatments ?$Self Care/Home Management : 8-22 mins ?$Therapeutic Activity: 23-37 mins ? ?Antony Sian, OTR/L ?Acute Care Rehab Services  ?Office [phone] ?Pager: [phone]  ? ?[name] ?11/15/2021, 4:09 PM ?

## 2021-11-16 ENCOUNTER — Other Ambulatory Visit: Payer: Self-pay | Admitting: Internal Medicine

## 2021-11-16 DIAGNOSIS — K529 Noninfective gastroenteritis and colitis, unspecified: Secondary | ICD-10-CM | POA: Diagnosis not present

## 2021-11-16 DIAGNOSIS — J123 Human metapneumovirus pneumonia: Secondary | ICD-10-CM

## 2021-11-16 DIAGNOSIS — E039 Hypothyroidism, unspecified: Secondary | ICD-10-CM

## 2021-11-16 DIAGNOSIS — I10 Essential (primary) hypertension: Secondary | ICD-10-CM | POA: Diagnosis not present

## 2021-11-16 DIAGNOSIS — J9601 Acute respiratory failure with hypoxia: Secondary | ICD-10-CM | POA: Diagnosis not present

## 2021-11-16 MED ORDER — POTASSIUM CHLORIDE CRYS ER 10 MEQ PO TBCR: 10.0000 meq | EXTENDED_RELEASE_TABLET | ORAL | Status: DC

## 2021-11-16 MED ORDER — HYDROCHLOROTHIAZIDE 25 MG PO TABS: 12.5000 mg | ORAL_TABLET | Freq: Every day | ORAL | Status: DC

## 2021-11-16 MED ORDER — GUAIFENESIN ER 600 MG PO TB12: 1200.0000 mg | ORAL_TABLET | Freq: Two times a day (BID) | ORAL | 0 refills | Status: DC

## 2021-11-16 MED ORDER — AMLODIPINE BESYLATE 5 MG PO TABS: 5.0000 mg | ORAL_TABLET | Freq: Every day | ORAL | 0 refills | Status: DC

## 2021-11-16 MED ORDER — BENZONATATE 100 MG PO CAPS: 100.0000 mg | ORAL_CAPSULE | Freq: Three times a day (TID) | ORAL | 0 refills | Status: DC | PRN

## 2021-11-16 MED ORDER — BUDESONIDE-FORMOTEROL FUMARATE 160-4.5 MCG/ACT IN AERO
2.0000 | INHALATION_SPRAY | Freq: Two times a day (BID) | RESPIRATORY_TRACT | 0 refills | Status: DC
Start: 2021-11-16 — End: 2022-03-30

## 2021-11-16 MED ORDER — LOPERAMIDE HCL 2 MG PO CAPS: 4.0000 mg | ORAL_CAPSULE | Freq: Every day | ORAL | 0 refills | Status: DC

## 2021-11-16 MED ORDER — ALBUTEROL SULFATE HFA 108 (90 BASE) MCG/ACT IN AERS: 2.0000 | INHALATION_SPRAY | Freq: Four times a day (QID) | RESPIRATORY_TRACT | 0 refills | Status: AC | PRN

## 2021-11-16 NOTE — Progress Notes (Signed)
Occupational Therapy Treatment ?Patient Details ?Name: Ariel Medina ?MRN: [id number] ?DOB: [birth year] ?Today's Date: 11/16/2021 ? ? ?History of present illness Pt is 88 yo female admitted on 11/07/21 with hypoxemic resp failure.  Pt with hx of HTN, breast CA, pulmonary nodule, PE/DVT, hypothyroidism, HTN, and chronic diarrhea. ?  ?OT comments ? Patient was able to engage in standing tolerance challenges with education on balance with noted episode of urine incontinence impacting session. Patient was SUP for clothing management and hygiene tasks with one UE support on RW.patient reported plan was to transition home with family support today.  Patient would continue to benefit from skilled OT services at this time while admitted and after d/c to address noted deficits in order to improve overall safety and independence in ADLs.  ?  ? ?Recommendations for follow up therapy are one component of a multi-disciplinary discharge planning process, led by the attending physician.  Recommendations may be updated based on patient status, additional functional criteria and insurance authorization. ?   ?Follow Up Recommendations ? Home health OT  ?  ?Assistance Recommended at Discharge Frequent or constant Supervision/Assistance  ?Patient can return home with the following ? Assistance with cooking/housework;Help with stairs or ramp for entrance;Assist for transportation;A little help with bathing/dressing/bathroom ?  ?Equipment Recommendations ? None recommended by OT  ?  ?Recommendations for Other Services   ? ?  ?Precautions / Restrictions Precautions ?Precautions: Fall ?Precaution Comments: monitor O2 ?Restrictions ?Weight Bearing Restrictions: No  ? ? ?  ? ?Mobility Bed Mobility ?  ?  ?  ?  ?  ?  ?  ?General bed mobility comments: up in recliner at start of session and returned to the same ?  ? ?Transfers ?  ?  ?  ?  ?  ?  ?  ?  ?  ?  ?  ?  ?Balance Overall balance assessment: Mild deficits observed, not formally tested ?  ?  ?   ?  ?  ?  ?  ?  ?  ?  ?  ?  ?  ?  ?  ?  ?  ?  ?   ? ?ADL either performed or assessed with clinical judgement  ? ?ADL Overall ADL's : Needs assistance/impaired ?  ?  ?Grooming: Standing;Min guard;Wash/dry face ?  ?  ?  ?  ?  ?  ?  ?  ?Lower Body Dressing Details (indicate cue type and reason): patient was max A for socks with patient reporting usually using sock aid. patient able to push down soiled mesh underwear with min guard to knees with assist to getover feet. ?  ?  ?Toileting- Water quality scientist and Hygiene: Min guard;Sit to/from stand ?Toileting - Clothing Manipulation Details (indicate cue type and reason): with one UE support with RW. ?  ?  ?  ?  ?  ? ?Extremity/Trunk Assessment   ?  ?  ?  ?  ?  ? ?Vision   ?  ?  ?Perception   ?  ?Praxis   ?  ? ?Cognition Arousal/Alertness: Awake/alert ?Behavior During Therapy: [facility] for tasks assessed/performed ?Overall Cognitive Status: Within Functional Limits for tasks assessed ?  ?  ?  ?  ?  ?  ?  ?  ?  ?  ?  ?  ?  ?  ?  ?  ?  ?  ?  ?   ?Exercises   ? ?  ?Shoulder Instructions   ? ? ?  ?General Comments    ? ? ?  Pertinent Vitals/ Pain       Pain Assessment ?Pain Assessment: No/denies pain ? ?Home Living   ?  ?  ?  ?  ?  ?  ?  ?  ?  ?  ?  ?  ?  ?  ?  ?  ?  ?  ? ?  ?Prior Functioning/Environment    ?  ?  ?  ?   ? ?Frequency ? Min 2X/week  ? ? ? ? ?  ?Progress Toward Goals ? ?OT Goals(current goals can now be found in the care plan section) ? Progress towards OT goals: Progressing toward goals ? ?   ?Plan Discharge plan remains appropriate   ? ?Co-evaluation ? ? ?   ?  ?  ?  ?  ? ?  ?AM-PAC OT "6 Clicks" Daily Activity     ?Outcome Measure ? ? Help from another person eating meals?: None ?Help from another person taking care of personal grooming?: A Little ?Help from another person toileting, which includes using toliet, bedpan, or urinal?: A Little ?Help from another person bathing (including washing, rinsing, drying)?: A Little ?Help from another person to put on and  taking off regular upper body clothing?: A Little ?Help from another person to put on and taking off regular lower body clothing?: A Little ?6 Click Score: 19 ? ?  ?End of Session Equipment Utilized During Treatment: Rolling walker (2 wheels);Oxygen ? ?OT Visit Diagnosis: Muscle weakness (generalized) (M62.81) ?  ?Activity Tolerance Patient tolerated treatment well ?  ?Patient Left in chair;with call bell/phone within reach;with family/visitor present ?  ?Nurse Communication Mobility status ?  ? ?   ? ?Time: 2119-4174 ?OT Time Calculation (min): 22 min ? ?Charges: OT General Charges ?$OT Visit: 1 Visit ?OT Treatments ?$Self Care/Home Management : 8-22 mins ? ?Sriansh Farra OTR/L, MS ?Acute Rehabilitation Department ?Office# [phone] ?Pager# [phone] ? ? ?[place] ?11/16/2021, 12:59 PM ?

## 2021-11-16 NOTE — TOC Transition Note (Signed)
Transition of Care (TOC) - CM/SW Discharge Note ? ? ?Patient Details  ?Name: Ariel Medina ?MRN: [id number] ?Date of Birth: 12-06-1927 ? ?Transition of Care (TOC) CM/SW Contact:  ?Tawanna Cooler, RN ?Phone Number: ?11/16/2021, 12:11 PM ? ? ?Clinical Narrative:    ? ?[employer] to deliver the home O2 to room and will teach patient and granddaughter [name]) how to use it.   ? ?Spoke with patient and [name] regarding home health. Sent referral to [facility].   ? ? ? ?Final next level of care: [place] ?Barriers to Discharge: Barriers Resolved ? ? ?Patient Goals and CMS Choice ?  ?CMS Medicare.gov Compare Post Acute Care list provided to:: Patient ?Choice offered to / list presented to : Patient ? ? ?Discharge Plan and Services ?   ?           ?DME Arranged: Oxygen ?DME Agency: [employer] ?Date DME Agency Contacted: 11/16/21 ?Time DME Agency Contacted: [phone] ?Representative spoke with at DME Agency: Andee Poles ?HH Arranged: PT ?[place] Agency: [facility] ?Date HH Agency Contacted: 11/16/21 ?Time [place]: 2419 ?Representative spoke with at [place]: Delsa Sale ? ?S ? ? ? ?

## 2021-11-16 NOTE — Care Management Important Message (Signed)
Important Message ? ?Patient Details IM Letter placed in Patients room. ?Name: Ariel Medina ?MRN: [id number] ?Date of Birth: June 22, 1928 ? ? ?Medicare Important Message Given:  Yes ? ? ? ? ?Kerin Salen ?11/16/2021, 1:53 PM ?

## 2021-11-16 NOTE — Plan of Care (Signed)
?  Problem: Education: ?Goal: Knowledge of General Education information will improve ?Description: Including pain rating scale, medication(s)/side effects and non-pharmacologic comfort measures ?Outcome: Progressing ?  ?Problem: Clinical Measurements: ?Goal: Will remain free from infection ?Outcome: Progressing ?Goal: Diagnostic test results will improve ?Outcome: Progressing ?  ?Problem: Activity: ?Goal: Risk for activity intolerance will decrease ?Outcome: Progressing ?  ?Problem: Clinical Measurements: ?Goal: Respiratory complications will improve ?Outcome: Adequate for Discharge ?  ?

## 2021-11-16 NOTE — Discharge Summary (Signed)
Physician Discharge Summary  ?Ariel Medina NGE:[id number] DOB: 1927-12-13 DOA: 11/07/2021 ? ?PCP: Colon Branch, MD ? ?Admit date: 11/07/2021 ?Discharge date: 11/16/2021 ? ?Admitted From: Home ?Disposition: Home ? ?Recommendations for Outpatient Follow-up:  ?Follow up with PCP in 1 week with repeat CBC/BMP ?Outpatient follow-up with pulmonary/GI ?Recommend outpatient evaluation and follow-up with palliative care for goals of care discussion ?Follow up in ED if symptoms worsen or new appear ? ? ?Home Health: Home health PT/OT ?Equipment/Devices: Oxygen via nasal cannula at 4 L/min ? ?Discharge Condition: Guarded  ?CODE STATUS: DNR.  Verified CODE STATUS with the patient this morning.   ?Diet recommendation: Heart healthy ? ?Brief/Interim Summary: ?88 year old female with history of hypertension, breast cancer, pulmonary nodule, PE/DVT on Eliquis, hypothyroidism, hypertension, chronic diarrhea presented with cough and shortness of breath for 1 week.  On presentation, she was found to be in acute hypoxic respiratory failure requiring 4 L oxygen.  She was treated for COPD exacerbation and initially started on IV steroids and antibiotics.  Procalcitonin less than 0.1, Rocephin discontinued.  Subsequently oxygen requirement worsened.  Pulmonary was consulted.  Respiratory virus panel was positive for metapneumovirus.  She was treated with IV Lasix and diuresed well and subsequently discontinued.  GI was consulted for chronic diarrhea and recommended Imodium and outpatient follow-up with GI.  She has been switched to oral prednisone and her respiratory status has improved but patient remains on 4 L oxygen.  She will be discharged home today on supplemental oxygen.  Outpatient follow-up with PCP/pulmonary/GI.  Will benefit from palliative care evaluation and follow-up.  No need for any more steroids on discharge. ? ?Discharge Diagnoses:  ? ?Acute hypoxic respiratory failure ?COPD exacerbation ?Metapneumovirus acute  bronchitis/pneumonia ?-Patient has been treated with IV Solu-Medrol which has subsequently been switched to oral prednisone along with nebs.  Initially started on broad-spectrum antibiotics but subsequently Rocephin was discontinued because of procalcitonin being less than 0.1. ?-Pulmonary was consulted.  Respiratory virus panel was positive for metapneumovirus.  She was treated with IV Lasix and diuresed well and subsequently discontinued.  ?-her respiratory status has improved but patient remains on 4 L oxygen.  She will be discharged home today on supplemental oxygen.   ?-Outpatient follow-up with pulmonary ? ?Hyponatremia ?-Possibly from hydrochlorothiazide use.  Dose has been decreased to 12.5 mg daily.  Last sodium level was 135 on 11/13/2021.  Outpatient follow-up ? ?Lung mass ?-- Patient has history of primary malignant neoplasm of left upper lobe of lung ?-She was seen by radiation oncology, underwent radiation treatments ?-Not a candidate for chemotherapy ?-Did not undergo biopsy ?-Last CT scan from January 2023 showed interval increase in size of lung mass, no further intervention recommended by radiation oncology ?-Follow-up radiation oncology as outpatient ?-Recommend outpatient evaluation and follow-up by palliative care ? ?Chronic diarrhea ?-- Patient has chronic diarrhea after colectomy ?-GI consulted, started on Imodium with improvement ?-Outpatient follow-up with GI ? ?History of PE/DVT ?-Continue Eliquis ? ?Essential hypertension ?-Continue decreased dose of hydrochlorothiazide.  Amlodipine 5 mg daily will be continued on discharge. ? ?Hypothyroidism ?-Continue Synthroid ? ?Discharge Instructions ? ?Discharge Instructions   ? ? Amb Referral to Palliative Care   Complete by: As directed ?  ? Ambulatory referral to Pulmonology   Complete by: As directed ?  ? Hospital followup  ? Reason for referral: Asthma/COPD  ? Diet - low sodium heart healthy   Complete by: As directed ?  ? Increase activity slowly    Complete by: As directed ?  ? ?  ? ?  Allergies as of 11/16/2021   ? ?   Reactions  ? Ibuprofen   ? REACTION: bleed  ? ?  ? ?  ?Medication List  ?  ? ?STOP taking these medications   ? ?cyclobenzaprine 5 MG tablet ?Commonly known as: FLEXERIL ?  ?feeding supplement Liqd ?  ? ?  ? ?TAKE these medications   ? ?albuterol 108 (90 Base) MCG/ACT inhaler ?Commonly known as: VENTOLIN HFA ?Inhale 2 puffs into the lungs every 6 (six) hours as needed for wheezing or shortness of breath. ?  ?amLODipine 5 MG tablet ?Commonly known as: NORVASC ?Take 1 tablet (5 mg total) by mouth daily. ?Start taking on: November 17, 2021 ?  ?benzonatate 100 MG capsule ?Commonly known as: TESSALON ?Take 1 capsule (100 mg total) by mouth 3 (three) times daily as needed for cough. ?  ?budesonide-formoterol 160-4.5 MCG/ACT inhaler ?Commonly known as: Symbicort ?Inhale 2 puffs into the lungs in the morning and at bedtime. ?  ?Eliquis 5 MG Tabs tablet ?Generic drug: apixaban ?TAKE 1 TABLET TWICE DAILY ?  ?fluticasone 50 MCG/ACT nasal spray ?Commonly known as: FLONASE ?Place 2 sprays into both nostrils daily as needed for allergies or rhinitis. ?  ?guaiFENesin 600 MG 12 hr tablet ?Commonly known as: [place] ?Take 2 tablets (1,200 mg total) by mouth 2 (two) times daily. ?  ?hydrochlorothiazide 25 MG tablet ?Commonly known as: HYDRODIURIL ?Take 0.5 tablets (12.5 mg total) by mouth daily. ?What changed: how much to take ?  ?levothyroxine 75 MCG tablet ?Commonly known as: SYNTHROID ?TAKE 1 TABLET ONE TIME DAILY BEFORE BREAKFAST ?  ?loperamide 2 MG capsule ?Commonly known as: IMODIUM ?Take 2 capsules (4 mg total) by mouth daily. ?Start taking on: November 17, 2021 ?  ?potassium chloride 10 MEQ tablet ?Commonly known as: KLOR-CON M ?Take 1 tablet (10 mEq total) by mouth once a week. ?  ?PreserVision AREDS Tabs ?Take 1 tablet by mouth daily. ?  ? ?  ? ?  ?  ? ? ?  ?Durable Medical Equipment  ?(From admission, onward)  ?  ? ? ?  ? ?  Start     Ordered  ? 11/16/21  1027  DME Oxygen  Once       ?Question Answer Comment  ?Length of Need 6 Months   ?Mode or (Route) Nasal cannula   ?Liters per Minute 4   ?Frequency Continuous (stationary and portable oxygen unit needed)   ?Oxygen conserving device Yes   ?Oxygen delivery system Gas   ?  ? 11/16/21 1027  ? ?  ?  ? ?  ? ? Follow-up Information   ? ? Colon Branch, MD. Schedule an appointment as soon as possible for a visit in 1 week(s).   ?Specialty: Internal Medicine ?Contact information: ?[address] ?[address] ?[place] [address] ?[phone] ? ? ?  ?  ? ? Minus Breeding, MD .   ?Specialty: Cardiology ?Contact information: ?[address] ?[address] ?[place] [address] ?[phone] ? ? ?  ?  ? ?  ?  ? ?  ? ?Allergies  ?Allergen Reactions  ? Ibuprofen   ?  REACTION: bleed  ? ? ?Consultations: ?Pulmonary/GI ? ? ?Procedures/Studies: ?DG Chest 2 View ? ?Result Date: 11/07/2021 ?CLINICAL DATA:  Cough and shortness of breath. EXAM: CHEST - 2 VIEW COMPARISON:  04/29/2020 FINDINGS: 0936 hours The cardio pericardial silhouette is enlarged. Left apical soft tissue lesion again noted, better characterized on CT chest 08/13/2021. Left base collapse/consolidation is  similar to prior with probable small left effusion. Interstitial markings are diffusely coarsened with chronic features. Bones are diffusely demineralized. Telemetry leads overlie the chest. IMPRESSION: Stable exam. Left apical lesion with left base collapse/consolidation with probable small left effusion. Electronically Signed   By: Misty Stanley M.D.   On: 11/07/2021 10:07  ? ?DG Chest Port 1 View ? ?Result Date: 11/13/2021 ?CLINICAL DATA:  Dyspnea EXAM: PORTABLE CHEST 1 VIEW COMPARISON:  Chest x-ray dated November 08, 2021 FINDINGS: Cardiac and mediastinal contours are unchanged. Vertical linear opacity of the right hemithorax, likely skin fold artifact. Unchanged small bilateral pleural effusions and atelectasis. Stable nodular opacity of the left upper lobe. No  pneumothorax. IMPRESSION: Small bilateral pleural effusions and atelectasis, unchanged compared prior exam. Electronically Signed   By: Yetta Glassman M.D.   On: 11/13/2021 09:36  ? ?DG Chest Port 1V same Day ? ?Result Date:

## 2021-11-16 NOTE — Plan of Care (Signed)
?  Problem: Education: ?Goal: Knowledge of General Education information will improve ?Description: Including pain rating scale, medication(s)/side effects and non-pharmacologic comfort measures ?11/16/2021 1301 by Lennie Hummer, RN ?Outcome: Adequate for Discharge ?11/16/2021 1253 by Lennie Hummer, RN ?Outcome: Progressing ?  ?Problem: Health Behavior/Discharge Planning: ?Goal: Ability to manage health-related needs will improve ?Outcome: Adequate for Discharge ?  ?Problem: Clinical Measurements: ?Goal: Ability to maintain clinical measurements within normal limits will improve ?Outcome: Adequate for Discharge ?Goal: Will remain free from infection ?11/16/2021 1301 by Lennie Hummer, RN ?Outcome: Adequate for Discharge ?11/16/2021 1253 by Lennie Hummer, RN ?Outcome: Progressing ?Goal: Diagnostic test results will improve ?11/16/2021 1301 by Lennie Hummer, RN ?Outcome: Adequate for Discharge ?11/16/2021 1253 by Lennie Hummer, RN ?Outcome: Progressing ?Goal: Respiratory complications will improve ?11/16/2021 1301 by Lennie Hummer, RN ?Outcome: Adequate for Discharge ?11/16/2021 1253 by Lennie Hummer, RN ?Outcome: Adequate for Discharge ?Goal: Cardiovascular complication will be avoided ?Outcome: Adequate for Discharge ?  ?Problem: Activity: ?Goal: Risk for activity intolerance will decrease ?11/16/2021 1301 by Lennie Hummer, RN ?Outcome: Adequate for Discharge ?11/16/2021 1253 by Lennie Hummer, RN ?Outcome: Progressing ?  ?Problem: Elimination: ?Goal: Will not experience complications related to bowel motility ?Outcome: Adequate for Discharge ?  ?Problem: Safety: ?Goal: Ability to remain free from injury will improve ?Outcome: Adequate for Discharge ?  ?Problem: Skin Integrity: ?Goal: Risk for impaired skin integrity will decrease ?Outcome: Adequate for Discharge ?  ?Problem: Education: ?Goal: Knowledge of disease or condition will improve ?Outcome: Adequate for Discharge ?Goal: Knowledge of the prescribed  therapeutic regimen will improve ?Outcome: Adequate for Discharge ?Goal: Individualized Educational Video(s) ?Outcome: Adequate for Discharge ?  ?Problem: Activity: ?Goal: Ability to tolerate increased activity will improve ?Outcome: Adequate for Discharge ?Goal: Will verbalize the importance of balancing activity with adequate rest periods ?Outcome: Adequate for Discharge ?  ?Problem: Respiratory: ?Goal: Ability to maintain a clear airway will improve ?Outcome: Adequate for Discharge ?Goal: Levels of oxygenation will improve ?Outcome: Adequate for Discharge ?Goal: Ability to maintain adequate ventilation will improve ?Outcome: Adequate for Discharge ?  ?Problem: Activity: ?Goal: Ability to tolerate increased activity will improve ?Outcome: Adequate for Discharge ?  ?Problem: Respiratory: ?Goal: Ability to maintain adequate ventilation will improve ?Outcome: Adequate for Discharge ?Goal: Ability to maintain a clear airway will improve ?Outcome: Adequate for Discharge ?  ?

## 2021-11-17 ENCOUNTER — Telehealth: Payer: Self-pay

## 2021-11-17 NOTE — Telephone Encounter (Signed)
Pt was made aware of Dr. Carlean Purl recommendation for a follow up appointment: ?Pt was made aware that she has been scheduled for an appointment with Dr. Carlean Purl for 12/03/2021 at 8:50: ?Address Provided: ?Pt verbalized understanding with all questions answered.  ? ?

## 2021-11-17 NOTE — Telephone Encounter (Signed)
Transition Care Management Follow-up Telephone Call ?Date of discharge and from where: [facility] 11-16-21 Dx: acute hypoxic respiratory failure  ?How have you been since you were released from the hospital? Doing ok  ?Any questions or concerns? No ? ?Items Reviewed: ?Did the pt receive and understand the discharge instructions provided? Yes  ?Medications obtained and verified? Yes  ?Other? No  ?Any new allergies since your discharge? No  ?Dietary orders reviewed? Yes ?Do you have support at home? Yes  ? ?Home Care and Equipment/Supplies: ?Were home health services ordered? Yes PT/OT ?If so, what is the name of the agency? unsure  ?Has the agency set up a time to come to the patient's home? no ?Were any new equipment or medical supplies ordered?  Yes: oxygen  ?What is the name of the medical supply agency? Unsure  ?Were you able to get the supplies/equipment? yes ?Do you have any questions related to the use of the equipment or supplies? No ? ?Functional Questionnaire: (I = Independent and D = Dependent) ?ADLs: I ? ?Bathing/Dressing- I ? ?Meal Prep- I ? ?Eating- I ? ?Maintaining continence- I ? ?Transferring/Ambulation- I ? ?Managing Meds- I ? ?Follow up appointments reviewed: ? ?PCP Hospital f/u appt confirmed? Yes  Scheduled to see Dr [name]  on 11-27-21 @ 11am. ?[facility] f/u appt confirmed? No . ?Are transportation arrangements needed? No  ?If their condition worsens, is the pt aware to call PCP or go to the Emergency Dept.? Yes ?Was the patient provided with contact information for the PCP's office or ED? Yes ?Was to pt encouraged to call back with questions or concerns? Yes  ?

## 2021-11-19 ENCOUNTER — Telehealth: Payer: Self-pay

## 2021-11-19 ENCOUNTER — Telehealth: Payer: Self-pay | Admitting: Pulmonary Disease

## 2021-11-19 NOTE — Telephone Encounter (Signed)
Spoke with patient's granddaughter [name] and scheduled a Mychart Palliative Consult for 11/23/21 @ 2 PM.  ? ?Consent obtained; updated Netsmart, Team List and Epic.  ? ?

## 2021-11-20 ENCOUNTER — Other Ambulatory Visit: Payer: Self-pay | Admitting: Internal Medicine

## 2021-11-20 DIAGNOSIS — E039 Hypothyroidism, unspecified: Secondary | ICD-10-CM

## 2021-11-23 ENCOUNTER — Telehealth: Payer: Self-pay | Admitting: Internal Medicine

## 2021-11-23 DIAGNOSIS — J9601 Acute respiratory failure with hypoxia: Secondary | ICD-10-CM

## 2021-11-23 DIAGNOSIS — Z515 Encounter for palliative care: Secondary | ICD-10-CM

## 2021-11-23 DIAGNOSIS — K529 Noninfective gastroenteritis and colitis, unspecified: Secondary | ICD-10-CM

## 2021-11-23 DIAGNOSIS — R918 Other nonspecific abnormal finding of lung field: Secondary | ICD-10-CM

## 2021-11-23 DIAGNOSIS — R54 Age-related physical debility: Secondary | ICD-10-CM

## 2021-11-23 DIAGNOSIS — J441 Chronic obstructive pulmonary disease with (acute) exacerbation: Secondary | ICD-10-CM

## 2021-11-23 NOTE — Progress Notes (Addendum)
[facility] Consult Note Telephone: [phone]  Fax: [phone]   Date of encounter: 11/23/21 1:57 PM PATIENT NAME: [facility] [address] [place] [address]   [phone] (home)  DOB: 09-09-1927 MRN: [id number] PRIMARY CARE PROVIDER:    Colon Branch, MD,  [address] [facility] [address] [place] [address] [phone]  REFERRING PROVIDER:   Colon Branch, MD [address] [place],  [place] [address] [phone]  RESPONSIBLE PARTY:    Contact Information     Name Relation Home Work [facility] Granddaughter [phone]  [phone]       Due to the COVID-19 crisis, this visit was done via telemedicine from my office and it was initiated and consent by this patient and or family.  I connected with  [facility] OR PROXY on 11/23/21 by a video enabled telemedicine application and verified that I am speaking with the correct person using two identifiers.   I discussed the limitations of evaluation and management by telemedicine. The patient expressed understanding and agreed to proceed.   Palliative Care was asked to follow this patient by consultation request of  [facility] Branch, MD to address advance care planning and complex medical decision making. This is the initial visit.                                     ASSESSMENT AND PLAN / RECOMMENDATIONS:   Advance Care Planning/Goals of Care: Goals include to maximize quality of life and symptom management. Patient/health care surrogate gave his/her permission to discuss.Our advance care planning conversation included a discussion about:    The value and importance of advance care planning  Experiences with loved ones who have been seriously ill or have died  Exploration of personal, cultural or spiritual beliefs that might influence medical decisions  Exploration of goals of care in the event of a sudden injury or illness  Pt had  prior XRT for her lung mass; however, she was not eligible for a biopsy She says she would not want further treatments at this time even if offered  She'd prefer not to go back to the hospital. I did see her back in October when she was at rehab in [place] after surgery for intussusception and her wishes have clearly evolved from full code to her current palliative wishes. Her one big goal is to be and stay home.  Identification  of a healthcare agent--pt has a living will.    Review and updating or creation of an  advance directive document . Decision not to resuscitate or to de-escalate disease focused treatments due to poor prognosis. CODE STATUS:  DNR, pt also clear she would not want to be placed on a ventilator even short-term at this point; introduced MOST form and scheduled a follow-up for Wednesday afternoon to complete this.  Pt is not sure she can get out to an appt with Dr. [facility] on Thursday for a CPE due to her weakness and dyspnea.    Symptom Management/Plan: 1. COPD exacerbation ([place] -pt completed treatment for this  -remains O2 dependent--I will check her sats at her in-person visit wed and see if she does need 4L (HH had not yet done this when we spoke)  2. Acute respiratory failure with hypoxia (HCC) -since viral infection/copd exacerbation -continue 4L via  and  wean as able to keep sats over 90% -may need to use saline gel for dry nose and will confirm she has humidity on her O2 when I visit  3. Lung mass -had grown on [facility] CT but pt already quite clear she does not want more XRT so does not seem necessary to return to radiation oncology at this time or keep imaging unless she has a new symptom that cannot be explained with current pathology -goals are comfort-based as above  4. Chronic diarrhea -cont imodium therapy, encouraged frequent small snacks  5. Frailty syndrome in geriatric patient -has had some functional decline--sounds like had previously in October 2021  but she did recuperate from that and was back to the point of just needing help from her granddaughters with her bath and shampooing her hair; however, now she needs closer supervision, is moving more slowly, sleeping more, gets a little winded with walking and doing adls, likes to sit on her porch on pretty days -continue PT, OT as planned and use of walker to ambulate -pt is already dressing, fixing her own coffee, taking her pills, then naps and sleeps well overnight, too  6. Palliative care by specialist -current goals of care were reviewed with her, personal goals, purpose of palliative care, reviewed DNR status and introduced MOST form concept to be completed in-person  Follow up Palliative Care Visit: Palliative care will continue to follow for complex medical decision making, advance care planning, and clarification of goals. Return in-person 11/25/2021 for MOST and evaluating O2 sats, function primarily.   This visit was coded based on medical decision making (MDM).  23 minutes spent on ACP.  PPS: 40%  HOSPICE ELIGIBILITY/DIAGNOSIS:  Currently receiving therapy so will reassess in home next time and judge from there/left upper lobe lung ca  Chief Complaint: Initial palliative care consult  HISTORY OF PRESENT ILLNESS:  [facility] is a 88 y.o. year old female  with h/o respiratory failure, LUL lung cancer s/p XRT, no chemo and increase in size in January though no further interventions were recommended at that time and pt does not want any, prior breast cancer, PE/DVT, hypothyroidism, and chronic diarrhea among others.  Most recently, she was admitted to [facility] 4[facility] with acute respiratory failure with hypoxia after she had a cough and sob x 1 week.  She required 4L via [place] and has not yet been weaned from this.  She doesn't like it.  She was treated for a COPD exacerbation with IV steroids and antibiotics.  Rocephin was d/c'd when markers returned but she became worse.  She then had a viral  panel that showed metapneumovirus positive.  She had IV lasix diuresis, as well.  She was eventually transitioned to prednisone which she completed prior to discharge home.  She still required 4L via [place].  GI saw her and recommended GI f/u outpatient and imodium for her chronic diarrhea.    Re: her lung mass, the hospitalist team recommended she f/u with radiation oncology about this due to the growth of the mass mentioned in [facility] CT; however, pt is clear she would not want further treatment at this time--it would be too much to go through.    History obtained from review of EMR, discussion with primary team, and interview with family, facility staff/caregiver and/or [name].  I reviewed available labs, medications, imaging, studies and related documents from the EMR.  Records reviewed and summarized above.   ROS  General: NAD EYES: denies vision changes ENMT:  denies dysphagia Cardiovascular: denies chest pain, has mild DOE Pulmonary: denies cough--improved, increased SOB vs pre-admission, using oxygen now Abdomen: endorses fair appetite--does eat three meals but she is not eating "heavy" like she did years past, denies constipation, endorses diarrhea, has continence of bowel mostly GU: denies dysuria, endorses some incontinence of urine MSK:  has increased weakness,  no falls reported Skin: denies rashes or wounds Neurological: denies pain, denies insomnia Psych: Endorses positive mood Heme/lymph/immuno: easily bruises, no abnormal bleeding  Physical Exam: Current and past weights:  hospital admission weight was 126 lbs--no discharge weight in flow sheet Constitutional: NAD General: frail appearing, thin EYES: anicteric sclera, lids intact, no discharge  ENMT: intact hearing, oral mucous membranes moist, dentition intact CV: no LE edema Pulmonary: mild increased work of breathing with prolonged speech or moving about, no cough, using 4L via [place] Abdomen: intake 75%, no ascites GU:  deferred MSK: sarcopenia, moves all extremities, ambulatory with walker Skin: warm and dry, no rashes or wounds on visible skin Neuro:  has generalized weakness,  no cognitive impairment Psych: non-anxious affect, A and O x 3 Hem/lymph/immuno: no widespread bruising  CURRENT PROBLEM LIST:  Patient Active Problem List   Diagnosis Date Noted   Hyponatremia 11/10/2021   Lung mass 11/08/2021   Acute hypoxemic respiratory failure ([place]) 11/08/2021   Acute respiratory failure with hypoxia (HCC) 11/07/2021   Cough 11/07/2021   Chronic diarrhea 11/07/2021   Dehydration 11/07/2021   Solitary pulmonary nodule 07/09/2020   Abnormal findings on diagnostic imaging of lung 05/30/2020   Acute deep vein thrombosis (DVT) of proximal vein of both lower extremities ([place]) 05/30/2020   VTE (venous thromboembolism) 05/30/2020   Goals of care, counseling/discussion    Palliative care by specialist    DNR (do not resuscitate) discussion    Intussusception ([place]) 04/29/2020   Pulmonary embolism, bilateral ([place]) 04/29/2020   Abnormal CT scan of lung 04/29/2020   Hypoxia 04/29/2020   Acute pulmonary embolism ([place]) 04/29/2020   Pre-ulcerative calluses 01/25/2020   Essential hypertension 11/29/2016   Closed fracture of multiple pubic rami ([place]) 11/29/2016   Fall    Closed fracture of left pubis ([place]) 11/28/2016   Hypokalemia 11/28/2016   Hearing loss 09/19/2015   PCP NOTES >>> 05/13/2015   Allergic rhinitis 10/24/2014   Bronchitis 10/24/2014   Annual physical exam 04/14/2011   BACK PAIN, shoulder pain 12/11/2007   Osteoporosis 10/20/2007   NEOPLASM, MALIGNANT, BREAST, HX OF 10/20/2007   Hypothyroidism 01/30/2007   Hyperlipidemia 01/30/2007   HTN and mild edema 01/30/2007   OSTEOARTHRITIS 01/30/2007   PAST MEDICAL HISTORY:  Active Ambulatory Problems    Diagnosis Date Noted   Hypothyroidism 01/30/2007   Hyperlipidemia 01/30/2007   HTN and mild edema 01/30/2007   OSTEOARTHRITIS 01/30/2007    BACK PAIN, shoulder pain 12/11/2007   Osteoporosis 10/20/2007   NEOPLASM, MALIGNANT, BREAST, HX OF 10/20/2007   Annual physical exam 04/14/2011   Allergic rhinitis 10/24/2014   Bronchitis 10/24/2014   PCP NOTES >>> 05/13/2015   Closed fracture of left pubis ([place]) 11/28/2016   Hypokalemia 11/28/2016   Fall    Essential hypertension 11/29/2016   Closed fracture of multiple pubic rami ([place]) 11/29/2016   Hearing loss 09/19/2015   Pre-ulcerative calluses 01/25/2020   Intussusception ([place]) 04/29/2020   Pulmonary embolism, bilateral ([place]) 04/29/2020   Abnormal CT scan of lung 04/29/2020   Hypoxia 04/29/2020   Acute pulmonary embolism ([place]) 04/29/2020   Goals of care, counseling/discussion    Palliative care by  specialist    DNR (do not resuscitate) discussion    Abnormal findings on diagnostic imaging of lung 05/30/2020   Acute deep vein thrombosis (DVT) of proximal vein of both lower extremities ([place]) 05/30/2020   VTE (venous thromboembolism) 05/30/2020   Solitary pulmonary nodule 07/09/2020   Acute respiratory failure with hypoxia ([place]) 11/07/2021   Cough 11/07/2021   Chronic diarrhea 11/07/2021   Dehydration 11/07/2021   Lung mass 11/08/2021   Acute hypoxemic respiratory failure ([place]) 11/08/2021   Hyponatremia 11/10/2021   Resolved Ambulatory Problems    Diagnosis Date Noted   Superficial phlebitis 10/05/2013   Acute pharyngitis 10/24/2014   GI bleed 04/29/2020   Past Medical History:  Diagnosis Date   Anemia    Breast CA ([place])    Diverticulitis of Ari[place]    Hiatal hernia    History of radiation therapy 08/05/2020   Hypertension    Intussusception of intestine ([place]) 2021   Osteoarthritis    Osteopenia    Pulmonary embolism (HCC)/DVT    Syncope 2005   SOCIAL HX:  Social History   Tobacco Use   Smoking status: Never   Smokeless tobacco: Never  Substance Use Topics   Alcohol use: No   FAMILY HX:  Family History  Problem Relation Age of Onset   Heart attack  Brother 40   Diabetes Mother    Stroke Father 77   Stroke Sister 15   [facility] cancer Neg Hx    Breast cancer Neg Hx       ALLERGIES:  Allergies  Allergen Reactions   Ibuprofen     REACTION: bleed     PERTINENT MEDICATIONS:  Outpatient Encounter Medications as of 11/23/2021  Medication Sig   levothyroxine (SYNTHROID) 75 MCG tablet Take 1 tablet (75 mcg total) by mouth daily before breakfast.   albuterol (VENTOLIN HFA) 108 (90 Base) MCG/ACT inhaler Inhale 2 puffs into the lungs every 6 (six) hours as needed for wheezing or shortness of breath.   amLODipine (NORVASC) 5 MG tablet Take 1 tablet (5 mg total) by mouth daily.   benzonatate (TESSALON) 100 MG capsule Take 1 capsule (100 mg total) by mouth 3 (three) times daily as needed for cough.   budesonide-formoterol (SYMBICORT) 160-4.5 MCG/ACT inhaler Inhale 2 puffs into the lungs in the morning and at bedtime.   ELIQUIS 5 MG TABS tablet TAKE 1 TABLET TWICE DAILY   fluticasone (FLONASE) 50 MCG/ACT nasal spray Place 2 sprays into both nostrils daily as needed for allergies or rhinitis. (Patient not taking: Reported on 11/07/2021)   guaiFENesin (MUCINEX) 600 MG 12 hr tablet Take 2 tablets (1,200 mg total) by mouth 2 (two) times daily.   hydrochlorothiazide (HYDRODIURIL) 25 MG tablet Take 0.5 tablets (12.5 mg total) by mouth daily.   loperamide (IMODIUM) 2 MG capsule Take 2 capsules (4 mg total) by mouth daily.   Multiple Vitamins-Minerals (PRESERVISION AREDS) TABS Take 1 tablet by mouth daily.   potassium chloride (KLOR-CON M) 10 MEQ tablet Take 1 tablet (10 mEq total) by mouth once a week.   No facility-administered encounter medications on file as of 11/23/2021.   Thank you for the opportunity to participate in the care of [name].  The palliative care team will continue to follow. Please call our office at [phone] if we can be of additional assistance.   Hollace Kinnier, DO   COVID-19 PATIENT SCREENING TOOL Asked and negative response  unless otherwise noted:  Have you had symptoms of covid, tested positive or been  in contact with someone with symptoms/positive test in the past 5-10 days? no

## 2021-11-25 ENCOUNTER — Other Ambulatory Visit: Payer: Self-pay | Admitting: Internal Medicine

## 2021-11-25 ENCOUNTER — Encounter: Payer: Self-pay | Admitting: Internal Medicine

## 2021-11-25 VITALS — BP 130/70 | HR 91 | Temp 97.7°F | Resp 18

## 2021-11-25 DIAGNOSIS — K529 Noninfective gastroenteritis and colitis, unspecified: Secondary | ICD-10-CM

## 2021-11-25 DIAGNOSIS — J9611 Chronic respiratory failure with hypoxia: Secondary | ICD-10-CM

## 2021-11-25 DIAGNOSIS — R54 Age-related physical debility: Secondary | ICD-10-CM

## 2021-11-25 DIAGNOSIS — Z515 Encounter for palliative care: Secondary | ICD-10-CM

## 2021-11-25 DIAGNOSIS — R918 Other nonspecific abnormal finding of lung field: Secondary | ICD-10-CM

## 2021-11-25 NOTE — Progress Notes (Signed)
[facility] Pall[facility] Care Follow-Up Visit Telephone: [phone]  Fax: [phone]   Date of encounter: 11/25/21 1:52 PM PATIENT NAME: Ariel Medina [address] [place] [address]   [phone] (home)  DOB: 1927/11/09 MRN: [id number] PRIMARY CARE PROVIDER:    Colon Branch, MD,  [address] [place] [address] [phone]  REFERRING PROVIDER:   Colon Branch, MD [address] [place],  [place] [address] [phone]  RESPONSIBLE PARTY:    Contact Information     Name Relation Home [address] Granddaughter [phone]  [phone]        I met face to face with patient and family in her home. Palliative Care was asked to follow this patient by consultation request of  Colon Branch, MD to address advance care planning and complex medical decision making. This is follow-up visit.                                     ASSESSMENT AND PLAN / RECOMMENDATIONS:   Advance Care Planning/Goals of Care: Goals include to maximize quality of life and symptom management. Patient/health care surrogate gave his/her permission to discuss.Our advance care planning conversation included a discussion about:    The value and importance of advance care planning  Experiences with loved ones who have been seriously ill or have died  Exploration of personal, cultural or spiritual beliefs that might influence medical decisions  Exploration of goals of care in the event of a sudden injury or illness  Identification  of a healthcare agent--granddaughter, [name] Review and updating or creation of an  advance directive document . Decision not to resuscitate or to de-escalate disease focused treatments due to poor prognosis. CODE STATUS:  DNR:  MOST completed today:  DNR, comfort measures, abx case by case, no IVF and no tube feeding  Symptom Management/Plan: 1. Chronic respiratory failure with hypoxia  (HCC) -continue oxygen, but pt was able to tolerate 2L instead of 4L O2 so now reportedly could get a smaller concentrator device and also needs humidity added b/c for some reason this was not provided with initial device--message sent to team to try to get this from O2 supplier (?advanced as grandson reports on [address]) -sats drop to 76 with exertion w/o O2 so not able to d/c oxygen--pt is hoping she will not need long-term -does seem to be nearly recovered from her copd exacerbation otherwise -recommended getting pulse oximeter for home use to monitor O2 needs  2. Lung mass -s/p XRT previously but grew on imaging, pt requests no further interventions at this time  3. Frailty syndrome in geriatric patient -not able to get around like she could but still independent in adls except bathing, doesn't cook as much, but walks around with rollator and oxygen  -getting PT which she's not thrilled about doing but says the hospital insisted  4. Chronic diarrhea -recommended every other day imodium as daily results in constipation and going 2 days w/o it meant she had a fecal incontinence episode this am   5. Palliative care by specialist -MOST completed and uploaded to vynca today with comfort-based goals per pt request -will f/u in a week or two and see how she is doing with O2 needs   Follow up Palliative Care Visit: Palliative care will continue to follow for complex medical  decision making, advance care planning, and clarification of goals. Return 1-2 weeks or prn.  This visit was coded based on medical decision making (MDM). 30 minutes spent on ACP  PPS: 50%  HOSPICE ELIGIBILITY/DIAGNOSIS: TBD/lung cancer  Chief Complaint: Follow-up palliative visit  HISTORY OF PRESENT ILLNESS:  Ariel Medina is a 88 y.o. year old female  with lung cancer, COPD, recent exacerbation from metapneumovirus, chronic diarrhea after bowel resection, and generalized frailty at advanced age seen for  in-person palliative f/u.  She reports she is doing gradually better.  She has a dry nose and wishes she could get off oxygen (no humidity provided--not sure why this ever happens).  Getting HH PT.  Using rollator.  Eating pretty well.  Sleeps well, napping more than usual.  Very talkative and pleasant telling me all about growing up on a farm [place] the road in [place] and what that was like, talking about her family members and next door neighbor who is related and just lost her husband.    Checked out oxygen situation:  98-99 on 4L upon arrival and at rest.  Then dropped to 88 at rest w/o O2 and 76 w/o O2 on exertion.  Put her back on 2L and sats 93% and she felt like it wasn't blowing so badly in her nose.    History obtained from review of EMR, discussion with primary team, and interview with family, facility staff/caregiver and/or Ariel Medina.  I reviewed available labs, medications, imaging, studies and related documents from the EMR.  Records reviewed and summarized above.   ROS  General: NAD EYES: denies vision changes ENMT: denies dysphagia Cardiovascular: denies chest pain, has DOE with prolonged talking and physical activity--walking, reaching Pulmonary: denies cough, denies increased SOB--better than it was Abdomen: endorses good appetite, denies constipation, endorses occasional incontinence of bowel GU: denies dysuria, endorses some incontinence of urine MSK: some increased weakness after hospital,  no falls reported Skin: denies rashes or wounds Neurological: denies pain, denies insomnia Psych: Endorses positive mood Heme/lymph/immuno: denies bruises, abnormal bleeding  Physical Exam: Current and past weights: no new today Constitutional: NAD General: frail appearing, thin, cold and dressed in 4 layers plus vest EYES: anicteric sclera, lids intact, no discharge  ENMT: intact hearing, oral mucous membranes moist, dentition intact CV: S1S2, RRR, nonpitting soft edema,  hyperpigmentation of lower legs, feet Pulmonary: LCTA,increased work of breathing only with exertion, no cough, down to 2L after visit today Abdomen: intake 75%, normo-active BS + 4 quadrants, soft and non tender, no ascites GU: deferred MSK:  sarcopenia, moves all extremities, ambulatory with rollator Skin: warm and dry, no rashes or wounds on visible skin Neuro:  no generalized weakness,  no cognitive impairment Psych: non-anxious affect, A and O x 3 Hem/lymph/immuno: no widespread bruising  CURRENT PROBLEM LIST:  Patient Active Problem List   Diagnosis Date Noted   Hyponatremia 11/10/2021   Lung mass 11/08/2021   Acute hypoxemic respiratory failure (HCC) 11/08/2021   Acute respiratory failure with hypoxia (HCC) 11/07/2021   Cough 11/07/2021   Chronic diarrhea 11/07/2021   Dehydration 11/07/2021   Solitary pulmonary nodule 07/09/2020   Abnormal findings on diagnostic imaging of lung 05/30/2020   Acute deep vein thrombosis (DVT) of proximal vein of both lower extremities ([place] [place]) 05/30/2020   VTE (venous thromboembolism) 05/30/2020   Goals of care, counseling/discussion    Palliative care by specialist    DNR (do not resuscitate) discussion    Intussusception ([facility]) 04/29/2020   Pulmonary embolism, bilateral (  [facility]) 04/29/2020   Abnormal CT scan of lung 04/29/2020   Hypoxia 04/29/2020   Acute pulmonary embolism ([facility]) 04/29/2020   Pre-ulcerative calluses 01/25/2020   Essential hypertension 11/29/2016   Closed fracture of multiple pubic rami ([facility]) 11/29/2016   Fall    Closed fracture of left pubis ([facility]) 11/28/2016   Hypokalemia 11/28/2016   Hearing loss 09/19/2015   PCP NOTES >>> 05/13/2015   Allergic rhinitis 10/24/2014   Bronchitis 10/24/2014   Annual physical exam 04/14/2011   BACK PAIN, shoulder pain 12/11/2007   Osteoporosis 10/20/2007   NEOPLASM, MALIGNANT, BREAST, HX OF 10/20/2007   Hypothyroidism 01/30/2007   Hyperlipidemia 01/30/2007   HTN and mild edema 01/30/2007    OSTEOARTHRITIS 01/30/2007   PAST MEDICAL HISTORY:  Active Ambulatory Problems    Diagnosis Date Noted   Hypothyroidism 01/30/2007   Hyperlipidemia 01/30/2007   HTN and mild edema 01/30/2007   OSTEOARTHRITIS 01/30/2007   BACK PAIN, shoulder pain 12/11/2007   Osteoporosis 10/20/2007   NEOPLASM, MALIGNANT, BREAST, HX OF 10/20/2007   Annual physical exam 04/14/2011   Allergic rhinitis 10/24/2014   Bronchitis 10/24/2014   PCP NOTES >>> 05/13/2015   Closed fracture of left pubis ([facility]) 11/28/2016   Hypokalemia 11/28/2016   Fall    Essential hypertension 11/29/2016   Closed fracture of multiple pubic rami ([facility]) 11/29/2016   Hearing loss 09/19/2015   Pre-ulcerative calluses 01/25/2020   Intussusception ([facility]) 04/29/2020   Pulmonary embolism, bilateral ([facility] [facility]) 04/29/2020   Abnormal CT scan of lung 04/29/2020   Hypoxia 04/29/2020   Acute pulmonary embolism ([facility]) 04/29/2020   Goals of care, counseling/discussion    Palliative care by specialist    DNR (do not resuscitate) discussion    Abnormal findings on diagnostic imaging of lung 05/30/2020   Acute deep vein thrombosis (DVT) of proximal vein of both lower extremities ([facility]) 05/30/2020   VTE (venous thromboembolism) 05/30/2020   Solitary pulmonary nodule 07/09/2020   Acute respiratory failure with hypoxia ([facility]) 11/07/2021   Cough 11/07/2021   Chronic diarrhea 11/07/2021   Dehydration 11/07/2021   Lung mass 11/08/2021   Acute hypoxemic respiratory failure ([facility]) 11/08/2021   Hyponatremia 11/10/2021   Resolved Ambulatory Problems    Diagnosis Date Noted   Superficial phlebitis 10/05/2013   Acute pharyngitis 10/24/2014   GI bleed 04/29/2020   Past Medical History:  Diagnosis Date   Anemia    Breast CA ([facility])    Diverticulitis of colon    Hiatal hernia    History of radiation therapy 08/05/2020   Hypertension    Intussusception of intestine ([facility]) 2021   Osteoarthritis    Osteopenia    Pulmonary embolism (HCC)/DVT     Syncope 2005   SOCIAL HX:  Social History   Tobacco Use   Smoking status: Never   Smokeless tobacco: Never  Substance Use Topics   Alcohol use: No     ALLERGIES:  Allergies  Allergen Reactions   Ibuprofen     REACTION: bleed     PERTINENT MEDICATIONS:  Outpatient Encounter Medications as of 11/25/2021  Medication Sig   levothyroxine (SYNTHROID) 75 MCG tablet Take 1 tablet (75 mcg total) by mouth daily before breakfast.   albuterol (VENTOLIN HFA) 108 (90 Base) MCG/ACT inhaler Inhale 2 puffs into the lungs every 6 (six) hours as needed for wheezing or shortness of breath.   amLODipine (NORVASC) 5 MG tablet Take 1 tablet (5 mg total) by mouth daily.   benzonatate (TESSALON) 100 MG capsule Take 1 capsule (100 mg  total) by mouth 3 (three) times daily as needed for cough.   budesonide-formoterol (SYMBICORT) 160-4.5 MCG/ACT inhaler Inhale 2 puffs into the lungs in the morning and at bedtime.   ELIQUIS 5 MG TABS tablet TAKE 1 TABLET TWICE DAILY   fluticasone (FLONASE) 50 MCG/ACT nasal spray Place 2 sprays into both nostrils daily as needed for allergies or rhinitis. (Patient not taking: Reported on 11/07/2021)   guaiFENesin (MUCINEX) 600 MG 12 hr tablet Take 2 tablets (1,200 mg total) by mouth 2 (two) times daily.   hydrochlorothiazide (HYDRODIURIL) 25 MG tablet Take 0.5 tablets (12.5 mg total) by mouth daily.   loperamide (IMODIUM) 2 MG capsule Take 2 capsules (4 mg total) by mouth daily.   Multiple Vitamins-Minerals (PRESERVISION AREDS) TABS Take 1 tablet by mouth daily.   potassium chloride (KLOR-CON M) 10 MEQ tablet Take 1 tablet (10 mEq total) by mouth once a week.   No facility-administered encounter medications on file as of 11/25/2021.   Thank you for the opportunity to participate in the care of Ariel Medina.  The palliative care team will continue to follow. Please call our office at [phone] if we can be of additional assistance.   Hollace Kinnier, DO  COVID-19 PATIENT SCREENING  TOOL Asked and negative response unless otherwise noted:  Have you had symptoms of covid, tested positive or been in contact with someone with symptoms/positive test in the past 5-10 days? no

## 2021-11-26 ENCOUNTER — Encounter: Payer: Self-pay | Admitting: Internal Medicine

## 2021-11-27 ENCOUNTER — Encounter: Payer: Self-pay | Admitting: *Deleted

## 2021-11-27 ENCOUNTER — Ambulatory Visit (INDEPENDENT_AMBULATORY_CARE_PROVIDER_SITE_OTHER): Payer: Medicare HMO | Admitting: Internal Medicine

## 2021-11-27 ENCOUNTER — Encounter: Payer: Self-pay | Admitting: Internal Medicine

## 2021-11-27 VITALS — BP 112/78 | HR 78 | Temp 98.0°F | Ht 63.0 in

## 2021-11-27 DIAGNOSIS — E871 Hypo-osmolality and hyponatremia: Secondary | ICD-10-CM

## 2021-11-27 DIAGNOSIS — I10 Essential (primary) hypertension: Secondary | ICD-10-CM | POA: Diagnosis not present

## 2021-11-27 DIAGNOSIS — J9601 Acute respiratory failure with hypoxia: Secondary | ICD-10-CM

## 2021-11-27 DIAGNOSIS — E039 Hypothyroidism, unspecified: Secondary | ICD-10-CM

## 2021-11-27 DIAGNOSIS — R918 Other nonspecific abnormal finding of lung field: Secondary | ICD-10-CM | POA: Diagnosis not present

## 2021-11-27 LAB — CBC WITH DIFFERENTIAL/PLATELET
Basophils Absolute: 0 10*3/uL (ref 0.0–0.1)
Basophils Relative: 0.7 % (ref 0.0–3.0)
Eosinophils Absolute: 0.1 10*3/uL (ref 0.0–0.7)
Eosinophils Relative: 2.3 % (ref 0.0–5.0)
HCT: 39 % (ref 36.0–46.0)
Hemoglobin: 12.9 g/dL (ref 12.0–15.0)
Lymphocytes Relative: 18.2 % (ref 12.0–46.0)
Lymphs Abs: 0.9 10*3/uL (ref 0.7–4.0)
MCHC: 33.1 g/dL (ref 30.0–36.0)
MCV: 94.6 fl (ref 78.0–100.0)
Monocytes Absolute: 0.5 10*3/uL (ref 0.1–1.0)
Monocytes Relative: 10.1 % (ref 3.0–12.0)
Neutro Abs: 3.5 10*3/uL (ref 1.4–7.7)
Neutrophils Relative %: 68.7 % (ref 43.0–77.0)
Platelets: 243 10*3/uL (ref 150.0–400.0)
RBC: 4.13 Mil/uL (ref 3.87–5.11)
RDW: 14.3 % (ref 11.5–15.5)
WBC: 5.1 10*3/uL (ref 4.0–10.5)

## 2021-11-27 LAB — COMPREHENSIVE METABOLIC PANEL
ALT: 6 U/L (ref 0–35)
AST: 15 U/L (ref 0–37)
Albumin: 3.4 g/dL — ABNORMAL LOW (ref 3.5–5.2)
Alkaline Phosphatase: 52 U/L (ref 39–117)
BUN: 10 mg/dL (ref 6–23)
CO2: 35 mEq/L — ABNORMAL HIGH (ref 19–32)
Calcium: 9.2 mg/dL (ref 8.4–10.5)
Chloride: 95 mEq/L — ABNORMAL LOW (ref 96–112)
Creatinine, Ser: 0.55 mg/dL (ref 0.40–1.20)
GFR: 80.01 mL/min (ref >60.00)
Glucose, Bld: 84 mg/dL (ref 70–99)
Potassium: 3.9 mEq/L (ref 3.5–5.1)
Sodium: 138 mEq/L (ref 135–145)
Total Bilirubin: 0.6 mg/dL (ref 0.2–1.2)
Total Protein: 6 g/dL (ref 6.0–8.3)

## 2021-11-27 LAB — T4, FREE: Free T4: 1.16 ng/dL (ref 0.60–1.60)

## 2021-11-27 LAB — TSH: TSH: 3.22 u[IU]/mL (ref 0.35–5.50)

## 2021-11-27 MED ORDER — LOPERAMIDE HCL 2 MG PO CAPS: 2.0000 mg | ORAL_CAPSULE | Freq: Every day | ORAL | Status: AC | PRN

## 2021-11-27 NOTE — Patient Instructions (Signed)
Check your oxygen  ? ?GO TO THE LAB : Get the blood work   ? ? ?[place], [place] ?Come back for a check up in 3 months  ?

## 2021-11-27 NOTE — Progress Notes (Signed)
? ?Subjective:  ? ? Patient ID: Ariel Medina, female    DOB: Sep 18, 1927, 88 y.o.   MRN: [id number] ? ?DOS:  11/27/2021 ?Type of visit - description: TCM 23, here with her granddaughter ? ?Admitted to the hospital discharge November 16, 2021 ?Problems during the hospital: ?Acute hypoxic respiratory failure due to COPD exacerbation. ?Had IV Solu-Medrol, oral steroids, broad-spectrum antibiotics. ?Antibiotics were subsequently discontinued because a negative procalcitonin. ?Pulmonary consulted. ?Some diuresis was added. ?At the time of discharge she was stable on oxygen 4 L per nasal cannula ? ?Hyponatremia: Possibly from HCTZ, needs follow-up ? ?Review of Systems ?Since she left the hospital and be sent home, family check on her frequently. ?Appetite is okay. ?Emotionally doing okay as well. ?No fever chills ?Lower extremity edema at baseline ?Diarrhea: Controlled on Imodium, sometimes has constipation ?Denies cough or hemoptysis ?Shortness of breath at rest with oxygen: Controlled. ? ?Past Medical History:  ?Diagnosis Date  ? Anemia   ? Breast CA ([facility])   ? left breast-invasive ductal ca stage 1-stopped arimidex 2008  ? Diverticulitis of colon   ? GI bleed 04/2020  ? Hiatal hernia   ? History of radiation therapy 08/05/2020  ? 54 Gray in 3 fractions to the left upper lung nodule 07/29/2020-08/05/2020.   Dr Gery Pray  ? Hyperlipidemia   ? Hypertension   ? Hypothyroidism   ? Intussusception of intestine ([facility]) 2021  ? Lung mass   ? Osteoarthritis   ? Osteopenia   ? DEXA 7/02  ? Pulmonary embolism (HCC)/DVT   ? Syncope 2005  ? CT head (-), ECHO essent. neg, stress test (-), carotid u/s (-)  ? ? ?Past Surgical History:  ?Procedure Laterality Date  ? BREAST BIOPSY Bilateral   ? BREAST LUMPECTOMY  04/2002  ? left  ? PARTIAL COLECTOMY N/A 05/01/2020  ? Procedure: PARTIAL COLECTOMY;  Surgeon: Coralie Keens, MD;  Location: [facility];  Service: General;  Laterality: N/A;  ? TONSILLECTOMY AND ADENOIDECTOMY    ? ? ?Current  Outpatient Medications  ?Medication Instructions  ? albuterol (VENTOLIN HFA) 108 (90 Base) MCG/ACT inhaler 2 puffs, Inhalation, Every 6 hours PRN  ? amLODipine (NORVASC) 5 mg, Oral, Daily  ? benzonatate (TESSALON) 100 mg, Oral, 3 times daily PRN  ? budesonide-formoterol (SYMBICORT) 160-4.5 MCG/ACT inhaler 2 puffs, Inhalation, 2 times daily  ? ELIQUIS 5 MG TABS tablet TAKE 1 TABLET TWICE DAILY  ? fluticasone (FLONASE) 50 MCG/ACT nasal spray 2 sprays, Each Nare, Daily PRN  ? guaiFENesin (MUCINEX) 1,200 mg, Oral, 2 times daily  ? hydrochlorothiazide (HYDRODIURIL) 12.5 mg, Oral, Daily  ? levothyroxine (SYNTHROID) 75 mcg, Oral, Daily before breakfast  ? loperamide (IMODIUM) 2 mg, Oral, Daily PRN  ? Multiple Vitamins-Minerals (PRESERVISION AREDS) TABS 1 tablet, Oral, Daily  ? potassium chloride (KLOR-CON M) 10 MEQ tablet 10 mEq, Oral, Weekly  ? ? ?   ?Objective:  ? Physical Exam ?BP 112/78 (BP Location: Right Arm, Patient Position: Sitting, Cuff Size: Small)   Pulse 78   Temp 98 ?F (36.7 ?C) (Oral)   Ht 5\' 3"  (1.6 m)   SpO2 98% Comment: 4L/min  BMI 22.32 kg/m?  ?General:   ?Well developed, NAD, BMI noted.  Sits in a wheelchair, seems comfortable on oxygen. ?HEENT:  ?Normocephalic . Face symmetric, atraumatic ?Lungs:  ?decreased breath sounds, slightly increased expiratory time ?Normal respiratory effort, no intercostal retractions, no accessory muscle use. ?Heart: RRR,  no murmur.  ?Lower extremities: Trace pretibial edema bilaterally  ?Skin: Not pale. Not  jaundice ?Neurologic:  ?alert & oriented X3.  ?Speech normal, gait not tested ?Psych--  ?Cognition and judgment appear intact.  ?Cooperative with normal attention span and concentration.  ?Behavior appropriate. ?No anxious or depressed appearing.  ? ?   ?Assessment   ? ? Assessment  ?HTN ?Hyperlipidemia ?Hypothyroidism ?Osteopenia:  ?--T score -2.1 (04-2012) DC Fosamax 12/2013 after 5 years ?--T score -1.5   (02-2015)  ?--T score -3.6 (april 2019) on Prolia ?DJD ?GERD   ?Tremors, likely essential ?H/o diverticulitis ?Breast cancer, L breast, release from oncology 2013 ?Syncope 2005 ?11-2016: Fall, FX L pubic ramus, admitted then d/c to a NH, back home 12-18-16 ?Edema, R>L U/S (-)  for DVT 01/2017 ? ?PLAN: ?Hypoxic respiratory failure, acute on chronic due to COPD exacerbation,  Hyponatremia: ?Admitted with above, treated with antibiotics, steroids, now on oxygen permanently, 4 L. ?Overall she feels much better, denies cough or hemoptysis.  On Symbicort, albuterol. ?will check a CMP and CBC. ?Diarrhea: ?Chronic issue, better with Imodium 2 tablets daily, she got constipated 1 time and skipped Imodium. ?Plan: Decrease Imodium to once daily prn; plans to  see GI. ?HTN: On amlodipine, HCTZ (dose decreased @ last admission to 1/2 tablet), KCl. ?She has plenty of KCl at home, instructions said "take once a week".  She took 1 this morning.  Check BMP, further advised with results, most likely will need KCl more frequently. ?Hypothyroidism: Check TSH ?Lung mass:  ?The patient has a history of primary L upper lobe malignancy, no biopsy, had empiric radiation therapy.  No further intervention per radiation oncology, was referred to palliative care. ?Palliative care: Patient feels that they are doing a great job, MOST form completed by palliative care physician. ?Social: Lives at home, taking care of by her grandson and granddaughter. ?RTC 3 months ? ?  ? ?This visit occurred during the SARS-CoV-2 public health emergency.  Safety protocols were in place, including screening questions prior to the visit, additional usage of staff PPE, and extensive cleaning of exam room while observing appropriate contact time as indicated for disinfecting solutions.  ? ?

## 2021-11-27 NOTE — Progress Notes (Signed)
Faxed order for humidifier bottle attachment for patient's oxygen concentrator as requested by Dr. Hollace Kinnier to [facility] at [phone]. Confirmation fax received. ?

## 2021-11-29 NOTE — Assessment & Plan Note (Signed)
Hypoxic respiratory failure, acute on chronic due to COPD exacerbation,  Hyponatremia: ?Admitted with above, treated with antibiotics, steroids, now on oxygen permanently, 4 L. ?Overall she feels much better, denies cough or hemoptysis.  On Symbicort, albuterol. ?will check a CMP and CBC. ?Diarrhea: ?Chronic issue, better with Imodium 2 tablets daily, she got constipated 1 time and skipped Imodium. ?Plan: Decrease Imodium to once daily prn; plans to  see GI. ?HTN: On amlodipine, HCTZ (dose decreased @ last admission to 1/2 tablet), KCl. ?She has plenty of KCl at home, instructions said "take once a week".  She took 1 this morning.  Check BMP, further advised with results, most likely will need KCl more frequently. ?Hypothyroidism: Check TSH ?Lung mass:  ?The patient has a history of primary L upper lobe malignancy, no biopsy, had empiric radiation therapy.  No further intervention per radiation oncology, was referred to palliative care. ?Palliative care: Patient feels that they are doing a great job, MOST form completed by palliative care physician. ?Social: Lives at home, taking care of by her grandson and granddaughter. ?RTC 3 months ?

## 2021-12-02 ENCOUNTER — Telehealth: Payer: Self-pay

## 2021-12-02 DIAGNOSIS — Z853 Personal history of malignant neoplasm of breast: Secondary | ICD-10-CM

## 2021-12-02 DIAGNOSIS — I10 Essential (primary) hypertension: Secondary | ICD-10-CM | POA: Diagnosis not present

## 2021-12-02 DIAGNOSIS — Z7901 Long term (current) use of anticoagulants: Secondary | ICD-10-CM | POA: Diagnosis not present

## 2021-12-02 DIAGNOSIS — E039 Hypothyroidism, unspecified: Secondary | ICD-10-CM | POA: Diagnosis not present

## 2021-12-02 DIAGNOSIS — Z9181 History of falling: Secondary | ICD-10-CM

## 2021-12-02 DIAGNOSIS — J449 Chronic obstructive pulmonary disease, unspecified: Secondary | ICD-10-CM | POA: Diagnosis not present

## 2021-12-02 DIAGNOSIS — E785 Hyperlipidemia, unspecified: Secondary | ICD-10-CM | POA: Diagnosis not present

## 2021-12-02 DIAGNOSIS — Z7951 Long term (current) use of inhaled steroids: Secondary | ICD-10-CM | POA: Diagnosis not present

## 2021-12-02 DIAGNOSIS — Z86718 Personal history of other venous thrombosis and embolism: Secondary | ICD-10-CM

## 2021-12-02 DIAGNOSIS — M1990 Unspecified osteoarthritis, unspecified site: Secondary | ICD-10-CM | POA: Diagnosis not present

## 2021-12-02 DIAGNOSIS — Z9981 Dependence on supplemental oxygen: Secondary | ICD-10-CM | POA: Diagnosis not present

## 2021-12-02 DIAGNOSIS — Z9089 Acquired absence of other organs: Secondary | ICD-10-CM

## 2021-12-02 DIAGNOSIS — D649 Anemia, unspecified: Secondary | ICD-10-CM | POA: Diagnosis not present

## 2021-12-02 NOTE — Telephone Encounter (Signed)
Plan of care signed and faxed to [facility] at [phone]. Form sent for scanning  ?

## 2021-12-03 ENCOUNTER — Encounter: Payer: Self-pay | Admitting: Internal Medicine

## 2021-12-03 ENCOUNTER — Ambulatory Visit: Payer: Medicare HMO | Admitting: Internal Medicine

## 2021-12-03 ENCOUNTER — Other Ambulatory Visit: Payer: Self-pay | Admitting: Internal Medicine

## 2021-12-03 VITALS — BP 116/68 | HR 67

## 2021-12-03 DIAGNOSIS — R54 Age-related physical debility: Secondary | ICD-10-CM

## 2021-12-03 DIAGNOSIS — K529 Noninfective gastroenteritis and colitis, unspecified: Secondary | ICD-10-CM

## 2021-12-03 DIAGNOSIS — J9611 Chronic respiratory failure with hypoxia: Secondary | ICD-10-CM

## 2021-12-03 NOTE — Progress Notes (Signed)
[facility] Pall[facility] Care Follow-Up Visit Telephone: [phone]  Fax: [phone]   Date of encounter: 12/03/21 11:30 AM PATIENT NAME: Ariel Medina [address] [place] [address]   [phone] (home)  DOB: 21-Nov-1927 MRN: [id number] PRIMARY CARE PROVIDER:    Colon Branch, MD,  [address] [place] [address] [phone]  REFERRING PROVIDER:   Colon Branch, MD [address] [place],  [place] [address] [phone]  RESPONSIBLE PARTY:    Contact Information     Name Relation Home Work [place] Granddaughter [phone]  [phone]        I met face to face with patient and family in her home. Palliative Care was asked to follow this patient by consultation request of  Colon Branch, MD to address advance care planning and complex medical decision making. This is follow-up visit.                                     ASSESSMENT AND PLAN / RECOMMENDATIONS:   Advance Care Planning/Goals of Care: Goals include to maximize quality of life and symptom management. Patient/health care surrogate gave his/her permission to discuss.Our advance care planning conversation included a discussion about:    The value and importance of advance care planning  Experiences with loved ones who have been seriously ill or have died  Exploration of personal, cultural or spiritual beliefs that might influence medical decisions  Exploration of goals of care in the event of a sudden injury or illness  Identification  of a healthcare agent  Review and updating or creation of an  advance directive document . Decision not to resuscitate or to de-escalate disease focused treatments due to poor prognosis. CODE STATUS:  DNR, MOST completed last visit and will be uploaded to her record  Symptom Management/Plan: 1. Chronic respiratory failure with hypoxia (HCC) -back up to 3L via [place] as too winded with  2L -has a friend willing to give her a portable oxygen machine--educated that she will likely have a higher need on a portable machine and suggested she try it first in her home before using it to go to church which is what she wants to do -breathing does not appear any better than last visit--was quite winded when I arrived--had just gotten off the telephone with a prolonged conversation -sats 97% on 3L though -underlying lung cancer previously treated with XRT and area may have grown, but nothing more to be offered and pt does not want anything else done  2. Frailty syndrome in geriatric patient -sounds like she's not eating what she used to, cooks less, and sits more, but still doing her own adls  -has grandchildren and great grandchildren that help look after her--great grandson was there today  3. Chronic diarrhea -better with one tablet of imodium daily (we'd tried two every other day, but that was not quite working--she says she's "not blowing everything out" this way -she canceled her GI appt due to improvement and wondering what would really be done anyway  Follow up Palliative Care Visit: Palliative care will continue to follow for complex medical decision making, advance care planning, and clarification of goals.   This visit was coded based on medical decision making (MDM).  MOST returned to patient to keep on her fridge  PPS: 50%  HOSPICE ELIGIBILITY/DIAGNOSIS: TBD/lung  cancer or COPD  Chief Complaint: Follow-up palliative visit  HISTORY OF PRESENT ILLNESS:  Ariel Medina is a 88 y.o. year old female  with LUL lung ca with prior XRT but growth, COPD, chronic respiratory failure with hypoxia, htn, hypothyroidism, and diarrhea since prior bowel surgery seen in palliative follow-up.   Doing ok--hanging in. Continues to eat well.  Using 3L O2 now based on home health nursing assessment.  Pt wants to get portable O2 so she can go to church--misses that the most.   Says she loves  salt and had a ham biscuit from [employer] this am that was delicious.  Talks about her heart healthy diet from the hospital and how she knows salts not good for her, but she loves it anyway.    Loose bms are improved with one imodium daily.    History obtained from review of EMR, discussion with primary team, and interview with family, facility staff/caregiver and/or [name].  I reviewed available labs, medications, imaging, studies and related documents from the EMR.  Records reviewed and summarized above.   ROS  General: NAD, cold all the time EYES: denies vision changes ENMT: denies dysphagia Cardiovascular: denies chest pain, denies DOE but appears to have it Pulmonary: denies cough, denies increased SOB but appears dyspneic Abdomen: endorses good appetite, denies constipation, endorses continence of bowel as long as she does not have diarrhea (controlled better now) GU: denies dysuria, endorses some incontinence of urine--uses pads MSK:  has increased weakness,  no falls reported, uses rollator walker Skin: denies rashes or wounds Neurological: denies pain, denies insomnia Psych: Endorses positive mood Heme/lymph/immuno: easily bruises, abnormal bleeding  Physical Exam: Current and past weights:  126 lbs on 11/07/21 Constitutional: NAD General: frail appearing, thin EYES: anicteric sclera, lids intact, no discharge  ENMT: intact hearing, oral mucous membranes moist, dentition intact CV: S1S2, RRR, chronic nonpitting edema with venous stasis changes Pulmonary: LCTA, increased work of breathing with prolonged speech, no cough, 3L via [place] Abdomen: intake 100% but of smaller portions than she once ate, normo-active BS + 4 quadrants, soft and non tender, no ascites GU: deferred MSK:  sarcopenia, moves all extremities, ambulatory with rollator Skin: warm and dry, no rashes or wounds on visible skin Neuro:   generalized weakness,  no cognitive impairment Psych: non-anxious affect, A  and O x 3 Hem/lymph/immuno: no widespread bruising  CURRENT PROBLEM LIST:  Patient Active Problem List   Diagnosis Date Noted   Hyponatremia 11/10/2021   Lung mass 11/08/2021   Acute hypoxemic respiratory failure ([place]) 11/08/2021   Acute respiratory failure with hypoxia ([place]) 11/07/2021   Cough 11/07/2021   Chronic diarrhea 11/07/2021   Solitary pulmonary nodule 07/09/2020   Abnormal findings on diagnostic imaging of lung 05/30/2020   Acute deep vein thrombosis (DVT) of proximal vein of both lower extremities ([place]) 05/30/2020   VTE (venous thromboembolism) 05/30/2020   Goals of care, counseling/discussion    Palliative care by specialist    DNR (do not resuscitate) discussion    Intussusception ([place]) 04/29/2020   Pulmonary embolism, bilateral ([place]) 04/29/2020   Hypoxia 04/29/2020   Acute pulmonary embolism ([place]) 04/29/2020   Pre-ulcerative calluses 01/25/2020   Essential hypertension 11/29/2016   Closed fracture of multiple pubic rami ([place]) 11/29/2016   Fall    Closed fracture of left pubis ([place]) 11/28/2016   Hypokalemia 11/28/2016   Hearing loss 09/19/2015   PCP NOTES >>> 05/13/2015   Allergic rhinitis 10/24/2014   Annual physical exam 04/14/2011  BACK PAIN, shoulder pain 12/11/2007   Osteoporosis 10/20/2007   NEOPLASM, MALIGNANT, BREAST, HX OF 10/20/2007   Hypothyroidism 01/30/2007   Hyperlipidemia 01/30/2007   HTN and mild edema 01/30/2007   OSTEOARTHRITIS 01/30/2007   PAST MEDICAL HISTORY:  Active Ambulatory Problems    Diagnosis Date Noted   Hypothyroidism 01/30/2007   Hyperlipidemia 01/30/2007   HTN and mild edema 01/30/2007   OSTEOARTHRITIS 01/30/2007   BACK PAIN, shoulder pain 12/11/2007   Osteoporosis 10/20/2007   NEOPLASM, MALIGNANT, BREAST, HX OF 10/20/2007   Annual physical exam 04/14/2011   Allergic rhinitis 10/24/2014   PCP NOTES >>> 05/13/2015   Closed fracture of left pubis ([place]) 11/28/2016   Hypokalemia 11/28/2016   Fall    Essential  hypertension 11/29/2016   Closed fracture of multiple pubic rami ([place]) 11/29/2016   Hearing loss 09/19/2015   Pre-ulcerative calluses 01/25/2020   Intussusception ([place]) 04/29/2020   Pulmonary embolism, bilateral ([place]) 04/29/2020   Hypoxia 04/29/2020   Acute pulmonary embolism ([place]) 04/29/2020   Goals of care, counseling/discussion    Palliative care by specialist    DNR (do not resuscitate) discussion    Abnormal findings on diagnostic imaging of lung 05/30/2020   Acute deep vein thrombosis (DVT) of proximal vein of both lower extremities ([place]) 05/30/2020   VTE (venous thromboembolism) 05/30/2020   Solitary pulmonary nodule 07/09/2020   Acute respiratory failure with hypoxia ([place]) 11/07/2021   Cough 11/07/2021   Chronic diarrhea 11/07/2021   Lung mass 11/08/2021   Acute hypoxemic respiratory failure ([place]) 11/08/2021   Hyponatremia 11/10/2021   Resolved Ambulatory Problems    Diagnosis Date Noted   Superficial phlebitis 10/05/2013   Acute pharyngitis 10/24/2014   Bronchitis 10/24/2014   GI bleed 04/29/2020   Dehydration 11/07/2021   Past Medical History:  Diagnosis Date   Anemia    Breast CA ([place])    Diverticulitis of colon    Hiatal hernia    History of radiation therapy 08/05/2020   Hypertension    Intussusception of intestine ([place]) 2021   Osteoarthritis    Osteopenia    Pulmonary embolism (HCC)/DVT    Syncope 2005   SOCIAL HX:  Social History   Tobacco Use   Smoking status: Never   Smokeless tobacco: Never  Substance Use Topics   Alcohol use: No     ALLERGIES:  Allergies  Allergen Reactions   Ibuprofen     REACTION: bleed     PERTINENT MEDICATIONS:  Outpatient Encounter Medications as of 12/03/2021  Medication Sig   albuterol (VENTOLIN HFA) 108 (90 Base) MCG/ACT inhaler Inhale 2 puffs into the lungs every 6 (six) hours as needed for wheezing or shortness of breath.   amLODipine (NORVASC) 5 MG tablet Take 1 tablet (5 mg total) by mouth daily.    benzonatate (TESSALON) 100 MG capsule Take 1 capsule (100 mg total) by mouth 3 (three) times daily as needed for cough.   budesonide-formoterol (SYMBICORT) 160-4.5 MCG/ACT inhaler Inhale 2 puffs into the lungs in the morning and at bedtime.   ELIQUIS 5 MG TABS tablet TAKE 1 TABLET TWICE DAILY   fluticasone (FLONASE) 50 MCG/ACT nasal spray Place 2 sprays into both nostrils daily as needed for allergies or rhinitis.   guaiFENesin (MUCINEX) 600 MG 12 hr tablet Take 2 tablets (1,200 mg total) by mouth 2 (two) times daily.   hydrochlorothiazide (HYDRODIURIL) 25 MG tablet Take 0.5 tablets (12.5 mg total) by mouth daily.   levothyroxine (SYNTHROID) 75 MCG tablet Take 1 tablet (75 mcg  total) by mouth daily before breakfast.   loperamide (IMODIUM) 2 MG capsule Take 1 capsule (2 mg total) by mouth daily as needed for diarrhea or loose stools.   Multiple Vitamins-Minerals (PRESERVISION AREDS) TABS Take 1 tablet by mouth daily.   potassium chloride (KLOR-CON M) 10 MEQ tablet Take 1 tablet (10 mEq total) by mouth once a week.   No facility-administered encounter medications on file as of 12/03/2021.   Thank you for the opportunity to participate in the care of [name].  The palliative care team will continue to follow. Please call our office at [phone] if we can be of additional assistance.   Hollace Kinnier, DO  COVID-19 PATIENT SCREENING TOOL Asked and negative response unless otherwise noted:  Have you had symptoms of covid, tested positive or been in contact with someone with symptoms/positive test in the past 5-10 days? no

## 2021-12-08 ENCOUNTER — Other Ambulatory Visit: Payer: Self-pay

## 2021-12-08 MED ORDER — AMLODIPINE BESYLATE 5 MG PO TABS: 5.0000 mg | ORAL_TABLET | Freq: Every day | ORAL | 1 refills | Status: DC

## 2021-12-15 ENCOUNTER — Other Ambulatory Visit: Payer: Self-pay | Admitting: Internal Medicine

## 2021-12-17 ENCOUNTER — Telehealth: Payer: Self-pay | Admitting: Internal Medicine

## 2021-12-17 NOTE — Telephone Encounter (Signed)
Grandaughter stated that she was not with pt and asked if we could call pt at home.  Tried calling pt at home and there was no answer and no vm. ?

## 2021-12-17 NOTE — Telephone Encounter (Signed)
She was prescribed loperamide by another physician a month ago, please reach out to the patient: ?Is she is having severe diarrhea, is she taking it daily?   ?Any fever chills weight loss?Marland Kitchen ?Schedule a visit, virtual okay, if needed. ?

## 2021-12-17 NOTE — Telephone Encounter (Signed)
Okay to send prescription?  ?

## 2021-12-17 NOTE — Telephone Encounter (Signed)
Medication: loperamide (IMODIUM) 2 MG capsule ? ?Has the patient contacted their pharmacy? Yes.   ? ?Preferred Pharmacy (with phone number or street name):  ?[facility] [place]  ?[address] [place] [address]  ?Phone:  [phone]  Fax:  [phone]  ? ?Agent: Please be advised that RX refills may take up to 3 business days. We ask that you follow-up with your pharmacy.  ?

## 2021-12-21 ENCOUNTER — Telehealth: Payer: Self-pay | Admitting: Internal Medicine

## 2021-12-21 NOTE — Telephone Encounter (Signed)
Will send to palliative care to see if they can assist [place] w/ this.  ?

## 2021-12-21 NOTE — Telephone Encounter (Signed)
Pt called stating she would like to see about getting a portable O2 tank or machine so that she can easily run errands without having to lug around a large tank.  ?

## 2021-12-23 ENCOUNTER — Telehealth: Payer: Self-pay | Admitting: Internal Medicine

## 2021-12-23 NOTE — Telephone Encounter (Signed)
Pt advised.

## 2021-12-23 NOTE — Telephone Encounter (Signed)
Pt would like to know if Dr.Paz is able to help her with getting a portable oxygen tank. Please advise.  ?

## 2021-12-23 NOTE — Telephone Encounter (Signed)
Pt is seeing palliative care- I have sent the message from earlier in the week to them for assistance.  ?

## 2022-01-01 ENCOUNTER — Telehealth: Payer: Medicare HMO

## 2022-01-01 ENCOUNTER — Telehealth: Payer: Self-pay | Admitting: Pharmacist

## 2022-01-01 NOTE — Telephone Encounter (Signed)
  Care Management   Follow Up Note   01/01/2022 Name: Ariel Medina MRN: [id number] DOB: February 05, 1928   Referred by: Colon Branch, MD Reason for referral : No chief complaint on file.   An unsuccessful telephone outreach was attempted today. The patient was referred to the case management team for assistance with care management and care coordination.  Left VM message for patient with my contact numbers - [phone] or [phone]  Follow Up Plan: The care management team will reach out to the patient again over the next 30 days.   Cherre Robins, PharmD Clinical Pharmacist [facility]

## 2022-01-06 ENCOUNTER — Ambulatory Visit: Payer: Medicare HMO | Admitting: Podiatry

## 2022-01-07 ENCOUNTER — Ambulatory Visit: Payer: Medicare HMO | Admitting: Nurse Practitioner

## 2022-01-07 ENCOUNTER — Encounter: Payer: Self-pay | Admitting: Nurse Practitioner

## 2022-01-07 VITALS — BP 118/66 | HR 73 | Temp 98.2°F | Ht 62.0 in | Wt 126.0 lb

## 2022-01-07 DIAGNOSIS — J9601 Acute respiratory failure with hypoxia: Secondary | ICD-10-CM

## 2022-01-07 DIAGNOSIS — J129 Viral pneumonia, unspecified: Secondary | ICD-10-CM | POA: Insufficient documentation

## 2022-01-07 DIAGNOSIS — R918 Other nonspecific abnormal finding of lung field: Secondary | ICD-10-CM

## 2022-01-07 NOTE — Progress Notes (Signed)
@Patient  ID: Ariel Medina, female    DOB: [birth year], 88 y.o.   MRN: [id number]  Chief Complaint  Patient presents with   Follow-up    Pt is here to try to get a poc order. She is currently on the tanks. She is currently on 4L of continuous oxygen. Pt is on [facility] and a nurse comes out to service her and her oxygen.     Referring provider: Colon Branch, MD  HPI: 88 year old female, former smoker followed for lung mass suspected to be slow growing NSCLC. Due to her age and risk factors, she never underwent tissue biopsy and was treated with empiric SBRT by radiation oncology from 07/29/2020-08/05/2020. She then underwent repeat CT scan which showed increased LUL mass and subsequent PET showed area was still hypermetabolic, concerning for recurrence of disease. She discussed treatment options with Dr. Sondra Come, who recommended conventional radiation if she wished to move forward with treatment. She decided against this at the time. Repeat imaging from January showed interval increase in the LUL mass. She has not followed up with Dr. Sondra Come since.   She was hospitalized last month from 11/07/2021-11/16/2021 due to acute respiratory failure and viral pneumonia from metapneumovirus. She was initially treated with empiric IV abx but these were discontinued. She then had increasing oxygen requirements so PCCM was consulted who suspected her symptoms were related to a viral pna, slow to recover due to advanced age and slow aging of lungs. They recommended mucociliary clearance therapies and home O2.   TEST/EVENTS:  08/13/2021 CT chest wo con: atherosclerosis. Interval increased LUL masslike soft tissue measuring 3.4x3.1 cm, which was previously 2.4x1.7 cm. Similar surrounding post radiation changes. There is a trace left pneumothorax, which is increased in size when compared to prior exam. There is also an increased small left pleural effusion with left basilar atelectasis.  11/13/2021 CXR 1 view: vertical  linear opacity of the right hemithorax, likely skin fold artifact. There are unchanged small b/l pleural effusions and atelectasis. Stable nodular opacity of the LUL, better visualized on previous CT.   01/07/2022: Today - follow up Patient presents today with granddaughter for hospital follow up. She reports feeling much better since her hospital stay. She still has some mild DOE but overall has improved. Her cough has resolved and she does not have any more chest congestion. She does continue to require supplemental oxygen, which she currently has on at 4 lpm which is what she was discharged on. She denies any fevers, anorexia, malaise, wheezing, or hemoptysis. She continues on Symbicort and rarely uses her rescue inhaler. We discussed that her LUL mass had increased in size on her scan from January; however, she hasn't had follow up with Dr. Sondra Come since. She is unsure if she wants to do conventional radiation or not. She does have good family support so she is considering it.   Allergies  Allergen Reactions   Ibuprofen     REACTION: bleed    Immunization History  Administered Date(s) Administered   Fluad Quad(high Dose 65+) 06/02/2020   H1N1 08/29/2008   Influenza Whole 05/12/2010   Influenza, High Dose Seasonal PF 06/07/2013, 05/13/2015, 07/20/2016, 04/20/2017, 05/25/2018, 05/28/2019   Influenza,inj,Quad PF,6+ Mos 06/10/2014   Influenza-Unspecified 06/01/2021   PFIZER(Purple Top)SARS-COV-2 Vaccination 09/30/2019, 10/24/2019, 07/31/2020   PPD Test 12/14/2016   Pneumococcal Conjugate-13 06/10/2014   Pneumococcal Polysaccharide-23 07/10/2003, 02/21/2008, 11/23/2017   Td 05/16/2007, 05/25/2018   Zoster Recombinat (Shingrix) 10/12/2018, 06/28/2019   Zoster, Live 12/13/2012  Past Medical History:  Diagnosis Date   Anemia    Breast CA ([place])    left breast-invasive ductal ca stage 1-stopped arimidex 2008   Diverticulitis of colon    GI bleed 04/2020   Hiatal hernia    History of  radiation therapy 08/05/2020   54 Gray in 3 fractions to the left upper lung nodule 07/29/2020-08/05/2020.   Dr Gery Pray   Hyperlipidemia    Hypertension    Hypothyroidism    Intussusception of intestine ([place]) 2021   Lung mass    Osteoarthritis    Osteopenia    DEXA 7/02   Pulmonary embolism (HCC)/DVT    Syncope 2005   CT head (-), ECHO essent. neg, stress test (-), carotid u/s (-)    Tobacco History: Social History   Tobacco Use  Smoking Status Never  Smokeless Tobacco Never   Counseling given: Not Answered   Outpatient Medications Prior to Visit  Medication Sig Dispense Refill   albuterol (VENTOLIN HFA) 108 (90 Base) MCG/ACT inhaler Inhale 2 puffs into the lungs every 6 (six) hours as needed for wheezing or shortness of breath. 8 g 0   amLODipine (NORVASC) 5 MG tablet Take 1 tablet (5 mg total) by mouth daily. 90 tablet 1   benzonatate (TESSALON) 100 MG capsule Take 1 capsule (100 mg total) by mouth 3 (three) times daily as needed for cough. 20 capsule 0   budesonide-formoterol (SYMBICORT) 160-4.5 MCG/ACT inhaler Inhale 2 puffs into the lungs in the morning and at bedtime. 1 each 0   ELIQUIS 5 MG TABS tablet TAKE 1 TABLET TWICE DAILY 180 tablet 0   fluticasone (FLONASE) 50 MCG/ACT nasal spray Place 2 sprays into both nostrils daily as needed for allergies or rhinitis. 48 g 0   guaiFENesin (MUCINEX) 600 MG 12 hr tablet Take 2 tablets (1,200 mg total) by mouth 2 (two) times daily. 30 tablet 0   hydrochlorothiazide (HYDRODIURIL) 25 MG tablet Take 0.5 tablets (12.5 mg total) by mouth daily.     levothyroxine (SYNTHROID) 75 MCG tablet Take 1 tablet (75 mcg total) by mouth daily before breakfast. 90 tablet 0   loperamide (IMODIUM) 2 MG capsule Take 1 capsule (2 mg total) by mouth daily as needed for diarrhea or loose stools.     Multiple Vitamins-Minerals (PRESERVISION AREDS) TABS Take 1 tablet by mouth daily.     potassium chloride (KLOR-CON M) 10 MEQ tablet TAKE 1 TABLET EVERY  DAY 90 tablet 1   No facility-administered medications prior to visit.     Review of Systems:   Constitutional: No weight loss or gain, night sweats, fevers, chills, fatigue, or lassitude. HEENT: No headaches, difficulty swallowing, tooth/dental problems, or sore throat. No sneezing, itching, ear ache, nasal congestion, or post nasal drip CV:  No chest pain, orthopnea, PND, swelling in lower extremities, anasarca, dizziness, palpitations, syncope Resp: No shortness of breath with exertion or at rest. No excess mucus or change in color of mucus. No productive or non-productive. No hemoptysis. No wheezing.  No chest wall deformity GI:  No heartburn, indigestion, abdominal pain, nausea, vomiting, diarrhea, change in bowel habits, loss of appetite, bloody stools.  Skin: No rash, lesions, ulcerations MSK:  No joint pain or swelling.  No decreased range of motion.  No back pain. Neuro: No dizziness or lightheadedness.  Psych: No depression or anxiety. Mood stable.     Physical Exam:  BP 118/66 (BP Location: Left Arm, Patient Position: Sitting, Cuff Size: Normal)   Pulse 73  Temp 98.2 F (36.8 C) (Oral)   Ht 5\' 2"  (1.575 m)   Wt 126 lb (57.2 kg)   SpO2 99%   BMI 23.05 kg/m   GEN: Pleasant, interactive, chronically-ill appearing, elderly, frail; in no acute distress. HEENT:  Normocephalic and atraumatic.  PERRLA. Sclera white. Nasal turbinates pink, moist and patent bilaterally. No rhinorrhea present. Oropharynx pink and moist, without exudate or edema. No lesions, ulcerations, or postnasal drip.  NECK:  Supple w/ fair ROM. No JVD present. Normal carotid impulses w/o bruits. Thyroid symmetrical with no goiter or nodules palpated. No lymphadenopathy.   CV: RRR, no m/r/g, no peripheral edema. Pulses intact, +2 bilaterally. No cyanosis, pallor or clubbing. PULMONARY:  Unlabored, regular breathing. Clear bilaterally A&P w/o wheezes/rales/rhonchi. No accessory muscle use. No dullness to  percussion. GI: BS present and normoactive. Soft, non-tender to palpation. No organomegaly or masses detected. No CVA tenderness. MSK: No erythema, warmth or tenderness. Cap refil <2 sec all extrem. No deformities or joint swelling noted.  Neuro: A/Ox3. No focal deficits noted.   Skin: Warm, no lesions or rashe Psych: Normal affect and behavior. Judgement and thought content appropriate.     Lab Results:  CBC    Component Value Date/Time   WBC 5.1 11/27/2021 1141   RBC 4.13 11/27/2021 1141   HGB 12.9 11/27/2021 1141   HGB 13.0 11/11/2011 1013   HGB 14.4 10/05/2007 1009   HCT 39.0 11/27/2021 1141   HCT 39.3 11/11/2011 1013   HCT 41.5 10/05/2007 1009   PLT 243.0 11/27/2021 1141   PLT 280 11/11/2011 1013   PLT 270 10/05/2007 1009   MCV 94.6 11/27/2021 1141   MCV 87 11/11/2011 1013   MCV 92.2 10/05/2007 1009   MCH 30.8 11/11/2021 0324   MCHC 33.1 11/27/2021 1141   RDW 14.3 11/27/2021 1141   RDW 14.1 11/11/2011 1013   RDW 13.3 10/05/2007 1009   LYMPHSABS 0.9 11/27/2021 1141   LYMPHSABS 1.2 11/11/2011 1013   LYMPHSABS 1.5 10/05/2007 1009   MONOABS 0.5 11/27/2021 1141   MONOABS 0.6 10/05/2007 1009   EOSABS 0.1 11/27/2021 1141   EOSABS 0.1 11/11/2011 1013   BASOSABS 0.0 11/27/2021 1141   BASOSABS 0.0 11/11/2011 1013   BASOSABS 0.0 10/05/2007 1009    BMET    Component Value Date/Time   NA 138 11/27/2021 1141   NA 140 12/06/2016 0000   K 3.9 11/27/2021 1141   CL 95 (L) 11/27/2021 1141   CO2 35 (H) 11/27/2021 1141   GLUCOSE 84 11/27/2021 1141   BUN 10 11/27/2021 1141   BUN 14 12/06/2016 0000   CREATININE 0.55 11/27/2021 1141   CREATININE 0.67 06/02/2020 1215   CALCIUM 9.2 11/27/2021 1141   GFRNONAA >60 11/13/2021 0342   GFRAA >60 05/12/2020 0330    BNP    Component Value Date/Time   BNP 80.7 11/07/2021 0928     Imaging:  No results found.        View : No data to display.          No results found for: NITRICOXIDE      Assessment & Plan:    Viral pneumonia Hospitalized from 4/1-4/10 for acute respiratory failure and questionable pneumonia r/t metapneumovirus. CXR was stable with no new consolidation/infiltrate upon review. Clinically improved.   Patient Instructions  Continue Symbicort 2 puffs Twice daily. Brush tongue and rinse mouth afterwards Continue Albuterol inhaler 2 puffs every 6 hours as needed for shortness of breath or wheezing. Notify if symptoms persist  despite rescue inhaler/neb use. Continue flonase nasal spray 2 sprays each nostril daily for allergies or runny nose Continue mucinex 1200 mg Twice daily for chest congestion Continue supplemental oxygen 4 lpm POC with activity/out of house and 3 lpm continuous when at home and at night. Goal oxygen >88-90%  Flutter valve 2-3 times a day   Follow up with Dr. Clabe Seal office  Follow up in one month with Dr. Valeta Harms. If symptoms do not improve or worsen, please contact office for sooner follow up or seek emergency care.    Acute respiratory failure with hypoxia (HCC) Ongoing oxygen requirement since her hospitalization. We were able to qualify her for POC today with 4 lpm - walked with forehead probe. Advised she use 3 lpm continuous when at home and at night. We will continue to work on weaning her requirements as able to maintain oxygen >88-90%.  Lung mass Treated empirically with SBRT; interval increase of LUL mass on imaging from PET in September and CT in January. She had seen Dr. Sondra Come in September who discussed her treatment options which are limited due to her age and risk factors. They discussed conventional radiation; however, at the time patient did not want to do this. We discussed further today and she is considering radiation but hesitant due to the length of treatment and what it would require of her. I advised she schedule an appt with Dr. Sondra Come to discuss further and determine scheduling for repeat imaging, if appropriate.    I spent 35 minutes of  dedicated to the care of this patient on the date of this encounter to include pre-visit review of records, face-to-face time with the patient discussing conditions above, post visit ordering of testing, clinical documentation with the electronic health record, making appropriate referrals as documented, and communicating necessary findings to members of the patients care team.  Clayton Bibles, NP 01/07/2022  Pt aware and understands NP's role.

## 2022-01-07 NOTE — Chronic Care Management (AMB) (Signed)
  Chronic Care Management Note  01/07/2022 Name: Ariel Medina MRN: [id number] DOB: Dec 14, 1927  Ariel Medina is a 88 y.o. year old female who is a primary care patient of Colon Branch, MD and is actively engaged with the care management team. I reached out to Ariel Medina by phone today to assist with re-scheduling a follow up visit with the Pharmacist  Follow up plan: Telephone appointment with care management team member scheduled for: 01/25/2022  Julian Hy, Erma, [facility]  Direct Dial: [phone]

## 2022-01-07 NOTE — Assessment & Plan Note (Signed)
Hospitalized from 4/1-4/10 for acute respiratory failure and questionable pneumonia r/t metapneumovirus. CXR was stable with no new consolidation/infiltrate upon review. Clinically improved.   Patient Instructions  Continue Symbicort 2 puffs Twice daily. Brush tongue and rinse mouth afterwards Continue Albuterol inhaler 2 puffs every 6 hours as needed for shortness of breath or wheezing. Notify if symptoms persist despite rescue inhaler/neb use. Continue flonase nasal spray 2 sprays each nostril daily for allergies or runny nose Continue mucinex 1200 mg Twice daily for chest congestion Continue supplemental oxygen 4 lpm POC with activity/out of house and 3 lpm continuous when at home and at night. Goal oxygen >88-90%  Flutter valve 2-3 times a day   Follow up with Dr. Clabe Seal office  Follow up in one month with Dr. Valeta Harms. If symptoms do not improve or worsen, please contact office for sooner follow up or seek emergency care.

## 2022-01-07 NOTE — Assessment & Plan Note (Signed)
Treated empirically with SBRT; interval increase of LUL mass on imaging from PET in September and CT in January. She had seen Dr. Sondra Come in September who discussed her treatment options which are limited due to her age and risk factors. They discussed conventional radiation; however, at the time patient did not want to do this. We discussed further today and she is considering radiation but hesitant due to the length of treatment and what it would require of her. I advised she schedule an appt with Dr. Sondra Come to discuss further and determine scheduling for repeat imaging, if appropriate.

## 2022-01-07 NOTE — Patient Instructions (Addendum)
Continue Symbicort 2 puffs Twice daily. Brush tongue and rinse mouth afterwards Continue Albuterol inhaler 2 puffs every 6 hours as needed for shortness of breath or wheezing. Notify if symptoms persist despite rescue inhaler/neb use. Continue flonase nasal spray 2 sprays each nostril daily for allergies or runny nose Continue mucinex 1200 mg Twice daily for chest congestion Continue supplemental oxygen 4 lpm POC with activity/out of house and 3 lpm continuous when at home and at night. Goal oxygen >88-90%  Flutter valve 2-3 times a day   Follow up with Dr. Clabe Seal office  Follow up in one month with Dr. Valeta Harms. If symptoms do not improve or worsen, please contact office for sooner follow up or seek emergency care.

## 2022-01-07 NOTE — Assessment & Plan Note (Signed)
Ongoing oxygen requirement since her hospitalization. We were able to qualify her for POC today with 4 lpm - walked with forehead probe. Advised she use 3 lpm continuous when at home and at night. We will continue to work on weaning her requirements as able to maintain oxygen >88-90%.

## 2022-01-11 ENCOUNTER — Other Ambulatory Visit: Payer: Self-pay | Admitting: Internal Medicine

## 2022-01-11 DIAGNOSIS — I2699 Other pulmonary embolism without acute cor pulmonale: Secondary | ICD-10-CM

## 2022-01-11 NOTE — Telephone Encounter (Signed)
Okay to refill Eliquis?

## 2022-01-11 NOTE — Telephone Encounter (Signed)
Okay to refill, prescription sent

## 2022-01-25 ENCOUNTER — Ambulatory Visit (INDEPENDENT_AMBULATORY_CARE_PROVIDER_SITE_OTHER): Payer: Medicare HMO | Admitting: Pharmacist

## 2022-01-25 ENCOUNTER — Telehealth: Payer: Self-pay | Admitting: Nurse Practitioner

## 2022-01-25 DIAGNOSIS — E039 Hypothyroidism, unspecified: Secondary | ICD-10-CM

## 2022-01-25 DIAGNOSIS — J9601 Acute respiratory failure with hypoxia: Secondary | ICD-10-CM

## 2022-01-25 DIAGNOSIS — Z79899 Other long term (current) drug therapy: Secondary | ICD-10-CM

## 2022-01-25 DIAGNOSIS — I10 Essential (primary) hypertension: Secondary | ICD-10-CM

## 2022-01-25 DIAGNOSIS — E871 Hypo-osmolality and hyponatremia: Secondary | ICD-10-CM

## 2022-01-25 MED ORDER — HYDROCHLOROTHIAZIDE 25 MG PO TABS: 12.5000 mg | ORAL_TABLET | Freq: Every day | ORAL | 0 refills | Status: DC

## 2022-01-25 NOTE — Chronic Care Management (AMB) (Signed)
Chronic Care Management Pharmacy Note  01/25/2022 Name:  Ariel Medina MRN:  [id number] DOB:  1928/02/27  Summary:  COPD: patient has not been using Symbicort daily. Discussed difference between maintenance and rescue inhalers. Ordered refill of Symbicort from [facility]. Patient endorsed she would start Symbicort 19mg - inhaler 2 puffs twice a day. Patient reminded to rinse mouth after each use of Symbicort. Patient also has not received portable oxygen concentrator - noted phone request by patient to pulmonology office this morning.  Diarrhea: patient has been taking Imodium daily as needed for loose stools since hospitalization in April. Reports today that BM have been regular. Rare diarrhea or constipation.  Hypertension: patient was started on amlodipine 11/2021. She reports that she needs [facility]ill[facility] 90 days of amlodipine 12/08/2021. Patient to check to see if she has a second bottle at home. Last office blood pressure was at goal. Updated Rx for [facility]oth[facility]ide 270[facility] as patient had Rx for previous directions filled 12/16/2021 for 90 tablets which is 180 days suppyl = 6 months.  Hypokalemia: patient was to restart potassium supplement once daily after hospitalization however she states she was given instruction to take once weekly. Per Dr PaEthel Ranaast lab notes, he instructed her to take potassium 1 tablet daily. Patient voiced understanding. Will have labs rechecked at appointment with PCP in 4 weeks.  Since our last visit patient has been taking Preservision twice a day instead of once daily. She has been able to get on her over-the-counter benefits. She is very grateful for this benefit. [employer] provider $50 per quarter for over-the-counter medications.   Subjective: Ariel ROSNERs an 89.o. year old female who is a primary patient of Paz, JoAlda BertholdMD.  The CCM team was  consulted for assistance with disease management and care coordination needs.    Engaged with patient by telephone for follow up visit in response to provider referral for pharmacy case management and/or care coordination services.   Consent to Services:  The patient was given information about Chronic Care Management services, agreed to services, and gave verbal consent prior to initiation of services.  Please see initial visit note for detailed documentation.   Patient Care Team: PaColon BranchMD as PCP - GeCheral BayMD as PCP - Cardiology (Cardiology) CrThornell SartoriusMD as Consulting Physician (Otolaryngology) MaGardiner BarefootDPM as Consulting Physician (Podiatry) EcCherre RobinsRPH-CPP (Pharmacist)  Recent office visits: 11[facility]27/2021 [facility] Noted to have hyponatremia during hospitalization. Checked CMP. Only taking potassium once per week. Increased to take 1 daily (see lab notes). Changed Imodium to 72m17m take only 1 tablet daily as needed. RCT 3 months.  Recent consult visits: 01/07/2022 - Pulm (Cobb, NP) F/U hospitalization for viral pneumonia, acute respiratory failure and lung mass. Qualified for portable oxygen concntrator. Recommended f/u with Dr KinSondra Come discuss lung mass. F/U 1 month with pulmonary.  12/03/2021 - Palliative Care (Dr ReeMariea Clontsollow up. Noted patient was back to using 3L of O2. Noted patient cancelled GI appointment since diarrhea had improved.  07/06/2021 - podiatrist (Dr MayPrudence Davidsoneen for nail and foot care related to onychomycosis and porokeratosis. 04/20/2021 - Radiology (Dr KinSondra Comehest CT. Recommended biopsy of suspecious area but patient declined due to age and medical issues. Also discussed radiation therapy. Patient considering.    Hospital visits:  11/07/2021 to 11/16/2021 Hospital admission for acute respiratory failure and pneumonia.  Medications started at discharge: albuterol HFA; amLODipine; benzonatate;  budesonide-formoterol inhaler; guaiFENesin; loperamide Medications changed at discharge: hydrochlorothiazide 61m daily decreased to take 0.5 tablet = 12.567mdaily Medications stopped at discharge: cyclobenzeprine 27m20mnd feeding supplement   Objective:  Lab Results  Component Value Date   CREATININE 0.55 11/27/2021   CREATININE 0.68 11/13/2021   CREATININE 0.68 11/12/2021    No results found for: "HGBA1C" Last diabetic Eye exam: No results found for: "HMDIABEYEEXA"  Last diabetic Foot exam: No results found for: "HMDIABFOOTEX"      Component Value Date/Time   CHOL 248 (H) 06/26/2019 0954   TRIG 96.0 06/26/2019 0954   TRIG 77 06/14/2006 0824   HDL 60.60 06/26/2019 0954   CHOLHDL 4 06/26/2019 0954   VLDL 19.2 06/26/2019 0954   LDLCALC 169 (H) 06/26/2019 0954   LDLDIRECT 150.2 06/07/2013 0907       Latest Ref Rng & Units 11/27/2021   11:41 AM 11/08/2021    3:50 AM 11/07/2021    9:28 AM  Hepatic Function  Total Protein 6.0 - 8.3 g/dL 6.0  5.7  6.5   Albumin 3.5 - 5.2 g/dL 3.4  2.9  3.4   AST 0 - 37 U/L _0 ALT 0 - 35 U/L _1 Alk Phosphatase 39 - 117 U/L 52  45  52   Total Bilirubin 0.2 - 1.2 mg/dL 0.6  0.4  0.8     Lab Results  Component Value Date/Time   TSH 3.22 11/27/2021 11:41 AM   TSH 3.47 10/28/2020 10:17 AM   FREET4 1.16 11/27/2021 11:41 AM       Latest Ref Rng & Units 11/27/2021   11:41 AM 11/11/2021    3:24 AM 11/08/2021    3:50 AM  CBC  WBC 4.0 - 10.5 K/uL 5.1  10.6  9.3   Hemoglobin 12.0 - 15.0 g/dL 12.9  13.1  18.8   Hematocrit 36.0 - 46.0 % 39.0  40.0  56.6   Platelets 150.0 - 400.0 K/uL 243.0  302  146     Lab Results  Component Value Date/Time   VD25OH 44 11/11/2011 10:13 AM   VD25OH 40 11/11/2010 10:06 AM    Clinical ASCVD: No  The ASCVD Risk score (Arnett DK, et al., 2019) failed to calculate for the following reasons:   The 2019 ASCVD risk score is only valid for ages 40 21 79 41 Other: (CHADS2VASc if Afib, PHQ9 if  depression, MMRC or CAT for COPD, ACT, DEXA)  Social History   Tobacco Use  Smoking Status Never  Smokeless Tobacco Never   BP Readings from Last 3 Encounters:  01/07/22 118/66  12/03/21 116/68  11/27/21 112/78   Pulse Readings from Last 3 Encounters:  01/07/22 73  12/03/21 67  11/27/21 78   Wt Readings from Last 3 Encounters:  01/07/22 126 lb (57.2 kg)  11/07/21 126 lb (57.2 kg)  06/10/21 122 lb (55.3 kg)    Assessment: Review of patient past medical history, allergies, medications, health status, including review of consultants reports, laboratory and other test data, was performed as part of comprehensive evaluation and provision of chronic care management services.   SDOH:  (Social Determinants of Health) assessments and interventions performed:     CCM Care Plan  Allergies  Allergen Reactions   Ibuprofen     REACTION: bleed  Medications Reviewed Today     Reviewed by Cherre Robins, RPH-CPP (Pharmacist) on 01/25/22 at 46  Med List Status: <None>   Medication Order Taking? Sig Documenting Provider Last Dose Status Informant  albuterol (VENTOLIN HFA) 108 (90 Base) MCG/ACT inhaler [id number] Yes Inhale 2 puffs into the lungs every 6 (six) hours as needed for wheezing or shortness of breath. Aline August, MD Taking Active   amLODipine (NORVASC) 5 MG tablet [id number] Yes Take 1 tablet (5 mg total) by mouth daily. Colon Branch, MD Taking Active   budesonide-formoterol [facility]) 160-4.5 MCG/ACT inhaler [id number] Yes Inhale 2 puffs into the lungs in the morning and at bedtime. Aline August, MD Taking Active   ELIQUIS 5 MG TABS tablet [id number] Yes TAKE 1 TABLET TWICE DAILY Colon Branch, MD Taking Active   fluticasone [facility]) 50 MCG/ACT nasal spray [id number] No Place 2 sprays into both nostrils daily as needed for allergies or rhinitis.  Patient not taking: Reported on 01/25/2022   [name], Nelda Bucks, NP Not Taking Active Self  guaiFENesin (MUCINEX) 600 MG 12 hr  tablet [id number] Yes Take 2 tablets (1,200 mg total) by mouth 2 (two) times daily. Aline August, MD Taking Active   hydrochlorothiazide (HYDRODIURIL) 25 MG tablet [id number] Yes Take 0.5 tablets (12.5 mg total) by mouth daily. Aline August, MD Taking Active   levothyroxine (SYNTHROID) 75 MCG tablet [id number] Yes Take 1 tablet (75 mcg total) by mouth daily before breakfast. Colon Branch, MD Taking Active   loperamide (IMODIUM) 2 MG capsule [id number] Yes Take 1 capsule (2 mg total) by mouth daily as needed for diarrhea or loose stools. Colon Branch, MD Taking Active   Multiple Vitamins-Minerals (PRESERVISION AREDS) TABS [id number] Yes Take 1 tablet by mouth in the morning and at bedtime. [provider] Taking Active Self  potassium chloride (KLOR-CON M) 10 MEQ tablet [id number] No TAKE 1 TABLET EVERY DAY  Patient not taking: Reported on 01/25/2022   Colon Branch, MD Not Taking Active   Med List Note Corky Mull, CPhT 05/08/20 [id number]): [place]            Patient Active Problem List   Diagnosis Date Noted   Viral pneumonia 01/07/2022   Hyponatremia 11/10/2021   Lung mass 11/08/2021   Acute hypoxemic respiratory failure ([place]) 11/08/2021   Acute respiratory failure with hypoxia (HCC) 11/07/2021   Cough 11/07/2021   Chronic diarrhea 11/07/2021   Solitary pulmonary nodule 07/09/2020   Abnormal findings on diagnostic imaging of lung 05/30/2020   Acute deep vein thrombosis (DVT) of proximal vein of both lower extremities ([place]) 05/30/2020   VTE (venous thromboembolism) 05/30/2020   Goals of care, counseling/discussion    Palliative care by specialist    DNR (do not resuscitate) discussion    Intussusception ([place]) 04/29/2020   Pulmonary embolism, bilateral ([place]) 04/29/2020   Hypoxia 04/29/2020   Acute pulmonary embolism ([place]) 04/29/2020   Pre-ulcerative calluses 01/25/2020   Essential hypertension 11/29/2016   Closed fracture of multiple pubic rami ([place]) 11/29/2016    Fall    Closed fracture of left pubis ([place]) 11/28/2016   Hypokalemia 11/28/2016   Hearing loss 09/19/2015   PCP NOTES >>> 05/13/2015   Allergic rhinitis 10/24/2014   Annual physical exam 04/14/2011   BACK PAIN, shoulder pain 12/11/2007   Osteoporosis 10/20/2007   NEOPLASM, MALIGNANT, BREAST, HX OF 10/20/2007   Hypothyroidism 01/30/2007   Hyperlipidemia 01/30/2007   HTN and mild edema 01/30/2007   OSTEOARTHRITIS 01/30/2007  Immunization History  Administered Date(s) Administered   Fluad Quad(high Dose 65+) 06/02/2020   H1N1 08/29/2008   Influenza Whole 05/12/2010   Influenza, High Dose Seasonal PF 06/07/2013, 05/13/2015, 07/20/2016, 04/20/2017, 05/25/2018, 05/28/2019   Influenza,inj,Quad PF,6+ Mos 06/10/2014   Influenza-Unspecified 06/01/2021   PFIZER(Purple Top)SARS-COV-2 Vaccination 09/30/2019, 10/24/2019, 07/31/2020   PPD Test 12/14/2016   Pneumococcal Conjugate-13 06/10/2014   Pneumococcal Polysaccharide-23 07/10/2003, 02/21/2008, 11/23/2017   Td 05/16/2007, 05/25/2018   Zoster Recombinat (Shingrix) 10/12/2018, 06/28/2019   Zoster, Live 12/13/2012    Conditions to be addressed/monitored: HTN, Hypothyroidism, Osteoporosis, and history of PE on long term anticoagulation; history of breast cancer; Lung cancer.   Care Plan : General Pharmacy (Adult)  Updates made by Cherre Robins, RPH-CPP since 01/25/2022 12:00 AM     Problem: Hypertension; osteoporosis; Edema; hypothyroidism, lung cancer; history of breast cancer; history of pulmonary embolism with long term anticoagulation      Long-Range Goal: Provide education, support and care coordination for medication therapy and chronic conditions   Start Date: 06/30/2021  This Visit's Progress: On track  Priority: High  Note:   Current Barriers:  Does not adhere to prescribed medication regimen Does not maintain contact with provider office Need for transportation assistance  Pharmacist Clinical Goal(s):  Over the  next 90 days, patient will maintain control of hypertension  as evidenced by blood pressure <140/90  adhere to prescribed medication regimen as evidenced by getting Prolia injection and refilling medications on time Increase adherence for potassium supplement  through collaboration with PharmD and provider.  Provide transportation support and resources (patient declined 06/30/2021)   Interventions: 1:1 collaboration with Colon Branch, MD regarding development and update of comprehensive plan of care as evidenced by provider attestation and co-signature Inter-disciplinary care team collaboration (see longitudinal plan of care) Comprehensive medication review performed; medication list updated in electronic medical record  Hypertension / Edema / Low potassium: Controlled Current treatment: Hydrochlorothiazide 61m - take 0.5 tablets = 12.531meach morning Amlodipine 25m64m take 1 tablet daily Potassium 10 mEq daily   Current home readings: Not checking blood pressure at home  BP Readings from Last 3 Encounters:  01/07/22 118/66  12/03/21 116/68  11/27/21 112/78  Patient reports she is not taking potassium daily because she thought she was instructed to take 1 tablet weekly. Per Dr PazEthel Ranates she is to take once daily Denies hypotensive/hypertensive symptoms Interventions:  Reminded patient to take potassium 96m53m 1 tablet daily.  Recheck BMP / potassium at next office visit with Dr Paz.Larose KellsContinue to take hydrochlorothiazide 0.5 tablet daily - [facility] patient received Rx for #90 = 180 days 12/16/2021 Patient requested refill for amlodipine but when I spoke with [facility] they mailed 90 days on 12/08/2021 - patient rechecked her bottles and [facility] Last TSH was in therapeutic range Current therapy:  Levothyroxine 725mc61mily  Interventions:  Continue current therapy  COPD / lung  mass:  [facility]  Current therapy:  Symbicort 160/425mcg36mnhaler 2 puffs into lungs twice a day (maintenance inhaler)  Albuterol inhaler - inhale 2 puffs every 6 hours as needed for shortness of breath or wheezing (rescue inhaler)  Interventions:  Inhaler education provided.  Assisted with requesting refill for Symbicort   SDOH:  Discussed medication cost - patient states no issues with medication costs but she is only taking Preservision once a day due  to cost of over-the-counter Preservision. She reports today she is so thankful that she now knows about [employer] $50 per quarter over-the-counter allowance.  Patient has had to cancel appointments due to loaning her grandson her car Interventions:  Education provided regarding [employer] over-the-counter benefits. [facility] and confirmed that patient gets $50 per quarter for over-the-counter products ordered thru [facility]. (Addressed at previous visit)  Mailed patient copy of [employer] over-the-counter product catalog for 2023. (At last visit) Today assisted patient in ordering AREDs vitamins from [facility] over-the-counter with her benefits (has $25 left for quarter and AREDS cost $25) Discussed referral for [facility] services - patient declined. (Addressed at previous visit)   Patient Goals/Self-Care Activities Over the next 90 days, patient will:  take medications as prescribed,  Consider using [facility] services for [facility] facility appointment (call me if you decide to try our transportation service - Cherre Robins [phone]) Take potassium supplement 55mg - 1 tablet daily. If you have difficulty swallowing you can try dissolving potassium in water and drink mixture for easier administration.  Use Symbicort inhaler (maintenance inhaler) every day - 2 inhalations each morning (rinse mouth after use) and 2 inhalations each evening (rinse mouth after use)  Use albuterol as needed (rescue  inhaler) when you are short of breath or wheezing - inhale 2 puffs up to every 6 hours if needed.   Follow Up Plan: Telephone follow up appointment with care management team member scheduled for:  2 months          Medication Assistance:  Education provided about OTC benefits. Catalog mailed and helped patient with first order. She has $50 per quarter to use for over-the-counter products.   [facility]  [facility] [address]Phone: [phone]: [phone] [place]Phone: [phone]: [phone]  Follow Up:  Patient agrees to Care Plan and Follow-up.  Plan: Telephone follow up appointment with care management team member scheduled for:  August 2023  TCherre Robins PharmD Clinical Pharmacist [facility]

## 2022-01-25 NOTE — Patient Instructions (Signed)
[name] It was a pleasure speaking with you again today Below is a summary of your health goals and care plan   Patient Goals/Self-Care Activities take medications as prescribed,  Consider using [facility] transportation services for [facility] facility appointment (call me if you decide to try our transportation service - Cherre Robins [phone]) Take potassium supplement 35mEg - 1 tablet daily. If you have difficulty swallowing you can try dissolving potassium in water and drink mixture for easier administration.   Use Symbicort inhaler (maintenance inhaler) every day - 2 inhalations each morning (rinse mouth after use) and 2 inhalations each evening (rinse mouth after use)        Use albuterol as needed (rescue inhaler) when you are short of breath or wheezing - inhale 2 puffs up to every 6 hours if needed.        If you have any questions or concerns, please feel free to contact me either at the phone number below or with a MyChart message.   Keep up the good work!  Cherre Robins, PharmD Clinical Pharmacist [facility] [place] [phone] (direct line)  [phone] (main office number)   The patient verbalized understanding of instructions, educational materials, and care plan provided today and agreed to receive a mailed copy of patient instructions, educational materials, and care plan.

## 2022-01-25 NOTE — Telephone Encounter (Signed)
Order for POC was placed after pt's OV with [facility] 6/1. Routing to [facility] for assistance.

## 2022-01-28 NOTE — Telephone Encounter (Signed)
Ariel Medina - please follow up on this.

## 2022-01-28 NOTE — Telephone Encounter (Signed)
Order was sent to [facility] on 6/1.  I called Danielle and she was able to pull order.  She is going to check on it and call pt.  I called pt & left her a vm to let her know Ariel Medina will be calling her.  I left her my phone #.  Nothing further needed.

## 2022-02-04 ENCOUNTER — Other Ambulatory Visit: Payer: Self-pay | Admitting: Internal Medicine

## 2022-02-04 ENCOUNTER — Telehealth: Payer: Self-pay | Admitting: Pharmacist

## 2022-02-04 MED ORDER — CYCLOBENZAPRINE HCL 5 MG PO TABS: 5.0000 mg | ORAL_TABLET | Freq: Two times a day (BID) | ORAL | 0 refills | Status: DC | PRN

## 2022-02-04 NOTE — Telephone Encounter (Signed)
Patient called to request refill for cyclobenzaprine 5mg  - not on her med list but she has taken in the past for back pain / muscle spasms. (LF #20 tablets on 07/15/2020)  Back pain started Monday 02/01/22.  She has taken ibuprofen over-the-counter - 3 tablets = 600mg  daily since Monday but no relief.  She does not have anyone to bring her for a face to face appointment today and she does not have capability to do video appointment.  Forwarding to PCP for review and recommendations.

## 2022-02-04 NOTE — Telephone Encounter (Signed)
Left message on VM at home and cell number regarding Rx and to stop IBU and start acetaminophen instead.  Reminder about apptointment for 02/26/2022 at 10:40 for follow up

## 2022-02-04 NOTE — Telephone Encounter (Signed)
Advise patient: I understand her situation. Recommend Tylenol 500 mg: 2 tablets every 8 hours as needed. Stop ibuprofen I sent cyclobenzaprine to to be taken as needed.  Watch for sedation. If pain is severe or persistent >>> needs to be seen

## 2022-02-04 NOTE — Telephone Encounter (Signed)
Pt called back to speak with Tammy, please advise.

## 2022-02-05 DIAGNOSIS — I10 Essential (primary) hypertension: Secondary | ICD-10-CM | POA: Diagnosis not present

## 2022-02-05 DIAGNOSIS — E039 Hypothyroidism, unspecified: Secondary | ICD-10-CM

## 2022-02-05 DIAGNOSIS — J449 Chronic obstructive pulmonary disease, unspecified: Secondary | ICD-10-CM

## 2022-02-05 NOTE — Telephone Encounter (Signed)
Called patient back. Provided instruction regarding cyclobenzaprine and acetaminophen (see previous notes). Also instructed patient to stop ibuprofen.

## 2022-02-12 ENCOUNTER — Other Ambulatory Visit: Payer: Self-pay

## 2022-02-12 DIAGNOSIS — E039 Hypothyroidism, unspecified: Secondary | ICD-10-CM

## 2022-02-12 MED ORDER — LEVOTHYROXINE SODIUM 75 MCG PO TABS: 75.0000 ug | ORAL_TABLET | Freq: Every day | ORAL | 1 refills | Status: DC

## 2022-02-12 MED ORDER — HYDROCHLOROTHIAZIDE 25 MG PO TABS: 12.5000 mg | ORAL_TABLET | Freq: Every day | ORAL | 1 refills | Status: DC

## 2022-02-26 ENCOUNTER — Ambulatory Visit (INDEPENDENT_AMBULATORY_CARE_PROVIDER_SITE_OTHER): Payer: Medicare HMO | Admitting: Internal Medicine

## 2022-02-26 ENCOUNTER — Encounter: Payer: Self-pay | Admitting: Internal Medicine

## 2022-02-26 ENCOUNTER — Ambulatory Visit: Payer: Medicare HMO | Admitting: Internal Medicine

## 2022-02-26 VITALS — BP 126/82 | HR 71 | Temp 98.0°F | Resp 18 | Ht 62.0 in | Wt 124.2 lb

## 2022-02-26 DIAGNOSIS — R918 Other nonspecific abnormal finding of lung field: Secondary | ICD-10-CM | POA: Diagnosis not present

## 2022-02-26 DIAGNOSIS — J449 Chronic obstructive pulmonary disease, unspecified: Secondary | ICD-10-CM | POA: Diagnosis not present

## 2022-02-26 DIAGNOSIS — K529 Noninfective gastroenteritis and colitis, unspecified: Secondary | ICD-10-CM | POA: Diagnosis not present

## 2022-02-26 DIAGNOSIS — E039 Hypothyroidism, unspecified: Secondary | ICD-10-CM

## 2022-02-26 DIAGNOSIS — I10 Essential (primary) hypertension: Secondary | ICD-10-CM

## 2022-02-26 HISTORY — DX: Chronic obstructive pulmonary disease, unspecified: J44.9

## 2022-02-26 NOTE — Progress Notes (Signed)
Subjective:    Patient ID: Ariel Medina, female    DOB: [birth year], 88 y.o.   MRN: [id number]  DOS:  02/26/2022 Type of visit - description: Follow-up, here with her granddaughter  Since the last office visit is doing great. Good med compliance. Appetite is very good. Had diarrhea, that has improved.  Review of Systems See above   Past Medical History:  Diagnosis Date   Anemia    Breast CA ([place])    left breast-invasive ductal ca stage 1-stopped arimidex 2008   Diverticulitis of colon    GI bleed 04/2020   Hiatal hernia    History of radiation therapy 08/05/2020   54 Gray in 3 fractions to the left upper lung nodule 07/29/2020-08/05/2020.   Dr Gery Pray   Hyperlipidemia    Hypertension    Hypothyroidism    Intussusception of intestine ([place]) 2021   Lung mass    Osteoarthritis    Osteopenia    DEXA 7/02   Pulmonary embolism (HCC)/DVT    Syncope 2005   CT head (-), ECHO essent. neg, stress test (-), carotid u/s (-)    Past Surgical History:  Procedure Laterality Date   BREAST BIOPSY Bilateral    BREAST LUMPECTOMY  04/2002   left   PARTIAL COLECTOMY N/A 05/01/2020   Procedure: PARTIAL COLECTOMY;  Surgeon: Coralie Keens, MD;  Location: [place];  Service: General;  Laterality: N/A;   TONSILLECTOMY AND ADENOIDECTOMY      Current Outpatient Medications  Medication Instructions   albuterol (VENTOLIN HFA) 108 (90 Base) MCG/ACT inhaler 2 puffs, Inhalation, Every 6 hours PRN   amLODipine (NORVASC) 5 mg, Oral, Daily   budesonide-formoterol (SYMBICORT) 160-4.5 MCG/ACT inhaler 2 puffs, Inhalation, 2 times daily   cyclobenzaprine (FLEXERIL) 5 mg, Oral, 2 times daily PRN   ELIQUIS 5 MG TABS tablet TAKE 1 TABLET TWICE DAILY   fluticasone (FLONASE) 50 MCG/ACT nasal spray 2 sprays, Each Nare, Daily PRN   guaiFENesin (MUCINEX) 1,200 mg, Oral, 2 times daily   hydrochlorothiazide (HYDRODIURIL) 12.5 mg, Oral, Daily   levothyroxine (SYNTHROID) 75 mcg, Oral, Daily before  breakfast   loperamide (IMODIUM) 2 mg, Oral, Daily PRN   Multiple Vitamins-Minerals (PRESERVISION AREDS) TABS 1 tablet, Oral, 2 times daily   potassium chloride (KLOR-CON M) 10 MEQ tablet TAKE 1 TABLET EVERY DAY       Objective:   Physical Exam BP 126/82   Pulse 71   Temp 98 F (36.7 C) (Oral)   Resp 18   Ht 5\' 2"  (1.575 m)   Wt 124 lb 4 oz (56.4 kg)   SpO2 91%   BMI 22.73 kg/m  General:   Well developed, NAD, BMI noted. HEENT:  Normocephalic . Face symmetric, atraumatic Lungs:  Decreased breath sounds Normal respiratory effort, no intercostal retractions, no accessory muscle use. Heart: RRR,  no murmur.  Lower extremities: Respiratory ankle edema Skin: Not pale. Not jaundice Neurologic:  alert & oriented X3.  Speech normal, gait not tested, sits in a wheelchair Psych--  Cognition and judgment appear intact.  Cooperative with normal attention span and concentration.  Behavior appropriate. No anxious or depressed appearing.      Assessment     Assessment  HTN Hyperlipidemia Hypothyroidism Osteopenia:  --T score -2.1 (04-2012) DC Fosamax 12/2013 after 5 years --T score -1.5   (02-2015)  --T score -3.6 (april 2019) on Prolia DJD GERD  Tremors, likely essential H/o diverticulitis Breast cancer, L breast, release from oncology 2013 Syncope 2005 11-2016:  Fall, FX L pubic ramus, admitted then d/c to a NH, back home 12-18-16 Edema, R>L U/S (-)  for DVT 01/2017  PLAN: COPD: See last visit, she was recovering from a acute on chronic COPD exacerbation. Saw pulmonary 01/07/2022, Was recommended to continue Symbicort, albuterol, oxygen 4 LPM and 3 LPM at night. O2 sat goal 88 to 90%. At this point she is doing well, good compliance w/ meds.  She uses her oxygen at night but during the daytime the nasal cannula bothers her.  I encouraged her to use her oxygen during the daytime for as many hours as possible. She got up portable oxygen tank from a friend.  Hardly ever uses  it. Diarrhea: see LOV, improved HTN: Normal  amb BPs, BP today is very good, continue amlodipine, HCTZ, potassium. Hypothyroidism: Last TSH satisfactory Lung mass: No further evaluation.  See last visit. Palliative care: Has not seen them in a while, encouraged to call if she needs help. Social: Currently her grandson and a great-grandson live with her.  Does not drive.  Uses a walker at home. Vaccine advice provided RTC 3 to 4 months.  Sooner if needed.

## 2022-02-26 NOTE — Patient Instructions (Addendum)
Recommend to proceed with covid vaccine (bivalent) at your pharmacy. Flu shot this fall.  Continue taking the same medications.  Continue using your oxygen at night  Try to use your oxygen during the daytime for as many hours as you can   GO TO THE FRONT DESK, PLEASE SCHEDULE YOUR APPOINTMENTS Come back for   a checkup in 3 to 4 months

## 2022-02-26 NOTE — Assessment & Plan Note (Signed)
COPD: See last visit, she was recovering from a acute on chronic COPD exacerbation. Saw pulmonary 01/07/2022, Was recommended to continue Symbicort, albuterol, oxygen 4 LPM and 3 LPM at night. O2 sat goal 88 to 90%. At this point she is doing well, good compliance w/ meds.  She uses her oxygen at night but during the daytime the nasal cannula bothers her.  I encouraged her to use her oxygen during the daytime for as many hours as possible. She got up portable oxygen tank from a friend.  Hardly ever uses it. Diarrhea: see LOV, improved HTN: Normal  amb BPs, BP today is very good, continue amlodipine, HCTZ, potassium. Hypothyroidism: Last TSH satisfactory Lung mass: No further evaluation.  See last visit. Palliative care: Has not seen them in a while, encouraged to call if she needs help. Social: Currently her grandson and a great-grandson live with her.  Does not drive.  Uses a walker at home. Vaccine advice provided RTC 3 to 4 months.  Sooner if needed.

## 2022-03-02 ENCOUNTER — Ambulatory Visit: Payer: Medicare HMO | Admitting: Pulmonary Disease

## 2022-03-12 ENCOUNTER — Encounter: Payer: Self-pay | Admitting: Podiatry

## 2022-03-12 ENCOUNTER — Ambulatory Visit: Payer: Medicare HMO | Admitting: Podiatry

## 2022-03-12 DIAGNOSIS — Q828 Other specified congenital malformations of skin: Secondary | ICD-10-CM

## 2022-03-12 DIAGNOSIS — M79674 Pain in right toe(s): Secondary | ICD-10-CM | POA: Diagnosis not present

## 2022-03-12 DIAGNOSIS — B351 Tinea unguium: Secondary | ICD-10-CM | POA: Diagnosis not present

## 2022-03-12 DIAGNOSIS — M79675 Pain in left toe(s): Secondary | ICD-10-CM

## 2022-03-12 NOTE — Progress Notes (Signed)
This patient returns to the office for evaluation and treatment of long thick painful nails .  This patient is unable to trim her own nails since the patient cannot reach the feet.  Patient says the nails are painful walking and wearing her shoes.  She has painful callus on the outside of her right foot.    She  returns for preventive foot care services.   General Appearance  Alert, conversant and in no acute stress.  Vascular  Dorsalis pedis and posterior tibial  pulses are weakly  palpable  bilaterally.  Capillary return is within normal limits  bilaterally. Temperature is within normal limits  bilaterally.  Neurologic  Senn-Weinstein monofilament wire test within normal limits  bilaterally. Muscle power within normal limits bilaterally.  Nails Thick disfigured discolored nails with subungual debris  from hallux to fifth toes bilaterally. No evidence of bacterial infection or drainage bilaterally.  Orthopedic  No limitations of motion  feet .  No crepitus or effusions noted.  No bony pathology or digital deformities noted. HAV  B/L  Skin  normotropic skin  noted bilaterally.  No signs of infections or ulcers noted.   Porokeratosis sub 5th met B/L.  Callus secondary HAV  B/L.  Onychomycosis  Pain in toes right foot  Pain in toes left foot  Porokeratosis sub 5th met B/L.  Debridement  of nails  1-5  B/L with a nail nipper.  Nails were then filed using a dremel tool with no incidents.  Debride porokeratosis with  # 15 blade and dremel tool     RTC 10  weeks   Gardiner Barefoot DPM

## 2022-03-18 DIAGNOSIS — E871 Hypo-osmolality and hyponatremia: Secondary | ICD-10-CM | POA: Diagnosis not present

## 2022-03-18 DIAGNOSIS — R918 Other nonspecific abnormal finding of lung field: Secondary | ICD-10-CM | POA: Diagnosis not present

## 2022-03-18 DIAGNOSIS — J9601 Acute respiratory failure with hypoxia: Secondary | ICD-10-CM | POA: Diagnosis not present

## 2022-03-30 ENCOUNTER — Ambulatory Visit (INDEPENDENT_AMBULATORY_CARE_PROVIDER_SITE_OTHER): Payer: Medicare HMO | Admitting: Pharmacist

## 2022-03-30 DIAGNOSIS — J449 Chronic obstructive pulmonary disease, unspecified: Secondary | ICD-10-CM

## 2022-03-30 DIAGNOSIS — E039 Hypothyroidism, unspecified: Secondary | ICD-10-CM

## 2022-03-30 DIAGNOSIS — I10 Essential (primary) hypertension: Secondary | ICD-10-CM

## 2022-03-30 MED ORDER — BUDESONIDE-FORMOTEROL FUMARATE 160-4.5 MCG/ACT IN AERO: 2.0000 | INHALATION_SPRAY | Freq: Two times a day (BID) | RESPIRATORY_TRACT | 0 refills | Status: DC

## 2022-03-30 NOTE — Chronic Care Management (AMB) (Signed)
Chronic Care Management Pharmacy Note  03/30/2022 Name:  Ariel Medina MRN:  [id number] DOB:  Nov 25, 1927  Summary:  COPD: patient has started using Symbicort daily. She would like to get next refilll for Symbicort from [facility] since they deliver. Sent updated Rx to pharmacy for 90 days.  Reviewed difference between maintenance and rescue inhalers.  Patient is using supplemental O2 at night. Checks pulse Ox O2 level - reports has been > 92%. Diarrhea: patient has been taking Imodium daily as needed for loose stools since hospitalization in April. Reports today that BM have been regular. Rare diarrhea or constipation.  Hypertension: Home and office blood pressure has been at goal. Continue current meds for hypertension>  Patient to check to see if she has a second bottle at home. Last office blood pressure was at goal.  Hypokalemia: patient restarted potassium supplement once daily. She reports she has difficulty swallowing potassium so she usually cuts in half and take 2- half tablets without problem.    Subjective: Ariel Medina is an 88 y.o. year old female who is a primary patient of Paz, Alda Berthold, MD.  The CCM team was consulted for assistance with disease management and care coordination needs.    Engaged with patient by telephone for follow up visit in response to provider referral for pharmacy case management and/or care coordination services.   Consent to Services:  The patient was given information about Chronic Care Management services, agreed to services, and gave verbal consent prior to initiation of services.  Please see initial visit note for detailed documentation.   Patient Care Team: Colon Branch, MD as PCP - Cheral Bay, MD as PCP - Cardiology (Cardiology) Thornell Sartorius, MD as Consulting Physician (Otolaryngology) Gardiner Barefoot, DPM as Consulting Physician (Podiatry) Cherre Robins, RPH-CPP (Pharmacist)  Recent office visits: 11/27/2021 - Int Med  (Dr [facility] follow up. Noted to have hyponatremia during hospitalization. Checked CMP. Only taking potassium once per week. Increased to take 1 daily (see lab notes). Changed Imodium to 73m - take only 1 tablet daily as needed. RCT 3 months.  Recent consult visits: 02/26/2022 - Int Med (Dr PLarose Kells Follow up chronic conditions. No medication changes noted. F/U 3 to 4 months. 01/07/2022 - Pulm (Cobb, NP) F/U hospitalization for viral pneumonia, acute respiratory failure and lung mass. Qualified for portable oxygen concntrator. Recommended f/u with Dr KSondra Cometo discuss lung mass. F/U 1 month with pulmonary.  12/03/2021 - Palliative Care (Dr RMariea Clonts Follow up. Noted patient was back to using 3L of O2. Noted patient cancelled GI appointment since diarrhea had improved.   Hospital visits: 11/07/2021 to 11/16/2021 Hospital admission for acute respiratory failure and pneumonia.  Medications started at discharge: albuterol HFA; amLODipine; benzonatate; budesonide-formoterol inhaler; guaiFENesin; loperamide Medications changed at discharge: hydrochlorothiazide 263mdaily decreased to take 0.5 tablet = 12.76m60maily Medications stopped at discharge: cyclobenzeprine 76mg68md feeding supplement   Objective:  Lab Results  Component Value Date   CREATININE 0.55 11/27/2021   CREATININE 0.68 11/13/2021   CREATININE 0.68 11/12/2021    No results found for: "HGBA1C" Last diabetic Eye exam: No results found for: "HMDIABEYEEXA"  Last diabetic Foot exam: No results found for: "HMDIABFOOTEX"      Component Value Date/Time   CHOL 248 (H) 06/26/2019 0954   TRIG 96.0 06/26/2019 0954   TRIG 77 06/14/2006 0824   HDL 60.60 06/26/2019 0954   CHOLHDL 4 06/26/2019 0954   VLDL 19.2 06/26/2019 0954   LDLCALC  169 (H) 06/26/2019 0954   LDLDIRECT 150.2 06/07/2013 0907       Latest Ref Rng & Units 11/27/2021   11:41 AM 11/08/2021    3:50 AM 11/07/2021    9:28 AM  Hepatic Function  Total Protein 6.0 - 8.3 g/dL 6.0   5.7  6.5   Albumin 3.5 - 5.2 g/dL 3.4  2.9  3.4   AST 0 - 37 U/L 15  18  20    ALT 0 - 35 U/L 6  9  9    Alk Phosphatase 39 - 117 U/L 52  45  52   Total Bilirubin 0.2 - 1.2 mg/dL 0.6  0.4  0.8     Lab Results  Component Value Date/Time   TSH 3.22 11/27/2021 11:41 AM   TSH 3.47 10/28/2020 10:17 AM   FREET4 1.16 11/27/2021 11:41 AM       Latest Ref Rng & Units 11/27/2021   11:41 AM 11/11/2021    3:24 AM 11/08/2021    3:50 AM  CBC  WBC 4.0 - 10.5 K/uL 5.1  10.6  9.3   Hemoglobin 12.0 - 15.0 g/dL 12.9  13.1  18.8   Hematocrit 36.0 - 46.0 % 39.0  40.0  56.6   Platelets 150.0 - 400.0 K/uL 243.0  302  146     Lab Results  Component Value Date/Time   VD25OH 44 11/11/2011 10:13 AM   VD25OH 40 11/11/2010 10:06 AM    Clinical ASCVD: No  The ASCVD Risk score (Arnett DK, et al., 2019) failed to calculate for the following reasons:   The 2019 ASCVD risk score is only valid for ages 14 to 60    Other: (CHADS2VASc if Afib, PHQ9 if depression, MMRC or CAT for COPD, ACT, DEXA)  Social History   Tobacco Use  Smoking Status Never  Smokeless Tobacco Never   BP Readings from Last 3 Encounters:  02/26/22 126/82  01/07/22 118/66  12/03/21 116/68   Pulse Readings from Last 3 Encounters:  02/26/22 71  01/07/22 73  12/03/21 67   Wt Readings from Last 3 Encounters:  02/26/22 124 lb 4 oz (56.4 kg)  01/07/22 126 lb (57.2 kg)  11/07/21 126 lb (57.2 kg)    Assessment: Review of patient past medical history, allergies, medications, health status, including review of consultants reports, laboratory and other test data, was performed as part of comprehensive evaluation and provision of chronic care management services.   SDOH:  (Social Determinants of Health) assessments and interventions performed:  SDOH Interventions    Flowsheet Row Most Recent Value  SDOH Interventions   Financial Strain Interventions Other (Comment)  [education provided regarding [employer] OTC benefits. Assisted patient  in requesting order for Preservision vitamins.]  Physical Activity Interventions Other (Comments)  [encouraged to increase length of walk as able.]  Social Connections Interventions Intervention Not Indicated        CCM Care Plan  Allergies  Allergen Reactions   Ibuprofen     REACTION: bleed    Medications Reviewed Today     Reviewed by Cherre Robins, RPH-CPP (Pharmacist) on 03/30/22 at 47  Med List Status: <None>   Medication Order Taking? Sig Documenting Provider Last Dose Status Informant  albuterol (VENTOLIN HFA) 108 (90 Base) MCG/ACT inhaler [id number] Yes Inhale 2 puffs into the lungs every 6 (six) hours as needed for wheezing or shortness of breath. Aline August, MD Taking Active   amLODipine (NORVASC) 5 MG tablet [id number] Yes Take 1 tablet (5 mg total) by  mouth daily. Colon Branch, MD Taking Active   budesonide-formoterol [facility]) 160-4.5 MCG/ACT inhaler [id number]  Inhale 2 puffs into the lungs in the morning and at bedtime. Colon Branch, MD  Active   cyclobenzaprine (FLEXERIL) 5 MG tablet [id number] Yes Take 1 tablet (5 mg total) by mouth 2 (two) times daily as needed for muscle spasms. Colon Branch, MD Taking Active            Med Note [facility], Cheri Rous   Fri Feb 26, 2022 10:39 AM) PRN  ELIQUIS 5 MG TABS tablet [id number] Yes TAKE 1 TABLET TWICE DAILY Colon Branch, MD Taking Active   fluticasone [facility]) 50 MCG/ACT nasal spray [id number] Yes Place 2 sprays into both nostrils daily as needed for allergies or rhinitis. [name], Nelda Bucks, NP Taking Active Self  guaiFENesin (MUCINEX) 600 MG 12 hr tablet [id number] Yes Take 2 tablets (1,200 mg total) by mouth 2 (two) times daily. Aline August, MD Taking Active   hydrochlorothiazide (HYDRODIURIL) 25 MG tablet [id number] Yes Take 0.5 tablets (12.5 mg total) by mouth daily. Colon Branch, MD Taking Active   levothyroxine (SYNTHROID) 75 MCG tablet [id number] Yes Take 1 tablet (75 mcg total) by mouth daily before breakfast. Colon Branch,  MD Taking Active   loperamide (IMODIUM) 2 MG capsule [id number] Yes Take 1 capsule (2 mg total) by mouth daily as needed for diarrhea or loose stools. Colon Branch, MD Taking Active            Med Note Antony Contras, Ina Mar 30, 2022  1:35 PM)    Multiple Vitamins-Minerals (PRESERVISION AREDS) TABS [id number] Yes Take 1 tablet by mouth in the morning and at bedtime. [provider] Taking Active Self  potassium chloride Rhetta Mura) 10 MEQ tablet [id number] Yes TAKE 1 TABLET EVERY DAY Colon Branch, MD Taking Active   Med List Note Corky Mull, CPhT 05/08/20 1761): [place]            Patient Active Problem List   Diagnosis Date Noted   COPD (chronic obstructive pulmonary disease) ([place]) 02/26/2022   Lung mass 11/08/2021   Acute hypoxemic respiratory failure ([place]) 11/08/2021   Acute respiratory failure with hypoxia (HCC) 11/07/2021   Cough 11/07/2021   Chronic diarrhea 11/07/2021   Solitary pulmonary nodule 07/09/2020   Abnormal findings on diagnostic imaging of lung 05/30/2020   Acute deep vein thrombosis (DVT) of proximal vein of both lower extremities ([place]) 05/30/2020   VTE (venous thromboembolism) 05/30/2020   Goals of care, counseling/discussion    Palliative care by specialist    DNR (do not resuscitate) discussion    Intussusception ([place]) 04/29/2020   Pulmonary embolism, bilateral ([place]) 04/29/2020   Hypoxia 04/29/2020   Pre-ulcerative calluses 01/25/2020   Essential hypertension 11/29/2016   Closed fracture of multiple pubic rami ([place]) 11/29/2016   Fall    Closed fracture of left pubis ([place]) 11/28/2016   Hypokalemia 11/28/2016   Hearing loss 09/19/2015   PCP NOTES >>> 05/13/2015   Allergic rhinitis 10/24/2014   Annual physical exam 04/14/2011   BACK PAIN, shoulder pain 12/11/2007   Osteoporosis 10/20/2007   NEOPLASM, MALIGNANT, BREAST, HX OF 10/20/2007   Hypothyroidism 01/30/2007   Hyperlipidemia 01/30/2007   HTN  01/30/2007    OSTEOARTHRITIS 01/30/2007    Immunization History  Administered Date(s) Administered   Fluad Quad(high Dose 65+) 06/02/2020   H1N1 08/29/2008   Influenza Whole 05/12/2010   Influenza, High Dose Seasonal  PF 06/07/2013, 05/13/2015, 07/20/2016, 04/20/2017, 05/25/2018, 05/28/2019   Influenza,inj,Quad PF,6+ Mos 06/10/2014   Influenza-Unspecified 06/01/2021   PFIZER(Purple Top)SARS-COV-2 Vaccination 09/30/2019, 10/24/2019, 07/31/2020   PPD Test 12/14/2016   Pneumococcal Conjugate-13 06/10/2014   Pneumococcal Polysaccharide-23 07/10/2003, 02/21/2008, 11/23/2017   Td 05/16/2007, 05/25/2018   Zoster Recombinat (Shingrix) 10/12/2018, 06/28/2019   Zoster, Live 12/13/2012    Conditions to be addressed/monitored: HTN, Hypothyroidism, Osteoporosis, and history of PE on long term anticoagulation; history of breast cancer; Lung cancer.   Care Plan : General Pharmacy (Adult)  Updates made by Cherre Robins, RPH-CPP since 03/30/2022 12:00 AM     Problem: Hypertension; osteoporosis; Edema; hypothyroidism, lung cancer; history of breast cancer; history of pulmonary embolism with long term anticoagulation      Long-Range Goal: Provide education, support and care coordination for medication therapy and chronic conditions   Start Date: 06/30/2021  Recent Progress: On track  Priority: High  Note:   Current Barriers:  Does not adhere to prescribed medication regimen Does not maintain contact with provider office Need for transportation assistance  - family providing transportation  Pharmacist Clinical Goal(s):  Over the next 90 days, patient will maintain control of hypertension  as evidenced by blood pressure <140/90  adhere to prescribed medication regimen as evidenced by getting Prolia injection and refilling medications on time Increase adherence for potassium supplement  through collaboration with PharmD and provider.  Provide transportation support and resources (patient declined 06/30/2021)    Interventions: 1:1 collaboration with Colon Branch, MD regarding development and update of comprehensive plan of care as evidenced by provider attestation and co-signature Inter-disciplinary care team collaboration (see longitudinal plan of care) Comprehensive medication review performed; medication list updated in electronic medical record  Hypertension / Edema / Low potassium: Controlled; blood pressure goal < 140/90 Current treatment: Hydrochlorothiazide 61m - take 0.5 tablets = 12.533meach morning Amlodipine 6m33m take 1 tablet daily Potassium 10 mEq daily   Current home readings: home blood pressure has been 120 to 130 / 70 to 85  BP Readings from Last 3 Encounters:  02/26/22 126/82  01/07/22 118/66  12/03/21 116/68  Denies hypotensive/hypertensive symptoms Interventions:  Reminded patient to take potassium 5m9m 1 tablet daily.  Recheck BMP / potassium at next office visit with Dr Paz.Larose KellsContinue current medications for blood pressure    Hypothyroidism: Last TSH was in therapeutic range Current therapy:  Levothyroxine 76mc46mily  Interventions:  Continue current therapy  COPD / lung mass:  [facility]  Current therapy:  Symbicort 160/46mcg36mnhaler 2 puffs into lungs twice a day (maintenance inhaler)  Albuterol inhaler - inhale 2 puffs every 6 hours as needed for shortness of breath or wheezing (rescue inhaler)  Interventions:  Inhaler education provided.  [facility]   SDOH:  Discussed medication cost - patient states no issues with medication costs but she is only taking Preservision once a day due to cost of over-the-counter Preservision. She reports today she is so thankful that she now knows about [employer] $50 per quarter over-the-counter allowance.  Patient has had to cancel appointments due to loaning her grandson her car. Currently family has been providing  transportation to appointments.  Interventions:  Education provided regarding Human over-the-counter benefits. [facility] that patient gets $50 per quarter for over-the-counter products ordered thru [facility] Cressed at previous visit)  Mailed patient copy of [employer] over-the-counter product catalog  for 2023. (At last visit) Today assisted patient in ordering AREDs vitamins from [facility] over-the-counter with her benefits (has $25 left for quarter and AREDS cost $25) Discussed referral for [facility] services - patient declined. (Addressed at previous visit)   Patient Goals/Self-Care Activities Over the next 90 days, patient will:  take medications as prescribed,  Consider using [facility] transportation services for [facility] facility appointment (call me if you decide to try transportation service - Cherre Robins [phone]) Take potassium supplement 38mg - 1 tablet daily. If you have difficulty swallowing you can try dissolving potassium in water and drink mixture for easier administration.  Use Symbicort inhaler (maintenance inhaler) every day - 2 inhalations each morning (rinse mouth after use) and 2 inhalations each evening (rinse mouth after use). You should received additional inhalers from [facility] the next 2 weeks.  Use albuterol as needed (rescue inhaler) when you are short of breath or wheezing - inhale 2 puffs up to every 6 hours if needed.   Follow Up Plan: Telephone follow up appointment with care management team member scheduled for:  2 months          Medication Assistance:  Education provided about OTC benefits. Catalog mailed and helped patient with first order. She has $50 per quarter to use for over-the-counter products.   [place] [id number]: [phone]: [phone] [id number]: [phone]: [phone]  Follow Up:  Patient agrees to Care Plan and Follow-up.  Plan: Telephone follow up appointment with care management team member scheduled for:  October 2023  TCherre Robins PharmD Clinical Pharmacist [facility]

## 2022-03-30 NOTE — Patient Instructions (Signed)
Ms Ariel Medina,  It was a pleasure speaking with you today.  Below is a summary of your health goals and summary of our recent visit. You can also view your updated Chronic Care Management Care plan through your MyChart account.    Patient Goals/Self-Care Activities Over the next 90 days, patient will:  take medications as prescribed,  Consider using [facility] services for [facility] facility appointment (call me if you decide to try transportation service - Cherre Robins [phone]) Take potassium supplement 64mEg - 1 tablet daily. If you have difficulty swallowing you can try dissolving potassium in water and drink mixture for easier administration.  Use Symbicort inhaler (maintenance inhaler) every day - 2 inhalations each morning (rinse mouth after use) and 2 inhalations each evening (rinse mouth after use). You should received additional inhalers from [place] in the next 2 weeks.  Use albuterol as needed (rescue inhaler) when you are short of breath or wheezing - inhale 2 puffs up to every 6 hours if needed.    As always if you have any questions or concerns especially regarding medications, please feel free to contact me either at the phone number below or with a MyChart message.   Keep up the good work!  Cherre Robins, PharmD Clinical Pharmacist [facility] [place] [phone] (direct line)  [phone] (main office number)   Patient verbalizes understanding of instructions and care plan provided today and agrees to view in [place]. Active MyChart status and patient understanding of how to access instructions and care plan via MyChart confirmed with patient.

## 2022-04-08 DIAGNOSIS — J449 Chronic obstructive pulmonary disease, unspecified: Secondary | ICD-10-CM

## 2022-04-08 DIAGNOSIS — I10 Essential (primary) hypertension: Secondary | ICD-10-CM | POA: Diagnosis not present

## 2022-04-08 DIAGNOSIS — E039 Hypothyroidism, unspecified: Secondary | ICD-10-CM | POA: Diagnosis not present

## 2022-04-18 ENCOUNTER — Other Ambulatory Visit: Payer: Self-pay | Admitting: Internal Medicine

## 2022-04-18 DIAGNOSIS — J9601 Acute respiratory failure with hypoxia: Secondary | ICD-10-CM | POA: Diagnosis not present

## 2022-04-18 DIAGNOSIS — R918 Other nonspecific abnormal finding of lung field: Secondary | ICD-10-CM | POA: Diagnosis not present

## 2022-04-18 DIAGNOSIS — E039 Hypothyroidism, unspecified: Secondary | ICD-10-CM

## 2022-04-18 DIAGNOSIS — E871 Hypo-osmolality and hyponatremia: Secondary | ICD-10-CM | POA: Diagnosis not present

## 2022-05-06 ENCOUNTER — Other Ambulatory Visit: Payer: Self-pay | Admitting: Internal Medicine

## 2022-05-12 ENCOUNTER — Encounter: Payer: Self-pay | Admitting: Podiatry

## 2022-05-12 ENCOUNTER — Ambulatory Visit: Payer: Medicare HMO | Admitting: Podiatry

## 2022-05-12 DIAGNOSIS — B351 Tinea unguium: Secondary | ICD-10-CM

## 2022-05-12 DIAGNOSIS — M79676 Pain in unspecified toe(s): Secondary | ICD-10-CM

## 2022-05-12 DIAGNOSIS — Q828 Other specified congenital malformations of skin: Secondary | ICD-10-CM

## 2022-05-12 DIAGNOSIS — L84 Corns and callosities: Secondary | ICD-10-CM

## 2022-05-12 NOTE — Progress Notes (Signed)
This patient returns to the office for evaluation and treatment of long thick painful nails .  This patient is unable to trim her own nails since the patient cannot reach the feet.  Patient says the nails are painful walking and wearing her shoes.  She has painful callus on the outside of her right foot.    She  returns for preventive foot care services.   General Appearance  Alert, conversant and in no acute stress.  Vascular  Dorsalis pedis and posterior tibial  pulses are weakly  palpable  bilaterally.  Capillary return is within normal limits  bilaterally. Temperature is within normal limits  bilaterally.  Neurologic  Senn-Weinstein monofilament wire test within normal limits  bilaterally. Muscle power within normal limits bilaterally.  Nails Thick disfigured discolored nails with subungual debris  from hallux to fifth toes bilaterally. No evidence of bacterial infection or drainage bilaterally.  Orthopedic  No limitations of motion  feet .  No crepitus or effusions noted.  No bony pathology or digital deformities noted. HAV  B/L  Skin  normotropic skin  noted bilaterally.  No signs of infections or ulcers noted.   Porokeratosis sub 5th met B/L.  Callus secondary HAV  B/L.  Onychomycosis  Pain in toes right foot  Pain in toes left foot  Porokeratosis sub 5th met B/L.  Debridement  of nails  1-5  B/L with a nail nipper.  Nails were then filed using a dremel tool with no incidents.  Debride porokeratosis with  # 15 blade and dremel tool     RTC 10  weeks   Gardiner Barefoot DPM

## 2022-05-25 ENCOUNTER — Ambulatory Visit: Payer: Medicare HMO | Admitting: Pharmacist

## 2022-05-25 DIAGNOSIS — J449 Chronic obstructive pulmonary disease, unspecified: Secondary | ICD-10-CM

## 2022-05-25 DIAGNOSIS — I10 Essential (primary) hypertension: Secondary | ICD-10-CM

## 2022-05-25 DIAGNOSIS — E039 Hypothyroidism, unspecified: Secondary | ICD-10-CM

## 2022-05-25 DIAGNOSIS — Z79899 Other long term (current) drug therapy: Secondary | ICD-10-CM

## 2022-05-25 DIAGNOSIS — I2699 Other pulmonary embolism without acute cor pulmonale: Secondary | ICD-10-CM

## 2022-05-25 NOTE — Progress Notes (Signed)
Pharmacy Note  05/25/2022 Name: Ariel Medina MRN: [id number] DOB: February 06, 1928  Subjective: Ariel Medina is a 88 y.o. year old female who is a primary care patient of Colon Branch, MD. Clinical Pharmacist Practitioner referral was placed to assist with medication management.    Engaged with patient by telephone for follow up visit today.  COPD: patient is using Symbicort 2 puffs twice a day. States she has been able to use her supplemental oxygen and rescue albuterol inhaler less lately - uses less than 2 times per week. She is checking pulse ox and report home O2 level was been > 90% the last 2 months.  Hypertension: Home and office blood pressure has been at goal. Currently taking hydrochlorothiazide 12.5mg  daily. Blood pressure has been 120 to 135 / 70's (checking about once per week) Hypokalemia: patient is taking potassium 10 mEq once daily. Last potassium was 3.9 on 11/27/2021 Due to recheck when sees PCP next month.  History of bilateral PE and lung nodule with hypermetabolism. She under went SBRT for 3 treatments to lung lesions. No biopsy has been completed due to risk of age and anticoagulation. Taking Eliquis 5mg  twice a day. Also has history of GI bleed. She has not followed up with pulmonology due to concerns with cost.  Medication maintenance: patient reports medications are affordable. Most copays are $0. Getting medications from [place] mail order now and this has been a big help because patient does not drive much. Continues to use her over-the-counter benefits of $50 per quarter for AREDS vitamins.    Objective: Review of patient status, including review of consultants reports, laboratory and other test data, was performed as part of comprehensive evaluation and provision of chronic care management services.   Lab Results  Component Value Date   CREATININE 0.55 11/27/2021   CREATININE 0.68 11/13/2021   CREATININE 0.68 11/12/2021    No results found for:  "HGBA1C"     Component Value Date/Time   CHOL 248 (H) 06/26/2019 0954   TRIG 96.0 06/26/2019 0954   TRIG 77 06/14/2006 0824   HDL 60.60 06/26/2019 0954   CHOLHDL 4 06/26/2019 0954   VLDL 19.2 06/26/2019 0954   LDLCALC 169 (H) 06/26/2019 0954   LDLDIRECT 150.2 06/07/2013 0907     Clinical ASCVD: Yes  The ASCVD Risk score (Arnett DK, et al., 2019) failed to calculate for the following reasons:   The 2019 ASCVD risk score is only valid for ages 53 to 64    BP Readings from Last 3 Encounters:  02/26/22 126/82  01/07/22 118/66  12/03/21 116/68     Allergies  Allergen Reactions   Ibuprofen     REACTION: bleed    Medications Reviewed Today     Reviewed by Cherre Robins, RPH-CPP (Pharmacist) on 05/25/22 at 1410  Med List Status: <None>   Medication Order Taking? Sig Documenting Provider Last Dose Status Informant  albuterol (VENTOLIN HFA) 108 (90 Base) MCG/ACT inhaler [id number] Yes Inhale 2 puffs into the lungs every 6 (six) hours as needed for wheezing or shortness of breath. Aline August, MD Taking Active   amLODipine (NORVASC) 5 MG tablet [id number] Yes Take 1 tablet (5 mg total) by mouth daily. Colon Branch, MD Taking Active   budesonide-formoterol [facility]) 160-4.5 MCG/ACT inhaler [id number] Yes Inhale 2 puffs into the lungs in the morning and at bedtime. Colon Branch, MD Taking Active   cyclobenzaprine (FLEXERIL) 5 MG tablet [id number] No Take 1 tablet (5 mg total)  by mouth 2 (two) times daily as needed for muscle spasms.  Patient not taking: Reported on 05/25/2022   Colon Branch, MD Not Taking Active            Med Note [facility], Cheri Rous   Fri Feb 26, 2022 10:39 AM) PRN  ELIQUIS 5 MG TABS tablet [id number] Yes TAKE 1 TABLET TWICE DAILY Colon Branch, MD Taking Active   fluticasone [facility]) 50 MCG/ACT nasal spray [id number] Yes Place 2 sprays into both nostrils daily as needed for allergies or rhinitis. [name], Nelda Bucks, NP Taking Active Self  Patient not taking:  Discontinued  05/25/22 1410 (Patient Preference)   hydrochlorothiazide (HYDRODIURIL) 25 MG tablet [id number] Yes Take 0.5 tablets (12.5 mg total) by mouth daily. Colon Branch, MD Taking Active   levothyroxine (SYNTHROID) 75 MCG tablet [id number] Yes Take 1 tablet (75 mcg total) by mouth daily before breakfast. Colon Branch, MD Taking Active   loperamide (IMODIUM) 2 MG capsule [id number] Yes Take 1 capsule (2 mg total) by mouth daily as needed for diarrhea or loose stools. Colon Branch, MD Taking Active            Med Note Antony Contras, [place] Mar 30, 2022  1:35 PM)    Multiple Vitamins-Minerals (PRESERVISION AREDS) TABS [id number] Yes Take 1 tablet by mouth in the morning and at bedtime. [provider] Taking Active Self  potassium chloride Rhetta Mura) 10 MEQ tablet [id number] Yes TAKE 1 TABLET EVERY DAY Colon Branch, MD Taking Active   Med List Note Corky Mull, CPhT 05/08/20 6629): [place]            Patient Active Problem List   Diagnosis Date Noted   COPD (chronic obstructive pulmonary disease) ([place]) 02/26/2022   Lung mass 11/08/2021   Acute hypoxemic respiratory failure ([place]) 11/08/2021   Acute respiratory failure with hypoxia (HCC) 11/07/2021   Cough 11/07/2021   Chronic diarrhea 11/07/2021   Solitary pulmonary nodule 07/09/2020   Abnormal findings on diagnostic imaging of lung 05/30/2020   Acute deep vein thrombosis (DVT) of proximal vein of both lower extremities ([place]) 05/30/2020   VTE (venous thromboembolism) 05/30/2020   Goals of care, counseling/discussion    Palliative care by specialist    DNR (do not resuscitate) discussion    Intussusception ([place]) 04/29/2020   Pulmonary embolism, bilateral ([place]) 04/29/2020   Hypoxia 04/29/2020   Pre-ulcerative calluses 01/25/2020   Essential hypertension 11/29/2016   Closed fracture of multiple pubic rami ([place]) 11/29/2016   Fall    Closed fracture of left pubis ([place]) 11/28/2016   Hypokalemia 11/28/2016   Hearing loss  09/19/2015   PCP NOTES >>> 05/13/2015   Allergic rhinitis 10/24/2014   Annual physical exam 04/14/2011   BACK PAIN, shoulder pain 12/11/2007   Osteoporosis 10/20/2007   NEOPLASM, MALIGNANT, BREAST, HX OF 10/20/2007   Hypothyroidism 01/30/2007   Hyperlipidemia 01/30/2007   HTN  01/30/2007   OSTEOARTHRITIS 01/30/2007     Medication Assistance:  None required.  Patient affirms current coverage meets needs.   Assessment / Plan: COPD: Controlled Continue Symbicort 2 puffs twice a day.  Continue to use supplemental O2 and albuterol as needed.  Continue to check O2 sats at home - contact office of < 90% Hypertension: Controlled Continue to check blood pressure at home 2 ro 3 times per week.  Continue hydrochlorothiazide 12.5mg  daily.  Hypokalemia:  Continue potassium 10 mEq once daily.  Due to recheck potassium  in November at PCP visit.  History of bilateral PE and lung nodule with hypermetabolism.  Encouraged patient to follow up with pulmonologist.  Could consider lower dose of Eliquis of 2.5mg  twice a day give age, weight and history of GI bleed. She is more than 6 months post PE but she also still might have active malignancy. Will consult with PCP.  Health maintenance:  Patient has received flu vaccine. Contacted [facility] to get information about which vaccine she received and when. Patientreceived Fluvad- high dose flu vaccine 05/19/2022 -updated vaccine records.   Follow Up:  Telephone follow up appointment with care management team member scheduled for:  2 to 3 months   Cherre Robins, PharmD Clinical Pharmacist [place] [phone]

## 2022-05-28 ENCOUNTER — Telehealth: Payer: Self-pay | Admitting: Pharmacist

## 2022-05-28 MED ORDER — APIXABAN 2.5 MG PO TABS: 2.5000 mg | ORAL_TABLET | Freq: Two times a day (BID) | ORAL | 1 refills | Status: DC

## 2022-05-28 NOTE — Telephone Encounter (Signed)
Patient notified of dose change. Updated Rx for Eliquis 2.5mg  twice a day sent to [facility] - patient should receive new dose in about 5 days.

## 2022-05-28 NOTE — Telephone Encounter (Signed)
-----   Message from Gayland Curry, DO sent at 05/28/2022 30:73 AM EDT ----- Certainly, I agree with decreasing her eliquis.  Thank you, Brietta Manso and Dr. [name] ----- Message ----- From: Colon Branch, MD Sent: 05/27/2022  11:43 AM EDT To: Gayland Curry, DO; Cherre Robins, RPH-CPP; #  Her PE DVT was in 2021 however she is at a ongoing risk of clots due to malignancy. I think it would be reasonable to decrease her dose of Eliquis. I will copy Dr. Mariea Clonts, to see if palliative care agrees. Thank you  JP  ----- Message ----- From: Cherre Robins, RPH-CPP Sent: 05/25/2022   2:48 PM EDT To: Colon Branch, MD  History of bilateral PE and lung nodule with hypermetabolism.  Encouraged patient to follow up with pulmonologist she states she cancelled last appointment due to cost (I appears she owes $200+ for past imaging / scans)  She is taking Eliquis 5mg  twice a day Could consider lower dose of Eliquis of 2.5mg  twice a day give age, weight and history of GI bleed. She is more than 6 months post PE but she also still might have active malignancy. What are your thoughts? Continue Eliquis 5mg  twice a day and have her f/u with pulmonology?

## 2022-06-14 ENCOUNTER — Other Ambulatory Visit: Payer: Self-pay | Admitting: Internal Medicine

## 2022-06-29 ENCOUNTER — Ambulatory Visit: Payer: Medicare HMO | Admitting: Internal Medicine

## 2022-07-05 ENCOUNTER — Telehealth: Payer: Self-pay

## 2022-07-05 NOTE — Telephone Encounter (Signed)
Nurse Assessment Nurse: Mancel Bale, RN, Traci Date/Time Eilene Ghazi Time): 07/02/2022 9:12:59 AM Confirm and document reason for call. If symptomatic, describe symptoms. ---Caller states she is having neck pain and has swelling on right side of neck. States began on wednesday. States did not injure neck. Denies fever. Does the patient have any new or worsening symptoms? ---Yes Will a triage be completed? ---Yes Related visit to physician within the last 2 weeks? ---No Does the PT have any chronic conditions? (i.e. diabetes, asthma, this includes High risk factors for pregnancy, etc.) ---Yes List chronic conditions. ---lung cancer Is this a behavioral health or substance abuse call? ---No Guidelines Guideline Title Affirmed Question Affirmed Notes Nurse Date/Time (Eastern Time) Lymph Nodes - Swollen [1] Very tender to the touch AND [2] no fever Mancel Bale, RN, Traci 07/02/2022 9:14:47 AM Disp. Time Eilene Ghazi Time) Disposition Final User 07/02/2022 9:19:07 AM SEE PCP WITHIN 3 DAYS Yes Mancel Bale, RN, Traci Final Disposition 07/02/2022 9:19:07 AM SEE PCP WITHIN 3 DAYS Yes Mancel Bale, RN, Traci PLEASE NOTE: All timestamps contained within this report are represented as [place] Standard Time. CONFIDENTIALTY NOTICE: This fax transmission is intended only for the addressee. It contains information that is legally privileged, confidential or otherwise protected from use or disclosure. If you are not the intended recipient, you are strictly prohibited from reviewing, disclosing, copying using or disseminating any of this information or taking any action in reliance on or regarding this information. If you have received this fax in error, please notify us immediately by telephone so that we can arrange for its return to [place]. Phone: [phone], Toll-Free: [phone], Fax: [phone] Page: 2 of 2 Call Id: [id number] Caller Disagree/Comply Comply Caller Understands Yes PreDisposition Call Doctor Care  Advice Given Per Guideline SEE PCP WITHIN 3 DAYS: * You need to be seen within 2 or 3 days. * PCP VISIT: Call your doctor (or NP/PA) during regular office hours and make an appointment. A clinic or urgent care center are good places to go for care if your doctor's office is closed or you can't get an appointment. NOTE: If office will be open tomorrow, tell caller to call then, not in 3 days. CALL BACK IF: * Node enlarges to over 1 inch (2.5 cm) in size * Fever occurs * Overlying skin becomes red * You become worse CARE ADVICE given per Lymph Nodes Swollen (Adult) guideline. * For pain relief, you can take either acetaminophen, ibuprofen, or naproxen. * Use the lowest amount of medicine that makes your pain better. Referrals REFERRED TO PCP OFFICE

## 2022-07-05 NOTE — Telephone Encounter (Signed)
Appt scheduled tomorrow.

## 2022-07-06 ENCOUNTER — Ambulatory Visit (INDEPENDENT_AMBULATORY_CARE_PROVIDER_SITE_OTHER): Payer: Medicare HMO | Admitting: Internal Medicine

## 2022-07-06 ENCOUNTER — Encounter: Payer: Self-pay | Admitting: Internal Medicine

## 2022-07-06 VITALS — BP 126/80 | HR 77 | Temp 97.7°F | Resp 18 | Ht 62.0 in | Wt 123.2 lb

## 2022-07-06 DIAGNOSIS — I10 Essential (primary) hypertension: Secondary | ICD-10-CM | POA: Diagnosis not present

## 2022-07-06 DIAGNOSIS — E039 Hypothyroidism, unspecified: Secondary | ICD-10-CM

## 2022-07-06 DIAGNOSIS — R221 Localized swelling, mass and lump, neck: Secondary | ICD-10-CM | POA: Diagnosis not present

## 2022-07-06 DIAGNOSIS — I8290 Acute embolism and thrombosis of unspecified vein: Secondary | ICD-10-CM

## 2022-07-06 LAB — BASIC METABOLIC PANEL
BUN: 11 mg/dL (ref 6–23)
CO2: 34 mEq/L — ABNORMAL HIGH (ref 19–32)
Calcium: 9.4 mg/dL (ref 8.4–10.5)
Chloride: 99 mEq/L (ref 96–112)
Creatinine, Ser: 0.64 mg/dL (ref 0.40–1.20)
GFR: 76.82 mL/min (ref >60.00)
Glucose, Bld: 78 mg/dL (ref 70–99)
Potassium: 4.2 mEq/L (ref 3.5–5.1)
Sodium: 141 mEq/L (ref 135–145)

## 2022-07-06 LAB — TSH: TSH: 4.36 u[IU]/mL (ref 0.35–5.50)

## 2022-07-06 NOTE — Progress Notes (Signed)
Subjective:    Patient ID: Ariel Medina, female    DOB: February 10, 1928, 88 y.o.   MRN: [id number]  DOS:  07/06/2022 Type of visit - description: Follow-up here with her sister  In general feeling well. Last week noted right-sided neck mass, it was tender to palpation.  The size has decreased gradually. Denies fever chills.  No recent URI, sore throat, runny nose. No dental or gum pain. No weight loss.  Otherwise she feels at baseline ,good compliance with medications. Denies nausea vomiting.  No diarrhea. No blood in the stools.  No recent falls.  Wt Readings from Last 3 Encounters:  07/06/22 123 lb 4 oz (55.9 kg)  02/26/22 124 lb 4 oz (56.4 kg)  01/07/22 126 lb (57.2 kg)    Review of Systems See above   Past Medical History:  Diagnosis Date   Anemia    Breast CA ([place])    left breast-invasive ductal ca stage 1-stopped arimidex 2008   COPD (chronic obstructive pulmonary disease) ([place]) 02/26/2022   Diverticulitis of colon    GI bleed 04/2020   Hiatal hernia    History of radiation therapy 08/05/2020   56 Gray in 3 fractions to the left upper lung nodule 07/29/2020-08/05/2020.   Dr Gery Pray   Hyperlipidemia    Hypertension    Hypothyroidism    Intussusception of intestine ([place]) 2021   Lung mass    Osteoarthritis    Osteopenia    DEXA 7/02   Pulmonary embolism (HCC)/DVT    Syncope 2005   CT head (-), ECHO essent. neg, stress test (-), carotid u/s (-)    Past Surgical History:  Procedure Laterality Date   BREAST BIOPSY Bilateral    BREAST LUMPECTOMY  04/2002   left   PARTIAL COLECTOMY N/A 05/01/2020   Procedure: PARTIAL COLECTOMY;  Surgeon: Coralie Keens, MD;  Location: [place] [facility];  Service: General;  Laterality: N/A;   TONSILLECTOMY AND ADENOIDECTOMY      Current Outpatient Medications  Medication Instructions   albuterol (VENTOLIN HFA) 108 (90 Base) MCG/ACT inhaler 2 puffs, Inhalation, Every 6 hours PRN   amLODipine (NORVASC) 5 mg, Oral, Daily   apixaban  (ELIQUIS) 2.5 mg, Oral, 2 times daily   budesonide-formoterol (SYMBICORT) 160-4.5 MCG/ACT inhaler 2 puffs, Inhalation, 2 times daily   cyclobenzaprine (FLEXERIL) 5 mg, Oral, 2 times daily PRN   fluticasone (FLONASE) 50 MCG/ACT nasal spray 2 sprays, Each Nare, Daily PRN   hydrochlorothiazide (HYDRODIURIL) 12.5 mg, Oral, Daily   levothyroxine (SYNTHROID) 75 mcg, Oral, Daily before breakfast   loperamide (IMODIUM) 2 mg, Oral, Daily PRN   Multiple Vitamins-Minerals (PRESERVISION AREDS) TABS 1 tablet, Oral, 2 times daily   potassium chloride (KLOR-CON M) 10 MEQ tablet TAKE 1 TABLET EVERY DAY       Objective:   Physical Exam HENT:     Head:     BP 126/80   Pulse 77   Temp 97.7 F (36.5 C) (Oral)   Resp 18   Ht 5\' 2"  (1.575 m)   Wt 123 lb 4 oz (55.9 kg)   SpO2 95%   BMI 22.54 kg/m  General:   Well developed, NAD, BMI noted. HEENT:  Normocephalic . Face symmetric, atraumatic Mouth: Floor of the mouth is soft without masses, ulcers or lesions.  She took her denture off, gum normal to inspection and palpation other than atrophy Lungs:  decreased breath sounds but clear Normal respiratory effort, no intercostal retractions, no accessory muscle use. Heart: RRR,  no murmur.  Lower extremities: no pretibial edema bilaterally  Skin: Not pale. Not jaundice Neurologic:  alert & oriented X3.  Speech normal, gait appropriate for age and assisted by a cane.   Psych--  Cognition and judgment appear intact.  Cooperative with normal attention span and concentration.  Behavior appropriate. No anxious or depressed appearing.      Assessment     Assessment  HTN Hyperlipidemia.  Off statins per patient choice, likes to minimize medicines Hypothyroidism Osteopenia:  --T score -2.1 (04-2012) DC Fosamax 12/2013 after 5 years --T score -1.5   (02-2015)  --T score -3.6 (april 2019) on Prolia DJD GERD  Tremors, likely essential H/o diverticulitis Breast cancer, L breast, release from  oncology 2013 Syncope 2005 11-2016: Fall, FX L pubic ramus, admitted then d/c to a NH, back home 12-18-16 Edema, R>L U/S (-)  for DVT 01/2017 Lung mass:No biopsy, suspected to be slow growing NSCLC ,s/p XRT (07-2020)  PLAN: HTN: BP seems to be very good, continue amlodipine, HCTZ, potassium.  Check BMP Hyperlipidemia: Off statins per patient choice Hypothyroidism: On Synthroid, check TSH Neck mass: Had a neck mass last week, it was tender, much smaller today.  On exam she has a submandibular lump possibly salivary gland.  Unlikely to be related with her history of lung cancer.  Recommend observation.  Will call if area swells up again. COPD: Stable DVT, bilateral PE 04-2020: On Eliquis.  Will consult with pulmonary via message.  Stop Eliquis? Preventive care: Had a flu shot, recommend a COVID booster RTC 4 m

## 2022-07-06 NOTE — Assessment & Plan Note (Signed)
HTN: BP seems to be very good, continue amlodipine, HCTZ, potassium.  Check BMP Hyperlipidemia: Off statins per patient choice Hypothyroidism: On Synthroid, check TSH Neck mass: Had a neck mass last week, it was tender, much smaller today.  On exam she has a submandibular lump possibly salivary gland.  Unlikely to be related with her history of lung cancer.  Recommend observation.  Will call if area swells up again. COPD: Stable DVT, bilateral PE 04-2020: On Eliquis.  Will consult with pulmonary via message.  Stop Eliquis? Preventive care: Had a flu shot, recommend a COVID booster RTC 4 m

## 2022-07-06 NOTE — Patient Instructions (Addendum)
Vaccines I recommend: Covid booster RSV vaccine  Continue checking the right side of your neck.  If the lump comes back let me know  Check the  blood pressure regularly BP GOAL is between 110/65 and  135/85. If it is consistently higher or lower, let me know    GO TO THE LAB : Get the blood work     [place], [place] back for a checkup in 4 months

## 2022-07-07 ENCOUNTER — Telehealth: Payer: Self-pay | Admitting: Internal Medicine

## 2022-07-07 NOTE — Telephone Encounter (Signed)
I communicated with pulmonary via message, okay to stop Eliquis from the pulmonary standpoint. Given age and risk of falls, I think risk outweighs benefits. Please call patient: Okay to stop Eliquis.

## 2022-07-08 NOTE — Telephone Encounter (Signed)
Tried calling Pt at home number- no answer, unable to leave VM. I called her granddaughter [name]- and [facility] informing her okay for Pt to d/c Eliquis, med list updated.

## 2022-07-08 NOTE — Telephone Encounter (Signed)
Pt called back- informed her to d/c Eliquis. Pt verbalized understanding

## 2022-07-18 ENCOUNTER — Other Ambulatory Visit: Payer: Self-pay | Admitting: Internal Medicine

## 2022-07-21 ENCOUNTER — Ambulatory Visit: Payer: Medicare HMO | Admitting: Podiatry

## 2022-08-04 ENCOUNTER — Ambulatory Visit (INDEPENDENT_AMBULATORY_CARE_PROVIDER_SITE_OTHER): Payer: Medicare HMO

## 2022-08-04 VITALS — Wt 123.0 lb

## 2022-08-04 DIAGNOSIS — Z0000 Encounter for general adult medical examination without abnormal findings: Secondary | ICD-10-CM | POA: Diagnosis not present

## 2022-08-04 NOTE — Progress Notes (Addendum)
I connected with  [name] on 08/04/22 by a audio enabled telemedicine application and verified that I am speaking with the correct person using two identifiers.  Patient Location: Home  Provider Location: Home Office  I discussed the limitations of evaluation and management by telemedicine. The patient expressed understanding and agreed to proceed.   Subjective:   Ariel Medina is a 88 y.o. female who presents for Medicare Annual (Subsequent) preventive examination.  Review of Systems     Cardiac Risk Factors include: advanced age (>9men, >80 women);hypertension;dyslipidemia     Objective:    Today's Vitals   08/04/22 0920  Weight: 123 lb (55.8 kg)   Body mass index is 22.5 kg/m.     08/04/2022    9:32 AM 11/08/2021    5:00 PM 11/07/2021    9:09 AM 06/10/2021   10:27 AM 04/20/2021   11:12 AM 03/09/2021   11:01 AM 07/09/2020    8:48 AM  Advanced Directives  Does Patient Have a Medical Advance Directive? Yes Yes Yes Yes Yes Yes Yes  Type of Paramedic of [place];Living will Living will Living will Living will Living will  Living will  Does patient want to make changes to medical advance directive? No - Patient declined No - Patient declined   No - Patient declined No - Patient declined No - Patient declined  Copy of [place] in Chart? Yes - validated most recent copy scanned in chart (See row information)          Current Medications (verified) Outpatient Encounter Medications as of 08/04/2022  Medication Sig   albuterol (VENTOLIN HFA) 108 (90 Base) MCG/ACT inhaler Inhale 2 puffs into the lungs every 6 (six) hours as needed for wheezing or shortness of breath.   amLODipine (NORVASC) 5 MG tablet Take 1 tablet (5 mg total) by mouth daily.   budesonide-formoterol (SYMBICORT) 160-4.5 MCG/ACT inhaler Inhale 2 puffs into the lungs 2 (two) times daily.   FLUAD QUADRIVALENT 0.5 ML injection    fluticasone (FLONASE) 50 MCG/ACT nasal  spray Place 2 sprays into both nostrils daily as needed for allergies or rhinitis.   hydrochlorothiazide (HYDRODIURIL) 25 MG tablet TAKE 1/2 TABLET EVERY DAY   levothyroxine (SYNTHROID) 75 MCG tablet Take 1 tablet (75 mcg total) by mouth daily before breakfast.   loperamide (IMODIUM) 2 MG capsule Take 1 capsule (2 mg total) by mouth daily as needed for diarrhea or loose stools.   Multiple Vitamins-Minerals (PRESERVISION AREDS) TABS Take 1 tablet by mouth in the morning and at bedtime.   potassium chloride (KLOR-CON M) 10 MEQ tablet TAKE 1 TABLET EVERY DAY   cyclobenzaprine (FLEXERIL) 5 MG tablet Take 1 tablet (5 mg total) by mouth 2 (two) times daily as needed for muscle spasms. (Patient not taking: Reported on 08/04/2022)   No facility-administered encounter medications on file as of 08/04/2022.    Allergies (verified) Ibuprofen   History: Past Medical History:  Diagnosis Date   Anemia    Breast CA ([place])    left breast-invasive ductal ca stage 1-stopped arimidex 2008   COPD (chronic obstructive pulmonary disease) ([place]) 02/26/2022   Diverticulitis of colon    GI bleed 04/2020   Hiatal hernia    History of radiation therapy 08/05/2020   54 Gray in 3 fractions to the left upper lung nodule 07/29/2020-08/05/2020.   Dr Gery Pray   Hyperlipidemia    Hypertension    Hypothyroidism    Intussusception of intestine ([place])  2021   Lung mass    Osteoarthritis    Osteopenia    DEXA 7/02   Pulmonary embolism (HCC)/DVT    Syncope 2005   CT head (-), ECHO essent. neg, stress test (-), carotid u/s (-)   Past Surgical History:  Procedure Laterality Date   BREAST BIOPSY Bilateral    BREAST LUMPECTOMY  04/2002   left   PARTIAL COLECTOMY N/A 05/01/2020   Procedure: PARTIAL COLECTOMY;  Surgeon: Coralie Keens, MD;  Location: [facility];  Service: General;  Laterality: N/A;   TONSILLECTOMY AND ADENOIDECTOMY     Family History  Problem Relation Age of Onset   Heart attack Brother 59   Diabetes  Mother    Stroke Father 88   Stroke Sister 48   Colon cancer Neg Hx    Breast cancer Neg Hx    Social History   Socioeconomic History   Marital status: Widowed    Spouse name: Not on file   Number of children: 2   Years of education: Not on file   Highest education level: Not on file  Occupational History   Occupation: n/a    Employer: RETIRED  Tobacco Use   Smoking status: Never   Smokeless tobacco: Never  Vaping Use   Vaping Use: Never used  Substance and Sexual Activity   Alcohol use: No   Drug use: No   Sexual activity: Not Currently  Other Topics Concern   Not on file  Social History Narrative   Lost a son 50   G-son  live w/ her from time to time, not permanently   [place] lives 15 min away    Sister live next door , does help the pt    G-son @ [employer] , G-daughter @ [employer] since 1993   still drives, short distances, typically not at night.   Social Determinants of Health   Financial Resource Strain: Low Risk  (08/04/2022)   Overall Financial Resource Strain (CARDIA)    Difficulty of Paying Living Expenses: Not very hard  Food Insecurity: No Food Insecurity (08/04/2022)   Hunger Vital Sign    Worried About Running Out of Food in the Last Year: Never true    Ran Out of Food in the Last Year: Never true  Transportation Needs: No Transportation Needs (08/04/2022)   PRAPARE - Hydrologist (Medical): No    Lack of Transportation (Non-Medical): No  Physical Activity: Insufficiently Active (08/04/2022)   Exercise Vital Sign    Days of Exercise per Week: 5 days    Minutes of Exercise per Session: 10 min  Stress: No Stress Concern Present (08/04/2022)   [place]    Feeling of Stress : Not at all  Social Connections: Moderately Isolated (08/04/2022)   Social Connection and Isolation Panel [NHANES]    Frequency of Communication with Friends  and Family: More than three times a week    Frequency of Social Gatherings with Friends and Family: More than three times a week    Attends Religious Services: More than 4 times per year    Active Member of [employer] or Organizations: No    Attends Archivist Meetings: Never    Marital Status: Widowed    Tobacco Counseling Counseling given: Not Answered   Clinical Intake:  Pre-visit preparation completed: Yes  Pain : No/denies pain     BMI - recorded: 22.5 Nutritional Status: BMI  of 19-24  Normal Nutritional Risks: None Diabetes: No  How often do you need to have someone help you when you read instructions, pamphlets, or other written materials from your doctor or pharmacy?: 1 - Never  Diabetic?no  Interpreter Needed?: No  Information entered by :: Charlott Rakes, LPN   Activities of Daily Living    08/04/2022    9:34 AM 11/08/2021    5:00 PM  In your present state of health, do you have any difficulty performing the following activities:  Hearing? 0 1  Vision? 0 0  Difficulty concentrating or making decisions? 0 0  Walking or climbing stairs? 0 1  Dressing or bathing? 0 1  Doing errands, shopping? 0 1  Preparing Food and eating ? N   Using the Toilet? N   In the past six months, have you accidently leaked urine? N   Do you have problems with loss of bowel control? N   Managing your Medications? N   Managing your Finances? N   Housekeeping or managing your Housekeeping? N     Patient Care Team: Colon Branch, MD as PCP - General Minus Breeding, MD as PCP - Cardiology (Cardiology) Thornell Sartorius, MD as Consulting Physician (Otolaryngology) Gardiner Barefoot, DPM as Consulting Physician (Podiatry) Cherre Robins, RPH-CPP (Pharmacist)  Indicate any recent Medical Services you may have received from other than [facility] providers in the past year (date may be approximate).     Assessment:   This is a routine wellness examination for  [place].  Hearing/Vision screen Hearing Screening - Comments:: Pt stated HOH Vision Screening - Comments:: Pt follows [facility] with [facility] for annual eye exams  Dietary issues and exercise activities discussed: Current Exercise Habits: Home exercise routine, Type of exercise: walking, Time (Minutes): 10, Frequency (Times/Week): 5, Weekly Exercise (Minutes/Week): 50   Goals Addressed             This Visit's Progress    Patient Stated       Get closer to the [place] and stay healthy       Depression Screen    08/04/2022    9:28 AM 02/26/2022   10:42 AM 06/10/2021   10:32 AM 09/02/2020   10:06 AM 03/03/2020    8:53 AM 03/03/2020    8:51 AM 06/26/2019    9:22 AM  PHQ 2/9 Scores  PHQ - 2 Score 0 0 0 0 0 0 0    Fall Risk    02/26/2022   10:41 AM 06/10/2021   10:29 AM 09/02/2020   10:06 AM 03/03/2020    8:51 AM 06/26/2019    9:22 AM  Fall Risk   Falls in the past year? 0 1 1 1  0  Number falls in past yr: 0 0 0 0   Injury with Fall? 0 0 0 0   Risk for fall due to :  History of fall(s)     Follow up Falls evaluation completed Falls prevention discussed  Education provided;Falls prevention discussed Falls evaluation completed    FALL RISK PREVENTION PERTAINING TO THE HOME:  Any stairs in or around the home? No  If so, are there any without handrails? No  Home free of loose throw rugs in walkways, pet beds, electrical cords, etc? Yes  Adequate lighting in your home to reduce risk of falls? Yes   ASSISTIVE DEVICES UTILIZED TO PREVENT FALLS:  Life alert? No  Use of a cane, walker or w/c? Yes  Grab bars in the bathroom? Yes  Shower chair or bench in shower? Yes  Elevated toilet seat or a handicapped toilet? Yes   TIMED UP AND GO:  Was the test performed? No .   Cognitive Function:        08/04/2022    9:36 AM 03/03/2020    8:51 AM  6CIT Screen  What Year? 0 points 0 points  What month? 0 points 0 points  What time? 0 points 0 points  Count back from 20 0 points 0  points  Months in reverse 0 points 0 points  Repeat phrase 0 points 0 points  Total Score 0 points 0 points    Immunizations Immunization History  Administered Date(s) Administered   Fluad Quad(high Dose 65+) 06/02/2020, 05/19/2022   H1N1 08/29/2008   Influenza Whole 05/12/2010   Influenza, High Dose Seasonal PF 06/07/2013, 05/13/2015, 07/20/2016, 04/20/2017, 05/25/2018, 05/28/2019   Influenza,inj,Quad PF,6+ Mos 06/10/2014   Influenza-Unspecified 06/01/2021   PFIZER(Purple Top)SARS-COV-2 Vaccination 09/30/2019, 10/24/2019, 07/31/2020   PPD Test 12/14/2016   Pneumococcal Conjugate-13 06/10/2014   Pneumococcal Polysaccharide-23 07/10/2003, 02/21/2008, 11/23/2017   Td 05/16/2007, 05/25/2018   Zoster Recombinat (Shingrix) 10/12/2018, 06/28/2019   Zoster, Live 12/13/2012    TDAP status: Up to date  Flu Vaccine status: Up to date  Pneumococcal vaccine status: Up to date  Covid-19 vaccine status: Completed vaccines  Qualifies for Shingles Vaccine? Yes   Zostavax completed Yes   Shingrix Completed?: Yes  Screening Tests Health Maintenance  Topic Date Due   COVID-19 Vaccine (4 - 2023-24 season) 04/09/2022   DTaP/Tdap/Td (3 - Tdap) 05/25/2028   Pneumonia Vaccine 33+ Years old  Completed   INFLUENZA VACCINE  Completed   DEXA SCAN  Completed   Zoster Vaccines- Shingrix  Completed   HPV VACCINES  Aged Out    Health Maintenance  Health Maintenance Due  Topic Date Due   COVID-19 Vaccine (4 - 2023-24 season) 04/09/2022    Colorectal cancer screening: No longer required.   Mammogram status: No longer required due to age .  Bone Density status: Completed 11/1717. Results reflect: Bone density results: OSTEOPOROSIS. Repeat every 2 years.   Additional Screening:   Vision Screening: Recommended annual ophthalmology exams for early detection of glaucoma and other disorders of the eye. Is the patient up to date with their annual eye exam?  Yes  Who is the provider or what  is the name of the office in which the patient attends annual eye exams? [facility] If pt is not established with a provider, would they like to be referred to a provider to establish care? No .   Dental Screening: Recommended annual dental exams for proper oral hygiene  Community Resource Referral / Chronic Care Management: CRR required this visit?  No   CCM required this visit?  No      Plan:     I have personally reviewed and noted the following in the patient's chart:   Medical and social history Use of alcohol, tobacco or illicit drugs  Current medications and supplements including opioid prescriptions. Patient is not currently taking opioid prescriptions. Functional ability and status Nutritional status Physical activity Advanced directives List of other physicians Hospitalizations, surgeries, and ER visits in previous 12 months Vitals Screenings to include cognitive, depression, and falls Referrals and appointments  In addition, I have reviewed and discussed with patient certain preventive protocols, quality metrics, and best practice recommendations. A written personalized care plan for preventive services as well as general preventive health recommendations were provided to patient.  Willette Brace, LPN   [id number]   Nurse Notes: none   I have reviewed and agree with [facility] documentation.  Kathlene November, MD

## 2022-08-04 NOTE — Patient Instructions (Signed)
Ariel Medina , Thank you for taking time to come for your Medicare Wellness Visit. I appreciate your ongoing commitment to your health goals. Please review the following plan we discussed and let me know if I can assist you in the future.   These are the goals we discussed:  Goals      Chronic Care Management Pharmacy Care Plan     CARE PLAN ENTRY (see longitudinal plan of care for additional care plan information)    Hypertension BP Readings from Last 3 Encounters:  02/26/22 126/82  01/07/22 118/66  12/03/21 116/68  Pharmacist Clinical Goal(s): Over the next 90 days, patient will work with PharmD and providers to maintain BP goal <140/90 Current regimen:  Hydrochlorothiazide  25mg  - take 0.5 tablet = 12.5mg  daily Potassium 10 mEq once a day Amlodipine 5mg  daily Patient self care activities - Over the next 90 days, patient will: Try dissolving one potassium tablet in 4 ounces water and drink mixture daily (may need to rinse cup with 2 more ounces of water to get all of potassium supplement)  Reschedule follow up with Dr [name] to check blood pressure - patient to rescheduled missed appointment as soon as she can arrange transportation.  Continue to take hydrochlorothiazide 25mg  one-half tablet = 12.5mg  daily and amlodipine 5mg  daily    Hypothyroidism: Last TSH was in therapeutic range Current therapy:  Levothyroxine 66mcg daily  Interventions:  Continue current therapy    COPD / lung disease:  Goal is to prevent hospitalization and improve breathing.  Current therapy:  Symbicort 160/61mcg - inhaler 2 puffs into lungs twice a day (maintenance inhaler)  Albuterol inhaler - inhale 2 puffs every 6 hours as needed for shortness of breath or wheezing (rescue inhaler)  Supplemental oxygen Interventions:  Inhaler education provided. Discussed maintenance versus rescue inhaler.  Assisted with requesting refill for Symbicort  Medication management Pharmacist Clinical Goal(s): Over the  next 90 days, patient will work with PharmD and providers to maintain optimal medication adherence Current pharmacy: [facility] Order Interventions Comprehensive medication review performed. Continue current medication management strategy Patient self care activities - Over the next 90 days, patient will: Focus on medication adherence by filling and taking medications appropriately  Take medications as prescribed Report any questions or concerns to PharmD and/or provider(s)  Patient Goals/Self-Care Activities Over the next 90 days, patient will:  take medications as prescribed,  Consider using [facility] services for [facility] appointment (call me if you decide to try transportation service - Cherre Robins [phone]) Take potassium supplement 38mEg - 1 tablet daily. If you have difficulty swallowing you can try dissolving potassium in water and drink mixture for easier administration.  Use Symbicort inhaler (maintenance inhaler) every day - 2 inhalations each morning (rinse mouth after use) and 2 inhalations each evening (rinse mouth after use). You should received additional inhalers from [facility] in the next 2 weeks.  Use albuterol as needed (rescue inhaler) when you are short of breath or wheezing - inhale 2 puffs up to every 6 hours if needed.     Please see past updates related to this goal by clicking on the "Past Updates" button in the selected goal       Maintain health of mind and body and maintain independence     Patient Stated     Get closer to the [facility] and stay healthy        This is a list of the screening recommended for you and due dates:  Health Maintenance  Topic Date Due   COVID-19 Vaccine (4 - 2023-24 season) 04/09/2022   DTaP/Tdap/Td vaccine (3 - Tdap) 05/25/2028   Pneumonia Vaccine  Completed   Flu Shot  Completed   DEXA scan (bone density measurement)  Completed   Zoster (Shingles) Vaccine  Completed   HPV Vaccine  Aged Out     Advanced directives: copies in chart   Conditions/risks identified: get closer to the [place] and stay Healthy   Next appointment: Follow up in one year for your annual wellness visit    Preventive Care 65 Years and Older, Female Preventive care refers to lifestyle choices and visits with your health care provider that can promote health and wellness. What does preventive care include? A yearly physical exam. This is also called an annual well check. Dental exams once or twice a year. Routine eye exams. Ask your health care provider how often you should have your eyes checked. Personal lifestyle choices, including: Daily care of your teeth and gums. Regular physical activity. Eating a healthy diet. Avoiding tobacco and drug use. Limiting alcohol use. Practicing safe sex. Taking low-dose aspirin every day. Taking vitamin and mineral supplements as recommended by your health care provider. What happens during an annual well check? The services and screenings done by your health care provider during your annual well check will depend on your age, overall health, lifestyle risk factors, and family history of disease. Counseling  Your health care provider may ask you questions about your: Alcohol use. Tobacco use. Drug use. Emotional well-being. Home and relationship well-being. Sexual activity. Eating habits. History of falls. Memory and ability to understand (cognition). Work and work Statistician. Reproductive health. Screening  You may have the following tests or measurements: Height, weight, and BMI. Blood pressure. Lipid and cholesterol levels. These may be checked every 5 years, or more frequently if you are over 51 years old. Skin check. Lung cancer screening. You may have this screening every year starting at age 2 if you have a 30-pack-year history of smoking and currently smoke or have quit within the past 15 years. Fecal occult blood test (FOBT) of the stool. You  may have this test every year starting at age 29. Flexible sigmoidoscopy or colonoscopy. You may have a sigmoidoscopy every 5 years or a colonoscopy every 10 years starting at age 14. Hepatitis C blood test. Hepatitis B blood test. Sexually transmitted disease (STD) testing. Diabetes screening. This is done by checking your blood sugar (glucose) after you have not eaten for a while (fasting). You may have this done every 1-3 years. Bone density scan. This is done to screen for osteoporosis. You may have this done starting at age 6. Mammogram. This may be done every 1-2 years. Talk to your health care provider about how often you should have regular mammograms. Talk with your health care provider about your test results, treatment options, and if necessary, the need for more tests. Vaccines  Your health care provider may recommend certain vaccines, such as: Influenza vaccine. This is recommended every year. Tetanus, diphtheria, and acellular pertussis (Tdap, Td) vaccine. You may need a Td booster every 10 years. Zoster vaccine. You may need this after age 65. Pneumococcal 13-valent conjugate (PCV13) vaccine. One dose is recommended after age 25. Pneumococcal polysaccharide (PPSV23) vaccine. One dose is recommended after age 67. Talk to your health care provider about which screenings and vaccines you need and how often you need them. This information is not intended to replace advice given  to you by your health care provider. Make sure you discuss any questions you have with your health care provider. Document Released: 08/22/2015 Document Revised: 04/14/2016 Document Reviewed: 05/27/2015 Elsevier Interactive Patient Education  2017 [place] Prevention in the Home Falls can cause injuries. They can happen to people of all ages. There are many things you can do to make your home safe and to help prevent falls. What can I do on the outside of my home? Regularly fix the edges of  walkways and driveways and fix any cracks. Remove anything that might make you trip as you walk through a door, such as a raised step or threshold. Trim any bushes or trees on the path to your home. Use bright outdoor lighting. Clear any walking paths of anything that might make someone trip, such as rocks or tools. Regularly check to see if handrails are loose or broken. Make sure that both sides of any steps have handrails. Any raised decks and porches should have guardrails on the edges. Have any leaves, snow, or ice cleared regularly. Use sand or salt on walking paths during winter. Clean up any spills in your garage right away. This includes oil or grease spills. What can I do in the bathroom? Use night lights. Install grab bars by the toilet and in the tub and shower. Do not use towel bars as grab bars. Use non-skid mats or decals in the tub or shower. If you need to sit down in the shower, use a plastic, non-slip stool. Keep the floor dry. Clean up any water that spills on the floor as soon as it happens. Remove soap buildup in the tub or shower regularly. Attach bath mats securely with double-sided non-slip rug tape. Do not have throw rugs and other things on the floor that can make you trip. What can I do in the bedroom? Use night lights. Make sure that you have a light by your bed that is easy to reach. Do not use any sheets or blankets that are too big for your bed. They should not hang down onto the floor. Have a firm chair that has side arms. You can use this for support while you get dressed. Do not have throw rugs and other things on the floor that can make you trip. What can I do in the kitchen? Clean up any spills right away. Avoid walking on wet floors. Keep items that you use a lot in easy-to-reach places. If you need to reach something above you, use a strong step stool that has a grab bar. Keep electrical cords out of the way. Do not use floor polish or wax that  makes floors slippery. If you must use wax, use non-skid floor wax. Do not have throw rugs and other things on the floor that can make you trip. What can I do with my stairs? Do not leave any items on the stairs. Make sure that there are handrails on both sides of the stairs and use them. Fix handrails that are broken or loose. Make sure that handrails are as long as the stairways. Check any carpeting to make sure that it is firmly attached to the stairs. Fix any carpet that is loose or worn. Avoid having throw rugs at the top or bottom of the stairs. If you do have throw rugs, attach them to the floor with carpet tape. Make sure that you have a light switch at the top of the stairs and the bottom of the  stairs. If you do not have them, ask someone to add them for you. What else can I do to help prevent falls? Wear shoes that: Do not have high heels. Have rubber bottoms. Are comfortable and fit you well. Are closed at the toe. Do not wear sandals. If you use a stepladder: Make sure that it is fully opened. Do not climb a closed stepladder. Make sure that both sides of the stepladder are locked into place. Ask someone to hold it for you, if possible. Clearly mark and make sure that you can see: Any grab bars or handrails. First and last steps. Where the edge of each step is. Use tools that help you move around (mobility aids) if they are needed. These include: Canes. Walkers. Scooters. Crutches. Turn on the lights when you go into a dark area. Replace any light bulbs as soon as they burn out. Set up your furniture so you have a clear path. Avoid moving your furniture around. If any of your floors are uneven, fix them. If there are any pets around you, be aware of where they are. Review your medicines with your doctor. Some medicines can make you feel dizzy. This can increase your chance of falling. Ask your doctor what other things that you can do to help prevent falls. This  information is not intended to replace advice given to you by your health care provider. Make sure you discuss any questions you have with your health care provider. Document Released: 05/22/2009 Document Revised: 01/01/2016 Document Reviewed: 08/30/2014 Elsevier Interactive Patient Education  2017 [employer].

## 2022-08-23 ENCOUNTER — Telehealth: Payer: Self-pay | Admitting: Pharmacist

## 2022-08-23 ENCOUNTER — Ambulatory Visit (INDEPENDENT_AMBULATORY_CARE_PROVIDER_SITE_OTHER): Payer: Medicare HMO | Admitting: Pharmacist

## 2022-08-23 DIAGNOSIS — J449 Chronic obstructive pulmonary disease, unspecified: Secondary | ICD-10-CM

## 2022-08-23 DIAGNOSIS — I10 Essential (primary) hypertension: Secondary | ICD-10-CM

## 2022-08-23 DIAGNOSIS — Z79899 Other long term (current) drug therapy: Secondary | ICD-10-CM

## 2022-08-23 MED ORDER — CYCLOBENZAPRINE HCL 5 MG PO TABS: 5.0000 mg | ORAL_TABLET | Freq: Two times a day (BID) | ORAL | 0 refills | Status: DC | PRN

## 2022-08-23 NOTE — Progress Notes (Signed)
08/23/2022  Patient ID: Ariel Medina, female   DOB: November 23, 1927, 89 y.o.   MRN: [id number]  Subjective: Ariel Medina is a 89 y.o. year old female who is a primary care patient of Colon Branch, MD. Clinical Pharmacist Practitioner referral was placed to assist with medication management.    Engaged with patient by telephone for follow up visit today.  COPD: patient is using Symbicort 2 puffs twice a day. She has not needed to use either her supplemental oxygen or rescue albuterol inhaler in the last 2 or 3 months. She is checking pulse ox and report home O2 level was been > 90% the last 2 months.  Today pulse Ox was 95%.  Hypertension: Office blood pressure has been at goal. She has been checking blood pressure a few times per week at home but has not been writing blood pressure readings down and coul dnot remember her last reading. Currently taking hydrochlorothiazide 25mg  every other day (states tablets are too small to take 0.5 tablet daily)  Hypokalemia: patient is taking potassium 10 mEq once daily. Last potassium was WNL at 4.2 (07/06/2022) History of bilateral PE and lung nodule with hypermetabolism. She under went SBRT for 3 treatments to lung lesions. No biopsy has been completed due to risk of age and anticoagulation.  Also has history of GI bleed. She was taking Eliquis 2.5mg  twice a day but was stopped in November due to risk of bleeding and falls.  Medication maintenance: patient reports medications are affordable. Most copays are $0. Getting medications from [facility] mail order now and this has been a big help because patient does not drive much. Continues to use her over-the-counter benefits of $50 per quarter for AREDS vitamins.   Patient states she cleaned her home over the weekend and thinks she may have overdone it. Having occasional back spasms. She has taken cyclobenzaprine 5mg  as needed in the past - last filled 02/04/2022 for #20 tablets.   Objective: Review of patient  status, including review of consultants reports, laboratory and other test data, was performed as part of comprehensive evaluation and provision of chronic care management services.   Lab Results  Component Value Date   CREATININE 0.64 07/06/2022   CREATININE 0.55 11/27/2021   CREATININE 0.68 11/13/2021    No results found for: "HGBA1C"     Component Value Date/Time   CHOL 248 (H) 06/26/2019 0954   TRIG 96.0 06/26/2019 0954   TRIG 77 06/14/2006 0824   HDL 60.60 06/26/2019 0954   CHOLHDL 4 06/26/2019 0954   VLDL 19.2 06/26/2019 0954   LDLCALC 169 (H) 06/26/2019 0954   LDLDIRECT 150.2 06/07/2013 0907     Clinical ASCVD: Yes  The ASCVD Risk score (Arnett DK, et al., 2019) failed to calculate for the following reasons:   The 2019 ASCVD risk score is only valid for ages 51 to 40    BP Readings from Last 3 Encounters:  07/06/22 126/80  02/26/22 126/82  01/07/22 118/66     Allergies  Allergen Reactions   Ibuprofen     REACTION: bleed    Medications Reviewed Today     Reviewed by Cherre Robins, RPH-CPP (Pharmacist) on 08/23/22 at [place] List Status: <None>   Medication Order Taking? Sig Documenting Provider Last Dose Status Informant  albuterol (VENTOLIN HFA) 108 (90 Base) MCG/ACT inhaler [id number] Yes Inhale 2 puffs into the lungs every 6 (six) hours as needed for wheezing or shortness of breath. Aline August, MD Taking  Active   amLODipine (NORVASC) 5 MG tablet [id number] Yes Take 1 tablet (5 mg total) by mouth daily. Colon Branch, MD Taking Active   budesonide-formoterol [facility]) 160-4.5 MCG/ACT inhaler [id number] Yes Inhale 2 puffs into the lungs 2 (two) times daily. Colon Branch, MD Taking Active   cyclobenzaprine (FLEXERIL) 5 MG tablet [id number] Yes Take 1 tablet (5 mg total) by mouth 2 (two) times daily as needed for muscle spasms. Colon Branch, MD Taking Active            Med Note (CANTER, Vilma Prader D   Fri Feb 26, 2022 10:39 AM) PRN  fluticasone Asencion Islam) 50  MCG/ACT nasal spray [id number] Yes Place 2 sprays into both nostrils daily as needed for allergies or rhinitis. [name], [name], NP Taking Active Self  hydrochlorothiazide (HYDRODIURIL) 25 MG tablet [id number] Yes TAKE 1/2 TABLET EVERY DAY  Patient taking differently: Take 25 mg by mouth every other day.   Dutch Quint B, FNP Taking Active   levothyroxine (SYNTHROID) 75 MCG tablet [id number] Yes Take 1 tablet (75 mcg total) by mouth daily before breakfast. Colon Branch, MD Taking Active   loperamide (IMODIUM) 2 MG capsule [id number] Yes Take 1 capsule (2 mg total) by mouth daily as needed for diarrhea or loose stools. Colon Branch, MD Taking Active            Med Note (CANTER, Vilma Prader D   Tue Jul 06, 2022  9:22 AM) PRN  Multiple Vitamins-Minerals (PRESERVISION AREDS) TABS [id number] Yes Take 1 tablet by mouth in the morning and at bedtime. [provider] Taking Active Self  potassium chloride Rhetta Mura) 10 MEQ tablet [id number] Yes TAKE 1 TABLET EVERY DAY Kennyth Arnold, FNP Taking Active   Med List Note Corky Mull, CPhT 05/08/20 1093): [place]            Patient Active Problem List   Diagnosis Date Noted   COPD (chronic obstructive pulmonary disease) ([place]) 02/26/2022   Lung mass 11/08/2021   Acute hypoxemic respiratory failure ([place]) 11/08/2021   Acute respiratory failure with hypoxia ([facility]) 11/07/2021   Cough 11/07/2021   Chronic diarrhea 11/07/2021   Solitary pulmonary nodule 07/09/2020   Abnormal findings on diagnostic imaging of lung 05/30/2020   Acute deep vein thrombosis (DVT) of proximal vein of both lower extremities ([place]) 05/30/2020   VTE (venous thromboembolism) 05/30/2020   Goals of care, counseling/discussion    Palliative care by specialist    DNR (do not resuscitate) discussion    Intussusception ([place]) 04/29/2020   Pulmonary embolism, bilateral ([place]) 04/29/2020   Hypoxia 04/29/2020   Pre-ulcerative calluses 01/25/2020   Essential  hypertension 11/29/2016   Closed fracture of multiple pubic rami ([place]) 11/29/2016   Fall    Closed fracture of left pubis ([place]) 11/28/2016   Hypokalemia 11/28/2016   Hearing loss 09/19/2015   PCP NOTES >>> 05/13/2015   Allergic rhinitis 10/24/2014   Annual physical exam 04/14/2011   BACK PAIN, shoulder pain 12/11/2007   Osteoporosis 10/20/2007   NEOPLASM, MALIGNANT, BREAST, HX OF 10/20/2007   Hypothyroidism 01/30/2007   Hyperlipidemia 01/30/2007   HTN  01/30/2007   OSTEOARTHRITIS 01/30/2007     Medication Assistance:  None required.  Patient affirms current coverage meets needs.   Assessment / Plan: COPD: Controlled Continue Symbicort 2 puffs twice a day.  Continue to use supplemental O2 and albuterol as needed.  Continue to check O2 sats at home - contact office of < 90% Hypertension:  Controlled Continue to check blood pressure at home 1 to 2 times per week. Recommended patient record blood pressure readings Continue hydrochlorothiazide 25mg  every other day and amlodipine 5mg  daily.  Hypokalemia:  Continue potassium 10 mEq once daily.  Muscle Spasms:  Sent message to PCP about cyclobenzaprine request. Patient is aware PCP might refill or might need to see her before prescribing treatment.   Follow Up:  Telephone follow up appointment with care management team member scheduled for:  4 to 5 months ; Will see PCP in April 2024.    Cherre Robins, PharmD Clinical Pharmacist [facility] [phone]

## 2022-08-23 NOTE — Patient Instructions (Signed)
Ms Owens Shark,  It was a pleasure speaking with you today.  Below is a summary of your health goals and summary of our recent visit.   Patient Goals/Self-Care Activities Over the next 90 days, patient will:  take medications as prescribed,  Use Symbicort inhaler (maintenance inhaler) every day - 2 inhalations each morning (rinse mouth after use) and 2 inhalations each evening (rinse mouth after use).  Use albuterol as needed (rescue inhaler) when you are short of breath or wheezing - inhale 2 puffs up to every 6 hours if needed.   Continue to check O2 sats at home - contact office of < 90% Continue to check blood pressure at home 1 to 2 times per week. Recommended patient record blood pressure readings. Goal blood pressure is 110 - 135 / 65-85 Continue hydrochlorothiazide 25mg  every other day and amlodipine 5mg  daily.  Muscle Spasms: Sent message to Dr [name] about cyclobenzaprine request.  As always if you have any questions or concerns especially regarding medications, please feel free to contact me either at the phone number below or with a MyChart message.   Keep up the good work!  Cherre Robins, PharmD Clinical Pharmacist [facility] [phone] (direct line)  [phone] (main office number)   Patient verbalizes understanding of instructions and care plan provided today and agrees to view in [facility]. Active MyChart status and patient understanding of how to access instructions and care plan via MyChart confirmed with patient.

## 2022-08-23 NOTE — Telephone Encounter (Signed)
I sent the prescription, please advise to stop it if she feels dizzy or drowsy.

## 2022-08-23 NOTE — Addendum Note (Signed)
Addended by: Willow Ora E on: 08/23/2022 03:55 PM   Modules accepted: Orders

## 2022-08-23 NOTE — Telephone Encounter (Signed)
Patient states she "overdid it" this weekend cleaning her house. In past she has taken cyclobenzaprine 5mg  when needed for muscle spasms. Last refilled for #20 tabs 02/04/2022.   She is requesting refill. Forwarded to PCP to review and send to pharmacy if he determines is appropriate.

## 2022-09-21 ENCOUNTER — Ambulatory Visit: Payer: Medicare HMO | Admitting: Podiatry

## 2022-09-27 ENCOUNTER — Encounter: Payer: Self-pay | Admitting: Podiatry

## 2022-09-27 ENCOUNTER — Ambulatory Visit: Payer: Medicare HMO | Admitting: Podiatry

## 2022-09-27 DIAGNOSIS — L84 Corns and callosities: Secondary | ICD-10-CM

## 2022-09-27 DIAGNOSIS — M79676 Pain in unspecified toe(s): Secondary | ICD-10-CM | POA: Diagnosis not present

## 2022-09-27 DIAGNOSIS — B351 Tinea unguium: Secondary | ICD-10-CM

## 2022-09-27 NOTE — Progress Notes (Signed)
This patient returns to the office for evaluation and treatment of long thick painful nails .  This patient is unable to trim her own nails since the patient cannot reach the feet.  Patient says the nails are painful walking and wearing her shoes.  She has painful callus on the outside of  both her feet..    She  returns for preventive foot care services.   General Appearance  Alert, conversant and in no acute stress.  Vascular  Dorsalis pedis and posterior tibial  pulses are weakly  palpable  bilaterally.  Capillary return is within normal limits  bilaterally. Temperature is within normal limits  bilaterally.  Neurologic  Senn-Weinstein monofilament wire test within normal limits  bilaterally. Muscle power within normal limits bilaterally.  Nails Thick disfigured discolored nails with subungual debris  from hallux to fifth toes bilaterally. No evidence of bacterial infection or drainage bilaterally.  Orthopedic  No limitations of motion  feet .  No crepitus or effusions noted.  No bony pathology or digital deformities noted. HAV  B/L  Skin  normotropic skin  noted bilaterally.  No signs of infections or ulcers noted.   Porokeratosis sub 5th met B/L.  Callus secondary HAV  B/L.  Onychomycosis  Pain in toes right foot  Pain in toes left foot  Porokeratosis sub 5th met B/L.  Debridement  of nails  1-5  B/L with a nail nipper.  Nails were then filed using a dremel tool with no incidents.  Debride porokeratosis with  # 15 blade and dremel tool     RTC 10  weeks   Gardiner Barefoot DPM

## 2022-09-27 NOTE — Addendum Note (Signed)
Addended by: Gardiner Barefoot on: 09/27/2022 11:57 AM   Modules accepted: Level of Service

## 2022-09-30 ENCOUNTER — Ambulatory Visit (INDEPENDENT_AMBULATORY_CARE_PROVIDER_SITE_OTHER): Payer: Medicare HMO | Admitting: Internal Medicine

## 2022-09-30 ENCOUNTER — Encounter: Payer: Self-pay | Admitting: Internal Medicine

## 2022-09-30 VITALS — BP 124/80 | HR 67 | Temp 97.6°F | Resp 18 | Ht 62.0 in | Wt 129.0 lb

## 2022-09-30 DIAGNOSIS — E039 Hypothyroidism, unspecified: Secondary | ICD-10-CM

## 2022-09-30 DIAGNOSIS — I10 Essential (primary) hypertension: Secondary | ICD-10-CM | POA: Diagnosis not present

## 2022-09-30 DIAGNOSIS — J449 Chronic obstructive pulmonary disease, unspecified: Secondary | ICD-10-CM | POA: Diagnosis not present

## 2022-09-30 LAB — CBC WITH DIFFERENTIAL/PLATELET
Basophils Absolute: 0 10*3/uL (ref 0.0–0.1)
Basophils Relative: 0.6 % (ref 0.0–3.0)
Eosinophils Absolute: 0.1 10*3/uL (ref 0.0–0.7)
Eosinophils Relative: 0.9 % (ref 0.0–5.0)
HCT: 43.1 % (ref 36.0–46.0)
Hemoglobin: 14.2 g/dL (ref 12.0–15.0)
Lymphocytes Relative: 24.1 % (ref 12.0–46.0)
Lymphs Abs: 1.5 10*3/uL (ref 0.7–4.0)
MCHC: 33.1 g/dL (ref 30.0–36.0)
MCV: 95.2 fl (ref 78.0–100.0)
Monocytes Absolute: 0.6 10*3/uL (ref 0.1–1.0)
Monocytes Relative: 8.8 % (ref 3.0–12.0)
Neutro Abs: 4.2 10*3/uL (ref 1.4–7.7)
Neutrophils Relative %: 65.6 % (ref 43.0–77.0)
Platelets: 234 10*3/uL (ref 150.0–400.0)
RBC: 4.52 Mil/uL (ref 3.87–5.11)
RDW: 14.9 % (ref 11.5–15.5)
WBC: 6.4 10*3/uL (ref 4.0–10.5)

## 2022-09-30 LAB — TSH: TSH: 3.25 u[IU]/mL (ref 0.35–5.50)

## 2022-09-30 NOTE — Progress Notes (Signed)
Subjective:    Patient ID: Ariel Medina, female    DOB: [birth year], 89 y.o.   MRN: [id number]  DOS:  09/30/2022 Type of visit - description: Follow-up  In general feels well. See LOV, had a lump at the right neck, that resolved. Would like to stop oxygen. Reports her appetite is great, drinking lots of milk and eating cookies.  + Weight gain.  Denies chest pain, lower extremity edema at baseline. Denies chest pain or cough.  Wt Readings from Last 3 Encounters:  09/30/22 129 lb (58.5 kg)  08/04/22 123 lb (55.8 kg)  07/06/22 123 lb 4 oz (55.9 kg)     Review of Systems See above   Past Medical History:  Diagnosis Date   Anemia    Breast CA ([place])    left breast-invasive ductal ca stage 1-stopped arimidex 2008   COPD (chronic obstructive pulmonary disease) ([place]) 02/26/2022   Diverticulitis of colon    GI bleed 04/2020   Hiatal hernia    History of radiation therapy 08/05/2020   54 Gray in 3 fractions to the left upper lung nodule 07/29/2020-08/05/2020.   Dr Gery Pray   Hyperlipidemia    Hypertension    Hypothyroidism    Intussusception of intestine ([place]) 2021   Lung mass    Osteoarthritis    Osteopenia    DEXA 7/02   Pulmonary embolism (HCC)/DVT    Syncope 2005   CT head (-), ECHO essent. neg, stress test (-), carotid u/s (-)    Past Surgical History:  Procedure Laterality Date   BREAST BIOPSY Bilateral    BREAST LUMPECTOMY  04/2002   left   PARTIAL COLECTOMY N/A 05/01/2020   Procedure: PARTIAL COLECTOMY;  Surgeon: Coralie Keens, MD;  Location: [place];  Service: General;  Laterality: N/A;   TONSILLECTOMY AND ADENOIDECTOMY      Current Outpatient Medications  Medication Instructions   albuterol (VENTOLIN HFA) 108 (90 Base) MCG/ACT inhaler 2 puffs, Inhalation, Every 6 hours PRN   amLODipine (NORVASC) 5 mg, Oral, Daily   budesonide-formoterol (SYMBICORT) 160-4.5 MCG/ACT inhaler 2 puffs, Inhalation, 2 times daily   cyclobenzaprine (FLEXERIL) 5 mg, Oral, 2  times daily PRN   fluticasone (FLONASE) 50 MCG/ACT nasal spray 2 sprays, Each Nare, Daily PRN   hydrochlorothiazide (HYDRODIURIL) 12.5 mg, Oral, Daily   levothyroxine (SYNTHROID) 75 mcg, Oral, Daily before breakfast   loperamide (IMODIUM) 2 mg, Oral, Daily PRN   Multiple Vitamins-Minerals (PRESERVISION AREDS) TABS 1 tablet, Oral, 2 times daily   potassium chloride (KLOR-CON M) 10 MEQ tablet TAKE 1 TABLET EVERY DAY       Objective:   Physical Exam BP 124/80   Pulse 67   Temp 97.6 F (36.4 C) (Oral)   Resp 18   Ht '5\' 2"'$  (1.575 m)   Wt 129 lb (58.5 kg)   SpO2 96%   BMI 23.59 kg/m  General:   Well developed, NAD, BMI noted. HEENT:  Normocephalic . Face symmetric, atraumatic Neck: Had a couple of submandibular lumps, symmetric, likely salivary glands Lungs:  Decreased breath sounds Normal respiratory effort, no intercostal retractions, no accessory muscle use. Heart: RRR,  no murmur.  Lower extremities: no pretibial edema bilaterally  Skin: Not pale. Not jaundice Neurologic:  alert & oriented X3.  Speech normal, gait: Very limited, uses a cane, short steps, shuffles. Psych--  Cognition and judgment appear intact.  Cooperative with normal attention span and concentration.  Behavior appropriate. No anxious or depressed appearing.  Assessment      Assessment  HTN Hyperlipidemia.  Off statins per patient choice, likes to minimize medicines Hypothyroidism Osteopenia:  --T score -2.1 (04-2012) DC Fosamax 12/2013 after 5 years --T score -1.5   (02-2015)  --T score -3.6 (april 2019) on Prolia DJD GERD  Tremors, likely essential H/o diverticulitis Breast cancer, L breast, release from oncology 2013 Syncope 2005 11-2016: Fall, FX L pubic ramus, admitted then d/c to a NH, back home 12-18-16 Edema, R>L U/S (-)  for DVT 01/2017 Lung mass:No biopsy, suspected to be slow growing NSCLC ,s/p XRT (07-2020)  PLAN: HTN: BP is very good, last BMP okay.  Check a CBC. DVT,  bilateral PE 04-2020: See last office visit, Eliquis was stopped Hypothyroidism: Check TSH Hypoxia: On oxygen 24/7, she is currently not using oxygen, she checked her O2 sat 3 times a day and is usually 96%. Would like to start oxygen, I rec ONO to r/o nocturnal hypoxia but she declines.  Request to stop it due to being a financial burden.  Will do. Gait disorder: Permanent handicap parking provided Social: Grandson lives with her, still does very limited driving.  I accepted her sister as a new patient. RTC 3 to 4 months

## 2022-09-30 NOTE — Patient Instructions (Addendum)
Will stop the oxygen  GO TO THE LAB : Get the blood work     [place], [place] Come back for   checkup in 3 to 4 months   Vaccines I recommend:  Covid booster RSV vaccine

## 2022-10-01 NOTE — Assessment & Plan Note (Signed)
HTN: BP is very good, last BMP okay.  Check a CBC. DVT, bilateral PE 04-2020: See last office visit, Eliquis was stopped Hypothyroidism: Check TSH Hypoxia: On oxygen 24/7, she is currently not using oxygen, she checked her O2 sat 3 times a day and is usually 96%. Would like to start oxygen, I rec ONO to r/o nocturnal hypoxia but she declines.  Request to stop it due to being a financial burden.  Will do. Gait disorder: Permanent handicap parking provided Social: Grandson lives with her, still does very limited driving.  I accepted her sister as a new patient. RTC 3 to 4 months

## 2022-10-11 ENCOUNTER — Ambulatory Visit: Payer: Medicare HMO | Admitting: Podiatry

## 2022-11-01 ENCOUNTER — Telehealth (INDEPENDENT_AMBULATORY_CARE_PROVIDER_SITE_OTHER): Payer: Medicare HMO | Admitting: Internal Medicine

## 2022-11-01 ENCOUNTER — Encounter: Payer: Self-pay | Admitting: Internal Medicine

## 2022-11-01 VITALS — BP 128/82 | Ht 62.0 in

## 2022-11-01 DIAGNOSIS — I82442 Acute embolism and thrombosis of left tibial vein: Secondary | ICD-10-CM

## 2022-11-01 DIAGNOSIS — M7989 Other specified soft tissue disorders: Secondary | ICD-10-CM

## 2022-11-01 DIAGNOSIS — M79662 Pain in left lower leg: Secondary | ICD-10-CM

## 2022-11-01 MED ORDER — CEPHALEXIN 500 MG PO CAPS: 500.0000 mg | ORAL_CAPSULE | Freq: Four times a day (QID) | ORAL | 0 refills | Status: DC

## 2022-11-01 NOTE — Progress Notes (Unsigned)
Subjective:    Patient ID: Ariel Medina, female    DOB: Nov 20, 1927, 89 y.o.   MRN: [id number]  DOS:  11/01/2022 Type of visit - description: Virtual Visit via Video Note  I connected with the above patient  by a video enabled telemedicine application and verified that I am speaking with the correct person using two identifiers.   Location of patient: home  Location of provider: office  Persons participating in the virtual visit: patient, provider   I discussed the limitations of evaluation and management by telemedicine and the availability of in person appointments. The patient expressed understanding and agreed to proceed.  Acute: Symptoms started several days ago. Complaining of left leg swelling, some redness. No openings. Mild warmness of the side of the calf.  Denies fever or chills No chest pain no difficulty breathing       I discussed the assessment and treatment plan with the patient. The patient was provided an opportunity to ask questions and all were answered. The patient agreed with the plan and demonstrated an understanding of the instructions.   The patient was advised to call back or seek an in-person evaluation if the symptoms worsen or if the condition fails to improve as anticipated.   NO NEED FOR TIME ID DOCUMENTATION SUPPORTS A LEVEL III OR IV OK TO USE TEMPLATE AS BELOW IF NEEDED   Today, I spent more than    min with the patient: >50% of the time counseling regards  Also: -reviewing the chart and labs ordered by other providers  -coordinating his care      Review of Systems See above   Past Medical History:  Diagnosis Date   Anemia    Breast CA ([place])    left breast-invasive ductal ca stage 1-stopped arimidex 2008   COPD (chronic obstructive pulmonary disease) ([place]) 02/26/2022   Diverticulitis of colon    GI bleed 04/2020   Hiatal hernia    History of radiation therapy 08/05/2020   54 Gray in 3 fractions to the left upper lung  nodule 07/29/2020-08/05/2020.   Dr Gery Pray   Hyperlipidemia    Hypertension    Hypothyroidism    Intussusception of intestine ([place]) 2021   Lung mass    Osteoarthritis    Osteopenia    DEXA 7/02   Pulmonary embolism (HCC)/DVT    Syncope 2005   CT head (-), ECHO essent. neg, stress test (-), carotid u/s (-)    Past Surgical History:  Procedure Laterality Date   BREAST BIOPSY Bilateral    BREAST LUMPECTOMY  04/2002   left   PARTIAL COLECTOMY N/A 05/01/2020   Procedure: PARTIAL COLECTOMY;  Surgeon: Coralie Keens, MD;  Location: [place];  Service: General;  Laterality: N/A;   TONSILLECTOMY AND ADENOIDECTOMY      Current Outpatient Medications  Medication Instructions   albuterol (VENTOLIN HFA) 108 (90 Base) MCG/ACT inhaler 2 puffs, Inhalation, Every 6 hours PRN   amLODipine (NORVASC) 5 mg, Oral, Daily   budesonide-formoterol (SYMBICORT) 160-4.5 MCG/ACT inhaler 2 puffs, Inhalation, 2 times daily   cyclobenzaprine (FLEXERIL) 5 mg, Oral, 2 times daily PRN   fluticasone (FLONASE) 50 MCG/ACT nasal spray 2 sprays, Each Nare, Daily PRN   hydrochlorothiazide (HYDRODIURIL) 12.5 mg, Oral, Daily   levothyroxine (SYNTHROID) 75 mcg, Oral, Daily before breakfast   loperamide (IMODIUM) 2 mg, Oral, Daily PRN   Multiple Vitamins-Minerals (PRESERVISION AREDS) TABS 1 tablet, Oral, 2 times daily   potassium chloride (KLOR-CON M) 10 MEQ tablet  TAKE 1 TABLET EVERY DAY       Objective:   Physical Exam Ht 5\' 2"  (1.575 m)   BMI 23.59 kg/m  This is a virtual video visit.  We are assisted by [name]. She is sitting in a chair, looks comfortable. I was able to see the legs, R looks normal, L: Seems to be swelling from the knee down, slightly red.  I asked [name] to palpate the calf it did not hurt according to Ms. Gaelle.     Assessment      Assessment  HTN Hyperlipidemia.  Off statins per patient choice, likes to minimize medicines Hypothyroidism Osteopenia:  --T score -2.1 (04-2012)  DC Fosamax 12/2013 after 5 years --T score -1.5   (02-2015)  --T score -3.6 (april 2019) on Prolia DJD GERD  Tremors, likely essential H/o diverticulitis Breast cancer, L breast, release from oncology 2013 Syncope 2005 11-2016: Fall, FX L pubic ramus, admitted then d/c to a NH, back home 12-18-16 Edema, R>L U/S (-)  for DVT 01/2017 Lung mass:No biopsy, suspected to be slow growing NSCLC ,s/p XRT (07-2020)  PLAN: L leg swelling. They know my ability to do the diagnosis is limited due to this being a virtual visit however I suspect she has cellulitis. She also has a history of DVTs so is important to get a ultrasound to rule it out.  I offer her to proceed today but she really does not like to get out today so we agree Carolon will call radiology and set that up for tomorrow. In the meantime recommend cephalexin, leg elevation. ER if chest pain, fever, difficulty breathing. I asked the family to give me a call in about 3 days and let me know how she is doing.  HTN: BP is very good, last BMP okay.  Check a CBC. DVT, bilateral PE 04-2020: See last office visit, Eliquis was stopped Hypothyroidism: Check TSH Hypoxia: On oxygen 24/7, she is currently not using oxygen, she checked her O2 sat 3 times a day and is usually 96%. Would like to start oxygen, I rec ONO to r/o nocturnal hypoxia but she declines.  Request to stop it due to being a financial burden.  Will do. Gait disorder: Permanent handicap parking provided Social: Grandson lives with her, still does very limited driving.  I accepted her sister as a new patient. RTC 3 to 4 months

## 2022-11-02 ENCOUNTER — Ambulatory Visit (HOSPITAL_BASED_OUTPATIENT_CLINIC_OR_DEPARTMENT_OTHER)
Admission: RE | Admit: 2022-11-02 | Discharge: 2022-11-02 | Disposition: A | Discharge status: Home / Self Care | Payer: Medicare HMO | Source: Ambulatory Visit | Attending: Internal Medicine | Admitting: Internal Medicine

## 2022-11-02 ENCOUNTER — Telehealth: Payer: Self-pay

## 2022-11-02 DIAGNOSIS — M7989 Other specified soft tissue disorders: Secondary | ICD-10-CM | POA: Diagnosis not present

## 2022-11-02 DIAGNOSIS — M79662 Pain in left lower leg: Secondary | ICD-10-CM | POA: Insufficient documentation

## 2022-11-02 DIAGNOSIS — I82442 Acute embolism and thrombosis of left tibial vein: Secondary | ICD-10-CM | POA: Diagnosis not present

## 2022-11-02 MED ORDER — APIXABAN (ELIQUIS) VTE STARTER PACK (10MG AND 5MG): ORAL_TABLET | ORAL | 0 refills | Status: DC

## 2022-11-02 NOTE — Assessment & Plan Note (Signed)
L leg swelling. They know my ability to do the diagnosis is limited due to this being a virtual visit however I suspect she has cellulitis. She also has a history of DVTs so is important to get a ultrasound to rule it out.  I offer her to proceed today but she really does not like to get out today so we agreed will proceed  tomorrow. In the meantime recommend cephalexin, leg elevation. ER if chest pain, fever, difficulty breathing. I asked the family to give me a call in about 3 days and let me know how she is doing. Addendum: US showed a left posterior tibial clot.  Unprovoked other being sedentary d/t age . Plan: Start Eliquis 10 mg 1 tablet twice daily for 1 week, then 5 mg twice daily.  Send a prescription Follow-up in 3 weeks. Watch for increased leg swelling, chest pain, difficulty breathing, fever chills: Seek medical attention

## 2022-11-02 NOTE — Telephone Encounter (Signed)
Spoke w/ Carolyn-Pt's grand-daughter, informed of results and recommendations. Rx sent. Appt scheduled for 4 weeks (PCP on vacation at 3 weeks). [name] verbalized understanding.

## 2022-11-02 NOTE — Telephone Encounter (Signed)
Addendum: US showed a left posterior tibial clot.  Unprovoked. Plan: Communicate with the family Start Eliquis 10 mg 1 tablet twice daily for 1 week, then 5 mg twice daily.  Send a prescription Follow-up in 3 weeks. Watch for increased leg swelling, chest pain, difficulty breathing, fever chills: Seek medical attention

## 2022-11-08 ENCOUNTER — Other Ambulatory Visit: Payer: Self-pay | Admitting: Internal Medicine

## 2022-11-08 ENCOUNTER — Ambulatory Visit: Payer: Medicare HMO | Admitting: Internal Medicine

## 2022-11-08 DIAGNOSIS — E039 Hypothyroidism, unspecified: Secondary | ICD-10-CM

## 2022-11-15 ENCOUNTER — Other Ambulatory Visit: Payer: Self-pay | Admitting: Internal Medicine

## 2022-11-29 ENCOUNTER — Ambulatory Visit: Payer: Medicare HMO | Admitting: Internal Medicine

## 2022-12-01 ENCOUNTER — Telehealth: Payer: Self-pay

## 2022-12-01 MED ORDER — APIXABAN 2.5 MG PO TABS: 2.5000 mg | ORAL_TABLET | Freq: Two times a day (BID) | ORAL | 0 refills | Status: DC

## 2022-12-01 NOTE — Telephone Encounter (Signed)
Pt states she is out of the Eliquis. She was started on the starter pack and needs refills if she is too continue medication. Pt prefers [facility] pharmacy this time. Please advise.

## 2022-12-01 NOTE — Telephone Encounter (Signed)
Patient is 89, last weight 58.5 kg.  Plan:  Advise patient, will need Eliquis 2.5 mg twice daily.  This is a reduced dose due to age and weight.  Send a 4-month supply

## 2022-12-01 NOTE — Addendum Note (Signed)
Addended byConrad Marengo D on: 12/01/2022 11:43 AM   Modules accepted: Orders

## 2022-12-01 NOTE — Telephone Encounter (Signed)
Please advise on Eliquis dose.

## 2022-12-01 NOTE — Telephone Encounter (Signed)
Rx sent 

## 2022-12-06 ENCOUNTER — Ambulatory Visit: Payer: Medicare HMO | Admitting: Internal Medicine

## 2022-12-06 ENCOUNTER — Ambulatory Visit: Payer: Medicare HMO | Admitting: Podiatry

## 2022-12-06 ENCOUNTER — Encounter: Payer: Self-pay | Admitting: Podiatry

## 2022-12-06 DIAGNOSIS — M79676 Pain in unspecified toe(s): Secondary | ICD-10-CM

## 2022-12-06 DIAGNOSIS — Q828 Other specified congenital malformations of skin: Secondary | ICD-10-CM | POA: Diagnosis not present

## 2022-12-06 DIAGNOSIS — B351 Tinea unguium: Secondary | ICD-10-CM

## 2022-12-06 NOTE — Progress Notes (Signed)
This patient returns to the office for evaluation and treatment of long thick painful nails .  This patient is unable to trim her own nails since the patient cannot reach the feet.  Patient says the nails are painful walking and wearing her shoes.  She has painful callus on the outside of  both her feet..    She  returns for preventive foot care services.   General Appearance  Alert, conversant and in no acute stress.  Vascular  Dorsalis pedis and posterior tibial  pulses are weakly  palpable  bilaterally.  Capillary return is within normal limits  bilaterally. Temperature is within normal limits  bilaterally.  Neurologic  Senn-Weinstein monofilament wire test within normal limits  bilaterally. Muscle power within normal limits bilaterally.  Nails Thick disfigured discolored nails with subungual debris  from hallux to fifth toes bilaterally. No evidence of bacterial infection or drainage bilaterally.  Orthopedic  No limitations of motion  feet .  No crepitus or effusions noted.  No bony pathology or digital deformities noted. HAV  B/L  Skin  normotropic skin  noted bilaterally.  No signs of infections or ulcers noted.   Porokeratosis sub 5th met B/L.  Callus secondary HAV  B/L.  Onychomycosis  Pain in toes right foot  Pain in toes left foot  Porokeratosis sub 5th met B/L.  Debridement  of nails  1-5  B/L with a nail nipper.  Nails were then filed using a dremel tool with no incidents.  Debride porokeratosis with  # 15 blade and dremel tool     RTC 10  weeks   Kolbee Stallman DPM  

## 2022-12-12 ENCOUNTER — Other Ambulatory Visit: Payer: Self-pay | Admitting: Internal Medicine

## 2022-12-27 ENCOUNTER — Ambulatory Visit (INDEPENDENT_AMBULATORY_CARE_PROVIDER_SITE_OTHER): Payer: Medicare HMO | Admitting: Pharmacist

## 2022-12-27 DIAGNOSIS — Z79899 Other long term (current) drug therapy: Secondary | ICD-10-CM

## 2022-12-27 DIAGNOSIS — I10 Essential (primary) hypertension: Secondary | ICD-10-CM

## 2022-12-27 DIAGNOSIS — E039 Hypothyroidism, unspecified: Secondary | ICD-10-CM

## 2022-12-27 NOTE — Patient Instructions (Signed)
Symptoms of gastrointestinal (GI) bleeding may include  black or tarry stool bright red blood in vomit cramps in the abdomen dark or bright red blood mixed with stool dizziness or faintness feeling tired paleness shortness of breath vomit that looks like coffee grounds Weakness  Call office if have any of the above symptoms.

## 2022-12-27 NOTE — Progress Notes (Signed)
12/27/2022  Patient ID: Ariel Medina, female   DOB: 02/02/1928, 89 y.o.   MRN: [id number]  Subjective: Ariel Medina is a 89 y.o. year old female who is a primary care patient of Wanda Plump, MD. Clinical Pharmacist Practitioner referral was placed to assist with medication management.    Engaged with patient by telephone for follow up visit today.  COPD: patient is using Symbicort 2 puffs twice a day. She has not needed to use either her supplemental oxygen or rescue albuterol inhaler in the last 2 or 3 months. She is checking pulse ox and report home O2 level was been > 90% the last 2 months.  Today pulse Ox was 94%.  Hypertension: Office blood pressure has been at goal. She has been checking blood pressure a few times per week at home but has not been writing blood pressure readings down and coul dnot remember her last reading. Currently taking hydrochlorothiazide 25mg  every other day (states tablets are too small to take 0.5 tablet daily)   Hypokalemia: patient is taking potassium 10 mEq once daily. Last potassium was WNL at 4.2 (07/06/2022)  History of bilateral PE and lung nodule with hypermetabolism. She under went SBRT for 3 treatments to lung lesions. No biopsy has been completed due to risk of age and anticoagulation.  Also has history of GI bleed. She was taking Eliquis 2.5mg  twice a day but was stopped in November 2023 due to risk of bleeding and falls.  Unfortunately 11/01/2022 he was seen for redness and swelling in her left leg. Ultrasound showed that she had left posterior tibial clot / unprovoked. Started Eliquis 10mg  twice a day for 1 week, then 5mg  twice a day. Dose was decreased based on her age and weight to 2.5mg  twice a day on 12/01/2022. Per patient she received 90 day supply recently for cost of $12.  She denies s/s of bleeding today - not dark, tarry stools, no nosebleeds. She does report that she does bruise more easily since she restarted Eliquis.   Medication  maintenance: patient reports medications are affordable. Most copays are $0. Getting medications from [facility] mail order now and this has been a big help because patient does not drive much. Continues to use her over-the-counter benefits of $50 per quarter for AREDS vitamins.   Objective: Review of patient status, including review of consultants reports, laboratory and other test data, was performed as part of comprehensive evaluation and provision of chronic care management services.   Lab Results  Component Value Date   CREATININE 0.64 07/06/2022   CREATININE 0.55 11/27/2021   CREATININE 0.68 11/13/2021    No results found for: "HGBA1C"     Component Value Date/Time   CHOL 248 (H) 06/26/2019 0954   TRIG 96.0 06/26/2019 0954   TRIG 77 06/14/2006 0824   HDL 60.60 06/26/2019 0954   CHOLHDL 4 06/26/2019 0954   VLDL 19.2 06/26/2019 0954   LDLCALC 169 (H) 06/26/2019 0954   LDLDIRECT 150.2 06/07/2013 0907     Clinical ASCVD: Yes  The ASCVD Risk score (Arnett DK, et al., 2019) failed to calculate for the following reasons:   The 2019 ASCVD risk score is only valid for ages 59 to 20    BP Readings from Last 3 Encounters:  11/01/22 128/82  09/30/22 124/80  07/06/22 126/80     Allergies  Allergen Reactions   Ibuprofen     REACTION: bleed    Medications Reviewed Today     Reviewed  by Helane Gunther, DPM (Physician) on 12/06/22 at [phone]  Med List Status: <None>   Medication Order Taking? Sig Documenting Provider Last Dose Status Informant  albuterol (VENTOLIN HFA) 108 (90 Base) MCG/ACT inhaler [id number] Yes Inhale 2 puffs into the lungs every 6 (six) hours as needed for wheezing or shortness of breath. Glade Lloyd, MD Taking Active   amLODipine (NORVASC) 5 MG tablet [id number] Yes Take 1 tablet (5 mg total) by mouth daily. Wanda Plump, MD Taking Active   apixaban [facility]) 2.5 MG TABS tablet [id number] Yes Take 1 tablet (2.5 mg total) by mouth 2 (two) times daily. Wanda Plump, MD Taking Active   budesonide-formoterol [facility]) 160-4.5 MCG/ACT inhaler [id number] Yes Inhale 2 puffs into the lungs 2 (two) times daily. Wanda Plump, MD Taking Active   cephALEXin [facility]) 500 MG capsule [id number] Yes Take 1 capsule (500 mg total) by mouth 4 (four) times daily. Wanda Plump, MD Taking Active   cyclobenzaprine (FLEXERIL) 5 MG tablet [id number] Yes Take 1 tablet (5 mg total) by mouth 2 (two) times daily as needed for muscle spasms. Wanda Plump, MD Taking Active   fluticasone [facility]) 50 MCG/ACT nasal spray [id number] Yes Place 2 sprays into both nostrils daily as needed for allergies or rhinitis. [name], [name], NP Taking Active Self  hydrochlorothiazide (HYDRODIURIL) 25 MG tablet [id number] Yes TAKE 1/2 TABLET EVERY DAY  Patient taking differently: Take 25 mg by mouth every other day.   Worthy Rancher B, FNP Taking Active   levothyroxine (SYNTHROID) 75 MCG tablet [id number] Yes Take 1 tablet (75 mcg total) by mouth daily before breakfast. Wanda Plump, MD Taking Active   loperamide (IMODIUM) 2 MG capsule [id number] Yes Take 1 capsule (2 mg total) by mouth daily as needed for diarrhea or loose stools. Wanda Plump, MD Taking Active            Med Note (CANTER, Vicente Males D   Tue Jul 06, 2022  9:22 AM) PRN  Multiple Vitamins-Minerals (PRESERVISION AREDS) TABS [id number] Yes Take 1 tablet by mouth in the morning and at bedtime. [provider] Taking Active Self  potassium chloride Jerene Dilling) 10 MEQ tablet [id number] Yes TAKE 1 TABLET EVERY DAY Eulis Foster, FNP Taking Active   Med List Note Nolon Bussing, CPhT 05/08/20 [id number]): [facility]            Patient Active Problem List   Diagnosis Date Noted   COPD (chronic obstructive pulmonary disease) (HCC) 02/26/2022   Lung mass 11/08/2021   Acute hypoxemic respiratory failure (HCC) 11/08/2021   Acute respiratory failure with hypoxia (HCC) 11/07/2021   Cough 11/07/2021   Chronic diarrhea 11/07/2021    Solitary pulmonary nodule 07/09/2020   Abnormal findings on diagnostic imaging of lung 05/30/2020   Acute deep vein thrombosis (DVT) of proximal vein of both lower extremities (HCC) 05/30/2020   VTE (venous thromboembolism) 05/30/2020   Goals of care, counseling/discussion    Palliative care by specialist    DNR (do not resuscitate) discussion    Intussusception (HCC) 04/29/2020   Pulmonary embolism, bilateral (HCC) 04/29/2020   Hypoxia 04/29/2020   Pre-ulcerative calluses 01/25/2020   Essential hypertension 11/29/2016   Closed fracture of multiple pubic rami (HCC) 11/29/2016   Fall    Closed fracture of left pubis (HCC) 11/28/2016   Hypokalemia 11/28/2016   Hearing loss 09/19/2015   PCP NOTES >>> 05/13/2015   Allergic rhinitis 10/24/2014   Annual physical exam 04/14/2011  BACK PAIN, shoulder pain 12/11/2007   Osteoporosis 10/20/2007   NEOPLASM, MALIGNANT, BREAST, HX OF 10/20/2007   Hypothyroidism 01/30/2007   Hyperlipidemia 01/30/2007   HTN  01/30/2007   OSTEOARTHRITIS 01/30/2007     Medication Assistance:  None required.  Patient affirms current coverage meets needs.   Assessment / Plan: COPD: Controlled Continue Symbicort 2 puffs twice a day.  Continue to use supplemental O2 and albuterol as needed.  Continue to check O2 sats at home - contact office of < 90% Hypertension: Controlled Continue to check blood pressure at home 1 to 2 times per week. Recommended patient record blood pressure readings Reviewed blood pressure goals Continue hydrochlorothiazide 25mg  every other day and amlodipine 5mg  daily.  History of bilateral PE and recent unprovoked left tibial clot:  Reviewed s/s of bleeding. Patient reminded to contact office if she notices s/s of bleeding.  Continue Eliquis 2.5mg  twice a day  Hypokalemia:  Continue potassium 10 mEq once daily.   Follow Up:  Telephone follow up appointment with care management team member scheduled for:  4 to 5 months   Henrene Pastor, PharmD Clinical Pharmacist [facility]  - [facility] [phone]

## 2023-01-21 DIAGNOSIS — H5213 Myopia, bilateral: Secondary | ICD-10-CM | POA: Diagnosis not present

## 2023-01-31 ENCOUNTER — Ambulatory Visit (INDEPENDENT_AMBULATORY_CARE_PROVIDER_SITE_OTHER): Payer: Medicare HMO | Admitting: Internal Medicine

## 2023-01-31 ENCOUNTER — Encounter: Payer: Self-pay | Admitting: Internal Medicine

## 2023-01-31 VITALS — BP 126/64 | HR 70 | Temp 97.9°F | Resp 18 | Ht 62.0 in | Wt 129.0 lb

## 2023-01-31 DIAGNOSIS — E039 Hypothyroidism, unspecified: Secondary | ICD-10-CM | POA: Diagnosis not present

## 2023-01-31 DIAGNOSIS — I82442 Acute embolism and thrombosis of left tibial vein: Secondary | ICD-10-CM | POA: Diagnosis not present

## 2023-01-31 DIAGNOSIS — I10 Essential (primary) hypertension: Secondary | ICD-10-CM

## 2023-01-31 LAB — CBC WITH DIFFERENTIAL/PLATELET
Basophils Absolute: 0 10*3/uL (ref 0.0–0.1)
Basophils Relative: 0.3 % (ref 0.0–3.0)
Eosinophils Absolute: 0 10*3/uL (ref 0.0–0.7)
Eosinophils Relative: 0.6 % (ref 0.0–5.0)
HCT: 41.9 % (ref 36.0–46.0)
Hemoglobin: 13.7 g/dL (ref 12.0–15.0)
Lymphocytes Relative: 20.3 % (ref 12.0–46.0)
Lymphs Abs: 1.3 10*3/uL (ref 0.7–4.0)
MCHC: 32.7 g/dL (ref 30.0–36.0)
MCV: 95.5 fl (ref 78.0–100.0)
Monocytes Absolute: 0.6 10*3/uL (ref 0.1–1.0)
Monocytes Relative: 8.6 % (ref 3.0–12.0)
Neutro Abs: 4.6 10*3/uL (ref 1.4–7.7)
Neutrophils Relative %: 70.2 % (ref 43.0–77.0)
Platelets: 229 10*3/uL (ref 150.0–400.0)
RBC: 4.39 Mil/uL (ref 3.87–5.11)
RDW: 15.4 % (ref 11.5–15.5)
WBC: 6.6 10*3/uL (ref 4.0–10.5)

## 2023-01-31 LAB — ALT: ALT: 7 U/L (ref 0–35)

## 2023-01-31 LAB — AST: AST: 18 U/L (ref 0–37)

## 2023-01-31 LAB — BASIC METABOLIC PANEL
BUN: 15 mg/dL (ref 6–23)
CO2: 32 mEq/L (ref 19–32)
Calcium: 9.9 mg/dL (ref 8.4–10.5)
Chloride: 100 mEq/L (ref 96–112)
Creatinine, Ser: 0.71 mg/dL (ref 0.40–1.20)
GFR: 73.61 mL/min (ref >60.00)
Glucose, Bld: 83 mg/dL (ref 70–99)
Potassium: 4 mEq/L (ref 3.5–5.1)
Sodium: 138 mEq/L (ref 135–145)

## 2023-01-31 LAB — TSH: TSH: 3.06 u[IU]/mL (ref 0.35–5.50)

## 2023-01-31 MED ORDER — HYDROCHLOROTHIAZIDE 12.5 MG PO TABS: 12.5000 mg | ORAL_TABLET | Freq: Every day | ORAL | 1 refills | Status: DC

## 2023-01-31 NOTE — Progress Notes (Unsigned)
Subjective:    Patient ID: Ariel Medina, female    DOB: Jan 31, 1928, 89 y.o.   MRN: [id number]  DOS:  01/31/2023 Type of visit - description: Follow-up, here with her sister  See LOV, diagnosed with a DVT, started Eliquis. Reports good compliance and no apparent side effects.  Left leg swelling slightly better. Denies chest pain or difficulty breathing. No palpitations No blood in the stools or in the urine. No headaches.  Review of Systems See above   Past Medical History:  Diagnosis Date   Anemia    Breast CA (HCC)    left breast-invasive ductal ca stage 1-stopped arimidex 2008   COPD (chronic obstructive pulmonary disease) (HCC) 02/26/2022   Diverticulitis of colon    GI bleed 04/2020   Hiatal hernia    History of radiation therapy 08/05/2020   54 Gray in 3 fractions to the left upper lung nodule 07/29/2020-08/05/2020.   Dr Antony Blackbird   Hyperlipidemia    Hypertension    Hypothyroidism    Intussusception of intestine (HCC) 2021   Lung mass    Osteoarthritis    Osteopenia    DEXA 7/02   Pulmonary embolism (HCC)/DVT    Syncope 2005   CT head (-), ECHO essent. neg, stress test (-), carotid u/s (-)    Past Surgical History:  Procedure Laterality Date   BREAST BIOPSY Bilateral    BREAST LUMPECTOMY  04/2002   left   PARTIAL COLECTOMY N/A 05/01/2020   Procedure: PARTIAL COLECTOMY;  Surgeon: Abigail Miyamoto, MD;  Location: [facility] OR;  Service: General;  Laterality: N/A;   TONSILLECTOMY AND ADENOIDECTOMY      Current Outpatient Medications  Medication Instructions   albuterol (VENTOLIN HFA) 108 (90 Base) MCG/ACT inhaler 2 puffs, Inhalation, Every 6 hours PRN   amLODipine (NORVASC) 5 mg, Oral, Daily   apixaban (ELIQUIS) 2.5 mg, Oral, 2 times daily   budesonide-formoterol (SYMBICORT) 160-4.5 MCG/ACT inhaler 2 puffs, Inhalation, 2 times daily   cyclobenzaprine (FLEXERIL) 5 mg, Oral, 2 times daily PRN   fluticasone (FLONASE) 50 MCG/ACT nasal spray 2 sprays, Each Nare,  Daily PRN   hydrochlorothiazide (HYDRODIURIL) 12.5 mg, Oral, Daily   levothyroxine (SYNTHROID) 75 mcg, Oral, Daily before breakfast   loperamide (IMODIUM) 2 mg, Oral, Daily PRN   Multiple Vitamins-Minerals (PRESERVISION AREDS) TABS 1 tablet, Oral, 2 times daily   potassium chloride (KLOR-CON M) 10 MEQ tablet TAKE 1 TABLET EVERY DAY       Objective:   Physical Exam BP 126/64   Pulse 70   Temp 97.9 F (36.6 C) (Oral)   Resp 18   Ht 5\' 2"  (1.575 m)   Wt 129 lb (58.5 kg)   SpO2 96%   BMI 23.59 kg/m  General:   Well developed, NAD, BMI noted. HEENT:  Normocephalic . Face symmetric, atraumatic Lungs:  CTA B Normal respiratory effort, no intercostal retractions, no accessory muscle use. Heart: RRR,  no murmur.  Lower extremities: Calves symmetric, no TTP.  Has some left Perry ankle swelling, not far from baseline. Skin: Not pale. Not jaundice Neurologic:  alert & oriented X3.  Speech normal, gait: Assisted by cane, shuffles (at baseline).  Significant tremor noted Psych--  Cognition and judgment appear intact.  Cooperative with normal attention span and concentration.  Behavior appropriate. No anxious or depressed appearing.      Assessment      Assessment  HTN Hyperlipidemia.  Off statins per patient choice, likes to minimize medicines Hypothyroidism Osteopenia:  --T  score -2.1 (04-2012) DC Fosamax 12/2013 after 5 years --T score -1.5   (02-2015)  --T score -3.6 (april 2019) on Prolia DJD GERD  Tremors, likely essential H/o diverticulitis Breast cancer, L breast, release from oncology 2013 11-2016: Fall, FX L pubic ramus, admitted then d/c to a NH, back home 12-18-16 Lung mass:No biopsy, suspected to be slow growing NSCLC ,s/p XRT (07-2020) Hematology: ---Edema, R>L U/S (-) for DVT 01/2017  --B DVT and PE 04-2020 in the context of an acute admission to the hospital --DVT, left posterior tibial vein, unprovoked, 10-2022, Rx Eliquis   PLAN: DVT: Dx 11/02/2022, left  posterior tibial vein at the level of the ankle.  On Eliquis 2.5 mg twice daily adjusted for age.  History of bilateral DVT and PE 2021. Refer to hematology for opinion in regards to length of anticoagulation.  At age 66, risk of prolonged anti-coag could be > than benefits . Check a CBC. HTN: On amlodipine, HCTZ 25 mg: 1/2 tab every day, unable to break the tablets.  Will prescribe HCTZ 12.5 mg 1 tablet daily, continue potassium.  Check BMP, CBC. Hypothyroidism: On Synthroid, check TSH RTC 4 m.

## 2023-01-31 NOTE — Patient Instructions (Addendum)
Hydrochlorothiazide 25 mg: Stop  Hydrochlorothiazide 12.5 mg 1 tablet a day  Other medications the same   Vaccines I recommend: Covid booster RSV vaccine Flu shot (fall)  We are referring you to the hematologist     GO TO THE LAB : Get the blood work     GO TO THE FRONT DESK, PLEASE SCHEDULE YOUR APPOINTMENTS Come back for a physical exam in 4 months

## 2023-02-01 NOTE — Assessment & Plan Note (Signed)
DVT: Dx 11/02/2022, left posterior tibial vein at the level of the ankle.  On Eliquis 2.5 mg twice daily adjusted for age.  History of bilateral DVT and PE 2021. Refer to hematology for opinion in regards to length of anticoagulation.  At age 89, risk of prolonged anti-coag could be > than benefits . Check a CBC. HTN: On amlodipine, HCTZ 25 mg: 1/2 tab every day, unable to break the tablets.  Will prescribe HCTZ 12.5 mg 1 tablet daily, continue potassium.  Check BMP, CBC. Hypothyroidism: On Synthroid, check TSH RTC 4 m.

## 2023-02-14 ENCOUNTER — Inpatient Hospital Stay: Payer: Medicare HMO | Attending: Medical Oncology | Admitting: Medical Oncology

## 2023-02-14 ENCOUNTER — Inpatient Hospital Stay: Payer: Medicare HMO

## 2023-02-14 ENCOUNTER — Encounter: Payer: Self-pay | Admitting: Medical Oncology

## 2023-02-14 ENCOUNTER — Other Ambulatory Visit: Payer: Self-pay

## 2023-02-14 VITALS — BP 125/80 | HR 72 | Temp 97.8°F | Resp 19 | Ht 62.0 in | Wt 129.4 lb

## 2023-02-14 DIAGNOSIS — Z86718 Personal history of other venous thrombosis and embolism: Secondary | ICD-10-CM | POA: Diagnosis not present

## 2023-02-14 DIAGNOSIS — I824Y3 Acute embolism and thrombosis of unspecified deep veins of proximal lower extremity, bilateral: Secondary | ICD-10-CM

## 2023-02-14 DIAGNOSIS — Z86711 Personal history of pulmonary embolism: Secondary | ICD-10-CM | POA: Diagnosis not present

## 2023-02-14 DIAGNOSIS — Z7901 Long term (current) use of anticoagulants: Secondary | ICD-10-CM | POA: Diagnosis not present

## 2023-02-14 DIAGNOSIS — I4891 Unspecified atrial fibrillation: Secondary | ICD-10-CM

## 2023-02-14 DIAGNOSIS — I2699 Other pulmonary embolism without acute cor pulmonale: Secondary | ICD-10-CM

## 2023-02-14 NOTE — Progress Notes (Signed)
[facility] [phone]   [phone]  INITIAL CONSULT NOTE  Patient Care Team: Wanda Plump, MD as PCP - General Rollene Rotunda, MD as PCP - Cardiology (Cardiology) Keturah Barre, MD as Consulting Physician (Otolaryngology) Helane Gunther, DPM as Consulting Physician (Podiatry) Henrene Pastor, RPH-CPP (Pharmacist)  CHIEF COMPLAINTS/PURPOSE OF CONSULTATION:  Assessment of anticoagulant   HISTORY OF PRESENTING ILLNESS:  [name] 89 y.o. female is referred to our office by their PCP Dr. Drue Novel for DVT:  Patient has history of many thrombolic events.  In June 2018 she had an unprovoked DVT, she had a DVT and pulmonary embolism in September 2021 (in the setting of acute hospitalization), she then had an unprovoked DVT of the left posterior tibial vein found in March of this year.   She reports that prior to the most recent DVT she had been on long-term Eliquis but requested that this medication be stopped as it makes her cold.  She reports that she now regrets having requested to have this medication removed due to obtaining a subsequent blood clot.  She reports that she currently is tolerating the Eliquis well.  Reports no bleeding episodes such as epistaxis, hemoptysis, bleeding of the gums, hematuria, melena or events of large bruising.  Does not have a history of GI bleed.  No history of an IVC filter.   Although patient is elderly she still performs all of her ADLs, drives, goes to church, etc. she states that she is extremely cautious to prevent falls and has not had a fall in quite some time.    MEDICAL HISTORY:  Past Medical History:  Diagnosis Date   Anemia    Breast CA (HCC)    left breast-invasive ductal ca stage 1-stopped arimidex 2008   COPD (chronic obstructive pulmonary disease) (HCC) 02/26/2022   Diverticulitis of colon    GI bleed 04/2020   Hiatal hernia    History of radiation therapy 08/05/2020   54 Gray in 3 fractions to  the left upper lung nodule 07/29/2020-08/05/2020.   Dr Antony Blackbird   Hyperlipidemia    Hypertension    Hypothyroidism    Intussusception of intestine (HCC) 2021   Lung mass    Osteoarthritis    Osteopenia    DEXA 7/02   Pulmonary embolism (HCC)/DVT    Syncope 2005   CT head (-), ECHO essent. neg, stress test (-), carotid u/s (-)    SURGICAL HISTORY: Past Surgical History:  Procedure Laterality Date   BREAST BIOPSY Bilateral    BREAST LUMPECTOMY  04/2002   left   PARTIAL COLECTOMY N/A 05/01/2020   Procedure: PARTIAL COLECTOMY;  Surgeon: Abigail Miyamoto, MD;  Location: [facility] OR;  Service: General;  Laterality: N/A;   TONSILLECTOMY AND ADENOIDECTOMY      SOCIAL HISTORY: Social History   Socioeconomic History   Marital status: Widowed    Spouse name: Not on file   Number of children: 2   Years of education: Not on file   Highest education level: Not on file  Occupational History   Occupation: n/a    Employer: RETIRED  Tobacco Use   Smoking status: Never   Smokeless tobacco: Never  Vaping Use   Vaping Use: Never used  Substance and Sexual Activity   Alcohol use: No   Drug use: No   Sexual activity: Not Currently  Other Topics Concern   Not on file  Social History Narrative   Lost a son 59   Widow  since 1993   G-son  live w/ her from time to time, not permanently   G-daughter Rayfield Citizen lives 15 min away    Sister live next door , does help the pt    G-son @ [employer] , G-daughter @ [employer]    still drives, short distances, typically not at night.   Social Determinants of Health   Financial Resource Strain: Low Risk  (08/04/2022)   Overall Financial Resource Strain (CARDIA)    Difficulty of Paying Living Expenses: Not very hard  Food Insecurity: No Food Insecurity (08/04/2022)   Hunger Vital Sign    Worried About Running Out of Food in the Last Year: Never true    Ran Out of Food in the Last Year: Never true  Transportation Needs: No Transportation Needs  (08/04/2022)   PRAPARE - Administrator, Civil Service (Medical): No    Lack of Transportation (Non-Medical): No  Physical Activity: Insufficiently Active (08/04/2022)   Exercise Vital Sign    Days of Exercise per Week: 5 days    Minutes of Exercise per Session: 10 min  Stress: No Stress Concern Present (08/04/2022)   [employer] of Occupational Health - Occupational Stress Questionnaire    Feeling of Stress : Not at all  Social Connections: Moderately Isolated (08/04/2022)   Social Connection and Isolation Panel [NHANES]    Frequency of Communication with Friends and Family: More than three times a week    Frequency of Social Gatherings with Friends and Family: More than three times a week    Attends Religious Services: More than 4 times per year    Active Member of [employer] or Organizations: No    Attends Banker Meetings: Never    Marital Status: Widowed  Intimate Partner Violence: Not At Risk (08/04/2022)   Humiliation, Afraid, Rape, and Kick questionnaire    Fear of Current or Ex-Partner: No    Emotionally Abused: No    Physically Abused: No    Sexually Abused: No    FAMILY HISTORY: Family History  Problem Relation Age of Onset   Heart attack Brother 2   Diabetes Mother    Stroke Father 42   Stroke Sister 68   Colon cancer Neg Hx    Breast cancer Neg Hx     ALLERGIES:  is allergic to ibuprofen.  MEDICATIONS:  Current Outpatient Medications  Medication Sig Dispense Refill   amLODipine (NORVASC) 5 MG tablet Take 1 tablet (5 mg total) by mouth daily. 90 tablet 1   apixaban (ELIQUIS) 2.5 MG TABS tablet Take 1 tablet (2.5 mg total) by mouth 2 (two) times daily. 180 tablet 0   budesonide-formoterol (SYMBICORT) 160-4.5 MCG/ACT inhaler Inhale 2 puffs into the lungs 2 (two) times daily. 30.6 g 1   hydrochlorothiazide (HYDRODIURIL) 12.5 MG tablet Take 1 tablet (12.5 mg total) by mouth daily. 90 tablet 1   levothyroxine (SYNTHROID) 75 MCG tablet  Take 1 tablet (75 mcg total) by mouth daily before breakfast. 90 tablet 1   loperamide (IMODIUM) 2 MG capsule Take 1 capsule (2 mg total) by mouth daily as needed for diarrhea or loose stools.     Multiple Vitamins-Minerals (PRESERVISION AREDS) TABS Take 1 tablet by mouth in the morning and at bedtime.     potassium chloride (KLOR-CON M) 10 MEQ tablet TAKE 1 TABLET EVERY DAY 90 tablet 3   albuterol (VENTOLIN HFA) 108 (90 Base) MCG/ACT inhaler Inhale 2 puffs into the lungs every 6 (six) hours as needed for  wheezing or shortness of breath. (Patient not taking: Reported on 02/14/2023) 8 g 0   cyclobenzaprine (FLEXERIL) 5 MG tablet Take 1 tablet (5 mg total) by mouth 2 (two) times daily as needed for muscle spasms. (Patient not taking: Reported on 02/14/2023) 20 tablet 0   fluticasone (FLONASE) 50 MCG/ACT nasal spray Place 2 sprays into both nostrils daily as needed for allergies or rhinitis. (Patient not taking: Reported on 02/14/2023) 48 g 0   No current facility-administered medications for this visit.    REVIEW OF SYSTEMS:   Constitutional: ( - ) fevers, ( - )  chills , ( - ) night sweats Eyes: ( - ) blurriness of vision, ( - ) double vision, ( - ) watery eyes Ears, nose, mouth, throat, and face: ( - ) mucositis, ( - ) sore throat Respiratory: ( - ) cough, ( - ) dyspnea, ( - ) wheezes Cardiovascular: ( - ) palpitation, ( - ) chest discomfort, ( - ) lower extremity swelling Gastrointestinal:  ( - ) nausea, ( - ) heartburn, ( - ) change in bowel habits Skin: ( - ) abnormal skin rashes Lymphatics: ( - ) new lymphadenopathy, ( - ) easy bruising Neurological: ( - ) numbness, ( - ) tingling, ( - ) new weaknesses Behavioral/Psych: ( - ) mood change, ( - ) new changes  All other systems were reviewed with the patient and are negative.  PHYSICAL EXAMINATION: ECOG PERFORMANCE STATUS: 0 - Asymptomatic  Vitals:   02/14/23 1303  BP: 125/80  Pulse: 72  Resp: 19  Temp: 97.8 F (36.6 C)  SpO2: 100%    Filed Weights   02/14/23 1303  Weight: 129 lb 6.4 oz (58.7 kg)    GENERAL: well appearing elderly non frail female in NAD  SKIN: skin color, texture, turgor are normal, no rashes or significant lesions. No large areas of ecchymosis EYES: conjunctiva are pink and non-injected, sclera clear OROPHARYNX: no exudate, no erythema; lips, buccal mucosa, and tongue normal  NECK: supple, non-tender LYMPH:  no palpable lymphadenopathy in the cervical, axillary or supraclavicular lymph nodes.  LUNGS: clear to auscultation and percussion with normal breathing effort HEART: regular rate & rhythm and no murmurs and no lower extremity edema ABDOMEN: soft, non-tender, non-distended Musculoskeletal: no cyanosis of digits and no clubbing  PSYCH: alert & oriented x 3, fluent speech NEURO: Shuffled but steady gait. Mild resting tremor.   LABORATORY DATA:  I have reviewed the data as listed    Latest Ref Rng & Units 01/31/2023   11:31 AM 09/30/2022   11:09 AM 11/27/2021   11:41 AM  CBC  WBC 4.0 - 10.5 K/uL 6.6  6.4  5.1   Hemoglobin 12.0 - 15.0 g/dL 54.0  98.1  19.1   Hematocrit 36.0 - 46.0 % 41.9  43.1  39.0   Platelets 150.0 - 400.0 K/uL 229.0  234.0  243.0        Latest Ref Rng & Units 01/31/2023   11:31 AM 07/06/2022   10:20 AM 11/27/2021   11:41 AM  CMP  Glucose 70 - 99 mg/dL 83  78  84   BUN 6 - 23 mg/dL 15  11  10    Creatinine 0.40 - 1.20 mg/dL 4.78  2.95  6.21   Sodium 135 - 145 mEq/L 138  141  138   Potassium 3.5 - 5.1 mEq/L 4.0  4.2  3.9   Chloride 96 - 112 mEq/L 100  99  95   CO2 19 -  32 mEq/L 32  34  35   Calcium 8.4 - 10.5 mg/dL 9.9  9.4  9.2   Total Protein 6.0 - 8.3 g/dL   6.0   Total Bilirubin 0.2 - 1.2 mg/dL   0.6   Alkaline Phos 39 - 117 U/L   52   AST 0 - 37 U/L 18   15   ALT 0 - 35 U/L 7   6    ASSESSMENT & PLAN RASHIKA FAUCI is a 89 y.o. female who was referred to [place] for discussion regarding her anticoagulant therapy.  Patient is currently on Eliquis 2.5 mg  twice daily.  She is tolerating this exceptionally well with her only side effect being that it makes her a bit cold.  Recent CBC shows normal hemoglobin.  We had a really long discussion about risks and benefits of chronic anticoagulant use at her age.  I do suspect that she has some type of predisposition to thrombotic events given her multiple DVT/Pes.  Therefore chronic anticoagulation would be advised.  She is on the appropriate dose of Eliquis for her age.  Since patient is doing extremely well for her age she would like to continue on the Eliquis medication given the risk of thrombolic event should she stop this medication.  We did review fall precautions and when to seek medical care in the event of an injury.  We discussed that given her age we will reevaluate the fitness of her Eliquis every 6 months.    All questions were answered. The patient knows to call the clinic with any problems, questions or concerns.  I have spent a total of 30 minutes minutes of face-to-face and non-face-to-face time, preparing to see the patient, obtaining and/or reviewing separately obtained history, performing a medically appropriate examination, counseling and educating the patient, ordering medications/tests/procedures, referring and communicating with other health care professionals, documenting clinical information in the electronic health record, independently interpreting results and communicating results to the patient, and care coordination.    Clent Jacks PA-C Department of Hematology/Oncology [facility] at [facility]

## 2023-02-16 ENCOUNTER — Ambulatory Visit: Payer: Medicare HMO | Admitting: Podiatry

## 2023-02-24 ENCOUNTER — Other Ambulatory Visit: Payer: Self-pay | Admitting: Internal Medicine

## 2023-02-24 NOTE — Telephone Encounter (Signed)
Saw hematology, they recommend long-term anticoagulation, with reassessment every 6 months. RF Eliquis x 6 months

## 2023-02-24 NOTE — Telephone Encounter (Signed)
Okay to refill Eliquis?  

## 2023-02-24 NOTE — Telephone Encounter (Signed)
Rx sent 

## 2023-04-02 ENCOUNTER — Other Ambulatory Visit: Payer: Self-pay | Admitting: Internal Medicine

## 2023-04-05 ENCOUNTER — Ambulatory Visit: Payer: Medicare HMO | Admitting: Podiatry

## 2023-04-05 ENCOUNTER — Encounter: Payer: Self-pay | Admitting: Podiatry

## 2023-04-05 DIAGNOSIS — M79676 Pain in unspecified toe(s): Secondary | ICD-10-CM

## 2023-04-05 DIAGNOSIS — Q828 Other specified congenital malformations of skin: Secondary | ICD-10-CM | POA: Diagnosis not present

## 2023-04-05 DIAGNOSIS — B351 Tinea unguium: Secondary | ICD-10-CM | POA: Diagnosis not present

## 2023-04-05 NOTE — Progress Notes (Signed)
This patient returns to the office for evaluation and treatment of long thick painful nails .  This patient is unable to trim her own nails since the patient cannot reach the feet.  Patient says the nails are painful walking and wearing her shoes.  She has painful callus on the outside of  both her feet..    She  returns for preventive foot care services.   General Appearance  Alert, conversant and in no acute stress.  Vascular  Dorsalis pedis and posterior tibial  pulses are weakly  palpable  bilaterally.  Capillary return is within normal limits  bilaterally. Temperature is within normal limits  bilaterally.  Neurologic  Senn-Weinstein monofilament wire test within normal limits  bilaterally. Muscle power within normal limits bilaterally.  Nails Thick disfigured discolored nails with subungual debris  from hallux to fifth toes bilaterally. No evidence of bacterial infection or drainage bilaterally.  Orthopedic  No limitations of motion  feet .  No crepitus or effusions noted.  No bony pathology or digital deformities noted. HAV  B/L  Skin  normotropic skin  noted bilaterally.  No signs of infections or ulcers noted.   Porokeratosis sub 5th met B/L.  Callus secondary HAV  B/L.  Onychomycosis  Pain in toes right foot  Pain in toes left foot  Porokeratosis sub 5th met B/L.  Debridement  of nails  1-5  B/L with a nail nipper.  Nails were then filed using a dremel tool with no incidents.  Debride porokeratosis with  # 15 blade and dremel tool     RTC 10  weeks   Gregory Mayer DPM  

## 2023-05-04 ENCOUNTER — Telehealth: Payer: Self-pay | Admitting: Pharmacist

## 2023-05-04 ENCOUNTER — Ambulatory Visit (INDEPENDENT_AMBULATORY_CARE_PROVIDER_SITE_OTHER): Payer: Medicare HMO | Admitting: Pharmacist

## 2023-05-04 DIAGNOSIS — I8290 Acute embolism and thrombosis of unspecified vein: Secondary | ICD-10-CM

## 2023-05-04 DIAGNOSIS — Z79899 Other long term (current) drug therapy: Secondary | ICD-10-CM

## 2023-05-04 DIAGNOSIS — I10 Essential (primary) hypertension: Secondary | ICD-10-CM

## 2023-05-04 NOTE — Telephone Encounter (Signed)
Pt returned call. Advised pt that Tammy was on another call at the time and stated a note would be sent back to let her know that she had called back.

## 2023-05-04 NOTE — Telephone Encounter (Signed)
Called back - see phone visit notes.

## 2023-05-04 NOTE — Telephone Encounter (Signed)
Attempted to contact patient for follow up phone visit for medication management. Unable to reach patient. LM on VM with my contact number [phone] or [phone]

## 2023-05-04 NOTE — Progress Notes (Signed)
05/04/2023  Patient ID: Ariel Medina, female   DOB: 1928-07-27, 89 y.o.   MRN: [id number]  Subjective: Ariel Medina is a 89 y.o. year old female who is a primary care patient of Wanda Plump, MD. Clinical Pharmacist Practitioner referral was placed to assist with medication management.    Engaged with patient by telephone for follow up visit today. Patient states that her son died in April 14, 2023. He was 52 years old. She reports she is handling her loss OK.   COPD:  Checking O2 level with pulse oximeter and has been good > 90% Current therapy: Symbicort 2 puffs twice a day; supplemental O2 or albuterol inhaler as needed.  She has not needed to use either her supplemental oxygen or rescue albuterol inhaler in the last 6 months.   Hypertension:  Current therapy: hydrochlorothiazide 12.5mg  once a day and amlodipine 5mg  daily  She has been checking blood pressure a few times per week at home. Reports that blood pressure has been good between 120 and 130 / 65 to 70  BP Readings from Last 3 Encounters:  02/14/23 125/80  01/31/23 126/64  11/01/22 128/82      Hypokalemia:  Current therapy: Potassium 10 mEq once daily. Last potassium was WNL at 4.0 (01/31/2023)   History of bilateral PE and lung nodule with hypermetabolism.  She under went SBRT for 3 treatments to lung lesions. No biopsy has been completed due to risk of age and anticoagulation.   Also has history of GI bleed.  Current anticoagulation therapy: Eliquis 2.5mg  twice a day   She has stopped Eliquis in past in November 2023 due to risk of bleeding and falls. Unfortunately 11/01/2022 she was seen for redness and swelling in her left leg. Ultrasound showed that she had left posterior tibial clot / unprovoked. Started Eliquis 10mg  twice a day for 1 week, then 5mg  twice a day. Dose was decreased based on her age and weight to 2.5mg  twice a day on 12/01/2022.   Patient denies any cost issues with Eliquis.   She denies s/s of  bleeding today - no dark, tarry stools, no nosebleeds. She does report that she does bruise more easily since she restarted Eliquis.   Medication maintenance: patient reports medications are affordable. Most copays are $0. Getting medications from [facility] mail order now and this has been a big help because patient does not drive much. Continues to use her over-the-counter benefits of $50 per quarter for AREDS vitamins.   Objective: Review of patient status, including review of consultants reports, laboratory and other test data, was performed as part of comprehensive evaluation and provision of chronic care management services.   Lab Results  Component Value Date   CREATININE 0.71 01/31/2023   CREATININE 0.64 07/06/2022   CREATININE 0.55 11/27/2021   Lab Results  Component Value Date   NA 138 01/31/2023   K 4.0 01/31/2023   CO2 32 01/31/2023   GLUCOSE 83 01/31/2023   BUN 15 01/31/2023   CREATININE 0.71 01/31/2023   CALCIUM 9.9 01/31/2023   GFR 73.61 01/31/2023   GFRNONAA >60 11/13/2021    No results found for: "HGBA1C"     Component Value Date/Time   CHOL 248 (H) 06/26/2019 0954   TRIG 96.0 06/26/2019 0954   TRIG 77 06/14/2006 0824   HDL 60.60 06/26/2019 0954   CHOLHDL 4 06/26/2019 0954   VLDL 19.2 06/26/2019 0954   LDLCALC 169 (H) 06/26/2019 0954   LDLDIRECT 150.2 06/07/2013 3016  Clinical ASCVD: Yes  The ASCVD Risk score (Arnett DK, et al., 2019) failed to calculate for the following reasons:   The 2019 ASCVD risk score is only valid for ages 38 to 67    BP Readings from Last 3 Encounters:  02/14/23 125/80  01/31/23 126/64  11/01/22 128/82     Allergies  Allergen Reactions   Ibuprofen Other (See Comments)    REACTION: bleed    Medications Reviewed Today     Reviewed by Henrene Pastor, RPH-CPP (Pharmacist) on 05/04/23 at 1639  Med List Status: <None>   Medication Order Taking? Sig Documenting Provider Last Dose Status Informant  albuterol (VENTOLIN  HFA) 108 (90 Base) MCG/ACT inhaler [id number] Yes Inhale 2 puffs into the lungs every 6 (six) hours as needed for wheezing or shortness of breath. Glade Lloyd, MD Taking Active   amLODipine (NORVASC) 5 MG tablet [id number] Yes Take 1 tablet (5 mg total) by mouth daily. Wanda Plump, MD Taking Active   apixaban [facility]) 2.5 MG TABS tablet [id number] Yes Take 1 tablet (2.5 mg total) by mouth 2 (two) times daily. Wanda Plump, MD Taking Active   budesonide-formoterol [facility]) 160-4.5 MCG/ACT inhaler [id number] Yes Inhale 2 puffs into the lungs 2 (two) times daily. Wanda Plump, MD Taking Active   cyclobenzaprine (FLEXERIL) 5 MG tablet [id number] Yes Take 1 tablet (5 mg total) by mouth 2 (two) times daily as needed for muscle spasms. Wanda Plump, MD Taking Active   fluticasone [facility]) 50 MCG/ACT nasal spray [id number] Yes Place 2 sprays into both nostrils daily as needed for allergies or rhinitis. [name], [name], NP Taking Active Self  hydrochlorothiazide (HYDRODIURIL) 12.5 MG tablet [id number] Yes Take 1 tablet (12.5 mg total) by mouth daily. Wanda Plump, MD Taking Active   levothyroxine (SYNTHROID) 75 MCG tablet [id number] Yes Take 1 tablet (75 mcg total) by mouth daily before breakfast. Wanda Plump, MD Taking Active   loperamide (IMODIUM) 2 MG capsule [id number] Yes Take 1 capsule (2 mg total) by mouth daily as needed for diarrhea or loose stools. Wanda Plump, MD Taking Active            Med Note (CANTER, Vicente Males D   Tue Jul 06, 2022  9:22 AM) PRN  Multiple Vitamins-Minerals (PRESERVISION AREDS) TABS [id number] Yes Take 1 tablet by mouth in the morning and at bedtime. [provider] Taking Active Self  potassium chloride Jerene Dilling) 10 MEQ tablet [id number] Yes TAKE 1 TABLET EVERY DAY Eulis Foster, FNP Taking Active   Med List Note Nolon Bussing, CPhT 05/08/20 [id number]): [facility]            Patient Active Problem List   Diagnosis Date Noted   COPD (chronic obstructive  pulmonary disease) (HCC) 02/26/2022   Lung mass 11/08/2021   Acute hypoxemic respiratory failure (HCC) 11/08/2021   Acute respiratory failure with hypoxia (HCC) 11/07/2021   Cough 11/07/2021   Chronic diarrhea 11/07/2021   Solitary pulmonary nodule 07/09/2020   Abnormal findings on diagnostic imaging of lung 05/30/2020   Acute deep vein thrombosis (DVT) of proximal vein of both lower extremities (HCC) 05/30/2020   VTE (venous thromboembolism) 05/30/2020   Goals of care, counseling/discussion    Palliative care by specialist    DNR (do not resuscitate) discussion    Intussusception (HCC) 04/29/2020   Pulmonary embolism, bilateral (HCC) 04/29/2020   Hypoxia 04/29/2020   Pre-ulcerative calluses 01/25/2020   Essential hypertension 11/29/2016   Closed fracture  of multiple pubic rami (HCC) 11/29/2016   Fall    Closed fracture of left pubis (HCC) 11/28/2016   Hypokalemia 11/28/2016   Hearing loss 09/19/2015   PCP NOTES >>> 05/13/2015   Allergic rhinitis 10/24/2014   Annual physical exam 04/14/2011   BACK PAIN, shoulder pain 12/11/2007   Osteoporosis 10/20/2007   NEOPLASM, MALIGNANT, BREAST, HX OF 10/20/2007   Hypothyroidism 01/30/2007   Hyperlipidemia 01/30/2007   HTN  01/30/2007   OSTEOARTHRITIS 01/30/2007     Medication Assistance:  None required.  Patient affirms current coverage meets needs.   Assessment / Plan: COPD: Controlled Continue Symbicort 2 puffs twice a day.  Continue to use supplemental O2 and albuterol as needed.  Continue to check O2 sats at home - contact office if < 90% Hypertension: Controlled Continue to check blood pressure at home 1 to 2 times per week. Recommended patient record blood pressure readings Reviewed blood pressure goals Continue hydrochlorothiazide 12.5mg  every other day and amlodipine 5mg  daily.  History of bilateral PE and recent unprovoked left tibial clot:  Reviewed s/s of bleeding. Patient reminded to contact office if she notices s/s  of bleeding.  Continue Eliquis 2.5mg  twice a day  Hypokalemia:  Continue potassium 10 mEq once daily.   Follow Up:  No follow up  needed at this time but patient or PCP can contact Clinical Pharmacist Practitioner if any future medication needs.    Henrene Pastor, PharmD Clinical Pharmacist [facility]  - [facility] [phone]

## 2023-05-08 ENCOUNTER — Other Ambulatory Visit: Payer: Self-pay | Admitting: Family

## 2023-05-08 ENCOUNTER — Other Ambulatory Visit: Payer: Self-pay | Admitting: Internal Medicine

## 2023-06-01 ENCOUNTER — Other Ambulatory Visit: Payer: Self-pay | Admitting: Internal Medicine

## 2023-06-01 DIAGNOSIS — E039 Hypothyroidism, unspecified: Secondary | ICD-10-CM

## 2023-06-03 ENCOUNTER — Ambulatory Visit (INDEPENDENT_AMBULATORY_CARE_PROVIDER_SITE_OTHER): Payer: Medicare HMO | Admitting: Internal Medicine

## 2023-06-03 ENCOUNTER — Encounter: Payer: Self-pay | Admitting: Internal Medicine

## 2023-06-03 ENCOUNTER — Ambulatory Visit: Payer: Medicare HMO | Admitting: Internal Medicine

## 2023-06-03 VITALS — BP 124/66 | HR 66 | Temp 97.9°F | Resp 16 | Ht 62.0 in | Wt 132.0 lb

## 2023-06-03 DIAGNOSIS — M8000XS Age-related osteoporosis with current pathological fracture, unspecified site, sequela: Secondary | ICD-10-CM | POA: Diagnosis not present

## 2023-06-03 DIAGNOSIS — I10 Essential (primary) hypertension: Secondary | ICD-10-CM

## 2023-06-03 DIAGNOSIS — E039 Hypothyroidism, unspecified: Secondary | ICD-10-CM

## 2023-06-03 DIAGNOSIS — Z23 Encounter for immunization: Secondary | ICD-10-CM

## 2023-06-03 DIAGNOSIS — Z0000 Encounter for general adult medical examination without abnormal findings: Secondary | ICD-10-CM | POA: Diagnosis not present

## 2023-06-03 DIAGNOSIS — Z0001 Encounter for general adult medical examination with abnormal findings: Secondary | ICD-10-CM

## 2023-06-03 DIAGNOSIS — Z7901 Long term (current) use of anticoagulants: Secondary | ICD-10-CM

## 2023-06-03 MED ORDER — IBANDRONATE SODIUM 150 MG PO TABS: 150.0000 mg | ORAL_TABLET | ORAL | 6 refills | Status: DC

## 2023-06-03 NOTE — Progress Notes (Signed)
Subjective:    Patient ID: Ariel Medina, female    DOB: [birth year], 89 y.o.   MRN: [id number]  DOS:  06/03/2023 Type of visit - description: CPX, here with her sister  In general feeling well. She actually has no concerns or than "I cannot walk as much as before, I think is my age" No recent falls.  Review of Systems  Other than above, a 14 point review of systems is negative     Past Medical History:  Diagnosis Date   Anemia    Breast CA (HCC)    left breast-invasive ductal ca stage 1-stopped arimidex 2008   COPD (chronic obstructive pulmonary disease) (HCC) 02/26/2022   Diverticulitis of colon    GI bleed 04/2020   Hiatal hernia    History of radiation therapy 08/05/2020   56 Gray in 3 fractions to the left upper lung nodule 07/29/2020-08/05/2020.   Dr Antony Blackbird   Hyperlipidemia    Hypertension    Hypothyroidism    Intussusception of intestine (HCC) 2021   Lung mass    Osteoarthritis    Osteopenia    DEXA 7/02   Pulmonary embolism (HCC)/DVT    Syncope 2005   CT head (-), ECHO essent. neg, stress test (-), carotid u/s (-)    Past Surgical History:  Procedure Laterality Date   BREAST BIOPSY Bilateral    BREAST LUMPECTOMY  04/2002   left   PARTIAL COLECTOMY N/A 05/01/2020   Procedure: PARTIAL COLECTOMY;  Surgeon: Abigail Miyamoto, MD;  Location: [facility] OR;  Service: General;  Laterality: N/A;   TONSILLECTOMY AND ADENOIDECTOMY     Social History   Socioeconomic History   Marital status: Widowed    Spouse name: Not on file   Number of children: 2   Years of education: Not on file   Highest education level: Not on file  Occupational History   Occupation: n/a    Employer: RETIRED  Tobacco Use   Smoking status: Never   Smokeless tobacco: Never  Vaping Use   Vaping status: Never Used  Substance and Sexual Activity   Alcohol use: No   Drug use: No   Sexual activity: Not Currently  Other Topics Concern   Not on file  Social History Narrative   Lost a  son 61   Widow since 40   G-son  live w/ her from time to time, not permanently   G-daughter Ariel Medina lives 15 min away    Sister live next door , does help the pt    G-son @ [employer] , G-daughter @ [employer]    still drives, short distances, typically not at night.   Social Determinants of Health   Financial Resource Strain: Low Risk  (08/04/2022)   Overall Financial Resource Strain (CARDIA)    Difficulty of Paying Living Expenses: Not very hard  Food Insecurity: No Food Insecurity (08/04/2022)   Hunger Vital Sign    Worried About Running Out of Food in the Last Year: Never true    Ran Out of Food in the Last Year: Never true  Transportation Needs: No Transportation Needs (08/04/2022)   PRAPARE - Administrator, Civil Service (Medical): No    Lack of Transportation (Non-Medical): No  Physical Activity: Insufficiently Active (08/04/2022)   Exercise Vital Sign    Days of Exercise per Week: 5 days    Minutes of Exercise per Session: 10 min  Stress: No Stress Concern Present (08/04/2022)   [employer] of Occupational  Health - Occupational Stress Questionnaire    Feeling of Stress : Not at all  Social Connections: Moderately Isolated (08/04/2022)   Social Connection and Isolation Panel [NHANES]    Frequency of Communication with Friends and Family: More than three times a week    Frequency of Social Gatherings with Friends and Family: More than three times a week    Attends Religious Services: More than 4 times per year    Active Member of [employer] or Organizations: No    Attends Banker Meetings: Never    Marital Status: Widowed  Intimate Partner Violence: Not At Risk (08/04/2022)   Humiliation, Afraid, Rape, and Kick questionnaire    Fear of Current or Ex-Partner: No    Emotionally Abused: No    Physically Abused: No    Sexually Abused: No     Current Outpatient Medications  Medication Instructions   albuterol (VENTOLIN HFA) 108 (90 Base)  MCG/ACT inhaler 2 puffs, Inhalation, Every 6 hours PRN   amLODipine (NORVASC) 5 mg, Oral, Daily   apixaban (ELIQUIS) 2.5 mg, Oral, 2 times daily   budesonide-formoterol (SYMBICORT) 160-4.5 MCG/ACT inhaler 2 puffs, Inhalation, 2 times daily   fluticasone (FLONASE) 50 MCG/ACT nasal spray 2 sprays, Each Nare, Daily PRN   hydrochlorothiazide (HYDRODIURIL) 12.5 mg, Oral, Daily   ibandronate (BONIVA) 150 mg, Oral, Every 30 days, Take in the morning with a full glass of water, on an empty stomach, and do not take anything else by mouth or lie down for the next 30 min.   levothyroxine (SYNTHROID) 75 mcg, Oral, Daily before breakfast   loperamide (IMODIUM) 2 mg, Oral, Daily PRN   Multiple Vitamins-Minerals (PRESERVISION AREDS) TABS 1 tablet, Oral, 2 times daily   potassium chloride (KLOR-CON M) 10 MEQ tablet TAKE 1 TABLET EVERY DAY       Objective:   Physical Exam BP 124/66   Pulse 66   Temp 97.9 F (36.6 C) (Oral)   Resp 16   Ht 5\' 2"  (1.575 m)   Wt 132 lb (59.9 kg)   SpO2 97%   BMI 24.14 kg/m  General:   Well developed, NAD, BMI noted.  HEENT:  Normocephalic . Face symmetric, atraumatic Lungs:  CTA B Normal respiratory effort, no intercostal retractions, no accessory muscle use. Heart: RRR,  no murmur.  Abdomen:  Not distended, soft, non-tender. No rebound or rigidity.   Skin: Not pale. Not jaundice Lower extremities: Trace pretibial edema bilaterally  Neurologic:  alert & oriented X3.  Speech normal, gait appropriate for age assisted by a cane Psych--  Cognition and judgment appear intact.  Cooperative with normal attention span and concentration.  Behavior appropriate. No anxious or depressed appearing.     Assessment    Assessment  HTN Hyperlipidemia.  Off statins per patient choice, likes to minimize medicines Hypothyroidism Osteopenia:  --T score -2.1 (04-2012) DC Fosamax 12/2013 after 5 years --T score -1.5   (02-2015)  --T score -3.6 (april 2019) on  Prolia DJD GERD  Tremors, likely essential H/o diverticulitis Breast cancer, L breast, release from oncology 2013 11-2016: Fall, FX L pubic ramus, admitted then d/c to a NH, back home 12-18-16 Lung mass:No biopsy, suspected to be slow growing NSCLC ,s/p XRT (07-2020) Hematology: ---Edema, R>L U/S (-) for DVT 01/2017  --B DVT and PE 04-2020 in the context of an acute admission to the hospital --DVT, left posterior tibial vein, unprovoked, 10-2022, Rx Eliquis   PLAN: Here for CPX -Td 2019 - pneumonia shot 02-2008,  2019; prevnar : 2015. - s/p shingrix , s/p RSV  -Flu shot today -Recommend COVID booster at her convenience -No further cancer screenings. - Has a healthcare power of attorney. Social: Still living independently, typically family members stay with her at night or live with her temporarily.  Still drives short distances. Labs:  BMP TSH Has a healthcare POA. DVT 10/2022, DVT-PE 2021:  saw hematology, they felt that staying on age-adjusted Eliquis would be appropriate for now.  No change. HTN: BP is very good, continue amlodipine, HCTZ, potassium.  Check a BMP. Hypothyroidism: Check TSH. Osteoporosis: Had a number of complicated medical issues, consequently last Prolia was 10/2020.  She is really not interested on shots but would agreeable to oral medicines.  She is able to swallow without difficulties. Plan: Boniva 150 mg  tablet monthly.  Precautions discussed again. RTC 4 months.

## 2023-06-03 NOTE — Patient Instructions (Addendum)
Vaccines I recommend: Covid booster  Take Boniva 150 mg: 1 tablet once a month. Take it for sitting in the morning, on an empty stomach with 2 glasses of water. Stay upright for 40 minutes. After 40 minutes you can eat. Call anytime if you have side effects or problems.       GO TO THE LAB : Get the blood work     Next visit with me in 4 months for a checkup please schedule it at the front desk

## 2023-06-03 NOTE — Assessment & Plan Note (Signed)
Here for CPX DVT 10/2022, DVT-PE 2021:  saw hematology, they felt that staying on age-adjusted Eliquis would be appropriate for now.  No change. HTN: BP is very good, continue amlodipine, HCTZ, potassium.  Check a BMP. Hypothyroidism: Check TSH. Osteoporosis: Had a number of complicated medical issues, consequently last Prolia was 10/2020.  She is really not interested on shots but would agreeable to oral medicines.  She is able to swallow without difficulties. Plan: Boniva 150 mg  tablet monthly.  Precautions discussed again. RTC 4 months.

## 2023-06-03 NOTE — Assessment & Plan Note (Signed)
Here for CPX -Td 2019 - pneumonia shot 02-2008, 2019; prevnar : 2015. - s/p shingrix , s/p RSV  -Flu shot today -Recommend COVID booster at her convenience -No further cancer screenings. - Has a healthcare power of attorney. Social: Still living independently, typically family members stay with her at night or live with her temporarily.  Still drives short distances. Labs:  BMP TSH Has a healthcare POA.

## 2023-06-04 LAB — BASIC METABOLIC PANEL
BUN: 13 mg/dL (ref 7–25)
CO2: 32 mmol/L (ref 20–32)
Calcium: 9.8 mg/dL (ref 8.6–10.4)
Chloride: 99 mmol/L (ref 98–110)
Creat: 0.73 mg/dL (ref 0.60–0.95)
Glucose, Bld: 91 mg/dL (ref 65–99)
Potassium: 4 mmol/L (ref 3.5–5.3)
Sodium: 140 mmol/L (ref 135–146)

## 2023-06-04 LAB — TSH: TSH: 3.18 m[IU]/L (ref 0.40–4.50)

## 2023-06-13 ENCOUNTER — Ambulatory Visit: Payer: Medicare HMO | Admitting: Podiatry

## 2023-06-13 ENCOUNTER — Encounter: Payer: Self-pay | Admitting: Podiatry

## 2023-06-13 DIAGNOSIS — B351 Tinea unguium: Secondary | ICD-10-CM

## 2023-06-13 DIAGNOSIS — M79676 Pain in unspecified toe(s): Secondary | ICD-10-CM | POA: Diagnosis not present

## 2023-06-13 NOTE — Progress Notes (Signed)
This patient returns to the office for evaluation and treatment of long thick painful nails .  This patient is unable to trim her own nails since the patient cannot reach the feet.  Patient says the nails are painful walking and wearing her shoes.      She  returns for preventive foot care services.   General Appearance  Alert, conversant and in no acute stress.  Vascular  Dorsalis pedis and posterior tibial  pulses are weakly  palpable  bilaterally.  Capillary return is within normal limits  bilaterally. Temperature is within normal limits  bilaterally.  Neurologic  Senn-Weinstein monofilament wire test within normal limits  bilaterally. Muscle power within normal limits bilaterally.  Nails Thick disfigured discolored nails with subungual debris  from hallux to fifth toes bilaterally. No evidence of bacterial infection or drainage bilaterally.  Orthopedic  No limitations of motion  feet .  No crepitus or effusions noted.  No bony pathology or digital deformities noted. HAV  B/L  Skin  normotropic skin  noted bilaterally.  No signs of infections or ulcers noted.     Onychomycosis  Pain in toes right foot  Pain in toes left foot    Debridement  of nails  1-5  B/L with a nail nipper.  Nails were then filed using a dremel tool with no incidents.   RTC 10  weeks   Helane Gunther DPM

## 2023-07-14 ENCOUNTER — Encounter: Payer: Self-pay | Admitting: Podiatry

## 2023-07-26 ENCOUNTER — Telehealth: Payer: Self-pay | Admitting: Internal Medicine

## 2023-07-26 NOTE — Telephone Encounter (Signed)
Copied from CRM [id number]. Topic: Medicare AWV >> Jul 26, 2023 11:30 AM Payton Doughty wrote: Reason for CRM: Called 07/26/2023 to sched Annual Wellness Visit - NO VOICEMAIL  Verlee Rossetti; Care Guide Ambulatory Clinical Support [facility] Direct Dial: [phone]

## 2023-07-31 ENCOUNTER — Other Ambulatory Visit: Payer: Self-pay | Admitting: Internal Medicine

## 2023-08-01 ENCOUNTER — Other Ambulatory Visit: Payer: Self-pay | Admitting: Internal Medicine

## 2023-08-07 ENCOUNTER — Other Ambulatory Visit: Payer: Self-pay | Admitting: Internal Medicine

## 2023-08-07 DIAGNOSIS — E039 Hypothyroidism, unspecified: Secondary | ICD-10-CM

## 2023-08-16 ENCOUNTER — Other Ambulatory Visit: Payer: Self-pay | Admitting: Medical Oncology

## 2023-08-16 ENCOUNTER — Inpatient Hospital Stay: Payer: Medicare HMO | Attending: Medical Oncology | Admitting: Medical Oncology

## 2023-08-16 ENCOUNTER — Inpatient Hospital Stay: Payer: Medicare HMO

## 2023-08-16 DIAGNOSIS — Z7901 Long term (current) use of anticoagulants: Secondary | ICD-10-CM

## 2023-08-25 ENCOUNTER — Ambulatory Visit: Payer: Medicare HMO | Admitting: Podiatry

## 2023-08-28 ENCOUNTER — Other Ambulatory Visit: Payer: Self-pay | Admitting: Internal Medicine

## 2023-09-16 ENCOUNTER — Ambulatory Visit: Payer: Self-pay | Admitting: Internal Medicine

## 2023-09-16 NOTE — Telephone Encounter (Signed)
 Chief Complaint: cough Symptoms: cough, congestion, runny nose, wheezing Frequency: 1 week Pertinent Negatives: Patient denies SOB, CP, fever Disposition: [] ED /[] Urgent Care (no appt availability in office) / [x] Appointment(In office/virtual)/ []  [facility]/ [] Home Care/ [x] Refused Recommended Disposition /[] [facility]/ []  Follow-up with PCP Additional Notes: Patient calls reporting dry hacking cough x 1 week, runny nose, congestion. Patient denies SOB stating nothing out of the ordinary this RN can hear audible wheezing intermittently during call. Per protocol, patient to be evaluated within 4 hours. Patient declines visit, states I just don't feel like coming out. Patient does not have ability to complete virtual visit. Patient is requesting antibiotic be called in and asking for call back. Care advice reviewed with patient. Alerting PCP for review and follow up.    Copied from CRM [id number]. Topic: Clinical - Medication Refill >> Sep 16, 2023 11:00 AM Deidre DASEN wrote: Most Recent Primary Care Visit:  Provider: AMON SCHANZ E  Department: [facility]  Visit Type: PHYSICAL  Date: 06/03/2023  Medication: patient is requesting a antibiotic   Has the patient contacted their pharmacy? No (Agent: If no, request that the patient contact the pharmacy for the refill. If patient does not wish to contact the pharmacy document the reason why and proceed with request.) (Agent: If yes, when and what did the pharmacy advise?)  Is this the correct pharmacy for this prescription? Yes If no, delete pharmacy and type the correct one.  This is the patient's preferred pharmacy:  [facility] [address] [place], [place] - [address] [place] [address] Phone: [phone] Fax: [phone]  [facility] - [place], [place] - [address] [place] [address] Phone: [phone] Fax:  [phone]   Has the prescription been filled recently? No  Is the patient out of the medication? Yes  Has the patient been seen for an appointment in the last year OR does the patient have an upcoming appointment? Yes  Can we respond through MyChart? No  Agent: Please be advised that Rx refills may take up to 3 business days. We ask that you follow-up with your pharmacy. Reason for Disposition  [1] MILD difficulty breathing (e.g., minimal/no SOB at rest, SOB with walking, pulse <100) AND [2] still present when not coughing  Answer Assessment - Initial Assessment Questions 1. ONSET: When did the cough begin?      Dry hacking cough that began 1 week ago 2. SEVERITY: How bad is the cough today?      Mild can't shake it 3. SPUTUM: Describe the color of your sputum (none, dry cough; clear, white, yellow, green)     No color when there is phlegm, dry hacking 4. HEMOPTYSIS: Are you coughing up any blood? If so ask: How much? (flecks, streaks, tablespoons, etc.)     Denies 5. DIFFICULTY BREATHING: Are you having difficulty breathing? If Yes, ask: How bad is it? (e.g., mild, moderate, severe)    - MILD: No SOB at rest, mild SOB with walking, speaks normally in sentences, can lie down, no retractions, pulse < 100.    - MODERATE: SOB at rest, SOB with minimal exertion and prefers to sit, cannot lie down flat, speaks in phrases, mild retractions, audible wheezing, pulse 100-120.    - SEVERE: Very SOB at rest, speaks in single words, struggling to breathe, sitting hunched forward, retractions, pulse > 120      Denies- states nothing out of  the ordinary- This RN can hear audible wheezing intermittently during call. 6. FEVER: Do you have a fever? If Yes, ask: What is your temperature, how was it measured, and when did it start?     Denies 7. CARDIAC HISTORY: Do you have any history of heart disease? (e.g., heart attack, congestive heart failure)      Denies 8. LUNG HISTORY:  Do you have any history of lung disease?  (e.g., pulmonary embolus, asthma, emphysema)     COPD per chart 9. PE RISK FACTORS: Do you have a history of blood clots? (or: recent major surgery, recent prolonged travel, bedridden)     Yes 10. OTHER SYMPTOMS: Do you have any other symptoms? (e.g., runny nose, wheezing, chest pain)       Runny nose, congestion  Protocols used: Cough - Acute Non-Productive-A-AH

## 2023-09-16 NOTE — Telephone Encounter (Signed)
 Needs appt please.

## 2023-09-19 ENCOUNTER — Ambulatory Visit: Payer: Self-pay | Admitting: Internal Medicine

## 2023-09-19 MED ORDER — AZITHROMYCIN 250 MG PO TABS
ORAL_TABLET | ORAL | 0 refills | Status: DC
Start: 2023-09-19 — End: 2023-10-04

## 2023-09-19 NOTE — Telephone Encounter (Signed)
Spoke w/ Pt- informed of recommendations. Pt verbalized understanding.  

## 2023-09-19 NOTE — Telephone Encounter (Signed)
 Patient is 41, having respiratory symptoms for 10 days, has refused ER, has a very hard time coming to the office. At this point, risk of antibiotic is less than potential for benefit.  Advised patient: Robitussin DM Rest Fluids Send a Z-Pak 911 if getting much worse. Symbicort  twice daily and use albuterol  prn for cough, shortness of breath or wheezing.

## 2023-09-19 NOTE — Addendum Note (Signed)
 Addended by: Esmerelda Finnigan D on: 09/19/2023 04:31 PM   Modules accepted: Orders

## 2023-09-19 NOTE — Telephone Encounter (Signed)
 Summary: persistent cough, cold symptoms over a week. previously triaged wants antibiotics   Copied From CRM [id number]. Reason for Triage: patient called in and was triaged 09/16/23, she called back today to see if there was any cancellations, there was not, patient stated she thinks an antibiotic would help her symptoms that have been present for over a week, her cough is making her uncomfortable. She stated due to her age, she has a very hard time leaving home and traveling. Patient would like a call back at [phone]          Chief Complaint: Cough Symptoms: moderate shortness of breath, cough, diarrhea Frequency: constant Pertinent Negatives: Patient denies fever Disposition: [x] ED /[] Urgent Care (no appt availability in office) / [] Appointment(In office/virtual)/ []  [facility]/ [] Home Care/ [x] Refused Recommended Disposition /[] [facility]/ []  Follow-up with PCP Additional Notes: Pt reports productive cough for over a week, unsure of sputum or phlegm color, states that she swallows it back down. Patient also endorses moderate shortness of breath, worse when she coughs. Pt reports diarrhea as well. Pt has hx of DVT, taking eliquis . RN advising ED. Pt refusing, call placed to [facility]. Spoke with Alston Jerry, who advised to route [facility] and someone follow-up with the patient today. Pt made aware, verbalized understanding  Reason for Disposition  [1] MODERATE difficulty breathing (e.g., speaks in phrases, SOB even at rest, pulse 100-120) AND [2] still present when not coughing  Answer Assessment - Initial Assessment Questions 1. ONSET: "When did the cough begin?"     Approx 10 days  2. SEVERITY: "How bad is the cough today?"      Getting worse  3. SPUTUM: "Describe the color of your sputum" (none, dry cough; clear, white, yellow, green)     Unsure of color, states that she swallows it  4. HEMOPTYSIS: "Are you coughing up any blood?" If so ask: "How much?" (flecks, streaks,  tablespoons, etc.)     No  5. DIFFICULTY BREATHING: "Are you having difficulty breathing?" If Yes, ask: "How bad is it?" (e.g., mild, moderate, severe)    - MILD: No SOB at rest, mild SOB with walking, speaks normally in sentences, can lie down, no retractions, pulse < 100.    - MODERATE: SOB at rest, SOB with minimal exertion and prefers to sit, cannot lie down flat, speaks in phrases, mild retractions, audible wheezing, pulse 100-120.    - SEVERE: Very SOB at rest, speaks in single words, struggling to breathe, sitting hunched forward, retractions, pulse > 120      Moderate shortness of breath  6. FEVER: "Do you have a fever?" If Yes, ask: "What is your temperature, how was it measured, and when did it start?"     Unsure  7. CARDIAC HISTORY: "Do you have any history of heart disease?" (e.g., heart attack, congestive heart failure)      Hypertension  8. LUNG HISTORY: "Do you have any history of lung disease?"  (e.g., pulmonary embolus, asthma, emphysema)     No  9. PE RISK FACTORS: "Do you have a history of blood clots?" (or: recent major surgery, recent prolonged travel, bedridden)     DVT in 01/2023  10. OTHER SYMPTOMS: "Do you have any other symptoms?" (e.g., runny nose, wheezing, chest pain)       Sore throat, diarrhea  11. PREGNANCY: "Is there any chance you are pregnant?" "When was your last menstrual period?"       No  12. TRAVEL: "Have you traveled  out of the country in the last month?" (e.g., travel history, exposures)       No  Protocols used: Cough - Acute Productive-A-AH

## 2023-09-20 ENCOUNTER — Other Ambulatory Visit: Payer: Self-pay

## 2023-09-20 MED ORDER — HYDROCHLOROTHIAZIDE 12.5 MG PO TABS: 12.5000 mg | ORAL_TABLET | Freq: Every day | ORAL | 1 refills | Status: DC

## 2023-09-27 DIAGNOSIS — H353132 Nonexudative age-related macular degeneration, bilateral, intermediate dry stage: Secondary | ICD-10-CM | POA: Diagnosis not present

## 2023-09-27 DIAGNOSIS — D3132 Benign neoplasm of left choroid: Secondary | ICD-10-CM | POA: Diagnosis not present

## 2023-09-27 DIAGNOSIS — H524 Presbyopia: Secondary | ICD-10-CM | POA: Diagnosis not present

## 2023-09-27 DIAGNOSIS — H5213 Myopia, bilateral: Secondary | ICD-10-CM | POA: Diagnosis not present

## 2023-09-27 DIAGNOSIS — H52223 Regular astigmatism, bilateral: Secondary | ICD-10-CM | POA: Diagnosis not present

## 2023-09-27 DIAGNOSIS — H2513 Age-related nuclear cataract, bilateral: Secondary | ICD-10-CM | POA: Diagnosis not present

## 2023-09-27 DIAGNOSIS — D3131 Benign neoplasm of right choroid: Secondary | ICD-10-CM | POA: Diagnosis not present

## 2023-10-04 ENCOUNTER — Ambulatory Visit (INDEPENDENT_AMBULATORY_CARE_PROVIDER_SITE_OTHER): Payer: Medicare HMO | Admitting: Internal Medicine

## 2023-10-04 ENCOUNTER — Encounter: Payer: Self-pay | Admitting: Internal Medicine

## 2023-10-04 VITALS — BP 122/70 | HR 70 | Temp 97.9°F | Ht 62.0 in | Wt 129.6 lb

## 2023-10-04 DIAGNOSIS — E039 Hypothyroidism, unspecified: Secondary | ICD-10-CM | POA: Diagnosis not present

## 2023-10-04 DIAGNOSIS — I10 Essential (primary) hypertension: Secondary | ICD-10-CM

## 2023-10-04 LAB — CBC WITH DIFFERENTIAL/PLATELET
Basophils Absolute: 0 10*3/uL (ref 0.0–0.1)
Basophils Relative: 0.3 % (ref 0.0–3.0)
Eosinophils Absolute: 0 10*3/uL (ref 0.0–0.7)
Eosinophils Relative: 0.6 % (ref 0.0–5.0)
HCT: 44.8 % (ref 36.0–46.0)
Hemoglobin: 14.5 g/dL (ref 12.0–15.0)
Lymphocytes Relative: 18.8 % (ref 12.0–46.0)
Lymphs Abs: 1.3 10*3/uL (ref 0.7–4.0)
MCHC: 32.3 g/dL (ref 30.0–36.0)
MCV: 96.5 fL (ref 78.0–100.0)
Monocytes Absolute: 0.6 10*3/uL (ref 0.1–1.0)
Monocytes Relative: 9.2 % (ref 3.0–12.0)
Neutro Abs: 4.9 10*3/uL (ref 1.4–7.7)
Neutrophils Relative %: 71.1 % (ref 43.0–77.0)
Platelets: 271 10*3/uL (ref 150.0–400.0)
RBC: 4.65 Mil/uL (ref 3.87–5.11)
RDW: 14.4 % (ref 11.5–15.5)
WBC: 6.9 10*3/uL (ref 4.0–10.5)

## 2023-10-04 LAB — BASIC METABOLIC PANEL
BUN: 16 mg/dL (ref 6–23)
CO2: 33 meq/L — ABNORMAL HIGH (ref 19–32)
Calcium: 9.6 mg/dL (ref 8.4–10.5)
Chloride: 98 meq/L (ref 96–112)
Creatinine, Ser: 0.64 mg/dL (ref 0.40–1.20)
GFR: 76.15 mL/min (ref >60.00)
Glucose, Bld: 71 mg/dL (ref 70–99)
Potassium: 4.5 meq/L (ref 3.5–5.1)
Sodium: 139 meq/L (ref 135–145)

## 2023-10-04 LAB — TSH: TSH: 3.46 u[IU]/mL (ref 0.35–5.50)

## 2023-10-04 NOTE — Progress Notes (Unsigned)
 Subjective:    Patient ID: Ariel Medina, female    DOB: September 15, 1927, 89 y.o.   MRN: [id number]  DOS:  10/04/2023 Type of visit - description: Routine checkup, here with her sister  Overall feeling well Has no major concerns. No recent falls. Occasional nosebleeds lately. Recently called respiratory symptoms, got a zpack, feeling  better. Review of Systems See above   Past Medical History:  Diagnosis Date   Anemia    Breast CA (HCC)    left breast-invasive ductal ca stage 1-stopped arimidex 2008   COPD (chronic obstructive pulmonary disease) (HCC) 02/26/2022   Diverticulitis of colon    GI bleed 04/2020   Hiatal hernia    History of radiation therapy 08/05/2020   54 Gray in 3 fractions to the left upper lung nodule 07/29/2020-08/05/2020.   Dr Antony Blackbird   Hyperlipidemia    Hypertension    Hypothyroidism    Intussusception of intestine (HCC) 2021   Lung mass    Osteoarthritis    Osteopenia    DEXA 7/02   Pulmonary embolism (HCC)/DVT    Syncope 2005   CT head (-), ECHO essent. neg, stress test (-), carotid u/s (-)    Past Surgical History:  Procedure Laterality Date   BREAST BIOPSY Bilateral    BREAST LUMPECTOMY  04/2002   left   PARTIAL COLECTOMY N/A 05/01/2020   Procedure: PARTIAL COLECTOMY;  Surgeon: Abigail Miyamoto, MD;  Location: [facility] OR;  Service: General;  Laterality: N/A;   TONSILLECTOMY AND ADENOIDECTOMY      Current Outpatient Medications  Medication Instructions   albuterol (VENTOLIN HFA) 108 (90 Base) MCG/ACT inhaler 2 puffs, Inhalation, Every 6 hours PRN   amLODipine (NORVASC) 5 mg, Oral, Daily   apixaban (ELIQUIS) 2.5 mg, Oral, 2 times daily   azithromycin (ZITHROMAX) 250 MG tablet Take 2 tablets by mouth first day, then 1 tablet for 4 additional days   budesonide-formoterol (SYMBICORT) 160-4.5 MCG/ACT inhaler 2 puffs, Inhalation, 2 times daily   fluticasone (FLONASE) 50 MCG/ACT nasal spray 2 sprays, Each Nare, Daily PRN   hydrochlorothiazide  (HYDRODIURIL) 12.5 mg, Oral, Daily   ibandronate (BONIVA) 150 mg, Oral, Every 30 days, Take in the morning with a full glass of water, on an empty stomach, and do not take anything else by mouth or lie down for the next 30 min.   levothyroxine (SYNTHROID) 75 mcg, Oral, Daily before breakfast   loperamide (IMODIUM) 2 mg, Oral, Daily PRN   Multiple Vitamins-Minerals (PRESERVISION AREDS) TABS 1 tablet, 2 times daily   potassium chloride (KLOR-CON M) 10 MEQ tablet TAKE 1 TABLET EVERY DAY       Objective:   Physical Exam BP 122/70   Pulse 70   Temp 97.9 F (36.6 C)   Ht 5\' 2"  (1.575 m)   Wt 129 lb 9.6 oz (58.8 kg)   SpO2 97%   BMI 23.70 kg/m  General:   Well developed, NAD, BMI noted. HEENT:  Normocephalic . Face symmetric, atraumatic Lungs:  CTA B Normal respiratory effort, no intercostal retractions, no accessory muscle use. Heart: RRR,  no murmur.  Lower extremities: Some edema on the right, at baseline. Skin: Not pale. Not jaundice Neurologic:  alert & oriented X3.  Speech normal, gait assisted by cane, small shuffling steps  psych--  Cognition and judgment appear intact.  Cooperative with normal attention span and concentration.  Behavior appropriate. No anxious or depressed appearing.      Assessment     Assessment  HTN Hyperlipidemia.  Off statins per patient choice, likes to minimize medicines Hypothyroidism Osteopenia:  --T score -2.1 (04-2012) DC Fosamax 12/2013 after 5 years --T score -1.5   (02-2015)  --T score -3.6 (april 2019) on Prolia DJD GERD  Tremors, likely essential H/o diverticulitis Breast cancer, L breast, release from oncology 2013 11-2016: Fall, FX L pubic ramus, admitted then d/c to a NH, back home 12-18-16 Lung mass:No biopsy, suspected to be slow growing NSCLC ,s/p XRT (07-2020) Hematology: ---Edema, R>L U/S (-) for DVT 01/2017  --B DVT and PE 04-2020 in the context of an acute admission to the hospital --DVT, left posterior tibial vein,  unprovoked, 10-2022, Rx Eliquis   PLAN: Follow-up, she seems stable HTN: BP is very good, continue with amlodipine, HCTZ, KCl.  Check BMP and CBC Hypothyroidism: Synthroid, check TSH. Nosebleeds: Recommend Vaseline at the nostrils twice daily.  Call if worse Social: Still drives very seldom, short distance, going to church and grocery shopping, never at night.  Advised to reconsider driving RTC 4 months   16-10 Here for CPX -Td 2019 - pneumonia shot 02-2008, 2019; prevnar : 2015. - s/p shingrix , s/p RSV  -Flu shot today -Recommend COVID booster at her convenience -No further cancer screenings. - Has a healthcare power of attorney. Social: Still living independently, typically family members stay with her at night or live with her temporarily.  Still drives short distances. Labs:  BMP TSH Has a healthcare POA. DVT 10/2022, DVT-PE 2021:  saw hematology, they felt that staying on age-adjusted Eliquis would be appropriate for now.  No change. HTN: BP is very good, continue amlodipine, HCTZ, potassium.  Check a BMP. Hypothyroidism: Check TSH. Osteoporosis: Had a number of complicated medical issues, consequently last Prolia was 10/2020.  She is really not interested on shots but would agreeable to oral medicines.  She is able to swallow without difficulties. Plan: Boniva 150 mg  tablet monthly.  Precautions discussed again. RTC 4 months.

## 2023-10-04 NOTE — Patient Instructions (Signed)
      GO TO THE LAB : Get the blood work     Next visit with me in 4 months    Please schedule it at the front desk

## 2023-10-05 ENCOUNTER — Encounter: Payer: Self-pay | Admitting: Internal Medicine

## 2023-10-05 NOTE — Assessment & Plan Note (Signed)
 Follow-up, she seems stable HTN: BP is very good, continue with amlodipine, HCTZ, KCl.  Check BMP and CBC Hypothyroidism: Synthroid, check TSH. Nosebleeds: Recommend Vaseline at the nostrils twice daily.  Call if worse Social: Still drives very seldom, short distance, going to church and grocery shopping, never at night.  Advised to consider stop driving RTC 4 months

## 2023-10-14 ENCOUNTER — Encounter: Payer: Self-pay | Admitting: Podiatry

## 2023-10-14 ENCOUNTER — Ambulatory Visit: Payer: Medicare HMO | Admitting: Podiatry

## 2023-10-14 DIAGNOSIS — M79676 Pain in unspecified toe(s): Secondary | ICD-10-CM

## 2023-10-14 DIAGNOSIS — I8290 Acute embolism and thrombosis of unspecified vein: Secondary | ICD-10-CM

## 2023-10-14 DIAGNOSIS — B351 Tinea unguium: Secondary | ICD-10-CM

## 2023-10-14 DIAGNOSIS — L84 Corns and callosities: Secondary | ICD-10-CM | POA: Diagnosis not present

## 2023-10-14 NOTE — Progress Notes (Signed)
 This patient returns to the office for evaluation and treatment of long thick painful nails .  This patient is unable to trim her own nails since the patient cannot reach the feet.  Patient says the nails are painful walking and wearing her shoes.      She  returns for preventive foot care services.   General Appearance  Alert, conversant and in no acute stress.  Vascular  Dorsalis pedis and posterior tibial  pulses are weakly  palpable  bilaterally.  Capillary return is within normal limits  bilaterally. Temperature is within normal limits  bilaterally.  Neurologic  Senn-Weinstein monofilament wire test within normal limits  bilaterally. Muscle power within normal limits bilaterally.  Nails Thick disfigured discolored nails with subungual debris  from hallux to fifth toes bilaterally. No evidence of bacterial infection or drainage bilaterally.  Orthopedic  No limitations of motion  feet .  No crepitus or effusions noted.  No bony pathology or digital deformities noted. HAV  B/L  Skin  normotropic skin  noted bilaterally.  No signs of infections or ulcers noted.   Pre-ulcerous callus sub 5th  B/L.   Onychomycosis  Pain in toes right foot  Pain in toes left foot    Debridement  of nails  1-5  B/L with a nail nipper.  Nails were then filed using a dremel tool with no incidents.   RTC 10  weeks   Helane Gunther DPM

## 2023-10-17 ENCOUNTER — Other Ambulatory Visit: Payer: Self-pay

## 2023-10-17 MED ORDER — POTASSIUM CHLORIDE CRYS ER 10 MEQ PO TBCR: 10.0000 meq | EXTENDED_RELEASE_TABLET | Freq: Every day | ORAL | 1 refills | Status: DC

## 2023-10-26 ENCOUNTER — Other Ambulatory Visit: Payer: Self-pay | Admitting: *Deleted

## 2023-10-26 MED ORDER — IBANDRONATE SODIUM 150 MG PO TABS: 150.0000 mg | ORAL_TABLET | ORAL | 1 refills | Status: DC

## 2023-11-18 DIAGNOSIS — D3131 Benign neoplasm of right choroid: Secondary | ICD-10-CM | POA: Diagnosis not present

## 2023-11-18 DIAGNOSIS — H2513 Age-related nuclear cataract, bilateral: Secondary | ICD-10-CM | POA: Diagnosis not present

## 2023-11-18 DIAGNOSIS — H353132 Nonexudative age-related macular degeneration, bilateral, intermediate dry stage: Secondary | ICD-10-CM | POA: Diagnosis not present

## 2023-11-18 DIAGNOSIS — D3132 Benign neoplasm of left choroid: Secondary | ICD-10-CM | POA: Diagnosis not present

## 2023-12-05 ENCOUNTER — Telehealth: Payer: Self-pay

## 2023-12-05 MED ORDER — CYCLOBENZAPRINE HCL 5 MG PO TABS: 5.0000 mg | ORAL_TABLET | Freq: Two times a day (BID) | ORAL | 0 refills | Status: DC | PRN

## 2023-12-05 NOTE — Telephone Encounter (Signed)
 Spoke w/ Pt-she declines fall or injury, states she has been doing some spring cleaning. Rx sent. Informed to watch for drowsiness and to come to office if not improving. Pt verbalized understanding.

## 2023-12-05 NOTE — Telephone Encounter (Signed)
 Copied from CRM [id number]. Topic: Clinical - Medical Advice >> Dec 05, 2023  9:01 AM Ariel Medina wrote: Reason for CRM: Muscle Spasms in her lower back , would like Dr. Neomi Banks to send in some muscle relaxers, patient stated she would like a callback if it can be sent   [facility] [address] [place], [place] - [address] [place] [address] Phone: [phone] Fax: [phone]

## 2023-12-05 NOTE — Telephone Encounter (Signed)
 Advise patient: If severe symptoms, recent fall or injury: Needs to be seen. Otherwise ok to send a prescription for Flexeril  5 mg twice daily as needed #20 no refills. Watch for drowsiness. Come back in the next few days if not improving

## 2023-12-22 ENCOUNTER — Other Ambulatory Visit: Payer: Self-pay | Admitting: Internal Medicine

## 2023-12-22 DIAGNOSIS — E039 Hypothyroidism, unspecified: Secondary | ICD-10-CM

## 2023-12-26 ENCOUNTER — Ambulatory Visit: Admitting: Podiatry

## 2023-12-26 ENCOUNTER — Encounter: Payer: Self-pay | Admitting: Podiatry

## 2023-12-26 DIAGNOSIS — B351 Tinea unguium: Secondary | ICD-10-CM | POA: Diagnosis not present

## 2023-12-26 DIAGNOSIS — M79676 Pain in unspecified toe(s): Secondary | ICD-10-CM

## 2023-12-26 DIAGNOSIS — Q828 Other specified congenital malformations of skin: Secondary | ICD-10-CM

## 2023-12-26 NOTE — Progress Notes (Signed)
 This patient returns to the office for evaluation and treatment of long thick painful nails .  This patient is unable to trim her own nails since the patient cannot reach the feet.  Patient says the nails are painful walking and wearing her shoes.      She  returns for preventive foot care services.   General Appearance  Alert, conversant and in no acute stress.  Vascular  Dorsalis pedis and posterior tibial  pulses are weakly  palpable  bilaterally.  Capillary return is within normal limits  bilaterally. Temperature is within normal limits  bilaterally.  Neurologic  Senn-Weinstein monofilament wire test within normal limits  bilaterally. Muscle power within normal limits bilaterally.  Nails Thick disfigured discolored nails with subungual debris  from hallux to fifth toes bilaterally. No evidence of bacterial infection or drainage bilaterally.  Orthopedic  No limitations of motion  feet .  No crepitus or effusions noted.  No bony pathology or digital deformities noted. HAV  B/L  Skin  normotropic skin  noted bilaterally.  No signs of infections or ulcers noted.   Pre-ulcerous callus sub 5th  B/L.  Callus sub 1 right foot secondary to HAV.  Onychomycosis  Pain in toes right foot  Pain in toes left foot    Debridement  of nails  1-5  B/L with a nail nipper.  Nails were then filed using a dremel tool with no incidents.  Debride callus  B/L with dremel tool. RTC 10  weeks   Ruffin Cotton DPM

## 2024-01-25 ENCOUNTER — Other Ambulatory Visit: Payer: Self-pay | Admitting: Internal Medicine

## 2024-02-01 ENCOUNTER — Ambulatory Visit: Payer: Medicare HMO | Admitting: Internal Medicine

## 2024-02-08 ENCOUNTER — Encounter: Payer: Self-pay | Admitting: Internal Medicine

## 2024-02-08 ENCOUNTER — Ambulatory Visit (INDEPENDENT_AMBULATORY_CARE_PROVIDER_SITE_OTHER): Admitting: Internal Medicine

## 2024-02-08 VITALS — BP 126/70 | HR 76 | Temp 98.0°F | Resp 16 | Ht 62.0 in | Wt 129.2 lb

## 2024-02-08 DIAGNOSIS — E785 Hyperlipidemia, unspecified: Secondary | ICD-10-CM

## 2024-02-08 DIAGNOSIS — E039 Hypothyroidism, unspecified: Secondary | ICD-10-CM | POA: Diagnosis not present

## 2024-02-08 DIAGNOSIS — M8000XS Age-related osteoporosis with current pathological fracture, unspecified site, sequela: Secondary | ICD-10-CM

## 2024-02-08 DIAGNOSIS — I10 Essential (primary) hypertension: Secondary | ICD-10-CM

## 2024-02-08 LAB — CBC WITH DIFFERENTIAL/PLATELET
Basophils Absolute: 0 10*3/uL (ref 0.0–0.1)
Basophils Relative: 0.6 % (ref 0.0–3.0)
Eosinophils Absolute: 0.1 10*3/uL (ref 0.0–0.7)
Eosinophils Relative: 0.9 % (ref 0.0–5.0)
HCT: 43.3 % (ref 36.0–46.0)
Hemoglobin: 14.4 g/dL (ref 12.0–15.0)
Lymphocytes Relative: 18 % (ref 12.0–46.0)
Lymphs Abs: 1.1 10*3/uL (ref 0.7–4.0)
MCHC: 33.2 g/dL (ref 30.0–36.0)
MCV: 94.6 fl (ref 78.0–100.0)
Monocytes Absolute: 0.6 10*3/uL (ref 0.1–1.0)
Monocytes Relative: 10.1 % (ref 3.0–12.0)
Neutro Abs: 4.2 10*3/uL (ref 1.4–7.7)
Neutrophils Relative %: 70.4 % (ref 43.0–77.0)
Platelets: 243 10*3/uL (ref 150.0–400.0)
RBC: 4.58 Mil/uL (ref 3.87–5.11)
RDW: 14.8 % (ref 11.5–15.5)
WBC: 5.9 10*3/uL (ref 4.0–10.5)

## 2024-02-08 LAB — BASIC METABOLIC PANEL WITH GFR
BUN: 12 mg/dL (ref 6–23)
CO2: 34 meq/L — ABNORMAL HIGH (ref 19–32)
Calcium: 9.7 mg/dL (ref 8.4–10.5)
Chloride: 99 meq/L (ref 96–112)
Creatinine, Ser: 0.68 mg/dL (ref 0.40–1.20)
GFR: 74.86 mL/min (ref >60.00)
Glucose, Bld: 85 mg/dL (ref 70–99)
Potassium: 3.8 meq/L (ref 3.5–5.1)
Sodium: 139 meq/L (ref 135–145)

## 2024-02-08 LAB — TSH: TSH: 2.55 u[IU]/mL (ref 0.35–5.50)

## 2024-02-08 NOTE — Patient Instructions (Signed)
 I am glad you are doing well !      GO TO THE LAB :  Get the blood work   Your results will be posted on MyChart with my comments  Next office visit for a physical exam by October 2025 Please make an appointment before you leave today

## 2024-02-08 NOTE — Progress Notes (Signed)
 Subjective:    Patient ID: Ariel Medina, female    DOB: 11/18/1927, 89 y.o.   MRN: [id number]  DOS:  02/08/2024 Type of visit - description: Follow-up  Feeling well, has no concerns.  Denies any nausea or vomiting.  No blood in the stools. No blood in the urine. No recent falls.  Review of Systems See above   Past Medical History:  Diagnosis Date   Anemia    Breast CA (HCC)    left breast-invasive ductal ca stage 1-stopped arimidex 2008   COPD (chronic obstructive pulmonary disease) (HCC) 02/26/2022   Diverticulitis of colon    GI bleed 04/2020   Hiatal hernia    History of radiation therapy 08/05/2020   54 Gray in 3 fractions to the left upper lung nodule 07/29/2020-08/05/2020.   Dr Lynwood Nasuti   Hyperlipidemia    Hypertension    Hypothyroidism    Intussusception of intestine (HCC) 2021   Lung mass    Osteoarthritis    Osteopenia    DEXA 7/02   Pulmonary embolism (HCC)/DVT    Syncope 2005   CT head (-), ECHO essent. neg, stress test (-), carotid u/s (-)    Past Surgical History:  Procedure Laterality Date   BREAST BIOPSY Bilateral    BREAST LUMPECTOMY  04/2002   left   PARTIAL COLECTOMY N/A 05/01/2020   Procedure: PARTIAL COLECTOMY;  Surgeon: Vernetta Berg, MD;  Location: [facility] OR;  Service: General;  Laterality: N/A;   TONSILLECTOMY AND ADENOIDECTOMY      Current Outpatient Medications  Medication Instructions   albuterol  (VENTOLIN  HFA) 108 (90 Base) MCG/ACT inhaler 2 puffs, Inhalation, Every 6 hours PRN   amLODipine  (NORVASC ) 5 mg, Oral, Daily   apixaban  (ELIQUIS ) 2.5 mg, Oral, 2 times daily   cyclobenzaprine  (FLEXERIL ) 5 mg, Oral, 2 times daily PRN   fluticasone  (FLONASE ) 50 MCG/ACT nasal spray 2 sprays, Each Nare, Daily PRN   hydrochlorothiazide  (HYDRODIURIL ) 12.5 mg, Oral, Daily   ibandronate  (BONIVA ) 150 mg, Oral, Every 30 days, Take in the morning with a full glass of water , on an empty stomach, and do not take anything else by mouth or lie down for  the next 30 min.   levothyroxine  (SYNTHROID ) 75 mcg, Oral, Daily before breakfast   loperamide  (IMODIUM ) 2 mg, Oral, Daily PRN   Multiple Vitamins-Minerals (PRESERVISION AREDS) TABS 1 tablet, 2 times daily   potassium chloride  (KLOR-CON  M) 10 MEQ tablet 10 mEq, Oral, Daily   SYMBICORT  160-4.5 MCG/ACT inhaler 2 puffs, Inhalation, 2 times daily       Objective:   Physical Exam BP 126/70   Pulse 76   Temp 98 F (36.7 C) (Oral)   Resp 16   Ht 5' 2 (1.575 m)   Wt 129 lb 4 oz (58.6 kg)   SpO2 95%   BMI 23.64 kg/m  General:   Well developed, NAD, BMI noted. HEENT:  Normocephalic . Face symmetric, atraumatic Lungs:  CTA B Normal respiratory effort, no intercostal retractions, no accessory muscle use. Heart: RRR,  no murmur.  Lower extremities: no pretibial edema bilaterally  Skin: Not pale. Not jaundice Neurologic:  alert & oriented X3.  Speech normal, gait assisted by a cane, small shuffling steps psych--  Cognition and judgment appear intact.  Cooperative with normal attention span and concentration.  Behavior appropriate. No anxious or depressed appearing.      Assessment    Assessment  HTN Hyperlipidemia.  Off statins per patient choice, likes to minimize medicines  Hypothyroidism Osteopenia:  --T score -2.1 (04-2012) DC Fosamax  12/2013 after 5 years --T score -1.5   (02-2015)  --T score -3.6 (april 2019) on Prolia , last dose 2022, restarted boniva  x 5 years DJD GERD  Tremors, likely essential H/o diverticulitis Breast cancer, L breast, release from oncology 2013 Hematology: ---Edema, R>L U/S (-) for DVT 01/2017  --B DVT and PE 04-2020 in the context of an acute admission to the hospital --DVT, left posterior tibial vein, unprovoked, 10-2022, Rx Eliquis  11-2016: Fall, FX L pubic ramus, admitted then d/c to a NH, back home 12-18-16 Lung mass:No biopsy, suspected to be slow growing NSCLC ,s/p XRT (07-2020)  PLAN: HTN: BP looks good, continue amlodipine , HCTZ,  potassium.  Check BMP Chronically anticoagulated: Without apparent problems, check a CBC Hypothyroidism: On Synthroid , check TSH Osteoporosis: Last DEXA 2019, for a number of reasons, Prolia  was stopped 2022 and  Boniva  started.  Plan is to continue  Boniva  until 2027.  No need to recheck a DEXA, that would not change our plans. Social: Still doing well, driving is very limited. RTC 05-2024 CPX

## 2024-02-08 NOTE — Assessment & Plan Note (Signed)
 HTN: BP looks good, continue amlodipine , HCTZ, potassium.  Check BMP Chronically anticoagulated: Without apparent problems, check a CBC Hypothyroidism: On Synthroid , check TSH Osteoporosis: Last DEXA 2019, for a number of reasons, Prolia  was stopped 2022 and  Boniva  started.  Plan is to continue  Boniva  until 2027.  No need to recheck a DEXA, that would not change our plans. Social: Still doing well, driving is very limited. RTC 05-2024 CPX

## 2024-02-09 ENCOUNTER — Ambulatory Visit: Payer: Self-pay | Admitting: Internal Medicine

## 2024-03-04 ENCOUNTER — Other Ambulatory Visit: Payer: Self-pay | Admitting: Internal Medicine

## 2024-03-04 ENCOUNTER — Other Ambulatory Visit: Payer: Self-pay | Admitting: Family

## 2024-03-07 ENCOUNTER — Ambulatory Visit: Admitting: Podiatry

## 2024-03-07 ENCOUNTER — Encounter: Payer: Self-pay | Admitting: Podiatry

## 2024-03-07 DIAGNOSIS — M79676 Pain in unspecified toe(s): Secondary | ICD-10-CM

## 2024-03-07 DIAGNOSIS — Q828 Other specified congenital malformations of skin: Secondary | ICD-10-CM | POA: Diagnosis not present

## 2024-03-07 DIAGNOSIS — B351 Tinea unguium: Secondary | ICD-10-CM | POA: Diagnosis not present

## 2024-03-07 NOTE — Progress Notes (Signed)
 This patient returns to the office for evaluation and treatment of long thick painful nails .  This patient is unable to trim her own nails since the patient cannot reach the feet.  Patient says the nails are painful walking and wearing her shoes.      She  returns for preventive foot care services.   General Appearance  Alert, conversant and in no acute stress.  Vascular  Dorsalis pedis and posterior tibial  pulses are weakly  palpable  bilaterally.  Capillary return is within normal limits  bilaterally. Temperature is within normal limits  bilaterally.  Neurologic  Senn-Weinstein monofilament wire test within normal limits  bilaterally. Muscle power within normal limits bilaterally.  Nails Thick disfigured discolored nails with subungual debris  from hallux to fifth toes bilaterally. No evidence of bacterial infection or drainage bilaterally.  Orthopedic  No limitations of motion  feet .  No crepitus or effusions noted.  No bony pathology or digital deformities noted. HAV  B/L  Skin  normotropic skin  noted bilaterally.  No signs of infections or ulcers noted.   Pre-ulcerous callus sub 5th  B/L.  Callus sub 1 right foot secondary to HAV.  Onychomycosis  Pain in toes right foot  Pain in toes left foot    Debridement  of nails  1-5  B/L with a nail nipper.  Nails were then filed using a dremel tool with no incidents.  Debride callus  B/L with dremel tool. RTC 10  weeks   Ruffin Cotton DPM

## 2024-03-22 ENCOUNTER — Other Ambulatory Visit: Payer: Self-pay | Admitting: Internal Medicine

## 2024-05-08 ENCOUNTER — Ambulatory Visit (INDEPENDENT_AMBULATORY_CARE_PROVIDER_SITE_OTHER)

## 2024-05-08 VITALS — Ht 62.0 in | Wt 129.0 lb

## 2024-05-08 DIAGNOSIS — Z0000 Encounter for general adult medical examination without abnormal findings: Secondary | ICD-10-CM | POA: Diagnosis not present

## 2024-05-08 NOTE — Progress Notes (Signed)
 Subjective:   Ariel Medina is a 89 y.o. who presents for a Medicare Wellness preventive visit.  As a reminder, Annual Wellness Visits don't include a physical exam, and some assessments may be limited, especially if this visit is performed virtually. We may recommend an in-person follow-up visit with your provider if needed.  Visit Complete: Virtual I connected with  Ariel Medina on 05/08/24 by a audio enabled telemedicine application and verified that I am speaking with the correct person using two identifiers.  Patient Location: Home  Provider Location: Home Office  I discussed the limitations of evaluation and management by telemedicine. The patient expressed understanding and agreed to proceed.  Vital Signs: Because this visit was a virtual/telehealth visit, some criteria may be missing or patient reported. Any vitals not documented were not able to be obtained and vitals that have been documented are patient reported.    Persons Participating in Visit: Patient.  AWV Questionnaire: No: Patient Medicare AWV questionnaire was not completed prior to this visit.  Cardiac Risk Factors include: advanced age (>36men, >31 women);hypertension     Objective:    Today's Vitals   05/08/24 0932  Weight: 129 lb (58.5 kg)  Height: 5' 2 (1.575 m)   Body mass index is 23.59 kg/m.     05/08/2024    9:41 AM 02/14/2023    1:01 PM 08/04/2022    9:32 AM 11/08/2021    5:00 PM 11/07/2021    9:09 AM 06/10/2021   10:27 AM 04/20/2021   11:12 AM  Advanced Directives  Does Patient Have a Medical Advance Directive? Yes Yes Yes Yes Yes Yes Yes  Type of Estate agent of [place];Living will Healthcare Power of [place];Living will Healthcare Power of [place];Living will Living will Living will Living will Living will  Does patient want to make changes to medical advance directive? No - Patient declined  No - Patient declined No - Patient declined   No - Patient declined   Copy of Healthcare Power of Attorney in Chart? Yes - validated most recent copy scanned in chart (See row information) Yes - validated most recent copy scanned in chart (See row information) Yes - validated most recent copy scanned in chart (See row information)        Current Medications (verified) Outpatient Encounter Medications as of 05/08/2024  Medication Sig   albuterol  (VENTOLIN  HFA) 108 (90 Base) MCG/ACT inhaler Inhale 2 puffs into the lungs every 6 (six) hours as needed for wheezing or shortness of breath.   amLODipine  (NORVASC ) 5 MG tablet TAKE 1 TABLET EVERY DAY   apixaban  (ELIQUIS ) 2.5 MG TABS tablet Take 1 tablet (2.5 mg total) by mouth 2 (two) times daily.   cyclobenzaprine  (FLEXERIL ) 5 MG tablet Take 1 tablet (5 mg total) by mouth 2 (two) times daily as needed for muscle spasms.   fluticasone  (FLONASE ) 50 MCG/ACT nasal spray Place 2 sprays into both nostrils daily as needed for allergies or rhinitis.   hydrochlorothiazide  (HYDRODIURIL ) 12.5 MG tablet TAKE 1 TABLET EVERY DAY   ibandronate  (BONIVA ) 150 MG tablet Take 1 tablet (150 mg total) by mouth every 30 (thirty) days. Take with full glass of water  on an empty stomach   levothyroxine  (SYNTHROID ) 75 MCG tablet Take 1 tablet (75 mcg total) by mouth daily before breakfast.   loperamide  (IMODIUM ) 2 MG capsule Take 1 capsule (2 mg total) by mouth daily as needed for diarrhea or loose stools.   Multiple Vitamins-Minerals (PRESERVISION AREDS) TABS Take  1 tablet by mouth in the morning and at bedtime.   potassium chloride  (KLOR-CON  M) 10 MEQ tablet TAKE 1 TABLET EVERY DAY   SYMBICORT  160-4.5 MCG/ACT inhaler INHALE 2 PUFFS INTO THE LUNGS TWICE DAILY   No facility-administered encounter medications on file as of 05/08/2024.    Allergies (verified) Ibuprofen   History: Past Medical History:  Diagnosis Date   Anemia    Breast CA (HCC)    left breast-invasive ductal ca stage 1-stopped arimidex 2008   COPD (chronic obstructive  pulmonary disease) (HCC) 02/26/2022   Diverticulitis of colon    GI bleed 04/2020   Hiatal hernia    History of radiation therapy 08/05/2020   56 Gray in 3 fractions to the left upper lung nodule 07/29/2020-08/05/2020.   Dr Lynwood Nasuti   Hyperlipidemia    Hypertension    Hypothyroidism    Intussusception of intestine (HCC) 2021   Lung mass    Osteoarthritis    Osteopenia    DEXA 7/02   Pulmonary embolism (HCC)/DVT    Syncope 2005   CT head (-), ECHO essent. neg, stress test (-), carotid u/s (-)   Past Surgical History:  Procedure Laterality Date   BREAST BIOPSY Bilateral    BREAST LUMPECTOMY  04/2002   left   PARTIAL COLECTOMY N/A 05/01/2020   Procedure: PARTIAL COLECTOMY;  Surgeon: Vernetta Berg, MD;  Location: [facility] OR;  Service: General;  Laterality: N/A;   TONSILLECTOMY AND ADENOIDECTOMY     Family History  Problem Relation Age of Onset   Heart attack Brother 44   Diabetes Mother    Stroke Father 6   Stroke Sister 46   Colon cancer Neg Hx    Breast cancer Neg Hx    Social History   Socioeconomic History   Marital status: Widowed    Spouse name: Not on file   Number of children: 2   Years of education: Not on file   Highest education level: Not on file  Occupational History   Occupation: n/a    Employer: RETIRED  Tobacco Use   Smoking status: Never   Smokeless tobacco: Never  Vaping Use   Vaping status: Never Used  Substance and Sexual Activity   Alcohol use: No   Drug use: No   Sexual activity: Not Currently  Other Topics Concern   Not on file  Social History Narrative     Lost a son 66   Widow since 44   G-son  live w/ her from time to time, not permanently   G-daughter Aleck lives 15 min away    Sister live next door , does help the pt    G-son @ [employer] , G-daughter @ [employer]    still drives, short distances, typically not at night.   Social Drivers of Corporate investment banker Strain: Low Risk  (05/08/2024)   Overall Financial  Resource Strain (CARDIA)    Difficulty of Paying Living Expenses: Not hard at all  Food Insecurity: No Food Insecurity (05/08/2024)   Hunger Vital Sign    Worried About Running Out of Food in the Last Year: Never true    Ran Out of Food in the Last Year: Never true  Transportation Needs: No Transportation Needs (05/08/2024)   PRAPARE - Administrator, Civil Service (Medical): No    Lack of Transportation (Non-Medical): No  Physical Activity: Inactive (05/08/2024)   Exercise Vital Sign    Days of Exercise per Week: 0 days  Minutes of Exercise per Session: 0 min  Stress: No Stress Concern Present (05/08/2024)   [employer] of Occupational Health - Occupational Stress Questionnaire    Feeling of Stress: Not at all  Social Connections: Moderately Integrated (05/08/2024)   Social Connection and Isolation Panel    Frequency of Communication with Friends and Family: More than three times a week    Frequency of Social Gatherings with Friends and Family: More than three times a week    Attends Religious Services: More than 4 times per year    Active Member of [employer] or Organizations: Yes    Attends Banker Meetings: More than 4 times per year    Marital Status: Widowed    Tobacco Counseling Counseling given: Not Answered    Clinical Intake:  Pre-visit preparation completed: Yes  Pain : No/denies pain     BMI - recorded: 23.59 Nutritional Status: BMI of 19-24  Normal Nutritional Risks: None Diabetes: No  No results found for: HGBA1C   How often do you need to have someone help you when you read instructions, pamphlets, or other written materials from your doctor or pharmacy?: 1 - Never  Interpreter Needed?: No  Information entered by :: Rojelio Blush LPN   Activities of Daily Living     05/08/2024    9:40 AM 02/08/2024    9:59 AM  In your present state of health, do you have any difficulty performing the following activities:  Hearing? 0 0   Vision? 0 0  Difficulty concentrating or making decisions? 0 0  Walking or climbing stairs? 1 1  Comment Uses a Restaurant manager, fast food   Dressing or bathing? 0 0  Doing errands, shopping? 0 1  Preparing Food and eating ? N   Using the Toilet? N   In the past six months, have you accidently leaked urine? N   Do you have problems with loss of bowel control? N   Managing your Medications? N   Managing your Finances? N   Housekeeping or managing your Housekeeping? N     Patient Care Team: Amon Aloysius BRAVO, MD as PCP - General Lavona Agent, MD as PCP - Cardiology (Cardiology) Floy Agent PARAS, MD (Inactive) as Consulting Physician (Otolaryngology) Loreda Hacker, DPM as Consulting Physician (Podiatry) Carla Milling, RPH-CPP (Pharmacist)  I have updated your Care Teams any recent Medical Services you may have received from other providers in the past year.     Assessment:   This is a routine wellness examination for [place].  Hearing/Vision screen Hearing Screening - Comments:: Denies hearing difficulties   Vision Screening - Comments:: Wears rx glasses - up to date with routine eye exams with  Deferred   Goals Addressed               This Visit's Progress     Stay active (pt-stated)         Depression Screen     05/08/2024    9:38 AM 02/08/2024    9:59 AM 06/03/2023    2:02 PM 01/31/2023   10:39 AM 09/30/2022   10:26 AM 08/04/2022    9:28 AM 02/26/2022   10:42 AM  PHQ 2/9 Scores  PHQ - 2 Score 0 0 0 0 0 0 0    Fall Risk     05/08/2024    9:41 AM 02/08/2024    9:59 AM 06/03/2023    2:02 PM 01/31/2023   10:39 AM 09/30/2022   10:26 AM  Fall Risk  Falls in the past year? 0 0 0 0 0  Number falls in past yr: 0 0 0 0 0  Injury with Fall? 0 0 0 0 0  Risk for fall due to : No Fall Risks      Follow up Falls evaluation completed Falls evaluation completed;Education provided Falls evaluation completed Falls evaluation completed Falls evaluation completed;Education provided     MEDICARE RISK AT HOME:  Medicare Risk at Home Any stairs in or around the home?: No If so, are there any without handrails?: No Home free of loose throw rugs in walkways, pet beds, electrical cords, etc?: Yes Adequate lighting in your home to reduce risk of falls?: Yes Life alert?: No Use of a cane, walker or w/c?: Yes Grab bars in the bathroom?: Yes Shower chair or bench in shower?: Yes Elevated toilet seat or a handicapped toilet?: Yes  TIMED UP AND GO:  Was the test performed?  No  Cognitive Function: 6CIT completed        05/08/2024    9:41 AM 08/04/2022    9:36 AM 03/03/2020    8:51 AM  6CIT Screen  What Year? 0 points 0 points 0 points  What month? 0 points 0 points 0 points  What time? 0 points 0 points 0 points  Count back from 20 0 points 0 points 0 points  Months in reverse 0 points 0 points 0 points  Repeat phrase 0 points 0 points 0 points  Total Score 0 points 0 points 0 points    Immunizations Immunization History  Administered Date(s) Administered   Fluad Quad(high Dose 65+) 06/02/2020, 05/19/2022   Fluad Trivalent(High Dose 65+) 06/03/2023   H1N1 08/29/2008   INFLUENZA, HIGH DOSE SEASONAL PF 06/07/2013, 05/13/2015, 07/20/2016, 04/20/2017, 05/25/2018, 05/28/2019   Influenza Whole 05/12/2010   Influenza,inj,Quad PF,6+ Mos 06/10/2014   Influenza-Unspecified 06/01/2021   PFIZER(Purple Top)SARS-COV-2 Vaccination 09/30/2019, 10/24/2019, 07/31/2020   PPD Test 12/14/2016   Pfizer(Comirnaty)Fall Seasonal Vaccine 12 years and older 02/14/2023   Pneumococcal Conjugate-13 06/10/2014   Pneumococcal Polysaccharide-23 07/10/2003, 02/21/2008, 11/23/2017   RSV,unspecified 02/14/2023   Td 05/16/2007, 05/25/2018   Zoster Recombinant(Shingrix) 10/12/2018, 06/28/2019   Zoster, Live 12/13/2012    Screening Tests Health Maintenance  Topic Date Due   Influenza Vaccine  03/09/2024   COVID-19 Vaccine (5 - 2025-26 season) 04/09/2024   DTaP/Tdap/Td (3 - Tdap)  05/25/2028   Pneumococcal Vaccine: 50+ Years  Completed   DEXA SCAN  Completed   Zoster Vaccines- Shingrix  Completed   HPV VACCINES  Aged Out   Meningococcal B Vaccine  Aged Out   Mammogram  Discontinued    Health Maintenance Items Addressed:   Additional Screening:  Vision Screening: Recommended annual ophthalmology exams for early detection of glaucoma and other disorders of the eye. Is the patient up to date with their annual eye exam?  Yes  Who is the provider or what is the name of the office in which the patient attends annual eye exams? Deferred  Dental Screening: Recommended annual dental exams for proper oral hygiene  Community Resource Referral / Chronic Care Management: CRR required this visit?  No   CCM required this visit?  No   Plan:    I have personally reviewed and noted the following in the patient's chart:   Medical and social history Use of alcohol, tobacco or illicit drugs  Current medications and supplements including opioid prescriptions. Patient is not currently taking opioid prescriptions. Functional ability and status Nutritional status Physical activity Advanced  directives List of other physicians Hospitalizations, surgeries, and ER visits in previous 12 months Vitals Screenings to include cognitive, depression, and falls Referrals and appointments  In addition, I have reviewed and discussed with patient certain preventive protocols, quality metrics, and best practice recommendations. A written personalized care plan for preventive services as well as general preventive health recommendations were provided to patient.   Rojelio LELON Blush, LPN   [id number]   After Visit Summary: (MyChart) Due to this being a telephonic visit, the after visit summary with patients personalized plan was offered to patient via MyChart   Notes: Nothing significant to report at this time.

## 2024-05-08 NOTE — Patient Instructions (Addendum)
 Ariel Medina,  Thank you for taking the time for your Medicare Wellness Visit. I appreciate your continued commitment to your health goals. Please review the care plan we discussed, and feel free to reach out if I can assist you further.  Medicare recommends these wellness visits once per year to help you and your care team stay ahead of potential health issues. These visits are designed to focus on prevention, allowing your provider to concentrate on managing your acute and chronic conditions during your regular appointments.  Please note that Annual Wellness Visits do not include a physical exam. Some assessments may be limited, especially if the visit was conducted virtually. If needed, we may recommend a separate in-person follow-up with your provider.  Ongoing Care Seeing your primary care provider every 3 to 6 months helps us  monitor your health and provide consistent, personalized care.   Referrals If a referral was made during today's visit and you haven't received any updates within two weeks, please contact the referred provider directly to check on the status.  Recommended Screenings:  Health Maintenance  Topic Date Due   Flu Shot  03/09/2024   COVID-19 Vaccine (5 - 2025-26 season) 04/09/2024   DTaP/Tdap/Td vaccine (3 - Tdap) 05/25/2028   Pneumococcal Vaccine for age over 42  Completed   DEXA scan (bone density measurement)  Completed   Zoster (Shingles) Vaccine  Completed   HPV Vaccine  Aged Out   Meningitis B Vaccine  Aged Out   Breast Cancer Screening  Discontinued       05/08/2024    9:41 AM  Advanced Directives  Does Patient Have a Medical Advance Directive? Yes  Type of Estate agent of [place];Living will  Does patient want to make changes to medical advance directive? No - Patient declined  Copy of Healthcare Power of Attorney in Chart? Yes - validated most recent copy scanned in chart (See row information)   Advance Care Planning is  important because it: Ensures you receive medical care that aligns with your values, goals, and preferences. Provides guidance to your family and loved ones, reducing the emotional burden of decision-making during critical moments.  Vision: Annual vision screenings are recommended for early detection of glaucoma, cataracts, and diabetic retinopathy. These exams can also reveal signs of chronic conditions such as diabetes and high blood pressure.  Dental: Annual dental screenings help detect early signs of oral cancer, gum disease, and other conditions linked to overall health, including heart disease and diabetes.  Please see the attached documents for additional preventive care recommendations.

## 2024-05-14 ENCOUNTER — Encounter: Admitting: Internal Medicine

## 2024-05-16 ENCOUNTER — Encounter: Payer: Self-pay | Admitting: Podiatry

## 2024-05-16 ENCOUNTER — Ambulatory Visit: Admitting: Podiatry

## 2024-05-16 DIAGNOSIS — L84 Corns and callosities: Secondary | ICD-10-CM | POA: Diagnosis not present

## 2024-05-16 DIAGNOSIS — M79676 Pain in unspecified toe(s): Secondary | ICD-10-CM

## 2024-05-16 DIAGNOSIS — B351 Tinea unguium: Secondary | ICD-10-CM | POA: Diagnosis not present

## 2024-05-16 DIAGNOSIS — Q828 Other specified congenital malformations of skin: Secondary | ICD-10-CM | POA: Diagnosis not present

## 2024-05-16 NOTE — Progress Notes (Signed)
 This patient returns to the office for evaluation and treatment of long thick painful nails .  This patient is unable to trim her own nails since the patient cannot reach the feet.  Patient says the nails are painful walking and wearing her shoes.      She  returns for preventive foot care services.   General Appearance  Alert, conversant and in no acute stress.  Vascular  Dorsalis pedis and posterior tibial  pulses are weakly  palpable  bilaterally.  Capillary return is within normal limits  bilaterally. Temperature is within normal limits  bilaterally.  Neurologic  Senn-Weinstein monofilament wire test within normal limits  bilaterally. Muscle power within normal limits bilaterally.  Nails Thick disfigured discolored nails with subungual debris  from hallux to fifth toes bilaterally. No evidence of bacterial infection or drainage bilaterally.  Orthopedic  No limitations of motion  feet .  No crepitus or effusions noted.  No bony pathology or digital deformities noted. HAV  B/L  Skin  normotropic skin  noted bilaterally.  No signs of infections or ulcers noted.   Pre-ulcerous callus sub 5th  B/L.  Callus sub 1 right foot secondary to HAV.  Onychomycosis  Pain in toes right foot  Pain in toes left foot    Debridement  of nails  1-5  B/L with a nail nipper.  Nails were then filed using a dremel tool with no incidents.  Debride callus  B/L with dremel tool. RTC 10  weeks   Ruffin Cotton DPM

## 2024-05-25 ENCOUNTER — Other Ambulatory Visit: Payer: Self-pay | Admitting: Internal Medicine

## 2024-05-25 DIAGNOSIS — E039 Hypothyroidism, unspecified: Secondary | ICD-10-CM

## 2024-06-02 DIAGNOSIS — Z7901 Long term (current) use of anticoagulants: Secondary | ICD-10-CM | POA: Diagnosis not present

## 2024-06-02 DIAGNOSIS — M1990 Unspecified osteoarthritis, unspecified site: Secondary | ICD-10-CM | POA: Diagnosis not present

## 2024-06-02 DIAGNOSIS — E785 Hyperlipidemia, unspecified: Secondary | ICD-10-CM | POA: Diagnosis not present

## 2024-06-02 DIAGNOSIS — Z86711 Personal history of pulmonary embolism: Secondary | ICD-10-CM | POA: Diagnosis not present

## 2024-06-02 DIAGNOSIS — L309 Dermatitis, unspecified: Secondary | ICD-10-CM | POA: Diagnosis not present

## 2024-06-02 DIAGNOSIS — F325 Major depressive disorder, single episode, in full remission: Secondary | ICD-10-CM | POA: Diagnosis not present

## 2024-06-02 DIAGNOSIS — H9193 Unspecified hearing loss, bilateral: Secondary | ICD-10-CM | POA: Diagnosis not present

## 2024-06-02 DIAGNOSIS — D6869 Other thrombophilia: Secondary | ICD-10-CM | POA: Diagnosis not present

## 2024-06-02 DIAGNOSIS — Z9989 Dependence on other enabling machines and devices: Secondary | ICD-10-CM | POA: Diagnosis not present

## 2024-06-02 DIAGNOSIS — H3530 Unspecified macular degeneration: Secondary | ICD-10-CM | POA: Diagnosis not present

## 2024-06-02 DIAGNOSIS — M810 Age-related osteoporosis without current pathological fracture: Secondary | ICD-10-CM | POA: Diagnosis not present

## 2024-06-02 DIAGNOSIS — J449 Chronic obstructive pulmonary disease, unspecified: Secondary | ICD-10-CM | POA: Diagnosis not present

## 2024-06-02 DIAGNOSIS — R32 Unspecified urinary incontinence: Secondary | ICD-10-CM | POA: Diagnosis not present

## 2024-06-02 DIAGNOSIS — E039 Hypothyroidism, unspecified: Secondary | ICD-10-CM | POA: Diagnosis not present

## 2024-06-02 DIAGNOSIS — I4891 Unspecified atrial fibrillation: Secondary | ICD-10-CM | POA: Diagnosis not present

## 2024-06-02 DIAGNOSIS — K056 Periodontal disease, unspecified: Secondary | ICD-10-CM | POA: Diagnosis not present

## 2024-06-02 DIAGNOSIS — Z7951 Long term (current) use of inhaled steroids: Secondary | ICD-10-CM | POA: Diagnosis not present

## 2024-06-02 DIAGNOSIS — K08409 Partial loss of teeth, unspecified cause, unspecified class: Secondary | ICD-10-CM | POA: Diagnosis not present

## 2024-06-02 DIAGNOSIS — Z7983 Long term (current) use of bisphosphonates: Secondary | ICD-10-CM | POA: Diagnosis not present

## 2024-06-02 DIAGNOSIS — Z9181 History of falling: Secondary | ICD-10-CM | POA: Diagnosis not present

## 2024-06-05 ENCOUNTER — Ambulatory Visit: Admitting: Internal Medicine

## 2024-06-05 ENCOUNTER — Encounter: Payer: Self-pay | Admitting: Internal Medicine

## 2024-06-05 VITALS — BP 116/66 | HR 71 | Temp 97.9°F | Resp 16 | Ht 62.0 in | Wt 124.1 lb

## 2024-06-05 DIAGNOSIS — I10 Essential (primary) hypertension: Secondary | ICD-10-CM | POA: Diagnosis not present

## 2024-06-05 DIAGNOSIS — E559 Vitamin D deficiency, unspecified: Secondary | ICD-10-CM

## 2024-06-05 DIAGNOSIS — E039 Hypothyroidism, unspecified: Secondary | ICD-10-CM

## 2024-06-05 DIAGNOSIS — E785 Hyperlipidemia, unspecified: Secondary | ICD-10-CM

## 2024-06-05 DIAGNOSIS — Z23 Encounter for immunization: Secondary | ICD-10-CM | POA: Diagnosis not present

## 2024-06-05 DIAGNOSIS — Z0000 Encounter for general adult medical examination without abnormal findings: Secondary | ICD-10-CM

## 2024-06-05 NOTE — Patient Instructions (Signed)
 GO TO THE LAB :  Get the blood work    Then, go to the front desk for the checkout Please make an appointment for a checkup in 4 to 6 months    Check the  blood pressure regularly Blood pressure goal:  between 110/65 and  135/85. If it is consistently higher or lower, let me know   YOUR PLAN:   HYPERTENSION: Your blood pressure is well-controlled with your current medications. -Continue taking Amlodipine , Hydrochlorothiazide , and Potassium chloride  as prescribed. -We will order a Basic Metabolic Panel (BMP) to check your potassium levels.  HYPOTHYROIDISM: Your thyroid  condition is managed with Levothyroxine . -Continue taking Levothyroxine  as prescribed.  OSTEOPOROSIS: You are taking Ibandronate  and vitamin D  for your osteoporosis. -Continue taking Ibandronate  and vitamin D  as prescribed. -We will check your vitamin D  levels.  PULMONARY EMBOLISM AND DEEP VEIN THROMBOSIS: You are on Apixaban  for anticoagulation to prevent blood clots. -Continue taking Apixaban  as prescribed.  GENERAL HEALTH MAINTENANCE: We are keeping up with your vaccinations to maintain your overall health. -You received a pneumonia vaccine today. -Consider getting the COVID vaccine at a later date from the pharmacy.

## 2024-06-05 NOTE — Progress Notes (Unsigned)
 Subjective:    Patient ID: Ariel Medina, female    DOB: 05/02/1928, 89 y.o.   MRN: [id number]  DOS:  06/05/2024 CPX, here with her Sister Ariel Medina  Discussed the use of AI scribe software for clinical note transcription with the patient, who gave verbal consent to proceed.  History of Present Illness Ariel Medina is a 89 year old female who presents for an annual physical exam. She is accompanied by her sister, Ariel PARAS, who assists her frequently.  Lower extremity pain and mobility impairment - Experiences tenderness and pain in the legs - Uses a cane regularly for ambulation - A four-legged walker is available at home to prevent falls - No recent falls reported  Cardiopulmonary and gastrointestinal symptoms - No chest pain - No respiratory difficulties - No nausea or vomiting - No blood in stools  Genitourinary symptoms - No urinary problems  Medication tolerance - Takes amlodipine , hydrochlorothiazide , and potassium for hypertension - Takes Synthroid  for thyroid  management - Takes Roniva for osteoporosis - Takes vitamin D  and a multivitamin - Takes Eliquis  for anticoagulation without bleeding issues    Review of Systems See above   Past Medical History:  Diagnosis Date   Anemia    Breast CA (HCC)    left breast-invasive ductal ca stage 1-stopped arimidex 2008   COPD (chronic obstructive pulmonary disease) (HCC) 02/26/2022   Diverticulitis of colon    GI bleed 04/2020   Hiatal hernia    History of radiation therapy 08/05/2020   54 Gray in 3 fractions to the left upper lung nodule 07/29/2020-08/05/2020.   Dr Lynwood Nasuti   Hyperlipidemia    Hypertension    Hypothyroidism    Intussusception of intestine (HCC) 2021   Lung mass    Osteoarthritis    Osteopenia    DEXA 7/02   Pulmonary embolism (HCC)/DVT    Syncope 2005   CT head (-), ECHO essent. neg, stress test (-), carotid u/s (-)    Past Surgical History:  Procedure Laterality Date   BREAST  BIOPSY Bilateral    BREAST LUMPECTOMY  04/2002   left   PARTIAL COLECTOMY N/A 05/01/2020   Procedure: PARTIAL COLECTOMY;  Surgeon: Vernetta Berg, MD;  Location: [facility] OR;  Service: General;  Laterality: N/A;   TONSILLECTOMY AND ADENOIDECTOMY      Current Outpatient Medications  Medication Instructions   albuterol  (VENTOLIN  HFA) 108 (90 Base) MCG/ACT inhaler 2 puffs, Inhalation, Every 6 hours PRN   amLODipine  (NORVASC ) 5 mg, Oral, Daily   apixaban  (ELIQUIS ) 2.5 mg, Oral, 2 times daily   cyclobenzaprine  (FLEXERIL ) 5 mg, Oral, 2 times daily PRN   fluticasone  (FLONASE ) 50 MCG/ACT nasal spray 2 sprays, Each Nare, Daily PRN   hydrochlorothiazide  (HYDRODIURIL ) 12.5 mg, Oral, Daily   ibandronate  (BONIVA ) 150 mg, Oral, Every 30 days, Take with full glass of water  on an empty stomach   levothyroxine  (SYNTHROID ) 75 mcg, Oral, Daily before breakfast   loperamide  (IMODIUM ) 2 mg, Oral, Daily PRN   Multiple Vitamins-Minerals (PRESERVISION AREDS) TABS 1 tablet, 2 times daily   potassium chloride  (KLOR-CON  M) 10 MEQ tablet 10 mEq, Oral, Daily   SYMBICORT  160-4.5 MCG/ACT inhaler 2 puffs, Inhalation, 2 times daily       Objective:   Physical Exam BP 116/66   Pulse 71   Temp 97.9 F (36.6 C) (Oral)   Resp 16   Ht 5' 2 (1.575 m)   Wt 124 lb 2 oz (56.3 kg)   BMI 22.70  kg/m  General: Well developed, NAD, BMI noted HEENT:  Normocephalic . Face symmetric, atraumatic Lungs:  Decreased sounds but clear Normal respiratory effort, no intercostal retractions, no accessory muscle use. Heart: RRR,  no murmur.  Abdomen:  Not distended, soft, non-tender. No rebound or rigidity.     Skin: Exposed areas without rash. Not pale. Not jaundice Neurologic:  alert & oriented X3.  Speech normal, gait assisted by cane, shuffles but seems steady. Strength symmetric and appropriate for age.  Psych: Cognition and judgment appear intact.  Cooperative with normal attention span and concentration.  Behavior  appropriate. No anxious or depressed appearing.     Assessment   Assessment  HTN Hyperlipidemia.  Off statins per patient choice, likes to minimize medicines Hypothyroidism Osteopenia:  --T score -2.1 (04-2012) DC Fosamax  12/2013 after 5 years --T score -1.5   (02-2015)  --T score -3.6 (april 2019) on Prolia , last dose 2022, restarted boniva  x 5 years DJD GERD  Tremors, likely essential H/o diverticulitis Breast cancer, L breast, release from oncology 2013 Hematology: ---Edema, R>L U/S (-) for DVT 01/2017  --B DVT and PE 04-2020 in the context of an acute admission to the hospital --DVT, left posterior tibial vein, unprovoked, 10-2022, Rx Eliquis  11-2016: Fall, FX L pubic ramus, admitted then d/c to a NH, back home 12-18-16 Lung mass:No biopsy, suspected to be slow growing NSCLC ,s/p XRT (07-2020)   Here for CPX -Td 2019 - pneumonia shot 02-2008, 2019; prevnar : 2015.  PNM 20 today 06/05/2024 - s/p shingrix , s/p RSV  - Had a flu shot. - Recommend a COVID booster at the pharmacy-No further cancer screenings. - Has a healthcare power of attorney. -Labs: See orders - Continue using her cane, no recent falls   Assessment & Plan Hypertension Blood pressure well-controlled with current regimen. - Continue Amlodipine , Hydrochlorothiazide , and Potassium chloride . - Order BMP to check potassium levels.  Hypothyroidism Condition managed with Levothyroxine . - Continue Levothyroxine .  Osteoporosis On Ibandronate  and vitamin D  supplementation. - Continue Ibandronate  and a multivitamin.  Vitamin D  deficiency: Per chart review, on a multivitamin, check levels. Pulmonary Embolism and Deep Vein Thrombosis On Apixaban  for anticoagulation. - Continue Apixaban .  General Health Maintenance Flu shot received. Considering COVID vaccine. No recent pneumonia shot. - Administer pneumonia vaccine today. - Recommend COVID vaccine at a later date from the pharmacy. Social: Still living  independently, typically family members stay with her at night or live with her temporarily. Ariel Medina  (sister) helps her frequently.  Still drives short distances.   Follow-up Routine follow-up planned to monitor health status and medication efficacy. - Schedule follow-up appointment in 4 to 6 months.

## 2024-06-06 ENCOUNTER — Encounter: Payer: Self-pay | Admitting: Internal Medicine

## 2024-06-06 LAB — TSH: TSH: 4.53 u[IU]/mL (ref 0.35–5.50)

## 2024-06-06 LAB — BASIC METABOLIC PANEL WITH GFR
BUN: 12 mg/dL (ref 6–23)
CO2: 32 meq/L (ref 19–32)
Calcium: 9.7 mg/dL (ref 8.4–10.5)
Chloride: 99 meq/L (ref 96–112)
Creatinine, Ser: 0.75 mg/dL (ref 0.40–1.20)
GFR: 68.27 mL/min (ref >60.00)
Glucose, Bld: 87 mg/dL (ref 70–99)
Potassium: 3.9 meq/L (ref 3.5–5.1)
Sodium: 141 meq/L (ref 135–145)

## 2024-06-06 LAB — VITAMIN D 25 HYDROXY (VIT D DEFICIENCY, FRACTURES): VITD: 21.85 ng/mL — ABNORMAL LOW (ref 30.00–100.00)

## 2024-06-06 NOTE — Assessment & Plan Note (Signed)
 Here for CPX   Other issues addressed today HTN: Blood pressure well-controlled with  Amlodipine , Hydrochlorothiazide , and Potassium chloride .  Check a BMP Hypothyroidism: Condition managed with Levothyroxine .  Check a TSH Osteoporosis On Ibandronate  and vitamin D  supplementation.  No change Vitamin D  deficiency: Per chart review, on a multivitamin, check levels. H/o Pulmonary Embolism and Deep Vein Thrombosis On Apixaban  for anticoagulation.  Seems to tolerate well.   Social: Still living independently, has a lot of family support including   Ronal Bradley  (sister) who helps her frequently.  Still drives short distances. RTC 4 to 6 months

## 2024-06-06 NOTE — Assessment & Plan Note (Signed)
 Here for CPX -Td 2019 - pneumonia shot 02-2008, 2019; prevnar : 2015.  PNM 20 today 06/05/2024 - s/p shingrix , s/p RSV  - Had a flu shot. - Recommend a COVID booster at the pharmacy -No further cancer screenings. - Has a healthcare power of attorney. -Labs: See orders - Continue using her cane, no recent falls

## 2024-06-07 ENCOUNTER — Ambulatory Visit: Payer: Self-pay | Admitting: Internal Medicine

## 2024-06-07 MED ORDER — VITAMIN D (ERGOCALCIFEROL) 1.25 MG (50000 UNIT) PO CAPS: 50000.0000 [IU] | ORAL_CAPSULE | ORAL | 0 refills | Status: AC

## 2024-06-07 MED ORDER — VITAMIN D3 50 MCG (2000 UT) PO CAPS: 2000.0000 [IU] | ORAL_CAPSULE | Freq: Every day | ORAL | 3 refills | Status: AC

## 2024-07-27 ENCOUNTER — Ambulatory Visit: Admitting: Podiatry

## 2024-07-27 DIAGNOSIS — M79676 Pain in unspecified toe(s): Secondary | ICD-10-CM | POA: Diagnosis not present

## 2024-07-27 DIAGNOSIS — L84 Corns and callosities: Secondary | ICD-10-CM

## 2024-07-27 DIAGNOSIS — Q828 Other specified congenital malformations of skin: Secondary | ICD-10-CM | POA: Diagnosis not present

## 2024-07-27 DIAGNOSIS — B351 Tinea unguium: Secondary | ICD-10-CM

## 2024-07-27 NOTE — Progress Notes (Signed)
 This patient returns to the office for evaluation and treatment of long thick painful nails .  This patient is unable to trim her own nails since the patient cannot reach the feet.  Patient says the nails are painful walking and wearing her shoes.      She  returns for preventive foot care services.   General Appearance  Alert, conversant and in no acute stress.  Vascular  Dorsalis pedis and posterior tibial  pulses are weakly  palpable  bilaterally.  Capillary return is within normal limits  bilaterally. Temperature is within normal limits  bilaterally.  Neurologic  Senn-Weinstein monofilament wire test within normal limits  bilaterally. Muscle power within normal limits bilaterally.  Nails Thick disfigured discolored nails with subungual debris  from hallux to fifth toes bilaterally. No evidence of bacterial infection or drainage bilaterally.  Orthopedic  No limitations of motion  feet .  No crepitus or effusions noted.  No bony pathology or digital deformities noted. HAV  B/L  Skin  normotropic skin  noted bilaterally.  No signs of infections or ulcers noted.   Pre-ulcerous callus sub 5th  B/L.  Callus sub 1 right foot secondary to HAV.  Onychomycosis  Pain in toes right foot  Pain in toes left foot  Callus sub 5th  B/L  Debridement  of nails  1-5  B/L with a nail nipper.  Nails were then filed using a dremel tool with no incidents.  Debride callus  B/L with dremel tool. RTC 10  weeks   Cordella Bold DPM

## 2024-08-21 ENCOUNTER — Ambulatory Visit: Payer: Self-pay | Admitting: Internal Medicine

## 2024-08-21 MED ORDER — CYCLOBENZAPRINE HCL 5 MG PO TABS: 5.0000 mg | ORAL_TABLET | Freq: Two times a day (BID) | ORAL | 0 refills | Status: AC | PRN

## 2024-08-21 NOTE — Telephone Encounter (Signed)
 Advised patient: I sent Flexeril . Take it as needed along with Tylenol . Watch for drowsiness. Seek medical attention if: Fever or chills, severe pain, leg numbness.

## 2024-08-21 NOTE — Telephone Encounter (Signed)
 Please advise- Pt refusing to schedule appt, wanting refill on flexeril . Please advise?

## 2024-08-21 NOTE — Telephone Encounter (Signed)
Spoke w/ Pt- informed of PCP recommendations.  

## 2024-08-21 NOTE — Telephone Encounter (Signed)
 FYI Only or Action Required?: Action required by provider: medication refill request.Requests call back today  Patient was last seen in primary care on 06/05/2024 by Amon Aloysius BRAVO, MD.  Called Nurse Triage reporting Back Pain.  Symptoms began Chronic, flair today after housework.  Interventions attempted: Rest, hydration, or home remedies.  Symptoms are: unchanged.  Triage Disposition: See PCP Within 2 Weeks  Patient/caregiver understands and will follow disposition?: No, wishes to speak with PCP   Reason for Disposition  Back pain is a chronic symptom (recurrent or ongoing AND present > 4 weeks)  Answer Assessment - Initial Assessment Questions Flexaril refill to [facility] Declines appointment due to she doesn't feel she needs evaluation, back pain is not new-chronic with flairs of moderate-severe intensity , had ov 06/05/24, next ov is scheduled 11/02/24   1. ONSET: When did the pain begin? (e.g., minutes, hours, days)     Chronic-but worse today after sweeping and house work 2. LOCATION: Where does it hurt? (upper, mid or lower back)     Moves around  3. SEVERITY: How bad is the pain?  (e.g., Scale 1-10; mild, moderate, or severe)     Moderate-severe not preventing her from daily activities 4. PATTERN: Is the pain constant? (e.g., yes, no; constant, intermittent)      Constant  5. RADIATION: Does the pain shoot into your legs or somewhere else?     Denies  6. CAUSE:  What do you think is causing the back pain?      Age 89. BACK OVERUSE:  Any recent lifting of heavy objects, strenuous work or exercise?     Sweeping and house work 8. MEDICINES: What have you taken so far for the pain? (e.g., nothing, acetaminophen , NSAIDS)     Requesting Flexaril 9. NEUROLOGIC SYMPTOMS: Do you have any weakness, numbness, or problems with bowel/bladder control?     Denies  10. OTHER SYMPTOMS: Do you have any other symptoms? (e.g., fever, abdomen pain, burning with urination,  blood in urine)       Denies  Protocols used: Back Pain-A-AH  Copied from CRM #[id number]. Topic: Clinical - Red Word Triage >> Aug 21, 2024  8:41 AM Eva FALCON wrote: Red Word that prompted transfer to Nurse Triage: severe back pain, is having trouble to move around, requesting fill of muscle spasm medication.

## 2024-10-05 ENCOUNTER — Ambulatory Visit: Admitting: Podiatry

## 2024-11-02 ENCOUNTER — Ambulatory Visit: Admitting: Internal Medicine

## 2025-05-21 ENCOUNTER — Ambulatory Visit
